# Patient Record
Sex: Male | Born: 1935 | Race: White | Hispanic: No
Health system: Northeastern US, Academic
[De-identification: ages and names within clinical notes are randomized; demographics above are authoritative.]

## PROBLEM LIST (undated history)

## (undated) ENCOUNTER — Encounter

## (undated) DIAGNOSIS — C61 Malignant neoplasm of prostate: Secondary | ICD-10-CM

## (undated) DIAGNOSIS — N189 Chronic kidney disease, unspecified: Secondary | ICD-10-CM

## (undated) DIAGNOSIS — M1990 Unspecified osteoarthritis, unspecified site: Secondary | ICD-10-CM

## (undated) DIAGNOSIS — I499 Cardiac arrhythmia, unspecified: Secondary | ICD-10-CM

## (undated) DIAGNOSIS — C419 Malignant neoplasm of bone and articular cartilage, unspecified: Secondary | ICD-10-CM

## (undated) DIAGNOSIS — E785 Hyperlipidemia, unspecified: Secondary | ICD-10-CM

## (undated) DIAGNOSIS — I10 Essential (primary) hypertension: Secondary | ICD-10-CM

## (undated) DIAGNOSIS — R2681 Unsteadiness on feet: Secondary | ICD-10-CM

## (undated) DIAGNOSIS — I4891 Unspecified atrial fibrillation: Secondary | ICD-10-CM

## (undated) DIAGNOSIS — I429 Cardiomyopathy, unspecified: Secondary | ICD-10-CM

## (undated) HISTORY — DX: Cardiac arrhythmia, unspecified: I49.9

## (undated) HISTORY — DX: Unspecified atrial fibrillation: I48.91

## (undated) HISTORY — DX: Malignant neoplasm of bone and articular cartilage, unspecified: C41.9

## (undated) HISTORY — DX: Malignant neoplasm of prostate: C61

## (undated) HISTORY — DX: Unsteadiness on feet: R26.81

## (undated) HISTORY — DX: Cardiomyopathy, unspecified: I42.9

## (undated) HISTORY — PX: EYE SURGERY: SHX253

## (undated) HISTORY — PX: PROSTATE SURGERY: SHX751

## (undated) HISTORY — PX: CATARACT EXTRACTION: SUR2

## (undated) HISTORY — PX: WRIST FOREIGN BODY REMOVAL: SUR1120

## (undated) MED FILL — ENZALUTAMIDE 40 MG CAPSULE: 40 40 mg | ORAL | 30 days supply | Qty: 60 | Fill #0 | Status: CN

---

## 1995-03-11 DIAGNOSIS — C61 Malignant neoplasm of prostate: Secondary | ICD-10-CM

## 1995-03-11 HISTORY — DX: Malignant neoplasm of prostate: C61

## 2009-04-02 ENCOUNTER — Emergency Department (HOSPITAL_COMMUNITY): Admission: EM | Admit: 2009-04-02 | Discharge: 2009-04-02 | Payer: Self-pay | Admitting: Emergency Medicine

## 2010-05-27 LAB — DIFFERENTIAL
Eosinophils Absolute: 0 10*3/uL (ref 0.0–0.7)
Eosinophils Relative: 0 % (ref 0–5)
Lymphs Abs: 0.6 10*3/uL — ABNORMAL LOW (ref 0.7–4.0)
Monocytes Absolute: 0.5 10*3/uL (ref 0.1–1.0)

## 2010-05-27 LAB — BASIC METABOLIC PANEL
BUN: 19 mg/dL (ref 6–23)
Chloride: 98 mEq/L (ref 96–112)
Glucose, Bld: 104 mg/dL — ABNORMAL HIGH (ref 70–99)
Potassium: 3.7 mEq/L (ref 3.5–5.1)
Sodium: 137 mEq/L (ref 135–145)

## 2010-05-27 LAB — CBC
HCT: 49.6 % (ref 39.0–52.0)
Hemoglobin: 17.4 g/dL — ABNORMAL HIGH (ref 13.0–17.0)
MCV: 102.1 fL — ABNORMAL HIGH (ref 78.0–100.0)
Platelets: 172 10*3/uL (ref 150–400)
WBC: 9.1 10*3/uL (ref 4.0–10.5)

## 2010-05-27 LAB — URINALYSIS, ROUTINE W REFLEX MICROSCOPIC
Glucose, UA: NEGATIVE mg/dL
Ketones, ur: 40 mg/dL — AB
pH: 6 (ref 5.0–8.0)

## 2012-05-07 ENCOUNTER — Encounter (HOSPITAL_COMMUNITY): Payer: Self-pay | Admitting: Emergency Medicine

## 2012-05-07 ENCOUNTER — Emergency Department (HOSPITAL_COMMUNITY)
Admission: EM | Admit: 2012-05-07 | Discharge: 2012-05-07 | Disposition: A | Discharge status: Home / Self Care | Payer: Medicare Other | Attending: Emergency Medicine | Admitting: Emergency Medicine

## 2012-05-07 DIAGNOSIS — M545 Low back pain, unspecified: Secondary | ICD-10-CM | POA: Insufficient documentation

## 2012-05-07 DIAGNOSIS — G8929 Other chronic pain: Secondary | ICD-10-CM | POA: Insufficient documentation

## 2012-05-07 MED ORDER — HYDROCODONE-ACETAMINOPHEN 5-325 MG PO TABS
1.0000 | ORAL_TABLET | Freq: Once | ORAL | Status: AC
  Administered 2012-05-07: 1 via ORAL
  Filled 2012-05-07: qty 1

## 2012-05-07 MED ORDER — HYDROCODONE-ACETAMINOPHEN 5-325 MG PO TABS: 1.0000 | ORAL_TABLET | Freq: Two times a day (BID) | ORAL | Status: DC | PRN

## 2012-05-07 NOTE — ED Notes (Signed)
Pt has PCP in [place]. Has HX of "arthritis" in lower back and right hip which has gotten worse over past 3 days. States his PCP usually gives him Hydrocodone.

## 2012-05-07 NOTE — ED Provider Notes (Signed)
History     CSN: [id number]  Arrival date & time 05/07/12  1154   First MD Initiated Contact with Patient 05/07/12 1206      Chief Complaint  Patient presents with  . Back Pain    (Consider location/radiation/quality/duration/timing/severity/associated sxs/prior treatment) HPI Comments: Patient presents for left side LBP that has been worsening x 3 days. Patient states pain is nonradiating, aching and stabbing in quality, and worse with forward flexion. Patient states he is followed by a doctor in [place] regarding his pain who prescribes him vicodin to take as needed; patient brought the pill bottle with him to confirm this. Patient denies weakness, numbness and tingling in his lower extremities, saddle anesthesia, and loss of bowel or bladder function. Further denies recent trauma to the back.  The history is provided by the patient. No language interpreter was used.    History reviewed. No pertinent past medical history.  History reviewed. No pertinent past surgical history.  History reviewed. No pertinent family history.  History  Substance Use Topics  . Smoking status: Never Smoker   . Smokeless tobacco: Not on file  . Alcohol Use: Yes     Comment: occ     Review of Systems  Constitutional: Negative for fever.  Respiratory: Negative for shortness of breath.   Cardiovascular: Negative for chest pain.  Musculoskeletal: Positive for back pain. Negative for gait problem.  Skin: Negative for wound.  Neurological: Negative for weakness and numbness.  All other systems reviewed and are negative.    Allergies  Review of patient's allergies indicates no known allergies.  Home Medications   Current Outpatient Rx  Name  Route  Sig  Dispense  Refill  . HYDROcodone-acetaminophen (NORCO/VICODIN) 5-325 MG per tablet   Oral   Take 1 tablet by mouth every 12 (twelve) hours as needed for pain.   6 tablet   0     BP 132/72  Pulse 73  Temp(Src) 97.5 F (36.4 C) (Oral)   Resp 16  SpO2 96%  Physical Exam  Nursing note and vitals reviewed. Constitutional: He is oriented to person, place, and time. He appears well-developed and well-nourished. No distress.  HENT:  Head: Normocephalic and atraumatic.  Eyes: Conjunctivae are normal. No scleral icterus.  Neck: Normal range of motion.  Cardiovascular: Intact distal pulses.   Pulmonary/Chest: Effort normal.  Musculoskeletal: He exhibits tenderness. He exhibits no edema.       Right hip: Normal.       Left hip: Normal.       Thoracic back: Normal.       Lumbar back: He exhibits tenderness. He exhibits normal range of motion, no bony tenderness, no swelling, no edema, no deformity and no laceration.       Back:  Tenderness on palpation appreciated at sight indicated. No midline tenderness, step offs, or evidence of trauma appreciated.   Neurological: He is alert and oriented to person, place, and time. He has normal strength. He displays no atrophy. No sensory deficit. He exhibits normal muscle tone.  Skin: Skin is warm and dry. No rash noted. He is not diaphoretic. No erythema. No pallor.  Psychiatric: He has a normal mood and affect. His behavior is normal.    ED Course  Procedures (including critical care time)  Labs Reviewed - No data to display No results found.   1. Chronic back pain      MDM  Patient presents to ED with chronic low back pain that has been worse  over the last 3 days. Patient followed by doctor in [place] who prescribed Vicodin 60 tabs to take as needed for pain ever 6 hours; has run out of Rx and will have new Rx called into pharmacy when office re-opens on Monday.   Patient exhibits tenderness in his left lower back. No midline tenderness or findings which would warrant further work up with imaging. Will d/c patient with 6 tabs vicodin for pain to take as needed. Have discussed the risk associated with taking narcotic pain medication for chronic back pain and have recommended  follow up with PCP doctor who manages his back pain as soon as possible. Have verbalized that pain medication is not treating the underlying source of his back pain and strongly suggested further evaluation with PT for back muscle strengthening as well as water aerobics for overall muscle strengthening of the back. Patient verbalizes understanding and comfort with this plan.  Filed Vitals:   05/07/12 1159  BP: 132/72  Pulse: 73  Temp: 97.5 F (36.4 C)  TempSrc: Oral  Resp: 16  SpO2: 96%         Antony Madura, PA-C 05/10/12 1613

## 2012-05-07 NOTE — ED Notes (Signed)
Pt c/o right lower back pain chronic in nature that is worse over last three days; pt denies new injury

## 2012-05-12 NOTE — ED Provider Notes (Signed)
Medical screening examination/treatment/procedure(s) were performed by non-physician practitioner and as supervising physician I was immediately available for consultation/collaboration.  Christopher J. Pollina, MD 05/12/12 0807 

## 2014-01-23 ENCOUNTER — Ambulatory Visit: Payer: Medicare Other | Admitting: Family Medicine

## 2015-02-13 ENCOUNTER — Emergency Department (HOSPITAL_COMMUNITY)
Admission: EM | Admit: 2015-02-13 | Discharge: 2015-02-13 | Disposition: A | Discharge status: Home / Self Care | Payer: Medicare Other | Attending: Emergency Medicine | Admitting: Emergency Medicine

## 2015-02-13 ENCOUNTER — Encounter (HOSPITAL_COMMUNITY): Payer: Self-pay

## 2015-02-13 DIAGNOSIS — B029 Zoster without complications: Secondary | ICD-10-CM | POA: Insufficient documentation

## 2015-02-13 DIAGNOSIS — G510 Bell's palsy: Secondary | ICD-10-CM | POA: Diagnosis not present

## 2015-02-13 DIAGNOSIS — H9201 Otalgia, right ear: Secondary | ICD-10-CM | POA: Diagnosis not present

## 2015-02-13 DIAGNOSIS — B0221 Postherpetic geniculate ganglionitis: Secondary | ICD-10-CM | POA: Insufficient documentation

## 2015-02-13 DIAGNOSIS — Z8546 Personal history of malignant neoplasm of prostate: Secondary | ICD-10-CM | POA: Insufficient documentation

## 2015-02-13 DIAGNOSIS — Z8639 Personal history of other endocrine, nutritional and metabolic disease: Secondary | ICD-10-CM | POA: Diagnosis not present

## 2015-02-13 DIAGNOSIS — R220 Localized swelling, mass and lump, head: Secondary | ICD-10-CM | POA: Diagnosis present

## 2015-02-13 DIAGNOSIS — M1990 Unspecified osteoarthritis, unspecified site: Secondary | ICD-10-CM | POA: Insufficient documentation

## 2015-02-13 HISTORY — DX: Hyperlipidemia, unspecified: E78.5

## 2015-02-13 HISTORY — DX: Malignant neoplasm of prostate: C61

## 2015-02-13 HISTORY — DX: Unspecified osteoarthritis, unspecified site: M19.90

## 2015-02-13 MED ORDER — PREDNISONE 10 MG (21) PO TBPK: 10.0000 mg | ORAL_TABLET | Freq: Every day | ORAL | Status: DC

## 2015-02-13 MED ORDER — PREDNISONE 20 MG PO TABS
60.0000 mg | ORAL_TABLET | Freq: Once | ORAL | Status: AC
Start: 2015-02-13 — End: 2015-02-13
  Administered 2015-02-13: 60 mg via ORAL
  Filled 2015-02-13: qty 3

## 2015-02-13 MED ORDER — SULFAMETHOXAZOLE-TRIMETHOPRIM 800-160 MG PO TABS: 1.0000 | ORAL_TABLET | Freq: Two times a day (BID) | ORAL | Status: DC

## 2015-02-13 MED ORDER — VALACYCLOVIR HCL 1 G PO TABS: 1000.0000 mg | ORAL_TABLET | Freq: Three times a day (TID) | ORAL | Status: DC

## 2015-02-13 MED ORDER — TRAMADOL HCL 50 MG PO TABS: 50.0000 mg | ORAL_TABLET | Freq: Four times a day (QID) | ORAL | Status: DC | PRN

## 2015-02-13 MED ORDER — SULFAMETHOXAZOLE-TRIMETHOPRIM 800-160 MG PO TABS: 1.0000 | ORAL_TABLET | Freq: Once | ORAL | Status: DC

## 2015-02-13 MED ORDER — VALACYCLOVIR HCL 500 MG PO TABS
1000.0000 mg | ORAL_TABLET | Freq: Once | ORAL | Status: AC
  Administered 2015-02-13: 1000 mg via ORAL
  Filled 2015-02-13: qty 2

## 2015-02-13 NOTE — ED Provider Notes (Signed)
Medical screening examination/treatment/procedure(s) were conducted as a shared visit with non-physician practitioner(s) and myself.  I personally evaluated the patient during the encounter.   EKG Interpretation None       See the written copy of this report in the patient's paper medical record.  These results did not interface directly into the electronic medical record and are summarized here.  81 yo M with a chief complaints of a painful rash to the right side of his face. This been going on for the past 48 hours. Patient denies any injury to the area. On exam patient has a vesicular rash in a dermatomal distribution. There is a rash extends into his ear. 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 Patient also has Bell's palsy on the right. Will start acyclovir steroids. D/c home.   Deno Etienne, DO 02/13/15 2335

## 2015-02-13 NOTE — Discharge Instructions (Signed)
Shingles Shingles, which is also known as herpes zoster, is an infection that causes a painful skin rash and fluid-filled blisters. Shingles is not related to genital herpes, which is a sexually transmitted infection.   Shingles only develops in people who:  Have had chickenpox.  Have received the chickenpox vaccine. (This is rare.) CAUSES Shingles is caused by varicella-zoster virus (VZV). This is the same virus that causes chickenpox. After exposure to VZV, the virus stays in the body in an inactive (dormant) state. Shingles develops if the virus reactivates. This can happen many years after the initial exposure to VZV. It is not known what causes this virus to reactivate. RISK FACTORS People who have had chickenpox or received the chickenpox vaccine are at risk for shingles. Infection is more common in people who:  Are older than age 44.  Have a weakened defense (immune) system, such as those with HIV, AIDS, or cancer.  Are taking medicines that weaken the immune system, such as transplant medicines.  Are under great stress. SYMPTOMS Early symptoms of this condition include itching, tingling, and pain in an area on your skin. Pain may be described as burning, stabbing, or throbbing. A few days or weeks after symptoms start, a painful red rash appears, usually on one side of the body in a bandlike or beltlike pattern. The rash eventually turns into fluid-filled blisters that break open, scab over, and dry up in about 2-3 weeks. At any time during the infection, you may also develop:  A fever.  Chills.  A headache.  An upset stomach. DIAGNOSIS This condition is diagnosed with a skin exam. Sometimes, skin or fluid samples are taken from the blisters before a diagnosis is made. These samples are examined under a microscope or sent to a lab for testing. TREATMENT There is no specific cure for this condition. Your health care provider will probably prescribe medicines to help you  manage pain, recover more quickly, and avoid long-term problems. Medicines may include:  Antiviral drugs.  Anti-inflammatory drugs.  Pain medicines. If the area involved is on your face, you may be referred to a specialist, such as an eye doctor (ophthalmologist) or an ear, nose, and throat (ENT) doctor to help you avoid eye problems, chronic pain, or disability. HOME CARE INSTRUCTIONS Medicines  Take medicines only as directed by your health care provider.  Apply an anti-itch or numbing cream to the affected area as directed by your health care provider. Blister and Rash Care  Take a cool bath or apply cool compresses to the area of the rash or blisters as directed by your health care provider. This may help with pain and itching.  Keep your rash covered with a loose bandage (dressing). Wear loose-fitting clothing to help ease the pain of material rubbing against the rash.  Keep your rash and blisters clean with mild soap and cool water or as directed by your health care provider.  Check your rash every day for signs of infection. These include redness, swelling, and pain that lasts or increases.  Do not pick your blisters.  Do not scratch your rash. General Instructions  Rest as directed by your health care provider.  Keep all follow-up visits as directed by your health care provider. This is important.  Until your blisters scab over, your infection can cause chickenpox in people who have never had it or been vaccinated against it. To prevent this from happening, avoid contact with other people, especially:  Babies.  Pregnant women.  Children who have eczema.  Elderly people who have transplants.  People who have chronic illnesses, such as leukemia or AIDS. SEEK MEDICAL CARE IF:  Your pain is not relieved with prescribed medicines.  Your pain does not get better after the rash heals.  Your rash looks infected. Signs of infection include redness, swelling, and pain  that lasts or increases. SEEK IMMEDIATE MEDICAL CARE IF:  The rash is on your face or nose.  You have facial pain, pain around your eye area, or loss of feeling on one side of your face.  You have ear pain or you have ringing in your ear.  You have loss of taste.  Your condition gets worse.   This information is not intended to replace advice given to you by your health care provider. Make sure you discuss any questions you have with your health care provider.   Document Released: 02/24/2005 Document Revised: 03/17/2014 Document Reviewed: 01/05/2014 Elsevier Interactive Patient Education 2016 [employer]. Emergency Department Resource Guide 1) Find a Doctor and Pay Out of Pocket Although you won't have to find out who is covered by your insurance plan, it is a good idea to ask around and get recommendations. You will then need to call the office and see if the doctor you have chosen will accept you as a new patient and what types of options they offer for patients who are self-pay. Some doctors offer discounts or will set up payment plans for their patients who do not have insurance, but you will need to ask so you aren't surprised when you get to your appointment.  2) Contact Your Local Health Department Not all health departments have doctors that can see patients for sick visits, but many do, so it is worth a call to see if yours does. If you don't know where your local health department is, you can check in your phone book. The CDC also has a tool to help you locate your state's health department, and many state websites also have listings of all of their local health departments.  3) Find a [facility] If your illness is not likely to be very severe or complicated, you may want to try a walk in clinic. These are popping up all over the country in pharmacies, drugstores, and shopping centers. They're usually staffed by nurse practitioners or physician assistants that have been  trained to treat common illnesses and complaints. They're usually fairly quick and inexpensive. However, if you have serious medical issues or chronic medical problems, these are probably not your best option.  No Primary Care Doctor: - Call Health Connect at  [phone] - they can help you locate a primary care doctor that  accepts your insurance, provides certain services, etc. - Physician Referral Service- [phone]  Chronic Pain Problems: Organization         Address  Phone   Notes  [facility]  [phone] Patients need to be referred by their primary care doctor.   Medication Assistance: Organization         Address  Phone   Notes  [facility] Medication [facility] [address] [place], [place] [address] [phone] --Must be a resident of [facility] -- Must have NO insurance coverage whatsoever (no Medicaid/ Medicare, etc.) -- The pt. MUST have a primary care doctor that directs their care regularly and follows them in the community   MedAssist  [phone]   [employer]  [phone])  [id number]    Agencies that provide inexpensive medical care: Organization         Address  Phone   Notes  [place]  [phone]   [facility]    [phone]   [facility] [place], [place] [address] [phone]   [place] [address] [place] [phone]   Planned Parenthood    [phone]   [facility]    [phone]   [place] and [place] [address], [place] Phone:  [phone], Fax:  [phone] Hours of Operation:  9 am - 6 pm, M-F.  Also accepts Medicaid/Medicare and self-pay.  [facility] for [place], [address], [place] Phone: [phone], Fax: [phone]. Hours of Operation:  8:30 am - 5:30 pm, M-F.  Also accepts Medicaid and self-pay.   [facility] [place] [address] [place] Phone: [phone]   [place], [place], [place] [phone], Ext. [phone] Mondays & Thursdays: 7-9 AM.  First 15 patients are seen on a first come, first serve basis.    [place] Providers:  Organization         Address  Phone   Notes  [facility] [address], [place] [phone] Also accepts self-pay patients.  [facility] [id number] [place], [place]  [phone]   [place], [address], [place] [phone]   [facility] [address], [place] [phone]   Lucianne Lei [address], [place]   [phone] Only accepts [place] Access [place] patients after they have their name applied to their card.   Self-Pay (no insurance) in [facility]:  Organization         Address  Phone   Notes  Sickle Cell Patients, [facility] [place] [phone]   [facility] [place] [phone]   [facility] [place]  [place], [place], [place] [phone]   [facility]/Dr. Osei-Bonsu  [address] [place] or [address], [place] [phone] Phone number for both [place] and [place] locations is the same.  Urgent Medical and [facility] [place] [address], [place] [phone]   [facility] [address], [place] or [address] [phone]   [facility] [address], [place] [phone], phone; [phone], fax Sees patients 1st and 3rd Saturday of every month.  Must not qualify for public or private insurance (i.e. Medicaid, Medicare, [place] Health Choice, Veterans' Benefits)  Household income should be no  more than 200% of the poverty level The clinic cannot treat you if you are pregnant or think you are pregnant  Sexually transmitted diseases are not treated at the clinic.    Dental Care: Organization         Address  Phone  Notes  [facility] Department of [facility] [name] [phone] Accepts children up to age 3 who are enrolled in [place] or [place]; pregnant women with a Medicaid card; and children who have  applied for Medicaid or [place] Health Choice, but were declined, whose parents can pay a reduced fee at time of service.  [facility] : [facility] Department of [facility]  [address], [place] [phone] Accepts children up to age 50 who are enrolled in [place] or [place]; pregnant women with a Medicaid card; and children who have applied for Medicaid or [place] Health Choice, but were declined, whose parents can pay a reduced fee at time of service.  [place] Adult Dental Access PROGRAM  [facility] [phone] Patients are seen by appointment only. Walk-ins are not accepted. [facility] will see patients 34 years of age and older. Monday - Tuesday (8am-5pm) Most Wednesdays (8:30-5pm) $30 per visit, cash only  [facility] Adult Dental Access PROGRAM  [address], [facility] [phone] Patients are seen by appointment only. Walk-ins are not accepted. [place] will see patients 28 years of age and older. One Wednesday Evening (Monthly: Volunteer Based).  $30 per visit, cash only  [place]  [phone] for adults; Children under age 7, call Graduate Pediatric Dentistry at [phone]. Children aged 42-14, please call [phone] to request a pediatric application.  Dental services are provided in all areas of dental care including fillings, crowns and bridges, complete and partial dentures, implants, gum treatment, root canals,  and extractions. Preventive care is also provided. Treatment is provided to both adults and children. Patients are selected via a lottery and there is often a waiting list.   [facility] [address], [place]  [phone] [email or web address]   [facility] [address] [place], [place] [phone], Ext. [phone] Second and Fourth Thursday of each month, opens at 6:30 AM; Clinic ends at 9 AM.  Patients are seen on a first-come first-served basis, and a limited number are seen during each clinic.   [facility]  [address] [place], [place] [phone]   Eligibility Requirements You must have lived in [place], [place], or [place] counties for at least the last three months.   You cannot be eligible for state or federal sponsored [employer], including [employer], [place], or [employer].   You generally cannot be eligible for healthcare insurance through your employer.    How to apply: Eligibility screenings are held every Tuesday and Wednesday afternoon from 1:00 pm until 4:00 pm. You do not need an appointment for the interview!  [facility] [address] [place], [place]   [place]  [place] Department  [place]  [phone]    Behavioral Health Resources in the Community: Intensive Outpatient Programs Organization         Address  Phone  Notes  [name]. [address], [place], [place] [phone]   [facility] Outpatient [address] [place], [place]   ADS: Alcohol & Drug Svcs [address] [place], [place]   [place] [address]  [place], [place] or [phone]   Substance Abuse Resources Organization         Address  Phone  Notes  Alcohol and Drug Services  [place]  [phone]   [facility] [place]   Chinita Pester  [phone]   Residential & Outpatient Substance  Abuse Program  [phone]   Psychological Services Organization         Address  Phone  Notes  [place]  [place]  [phone]   [place] [address], [place] or [phone]    Mobile Crisis Teams Organization         Address  Phone  Notes  [facility], Mobile Crisis Care Unit  [phone]   [facility] Psychotherapeutic Services  [address] [place], [place]   [place] Levels [address], [place] [phone]    Self-Help/Support Groups Organization         Address  Phone             Notes  [place]. of [place] - variety of support groups  [place] Call for more information  Narcotics Anonymous (NA), [facility] [address], [employer] [place]  2 meetings at this location   Special educational needs teacher         Address  Phone  Notes  ASAP Residential Treatment [place],    [place]  1-6200080474   [facility]  [address], [place] [id number], [place], [place]   [place], [place] [phone] Admissions: 8am-3pm M-F  Incentives Substance [place] [address],    [place], [place] [phone]   [facility] [address] [place], [place], [place]   [facility] [address]  [place], [place]   Insight Programs - Intensive Outpatient [address], [place], [place]   [facility] ([place].) [place].,  [place], [place] [phone] or [phone]   [facility]idential Treatment Services (RTS) [address], [place], [place] Accepts Medicaid  [facility] [place] [address]    [place] [phone] Substance Abuse/Addiction Treatment   [facility] Organization         Address  Phone  Notes  [facility]  [phone]   Domenic Schwab, PhD [address] [place], [place]   [phone] or [phone]   [place], [place] [phone]   [facility] [address], [place], [place] [phone] Insurance/Medicaid/sponsorship through [facility] and Families [address], [address]                                    [place], [place] [phone] [place] [address] [place], [place] [phone]    Dr. Adele Schilder  [phone]   [facility] Dept. 1) [address] [place] 2) [place] 3)  [place] [address], [place] [phone]  [phone]   [place] [phone] or [phone] (After Hours)

## 2015-02-13 NOTE — ED Notes (Signed)
Pt c/o pain and swelling to upper lip and right ear pain

## 2015-02-13 NOTE — ED Notes (Signed)
Pt c/o infection to right side of nose x 2 days. Swelling and redness noted above r side of upper lip

## 2015-02-13 NOTE — ED Provider Notes (Signed)
CSN: [id number]     Arrival date & time 02/13/15  1656 History  By signing my name below, I, Hansel Feinstein, attest that this documentation has been prepared under the direction and in the presence of  [employer], PA-C. Electronically Signed: Hansel Feinstein, ED Scribe. 02/13/2015. 6:41 PM.     Chief Complaint  Patient presents with  . Wound Infection    The history is provided by the patient. No language interpreter was used.   HPI Comments: Ved Mackellar is a 81 y.o. male with h/o HLD, prostate cancer who presents to the Emergency Department complaining of an area of redness, swelling and pain proximal to the right side of the nose and right upper lip onset 2 days ago. He states associated right-sided otalgia. Pt denies taking OTC medications at home to improve symptoms. No Hx of similar symptoms to the face. No h/o DM. NKDA. He denies fever, drainage from the affected area.  Denies any difference in taste.  Past Medical History  Diagnosis Date  . Cancer ([place])   . Prostate cancer ([place])   . Hyperlipidemia   . Arthritis   . Prostate cancer [facility])    Past Surgical History  Procedure Laterality Date  . Cataract extraction     History reviewed. No pertinent family history. Social History  Substance Use Topics  . Smoking status: Never Smoker   . Smokeless tobacco: None  . Alcohol Use: No     Comment: occ    Review of Systems  Constitutional: Negative for fever.  HENT: Positive for ear pain.   Skin: Positive for color change.       Area of pain, redness and swelling to the right upper lip without drainage   Allergies  Review of patient's allergies indicates no known allergies.  Home Medications   Prior to Admission medications   Medication Sig Start Date End Date Taking? Authorizing Provider  HYDROcodone-acetaminophen (NORCO/VICODIN) 5-325 MG per tablet Take 1 tablet by mouth every 12 (twelve) hours as needed for pain. 05/07/12   Antonietta Breach, PA-C  predniSONE (STERAPRED  UNI-PAK 21 TAB) 10 MG (21) TBPK tablet Take 1 tablet (10 mg total) by mouth daily. Take 6 tabs by mouth daily  for 2 days, then 5 tabs for 2 days, then 4 tabs for 2 days, then 3 tabs for 2 days, 2 tabs for 2 days, then 1 tab by mouth daily for 2 days 02/13/15   Ottie Glazier, PA-C  traMADol (ULTRAM) 50 MG tablet Take 1 tablet (50 mg total) by mouth every 6 (six) hours as needed. 02/13/15   Tashayla Therien Patel-Mills, PA-C  valACYclovir (VALTREX) 1000 MG tablet Take 1 tablet (1,000 mg total) by mouth 3 (three) times daily. 02/13/15   Kennie Snedden Patel-Mills, PA-C   BP 153/70 mmHg  Pulse 68  Temp(Src) 98.2 F (36.8 C) (Oral)  Resp 18  Ht 5\' 7"  (1.702 m)  Wt 79.606 kg  BMI 27.48 kg/m2  SpO2 99% Physical Exam  Constitutional: He is oriented to person, place, and time. He appears well-developed and well-nourished.  HENT:  Head: Normocephalic and atraumatic.  Right Ear: Hearing, tympanic membrane, external ear and ear canal normal. Tympanic membrane is not erythematous. No hemotympanum.  Left Ear: Hearing, tympanic membrane, external ear and ear canal normal. Tympanic membrane is not erythematous. No hemotympanum.  Mouth/Throat: Uvula is midline, oropharynx is clear and moist and mucous membranes are normal.  Wearing dentures. No gum swelling.  Eyes: Conjunctivae and EOM are normal. Pupils are equal,  round, and reactive to light.  Neck: Normal range of motion. Neck supple.  Cardiovascular: Normal rate.   Pulmonary/Chest: Effort normal. No respiratory distress.  Abdominal: He exhibits no distension.  Musculoskeletal: Normal range of motion.  Neurological: He is alert and oriented to person, place, and time. He has normal strength. No sensory deficit. GCS eye subscore is 4. GCS verbal subscore is 5. GCS motor subscore is 6.  Ambulatory with steady gait. No sensory deficit. No motor deficit. Normal coordination. GCS 15. Right-sided facial palsy. No slurred speech.  Skin: Skin is warm and dry.  Dermatomal  vesicular rash along the right upper lip toward the right ear. No lesions noted within the year or on the pinna. 1 crusted yellow vesicle above the right upper lip.  Psychiatric: He has a normal mood and affect. His behavior is normal.  Nursing note and vitals reviewed.  ED Course  Procedures (including critical care time) DIAGNOSTIC STUDIES: Oxygen Saturation is 96% on RA, adequate by my interpretation.    COORDINATION OF CARE: 6:37 PM Discussed treatment plan with pt at bedside and pt agreed to plan. Plan to treat with antibiotics and steroids. I spoke to Dr. Tyrone Nine who has seen and evaluated the patient. MDM   Final diagnoses:  Shingles rash  Bell's palsy   Patient with swelling and yellow crusting to his upper lip without surrounding erythema. This is most consistent with shingles. I do not believe this is a stroke. Supportive care and return precautions discussed.  Pt sent home with Valtrex, Tramadol and Prednisone. The patient appears reasonably screened and/or stabilized for discharge and I doubt any other emergent medical condition requiring further screening, evaluation, or treatment in the ED prior to discharge.    Filed Vitals:   02/13/15 1725 02/13/15 1910  BP: 151/96 153/70  Pulse: 69 68  Temp: 97.7 F (36.5 C) 98.2 F (36.8 C)  Resp: 16 18    Medications  valACYclovir (VALTREX) tablet 1,000 mg (1,000 mg Oral Given 02/13/15 1947)  predniSONE (DELTASONE) tablet 60 mg (60 mg Oral Given 02/13/15 1921)    Discharge Medication List as of 02/13/2015  7:12 PM    START taking these medications   Details  predniSONE (STERAPRED UNI-PAK 21 TAB) 10 MG (21) TBPK tablet Take 1 tablet (10 mg total) by mouth daily. Take 6 tabs by mouth daily  for 2 days, then 5 tabs for 2 days, then 4 tabs for 2 days, then 3 tabs for 2 days, 2 tabs for 2 days, then 1 tab by mouth daily for 2 days, Starting 02/13/2015, Until Discontinued, P rint    traMADol (ULTRAM) 50 MG tablet Take 1 tablet (50 mg  total) by mouth every 6 (six) hours as needed., Starting 02/13/2015, Until Discontinued, Print    valACYclovir (VALTREX) 1000 MG tablet Take 1 tablet (1,000 mg total) by mouth 3 (three) times daily., Starting 02/13/2015, Until Discontinued, Print       I personally performed the services described in this documentation, which was scribed in my presence. The recorded information has been reviewed and is accurate.   Ottie Glazier, PA-C 02/13/15 [place], DO 02/13/15 2335

## 2015-03-01 ENCOUNTER — Ambulatory Visit (INDEPENDENT_AMBULATORY_CARE_PROVIDER_SITE_OTHER): Payer: Medicare Other | Admitting: Internal Medicine

## 2015-03-01 ENCOUNTER — Encounter: Payer: Self-pay | Admitting: Internal Medicine

## 2015-03-01 VITALS — BP 132/78 | HR 76 | Temp 97.7°F | Resp 16 | Ht 67.0 in | Wt 177.8 lb

## 2015-03-01 DIAGNOSIS — K219 Gastro-esophageal reflux disease without esophagitis: Secondary | ICD-10-CM

## 2015-03-01 DIAGNOSIS — R7309 Other abnormal glucose: Secondary | ICD-10-CM

## 2015-03-01 DIAGNOSIS — E782 Mixed hyperlipidemia: Secondary | ICD-10-CM | POA: Insufficient documentation

## 2015-03-01 DIAGNOSIS — R739 Hyperglycemia, unspecified: Secondary | ICD-10-CM

## 2015-03-01 DIAGNOSIS — R7303 Prediabetes: Secondary | ICD-10-CM | POA: Insufficient documentation

## 2015-03-01 DIAGNOSIS — Z79899 Other long term (current) drug therapy: Secondary | ICD-10-CM | POA: Insufficient documentation

## 2015-03-01 DIAGNOSIS — E559 Vitamin D deficiency, unspecified: Secondary | ICD-10-CM | POA: Insufficient documentation

## 2015-03-01 DIAGNOSIS — I10 Essential (primary) hypertension: Secondary | ICD-10-CM

## 2015-03-01 LAB — BASIC METABOLIC PANEL WITH GFR
BUN: 27 mg/dL — AB (ref 7–25)
CHLORIDE: 97 mmol/L — AB (ref 98–110)
CO2: 28 mmol/L (ref 20–31)
Calcium: 9.3 mg/dL (ref 8.6–10.3)
Creat: 1.24 mg/dL — ABNORMAL HIGH (ref 0.70–1.11)
GFR, EST AFRICAN AMERICAN: 63 mL/min (ref >=60)
GFR, EST NON AFRICAN AMERICAN: 54 mL/min — AB (ref >=60)
GLUCOSE: 128 mg/dL — AB (ref 65–99)
POTASSIUM: 3.9 mmol/L (ref 3.5–5.3)
Sodium: 135 mmol/L (ref 135–146)

## 2015-03-01 LAB — HEPATIC FUNCTION PANEL
ALBUMIN: 3.6 g/dL (ref 3.6–5.1)
ALK PHOS: 66 U/L (ref 40–115)
ALT: 68 U/L — ABNORMAL HIGH (ref 9–46)
AST: 35 U/L (ref 10–35)
BILIRUBIN INDIRECT: 0.7 mg/dL (ref 0.2–1.2)
BILIRUBIN TOTAL: 0.9 mg/dL (ref 0.2–1.2)
Bilirubin, Direct: 0.2 mg/dL (ref <=0.2)
TOTAL PROTEIN: 6.2 g/dL (ref 6.1–8.1)

## 2015-03-01 LAB — LIPID PANEL
Cholesterol: 178 mg/dL (ref 125–200)
HDL: 47 mg/dL (ref >=40)
TRIGLYCERIDES: 584 mg/dL — AB (ref <150)
Total CHOL/HDL Ratio: 3.8 Ratio (ref <=5.0)

## 2015-03-01 LAB — CBC WITH DIFFERENTIAL/PLATELET
BASOS ABS: 0 10*3/uL (ref 0.0–0.1)
Basophils Relative: 0 % (ref 0–1)
Eosinophils Absolute: 0.1 10*3/uL (ref 0.0–0.7)
Eosinophils Relative: 1 % (ref 0–5)
HEMATOCRIT: 42.1 % (ref 39.0–52.0)
HEMOGLOBIN: 14.7 g/dL (ref 13.0–17.0)
LYMPHS ABS: 1.4 10*3/uL (ref 0.7–4.0)
LYMPHS PCT: 15 % (ref 12–46)
MCH: 34.3 pg — ABNORMAL HIGH (ref 26.0–34.0)
MCHC: 34.9 g/dL (ref 30.0–36.0)
MCV: 98.1 fL (ref 78.0–100.0)
MPV: 9.8 fL (ref 8.6–12.4)
Monocytes Absolute: 0.9 10*3/uL (ref 0.1–1.0)
Monocytes Relative: 10 % (ref 3–12)
NEUTROS ABS: 6.7 10*3/uL (ref 1.7–7.7)
NEUTROS PCT: 74 % (ref 43–77)
Platelets: 147 10*3/uL — ABNORMAL LOW (ref 150–400)
RBC: 4.29 MIL/uL (ref 4.22–5.81)
RDW: 15.2 % (ref 11.5–15.5)
WBC: 9.1 10*3/uL (ref 4.0–10.5)

## 2015-03-01 LAB — MAGNESIUM: Magnesium: 2.1 mg/dL (ref 1.5–2.5)

## 2015-03-01 NOTE — Patient Instructions (Signed)

## 2015-03-02 LAB — HEMOGLOBIN A1C
Hgb A1c MFr Bld: 6.3 % — ABNORMAL HIGH (ref <5.7)
Mean Plasma Glucose: 134 mg/dL — ABNORMAL HIGH (ref <117)

## 2015-03-02 LAB — VITAMIN D 25 HYDROXY (VIT D DEFICIENCY, FRACTURES): Vit D, 25-Hydroxy: 69 ng/mL (ref 30–100)

## 2015-03-02 LAB — INSULIN, RANDOM: Insulin: 32.7 u[IU]/mL — ABNORMAL HIGH (ref 2.0–19.6)

## 2015-03-02 LAB — TSH: TSH: 1.711 u[IU]/mL (ref 0.350–4.500)

## 2015-03-05 ENCOUNTER — Encounter: Payer: Self-pay | Admitting: Internal Medicine

## 2015-03-05 NOTE — Progress Notes (Signed)
Patient ID: Logan Brown, male   DOB: 23-Apr-1931, 81 y.o.   MRN: [id number]     This very nice 81 y.o. DWM from [place] who who for the last 9-10 years resides in [place] about 6 months during the winter, but has never established medical care here.  He presents with a hx/o Hypertension, Hyperlipidemia, Pre-Diabetes and Vitamin D Deficiency. He also reports hx consistent with GERD usually triggered by dietary indiscretions and controlled with OTC antacids. He reports his PCP in [place] is a Dr Loyce Dys. Patient relates hx/o B CE/IOL and has refused Colonoscopy in the past     Patient also was dx'd & tx'd on Dec 6 at [facility] ER with R face Shingles/Ramsey Hunt Syndrome and appears to already recovered his R Bell's Palsy.      Patient relates hx/o labile HTN monitored since 2005 & BP has been controlled and today's BP: 132/78 mmHg. Patient has had no complaints of any cardiac type chest pain, palpitations, dyspnea/orthopnea/PND, dizziness, claudication, or dependent edema.     He also admits hx/o Hyperlipidemia, but is uncertain if it's been controlled with diet.  Current Lipids were are at goal with Cholesterol 178; HDL 47; LDL NOT CALC; but very elevated Triglycerides 584.     Also, the patient is uncertain if he's had hx/o elevated glucoses, but denies symptoms of reactive hypoglycemia, diabetic polys, paresthesias or visual blurring. Current A1c is elevated at 6.3% consistent with PreDiabetes.     Further, the patient has current vitamin D at goal with a level of 69.     Medication Sig  . HYDROcodone-acetaminophen (NORCO/VICODIN) 5-325 MG per tablet Take 1 tablet by mouth every 12 (twelve) hours as needed for pain.  . predniSONE (STERAPRED UNI-PAK 21 TAB) 10 MG (21) TBPK tablet Take 1 tablet (10 mg total) by mouth daily. Take 6 tabs by mouth daily  for 2 days, then 5 tabs for 2 days, then 4 tabs for 2 days, then 3 tabs for 2 days, 2 tabs for 2 days, then 1 tab by mouth daily for 2  days  . traMADol (ULTRAM) 50 MG tablet Take 1 tablet (50 mg total) by mouth every 6 (six) hours as needed.  . valACYclovir (VALTREX) 1000 MG tablet Take 1 tablet (1,000 mg total) by mouth 3 (three) times daily.   No Known Allergies  PMHx:   Past Medical History  Diagnosis Date  . Cancer ([place])   . Prostate cancer ([place])   . Hyperlipidemia   . Arthritis   . Prostate cancer [facility])    Past Surgical History  Procedure Laterality Date  . Cataract extraction     FHx:    Reviewed / unchanged  SHx:    Reviewed / unchanged  Systems Review:  Constitutional: Denies fever, chills, wt changes, headaches, insomnia, fatigue, night sweats, change in appetite. Eyes: Denies redness, blurred vision, diplopia, discharge, itchy, watery eyes.  ENT: Denies discharge, congestion, post nasal drip, epistaxis, sore throat, earache, hearing loss, dental pain, tinnitus, vertigo, sinus pain, snoring.  CV: Denies chest pain, palpitations, irregular heartbeat, syncope, dyspnea, diaphoresis, orthopnea, PND, claudication or edema. Respiratory: denies cough, dyspnea, DOE, pleurisy, hoarseness, laryngitis, wheezing.  Gastrointestinal: Denies dysphagia, odynophagia, heartburn, reflux, water brash, abdominal pain or cramps, nausea, vomiting, bloating, diarrhea, constipation, hematemesis, melena, hematochezia  or hemorrhoids. Genitourinary: Denies dysuria, frequency, urgency, nocturia, hesitancy, discharge, hematuria or flank pain. Musculoskeletal: Denies arthralgias, myalgias, stiffness, jt. swelling, pain, limping or strain/sprain.  Skin: Denies pruritus, rash, hives, warts, acne, eczema  or change in skin lesion(s). Neuro: No weakness, tremor, incoordination, spasms, paresthesia or pain. Psychiatric: Denies confusion, memory loss or sensory loss. Endo: Denies change in weight, skin or hair change.  Heme/Lymph: No excessive bleeding, bruising or enlarged lymph nodes.  Physical Exam  BP 132/78 mmHg  Pulse 76   Temp(Src) 97.7 F (36.5 C)  Resp 16  Ht 5\' 7"  (1.702 m)  Wt 177 lb 12.8 oz (80.65 kg)  BMI 27.84 kg/m2  Appears well nourished and in no distress. Eyes: PERRLA, EOMs, conjunctiva no swelling or erythema. Sinuses: No frontal/maxillary tenderness ENT/Mouth: EAC's clear, TM's nl w/o erythema, bulging. Nares clear w/o erythema, swelling, exudates. Oropharynx clear without erythema or exudates. Oral hygiene is good. Tongue normal, non obstructing. Hearing intact.  Neck: Supple. Thyroid nl. Car 2+/2+ without bruits, nodes or JVD. Chest: Respirations nl with BS clear & equal w/o rales, rhonchi, wheezing or stridor.  Cor: Heart sounds normal w/ regular rate and rhythm without sig. murmurs, gallops, clicks, or rubs. Peripheral pulses normal and equal  without edema.  Abdomen: Soft & bowel sounds normal. Non-tender w/o guarding, rebound, hernias, masses, or organomegaly.  Lymphatics: Unremarkable.  Musculoskeletal: Full ROM all peripheral extremities, joint stability, 5/5 strength, and normal gait.  Skin: Warm, dry without exposed rashes, lesions or ecchymosis apparent.  Neuro: Cranial nerves intact, reflexes equal bilaterally. Sensory-motor testing grossly intact. Tendon reflexes grossly intact.  Pysch: Alert & oriented x 3.  Insight and judgement nl & appropriate. No ideations.  Assessment and Plan:  1. Essential hypertension  - TSH  2. Mixed hyperlipidemia  - Lipid panel - TSH  3. Elevated blood sugar  - Hemoglobin A1c - Insulin, random  4. Vitamin D deficiency  - VITAMIN D 25 Hydroxy   5. Gastroesophageal reflux disease   6. Medication management  - CBC with Differential/Platelet - BASIC METABOLIC PANEL WITH GFR - Hepatic function panel - Magnesium   Recommended regular exercise, BP monitoring, weight control, and discussed med and SE's. Recommended labs to assess and monitor clinical status. Further disposition pending results of labs. Over 30 minutes of exam,  counseling, chart review was performed

## 2015-03-14 ENCOUNTER — Encounter: Payer: Self-pay | Admitting: Internal Medicine

## 2015-03-14 ENCOUNTER — Ambulatory Visit (INDEPENDENT_AMBULATORY_CARE_PROVIDER_SITE_OTHER): Payer: Medicare Other | Admitting: Internal Medicine

## 2015-03-14 VITALS — BP 124/66 | HR 78 | Temp 98.2°F | Resp 16 | Ht 67.0 in | Wt 175.0 lb

## 2015-03-14 DIAGNOSIS — B0229 Other postherpetic nervous system involvement: Secondary | ICD-10-CM

## 2015-03-14 MED ORDER — PREDNISONE 20 MG PO TABS
ORAL_TABLET | ORAL | Status: DC
Start: 2015-03-14 — End: 2015-03-27

## 2015-03-14 MED ORDER — GABAPENTIN 100 MG PO CAPS: 100.0000 mg | ORAL_CAPSULE | Freq: Three times a day (TID) | ORAL | Status: DC

## 2015-03-14 NOTE — Progress Notes (Signed)
   Subjective:    Patient ID: Logan Brown, male    DOB: 1931/05/07, 82 y.o.   MRN: [id number]  Headache  Associated symptoms include ear pain and numbness. Pertinent negatives include no coughing, dizziness, fever, photophobia, rhinorrhea, sinus pressure, sore throat or weakness.   Patient presents to the office for evaluation of right sided face pain and headaches which has been going on for the past 3 weeks. He was diagnosed with shingles on 02/13/15 and had some bells palsy secondary to ramsey hunt syndrome. He was given valtrex, prednisone, and hydrocodone.  He reports that since the shingles he has been having some ear pain, right sided headaches, and facial pain.  He reports that he has been having some pain intermittently and sometimes it is very severe.  He reports that he has never had issues with headaches in the past.  He reports that the pain in his head is a constant ache.  Sometimes it is worse than others, but it can be continuous.  He reports that the prednisone had been helping it.  He reports that he is having some tingling and some numbness on the right side of his face.  He does feel mildly off balance.  His mouth is also sore and he could not wear his false teeth.     Review of Systems  Constitutional: Negative for fever, chills and fatigue.  HENT: Positive for ear pain. Negative for congestion, postnasal drip, rhinorrhea, sinus pressure, sore throat and voice change.   Eyes: Negative for photophobia, discharge and visual disturbance.  Respiratory: Negative for cough, shortness of breath and wheezing.   Skin: Negative for rash.  Neurological: Positive for numbness and headaches. Negative for dizziness, speech difficulty and weakness.  Psychiatric/Behavioral: Negative for suicidal ideas, hallucinations, confusion and agitation.       Objective:   Physical Exam  Constitutional: He is oriented to person, place, and time. He appears well-developed and well-nourished. No  distress.  HENT:  Head: Normocephalic.  Mouth/Throat: Oropharynx is clear and moist. No oropharyngeal exudate.  Facial tenderness to palpation.  Symmetric eyebrow raise.  Mild assymetry with smile  Eyes: Conjunctivae are normal. No scleral icterus.  Neck: Normal range of motion. Neck supple. No JVD present. No thyromegaly present.  Cardiovascular: Normal rate, regular rhythm, normal heart sounds and intact distal pulses.  Exam reveals no gallop and no friction rub.   No murmur heard. Pulmonary/Chest: Breath sounds normal. No respiratory distress. He has no wheezes. He has no rales. He exhibits no tenderness.  Abdominal: Soft. Bowel sounds are normal. He exhibits no distension and no mass. There is no tenderness. There is no rebound and no guarding.  Musculoskeletal: Normal range of motion.  Lymphadenopathy:    He has no cervical adenopathy.  Neurological: He is alert and oriented to person, place, and time. He has normal strength. No cranial nerve deficit or sensory deficit. Coordination normal.  Skin: Skin is warm and dry. He is not diaphoretic.  Psychiatric: He has a normal mood and affect. His behavior is normal. Judgment and thought content normal.  Nursing note and vitals reviewed.   Filed Vitals:   03/14/15 0911  BP: 124/66  Pulse: 78  Temp: 98.2 F (36.8 C)  Resp: 16          Assessment & Plan:    1. Post herpetic neuralgia -gabapentin -prednisone -recheck in 2 weeks -precautions to go to ER discussed.

## 2015-03-14 NOTE — Patient Instructions (Signed)
Please start taking gabapentin which is a pain medication to help with the nerve pain that is coming from the shingles infection.  Please start by taking 1 tablet at nighttime for 3 days.  If you are doing well with this and are not experiencing any side effects you may start taking 1 tablet at night time and 1 tablet with breakfast.  You can build up to taking 1 tablet 3 times per day.    Please take the prednisone as it is prescribed.   Go to the ER if you develop vision changes, weakness, slurred speech or any other concerning symptoms along with your headaches.    Gabapentin capsules or tablets What is this medicine? GABAPENTIN (GA ba pen tin) is used to control partial seizures in adults with epilepsy. It is also used to treat certain types of nerve pain. This medicine may be used for other purposes; ask your health care provider or pharmacist if you have questions. What should I tell my health care provider before I take this medicine? They need to know if you have any of these conditions: -kidney disease -suicidal thoughts, plans, or attempt; a previous suicide attempt by you or a family member -an unusual or allergic reaction to gabapentin, other medicines, foods, dyes, or preservatives -pregnant or trying to get pregnant -breast-feeding How should I use this medicine? Take this medicine by mouth with a glass of water. Follow the directions on the prescription label. You can take it with or without food. If it upsets your stomach, take it with food.Take your medicine at regular intervals. Do not take it more often than directed. Do not stop taking except on your doctor's advice. If you are directed to break the 600 or 800 mg tablets in half as part of your dose, the extra half tablet should be used for the next dose. If you have not used the extra half tablet within 28 days, it should be thrown away. A special MedGuide will be given to you by the pharmacist with each prescription and  refill. Be sure to read this information carefully each time. Talk to your pediatrician regarding the use of this medicine in children. Special care may be needed. Overdosage: If you think you have taken too much of this medicine contact a poison control center or emergency room at once. NOTE: This medicine is only for you. Do not share this medicine with others. What if I miss a dose? If you miss a dose, take it as soon as you can. If it is almost time for your next dose, take only that dose. Do not take double or extra doses. What may interact with this medicine? Do not take this medicine with any of the following medications: -other gabapentin products This medicine may also interact with the following medications: -alcohol -antacids -antihistamines for allergy, cough and cold -certain medicines for anxiety or sleep -certain medicines for depression or psychotic disturbances -homatropine; hydrocodone -naproxen -narcotic medicines (opiates) for pain -phenothiazines like chlorpromazine, mesoridazine, prochlorperazine, thioridazine This list may not describe all possible interactions. Give your health care provider a list of all the medicines, herbs, non-prescription drugs, or dietary supplements you use. Also tell them if you smoke, drink alcohol, or use illegal drugs. Some items may interact with your medicine. What should I watch for while using this medicine? Visit your doctor or health care professional for regular checks on your progress. You may want to keep a record at home of how you feel your condition  is responding to treatment. You may want to share this information with your doctor or health care professional at each visit. You should contact your doctor or health care professional if your seizures get worse or if you have any new types of seizures. Do not stop taking this medicine or any of your seizure medicines unless instructed by your doctor or health care professional.  Stopping your medicine suddenly can increase your seizures or their severity. Wear a medical identification bracelet or chain if you are taking this medicine for seizures, and carry a card that lists all your medications. You may get drowsy, dizzy, or have blurred vision. Do not drive, use machinery, or do anything that needs mental alertness until you know how this medicine affects you. To reduce dizzy or fainting spells, do not sit or stand up quickly, especially if you are an older patient. Alcohol can increase drowsiness and dizziness. Avoid alcoholic drinks. Your mouth may get dry. Chewing sugarless gum or sucking hard candy, and drinking plenty of water will help. The use of this medicine may increase the chance of suicidal thoughts or actions. Pay special attention to how you are responding while on this medicine. Any worsening of mood, or thoughts of suicide or dying should be reported to your health care professional right away. Women who become pregnant while using this medicine may enroll in the [facility] Pregnancy Registry by calling [phone]. This registry collects information about the safety of antiepileptic drug use during pregnancy. What side effects may I notice from receiving this medicine? Side effects that you should report to your doctor or health care professional as soon as possible: -allergic reactions like skin rash, itching or hives, swelling of the face, lips, or tongue -worsening of mood, thoughts or actions of suicide or dying Side effects that usually do not require medical attention (report to your doctor or health care professional if they continue or are bothersome): -constipation -difficulty walking or controlling muscle movements -dizziness -nausea -slurred speech -tiredness -tremors -weight gain This list may not describe all possible side effects. Call your doctor for medical advice about side effects. You may report side effects  to FDA at 1-800-FDA-1088. Where should I keep my medicine? Keep out of reach of children. This medicine may cause accidental overdose and death if it taken by other adults, children, or pets. Mix any unused medicine with a substance like cat litter or coffee grounds. Then throw the medicine away in a sealed container like a sealed bag or a coffee can with a lid. Do not use the medicine after the expiration date. Store at room temperature between 15 and 30 degrees C (59 and 86 degrees F). NOTE: This sheet is a summary. It may not cover all possible information. If you have questions about this medicine, talk to your doctor, pharmacist, or health care provider.    2016, Elsevier/Gold Standard. (2013-04-22 15:26:50)  Postherpetic Neuralgia Postherpetic neuralgia (PHN) is nerve pain that occurs after a shingles infection. Shingles is a painful rash that appears on one side of the body, usually on your trunk or face. Shingles is caused by the varicella-zoster virus. This is the same virus that causes chickenpox. In people who have had chickenpox, the virus can resurface years later and cause shingles. You may have PHN if you continue to have pain for 3 months after your shingles rash has gone away. PHN appears in the same area where you had the shingles rash. For most people, PHN goes  away within 1 year.  Getting a vaccination for shingles can prevent PHN. This vaccine is recommended for people older than 50. It may prevent shingles and may also lower your risk of PHN if you do get shingles. CAUSES PHN is caused by damage to your nerves from the varicella-zoster virus. This damage makes your nerves overly sensitive.  RISK FACTORS Aging is the biggest risk factor for developing PHN. Most people who get PHN are older than 69. Other risk factors include:  Having very bad pain before your shingles rash starts.  Having a very bad rash.  Having shingles in the nerve that supplies your face and eye  (trigeminal nerve). SIGNS AND SYMPTOMS Pain is the main symptom of PHN. The pain is often very bad and may be described as stabbing, burning, or feeling like an electric shock. The pain may come and go or may be there all the time. Pain may be triggered by light touches on the skin or changes in temperature. You may have itching along with the pain. DIAGNOSIS  Your health care provider may diagnose PHN based on your symptoms and your history of shingles. Lab studies and other diagnostic tests are usually not needed. TREATMENT  There is no cure for PHN. Treatment for PHN will focus on pain relief. Over-the-counter pain relievers do not usually relieve PHN pain. You may need to work with a pain specialist. Treatment may include:  Antidepressant medicines to help with pain and improve sleep.  Antiseizure medicines to relieve nerve pain.  Strong pain relievers (opioids).  A numbing patch worn on the skin (lidocaine patch). HOME CARE INSTRUCTIONS It may take a long time to recover from PHN. Work closely with your health care provider, and have a good support system at home.   Take all medicines as directed by your health care provider.  Wear loose, comfortable clothing.  Cover sensitive areas with a dressing to reduce friction from clothing rubbing on the area.  If cold does not make your pain worse, try applying a cool compress or cooling gel pack to the area.  Talk to your health care provider if you feel depressed or desperate. Living with long-term pain can be depressing. SEEK MEDICAL CARE IF:  Your medicine is not helping.  You are struggling to manage your pain at home.   This information is not intended to replace advice given to you by your health care provider. Make sure you discuss any questions you have with your health care provider.   Document Released: 05/17/2002 Document Revised: 03/17/2014 Document Reviewed: 02/15/2013 Elsevier Interactive Patient Education [employer]

## 2015-03-19 ENCOUNTER — Ambulatory Visit: Payer: Medicare Other | Admitting: Internal Medicine

## 2015-03-27 ENCOUNTER — Ambulatory Visit (INDEPENDENT_AMBULATORY_CARE_PROVIDER_SITE_OTHER): Payer: Medicare Other | Admitting: Physician Assistant

## 2015-03-27 ENCOUNTER — Encounter: Payer: Self-pay | Admitting: Physician Assistant

## 2015-03-27 VITALS — BP 130/80 | HR 84 | Temp 97.9°F | Resp 16 | Ht 67.0 in | Wt 175.4 lb

## 2015-03-27 DIAGNOSIS — E559 Vitamin D deficiency, unspecified: Secondary | ICD-10-CM

## 2015-03-27 DIAGNOSIS — R6889 Other general symptoms and signs: Secondary | ICD-10-CM | POA: Diagnosis not present

## 2015-03-27 DIAGNOSIS — Z0001 Encounter for general adult medical examination with abnormal findings: Secondary | ICD-10-CM

## 2015-03-27 DIAGNOSIS — R7309 Other abnormal glucose: Secondary | ICD-10-CM | POA: Diagnosis not present

## 2015-03-27 DIAGNOSIS — I10 Essential (primary) hypertension: Secondary | ICD-10-CM

## 2015-03-27 DIAGNOSIS — E782 Mixed hyperlipidemia: Secondary | ICD-10-CM | POA: Diagnosis not present

## 2015-03-27 DIAGNOSIS — Z79899 Other long term (current) drug therapy: Secondary | ICD-10-CM | POA: Diagnosis not present

## 2015-03-27 DIAGNOSIS — Z0000 Encounter for general adult medical examination without abnormal findings: Secondary | ICD-10-CM

## 2015-03-27 DIAGNOSIS — R739 Hyperglycemia, unspecified: Secondary | ICD-10-CM

## 2015-03-27 MED ORDER — CITALOPRAM HYDROBROMIDE 20 MG PO TABS: 20.0000 mg | ORAL_TABLET | Freq: Every day | ORAL | Status: DC

## 2015-03-27 NOTE — Patient Instructions (Addendum)
Increase gabapentin to 3 at night to see if this helps with sleep and pain Can do one or two of the gabapentin in the morning.   Continue xanax as needed, can add on celexa 20mg , if this does not help with depression will try wellbutrin.    Your A1C is a measure of your sugar over the past 3 months and is not affected by what you have eaten over the past few days. Diabetes increases your chances of stroke and heart attack over 300 % and is the leading cause of blindness and kidney failure in the [place]. Please make sure you decrease bad carbs like white bread, white rice, potatoes, corn, soft drinks, pasta, cereals, refined sugars, sweet tea, dried fruits, and fruit juice. Good carbs are okay to eat in moderation like sweet potatoes, brown rice, whole grain pasta/bread, most fruit (except dried fruit) and you can eat as many veggies as you want.   Greater than 6.5 is considered diabetic. Between 6.4 and 5.7 is prediabetic If your A1C is less than 5.7 you are NOT diabetic.  Targets for Glucose Readings: Time of Check Target for patients WITHOUT Diabetes Target for DIABETICS  Before Meals Less than 100  less than 150  Two hours after meals Less than 200  Less than 250      Bad carbs also include fruit juice, alcohol, and sweet tea. These are empty calories that do not signal to your brain that you are full.   Please remember the good carbs are still carbs which convert into sugar. So please measure them out no more than 1/2-1 cup of rice, oatmeal, pasta, and beans  Veggies are however free foods! Pile them on.   Not all fruit is created equal. Please see the list below, the fruit at the bottom is higher in sugars than the fruit at the top. Please avoid all dried fruits.     Diabetes is a very complicated disease...lets simplify it.  An easy way to look at it to understand the complications is if you think of the extra sugar floating in your blood stream as glass shards floating  through your blood stream.    Diabetes affects your small vessels first: 1) The glass shards (sugar) scraps down the tiny blood vessels in your eyes and lead to diabetic retinopathy, the leading cause of blindness in the [place]. Diabetes is the leading cause of newly diagnosed adult (58 to 80 years of age) blindness in the [place].  2) The glass shards scratches down the tiny vessels of your legs leading to nerve damage called neuropathy and can lead to amputations of your feet. More than 60% of all non-traumatic amputations of lower limbs occur in people with diabetes.  3) Over time the small vessels in your brain are shredded and closed off, individually this does not cause any problems but over a long period of time many of the small vessels being blocked can lead to Vascular Dementia.   4) Your kidney's are a filter system and have a "net" that keeps certain things in the body and lets bad things out. Sugar shreds this net and leads to kidney damage and eventually failure. Decreasing the sugar that is destroying the net and certain blood pressure medications can help stop or decrease progression of kidney disease. Diabetes was the primary cause of kidney failure in 44 percent of all new cases in 2011.  5) Diabetes also destroys the small vessels in your penis that lead  to erectile dysfunction. Eventually the vessels are so damaged that you may not be responsive to cialis or viagra.   Diabetes and your large vessels: Your larger vessels consist of your coronary arteries in your heart and the carotid vessels to your brain. Diabetes or even increased sugars put you at 300% increased risk of heart attack and stroke and this is why.. The sugar scrapes down your large blood vessels and your body sees this as an internal injury and tries to repair itself. Just like you get a scab on your skin, your platelets will stick to the blood vessel wall trying to heal it. This is why we have diabetics on low  dose aspirin daily, this prevents the platelets from sticking and can prevent plaque formation. In addition, your body takes cholesterol and tries to shove it into the open wound. This is why we want your LDL, or bad cholesterol, below 70.   The combination of platelets and cholesterol over 5-10 years forms plaque that can break off and cause a heart attack or stroke.   PLEASE REMEMBER:  Diabetes is preventable! Up to 15 percent of complications and morbidities among individuals with type 2 diabetes can be prevented, delayed, or effectively treated and minimized with regular visits to a health professional, appropriate monitoring and medication, and a healthy diet and lifestyle.   Counseling services   Dr. Arbutus Leas, Ph.D. [address], [place] [address] Phone: [phone], [place] [id number] [place] [address] [place] [address]   [facility] Hours: Monday-Thursday 830-8pm  Friday 830AM-7PM Address: [place] [phone] [phone].  Address: [place], [place] [address] [facility] [phone] office [email or web address] [address], [place], [place], [place] [address]  Rema Fendt, therapist  Toy Cookey, MA, clinical psychologist  Cognitive-Behavior Therapy; Mood Disorders; Anxiety Disorders; adult and child ADHD; Family Therapy; Stress Management; personal growth, and Marital Therapy.    Terrance Mass Ph.D., clinical psychologist Cognitive-Behavior Therapy; Mood Disorders; Anxiety Disorders; Stress     Management  [facility] [address], [place], [place] [address] [phone]   [facility] [place], psychotherapist [address] [place], [place] [address] [phone]  Karin Golden Ph.D., clinical psychologist [phone] office [place], [place] [id number] Cognitive Behavior Therapy, Depression, Bipolar, Anxiety, Grief and Loss

## 2015-03-27 NOTE — Progress Notes (Signed)
MEDICARE ANNUAL WELLNESS VISIT AND FOLLOW UP Assessment:    1. Essential hypertension - continue medications, DASH diet, exercise and monitor at home. Call if greater than 130/80.   2. Mixed hyperlipidemia -continue medications, check lipids, decrease fatty foods, increase activity.   3. Elevated blood sugar Discussed general issues about diabetes pathophysiology and management., Educational material distributed., Suggested low cholesterol diet., Encouraged aerobic exercise., Discussed foot care., Reminded to get yearly retinal exam.  4. Vitamin D deficiency Continue supplement  5. Medication management  6. Depression Celexa, suggest counseling, follow up 1-2 months   Over 30 minutes of exam, counseling, chart review, and critical decision making was performed    Plan:   During the course of the visit the patient was educated and counseled about appropriate screening and preventive services including:    Pneumococcal vaccine   Influenza vaccine  Prevnar 13  Td vaccine  Screening electrocardiogram  Colorectal cancer screening  Diabetes screening  Glaucoma screening  Nutrition counseling   Conditions/risks identified: BMI: Discussed weight loss, diet, and increase physical activity.  Increase physical activity: AHA recommends 150 minutes of physical activity a week.  Medications reviewed Diabetes is at goal, ACE/ARB therapy: No, Reason not on Ace Inhibitor/ARB therapy:  preDM Urinary Incontinence is not an issue: discussed non pharmacology and pharmacology options.  Fall risk: low- discussed PT, home fall assessment, medications.    Subjective:  Logan Brown is a 82 y.o. male who presents for Medicare Annual Wellness Visit and 3 month follow up for HTN, hyperlipidemia, prediabetes, and vitamin D Def.  Date of last medicare wellness visit was is unknown.  His blood pressure has been controlled at home, today their BP is BP: 130/80 mmHg  BP Readings from  Last 3 Encounters:  03/27/15 130/80  03/14/15 124/66  03/01/15 132/78   He is here 6 months out of the year and 6 month in [place], has a doctor there.  He does not workout. He denies chest pain, shortness of breath, dizziness.  He has been treated for shingles right face, off prednisone now and has been on gabapentin 100mg  BID, still having pain.  He is on cholesterol medication and denies myalgias. His cholesterol is not at goal. The cholesterol last visit was:   Lab Results  Component Value Date   CHOL 178 03/01/2015   HDL 47 03/01/2015   LDLCALC NOT CALC 03/01/2015   TRIG 584* 03/01/2015   CHOLHDL 3.8 03/01/2015   He has been working on diet and exercise for prediabetes, and denies paresthesia of the feet, polydipsia, polyuria and visual disturbances. Last A1C in the office was:  Lab Results  Component Value Date   HGBA1C 6.3* 03/01/2015   Last GFR   Lab Results  Component Value Date   GFRNONAA 54* 03/01/2015  Patient is on Vitamin D supplement.   Lab Results  Component Value Date   VD25OH 69 03/01/2015     BMI is Body mass index is 27.47 kg/(m^2)., he is working on diet and exercise. Wt Readings from Last 3 Encounters:  03/27/15 175 lb 6.4 oz (79.561 kg)  03/14/15 175 lb (79.379 kg)  03/01/15 177 lb 12.8 oz (80.65 kg)      Medication Review: Current Outpatient Prescriptions on File Prior to Visit  Medication Sig Dispense Refill  . ALPRAZolam (XANAX) 1 MG tablet Take 1 mg by mouth. Take 1/2 pill in the am and 1 pill qhs    . gabapentin (NEURONTIN) 100 MG capsule Take 1 capsule (100 mg  total) by mouth 3 (three) times daily. 90 capsule 0  . hydrochlorothiazide (HYDRODIURIL) 25 MG tablet Take 25 mg by mouth daily.    . Multiple Vitamins-Minerals (CENTRUM SILVER ADULT 50+ PO) Take by mouth.    [name] Kitchen omeprazole (PRILOSEC) 20 MG capsule Take 20 mg by mouth daily.    . simvastatin (ZOCOR) 20 MG tablet Take 20 mg by mouth daily. Patient takes 1 1/2 tablets daily=30 mg     No  current facility-administered medications on file prior to visit.    Current Problems (verified) Patient Active Problem List   Diagnosis Date Noted  . HTN (hypertension) 03/01/2015  . Mixed hyperlipidemia 03/01/2015  . Elevated blood sugar 03/01/2015  . Vitamin D deficiency 03/01/2015  . Medication management 03/01/2015    Screening Tests Immunization History  Administered Date(s) Administered  . Influenza, High Dose Seasonal PF 02/19/2015   Preventative care: Last colonoscopy: Never, declines  Prior vaccinations: TD or Tdap: declines Influenza: 2016 Pneumococcal: declines Prevnar13:  declines Shingles/Zostavax: declines  WILL GET FROM PCP IN [place], DECLINES SHOTS  Names of Other Physician/Practitioners you currently use: 1. [facility] here for primary care 2. unknown, eye doctor, last visit 2-3 years ago 3. None, dentist, has dentures Patient Care Team: [name], MD as PCP - General (Internal Medicine)  Past Surgical History  Procedure Laterality Date  . Eye surgery Bilateral     IOL/CE on Lt in 1998 and Rt in 2011.  . Cataract extraction     History reviewed. No pertinent family history. Social History  Substance Use Topics  . Smoking status: Former Smoker    Quit date: 03/14/1975  . Smokeless tobacco: Never Used  . Alcohol Use: No     Comment: occ    MEDICARE WELLNESS OBJECTIVES: Tobacco use: He does not smoke.  Patient is a former smoker. If yes, counseling given Alcohol Current alcohol use: social drinker Osteoporosis: dietary calcium and/or vitamin D deficiency, History of fracture in the past year: no Fall risk: Low Risk Diet: in general, a "healthy" diet   Physical activity: Current Exercise Habits:: The patient does not participate in regular exercise at present Cardiac risk factors: Cardiac Risk Factors include: advanced age (>53men, >69 women);dyslipidemia;hypertension;male gender;sedentary  lifestyle Depression/mood screen:   Depression screen [facility] 2/9 03/27/2015  Decreased Interest 1  Down, Depressed, Hopeless 1  PHQ - 2 Score 2  Altered sleeping 1  Tired, decreased energy 1  Change in appetite 0  Feeling bad or failure about yourself  0  Trouble concentrating 0  Moving slowly or fidgety/restless 0  Suicidal thoughts 0  PHQ-9 Score 4  Difficult doing work/chores Somewhat difficult    ADLs:  In your present state of health, do you have any difficulty performing the following activities: 03/27/2015  Hearing? Y  Vision? Y  Difficulty concentrating or making decisions? N  Walking or climbing stairs? N  Dressing or bathing? N  Doing errands, shopping? N  Preparing Food and eating ? N  Using the Toilet? N  In the past six months, have you accidently leaked urine? N  Do you have problems with loss of bowel control? N  Managing your Medications? N  Managing your Finances? N  Housekeeping or managing your Housekeeping? N     Cognitive Testing  Alert? Yes  Normal Appearance?Yes  Oriented to person? Yes  Place? Yes   Time? Yes  Recall of three objects?  Yes  Can perform simple calculations? Yes  Displays appropriate  judgment?Yes  Can read the correct time from a watch face?Yes  EOL planning: Does patient have an advance directive?: Yes Type of Advance Directive: Healthcare Power of Attorney, Living will Does patient want to make changes to advanced directive?: No - Patient declined Copy of advanced directive(s) in chart?: No - copy requested   Objective:   Today's Vitals   03/27/15 1536  BP: 130/80  Pulse: 84  Temp: 97.9 F (36.6 C)  TempSrc: Temporal  Resp: 16  Height: 5\' 7"  (1.702 m)  Weight: 175 lb 6.4 oz (79.561 kg)  SpO2: 98%  PainSc: 2   PainLoc: Head   Body mass index is 27.47 kg/(m^2).  General appearance: alert, no distress, WD/WN, male HEENT: normocephalic, sclerae anicteric, TMs pearly, nares patent, no discharge or erythema, pharynx  normal Oral cavity: MMM, no lesions Neck: supple, no lymphadenopathy, no thyromegaly, no masses Heart: RRR, normal S1, S2, no murmurs Lungs: CTA bilaterally, no wheezes, rhonchi, or rales Abdomen: +bs, soft, non tender, non distended, no masses, no hepatomegaly, no splenomegaly Musculoskeletal: nontender, no swelling, no obvious deformity Extremities: no edema, no cyanosis, no clubbing Pulses: 2+ symmetric, upper and lower extremities, normal cap refill Neurological: alert, oriented x 3, CN2-12 intact, strength normal upper extremities and lower extremities, sensation normal throughout, DTRs 2+ throughout, no cerebellar signs, gait normal Psychiatric: normal affect, behavior normal, pleasant   Medicare Attestation I have personally reviewed: The patient's medical and social history Their use of alcohol, tobacco or illicit drugs Their current medications and supplements The patient's functional ability including ADLs,fall risks, home safety risks, cognitive, and hearing and visual impairment Diet and physical activities Evidence for depression or mood disorders  The patient's weight, height, BMI, and visual acuity have been recorded in the chart.  I have made referrals, counseling, and provided education to the patient based on review of the above and I have provided the patient with a written personalized care plan for preventive services.     Vicie Mutters, PA-C   03/27/2015

## 2015-06-05 ENCOUNTER — Ambulatory Visit: Payer: Self-pay | Admitting: Internal Medicine

## 2015-07-09 ENCOUNTER — Ambulatory Visit

## 2015-07-13 LAB — HX OPIATES CONFIRMATION/QUANTITATION URINE
CASE NUMBER: 2017121002598
HX UR OPIATES 6 AM: <10
HX UR OPIATES CODEINE: <20
HX UR OPIATES MORPHINE: <20
HX UR OPIATES NORHYDROCODONE: <20
HX UR OPIATES NOROXYCODONE: <20
HX UR OPIATES NOROXYMORPHONE: <20
HX UR OPIATES OXYCODONE: <20
HX UR OPIATES, HYDROCODONE: <20
HX UR OPIATES, HYDROMORPHONE: <20
HX UR OPIATES, OXYMORPHONE: <20

## 2015-09-07 ENCOUNTER — Inpatient Hospital Stay
Admit: 2015-09-07 | Disposition: A | Discharge status: Home Health | Source: Home / Self Care | Attending: Cardiovascular Disease | Admitting: Cardiovascular Disease

## 2015-09-07 LAB — HX .AUTOMATED DIFF
CASE NUMBER: 2017181000519
HX ABSOLUTE NEUTRO COUNT: 4610 /mm3
HX BASOPHILS: 0 % — NL (ref 0.0–1.0)
HX EOSINOPHILS: 1 % — NL (ref 0.0–3.0)
HX IMMATURE GRANULOCYTES: 0 % — NL (ref 0.0–2.0)
HX LYMPHOCYTES: 22 % — NL (ref 22.0–40.0)
HX MONOCYTES: 13 % — ABNORMAL HIGH (ref 0.0–11.0)
HX NEU CT MEASURED: 4.61
HX NEUTROPHILS: 63 % — NL (ref 40.0–71.0)

## 2015-09-07 LAB — HX CBC W/ DIFF
CASE NUMBER: 2017181000519
HX HCT: 39.5 % — NL (ref 39.0–50.0)
HX HGB: 13 g/dL — NL (ref 13.0–17.0)
HX MCH: 32 pg — ABNORMAL HIGH (ref 27.0–31.0)
HX MCHC: 32.9 g/dL — NL (ref 32.0–36.0)
HX MCV: 97 fL — ABNORMAL HIGH (ref 80.0–94.0)
HX MPV: 11.1 fL — NL (ref 7.4–11.5)
HX NRBC PERCENT: 0 % — NL
HX PLATELET: 140 10*3/uL — ABNORMAL LOW (ref 150.0–400.0)
HX RBC: 4.06 10*6/uL — ABNORMAL LOW (ref 4.2–5.4)
HX RDW: 13.6 % — NL (ref 11.5–14.5)
HX WBC: 7.3 10*3/uL — NL (ref 3.6–10.5)

## 2015-09-07 LAB — HX PTT
CASE NUMBER: 2017181000519
CASE NUMBER: 2017181000673
CASE NUMBER: 2017181002194
HX PTT: 29.2 s — NL (ref 23.4–32.1)
HX PTT: 29.4 s — NL (ref 23.4–32.1)
HX PTT: 53.9 s — ABNORMAL HIGH (ref 23.4–32.1)

## 2015-09-07 LAB — HX COMPREHENSIVE METABOLIC PANEL
CASE NUMBER: 2017181000519
HX ALBUMIN LVL: 3.5 g/dL (ref 3.5–5.0)
HX ALKALINE PHOSPHATASE: 48 U/L (ref 45.0–117.0)
HX ALT: 22 U/L (ref 6.0–78.0)
HX ANION GAP: 8 (ref 3.0–11.0)
HX AST: 22 U/L (ref 6.0–40.0)
HX BILIRUBIN TOTAL: 2 mg/dL — ABNORMAL HIGH (ref 0.2–1.0)
HX BUN: 24 mg/dL — ABNORMAL HIGH (ref 7.0–18.0)
HX CALCIUM LVL: 8.9 mg/dL (ref 8.5–10.1)
HX CHLORIDE: 102 mmol/L (ref 98.0–107.0)
HX CO2: 27 mmol/L (ref 21.0–32.0)
HX CREATININE: 1.56 mg/dL — ABNORMAL HIGH (ref 0.55–1.3)
HX GLUCOSE LVL: 98 mg/dL (ref 65.0–110.0)
HX POTASSIUM LVL: 3.8 mmol/L (ref 3.6–5.2)
HX SODIUM LVL: 137 mmol/L (ref 136.0–145.0)
HX TOTAL PROTEIN: 6.5 g/dL (ref 6.0–8.0)

## 2015-09-07 LAB — HX URINE DIPSTICK W/REFLEX
CASE NUMBER: 2017181000520
HX UA BILIRUBIN: NEGATIVE — NL
HX UA BLOOD: NEGATIVE — NL
HX UA GLUCOSE: NEGATIVE — NL
HX UA KETONES: NEGATIVE — NL
HX UA LEUKOCYTE ESTERASE: NEGATIVE — NL
HX UA NITRITE: NEGATIVE — NL
HX UA PH: 6 — NL (ref 5.0–8.0)
HX UA PROTEIN: NEGATIVE — NL
HX UA SPECIFIC GRAVITY: 1.012 — NL (ref 1.005–1.03)
HX UA UROBILINOGEN: NEGATIVE — NL

## 2015-09-07 LAB — HX LIPID PANEL
CASE NUMBER: 2017181000519
HX CHOL: 111 mg/dL — ABNORMAL LOW (ref 140.0–200.0)
HX HDL: 57 mg/dL — NL (ref 41.0–60.0)
HX LDL: 36 mg/dL — NL (ref 0.0–129.0)
HX TRIG: 88 mg/dL — NL (ref 0.0–149.0)

## 2015-09-07 LAB — HX TSH
CASE NUMBER: 2017181000519
HX 3RD GEN TSH: 1.15 u[IU]/mL — NL (ref 0.358–3.74)

## 2015-09-07 LAB — HX TROPONIN I
CASE NUMBER: 2017181000519
HX TROPONIN I: 0.035 ng/mL — NL (ref 0.015–0.045)

## 2015-09-07 LAB — HX PT
CASE NUMBER: 2017181000519
HX INR: 1.1
HX PT: 12.4 s — ABNORMAL HIGH (ref 9.3–11.6)

## 2015-09-07 LAB — HX SST GOLD TUBE TO HOLD: CASE NUMBER: 2017181000519

## 2015-09-07 LAB — HX GLOMERULAR FILTRATION RATE (ESTIMATED)
CASE NUMBER: 2017181000519
HX AFN AMER GLOMERULAR FILTRATION RATE: 47 mL/min/{1.73_m2}
HX NON-AFN AMER GLOMERULAR FILTRATION RATE: 41 mL/min/{1.73_m2}

## 2015-09-07 LAB — HX MAGNESIUM LEVEL
CASE NUMBER: 2017181000519
HX MAGNESIUM LVL: 2.2 mg/dL — NL (ref 1.8–2.4)

## 2015-09-08 LAB — HX GLOMERULAR FILTRATION RATE (ESTIMATED)
CASE NUMBER: 2017182000011
HX AFN AMER GLOMERULAR FILTRATION RATE: 58 mL/min/{1.73_m2}
HX NON-AFN AMER GLOMERULAR FILTRATION RATE: 50 mL/min/{1.73_m2}

## 2015-09-08 LAB — HX PT
CASE NUMBER: 2017182000215
HX INR: 1.2
HX PT: 12.7 s — ABNORMAL HIGH (ref 9.3–11.6)

## 2015-09-08 LAB — HX CBC W/ INDICES
CASE NUMBER: 2017182000008
HX HCT: 40.6 % — NL (ref 39.0–50.0)
HX HGB: 13.3 g/dL — NL (ref 13.0–17.0)
HX MCH: 33 pg — ABNORMAL HIGH (ref 27.0–31.0)
HX MCHC: 32.8 g/dL — NL (ref 32.0–36.0)
HX MCV: 100 fL — ABNORMAL HIGH (ref 80.0–94.0)
HX MPV: 11.3 fL — NL (ref 7.4–11.5)
HX NRBC PERCENT: 0 % — NL
HX PLATELET: 136 10*3/uL — ABNORMAL LOW (ref 150.0–400.0)
HX RBC: 4.08 10*6/uL — ABNORMAL LOW (ref 4.2–5.4)
HX RDW: 13.9 % — NL (ref 11.5–14.5)
HX WBC: 11.3 10*3/uL — ABNORMAL HIGH (ref 3.6–10.5)

## 2015-09-08 LAB — HX PTT
CASE NUMBER: 2017181002765
CASE NUMBER: 2017182000215
CASE NUMBER: 2017182001010
HX PTT: 56.9 s — ABNORMAL HIGH (ref 23.4–32.1)
HX PTT: 58.6 s — ABNORMAL HIGH (ref 23.4–32.1)
HX PTT: 64.6 s — ABNORMAL HIGH (ref 23.4–32.1)

## 2015-09-08 LAB — HX BASIC METABOLIC PANEL
CASE NUMBER: 2017182000011
HX ANION GAP: 9 — NL (ref 3.0–11.0)
HX BUN: 19 mg/dL — ABNORMAL HIGH (ref 7.0–18.0)
HX CALCIUM LVL: 8.7 mg/dL — NL (ref 8.5–10.1)
HX CHLORIDE: 104 mmol/L — NL (ref 98.0–107.0)
HX CO2: 23 mmol/L — NL (ref 21.0–32.0)
HX CREATININE: 1.31 mg/dL — ABNORMAL HIGH (ref 0.55–1.3)
HX GLUCOSE LVL: 77 mg/dL — NL (ref 65.0–110.0)
HX POTASSIUM LVL: 4 mmol/L — NL (ref 3.6–5.2)
HX SODIUM LVL: 136 mmol/L — NL (ref 136.0–145.0)

## 2015-09-09 LAB — HX BASIC METABOLIC PANEL
CASE NUMBER: 2017183000043
HX ANION GAP: 9 — NL (ref 3.0–11.0)
HX BUN: 25 mg/dL — ABNORMAL HIGH (ref 7.0–18.0)
HX CALCIUM LVL: 8.6 mg/dL — NL (ref 8.5–10.1)
HX CHLORIDE: 102 mmol/L — NL (ref 98.0–107.0)
HX CO2: 24 mmol/L — NL (ref 21.0–32.0)
HX CREATININE: 1.55 mg/dL — ABNORMAL HIGH (ref 0.55–1.3)
HX GLUCOSE LVL: 114 mg/dL — ABNORMAL HIGH (ref 65.0–110.0)
HX POTASSIUM LVL: 3.8 mmol/L — NL (ref 3.6–5.2)
HX SODIUM LVL: 135 mmol/L — ABNORMAL LOW (ref 136.0–145.0)

## 2015-09-09 LAB — HX PT
CASE NUMBER: 2017183000043
HX INR: 1.1
HX PT: 12.3 s — ABNORMAL HIGH (ref 9.3–11.6)

## 2015-09-09 LAB — HX MAGNESIUM LEVEL
CASE NUMBER: 2017183001088
HX MAGNESIUM LVL: 2.2 mg/dL — NL (ref 1.8–2.4)

## 2015-09-09 LAB — HX CBC W/ INDICES
CASE NUMBER: 2017183000043
HX HCT: 35.9 % — ABNORMAL LOW (ref 39.0–50.0)
HX HGB: 12.4 g/dL — ABNORMAL LOW (ref 13.0–17.0)
HX MCH: 33 pg — ABNORMAL HIGH (ref 27.0–31.0)
HX MCHC: 34.5 g/dL — NL (ref 32.0–36.0)
HX MCV: 95 fL — ABNORMAL HIGH (ref 80.0–94.0)
HX MPV: 11.1 fL — NL (ref 7.4–11.5)
HX NRBC PERCENT: 0 % — NL
HX PLATELET: 132 10*3/uL — ABNORMAL LOW (ref 150.0–400.0)
HX RBC: 3.78 10*6/uL — ABNORMAL LOW (ref 4.2–5.4)
HX RDW: 14.3 % — NL (ref 11.5–14.5)
HX WBC: 7.1 10*3/uL — NL (ref 3.6–10.5)

## 2015-09-09 LAB — HX GLOMERULAR FILTRATION RATE (ESTIMATED)
CASE NUMBER: 2017183000043
HX AFN AMER GLOMERULAR FILTRATION RATE: 47 mL/min/{1.73_m2}
HX NON-AFN AMER GLOMERULAR FILTRATION RATE: 41 mL/min/{1.73_m2}

## 2015-09-09 LAB — HX PTT
CASE NUMBER: 2017183000043
HX PTT: 62 s — ABNORMAL HIGH (ref 23.4–32.1)

## 2015-09-10 LAB — HX CBC W/ INDICES
CASE NUMBER: 2017184000042
HX HCT: 34 % — ABNORMAL LOW (ref 39.0–50.0)
HX HGB: 11.4 g/dL — ABNORMAL LOW (ref 13.0–17.0)
HX MCH: 33 pg — ABNORMAL HIGH (ref 27.0–31.0)
HX MCHC: 33.5 g/dL — NL (ref 32.0–36.0)
HX MCV: 97 fL — ABNORMAL HIGH (ref 80.0–94.0)
HX MPV: 11.3 fL — NL (ref 7.4–11.5)
HX NRBC PERCENT: 0 % — NL
HX PLATELET: 132 10*3/uL — ABNORMAL LOW (ref 150.0–400.0)
HX RBC: 3.5 10*6/uL — ABNORMAL LOW (ref 4.2–5.4)
HX RDW: 14.1 % — NL (ref 11.5–14.5)
HX WBC: 6.1 10*3/uL — NL (ref 3.6–10.5)

## 2015-09-10 LAB — HX BASIC METABOLIC PANEL
CASE NUMBER: 2017184000042
HX ANION GAP: 8 — NL (ref 3.0–11.0)
HX BUN: 24 mg/dL — ABNORMAL HIGH (ref 7.0–18.0)
HX CALCIUM LVL: 8.9 mg/dL — NL (ref 8.5–10.1)
HX CHLORIDE: 100 mmol/L — NL (ref 98.0–107.0)
HX CO2: 25 mmol/L — NL (ref 21.0–32.0)
HX CREATININE: 1.47 mg/dL — ABNORMAL HIGH (ref 0.55–1.3)
HX GLUCOSE LVL: 124 mg/dL — ABNORMAL HIGH (ref 65.0–110.0)
HX POTASSIUM LVL: 3.9 mmol/L — NL (ref 3.6–5.2)
HX SODIUM LVL: 133 mmol/L — ABNORMAL LOW (ref 136.0–145.0)

## 2015-09-10 LAB — HX NT-PROBNP
CASE NUMBER: 15377862
HX NT-PROBNP: 3830 pg/mL — ABNORMAL HIGH (ref 0.0–1800.0)

## 2015-09-10 LAB — HX PTT
CASE NUMBER: 2017184000042
CASE NUMBER: 2017184001245
CASE NUMBER: 2017184002307
HX PTT: 78.6 s — ABNORMAL HIGH (ref 23.4–32.1)
HX PTT: 54.6 s — ABNORMAL HIGH (ref 23.4–32.1)
HX PTT: 55.6 s — ABNORMAL HIGH (ref 23.4–32.1)

## 2015-09-10 LAB — HX D-DIMER QUANTITATIVE
CASE NUMBER: 2017184001245
HX D-DIMER QUANT: 0.56 mg{FEU}/L — ABNORMAL HIGH

## 2015-09-10 LAB — HX GLOMERULAR FILTRATION RATE (ESTIMATED)
CASE NUMBER: 2017184000042
HX AFN AMER GLOMERULAR FILTRATION RATE: 51 mL/min/{1.73_m2}
HX NON-AFN AMER GLOMERULAR FILTRATION RATE: 44 mL/min/{1.73_m2}

## 2015-09-10 LAB — HX MAGNESIUM LEVEL
CASE NUMBER: 2017184000042
HX MAGNESIUM LVL: 2.3 mg/dL — NL (ref 1.8–2.4)

## 2015-09-10 LAB — HX PT
CASE NUMBER: 2017184000042
HX INR: 1.1
HX PT: 12 s — ABNORMAL HIGH (ref 9.3–11.6)

## 2015-09-11 LAB — HX HEPATIC FUNCTION PANEL
CASE NUMBER: 2017185001032
HX ALBUMIN LVL: 3.3 g/dL — ABNORMAL LOW (ref 3.5–5.0)
HX ALKALINE PHOSPHATASE: 134 U/L — ABNORMAL HIGH (ref 45.0–117.0)
HX ALT: 39 U/L — NL (ref 6.0–78.0)
HX AST: 42 U/L — ABNORMAL HIGH (ref 6.0–40.0)
HX BILIRUBIN DIRECT: 0.5 mg/dL — ABNORMAL HIGH (ref 0.0–0.3)
HX BILIRUBIN TOTAL: 0.9 mg/dL — NL (ref 0.2–1.0)
HX TOTAL PROTEIN: 6.9 g/dL — NL (ref 6.0–8.0)

## 2015-09-11 LAB — HX LIPASE LEVEL
CASE NUMBER: 2017185001032
HX LIPASE LVL: 167 U/L — NL (ref 73.0–393.0)

## 2015-09-11 LAB — HX PTT
CASE NUMBER: 2017185000052
HX PTT: 53.9 s — ABNORMAL HIGH (ref 23.4–32.1)

## 2015-09-11 LAB — HX BASIC METABOLIC PANEL
CASE NUMBER: 2017185000052
HX ANION GAP: 11 — NL (ref 3.0–11.0)
HX BUN: 25 mg/dL — ABNORMAL HIGH (ref 7.0–18.0)
HX CALCIUM LVL: 9.1 mg/dL — NL (ref 8.5–10.1)
HX CHLORIDE: 103 mmol/L — NL (ref 98.0–107.0)
HX CO2: 25 mmol/L — NL (ref 21.0–32.0)
HX CREATININE: 1.65 mg/dL — ABNORMAL HIGH (ref 0.55–1.3)
HX GLUCOSE LVL: 139 mg/dL — ABNORMAL HIGH (ref 65.0–110.0)
HX POTASSIUM LVL: 4.3 mmol/L — NL (ref 3.6–5.2)
HX SODIUM LVL: 139 mmol/L — NL (ref 136.0–145.0)

## 2015-09-11 LAB — HX CBC W/ INDICES
CASE NUMBER: 2017185000052
HX HCT: 37.3 % — ABNORMAL LOW (ref 39.0–50.0)
HX HGB: 12.3 g/dL — ABNORMAL LOW (ref 13.0–17.0)
HX MCH: 32 pg — ABNORMAL HIGH (ref 27.0–31.0)
HX MCHC: 33 g/dL — NL (ref 32.0–36.0)
HX MCV: 97 fL — ABNORMAL HIGH (ref 80.0–94.0)
HX MPV: 11.3 fL — NL (ref 7.4–11.5)
HX NRBC PERCENT: 0 % — NL
HX PLATELET: 169 10*3/uL — NL (ref 150.0–400.0)
HX RBC: 3.83 10*6/uL — ABNORMAL LOW (ref 4.2–5.4)
HX RDW: 14.2 % — NL (ref 11.5–14.5)
HX WBC: 6.9 10*3/uL — NL (ref 3.6–10.5)

## 2015-09-11 LAB — HX TROPONIN I
CASE NUMBER: 15383137
HX TROPONIN I: 0.051 ng/mL — ABNORMAL HIGH (ref 0.015–0.045)

## 2015-09-11 LAB — HX GLOMERULAR FILTRATION RATE (ESTIMATED)
CASE NUMBER: 2017185000052
HX AFN AMER GLOMERULAR FILTRATION RATE: 44 mL/min/{1.73_m2}
HX NON-AFN AMER GLOMERULAR FILTRATION RATE: 38 mL/min/{1.73_m2}

## 2015-09-11 LAB — HX PT
CASE NUMBER: 2017185000052
HX INR: 1.1
HX PT: 11.9 s — ABNORMAL HIGH (ref 9.3–11.6)

## 2015-09-11 LAB — HX NT-PROBNP
CASE NUMBER: 2017185001032
HX NT-PROBNP: 12306 pg/mL — ABNORMAL HIGH (ref 0.0–1800.0)

## 2015-09-12 LAB — HX BASIC METABOLIC PANEL
CASE NUMBER: 2017186000040
HX ANION GAP: 7 — NL (ref 3.0–11.0)
HX BUN: 23 mg/dL — ABNORMAL HIGH (ref 7.0–18.0)
HX CALCIUM LVL: 9.2 mg/dL — NL (ref 8.5–10.1)
HX CHLORIDE: 101 mmol/L — NL (ref 98.0–107.0)
HX CO2: 29 mmol/L — NL (ref 21.0–32.0)
HX CREATININE: 1.8 mg/dL — ABNORMAL HIGH (ref 0.55–1.3)
HX GLUCOSE LVL: 86 mg/dL — NL (ref 65.0–110.0)
HX POTASSIUM LVL: 3.8 mmol/L — NL (ref 3.6–5.2)
HX SODIUM LVL: 137 mmol/L — NL (ref 136.0–145.0)

## 2015-09-12 LAB — HX CBC W/ INDICES
CASE NUMBER: 2017186000040
HX HCT: 36.4 % — ABNORMAL LOW (ref 39.0–50.0)
HX HGB: 12.3 g/dL — ABNORMAL LOW (ref 13.0–17.0)
HX MCH: 32 pg — ABNORMAL HIGH (ref 27.0–31.0)
HX MCHC: 33.8 g/dL — NL (ref 32.0–36.0)
HX MCV: 96 fL — ABNORMAL HIGH (ref 80.0–94.0)
HX MPV: 10.7 fL — NL (ref 7.4–11.5)
HX NRBC PERCENT: 0 % — NL
HX PLATELET: 152 10*3/uL — NL (ref 150.0–400.0)
HX RBC: 3.8 10*6/uL — ABNORMAL LOW (ref 4.2–5.4)
HX RDW: 14.3 % — NL (ref 11.5–14.5)
HX WBC: 6.1 10*3/uL — NL (ref 3.6–10.5)

## 2015-09-12 LAB — HX GLOMERULAR FILTRATION RATE (ESTIMATED)
CASE NUMBER: 2017186000040
HX AFN AMER GLOMERULAR FILTRATION RATE: 40 mL/min/{1.73_m2}
HX NON-AFN AMER GLOMERULAR FILTRATION RATE: 34 mL/min/{1.73_m2}

## 2015-09-12 LAB — HX PT
CASE NUMBER: 2017186000040
HX INR: 1.1
HX PT: 12.2 s — ABNORMAL HIGH (ref 9.3–11.6)

## 2015-09-14 ENCOUNTER — Ambulatory Visit: Admitting: Family Medicine

## 2015-09-14 LAB — HX GLOMERULAR FILTRATION RATE (ESTIMATED)
CASE NUMBER: 2017188002013
HX AFN AMER GLOMERULAR FILTRATION RATE: 44 mL/min/{1.73_m2}
HX NON-AFN AMER GLOMERULAR FILTRATION RATE: 38 mL/min/{1.73_m2}

## 2015-09-14 LAB — HX LAVENDER TOP TO HOLD: CASE NUMBER: 2017188002013

## 2015-09-14 LAB — HX BASIC METABOLIC PANEL
CASE NUMBER: 2017188002013
HX ANION GAP: 6 — NL (ref 3.0–11.0)
HX BUN: 23 mg/dL — ABNORMAL HIGH (ref 7.0–18.0)
HX CALCIUM LVL: 9.2 mg/dL — NL (ref 8.5–10.1)
HX CHLORIDE: 104 mmol/L — NL (ref 98.0–107.0)
HX CO2: 30 mmol/L — NL (ref 21.0–32.0)
HX CREATININE: 1.64 mg/dL — ABNORMAL HIGH (ref 0.55–1.3)
HX GLUCOSE LVL: 71 mg/dL — NL (ref 65.0–110.0)
HX POTASSIUM LVL: 4.2 mmol/L — NL (ref 3.6–5.2)
HX SODIUM LVL: 140 mmol/L — NL (ref 136.0–145.0)

## 2015-09-14 LAB — HX NT-PROBNP
CASE NUMBER: 2017188002013
HX NT-PROBNP: 7041 pg/mL — ABNORMAL HIGH (ref 0.0–1800.0)

## 2015-10-16 ENCOUNTER — Ambulatory Visit: Admitting: Family

## 2015-10-16 LAB — HX BASIC METABOLIC PANEL
CASE NUMBER: 2017220001900
HX ANION GAP: 5 — NL (ref 3.0–11.0)
HX BUN: 27 mg/dL — ABNORMAL HIGH (ref 7.0–18.0)
HX CALCIUM LVL: 9.4 mg/dL — NL (ref 8.5–10.1)
HX CHLORIDE: 105 mmol/L — NL (ref 98.0–107.0)
HX CO2: 31 mmol/L — NL (ref 21.0–32.0)
HX CREATININE: 1.91 mg/dL — ABNORMAL HIGH (ref 0.55–1.3)
HX GLUCOSE LVL: 117 mg/dL — ABNORMAL HIGH (ref 65.0–110.0)
HX POTASSIUM LVL: 4.6 mmol/L — NL (ref 3.6–5.2)
HX SODIUM LVL: 141 mmol/L — NL (ref 136.0–145.0)

## 2015-10-16 LAB — HX NT-PROBNP
CASE NUMBER: 2017220001900
HX NT-PROBNP: 1469 pg/mL — NL (ref 0.0–1800.0)

## 2015-10-16 LAB — HX GLOMERULAR FILTRATION RATE (ESTIMATED)
CASE NUMBER: 2017220001900
HX AFN AMER GLOMERULAR FILTRATION RATE: 37 mL/min/{1.73_m2}
HX NON-AFN AMER GLOMERULAR FILTRATION RATE: 32 mL/min/{1.73_m2}

## 2015-10-19 ENCOUNTER — Ambulatory Visit: Admitting: Cardiovascular Disease

## 2015-10-19 LAB — HX PTT
CASE NUMBER: 2017223000512
HX PTT: 35.6 s — ABNORMAL HIGH (ref 23.4–32.1)

## 2015-10-19 LAB — HX PT
CASE NUMBER: 2017223000512
HX INR: 1.3
HX PT: 14.6 s — ABNORMAL HIGH (ref 9.3–11.6)

## 2015-10-19 LAB — HX ELECTROLYTE PANEL
CASE NUMBER: 2017223000512
HX ANION GAP: 5 — NL (ref 3.0–11.0)
HX CHLORIDE: 104 mmol/L — NL (ref 98.0–107.0)
HX CO2: 30 mmol/L — NL (ref 21.0–32.0)
HX POTASSIUM LVL: 5.2 mmol/L — NL (ref 3.6–5.2)
HX SODIUM LVL: 139 mmol/L — NL (ref 136.0–145.0)

## 2015-10-23 ENCOUNTER — Ambulatory Visit: Admitting: Cardiovascular Disease

## 2015-10-23 NOTE — H&P (Signed)
cc:  [name] D.O.    __________________________________________________________________________    PREOPERATIVE HISTORY AND PHYSICAL  ADMITTED:  10/23/2015    CHIEF COMPLAINT:    1.  Atrial flutter to have cardioversion.    2.  LV dysfunction.  3.  Congestive heart failure.    REASON FOR ADMISSION:  This patient is admitted for elective cardioversion.  The  patient was hospitalized in July with shortness of breath and was noted to have  atrial flutter.  He had a history of hypertension and hyperlipidemia followed by  Dr. Steffanie Dunn.  The patient improved in terms of breathlessness,  however still  has limitations and has continued to be in atrial flutter despite treating with  amiodarone orally.  In the past, he had a history of heavy cigarette smoking  use. While in the hospital he was treated with gentle IV hydration after some  hypotension and IV diltiazem for rate control.  This was switched to beta  blocker for rate control and was treated with NOAC anticoagulation.  The patient  has been on Xarelto religiously for over four weeks.  He does have a decrease in  renal function with a BUN of 27, creatinine 1.9 and thus has been on Xarelto 15  mg daily.    MEDICATIONS    1.  Amiodarone 200 mg once a day.  2.  Metoprolol succinate ER 100 mg per day.  3.  Hydrochlorothiazide 12.5 mg daily.  4.  Aspirin 81 mg daily.  5.  Lisinopril 5 mg p.o. daily.    PHYSICAL EXAMINATION:  VITAL SIGNS:  Blood pressure 120/76.  Weight 163.  Heart  rate 85 and irregular.    GENERAL:  Reveals a sallow man.    HEENT:  Conjunctivae pink.    NECK:  No JV distention.    CHEST:  Clear to P and A.      HEART:  Irregular heart rate, S3.  Grade 1/6 systolic murmur left sternal  border.      ABDOMEN:  No organomegaly.    EXTREMITIES:  No edema.  Pulses normal.    CARDIOLOGY DATA:  EKG reveals atrial flutter with controlled ventricular  response and left anterior hemiblock.    IMPRESSION:  1.  Atrial flutter.  2.  Left ventricular  dysfunction.  3.  Congestive heart failure.  4.  History of hypertension.    COMMENT:  The patient will have cardioversion.    Dictated by:  Thalia Party, [name]    D:  10/18/2015 17:51:11  T:  10/18/2015 18:18:39  E:  10/18/2015 18:18:39  JW/dmb  Job# [id number]   SIGNATURE LINE    Electronically signed by Wyline Mood MD, [facility] on 10/22/2015 at 17:02:13 EST

## 2016-01-14 ENCOUNTER — Ambulatory Visit: Payer: Self-pay | Admitting: Internal Medicine

## 2016-01-23 ENCOUNTER — Encounter: Payer: Self-pay | Admitting: Physician Assistant

## 2016-01-23 ENCOUNTER — Ambulatory Visit (INDEPENDENT_AMBULATORY_CARE_PROVIDER_SITE_OTHER): Payer: Medicare Other | Admitting: Physician Assistant

## 2016-01-23 VITALS — BP 130/80 | HR 72 | Temp 98.2°F | Resp 16 | Ht 67.0 in | Wt 173.0 lb

## 2016-01-23 DIAGNOSIS — R232 Flushing: Secondary | ICD-10-CM

## 2016-01-23 DIAGNOSIS — I10 Essential (primary) hypertension: Secondary | ICD-10-CM

## 2016-01-23 DIAGNOSIS — T783XXA Angioneurotic edema, initial encounter: Secondary | ICD-10-CM

## 2016-01-23 MED ORDER — GABAPENTIN 100 MG PO CAPS: 100.0000 mg | ORAL_CAPSULE | Freq: Three times a day (TID) | ORAL | 0 refills | Status: DC

## 2016-01-23 MED ORDER — METRONIDAZOLE 0.75 % EX CREA: TOPICAL_CREAM | Freq: Two times a day (BID) | CUTANEOUS | 1 refills | Status: DC

## 2016-01-23 MED ORDER — DEXAMETHASONE SODIUM PHOSPHATE 100 MG/10ML IJ SOLN: 10.0000 mg | Freq: Once | INTRAMUSCULAR | Status: DC

## 2016-01-23 NOTE — Patient Instructions (Addendum)
Stop the lisinopril Monitor your blood pressure Take benadryl and Tagement from over the counter Angioedema is a serious condition that can kill you If you have any trouble swallowing or lip swelling go to the ER  Can get back on gabapentin for the itching on your face Can use metronidazole for your face    Angioedema Angioedema is sudden swelling in the body. The swelling can happen in any part of the body. It often happens on the skin and causes itchy, bumpy patches (hives) to form. This condition may:  Happen only one time.  Happen more than one time. It may come back at random times.  Keep coming back for a number of years. Someday it may stop coming back. Follow these instructions at home:  Take over-the-counter and prescription medicines only as told by your doctor.  If you were given medicines for emergency allergy treatment, always carry them with you.  Wear a medical bracelet as told by your doctor.  Avoid the things that cause your attacks (triggers).  If this condition was passed to you from your parents and you want to have kids, talk to your doctor. Your kids may also have this condition. Contact a doctor if:  You have another attack.  Your attacks happen more often, even after you take steps to prevent them.  This condition was passed to you by your parents and you want to have kids. Get help right away if:  Your mouth, tongue, or lips get very swollen.  You have trouble breathing.  You have trouble swallowing.  You pass out (faint). This information is not intended to replace advice given to you by your health care provider. Make sure you discuss any questions you have with your health care provider. Document Released: 02/12/2009 Document Revised: 09/26/2015 Document Reviewed: 09/04/2015 Elsevier Interactive Patient Education  2017 [employer].

## 2016-01-23 NOTE — Progress Notes (Signed)
Subjective:    Patient ID: Logan Brown, male    DOB: 1931/10/23, 82 y.o.   MRN: [id number]  HPI 82 y.o. WM presents with right lip swelling started today, has had itchy, red rash on neck, right nasal fold since the shingles with redness around his face. Lip swelling has gotten better, and he has been off gabapentin x 1 month. He went to hospital in [place] for heart fluttering he was put on amiodarone, xarelto and lisinopril 5mg . Does not have records.   Blood pressure 130/80, pulse 72, resp. rate 16, height 5\' 7"  (1.702 m), weight 173 lb (78.5 kg).   Medications Current Outpatient Prescriptions on File Prior to Visit  Medication Sig  . ALPRAZolam (XANAX) 1 MG tablet Take 1 mg by mouth. Take 1/2 pill in the am and 1 pill qhs  . Multiple Vitamins-Minerals (CENTRUM SILVER ADULT 50+ PO) Take by mouth.  [name] Kitchen omeprazole (PRILOSEC) 20 MG capsule Take 20 mg by mouth daily.   No current facility-administered medications on file prior to visit.     Problem list He has HTN (hypertension); Mixed hyperlipidemia; Elevated blood sugar; Vitamin D deficiency; and Medication management on his problem list.  Review of Systems  Constitutional: Negative.   HENT: Negative for drooling, facial swelling, sore throat and trouble swallowing.   Respiratory: Negative.  Negative for choking and shortness of breath.   Cardiovascular: Negative.   Musculoskeletal: Negative.   Skin: Positive for color change and rash.  Neurological: Negative.        Objective:   Physical Exam  Constitutional: He is oriented to person, place, and time. He appears well-developed and well-nourished. No distress.  HENT:  Head: Normocephalic.  Mouth/Throat: Uvula is midline and oropharynx is clear and moist. No oral lesions. No uvula swelling. No oropharyngeal exudate, posterior oropharyngeal edema or tonsillar abscesses.  Facial tenderness to palpation with erythema right side of face and telangiectasias on bilateral cheeks.   Symmetric eyebrow raise.  Symmetric smile. Very mild bottom lip swelling, no tongue swelling, uvula midline, normal posterior pharynx.   Eyes: Conjunctivae are normal. No scleral icterus.  Neck: Normal range of motion. Neck supple. No JVD present. No thyromegaly present.  Cardiovascular: Normal rate, regular rhythm, normal heart sounds and intact distal pulses.  Exam reveals no gallop and no friction rub.   No murmur heard. Pulmonary/Chest: Breath sounds normal. No respiratory distress. He has no wheezes. He has no rales. He exhibits no tenderness.  Abdominal: Soft. Bowel sounds are normal. He exhibits no distension and no mass. There is no tenderness. There is no rebound and no guarding.  Musculoskeletal: Normal range of motion.  Lymphadenopathy:    He has no cervical adenopathy.  Neurological: He is alert and oriented to person, place, and time. He has normal strength. No cranial nerve deficit or sensory deficit. Coordination normal.  Skin: Skin is warm and dry. He is not diaphoretic.  Psychiatric: He has a normal mood and affect. His behavior is normal. Judgment and thought content normal.  Nursing note and vitals reviewed.      Assessment & Plan:  Angioedema? Versus allergic reaction- patient declines going to ER, understands risk of this progressing and death, will have neighbor stay with him, dexamethasone shot here, benadryl, tagamet, STOP ACE and will add to allergy, if worse go to ER, patient and neighbor understands plan Itching/redness right face- no diarrhea, no ETOH, will give metronadizole gel, get back on gabapentin 2 x a day.   ? New atrial  flutter- will get notes from [place] [facility]- follow up 1 month  Future Appointments Date Time Provider [place]  02/21/2016 2:30 PM Vicie Mutters, PA-C [facility] None

## 2016-01-30 ENCOUNTER — Other Ambulatory Visit: Payer: Self-pay | Admitting: Physician Assistant

## 2016-01-30 MED ORDER — TRIAMCINOLONE ACETONIDE 0.1 % EX CREA: 1.0000 "application " | TOPICAL_CREAM | Freq: Two times a day (BID) | CUTANEOUS | 1 refills | Status: DC

## 2016-01-30 NOTE — Progress Notes (Signed)
Will stop metrocream and start very low dose triamcinolone for face and will get back on gabapentin. Has follow up 11/22 Future Appointments Date Time Provider [place]  02/21/2016 2:30 PM Vicie Mutters, PA-C [facility] None

## 2016-02-12 ENCOUNTER — Telehealth: Payer: Self-pay

## 2016-02-12 NOTE — Telephone Encounter (Signed)
ISA from clinical pharmacy: wants to know if FREDRICK Bentz should stop his Lisinopril  Per provider YES due to possible angioedema.

## 2016-02-21 ENCOUNTER — Encounter: Payer: Self-pay | Admitting: Physician Assistant

## 2016-02-21 NOTE — Progress Notes (Signed)
Assessment and Plan:   Hypertension -Continue medication, monitor blood pressure at home. Continue DASH diet.  Reminder to go to the ER if any CP, SOB, nausea, dizziness, severe HA, changes vision/speech, left arm numbness and tingling and jaw pain.  Cholesterol -Continue diet and exercise. Check cholesterol.    Prediabetes  -Continue diet and exercise. Check A1C  Vitamin D Def - check level and continue medications.   Continue diet and meds as discussed. Further disposition pending results of labs. Over 30 minutes of exam, counseling, chart review, and critical decision making was performed  Future Appointments Date Time Provider [facility]  02/21/2016 2:30 PM Vicie Mutters, PA-C [facility] None     HPI 82 y.o. male  presents for 3 month follow up on hypertension, cholesterol, prediabetes, and vitamin D deficiency.   Patient had shingles on right side of face, has continued numbing/pain, on gabapebtin.   His blood pressure has been controlled at home, today their BP is    He does not workout. He denies chest pain, shortness of breath, dizziness. He went to hospital in [place] for heart fluttering he was put on amiodarone, xarelto and lisinopril 5mg , had possible angioedema while on ACE and it was removed. We have not received records yet from the .hospital.   He is not on cholesterol medication and denies myalgias. His cholesterol is not at goal. The cholesterol last visit was:   Lab Results  Component Value Date   CHOL 178 03/01/2015   HDL 47 03/01/2015   LDLCALC NOT CALC 03/01/2015   TRIG 584 (H) 03/01/2015   CHOLHDL 3.8 03/01/2015    He has been working on diet and exercise for prediabetes, and denies polydipsia, polyuria and visual disturbances. Last A1C in the office was:  Lab Results  Component Value Date   HGBA1C 6.3 (H) 03/01/2015   Patient is on Vitamin D supplement.   Lab Results  Component Value Date   VD25OH 69 03/01/2015          Current  Medications:  Current Outpatient Prescriptions on File Prior to Visit  Medication Sig  . ALPRAZolam (XANAX) 1 MG tablet Take 1 mg by mouth. Take 1/2 pill in the am and 1 pill qhs  . amiodarone (PACERONE) 200 MG tablet Take 200 mg by mouth daily.  [name] Kitchen aspirin EC 81 MG tablet Take 81 mg by mouth daily.  [name] Kitchen gabapentin (NEURONTIN) 100 MG capsule TAKE ONE CAPSULE BY MOUTH THREE TIMES DAILY  . hydrochlorothiazide (MICROZIDE) 12.5 MG capsule Take 12.5 mg by mouth daily.  . metroNIDAZOLE (METROCREAM) 0.75 % cream Apply topically 2 (two) times daily.  . Multiple Vitamins-Minerals (CENTRUM SILVER ADULT 50+ PO) Take by mouth.  [name] Kitchen omeprazole (PRILOSEC) 20 MG capsule Take 20 mg by mouth daily.  . Rivaroxaban (XARELTO) 15 MG TABS tablet Take 15 mg by mouth 2 (two) times daily with a meal.  . triamcinolone cream (KENALOG) 0.1 % Apply 1 application topically 2 (two) times daily.   Current Facility-Administered Medications on File Prior to Visit  Medication  . dexamethasone (DECADRON) injection 10 mg    Medical History:  Past Medical History:  Diagnosis Date  . Arthritis   . Cancer ([place])   . Hyperlipidemia   . Prostate cancer ([place])   . Prostate cancer ([place])    Allergies:  Allergies  Allergen Reactions  . Ace Inhibitors Swelling    angioedema     Review of Systems:  ROS  Family history- Review and unchanged Social history- Review  and unchanged Physical Exam: There were no vitals taken for this visit. Wt Readings from Last 3 Encounters:  01/23/16 173 lb (78.5 kg)  03/27/15 175 lb 6.4 oz (79.6 kg)  03/14/15 175 lb (79.4 kg)   General Appearance: Well nourished, in no apparent distress. Eyes: PERRLA, EOMs, conjunctiva no swelling or erythema Sinuses: No Frontal/maxillary tenderness ENT/Mouth: Ext aud canals clear, TMs without erythema, bulging. No erythema, swelling, or exudate on post pharynx.  Tonsils not swollen or erythematous. Hearing normal.  Neck: Supple, thyroid normal.   Respiratory: Respiratory effort normal, BS equal bilaterally without rales, rhonchi, wheezing or stridor.  Cardio: RRR with no MRGs. Brisk peripheral pulses without edema.  Abdomen: Soft, + BS,  Non tender, no guarding, rebound, hernias, masses. Lymphatics: Non tender without lymphadenopathy.  Musculoskeletal: Full ROM, 5/5 strength, Normal gait Skin: Warm, dry without rashes, lesions, ecchymosis.  Neuro: Cranial nerves intact. Normal muscle tone, no cerebellar symptoms. Psych: Awake and oriented X 3, normal affect, Insight and Judgment appropriate.    Vicie Mutters, PA-C 2:04 PM [facility]    This encounter was created in error - please disregard.

## 2016-03-24 ENCOUNTER — Ambulatory Visit: Payer: Self-pay | Admitting: Physician Assistant

## 2016-03-24 DIAGNOSIS — F325 Major depressive disorder, single episode, in full remission: Secondary | ICD-10-CM | POA: Insufficient documentation

## 2016-03-24 NOTE — Progress Notes (Deleted)
MEDICARE ANNUAL WELLNESS VISIT AND FOLLOW UP Assessment:      Over 30 minutes of exam, counseling, chart review, and critical decision making was performed  Future Appointments Date Time Provider [place]  03/24/2016 11:30 AM Vicie Mutters, PA-C [facility] None     Plan:   During the course of the visit the patient was educated and counseled about appropriate screening and preventive services including:    Pneumococcal vaccine   Influenza vaccine  Prevnar 13  Td vaccine  Screening electrocardiogram  Colorectal cancer screening  Diabetes screening  Glaucoma screening  Nutrition counseling    Subjective:  Logan Brown is a 83 y.o. male who presents for Medicare Annual Wellness Visit and 3 month follow up for HTN, hyperlipidemia, prediabetes, and vitamin D Def.  Date of last medicare wellness visit was is unknown.  His blood pressure has been controlled at home, today their BP is    BP Readings from Last 3 Encounters:  02/21/16 124/72  01/23/16 130/80  03/27/15 130/80   He is here 6 months out of the year and 6 month in [place], has a doctor there.  He does not workout. He denies chest pain, shortness of breath, dizziness.  He has been treated for shingles right face, off prednisone now and has been on gabapentin 100mg  BID, still having pain.  He is on cholesterol medication and denies myalgias. His cholesterol is not at goal. The cholesterol last visit was:   Lab Results  Component Value Date   CHOL 178 03/01/2015   HDL 47 03/01/2015   LDLCALC NOT CALC 03/01/2015   TRIG 584 (H) 03/01/2015   CHOLHDL 3.8 03/01/2015   He has been working on diet and exercise for prediabetes, and denies paresthesia of the feet, polydipsia, polyuria and visual disturbances. Last A1C in the office was:  Lab Results  Component Value Date   HGBA1C 6.3 (H) 03/01/2015   Last GFR   Lab Results  Component Value Date   GFRNONAA 54 (L) 03/01/2015  Patient is on Vitamin D  supplement.   Lab Results  Component Value Date   VD25OH 69 03/01/2015     BMI is There is no height or weight on file to calculate BMI., he is working on diet and exercise. Wt Readings from Last 3 Encounters:  02/21/16 216 lb (98 kg)  01/23/16 173 lb (78.5 kg)  03/27/15 175 lb 6.4 oz (79.6 kg)    Medication Review: Current Outpatient Prescriptions on File Prior to Visit  Medication Sig Dispense Refill  . ALPRAZolam (XANAX) 1 MG tablet Take 1 mg by mouth. Take 1/2 pill in the am and 1 pill qhs    . amiodarone (PACERONE) 200 MG tablet Take 200 mg by mouth daily.    [name] Kitchen aspirin EC 81 MG tablet Take 81 mg by mouth daily.    [name] Kitchen gabapentin (NEURONTIN) 100 MG capsule TAKE ONE CAPSULE BY MOUTH THREE TIMES DAILY 270 capsule 0  . hydrochlorothiazide (MICROZIDE) 12.5 MG capsule Take 12.5 mg by mouth daily.    . metroNIDAZOLE (METROCREAM) 0.75 % cream Apply topically 2 (two) times daily. 60 g 1  . Multiple Vitamins-Minerals (CENTRUM SILVER ADULT 50+ PO) Take by mouth.    [name] Kitchen omeprazole (PRILOSEC) 20 MG capsule Take 20 mg by mouth daily.    . Rivaroxaban (XARELTO) 15 MG TABS tablet Take 15 mg by mouth 2 (two) times daily with a meal.    . triamcinolone cream (KENALOG) 0.1 % Apply 1 application topically 2 (two)  times daily. 15 g 1   Current Facility-Administered Medications on File Prior to Visit  Medication Dose Route Frequency Provider Last Rate Last Dose  . dexamethasone (DECADRON) injection 10 mg  10 mg Intramuscular Once Vicie Mutters, PA-C        Current Problems (verified) Patient Active Problem List   Diagnosis Date Noted  . HTN (hypertension) 03/01/2015  . Mixed hyperlipidemia 03/01/2015  . Elevated blood sugar 03/01/2015  . Vitamin D deficiency 03/01/2015  . Medication management 03/01/2015    Screening Tests Immunization History  Administered Date(s) Administered  . Influenza, High Dose Seasonal PF 02/19/2015   Preventative care: Last colonoscopy: Never, declines  Prior  vaccinations: TD or Tdap: declines Influenza: 2016 Pneumococcal: declines Prevnar13:  declines Shingles/Zostavax: declines  WILL GET FROM PCP IN [place], DECLINES SHOTS  Names of Other Physician/Practitioners you currently use: 1. [facility] here for primary care 2. unknown, eye doctor, last visit 2-3 years ago 3. None, dentist, has dentures Patient Care Team: [name], MD as PCP - General (Internal Medicine)  Allergies Allergies  Allergen Reactions  . Ace Inhibitors Swelling    angioedema    SURGICAL HISTORY He  has a past surgical history that includes Eye surgery (Bilateral) and Cataract extraction. FAMILY HISTORY His family history is not on file. SOCIAL HISTORY He  reports that he quit smoking about 41 years ago. He has never used smokeless tobacco. He reports that he does not drink alcohol or use drugs.  MEDICARE WELLNESS OBJECTIVES: Physical activity:   Cardiac risk factors:   Depression/mood screen:   Depression screen [facility] 2/9 03/27/2015  Decreased Interest 1  Down, Depressed, Hopeless 1  PHQ - 2 Score 2  Altered sleeping 1  Tired, decreased energy 1  Change in appetite 0  Feeling bad or failure about yourself  0  Trouble concentrating 0  Moving slowly or fidgety/restless 0  Suicidal thoughts 0  PHQ-9 Score 4  Difficult doing work/chores Somewhat difficult    ADLs:  In your present state of health, do you have any difficulty performing the following activities: 03/27/2015  Hearing? Y  Vision? Y  Difficulty concentrating or making decisions? N  Walking or climbing stairs? N  Dressing or bathing? N  Doing errands, shopping? N  Preparing Food and eating ? N  Using the Toilet? N  In the past six months, have you accidently leaked urine? N  Do you have problems with loss of bowel control? N  Managing your Medications? N  Managing your Finances? N  Housekeeping or managing your Housekeeping? N  Some recent data  might be hidden     Cognitive Testing  Alert? Yes  Normal Appearance?Yes  Oriented to person? Yes  Place? Yes   Time? Yes  Recall of three objects?  Yes  Can perform simple calculations? Yes  Displays appropriate judgment?Yes  Can read the correct time from a watch face?Yes  EOL planning:     Objective:   There were no vitals filed for this visit. There is no height or weight on file to calculate BMI.  General appearance: alert, no distress, WD/WN, male HEENT: normocephalic, sclerae anicteric, TMs pearly, nares patent, no discharge or erythema, pharynx normal Oral cavity: MMM, no lesions Neck: supple, no lymphadenopathy, no thyromegaly, no masses Heart: RRR, normal S1, S2, no murmurs Lungs: CTA bilaterally, no wheezes, rhonchi, or rales Abdomen: +bs, soft, non tender, non distended, no masses, no hepatomegaly, no splenomegaly Musculoskeletal: nontender, no swelling, no  obvious deformity Extremities: no edema, no cyanosis, no clubbing Pulses: 2+ symmetric, upper and lower extremities, normal cap refill Neurological: alert, oriented x 3, CN2-12 intact, strength normal upper extremities and lower extremities, sensation normal throughout, DTRs 2+ throughout, no cerebellar signs, gait normal Psychiatric: normal affect, behavior normal, pleasant   Medicare Attestation I have personally reviewed: The patient's medical and social history Their use of alcohol, tobacco or illicit drugs Their current medications and supplements The patient's functional ability including ADLs,fall risks, home safety risks, cognitive, and hearing and visual impairment Diet and physical activities Evidence for depression or mood disorders  The patient's weight, height, BMI, and visual acuity have been recorded in the chart.  I have made referrals, counseling, and provided education to the patient based on review of the above and I have provided the patient with a written personalized care plan for  preventive services.     Vicie Mutters, PA-C   03/24/2016

## 2016-08-07 ENCOUNTER — Emergency Department
Admit: 2016-08-07 | Disposition: A | Discharge status: Home / Self Care | Source: Home / Self Care | Attending: Emergency Medicine | Admitting: Emergency Medicine

## 2016-08-07 ENCOUNTER — Ambulatory Visit: Admitting: Urology

## 2016-08-07 ENCOUNTER — Ambulatory Visit

## 2016-08-07 LAB — HX .AUTOMATED DIFF
CASE NUMBER: 2018151000185
HX ABSOLUTE NEUTRO COUNT: 2670 /mm3
HX BASOPHILS: 1 % — NL (ref 0.0–1.0)
HX EOSINOPHILS: 4 % — ABNORMAL HIGH (ref 0.0–3.0)
HX IMMATURE GRANULOCYTES: 0 % — NL (ref 0.0–2.0)
HX LYMPHOCYTES: 38 % — NL (ref 22.0–40.0)
HX MONOCYTES: 10 % — NL (ref 0.0–11.0)
HX NEU CT MEASURED: 2.67
HX NEUTROPHILS: 47 % — NL (ref 40.0–71.0)

## 2016-08-07 LAB — HX BASIC METABOLIC PANEL
CASE NUMBER: 2018151000185
HX ANION GAP: 6 — NL (ref 3.0–11.0)
HX BUN: 26 mg/dL — ABNORMAL HIGH (ref 7.0–18.0)
HX CALCIUM LVL: 9.5 mg/dL — NL (ref 8.5–10.1)
HX CHLORIDE: 104 mmol/L — NL (ref 98.0–110.0)
HX CO2: 28 mmol/L — NL (ref 21.0–32.0)
HX CREATININE: 1.92 mg/dL — ABNORMAL HIGH (ref 0.55–1.3)
HX GLUCOSE LVL: 87 mg/dL — NL (ref 65.0–110.0)
HX POTASSIUM LVL: 4.3 mmol/L — NL (ref 3.6–5.2)
HX SODIUM LVL: 138 mmol/L — NL (ref 136.0–145.0)

## 2016-08-07 LAB — HX CBC W/ DIFF
CASE NUMBER: 2018151000185
HX HCT: 41.3 % — NL (ref 39.0–50.0)
HX HGB: 13.6 g/dL — NL (ref 13.0–17.0)
HX MCH: 32 pg — ABNORMAL HIGH (ref 27.0–31.0)
HX MCHC: 32.9 g/dL — NL (ref 32.0–36.0)
HX MCV: 99 fL — ABNORMAL HIGH (ref 80.0–94.0)
HX MPV: 9.9 fL — NL (ref 7.4–11.5)
HX NRBC PERCENT: 0 % — NL
HX PLATELET: 181 10*3/uL — NL (ref 150.0–400.0)
HX RBC: 4.19 10*6/uL — ABNORMAL LOW (ref 4.2–5.4)
HX RDW: 13.2 % — NL (ref 11.5–14.5)
HX WBC: 5.7 10*3/uL — NL (ref 3.6–10.5)

## 2016-08-07 LAB — HX GLOMERULAR FILTRATION RATE (ESTIMATED)
CASE NUMBER: 2018151000185
HX AFN AMER GLOMERULAR FILTRATION RATE: 37 mL/min/{1.73_m2}
HX NON-AFN AMER GLOMERULAR FILTRATION RATE: 32 mL/min/{1.73_m2}

## 2016-08-07 LAB — HX URINE DIPSTICK W/REFLEX
CASE NUMBER: 2018151000180
HX UA BILIRUBIN: NEGATIVE — NL
HX UA GLUCOSE: NEGATIVE — NL
HX UA KETONES: NEGATIVE — NL
HX UA LEUKOCYTE ESTERASE: NEGATIVE — NL
HX UA NITRITE: NEGATIVE — NL
HX UA PH: 6 — NL (ref 5.0–8.0)
HX UA RBC: >182 — ABNORMAL HIGH (ref 0.0–3.0)
HX UA SPECIFIC GRAVITY: 1.019 — NL (ref 1.005–1.03)
HX UA SQUAMOUS EPITHELIAL: <1 — NL (ref 0.0–5.0)
HX UA UROBILINOGEN: 2 — AB
HX UA WBC: <1 — NL (ref 0.0–5.0)

## 2016-08-07 LAB — HX BLUE TOP TO HOLD: CASE NUMBER: 2018151000187

## 2016-08-07 LAB — HX SST GOLD TUBE TO HOLD: CASE NUMBER: 2018151000187

## 2016-08-07 NOTE — Progress Notes (Signed)
* * *        **  [name]**    --- ---    67 Y old Male, DOB: November 15, 1931    [address], [place], [place] [address]    Home: [phone]    Provider: [name]        * * *    Telephone Encounter    ---    Answered by   Dolores Frame  Date: 08/07/2016         Time: 11:02 AM    Reason   FYI only    --- ---            Message                      In ER at 3 AM today with  Gross Heme/clots                Action Taken   [facility] 08/07/2016 11:02:35 AM > lm for pt to call - he  should probably be seen if he wishes to come her - there is no insurance  listed in [facility] so might want to ask Leahy,Nancy 08/07/2016 3:05:02 PM > lm  Leahy,Nancy 08/07/2016 5:00:13 PM > lm Leahy,Nancy 08/07/2016 5:00:17 PM > can  you check on him tomorrow - old pt of RA and I think he lives alone  [facility] 08/08/2016 10:18:41 AM > pt declined appt- he was told  hematuria was secondary to infection- he will call in the future if needs to  schedule Leahy,Nancy 08/11/2016 8:34:12 AM > just an FYI on an old pt  Carolan Avedisian E 08/11/2016 2:53:20 PM > if he is back in town: i should see  him:; please call him he has prostate cancer and was radiated so hematuria may  o may not be due to a UTI.[name] Kitchen also try to see if he is living here now.. get an  address if possible Leahy,Nancy 08/11/2016 3:18:59 PM > called pt - when he  hears who I am he hangs up - address we have is same as [facility] recently -  Delmos Velaquez E 08/12/2016 8:00:22 AM > will send a letter                * * *                ---          * * *          Patient: [name], [name] DOB: 1932-01-12 Provider: [name]  08/07/2016    ---    Note generated by eClinicalWorks EMR/PM Software ([email or web address])

## 2016-08-17 ENCOUNTER — Emergency Department
Admit: 2016-08-17 | Disposition: A | Discharge status: Home / Self Care | Source: Home / Self Care | Attending: Pediatrics | Admitting: Pediatrics

## 2016-08-17 LAB — HX CBC W/ DIFF
CASE NUMBER: 2018161000494
HX HCT: 39.7 % — NL (ref 39.0–50.0)
HX HGB: 13 g/dL — NL (ref 13.0–17.0)
HX MCH: 32 pg — ABNORMAL HIGH (ref 27.0–31.0)
HX MCHC: 32.7 g/dL — NL (ref 32.0–36.0)
HX MCV: 98 fL — ABNORMAL HIGH (ref 80.0–94.0)
HX MPV: 9.8 fL — NL (ref 7.4–11.5)
HX NRBC PERCENT: 0 % — NL
HX PLATELET: 183 10*3/uL — NL (ref 150.0–400.0)
HX RBC: 4.07 10*6/uL — ABNORMAL LOW (ref 4.2–5.4)
HX RDW: 13.5 % — NL (ref 11.5–14.5)
HX WBC: 5.1 10*3/uL — NL (ref 3.6–10.5)

## 2016-08-17 LAB — HX .MICROSCOPIC URINE
CASE NUMBER: 2018161000495
HX UA RBC: >182 — ABNORMAL HIGH (ref 0.0–3.0)
HX UA SQUAMOUS EPITHELIAL: <1 — NL (ref 0.0–5.0)
HX UA WBC: <1 — NL (ref 0.0–5.0)

## 2016-08-17 LAB — HX BASIC METABOLIC PANEL
CASE NUMBER: 2018161000494
HX ANION GAP: 3 — NL (ref 3.0–11.0)
HX BUN: 29 mg/dL — ABNORMAL HIGH (ref 7.0–18.0)
HX CALCIUM LVL: 9 mg/dL — NL (ref 8.5–10.1)
HX CHLORIDE: 105 mmol/L — NL (ref 98.0–110.0)
HX CO2: 26 mmol/L — NL (ref 21.0–32.0)
HX CREATININE: 2.22 mg/dL — ABNORMAL HIGH (ref 0.55–1.3)
HX GLUCOSE LVL: 148 mg/dL — ABNORMAL HIGH (ref 65.0–110.0)
HX POTASSIUM LVL: 4.6 mmol/L — NL (ref 3.6–5.2)
HX SODIUM LVL: 134 mmol/L — ABNORMAL LOW (ref 136.0–145.0)

## 2016-08-17 LAB — HX PT
CASE NUMBER: 2018161000494
HX INR: 1.1
HX PT: 11.8 s — ABNORMAL HIGH (ref 9.3–11.6)

## 2016-08-17 LAB — HX GLOMERULAR FILTRATION RATE (ESTIMATED)
CASE NUMBER: 2018161000494
HX AFN AMER GLOMERULAR FILTRATION RATE: 31 mL/min/{1.73_m2}
HX NON-AFN AMER GLOMERULAR FILTRATION RATE: 26 mL/min/{1.73_m2}

## 2016-08-17 LAB — HX .AUTOMATED DIFF
CASE NUMBER: 2018161000494
HX ABSOLUTE NEUTRO COUNT: 3230 /mm3
HX BASOPHILS: 0 % — NL (ref 0.0–1.0)
HX EOSINOPHILS: 3 % — NL (ref 0.0–3.0)
HX IMMATURE GRANULOCYTES: 0 % — NL (ref 0.0–2.0)
HX LYMPHOCYTES: 22 % — NL (ref 22.0–40.0)
HX MONOCYTES: 11 % — NL (ref 0.0–11.0)
HX NEU CT MEASURED: 3.23
HX NEUTROPHILS: 63 % — NL (ref 40.0–71.0)

## 2016-08-17 LAB — HX SST GOLD TUBE TO HOLD: CASE NUMBER: 2018161000534

## 2016-08-17 LAB — HX PTT
CASE NUMBER: 2018161000494
HX PTT: 33.4 s — ABNORMAL HIGH (ref 23.4–32.1)

## 2016-08-18 ENCOUNTER — Ambulatory Visit

## 2016-08-18 LAB — HX URINE CULTURE
CASE NUMBER: 2018161000496
HX F: 10000

## 2016-08-20 ENCOUNTER — Ambulatory Visit: Admitting: Urology

## 2016-08-20 LAB — HX URINE FENTANYL
CASE NUMBER: 2018164002998
HX U FENTANYL SCRN: NEGATIVE
HX U PH FOR DAU: 5 — NL (ref 4.5–8.0)

## 2016-08-20 NOTE — Progress Notes (Signed)
* * *        **[name]    --- ---    14 Y old Male, DOB: 23-Feb-1932    Account Number: [id number]    [address], [place], [place]    Home: [phone]    Guarantor: [name] Insurance: [name] Medicare Complete Payer ID: [id number]    PCP: [name], MD Referring: [name], MD    Appointment Facility: [facility], [facility].        * * *    08/20/2016  Progress Notes: [name], [name]    --- ---    ---        Current Medications    ---    Taking     * Centrum Silver Tablet as directed Orally     ---    * Hydrochlorothiazide 25 MG Tablet 1 tablet Orally Once a day    ---    * Omeprazole 20 MG Capsule Delayed Release 1 capsule Orally Once a day    ---    * Xanax 0.25 MG Tablet 1 tablet Orally Twice a day    ---    * Hydrocodone-Acetaminophen 5-325 MG Tablet 1 tablet as needed Orally every 6 hrs    ---    * Amiodarone HCl 200 MG Tablet 1 tablet Orally Once a day    ---    * Zestril 5 MG Tablet 1 tablet Orally Once a day    ---    Not-Taking/PRN    * Zocor 10 MG Tablet 1 tablet every evening Orally Once a day    ---    * Aspirin 81 MG Tablet Delayed Release 1 tablet Orally Once a day    ---    * Xarelto 15 MG Tablet Orally     ---    * Medication List reviewed and reconciled with the patient    ---      Past Medical History    ---       prostate cancer: Dx'd 7/06; S/P XRT: 11/06-1/07: gleason's 8.        ---    GERD.        ---    Hypercholesterolemia.        ---    Hypertension.        ---    Anxiety.        ---    Atrial Fibrillation: @ 2017.        ---    ED - Impotence of organic origin.        ---    Renal insufficiency.        ---      Surgical History    ---      Trans Rectal [place] and Prostate BX 09/2004    ---    cholecystectomy 04/2004    ---    inguinal hernia repair    ---    cardioversion    ---      Family History    ---      No Family History documented.    ---      Social History    ---    Alcohol: yes, occasional.    no Smoking status: never smoker,  Patient counseled on the dangers of tobacco  use: 08/20/2016.    no Caffeine.      Allergies    ---      N.K.D.A.    ---  Review of Systems    ---     _General_ :    no Chills. no Weakness. no Abdominal pain.    _Gastroenterology_ :    no Constipation. no Diarrhea.    _Urology_ :    Blood in urine yes. no Frequent urination. no Urinary incontinence.            Reason for Appointment    ---      1\. Gross Hematuria/Clots ; prostate cancer    ---      History of Present Illness    ---     _Urological History_ :    pt here for the first time in 5 years: pt noticed some blood in his urine: did  clear up with antibiotics; then returned and no true infection: good flow, ?  empties, nocturia x 1; no recent urinary issues until now was on xarelto and  stopped 3 days ago and a baby asa.      Vital Signs    ---    Ht 5'7", Wt 168, BMI 26.31, BP 152/86, Temp 97.4.      Examination    ---     _General_ :    Lungs/Respirations: No labored breathing, good excursion. Musculoskeletal:  normal gait.    _Genitourinary - Male_ :    General appearance: alert, oriented, build: average, no apparent distress,  pleasant. Abdomen: soft, non-tender, no mass, no cvat, non-distended.  Anus/Perineum: anus was intact, no lesions, no rashes, no fissures/fistulas.  Digital Rectal Exam (DRE): normal, no masses. Epididymis: normal, non-tender,  bilateral. Penis normal, no rashes, no phimosis. Prostate flat, firm, non-  tender. Scrotum: normal, no rashes, no lesions. Sphincter tone: normal.  Testicles: not enlarged. non-tender, normal bilaterally. Urethral meatus:  normal, no discharge. HEENT: Head: normocephalic, Head: atraumatic, Sclera:  anicteric. Lymphatic: no inguinal adenopathy noted. Skin: good turgor.          Assessments    ---    1\. Prostate cancer - C61 (Primary)    ---    2\. Gross hematuria - R31.0    ---    3\. Atrial fibrillation, unspecified type - I48.91    ---    4\. Essential hypertension - I10    ---    5\.  Hypercholesteremia - E78.00    ---    6\. Gastroesophageal reflux disease, esophagitis presence not specified -  K21.9    ---    7\. Anxiety - F41.9    ---    8\. Renal insufficiency - N28.9    ---      Treatment    ---       **1\. Prostate cancer**    Notes: I discussed this issue with the pt and his wife at length. although his  PSA could be somewhat falsely elevated by an acute LUT issue now, the main  concern is that as I have told him in the past that he is not cured of hsi  prostate cancer and needs routine GU follow up and that he is 11 years after  radiation for a high grade tumor and that this is not unusual and that we may  need to start hormonal therapy on him once i have worked up his hematuria. He  may need a nuclear one scan at some point in time as well.    ---        **2\. Gross hematuria**    _LAB: Urine Cytology_     Marcaurelle,[name] 08/20/2016 9:11:58 AM >  [name] E 08/20/2016 9:19:50 AM >    --- ---        Notes: discussed with pt at length about a hematuria workup using pictures. Pt  needs an upper tract study followed by an office cystoscopy. he had a CT (  with no contrast due to his renal insuff) that showed no obvious GU pathology  of his upper tracts but that he needs a cystoscopy and I explained my concerns  abut bladder cancer and radiation cystitis etc.        **3\. Atrial fibrillation, unspecified type**    Notes: the ED told him to hold his xarelto and baby asa and i told then to  contact Dr. Wyline Mood.        **4\. Others**    Notes: Patient Educated with: Dentist.pdf Engineer, materials.pdf).    Clinical Notes: I spent extra time with them and emphasized the importance of  close medical follow up: for his prostate cancer with me and for his cardiac  issues with Dr. Gwinda Passe. He travels between here and [place] and I  strongly suggested to them that he get Docs down there as well for routine  follow ups. His wife became emotional because I do not think she realized  this  and I made sure that the pt understood what I had said about routine GU follow  up.        Labs    --- ---    [name]: Urine Culture, Routine_    ---    _Lab: ClinitekStatus SG_       Color  red       --- --- --- ---    GLU  negative       --- --- --- ---    BIL  negative       --- --- --- ---    KET  negative       --- --- --- ---    SG  1.015       --- --- --- ---    BLO  large       --- --- --- ---    pH  5.0       --- --- --- ---    PRO  trace       --- --- --- ---    URO  0.2       --- --- --- ---    NIT  negative       --- --- --- ---    LEU  large       --- --- --- ---         [facility] 08/20/2016 8:40:33 AM > Camaria Gerald E 08/20/2016  8:53:42 AM >    --- ---    [name]: Urine Microscopy_       WBC  5-10       --- --- --- ---    RBC  30-40       --- --- --- ---    Epithelial  0       --- --- --- ---    Bacteria  trace       --- --- --- ---    Amorphous  trace       --- --- --- ---    Mucous  trace       --- --- --- ---    Casts  0       --- --- --- ---         Sherlene Shams 08/20/2016  9:10:05 AM > Omair Dettmer E 17-Sep-2016  9:19:40 AM >    --- ---    [name]: PSA, total_ 23.8 ng/ml       level  23.8 ng/ml       --- --- --- ---         Sherlene Shams 17-Sep-2016 9:09:25 AM > Kody Vigil E Sep 17, 2016  9:13:05 AM > Bradden Tadros E 09/17/2016 9:19:45 AM >    --- ---      Preventive Medicine    ---      Counseling: Blood pressure management Patient encouraged to adhere to blood  pressure testing and management per Primary care physician 2016-09-17.    ---      Procedure Codes    ---      81003 UA Automated    ---    69629 UCCC    ---    81015 MICROSCOPIC EXAM OF URINE    ---    52841 PSA    ---      Follow Up    ---    cystoscopy    Electronically signed by Leola Brazil , MD on 09/17/16 at 12:11 PM EDT    Sign off status: Completed        * * [facility]    [address]    [place], [place] [id number]    Tel: [phone]    Fax: [phone]              * *  *          Patient: Draydon, Clairmont DOB: January 25, 1932 Progress Note: [name],  [name] 09-17-2016    ---    Note generated by eClinicalWorks EMR/PM Software ([email or web address])

## 2016-08-22 LAB — HX SURG PATH FINAL REPORT
CASE NUMBER: 0
HX NOTE: 88112

## 2016-08-23 LAB — HX OPIATES CONFIRMATION/QUANTITATION URINE
CASE NUMBER: 2018164002998
HX UR OPIATES 6 AM: <10
HX UR OPIATES CODEINE: <20
HX UR OPIATES MORPHINE: <20
HX UR OPIATES NORHYDROCODONE: <20
HX UR OPIATES NOROXYCODONE: <20
HX UR OPIATES NOROXYMORPHONE: <20
HX UR OPIATES OXYCODONE: <20
HX UR OPIATES, HYDROCODONE: <20
HX UR OPIATES, HYDROMORPHONE: <20
HX UR OPIATES, OXYMORPHONE: <20

## 2016-08-26 ENCOUNTER — Ambulatory Visit

## 2016-08-26 ENCOUNTER — Ambulatory Visit (HOSPITAL_BASED_OUTPATIENT_CLINIC_OR_DEPARTMENT_OTHER): Admitting: Acute Care

## 2016-08-27 ENCOUNTER — Ambulatory Visit: Admitting: Urology

## 2016-08-27 NOTE — Progress Notes (Signed)
* * *        **[name]    --- ---    23 Y old Male, DOB: [birth year]    Account Number: [id number]    [address] [place], [place]    Home: [phone]    Guarantor: [name], [name] Insurance: [name] Medicare Complete Payer ID:  [id number]    PCP: Tyler Musa, MD Referring: Tyler Musa, MD    Appointment Facility: [facility], [facility].        * * *    08/27/2016  Progress Notes: [name]. [name], [name]    --- ---    ---        Current Medications    ---    Taking     * Centrum Silver Tablet as directed Orally     ---    * Hydrochlorothiazide 25 MG Tablet 1 tablet Orally Once a day    ---    * Omeprazole 20 MG Capsule Delayed Release 1 capsule Orally Once a day    ---    * Xanax 0.25 MG Tablet 1 tablet Orally Twice a day    ---    * Hydrocodone-Acetaminophen 5-325 MG Tablet 1 tablet as needed Orally every 6 hrs    ---    * Amiodarone HCl 200 MG Tablet 1 tablet Orally Once a day    ---    * Zestril 5 MG Tablet 1 tablet Orally Once a day    ---    Not-Taking/PRN    * Zocor 10 MG Tablet 1 tablet every evening Orally Once a day    ---    * Aspirin 81 MG Tablet Delayed Release 1 tablet Orally Once a day    ---    * Xarelto 15 MG Tablet Orally     ---    * Medication List reviewed and reconciled with the patient    ---      Past Medical History    ---       prostate cancer: Dx'd 7/06; S/P XRT: 11/06-1/07: gleason's 8.        ---    GERD.        ---    Hypercholesterolemia.        ---    Hypertension.        ---    Anxiety.        ---    Atrial Fibrillation: @ 2017.        ---    ED - Impotence of organic origin.        ---    Renal insufficiency.        ---      Surgical History    ---      Trans Rectal [place] and Prostate BX 09/2004    ---    cholecystectomy 04/2004    ---    inguinal hernia repair    ---      Social History    ---    Alcohol: yes, occasional.    no Smoking status: never smoker, Patient counseled on the dangers of tobacco  use: 08/20/2016.    no Caffeine.       Allergies    ---      N.K.D.A.    ---        Reason for Appointment    ---      1\. Hematuria    ---      History of Present Illness    ---  _Urology Follow Up_ :    Hematuria here for cystoscopy.    he is voiding well with no more hematuria.      Vital Signs    ---    Ht 5'7", Wt 164, BMI 25.68, BP 140/78, Temp 97.3.      Assessments    ---    1\. Gross hematuria - R31.0 (Primary)    ---    2\. Prostate cancer - C61    ---    3\. 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 Gross hematuria**    _LAB: PSA Diagnostic_     [name] 08/27/2016 8:43:30 AM >    --- ---        Notes: His work up was c/w with radiation cystitis: see that discussion:.        **2\. Prostate cancer**    Notes: I discussed this with the pt and his daughter at length: his PSA was 38  but may have been acutely elevated a little since it was during this acute  radiation cystitis attack. I want to repeat it in 3 months but if still up he  will need a bone scan and I discussed with both of them about the next step in  treatment: hormonal therapy I explained that this is not unusual over 11 years  out form hsi radiation and thus his radiation may have controlled the cancer  but was not a cure.        **3\. Radiation cystitis**    Notes: We discussed this as well and will observe for now but they understand  that sometimes this can get worse. Also if he needs anticoagulants for his  atrial fibrillation he is at risk more bleeding due to this.        **4\. Others**    Notes: Patient Educated with: [email or web address] ([email or web address]).    Clinical Notes: Finally, he lives in [place] and here. He has a doc in  [place] but no urologist., I strongly encouraged him to get a GU doc down there in  case he has more episodes of radiation cystitis and also since his PSA is  rising and he may eventually need hormonal therapy. He and his daughter  understand my concerns about this.      Procedures    ---     _Cystourethroscopy_ :     Indication Hematuria. Procedure Cystourethroscopy done under sterile  precautions, Patient was prepped and draped, A 15 F flexible cystoscope was  used.. Bladder No PVR, No tumors noted, Trabeculation was grade 2, bladder c/w  with radiation changes and increased vascularity, no stones. Urethra no  inflammation, no stricture, normal. Trigone ureteral orifices seen with clear  efflux, Normal. prostate small non-obstructive prostate s/p radiation.            Labs    --- ---    [name]: ClinitekStatus SG_    ---       Color  yellow/clear     \-    --- --- --- ---    GLU  Negative     \-    --- --- --- ---    BIL  Negative     \-    --- --- --- ---    KET  Negative     \-    --- --- --- ---    SG  1.020     \-    --- --- --- ---  BLO  Negative     \-    --- --- --- ---    pH  5.5     \-    --- --- --- ---    PRO  Negative     \-    --- --- --- ---    URO  0.2 [name].U./dL     \-    --- --- --- ---    NIT  Negative     \-    --- --- --- ---    LEU  Negative     \-    --- --- --- ---         [name] 08/27/2016 7:47:06 AM > [name] 08/27/2016  8:13:23 AM >    --- ---      Procedure Codes    ---      91478 UA Automated    ---    52000 CYSTO    ---      Follow Up    ---    routine OV, 3 Monthsw/ PSA first    Electronically signed by [name] , MD on 08/27/2016 at 08:49 AM EDT    Sign off status: Completed        * * [facility]    [address]    [place], [place] [id number]    Tel: [phone]    Fax: [phone]              * * *          [name] DOB: Feb 06, 1932 Progress Note: [name].  [name], [name] 08/27/2016    ---    Note generated by eClinicalWorks EMR/PM Software ([email or web address])

## 2016-08-28 LAB — HX BENZODIAZEPINES CONFIRM URINE
CASE NUMBER: 2018164002998
HX U 7-AMINOCLONAZEPAM: <5
HX U ALPHA-HYDROXYALPRAZOLAM: 7 ng/mL
HX U ALPHA-HYDROXYMIDAZOLAM: <20
HX U ALPRAZOLAM: 8 ng/mL
HX U CHLORDIAZEPOXIDE: <20
HX U CLONAZEPAM: <5
HX U DIAZEPAM: <20
HX U LORAZEPAM: <20
HX U MIDAZOLAM: <20
HX U NORDIAZEPAM: <20
HX U OXAZEPAM: <20
HX U TEMAZEPAM: <20

## 2016-09-11 ENCOUNTER — Ambulatory Visit: Admitting: Cardiovascular Disease

## 2016-09-11 LAB — HX T3 TOTAL
CASE NUMBER: 2018186002081
HX T3 TOTAL: 65 ng/dL — NL (ref 60.0–181.0)

## 2016-09-11 LAB — HX CBC W/ DIFF
CASE NUMBER: 2018186002081
HX HCT: 39.2 % — NL (ref 39.0–50.0)
HX HGB: 13.1 g/dL — NL (ref 13.0–17.0)
HX MCH: 32 pg — ABNORMAL HIGH (ref 27.0–31.0)
HX MCHC: 33.4 g/dL — NL (ref 32.0–36.0)
HX MCV: 96 fL — ABNORMAL HIGH (ref 80.0–94.0)
HX MPV: 10.7 fL — NL (ref 7.4–11.5)
HX NRBC PERCENT: 0 % — NL
HX PLATELET: 168 10*3/uL — NL (ref 150.0–400.0)
HX RBC: 4.07 10*6/uL — ABNORMAL LOW (ref 4.2–5.4)
HX RDW: 13.8 % — NL (ref 11.5–14.5)
HX WBC: 5.2 10*3/uL — NL (ref 3.6–10.5)

## 2016-09-11 LAB — HX LIPID PANEL
CASE NUMBER: 2018186002081
HX CHOL: 216 mg/dL — ABNORMAL HIGH (ref 140.0–200.0)
HX HDL: 61 mg/dL — ABNORMAL HIGH (ref 41.0–60.0)
HX LDL: 108 mg/dL — NL (ref 0.0–129.0)
HX TRIG: 237 mg/dL — ABNORMAL HIGH (ref 0.0–149.0)

## 2016-09-11 LAB — HX .AUTOMATED DIFF
CASE NUMBER: 2018186002081
HX ABSOLUTE NEUTRO COUNT: 2990 /mm3
HX BASOPHILS: 1 % — NL (ref 0.0–1.0)
HX EOSINOPHILS: 2 % — NL (ref 0.0–3.0)
HX IMMATURE GRANULOCYTES: 0 % — NL (ref 0.0–2.0)
HX LYMPHOCYTES: 27 % — NL (ref 22.0–40.0)
HX MONOCYTES: 12 % — ABNORMAL HIGH (ref 0.0–11.0)
HX NEU CT MEASURED: 2.99
HX NEUTROPHILS: 58 % — NL (ref 40.0–71.0)

## 2016-09-11 LAB — HX GLUCOSE RANDOM
CASE NUMBER: 2018186002081
HX GLUCOSE RANDOM: 85 mg/dL — NL (ref 65.0–110.0)

## 2016-09-11 LAB — HX T4
CASE NUMBER: 2018186002081
HX T4: 11.6 ug/dL — NL (ref 4.7–13.3)

## 2016-09-11 LAB — HX TSH
CASE NUMBER: 2018186002081
HX 3RD GEN TSH: 1.34 u[IU]/mL — NL (ref 0.358–3.74)

## 2016-10-08 ENCOUNTER — Encounter: Payer: Self-pay | Admitting: Internal Medicine

## 2016-10-08 ENCOUNTER — Ambulatory Visit (INDEPENDENT_AMBULATORY_CARE_PROVIDER_SITE_OTHER): Payer: Medicare Other | Admitting: Internal Medicine

## 2016-10-08 VITALS — BP 132/84 | HR 60 | Temp 97.9°F | Resp 12 | Ht 67.0 in | Wt 163.0 lb

## 2016-10-08 DIAGNOSIS — Z79899 Other long term (current) drug therapy: Secondary | ICD-10-CM

## 2016-10-08 DIAGNOSIS — G500 Trigeminal neuralgia: Secondary | ICD-10-CM | POA: Diagnosis not present

## 2016-10-08 DIAGNOSIS — R7303 Prediabetes: Secondary | ICD-10-CM | POA: Diagnosis not present

## 2016-10-08 DIAGNOSIS — I10 Essential (primary) hypertension: Secondary | ICD-10-CM

## 2016-10-08 DIAGNOSIS — N3001 Acute cystitis with hematuria: Secondary | ICD-10-CM

## 2016-10-08 MED ORDER — CARBAMAZEPINE 200 MG PO TABS: ORAL_TABLET | ORAL | 3 refills | Status: DC

## 2016-10-08 NOTE — Patient Instructions (Signed)

## 2016-10-08 NOTE — Progress Notes (Signed)
This very nice 83 y.o. DWM "snowbird" from [place]  Just recently returned to [place] to cohabitate with his GF.  Patient was seen here initially in Dec 2016  after recent dx/o Rt Virl Axe Shingles/Bell's palsy with he still has c/o persistent discomfort /pain that he describes as intense itching. He gets partial relief on Gabapentin 100 mg 2 x/day.      Patient has hx/o labile HTN & BP has been controlled at home. Today's BP is at goal - 132/84.  Patient Has hx Prostate Cancer circa 2000 - operated and tx'd with radiation, but he seems unclear about any details and is asked to obtain records. He also has hx/ pAfib and is on Amidarone and had been on Xarelto til recently d/c'd for concerns of blood in his urine. By his description , it is suspected that he had a cystoscopy , but he's very unclear of the details. Patient has had no complaints of any cardiac type chest pain, palpitations, dyspnea/orthopnea/PND, dizziness, claudication, or dependent edema.     Hyperlipidemia is not controlled with diet as he has been reticent to taking medications in the past. . Last Lipids were nl Total Chol with very elevated Trig's: Lab Results  Component Value Date   CHOL 178 03/01/2015   HDL 47 03/01/2015   LDLCALC NOT CALC 03/01/2015   TRIG 584 (H) 03/01/2015   CHOLHDL 3.8 03/01/2015      Also, the patient has history of PreDiabetes at least since Dec 2016 with an A1c 6.3%  and has had no symptoms of reactive hypoglycemia, diabetic polys, paresthesias or visual blurring.      Further, the patient also has history of Vitamin D Deficiency and supplements vitamin D without any suspected side-effects. Last vitamin D was  At goal:  Current Outpatient Prescriptions on File Prior to Visit  Medication Sig  . ALPRAZolam (XANAX) 1 MG tablet Take 1 mg by mouth. Take 1/2 pill in the am and 1 pill qhs  . amiodarone (PACERONE) 200 MG tablet Take 200 mg by mouth daily.  [name] Kitchen aspirin EC 81 MG tablet Take 81 mg by  mouth daily.  [name] Kitchen gabapentin (NEURONTIN) 100 MG capsule TAKE ONE CAPSULE BY MOUTH THREE TIMES DAILY  . hydrochlorothiazide (MICROZIDE) 12.5 MG capsule Take 12.5 mg by mouth daily.  . Multiple Vitamins-Minerals (CENTRUM SILVER ADULT 50+ PO) Take by mouth.  [name] Kitchen omeprazole (PRILOSEC) 20 MG capsule Take 20 mg by mouth daily.  [name] Kitchen triamcinolone cream (KENALOG) 0.1 % Apply 1 application topically 2 (two) times daily.   No current facility-administered medications on file prior to visit.    Allergies  Allergen Reactions  . Ace Inhibitors Swelling    angioedema   PMHx:   Past Medical History:  Diagnosis Date  . Arthritis   . Cancer ([place])   . Hyperlipidemia   . Prostate cancer ([place])   . Prostate cancer ([place])    Immunization History  Administered Date(s) Administered  . Influenza, High Dose Seasonal PF 02/19/2015   Past Surgical History:  Procedure Laterality Date  . CATARACT EXTRACTION    . EYE SURGERY Bilateral    IOL/CE on Lt in 1998 and Rt in 2011.   FHx:    Reviewed / unchanged  SHx:    Reviewed / unchanged  Systems Review:  Constitutional: Denies fever, chills, wt changes, headaches, insomnia, fatigue, night sweats, change in appetite. Eyes: Denies redness, blurred vision, diplopia, discharge, itchy, watery eyes.  ENT: Denies discharge, congestion,  post nasal drip, epistaxis, sore throat, earache, hearing loss, dental pain, tinnitus, vertigo, sinus pain, snoring.  CV: Denies chest pain, palpitations, irregular heartbeat, syncope, dyspnea, diaphoresis, orthopnea, PND, claudication or edema. Respiratory: denies cough, dyspnea, DOE, pleurisy, hoarseness, laryngitis, wheezing.  Gastrointestinal: Denies dysphagia, odynophagia, heartburn, reflux, water brash, abdominal pain or cramps, nausea, vomiting, bloating, diarrhea, constipation, hematemesis, melena, hematochezia  or hemorrhoids. Genitourinary: Denies dysuria, frequency, urgency, nocturia, hesitancy, discharge, hematuria or flank  pain. Musculoskeletal: Denies arthralgias, myalgias, stiffness, jt. swelling, pain, limping or strain/sprain.  Skin: Denies pruritus, rash, hives, warts, acne, eczema or change in skin lesion(s). Neuro: No weakness, tremor, incoordination, spasms, paresthesia or pain. Psychiatric: Denies confusion, memory loss or sensory loss. Endo: Denies change in weight, skin or hair change.  Heme/Lymph: No excessive bleeding, bruising or enlarged lymph nodes.  Physical Exam  BP 132/84   Pulse 60   Temp 97.9 F (36.6 C)   Resp 12   Ht 5\' 7"  (1.702 m)   Wt 163 lb (73.9 kg)   BMI 25.53 kg/m   Appears well nourished, well groomed  and in no distress.  Eyes: PERRLA, EOMs, conjunctiva no swelling or erythema. Sinuses: No frontal/maxillary tenderness ENT/Mouth: EAC's clear, TM's nl w/o erythema, bulging. Nares clear w/o erythema, swelling, exudates. Oropharynx clear without erythema or exudates. Oral hygiene is good. Tongue normal, non obstructing. Hearing intact.  Neck: Supple. Thyroid nl. Car 2+/2+ without bruits, nodes or JVD. Chest: Respirations nl with BS clear & equal w/o rales, rhonchi, wheezing or stridor.  Cor: Heart sounds normal w/ regular rate and rhythm without sig. murmurs, gallops, clicks or rubs. Peripheral pulses normal and equal  without edema.  Abdomen: Soft & bowel sounds normal. Non-tender w/o guarding, rebound, hernias, masses or organomegaly.  Lymphatics: Unremarkable.  Musculoskeletal: Full ROM all peripheral extremities, joint stability, 5/5 strength and normal gait.  Skin: Warm, dry without exposed rashes, lesions or ecchymosis apparent.  Neuro: Cranial nerves intact, reflexes equal bilaterally. Sensory-motor testing grossly intact. Tendon reflexes grossly intact.  Pysch: Alert & oriented x 3.  Insight and judgement nl & appropriate. No ideations.  Assessment and Plan:  1. Tic [place]  - try Tegretol 100 mg bid   2. Essential hypertension  Continue medication,  monitor blood pressure at home.  - Continue DASH diet. Reminder to go to the ER if any CP,  SOB, nausea, dizziness, severe HA, changes vision/speech.  - CBC with Differential/Platelet - BASIC METABOLIC PANEL WITH GFR  3. Prediabetes  - Continue diet, exercise, lifestyle modifications.  - Monitor appropriate labs. - Continue supplementation.  - Hemoglobin A1c  4. Hematuria due to acute cystitis ? Radiation or infection - Obtain Urological records before refer to local Urology - Urinalysis, Routine w reflex microscopic - Urine Culture  5. Medication management  - CBC with Differential/Platelet - BASIC METABOLIC PANEL WITH GFR - Hemoglobin A1c - Urinalysis, Routine w reflex microscopic - Urine Culture      Discussed  regular exercise, BP monitoring, weight control to achieve/maintain BMI less than 25 and discussed med and SE's. Recommended labs to assess and monitor clinical status with further disposition pending results of labs. Over 30 minutes of exam, counseling, chart review was performed.

## 2016-10-09 LAB — CBC WITH DIFFERENTIAL/PLATELET
BASOS ABS: 0 {cells}/uL (ref 0–200)
BASOS PCT: 0 %
EOS PCT: 3 %
Eosinophils Absolute: 177 cells/uL (ref 15–500)
HCT: 36.6 % — ABNORMAL LOW (ref 38.5–50.0)
HEMOGLOBIN: 12.1 g/dL — AB (ref 13.2–17.1)
Lymphocytes Relative: 24 %
Lymphs Abs: 1416 cells/uL (ref 850–3900)
MCH: 32.7 pg (ref 27.0–33.0)
MCHC: 33.1 g/dL (ref 32.0–36.0)
MCV: 98.9 fL (ref 80.0–100.0)
MONOS PCT: 11 %
MPV: 9.9 fL (ref 7.5–12.5)
Monocytes Absolute: 649 cells/uL (ref 200–950)
NEUTROS ABS: 3658 {cells}/uL (ref 1500–7800)
Neutrophils Relative %: 62 %
Platelets: 197 10*3/uL (ref 140–400)
RBC: 3.7 MIL/uL — AB (ref 4.20–5.80)
RDW: 14.4 % (ref 11.0–15.0)
WBC: 5.9 10*3/uL (ref 3.8–10.8)

## 2016-10-10 ENCOUNTER — Other Ambulatory Visit: Payer: Self-pay | Admitting: Internal Medicine

## 2016-10-10 DIAGNOSIS — N289 Disorder of kidney and ureter, unspecified: Secondary | ICD-10-CM

## 2016-10-10 LAB — BASIC METABOLIC PANEL WITH GFR
BUN: 27 mg/dL — ABNORMAL HIGH (ref 7–25)
CO2: 25 mmol/L (ref 20–31)
CREATININE: 2.16 mg/dL — AB (ref 0.70–1.11)
Calcium: 9.1 mg/dL (ref 8.6–10.3)
Chloride: 105 mmol/L (ref 98–110)
GFR, EST NON AFRICAN AMERICAN: 27 mL/min — AB (ref >=60)
GFR, Est African American: 32 mL/min — ABNORMAL LOW (ref >=60)
Glucose, Bld: 90 mg/dL (ref 65–99)
POTASSIUM: 4.6 mmol/L (ref 3.5–5.3)
SODIUM: 140 mmol/L (ref 135–146)

## 2016-10-10 LAB — URINALYSIS, ROUTINE W REFLEX MICROSCOPIC
Bilirubin Urine: NEGATIVE
GLUCOSE, UA: NEGATIVE
Nitrite: POSITIVE — AB
Specific Gravity, Urine: 1.025 (ref 1.001–1.035)
pH: 5.5 (ref 5.0–8.0)

## 2016-10-10 LAB — URINALYSIS, MICROSCOPIC ONLY
CRYSTALS: NONE SEEN [HPF]
Casts: NONE SEEN [LPF]
Squamous Epithelial / LPF: NONE SEEN [HPF] (ref <=5)
Yeast: NONE SEEN [HPF]

## 2016-10-10 LAB — HEMOGLOBIN A1C
HEMOGLOBIN A1C: 5.6 % (ref <5.7)
MEAN PLASMA GLUCOSE: 114 mg/dL

## 2016-10-12 ENCOUNTER — Other Ambulatory Visit: Payer: Self-pay | Admitting: Internal Medicine

## 2016-10-12 LAB — URINE CULTURE

## 2016-10-12 MED ORDER — NITROFURANTOIN MONOHYD MACRO 100 MG PO CAPS: ORAL_CAPSULE | ORAL | 0 refills | Status: DC

## 2016-10-13 ENCOUNTER — Ambulatory Visit: Admitting: Urology

## 2016-10-13 NOTE — Progress Notes (Signed)
* * *        **  [name]**    --- ---    23 Y old Male, DOB: 03/20/1931    [address] [place], [place] [address]    Home: [phone]    Provider: [name]        * * *    Telephone Encounter    ---    Answered by   Jaynie Crumble  Date: 10/13/2016         Time: 11:48 AM    Reason   verify address    --- ---            Message                      and let him know that a records release has been sent to the office   and how far back are we going to send   asked for call back                 Action Taken   [name],[name] 10/13/2016 11:49:14 AM > [name],[name] 10/13/2016 4:51:55  PM > called pt again left another message [name],[name] 10/14/2016 11:11:45 AM >  spoke to pt he h as moved to [place] and is see another dr there                * * *                ---          * * *          Patient: [name] DOB: [birth year] Provider: [name]  10/13/2016    ---    Note generated by eClinicalWorks EMR/PM Software ([email or web address])

## 2016-10-14 ENCOUNTER — Other Ambulatory Visit: Payer: Self-pay | Admitting: *Deleted

## 2016-10-14 MED ORDER — AMOXICILLIN-POT CLAVULANATE 875-125 MG PO TABS: 1.0000 | ORAL_TABLET | Freq: Two times a day (BID) | ORAL | 0 refills | Status: DC

## 2016-10-16 ENCOUNTER — Other Ambulatory Visit: Payer: Self-pay | Admitting: Internal Medicine

## 2016-10-16 ENCOUNTER — Ambulatory Visit (INDEPENDENT_AMBULATORY_CARE_PROVIDER_SITE_OTHER): Payer: Medicare Other | Admitting: *Deleted

## 2016-10-16 DIAGNOSIS — N289 Disorder of kidney and ureter, unspecified: Secondary | ICD-10-CM

## 2016-10-16 DIAGNOSIS — N183 Chronic kidney disease, stage 3 unspecified: Secondary | ICD-10-CM | POA: Insufficient documentation

## 2016-10-16 LAB — BASIC METABOLIC PANEL WITH GFR
BUN: 27 mg/dL — AB (ref 7–25)
CALCIUM: 9 mg/dL (ref 8.6–10.3)
CO2: 26 mmol/L (ref 20–32)
CREATININE: 1.73 mg/dL — AB (ref 0.70–1.11)
Chloride: 104 mmol/L (ref 98–110)
GFR, EST AFRICAN AMERICAN: 41 mL/min — AB (ref >=60)
GFR, Est Non African American: 36 mL/min — ABNORMAL LOW (ref >=60)
GLUCOSE: 81 mg/dL (ref 65–99)
Potassium: 4.7 mmol/L (ref 3.5–5.3)
Sodium: 139 mmol/L (ref 135–146)

## 2016-10-16 NOTE — Progress Notes (Signed)
Patient here for a NV and STAT BMET due to abnormal kidney functions.  The patient states he has stopped his fluid medication and increased his fluid intake.  He has also started his antibiotics for his UTI.

## 2016-10-23 ENCOUNTER — Ambulatory Visit (INDEPENDENT_AMBULATORY_CARE_PROVIDER_SITE_OTHER): Payer: Medicare Other | Admitting: *Deleted

## 2016-10-23 ENCOUNTER — Other Ambulatory Visit: Payer: Self-pay | Admitting: Internal Medicine

## 2016-10-23 DIAGNOSIS — N289 Disorder of kidney and ureter, unspecified: Secondary | ICD-10-CM | POA: Diagnosis not present

## 2016-10-23 DIAGNOSIS — N189 Chronic kidney disease, unspecified: Secondary | ICD-10-CM

## 2016-10-23 DIAGNOSIS — R310 Gross hematuria: Secondary | ICD-10-CM

## 2016-10-23 LAB — BASIC METABOLIC PANEL WITH GFR
BUN: 22 mg/dL (ref 7–25)
CALCIUM: 9.1 mg/dL (ref 8.6–10.3)
CO2: 26 mmol/L (ref 20–32)
CREATININE: 1.77 mg/dL — AB (ref 0.70–1.11)
Chloride: 103 mmol/L (ref 98–110)
GFR, EST AFRICAN AMERICAN: 40 mL/min — AB (ref >=60)
GFR, Est Non African American: 35 mL/min — ABNORMAL LOW (ref >=60)
GLUCOSE: 132 mg/dL — AB (ref 65–99)
Potassium: 5.2 mmol/L (ref 3.5–5.3)
Sodium: 139 mmol/L (ref 135–146)

## 2016-10-23 NOTE — Progress Notes (Signed)
Patient here for a 1 week NV to draw a stat BMET, He states he continues to hold his HCTZ and is drinking an increased amount of water and juice. His weight today is 168 pounds, up 5 pounds since 10/08/2016 OV.

## 2016-10-28 ENCOUNTER — Other Ambulatory Visit: Payer: Self-pay | Admitting: Internal Medicine

## 2016-10-28 DIAGNOSIS — N289 Disorder of kidney and ureter, unspecified: Secondary | ICD-10-CM

## 2016-11-03 ENCOUNTER — Encounter (HOSPITAL_COMMUNITY): Payer: Self-pay | Admitting: Emergency Medicine

## 2016-11-03 ENCOUNTER — Ambulatory Visit
Admission: RE | Admit: 2016-11-03 | Discharge: 2016-11-03 | Disposition: A | Discharge status: Home / Self Care | Payer: Medicare Other | Source: Ambulatory Visit | Attending: Internal Medicine | Admitting: Internal Medicine

## 2016-11-03 ENCOUNTER — Emergency Department (HOSPITAL_COMMUNITY)
Admission: EM | Admit: 2016-11-03 | Discharge: 2016-11-03 | Disposition: A | Discharge status: Home / Self Care | Payer: Medicare Other | Attending: Emergency Medicine | Admitting: Emergency Medicine

## 2016-11-03 ENCOUNTER — Emergency Department (HOSPITAL_COMMUNITY): Payer: Medicare Other

## 2016-11-03 DIAGNOSIS — I10 Essential (primary) hypertension: Secondary | ICD-10-CM | POA: Diagnosis not present

## 2016-11-03 DIAGNOSIS — M25551 Pain in right hip: Secondary | ICD-10-CM | POA: Insufficient documentation

## 2016-11-03 DIAGNOSIS — M545 Low back pain, unspecified: Secondary | ICD-10-CM

## 2016-11-03 DIAGNOSIS — N289 Disorder of kidney and ureter, unspecified: Secondary | ICD-10-CM

## 2016-11-03 DIAGNOSIS — Z79899 Other long term (current) drug therapy: Secondary | ICD-10-CM | POA: Diagnosis not present

## 2016-11-03 DIAGNOSIS — Z87891 Personal history of nicotine dependence: Secondary | ICD-10-CM | POA: Insufficient documentation

## 2016-11-03 DIAGNOSIS — W19XXXA Unspecified fall, initial encounter: Secondary | ICD-10-CM | POA: Diagnosis not present

## 2016-11-03 MED ORDER — ACETAMINOPHEN 325 MG PO TABS
650.0000 mg | ORAL_TABLET | Freq: Once | ORAL | Status: AC
  Administered 2016-11-03: 650 mg via ORAL
  Filled 2016-11-03: qty 2

## 2016-11-03 NOTE — ED Notes (Signed)
See EDP secondary assessment.  

## 2016-11-03 NOTE — ED Provider Notes (Signed)
[facility] DEPT Provider Note   CSN: [id number] Arrival date & time: 11/03/16  1307     History   Chief Complaint Chief Complaint  Patient presents with  . Fall    HPI Logan Brown is a 83 y.o. male who presents with right lower back and right hip pain that began this morning after mechanical fall in a partially 7:30 AM. Patient states that he was getting out of the back that up when he tripped, falling on the floor. He states that he did not hit his head and denies any LOC. He is able to recall the entire event. Patient states that he is experiencing right hip pain since then. He has been able in delay without any difficulty. He has not taken any medications for the pain. Patient denies anynumbness/weakness of his arms or legs. Patient denies any vomiting, chest pain, abdominal pain, difficulty breathing, vision changes.  The history is provided by the patient.    Past Medical History:  Diagnosis Date  . Arthritis   . Cancer ([place])   . Hyperlipidemia   . Prostate cancer ([place])   . Prostate cancer [facility])     Patient Active Problem List   Diagnosis Date Noted  . Renal insufficiency 10/16/2016  . 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 HTN (hypertension) 03/01/2015  . Mixed hyperlipidemia 03/01/2015  . Elevated blood sugar 03/01/2015  . Vitamin D deficiency 03/01/2015  . Medication management 03/01/2015    Past Surgical History:  Procedure Laterality Date  . CATARACT EXTRACTION    . EYE SURGERY Bilateral    IOL/CE on Lt in 1998 and Rt in 2011.       Home Medications    Prior to Admission medications   Medication Sig Start Date End Date Taking? Authorizing Provider  ALPRAZolam Duanne Moron) 1 MG tablet Take 1 mg by mouth. Take 1/2 pill in the am and 1 pill qhs    [provider]  amiodarone (PACERONE) 200 MG tablet Take 200 mg by mouth daily.    [provider]  amoxicillin-clavulanate (AUGMENTIN) 875-125 MG tablet Take 1 tablet by  mouth 2 (two) times daily. 10/14/16   [name], MD  aspirin EC 81 MG tablet Take 81 mg by mouth daily.    [provider]  carbamazepine (TEGRETOL) 200 MG tablet Take 1 tablet 2 x/ day 10/08/16 01/08/17  [name], MD  gabapentin (NEURONTIN) 100 MG capsule TAKE ONE CAPSULE BY MOUTH THREE TIMES DAILY 01/30/16   Vicie Mutters, PA-C  hydrochlorothiazide (MICROZIDE) 12.5 MG capsule Take 12.5 mg by mouth daily.    [provider]  Multiple Vitamins-Minerals (CENTRUM SILVER ADULT 50+ PO) Take by mouth.    [provider]  omeprazole (PRILOSEC) 20 MG capsule Take 20 mg by mouth daily.    [provider]  triamcinolone cream (KENALOG) 0.1 % Apply 1 application topically 2 (two) times daily. 01/30/16   Vicie Mutters, PA-C    Family History History reviewed. No pertinent family history.  Social History Social History  Substance Use Topics  . Smoking status: Former Smoker    Quit date: 03/14/1975  . Smokeless tobacco: Never Used  . Alcohol use No     Comment: occ     Allergies   Ace inhibitors   Review of Systems Review of Systems  Eyes: Negative for visual disturbance.  Respiratory: Negative for shortness of breath.   Cardiovascular: Negative for chest pain.  Musculoskeletal: Positive for back pain.  Hip pain  Neurological: Negative for weakness and numbness.     Physical Exam Updated Vital Signs BP (!) 150/116 (BP Location: Left Arm)   Pulse 60   Temp 98.6 F (37 C) (Oral)   Resp 17   Ht 5\' 7"  (1.702 m)   Wt 74.4 kg (164 lb)   SpO2 98%   BMI 25.69 kg/m   Physical Exam  Constitutional: He is oriented to person, place, and time. He appears well-developed and well-nourished.  HENT:  Head: Normocephalic and atraumatic.  Mouth/Throat: Oropharynx is clear and moist and mucous membranes are normal.  Eyes: Pupils are equal, round, and reactive to light. Conjunctivae, EOM and lids are normal.  Neck: Full passive range of  motion without pain.  Full flexion/extension and lateral movement of neck fully intact. No bony midline tenderness. No deformities or crepitus.    Cardiovascular: Normal rate, regular rhythm, normal heart sounds and normal pulses.  Exam reveals no gallop and no friction rub.   No murmur heard. Pulmonary/Chest: Effort normal and breath sounds normal.  Abdominal: Soft. Normal appearance. There is no tenderness. There is no rigidity and no guarding.  Musculoskeletal: Normal range of motion.       Thoracic back: He exhibits no tenderness.       Lumbar back: He exhibits no tenderness.       Back:  Tenderness overlying the right paraspinal muscles of the lumbar region that extends over to the right side/hip. Tenderness to palpation overlying the right iliac crest. No deformity or crepitus. No overlying warmth, erythema ecchymosis. No shortening or rotation of RLE. Flexion/extension of RLE without difficulty. No tenderness overlying the right femur. No tenderness to palpation of the right knee, ankle. Flexion/extension of back intact.   Neurological: He is alert and oriented to person, place, and time. GCS eye subscore is 4. GCS verbal subscore is 5. GCS motor subscore is 6.  Cranial nerves III-XII intact Follows commands, Moves all extremities  5/5 strength to BUE and BLE  Sensation intact throughout all major nerve distributions Normal finger to nose. No dysdiadochokinesia. No pronator drift. No gait abnormalities  No slurred speech. No facial droop.   Skin: Skin is warm and dry. Capillary refill takes less than 2 seconds.  Psychiatric: He has a normal mood and affect. His speech is normal.  Nursing note and vitals reviewed.    ED Treatments / Results  Labs (all labs ordered are listed, but only abnormal results are displayed) Labs Reviewed - No data to display  EKG  EKG Interpretation None       Radiology Dg Lumbar Spine Complete  Result Date: 11/03/2016 CLINICAL DATA:  Low  back and right hip pain.  Fell. EXAM: LUMBAR SPINE - COMPLETE 4+ VIEW COMPARISON:  None. FINDINGS: Normal alignment of the lumbar vertebral bodies. Advanced degenerative lumbar spondylosis with multilevel disc disease and facet disease. No acute fractures identified. No definite pars defects. The visualized bony pelvis appears intact. IMPRESSION: Advanced degenerative changes but no acute bony findings. Electronically Signed   By: Marijo Sanes [name]   On: 11/03/2016 16:30   Ct Head Wo Contrast  Result Date: 11/03/2016 CLINICAL DATA:  [place] on wet floor. History of prostate cancer, hypertension hyperlipidemia. EXAM: CT HEAD WITHOUT CONTRAST TECHNIQUE: Contiguous axial images were obtained from the base of the skull through the vertex without intravenous contrast. COMPARISON:  None. FINDINGS: BRAIN: No intraparenchymal hemorrhage, mass effect nor midline shift. The ventricles and sulci are normal for age. Patchy supratentorial  white matter hypodensities less than expected for patient's age, though non-specific are most compatible with chronic small vessel ischemic disease. No acute large vascular territory infarcts. No abnormal extra-axial fluid collections. Basal cisterns are patent. VASCULAR: Mild calcific atherosclerosis of the carotid siphons. SKULL: No skull fracture. No significant scalp soft tissue swelling. SINUSES/ORBITS: Minimal secretions LEFT sphenoid sinus without air-fluid levels. The included ocular globes and orbital contents are non-suspicious. Status post bilateral ocular lens implants. OTHER: None. IMPRESSION: No acute intracranial process ; negative noncontrast CT HEAD for age. Electronically Signed   By: Elon Alas [name]   On: 11/03/2016 16:10   US Renal  Result Date: 11/03/2016 CLINICAL DATA:  Renal insufficiency. EXAM: RENAL / URINARY TRACT ULTRASOUND COMPLETE COMPARISON:  None in PACs FINDINGS: Right Kidney: Length: 10.1 cm. The renal cortical echotexture remains lower than that of  the liver. There is no discrete mass or hydronephrosis. Left Kidney: Length: 10.0 cm. The renal cortical echotexture is similar to that of the right kidney. There is no mass or hydronephrosis. Bladder: Appears normal for degree of bladder distention. Bilateral ureteral jets are observed. IMPRESSION: There is no acute abnormality of the kidneys nor urinary bladder. Electronically Signed   By: David  Martinique [name]   On: 11/03/2016 13:47   Dg Hip Unilat W Or Wo Pelvis 2-3 Views Right  Result Date: 11/03/2016 CLINICAL DATA:  Low back and right hip pain.  Fell today. EXAM: DG HIP (WITH OR WITHOUT PELVIS) 2-3V RIGHT COMPARISON:  None. FINDINGS: Both hips are normally located. Moderate degenerative changes bilaterally. No acute hip fractures identified. The pubic symphysis and SI joints are intact. No definite pelvic fractures or bone lesions. IMPRESSION: No acute hip or pelvic fractures. Electronically Signed   By: Marijo Sanes [name]   On: 11/03/2016 16:31    Procedures Procedures (including critical care time)  Medications Ordered in ED Medications  acetaminophen (TYLENOL) tablet 650 mg (650 mg Oral Given 11/03/16 1737)     Initial Impression / Assessment and Plan / ED Course  I have reviewed the triage vital signs and the nursing notes.  Pertinent labs & imaging results that were available during my care of the patient were reviewed by me and considered in my medical decision making (see chart for details).     83 year old male who presents with right hip and right lower back pain that began this morning after mechanical fall. History of LOC. Has been able to ambulate without difficulty. Patient is afebrile, non-toxic appearing, sitting comfortably on examination table. Vital signs reviewed and stable. Tenderness palpation over the right hip. No deformity or crepitus noted. He does also have some right paraspinal muscle tenderness to the lumbar region. Plan obtain x-ray imaging for further  evaluation. Patient states that he was previously on Xarelto but thinks he was removed from it approximately one month ago but is unsure. I see no mention of Xarelto in current meds but will obtain CT head for evaluation.  XRs reviewed. Negative for any acute hemorrhage. Lumbar spine XR negative for any fracture. Right hip XR negative for any fracture or dislocation. Discussed results with patient. Repeat exam shows no gait abnormalities. Patient is able to bear weight without any difficulties. Conservative therapies discussed. Patient instructed to follow-up with his PCP in 24-48 hours for further evaluation. Instructed patient to follow-up with his PCP or the ED sooner if he experiences worsening or persistent pain, difficulty ambulating. Strict return precautions discussed. Patient expresses understanding and agreement to plan.  Final Clinical Impressions(s) / ED Diagnoses   Final diagnoses:  Fall, initial encounter  Acute right-sided low back pain without sciatica  Pain of right hip joint    New Prescriptions Discharge Medication List as of 11/03/2016  5:29 PM       Volanda Napoleon, PA-C 11/03/16 2157    Pattricia Boss, MD 11/06/16 [phone]

## 2016-11-03 NOTE — ED Triage Notes (Signed)
Pt to ER for evaluation of right lateral lumbar back pain after mechanical fall when getting out of the shower this morning. Able to ambulate. Denies numbness/tingling in extremities. A/o x4. NAD.

## 2016-11-03 NOTE — ED Notes (Signed)
Patient transported to CT 

## 2016-11-03 NOTE — Discharge Instructions (Signed)
As we discussed, her x-rays do not show any fracture today. If he continued to have pain or the pain worsens or you have difficulty walking, he needs to either return to the emergency department or follow up with your primary care doctor. He may need another x-ray to make sure there wasn't a fracture.  You can take Tylenol  as directed for pain.  As we discussed, he can apply ice to the affected area for the next few days. After that he can apply heat to the affected area.  Return to the emergency department for any worsening pain, difficulty walking, numbness/weakness of the arms or legs, chest pain, difficulty breathing or any other worsening or concerning symptoms.

## 2016-11-04 NOTE — Progress Notes (Signed)
LVM for pt to return office call for LAB results.

## 2016-11-05 ENCOUNTER — Telehealth: Payer: Self-pay | Admitting: *Deleted

## 2016-11-05 MED ORDER — TRAMADOL HCL 50 MG PO TABS: ORAL_TABLET | ORAL | 0 refills | Status: DC

## 2016-11-05 NOTE — Telephone Encounter (Signed)
Patient called and states he fell and went the [facility] on 11/03/2016 for pain in his back. He was told his x-ray was normal, but he is still having pain. Per Dr Melford Aase, he can try Tylenol 325 mg 2 tablets 3 times a day and an RX for Tramadol 50 mg was sent in for break through  pain.  He was advised to try 12 tablet of Tramadol. only if Tylenol does not relieve the pain in about 2 hours after taking it.

## 2016-11-16 DIAGNOSIS — N390 Urinary tract infection, site not specified: Secondary | ICD-10-CM | POA: Insufficient documentation

## 2016-11-16 NOTE — Progress Notes (Signed)
  Subjective:    Patient ID: Logan Brown, male    DOB: Oct 25, 1931, 83 y.o.   MRN: [id number]  HPI  Patient is seen now in 5 week f/u Rt facial pain  which is better on Gabapentin and Tegretol. He was treated for a resistant Ecoli UTI w/Augmentin 5 weeks ago. Also, his kidney functions were worse with Creat rising from 1.24 to 2.16 with GFR dropping from 54 to 27 and recheck 1 week later after forcing fluids showed sl improved Creat  1.73 and GFR 36.  A1c was improved from 6.3% to 5.6%. Patient was evaluated on 11/03/2016 in the ER after a fall with neg X-rays and a Dx of LBP/Rt Hip pain.   Medication Sig  . ALPRAZolam (XANAX) 1 MG tablet Take 1/2 pill in the am and 1 pill qhs  . amiodarone (PACERONE) 200 MG tablet Take 200 mg by mouth daily.  . carbamazepine (TEGRETOL) 200 MG tablet Take 1 tablet 2 x/ day  . gabapentin (NEURONTIN) 100 MG capsule TAKE ONE CAP THREE TIMES DAILY  . Multiple Vitamins-Minerals (CENTRUM SILVER ADULT 50+ PO) Take by mouth.  [name] Kitchen omeprazole (PRILOSEC) 20 MG capsule Take 20 mg by mouth daily.  . traMADol (ULTRAM) 50 MG tablet Take 1/2 to 1 tab qid for pain prn  . triamcinolone cream (KENALOG) 0.1 % Apply 1 application topically 2 (two) times daily.   Allergies  Allergen Reactions  . Ace Inhibitors Swelling    angioedema   Review of Systems  10 point systems review negative except as above and also he's c/o LBP     Objective:   Physical Exam  BP 130/80   Pulse (!) 56   Temp 97.9 F (36.6 C)   Resp 14   Ht 5\' 7"  (1.702 m)   Wt 166 lb (75.3 kg)   SpO2 97%   BMI 26.00 kg/m   HEENT - WNL. Neck - supple.  Chest - Clear equal BS. Cor - Nl HS. RRR w/o sig MGR. PP 1(+). No edema. MS- FROM w/o deformities.  Gait Nl.Sl tender para lumbar spasm. Neuro -  Nl w/o focal abnormalities.    Assessment & Plan:   1. Essential hypertension  - BASIC METABOLIC PANEL WITH GFR  2. Renal insufficiency  - BASIC METABOLIC PANEL WITH GFR  3. Urinary tract infection  without hematuria, site unspecified  - Urinalysis, Routine w reflex microscopic - Urine Culture  4. Medication management  - BASIC METABOLIC PANEL WITH GFR  5. LBP  - Rx Prednisone 20 mg #20 taper ++++++++++++++++++++++++++++++++++++++  - ROV in Feb & Mar to monitor renal functions and U/C.

## 2016-11-17 ENCOUNTER — Encounter: Payer: Self-pay | Admitting: Internal Medicine

## 2016-11-17 ENCOUNTER — Ambulatory Visit (INDEPENDENT_AMBULATORY_CARE_PROVIDER_SITE_OTHER): Payer: Medicare Other | Admitting: Internal Medicine

## 2016-11-17 VITALS — BP 130/80 | HR 56 | Temp 97.9°F | Resp 14 | Ht 67.0 in | Wt 166.0 lb

## 2016-11-17 DIAGNOSIS — Z79899 Other long term (current) drug therapy: Secondary | ICD-10-CM | POA: Diagnosis not present

## 2016-11-17 DIAGNOSIS — N390 Urinary tract infection, site not specified: Secondary | ICD-10-CM | POA: Diagnosis not present

## 2016-11-17 DIAGNOSIS — M545 Low back pain, unspecified: Secondary | ICD-10-CM

## 2016-11-17 DIAGNOSIS — I10 Essential (primary) hypertension: Secondary | ICD-10-CM

## 2016-11-17 DIAGNOSIS — N289 Disorder of kidney and ureter, unspecified: Secondary | ICD-10-CM

## 2016-11-17 MED ORDER — PREDNISONE 20 MG PO TABS: ORAL_TABLET | ORAL | 0 refills | Status: DC

## 2016-11-18 LAB — URINALYSIS, ROUTINE W REFLEX MICROSCOPIC
BILIRUBIN URINE: NEGATIVE
Bacteria, UA: NONE SEEN /HPF
GLUCOSE, UA: NEGATIVE
Hgb urine dipstick: NEGATIVE
Hyaline Cast: NONE SEEN /LPF
KETONES UR: NEGATIVE
LEUKOCYTES UA: NEGATIVE
NITRITE: NEGATIVE
RBC / HPF: NONE SEEN /HPF (ref 0–2)
SPECIFIC GRAVITY, URINE: 1.026 (ref 1.001–1.03)
Squamous Epithelial / LPF: NONE SEEN /HPF (ref <=5)
WBC, UA: NONE SEEN /HPF (ref 0–5)
pH: 5.5 (ref 5.0–8.0)

## 2016-11-18 LAB — BASIC METABOLIC PANEL WITH GFR
BUN/Creatinine Ratio: 16 (calc) (ref 6–22)
BUN: 28 mg/dL — ABNORMAL HIGH (ref 7–25)
CO2: 27 mmol/L (ref 20–32)
CREATININE: 1.78 mg/dL — AB (ref 0.70–1.11)
Calcium: 9.4 mg/dL (ref 8.6–10.3)
Chloride: 103 mmol/L (ref 98–110)
GFR, EST NON AFRICAN AMERICAN: 35 mL/min/{1.73_m2} — AB (ref >=60)
GFR, Est African American: 40 mL/min/{1.73_m2} — ABNORMAL LOW (ref >=60)
GLUCOSE: 90 mg/dL (ref 65–99)
POTASSIUM: 4.5 mmol/L (ref 3.5–5.3)
SODIUM: 139 mmol/L (ref 135–146)

## 2016-11-18 LAB — URINE CULTURE
MICRO NUMBER:: 80993146
RESULT: NO GROWTH
SPECIMEN QUALITY:: ADEQUATE

## 2016-12-11 ENCOUNTER — Other Ambulatory Visit: Payer: Self-pay | Admitting: *Deleted

## 2016-12-11 DIAGNOSIS — M545 Low back pain, unspecified: Secondary | ICD-10-CM

## 2016-12-11 MED ORDER — PREDNISONE 20 MG PO TABS: ORAL_TABLET | ORAL | 0 refills | Status: DC

## 2016-12-16 ENCOUNTER — Ambulatory Visit: Admitting: Urology

## 2016-12-16 NOTE — Progress Notes (Signed)
* * *        **  [name]**    --- ---    89 Y old Male, DOB: 02-23-1932    [address] [place], [place] [address]    Home: [phone]    Provider: [name]        * * *    Telephone Encounter    ---    Answered by   [name]  Date: 12/16/2016         Time: 11:12 AM    Caller   pt    --- ---            Reason   appt tomorrow            Message                      PT called hes in [place] will not be coming to appt                 Action Taken   [name],[name] 12/16/2016 11:12:06 AM > [name],[name] 12/16/2016  11:46:55 AM > Dr Currie Paris Kalesha Irving E 12/16/2016 11:51:17 AM > for how  long and do we have an address: I may ahve to send a letter [name],[name]  12/16/2016 1:11:18 PM > records have already been sent this appt should have  been cancelled                * * *                ---          * * *          Patient: [name] DOB: June 18, 1931 Provider: [name]  12/16/2016    ---    Note generated by eClinicalWorks EMR/PM Software ([email or web address])

## 2017-02-10 ENCOUNTER — Telehealth: Payer: Self-pay | Admitting: Internal Medicine

## 2017-02-10 NOTE — Telephone Encounter (Signed)
Dr Hector Brunswick is Patient's cardiologist in [place]. Patient requested referral to establish w/ [facility] cardiologist also. Faxed Request for office note from Dr Para March w/ hx dx for local referral. fax [phone].

## 2017-02-11 ENCOUNTER — Encounter: Payer: Self-pay | Admitting: Internal Medicine

## 2017-02-16 ENCOUNTER — Ambulatory Visit: Payer: Self-pay | Admitting: Adult Health

## 2017-02-23 NOTE — Progress Notes (Signed)
FOLLOW UP  Assessment and Plan:   Hypertension Well controlled with current medications  Monitor blood pressure at home; patient to call if consistently greater than 130/80 Continue DASH diet.   Reminder to go to the ER if any CP, SOB, nausea, dizziness, severe HA, changes vision/speech, left arm numbness and tingling and jaw pain.  Cholesterol Currently not on treatment with last LDL result obliterated by severely elevated triglycerides Recommended low cholesterol diet and exercise.  Check lipid panel.   Prediabetes Discussed disease and risks Discussed diet/exercise, weight management  A1C  CKD stage 3 Increase fluids, avoid NSAIDS, monitor sugars, will monitor BMP with GFR  Osteoarthritis of lumbar spine, unspecified spinal osteoarthritis complication status Has been several years since visit with ortho; progressive lower back pain without sciatica; reports increasing pain and reduced ROM significantly influencing ADLs; seen by specialist in [place]; needs local provider for evaluation and pain management -     Ambulatory referral to Orthopedics  Vision changes Has ongoing floaters/blurriness of L eye; questionable macular degeneration; s/p bilateral cataracts; requests referral to become established with local provider -     Ambulatory referral to Ophthalmology  Urinary tract infection without hematuria, site unspecified -     Urinalysis w microscopic + reflex cultur  Vitamin D deficiency Continue supplementation -     VITAMIN D 25 Hydroxy (Vit-D Deficiency, Fractures)  Gastroesophageal reflux disease, esophagitis presence not specified Well managed on current medications Discussed diet, avoiding triggers and other lifestyle changes -     Magnesium   Continue diet and meds as discussed. Further disposition pending results of labs. Discussed med's effects and SE's.   Over 30 minutes of exam, counseling, chart review, and critical decision making was performed.    Future Appointments  Date Time Provider [place]  04/20/2017  3:30 PM [name], MD [facility] None    ----------------------------------------------------------------------------------------------------------------------  HPI 83 y.o. male  presents for 3 month follow up on hypertension, cholesterol, prediabetes, vitamin D deficiency and stage 3 CKD. He is trying to establish care with local providers as he splits his time due to work between this area and [place]; requesting cardiology, ophthalmology and ortho referrals today. He has a reported history of paroxysmal A. Fib for which he was treated with amiodarone and xarelto; he reports these were discontinued along with ASA after he began experiencing frank urinary bleeding- he was also treated for UTI with augmentin back in August. He has not resumed medications since. Treated for rt facial pain  which is better on Gabapentin and Tegretol at last visit - he has further increased gabapentin on Dr. Idell Pickles recommendation and is reportedly doing fairly.   His blood pressure has been controlled at home, today their BP is BP: 134/80  He does not workout. He denies chest pain, shortness of breath, dizziness.   He is not on cholesterol medication and denies myalgias. His cholesterol is not at goal with  The cholesterol last visit was:   Lab Results  Component Value Date   CHOL 178 03/01/2015   HDL 47 03/01/2015   LDLCALC NOT CALC 03/01/2015   TRIG 584 (H) 03/01/2015   CHOLHDL 3.8 03/01/2015    He has been working on diet and exercise for prediabetes, and denies increased appetite, nausea, paresthesia of the feet, polydipsia, polyuria, visual disturbances and vomiting. Last A1C in the office was:  Lab Results  Component Value Date   HGBA1C 5.6 10/08/2016   The patient has been followed for stable late stage 3  CKD:  Lab Results  Component Value Date   GFRNONAA 35 (L) 11/17/2016   GFRNONAA 35 (L) 10/23/2016    GFRNONAA 36 (L) 10/16/2016     Current Medications:  Current Outpatient Medications on File Prior to Visit  Medication Sig  . ALPRAZolam (XANAX) 1 MG tablet Take 1 mg by mouth. Take 1/2 pill in the am and 1 pill qhs  . amiodarone (PACERONE) 200 MG tablet Take 200 mg by mouth daily.  [name] Kitchen gabapentin (NEURONTIN) 100 MG capsule TAKE ONE CAPSULE BY MOUTH THREE TIMES DAILY  . hydrochlorothiazide (MICROZIDE) 12.5 MG capsule Take 12.5 mg by mouth daily.  . Multiple Vitamins-Minerals (CENTRUM SILVER ADULT 50+ PO) Take by mouth.  [name] Kitchen omeprazole (PRILOSEC) 20 MG capsule Take 20 mg by mouth daily.  . predniSONE (DELTASONE) 20 MG tablet 1 tab 3 x day for 3 days, then 1 tab 2 x day for 3 days, then 1 tab 1 x day for 5 days  . amoxicillin-clavulanate (AUGMENTIN) 875-125 MG tablet Take 1 tablet by mouth 2 (two) times daily.  [name] Kitchen aspirin EC 81 MG tablet Take 81 mg by mouth daily.  . carbamazepine (TEGRETOL) 200 MG tablet Take 1 tablet 2 x/ day  . traMADol (ULTRAM) 50 MG tablet Take 1/2 to 1 tablet qid for pain prn, if not relieved by Tylenol. (Patient not taking: Reported on 02/24/2017)  . triamcinolone cream (KENALOG) 0.1 % Apply 1 application topically 2 (two) times daily. (Patient not taking: Reported on 02/24/2017)   No current facility-administered medications on file prior to visit.      Allergies:  Allergies  Allergen Reactions  . Ace Inhibitors Swelling    angioedema     Medical History:  Past Medical History:  Diagnosis Date  . Arthritis   . Cancer ([place])   . Hyperlipidemia   . Prostate cancer ([place])   . Prostate cancer [facility])    Family history- Reviewed and unchanged Social history- Reviewed and unchanged   Review of Systems:  Review of Systems  Constitutional: Negative for malaise/fatigue and weight loss.  HENT: Negative for hearing loss and tinnitus.   Eyes: Positive for blurred vision (Left eye) and photophobia (Left eye). Negative for double vision.  Respiratory: Negative for  cough, shortness of breath and wheezing.   Cardiovascular: Negative for chest pain, palpitations, orthopnea, claudication and leg swelling.  Gastrointestinal: Negative for abdominal pain, blood in stool, constipation, diarrhea, heartburn, melena, nausea and vomiting.  Genitourinary: Negative.  Negative for dysuria, flank pain, frequency, hematuria and urgency.  Musculoskeletal: Positive for back pain. Negative for joint pain and myalgias.  Skin: Negative for rash.  Neurological: Negative for dizziness, tingling, sensory change, weakness and headaches.  Endo/Heme/Allergies: Negative for polydipsia.  Psychiatric/Behavioral: Negative.   All other systems reviewed and are negative.   Physical Exam: BP 134/80   Pulse (!) 56   Temp (!) 97.5 F (36.4 C)   Ht 5\' 7"  (1.702 m)   Wt 176 lb (79.8 kg)   SpO2 96%   BMI 27.57 kg/m  Wt Readings from Last 3 Encounters:  02/24/17 176 lb (79.8 kg)  11/17/16 166 lb (75.3 kg)  11/03/16 164 lb (74.4 kg)   General Appearance: Well nourished, in no apparent distress. Eyes: PERRLA, EOMs, conjunctiva no swelling or erythema Sinuses: No Frontal/maxillary tenderness ENT/Mouth: Ext aud canals bilaterally partially obstructed by dry wax, TMs without erythema, bulging. No erythema, swelling, or exudate on post pharynx.  Tonsils not swollen or erythematous. Hearing normal.  Neck: Supple, thyroid  normal.  Respiratory: Respiratory effort normal, BS equal bilaterally without rales, rhonchi, wheezing or stridor.  Cardio: RRR with no MRGs. Brisk peripheral pulses without edema.  Abdomen: Soft, + BS.  Non tender, no guarding, rebound, hernias, masses. Lymphatics: Non tender without lymphadenopathy.  Musculoskeletal: Full ROM, 5/5 strength, Normal gait Skin: Warm, dry without rashes, lesions, ecchymosis.  Neuro: Cranial nerves intact. No cerebellar symptoms.  Psych: Awake and oriented X 3, normal affect, Insight and Judgment appropriate.    Izora Ribas,  NP 4:59 PM [facility]

## 2017-02-24 ENCOUNTER — Ambulatory Visit: Payer: Medicare Other | Admitting: Adult Health

## 2017-02-24 ENCOUNTER — Encounter: Payer: Self-pay | Admitting: Adult Health

## 2017-02-24 VITALS — BP 134/80 | HR 56 | Temp 97.5°F | Ht 67.0 in | Wt 176.0 lb

## 2017-02-24 DIAGNOSIS — H539 Unspecified visual disturbance: Secondary | ICD-10-CM | POA: Diagnosis not present

## 2017-02-24 DIAGNOSIS — E782 Mixed hyperlipidemia: Secondary | ICD-10-CM | POA: Diagnosis not present

## 2017-02-24 DIAGNOSIS — E559 Vitamin D deficiency, unspecified: Secondary | ICD-10-CM

## 2017-02-24 DIAGNOSIS — Z79899 Other long term (current) drug therapy: Secondary | ICD-10-CM | POA: Diagnosis not present

## 2017-02-24 DIAGNOSIS — N183 Chronic kidney disease, stage 3 unspecified: Secondary | ICD-10-CM

## 2017-02-24 DIAGNOSIS — I10 Essential (primary) hypertension: Secondary | ICD-10-CM | POA: Diagnosis not present

## 2017-02-24 DIAGNOSIS — N390 Urinary tract infection, site not specified: Secondary | ICD-10-CM | POA: Diagnosis not present

## 2017-02-24 DIAGNOSIS — M47816 Spondylosis without myelopathy or radiculopathy, lumbar region: Secondary | ICD-10-CM | POA: Diagnosis not present

## 2017-02-24 DIAGNOSIS — K219 Gastro-esophageal reflux disease without esophagitis: Secondary | ICD-10-CM | POA: Diagnosis not present

## 2017-02-24 DIAGNOSIS — R7303 Prediabetes: Secondary | ICD-10-CM | POA: Diagnosis not present

## 2017-02-24 DIAGNOSIS — L568 Other specified acute skin changes due to ultraviolet radiation: Secondary | ICD-10-CM

## 2017-02-24 NOTE — Patient Instructions (Addendum)
Restart aspirin 81 mg tomorrow unless I tell you otherwise - ask cardiology if ok to continue when you see them     Here is some information to help you keep your heart healthy: Move it! - Aim for 30 mins of activity every day. Take it slowly at first. Talk to [place] before starting any new exercise program.   Lose it.  -Body Mass Index (BMI) can indicate if you need to lose weight. A healthy range is 18.5-24.9. For a BMI calculator, go to [employer]  Waist Management -Excess abdominal fat is a risk factor for heart disease, diabetes, asthma, stroke and more. Ideal waist circumference is less than 35" for women and less than 40" for men.   Eat Right -focus on fruits, vegetables, whole grains, and meals you make yourself. Avoid foods with trans fat and high sugar/sodium content.   Snooze or Snore? - Loud snoring can be a sign of sleep apnea, a significant risk factor for high blood pressure, heart attach, stroke, and heart arrhythmias.  Kick the habit -Quit Smoking! Avoid second hand smoke. A single cigarette raises your blood pressure for 20 mins and increases the risk of heart attack and stroke for the next 24 hours.   Are Aspirin and Supplements right for you? -Add ENTERIC COATED low dose 81 mg Aspirin daily OR can do every other day if you have easy bruising to protect your heart and head. As well as to reduce risk of Colon Cancer by 20 %, Skin Cancer by 26 % , Melanoma by 46% and Pancreatic cancer by 60%  Say "No to Stress -There may be little you can do about problems that cause stress. However, techniques such as long walks, meditation, and exercise can help you manage it.   Start Now! - Make changes one at a time and set reasonable goals to increase your likelihood of success.

## 2017-02-25 ENCOUNTER — Other Ambulatory Visit: Payer: Self-pay | Admitting: Adult Health

## 2017-02-25 ENCOUNTER — Encounter: Payer: Self-pay | Admitting: Internal Medicine

## 2017-02-25 DIAGNOSIS — R748 Abnormal levels of other serum enzymes: Secondary | ICD-10-CM

## 2017-02-25 LAB — URINALYSIS W MICROSCOPIC + REFLEX CULTURE
Bacteria, UA: NONE SEEN /HPF
Bilirubin Urine: NEGATIVE
GLUCOSE, UA: NEGATIVE
HYALINE CAST: NONE SEEN /LPF
Hgb urine dipstick: NEGATIVE
Ketones, ur: NEGATIVE
Leukocyte Esterase: NEGATIVE
Nitrites, Initial: NEGATIVE
PH: 5.5 (ref 5.0–8.0)
RBC / HPF: NONE SEEN /HPF (ref 0–2)
Specific Gravity, Urine: 1.022 (ref 1.001–1.03)
Squamous Epithelial / LPF: NONE SEEN /HPF (ref <=5)
WBC, UA: NONE SEEN /HPF (ref 0–5)

## 2017-02-25 LAB — HEPATIC FUNCTION PANEL
AG Ratio: 1.8 (calc) (ref 1.0–2.5)
ALKALINE PHOSPHATASE (APISO): 199 U/L — AB (ref 40–115)
ALT: 13 U/L (ref 9–46)
AST: 18 U/L (ref 10–35)
Albumin: 4.2 g/dL (ref 3.6–5.1)
BILIRUBIN INDIRECT: 0.4 mg/dL (ref 0.2–1.2)
Bilirubin, Direct: 0.1 mg/dL (ref 0.0–0.2)
Globulin: 2.4 g/dL (calc) (ref 1.9–3.7)
TOTAL PROTEIN: 6.6 g/dL (ref 6.1–8.1)
Total Bilirubin: 0.5 mg/dL (ref 0.2–1.2)

## 2017-02-25 LAB — CBC WITH DIFFERENTIAL/PLATELET
Basophils Absolute: 32 {cells}/uL (ref 0–200)
Basophils Relative: 0.6 %
Eosinophils Absolute: 170 {cells}/uL (ref 15–500)
Eosinophils Relative: 3.2 %
HCT: 41 % (ref 38.5–50.0)
Hemoglobin: 14.3 g/dL (ref 13.2–17.1)
Lymphs Abs: 1765 {cells}/uL (ref 850–3900)
MCH: 33.5 pg — ABNORMAL HIGH (ref 27.0–33.0)
MCHC: 34.9 g/dL (ref 32.0–36.0)
MCV: 96 fL (ref 80.0–100.0)
MPV: 10.6 fL (ref 7.5–12.5)
Monocytes Relative: 9.5 %
Neutro Abs: 2830 {cells}/uL (ref 1500–7800)
Neutrophils Relative %: 53.4 %
Platelets: 196 Thousand/uL (ref 140–400)
RBC: 4.27 Million/uL (ref 4.20–5.80)
RDW: 13 % (ref 11.0–15.0)
Total Lymphocyte: 33.3 %
WBC mixed population: 504 {cells}/uL (ref 200–950)
WBC: 5.3 Thousand/uL (ref 3.8–10.8)

## 2017-02-25 LAB — BASIC METABOLIC PANEL WITH GFR
BUN / CREAT RATIO: 12 (calc) (ref 6–22)
BUN: 20 mg/dL (ref 7–25)
CHLORIDE: 104 mmol/L (ref 98–110)
CO2: 29 mmol/L (ref 20–32)
Calcium: 9.2 mg/dL (ref 8.6–10.3)
Creat: 1.63 mg/dL — ABNORMAL HIGH (ref 0.70–1.11)
GFR, Est African American: 44 mL/min/{1.73_m2} — ABNORMAL LOW (ref >=60)
GFR, Est Non African American: 38 mL/min/{1.73_m2} — ABNORMAL LOW (ref >=60)
GLUCOSE: 99 mg/dL (ref 65–99)
Potassium: 4.3 mmol/L (ref 3.5–5.3)
SODIUM: 139 mmol/L (ref 135–146)

## 2017-02-25 LAB — LIPID PANEL
Cholesterol: 257 mg/dL — ABNORMAL HIGH
HDL: 63 mg/dL
LDL Cholesterol (Calc): 149 mg/dL — ABNORMAL HIGH
Non-HDL Cholesterol (Calc): 194 mg/dL — ABNORMAL HIGH
Total CHOL/HDL Ratio: 4.1 (calc)
Triglycerides: 295 mg/dL — ABNORMAL HIGH

## 2017-02-25 LAB — VITAMIN D 25 HYDROXY (VIT D DEFICIENCY, FRACTURES): Vit D, 25-Hydroxy: 37 ng/mL (ref 30–100)

## 2017-02-25 LAB — HEMOGLOBIN A1C
Hgb A1c MFr Bld: 5.9 %{Hb} — ABNORMAL HIGH
Mean Plasma Glucose: 123 (calc)
eAG (mmol/L): 6.8 (calc)

## 2017-02-25 LAB — TSH: TSH: 3.66 m[IU]/L (ref 0.40–4.50)

## 2017-02-25 LAB — MAGNESIUM: Magnesium: 2.1 mg/dL (ref 1.5–2.5)

## 2017-02-25 LAB — NO CULTURE INDICATED

## 2017-02-26 ENCOUNTER — Ambulatory Visit (HOSPITAL_COMMUNITY)
Admission: RE | Admit: 2017-02-26 | Discharge: 2017-02-26 | Disposition: A | Discharge status: Home / Self Care | Payer: Medicare Other | Source: Ambulatory Visit | Attending: Adult Health | Admitting: Adult Health

## 2017-02-26 ENCOUNTER — Other Ambulatory Visit: Payer: Self-pay | Admitting: Adult Health

## 2017-02-26 DIAGNOSIS — M545 Low back pain: Secondary | ICD-10-CM | POA: Insufficient documentation

## 2017-02-26 DIAGNOSIS — Z8546 Personal history of malignant neoplasm of prostate: Secondary | ICD-10-CM | POA: Insufficient documentation

## 2017-02-26 DIAGNOSIS — G8929 Other chronic pain: Secondary | ICD-10-CM

## 2017-03-26 NOTE — Progress Notes (Addendum)
Cardiology Office Note   Date:  03/27/2017   ID:  Logan Brown, DOB [birth year], MRN [id number]  PCP:  [name], MD  Cardiologist:   Minus Breeding, MD Referring:  [name], MD  Chief Complaint  Patient presents with  . Shortness of Breath      History of Present Illness: Logan Brown is a 84 y.o. male who is referred by [name], MD for follow up of cardiomyopathy.  He reports that he was in the hospital he thinks last summer with dizziness at work at [employer].  I do not have all the records. I was able to review out outside records and he had a previous EF of 25%.  However, last year when it was checked it was 55%.  I suspect he was in atrial fibrillation as he is on amiodarone now.  He did not have a catheterization.  He was on Xarelto until recently when he had hematuria and this was stopped.  He has not had any further tachypalpitations.  He will occasionally get short of breath but he does relatively well.  He denies any chest pressure, neck or arm discomfort.  He does not notice any presyncope or syncope.  We will get short of breath only when he is doing some activity.   Past Medical History:  Diagnosis Date  . Arthritis   . Atrial fibrillation ([place])   . Cardiomyopathy ([place])   . Hyperlipidemia   . Prostate cancer [facility])     Past Surgical History:  Procedure Laterality Date  . CATARACT EXTRACTION    . EYE SURGERY Bilateral    IOL/CE on Lt in 1998 and Rt in 2011.     Current Outpatient Medications  Medication Sig Dispense Refill  . ALPRAZolam (XANAX) 1 MG tablet Take 1 mg by mouth. Take 1/2 pill in the am and 1 pill qhs    . amiodarone (PACERONE) 200 MG tablet Take 200 mg by mouth daily.    [name] Kitchen aspirin EC 81 MG tablet Take 81 mg by mouth daily.    [name] Kitchen gabapentin (NEURONTIN) 100 MG capsule TAKE ONE CAPSULE BY MOUTH THREE TIMES DAILY 270 capsule 0  . Multiple Vitamins-Minerals (CENTRUM SILVER ADULT 50+ PO) Take by mouth.    [name] Kitchen omeprazole  (PRILOSEC) 20 MG capsule Take 20 mg by mouth daily.     No current facility-administered medications for this visit.     Allergies:   Ace inhibitors    Social History:  The patient  reports that he quit smoking about 42 years ago. he has never used smokeless tobacco. He reports that he does not drink alcohol or use drugs.   Family History:  The patient's family history is not on file.   He does not know about the family history very much.    ROS:  Please see the history of present illness.   Otherwise, review of systems are positive for insomnia.   All other systems are reviewed and negative.    PHYSICAL EXAM: VS:  BP 136/70 (BP Location: Right Arm, Patient Position: Sitting, Cuff Size: Normal)   Pulse 60   Ht 5\' 7"  (1.702 m)   Wt 175 lb 3.2 oz (79.5 kg)   BMI 27.44 kg/m  , BMI Body mass index is 27.44 kg/m. GENERAL:  Well appearing, looks younger than stated age 25:  Pupils equal round and reactive, fundi not visualized, oral mucosa unremarkable NECK:  No jugular venous distention, waveform within normal limits, carotid upstroke brisk and  symmetric, no bruits, no thyromegaly LYMPHATICS:  No cervical, inguinal adenopathy LUNGS:  Clear to auscultation bilaterally BACK:  No CVA tenderness CHEST:  Unremarkable HEART:  PMI not displaced or sustained,S1 and S2 within normal limits, no S3, no S4, no clicks, no rubs, no murmurs ABD:  Flat, positive bowel sounds normal in frequency in pitch, no bruits, no rebound, no guarding, no midline pulsatile mass, no hepatomegaly, no splenomegaly EXT:  2 plus pulses throughout, no edema, no cyanosis no clubbing SKIN:  No rashes no nodules NEURO:  Cranial nerves II through XII grossly intact, motor grossly intact throughout PSYCH:  Cognitively intact, oriented to person place and time    EKG:  EKG is ordered today. The ekg ordered today demonstrates sinus rhythm, rate 60, left axis deviation, sinus arrhythmia, no acute ST-T wave  changes.   Recent Labs: 02/24/2017: ALT 13; BUN 20; Creat 1.63; Hemoglobin 14.3; Magnesium 2.1; Platelets 196; Potassium 4.3; Sodium 139; TSH 3.66    Lipid Panel    Component Value Date/Time   CHOL 257 (H) 02/24/2017 1721   TRIG 295 (H) 02/24/2017 1721   HDL 63 02/24/2017 1721   CHOLHDL 4.1 02/24/2017 1721   VLDL NOT CALC 03/01/2015 1742   LDLCALC NOT CALC 03/01/2015 1742     Lab Results  Component Value Date   TSH 3.66 02/24/2017   ALT 13 02/24/2017   AST 18 02/24/2017   ALKPHOS 66 03/01/2015   BILITOT 0.5 02/24/2017   PROT 6.6 02/24/2017   ALBUMIN 3.6 03/01/2015     Wt Readings from Last 3 Encounters:  03/27/17 175 lb 3.2 oz (79.5 kg)  02/24/17 176 lb (79.8 kg)  11/17/16 166 lb (75.3 kg)      Other studies Reviewed: Additional studies/ records that were reviewed today include: Outside records from [place]. Review of the above records demonstrates:  Please see elsewhere in the note.     ASSESSMENT AND PLAN:  CARDIOMYOPATHY: I think this was probably a rate related cardiomyopathy and is now improved.  He is doing quite well and no change in therapy is planned.  HTN: Blood pressure is well controlled and he will continue the meds as listed.  HYPERLIPIDEMIA:   His total cholesterol was 257 with an HDL of 63.  We can repeat this in the future when he comes back.  CAROTID STENOSIS:  He had mild plaque in July 2018.  He does not need follow up at this time.   ATRIAL FIB: I suspect he had atrial fib.  He has not had any symptomatic dysrhythmias since then.  I think he should stay on the amiodarone.  I did see a chest x-ray fairly recently that demonstrated no significant abnormalities.  DYSPNEA: I am going to check a BNP level.   Current medicines are reviewed at length with the patient today.  The patient does not have concerns regarding medicines.  The following changes have been made:  no change  Labs/ tests ordered today include:  No orders of the defined types  were placed in this encounter.    Disposition:   FU with me after he gets back from [place] later this year.      Signed, Minus Breeding, MD  03/27/2017 3:20 PM    [facility]

## 2017-03-27 ENCOUNTER — Ambulatory Visit: Payer: Medicare Other | Admitting: Cardiology

## 2017-03-27 ENCOUNTER — Encounter: Payer: Self-pay | Admitting: Cardiology

## 2017-03-27 VITALS — BP 136/70 | HR 60 | Ht 67.0 in | Wt 175.2 lb

## 2017-03-27 DIAGNOSIS — I4891 Unspecified atrial fibrillation: Secondary | ICD-10-CM | POA: Diagnosis not present

## 2017-03-27 DIAGNOSIS — I428 Other cardiomyopathies: Secondary | ICD-10-CM | POA: Diagnosis not present

## 2017-03-27 DIAGNOSIS — R0602 Shortness of breath: Secondary | ICD-10-CM | POA: Diagnosis not present

## 2017-03-27 DIAGNOSIS — I10 Essential (primary) hypertension: Secondary | ICD-10-CM | POA: Diagnosis not present

## 2017-03-27 NOTE — Patient Instructions (Addendum)
Medication Instructions:  Continue current medications  If you need a refill on your cardiac medications before your next appointment, please call your pharmacy.  Labwork: BNP HERE IN OUR OFFICE AT [facility]  Take the provided lab slips for you to take with you to the lab for you blood draw.   You will NOT need to fast   You may go to any [facility] lab that is convenient for you however, we do have a lab in our office that is able to assist you. You do NOT need an appointment for our lab. Once in our office lobby there is a podium to the right of the check-in desk where you are to sign-in and ring a doorbell to alert [place] you are here. Lab is open Monday-Friday from 8:00am to 4:00pm; and is closed for lunch from 12:45p-1:45pm   Testing/Procedures: None Ordered   Follow-Up: Your physician wants you to follow-up in: As Needed.    Thank you for choosing [facility] at [facility]!!

## 2017-03-28 LAB — BRAIN NATRIURETIC PEPTIDE: BNP: 139.3 pg/mL — AB (ref 0.0–100.0)

## 2017-03-30 ENCOUNTER — Telehealth: Payer: Self-pay | Admitting: Cardiology

## 2017-03-30 ENCOUNTER — Ambulatory Visit: Payer: Self-pay

## 2017-03-30 NOTE — Telephone Encounter (Signed)
Please call Ginger,something concerning his lab work.

## 2017-03-30 NOTE — Telephone Encounter (Signed)
Returned the call to the friend of the patient, per the patient request. She has been advised of the results and verbalized her understanding. Education has been given on reducing salt intake and looking at the sodium level on food packaging.   Notes recorded by Minus Breeding, MD on 03/29/2017 at 8:56 PM EST BNP is mildly elevated. No change in therapy. Make sure he eats low salt foods and watches his fluid intake. Call Mr. Pickar with the results and send results to [name], MD

## 2017-03-30 NOTE — Addendum Note (Signed)
Addended by: Jacqulynn Cadet on: 03/30/2017 12:50 PM   Modules accepted: Orders

## 2017-03-31 LAB — BRAIN NATRIURETIC PEPTIDE

## 2017-04-15 ENCOUNTER — Telehealth: Payer: Self-pay | Admitting: Cardiology

## 2017-04-15 NOTE — Telephone Encounter (Signed)
RN called to conf or deny CHF dx for this pt .   Please give her a call back.

## 2017-04-15 NOTE — Telephone Encounter (Signed)
Called number provided. Left message to send request form to our Medical Records at [phone].

## 2017-04-20 ENCOUNTER — Ambulatory Visit: Payer: Self-pay | Admitting: Internal Medicine

## 2017-04-28 ENCOUNTER — Ambulatory Visit: Payer: Self-pay | Admitting: Internal Medicine

## 2017-04-28 NOTE — Progress Notes (Signed)
NO SHOW

## 2017-05-05 NOTE — Progress Notes (Deleted)
[facility] Note  05/06/2017     CHIEF COMPLAINT Patient presents for No chief complaint on file.   HISTORY OF PRESENT ILLNESS: Logan Brown is a 84 y.o. male who presents to the clinic today for:     Referring physician: Katy Fitch, Darlina Guys, MD [address] [place], [place] [address]  HISTORICAL INFORMATION:   Selected notes from the MEDICAL RECORD NUMBER Referred by Dr. 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 [facility]) [BCVA OD: 20/100 OS: 20/40] Ocular Hx- pseudophakia OU, S/p injections OD, ERM OS, ARMD OU  PMH- former smoker, hx prostate ca, arthritis    CURRENT MEDICATIONS: No current outpatient medications on file. (Ophthalmic Drugs)   No current facility-administered medications for this visit.  (Ophthalmic Drugs)   Current Outpatient Medications (Other)  Medication Sig   ALPRAZolam (XANAX) 1 MG tablet Take 1 mg by mouth. Take 1/2 pill in the am and 1 pill qhs   amiodarone (PACERONE) 200 MG tablet Take 200 mg by mouth daily.   aspirin EC 81 MG tablet Take 81 mg by mouth daily.   gabapentin (NEURONTIN) 100 MG capsule TAKE ONE CAPSULE BY MOUTH THREE TIMES DAILY   Multiple Vitamins-Minerals (CENTRUM SILVER ADULT 50+ PO) Take by mouth.   omeprazole (PRILOSEC) 20 MG capsule Take 20 mg by mouth daily.   No current facility-administered medications for this visit.  (Other)      REVIEW OF SYSTEMS:    ALLERGIES Allergies  Allergen Reactions   Ace Inhibitors Swelling    angioedema    PAST MEDICAL HISTORY Past Medical History:  Diagnosis Date   Arthritis    Atrial fibrillation ([place])    Cardiomyopathy ([place])    Hyperlipidemia    Prostate cancer [facility])    Past Surgical History:  Procedure Laterality Date   CATARACT EXTRACTION     EYE SURGERY Bilateral    IOL/CE on Lt in 1998 and Rt in 2011.   PROSTATE SURGERY      FAMILY HISTORY No family history on file.  SOCIAL HISTORY Social History    Tobacco Use   Smoking status: Former Smoker    Last attempt to quit: 03/14/1975    Years since quitting: 42.1   Smokeless tobacco: Never Used  Substance Use Topics   Alcohol use: No    Alcohol/week: 0.0 oz    Comment: occ   Drug use: No         OPHTHALMIC EXAM:   Not recorded      IMAGING AND PROCEDURES  Imaging and Procedures for 05/05/17           ASSESSMENT/PLAN:    ICD-10-CM   1. Retinal edema H35.81 OCT, Retina - OU - Both Eyes    1.  2.  3.  Ophthalmic Meds Ordered this visit:  No orders of the defined types were placed in this encounter.      No Follow-up on file.  There are no Patient Instructions on file for this visit.   Explained the diagnoses, plan, and follow up with the patient and they expressed understanding.  Patient expressed understanding of the importance of proper follow up care.   This document serves as a record of services personally performed by Gardiner Sleeper, MD, PhD. It was created on their behalf by Catha Brow, [place], a certified ophthalmic assistant. The creation of this record is the provider's dictation and/or activities during the visit.  Electronically signed by: Ailene Ravel  Erline Levine  05/05/17 8:44 AM    Gardiner Sleeper, [name], Ph.D. Diseases & Surgery of the Retina and Vitreous [facility] 05/05/17     Abbreviations: M myopia (nearsighted); A astigmatism; H hyperopia (farsighted); P presbyopia; Mrx spectacle prescription;  CTL contact lenses; OD right eye; OS left eye; OU both eyes  XT exotropia; ET esotropia; PEK punctate epithelial keratitis; PEE punctate epithelial erosions; DES dry eye syndrome; MGD meibomian gland dysfunction; ATs artificial tears; PFAT's preservative free artificial tears; [place] nuclear sclerotic cataract; PSC posterior subcapsular cataract; ERM epi-retinal membrane; PVD posterior vitreous detachment; RD retinal detachment; DM diabetes mellitus; DR diabetic  retinopathy; NPDR non-proliferative diabetic retinopathy; PDR proliferative diabetic retinopathy; CSME clinically significant macular edema; DME diabetic macular edema; dbh dot blot hemorrhages; CWS cotton wool spot; POAG primary open angle glaucoma; C/D cup-to-disc ratio; HVF humphrey visual field; GVF goldmann visual field; OCT optical coherence tomography; IOP intraocular pressure; BRVO Branch retinal vein occlusion; CRVO central retinal vein occlusion; CRAO central retinal artery occlusion; BRAO branch retinal artery occlusion; RT retinal tear; SB scleral buckle; PPV pars plana vitrectomy; VH Vitreous hemorrhage; PRP panretinal laser photocoagulation; IVK intravitreal kenalog; VMT vitreomacular traction; MH Macular hole;  NVD neovascularization of the disc; NVE neovascularization elsewhere; AREDS age related eye disease study; ARMD age related macular degeneration; POAG primary open angle glaucoma; EBMD epithelial/anterior basement membrane dystrophy; ACIOL anterior chamber intraocular lens; IOL intraocular lens; PCIOL posterior chamber intraocular lens; Phaco/IOL phacoemulsification with intraocular lens placement; [place] photorefractive keratectomy; LASIK laser assisted in situ keratomileusis; HTN hypertension; DM diabetes mellitus; COPD chronic obstructive pulmonary disease

## 2017-05-06 ENCOUNTER — Encounter (INDEPENDENT_AMBULATORY_CARE_PROVIDER_SITE_OTHER): Payer: Self-pay | Admitting: Ophthalmology

## 2017-05-08 NOTE — Telephone Encounter (Signed)
New Message    1) Are you calling to confirm a diagnosis or obtain personal health information (Y/N)? yes  2) If so, what information is requested? See below  Please route to Medical Records or your medical records site representative   Needs patients ejection fraction - does patient have CHF ?  They need to know diagnosis

## 2017-05-10 ENCOUNTER — Encounter (HOSPITAL_COMMUNITY): Payer: Self-pay | Admitting: *Deleted

## 2017-05-10 ENCOUNTER — Emergency Department (HOSPITAL_COMMUNITY)
Admission: EM | Admit: 2017-05-10 | Discharge: 2017-05-10 | Disposition: A | Discharge status: Home / Self Care | Payer: Medicare Other | Attending: Emergency Medicine | Admitting: Emergency Medicine

## 2017-05-10 ENCOUNTER — Other Ambulatory Visit: Payer: Self-pay

## 2017-05-10 ENCOUNTER — Emergency Department (HOSPITAL_COMMUNITY): Payer: Medicare Other

## 2017-05-10 DIAGNOSIS — J181 Lobar pneumonia, unspecified organism: Secondary | ICD-10-CM

## 2017-05-10 DIAGNOSIS — Z79899 Other long term (current) drug therapy: Secondary | ICD-10-CM | POA: Insufficient documentation

## 2017-05-10 DIAGNOSIS — Z8546 Personal history of malignant neoplasm of prostate: Secondary | ICD-10-CM | POA: Insufficient documentation

## 2017-05-10 DIAGNOSIS — E782 Mixed hyperlipidemia: Secondary | ICD-10-CM | POA: Insufficient documentation

## 2017-05-10 DIAGNOSIS — N183 Chronic kidney disease, stage 3 (moderate): Secondary | ICD-10-CM | POA: Insufficient documentation

## 2017-05-10 DIAGNOSIS — I4891 Unspecified atrial fibrillation: Secondary | ICD-10-CM | POA: Insufficient documentation

## 2017-05-10 DIAGNOSIS — J189 Pneumonia, unspecified organism: Secondary | ICD-10-CM | POA: Diagnosis not present

## 2017-05-10 DIAGNOSIS — M5431 Sciatica, right side: Secondary | ICD-10-CM | POA: Insufficient documentation

## 2017-05-10 DIAGNOSIS — Z87891 Personal history of nicotine dependence: Secondary | ICD-10-CM | POA: Insufficient documentation

## 2017-05-10 DIAGNOSIS — Z7982 Long term (current) use of aspirin: Secondary | ICD-10-CM | POA: Diagnosis not present

## 2017-05-10 DIAGNOSIS — I129 Hypertensive chronic kidney disease with stage 1 through stage 4 chronic kidney disease, or unspecified chronic kidney disease: Secondary | ICD-10-CM | POA: Diagnosis not present

## 2017-05-10 DIAGNOSIS — I428 Other cardiomyopathies: Secondary | ICD-10-CM | POA: Insufficient documentation

## 2017-05-10 DIAGNOSIS — M545 Low back pain: Secondary | ICD-10-CM | POA: Diagnosis present

## 2017-05-10 LAB — CBC
HCT: 39.6 % (ref 39.0–52.0)
Hemoglobin: 13.1 g/dL (ref 13.0–17.0)
MCH: 32.8 pg (ref 26.0–34.0)
MCHC: 33.1 g/dL (ref 30.0–36.0)
MCV: 99.2 fL (ref 78.0–100.0)
PLATELETS: 211 10*3/uL (ref 150–400)
RBC: 3.99 MIL/uL — ABNORMAL LOW (ref 4.22–5.81)
RDW: 12.9 % (ref 11.5–15.5)
WBC: 7 10*3/uL (ref 4.0–10.5)

## 2017-05-10 LAB — BASIC METABOLIC PANEL
Anion gap: 10 (ref 5–15)
BUN: 14 mg/dL (ref 6–20)
CALCIUM: 9.2 mg/dL (ref 8.9–10.3)
CHLORIDE: 103 mmol/L (ref 101–111)
CO2: 25 mmol/L (ref 22–32)
CREATININE: 1.6 mg/dL — AB (ref 0.61–1.24)
GFR, EST AFRICAN AMERICAN: 44 mL/min — AB (ref >60)
GFR, EST NON AFRICAN AMERICAN: 38 mL/min — AB (ref >60)
Glucose, Bld: 105 mg/dL — ABNORMAL HIGH (ref 65–99)
Potassium: 4 mmol/L (ref 3.5–5.1)
SODIUM: 138 mmol/L (ref 135–145)

## 2017-05-10 LAB — I-STAT TROPONIN, ED: TROPONIN I, POC: 0.01 ng/mL (ref 0.00–0.08)

## 2017-05-10 MED ORDER — TRAMADOL HCL 50 MG PO TABS: 50.0000 mg | ORAL_TABLET | Freq: Four times a day (QID) | ORAL | 0 refills | Status: DC | PRN

## 2017-05-10 MED ORDER — DOXYCYCLINE HYCLATE 100 MG PO TABS
100.0000 mg | ORAL_TABLET | Freq: Once | ORAL | Status: AC
  Administered 2017-05-10: 100 mg via ORAL
  Filled 2017-05-10: qty 1

## 2017-05-10 MED ORDER — TRAMADOL HCL 50 MG PO TABS
50.0000 mg | ORAL_TABLET | Freq: Once | ORAL | Status: AC
  Administered 2017-05-10: 50 mg via ORAL
  Filled 2017-05-10: qty 1

## 2017-05-10 MED ORDER — PREDNISONE 20 MG PO TABS: 20.0000 mg | ORAL_TABLET | Freq: Two times a day (BID) | ORAL | 0 refills | Status: DC

## 2017-05-10 MED ORDER — PREDNISONE 20 MG PO TABS
60.0000 mg | ORAL_TABLET | Freq: Once | ORAL | Status: AC
  Administered 2017-05-10: 60 mg via ORAL
  Filled 2017-05-10: qty 3

## 2017-05-10 MED ORDER — DOXYCYCLINE HYCLATE 100 MG PO CAPS: 100.0000 mg | ORAL_CAPSULE | Freq: Two times a day (BID) | ORAL | 0 refills | Status: DC

## 2017-05-10 NOTE — ED Triage Notes (Addendum)
Pt has multiple complaints. States he has lower back pain that radiates down his legs. Has fatigue, sob, cough and decreased appetite. Had n/v x 1. Denies diarrhea.has cardiac hx

## 2017-05-10 NOTE — Discharge Instructions (Signed)
Follow-up with your primary care physician in 2-4 weeks for repeat x-ray to ensure that the changes noted today have resolved. Ultram for pain. 5-day course of steroids/prednisone for sciatica. Doxycycline for your pneumonia.  Take doxycycline and prednisone with food to avoid gastric upset

## 2017-05-10 NOTE — ED Provider Notes (Signed)
[facility] EMERGENCY DEPARTMENT Provider Note   CSN: [id number] Arrival date & time: 05/10/17  1526     History   Chief Complaint Chief Complaint  Patient presents with  . Back Pain  . Shortness of Breath    HPI Logan Brown is a 84 y.o. male.  Plane his right back and leg pain, persistent cough.  HPI she reports low back pain for the last several weeks.  Seen by orthopedics and had x-rays.  He states that they offered him injections.  He declined initially.  Although his pain is been getting worse he is reconsidering.  For the last 4-5 days he has had a cough and felt more short of breath and a poor appetite.  Nausea and vomiting once yesterday.  Eating today.  States he just "does not have a lot of energy.  No bowel or bladder symptoms.  No weakness of the extremities.  Past Medical History:  Diagnosis Date  . Arthritis   . Atrial fibrillation ([place])   . Cardiomyopathy ([place])   . Hyperlipidemia   . Prostate cancer [facility])     Patient Active Problem List   Diagnosis Date Noted  . Nonischemic cardiomyopathy ([place]) 03/27/2017  . SOB (shortness of breath) 03/27/2017  . Atrial fibrillation ([place]) 03/27/2017  . Osteoarthritis 02/24/2017  . UTI (urinary tract infection) 11/16/2016  . CKD (chronic kidney disease) stage 3, GFR 30-59 ml/min (HCC) 10/16/2016  . 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 HTN (hypertension) 03/01/2015  . Mixed hyperlipidemia 03/01/2015  . Prediabetes 03/01/2015  . Vitamin D deficiency 03/01/2015  . Medication management 03/01/2015    Past Surgical History:  Procedure Laterality Date  . CATARACT EXTRACTION    . EYE SURGERY Bilateral    IOL/CE on Lt in 1998 and Rt in 2011.  [name] Kitchen PROSTATE SURGERY         Home Medications    Prior to Admission medications   Medication Sig Start Date End Date Taking? Authorizing Provider  ALPRAZolam Duanne Moron) 1 MG tablet Take 1 mg by mouth. Take 1/2 pill in the am and 1 pill qhs     [provider]  amiodarone (PACERONE) 200 MG tablet Take 200 mg by mouth daily.    [provider]  aspirin EC 81 MG tablet Take 81 mg by mouth daily.    [provider]  doxycycline (VIBRAMYCIN) 100 MG capsule Take 1 capsule (100 mg total) by mouth 2 (two) times daily. 05/10/17   Tanna Furry, MD  gabapentin (NEURONTIN) 100 MG capsule TAKE ONE CAPSULE BY MOUTH THREE TIMES DAILY 01/30/16   Vicie Mutters, PA-C  Multiple Vitamins-Minerals (CENTRUM SILVER ADULT 50+ PO) Take by mouth.    [provider]  omeprazole (PRILOSEC) 20 MG capsule Take 20 mg by mouth daily.    [provider]  predniSONE (DELTASONE) 20 MG tablet Take 1 tablet (20 mg total) by mouth 2 (two) times daily with a meal. 05/10/17   Tanna Furry, MD  traMADol (ULTRAM) 50 MG tablet Take 1 tablet (50 mg total) by mouth every 6 (six) hours as needed. 05/10/17   Tanna Furry, MD    Family History History reviewed. No pertinent family history.  Social History Social History   Tobacco Use  . Smoking status: Former Smoker    Last attempt to quit: 03/14/1975    Years since quitting: 42.1  . Smokeless tobacco: Never Used  Substance Use Topics  . Alcohol use: No    Alcohol/week:  0.0 oz    Comment: occ  . Drug use: No     Allergies   Ace inhibitors   Review of Systems Review of Systems  Constitutional: Negative for appetite change, chills, diaphoresis, fatigue and fever.  HENT: Negative for mouth sores, sore throat and trouble swallowing.   Eyes: Negative for visual disturbance.  Respiratory: Positive for cough and shortness of breath. Negative for chest tightness and wheezing.   Cardiovascular: Negative for chest pain.  Gastrointestinal: Negative for abdominal distention, abdominal pain, diarrhea, nausea and vomiting.  Endocrine: Negative for polydipsia, polyphagia and polyuria.  Genitourinary: Negative for dysuria, frequency and hematuria.  Musculoskeletal: Positive for back pain  and gait problem.  Skin: Negative for color change, pallor and rash.  Neurological: Negative for dizziness, syncope, light-headedness and headaches.  Hematological: Does not bruise/bleed easily.  Psychiatric/Behavioral: Negative for behavioral problems and confusion.     Physical Exam Updated Vital Signs BP (!) 159/70   Pulse (!) 55   Temp 99.5 F (37.5 C) (Oral)   Resp 16   Ht 5\' 7"  (1.702 m)   Wt 78.5 kg (173 lb)   SpO2 97%   BMI 27.10 kg/m   Physical Exam  Constitutional: He is oriented to person, place, and time. He appears well-developed and well-nourished. No distress.  HENT:  Head: Normocephalic.  Eyes: Conjunctivae are normal. Pupils are equal, round, and reactive to light. No scleral icterus.  Neck: Normal range of motion. Neck supple. No thyromegaly present.  Cardiovascular: Normal rate and regular rhythm. Exam reveals no gallop and no friction rub.  No murmur heard. Pulmonary/Chest: Effort normal and breath sounds normal. No respiratory distress. He has no wheezes. He has no rales.  No increased work of breathing.  No abnormal breath sounds.  He is afebrile.  He is well oxygenated.  Abdominal: Soft. Bowel sounds are normal. He exhibits no distension. There is no tenderness. There is no rebound.  Musculoskeletal: Normal range of motion.  Normal strength to the bilateral lower extremities.  He has tenderness and pain in the right sciatic notch.  Normal reflexes.  Normal sensation.  No radicular symptoms or findings.  Neurological: He is alert and oriented to person, place, and time.  Skin: Skin is warm and dry. No rash noted.  Psychiatric: He has a normal mood and affect. His behavior is normal.     ED Treatments / Results  Labs (all labs ordered are listed, but only abnormal results are displayed) Labs Reviewed  BASIC METABOLIC PANEL - Abnormal; Notable for the following components:      Result Value   Glucose, Bld 105 (*)    Creatinine, Ser 1.60 (*)    GFR  calc non Af Amer 38 (*)    GFR calc Af Amer 44 (*)    All other components within normal limits  CBC - Abnormal; Notable for the following components:   RBC 3.99 (*)    All other components within normal limits  I-STAT TROPONIN, ED    EKG  EKG Interpretation None       Radiology Dg Chest 2 View  Result Date: 05/10/2017 CLINICAL DATA:  Patient with low back pain. Fatigue. Shortness of breath. Cough EXAM: CHEST  2 VIEW COMPARISON:  None. FINDINGS: Enlarged cardiac and mediastinal contours. Tortuosity thoracic aorta. Heterogeneous opacities right lower lung. Small nodular opacity left upper hemithorax. No pleural effusion or pneumothorax. Cholecystectomy clips. Thoracic spine degenerative changes. IMPRESSION: Minimal heterogeneous opacities right lower lung may represent atelectasis or infection.  Followup PA and lateral chest X-ray is recommended in 3-4 weeks following trial of antibiotic therapy to ensure resolution and exclude underlying malignancy. Small nodular opacity left upper hemithorax. If this persists on follow-up, chest CT should be considered for further evaluation. Electronically Signed   By: Lovey Newcomer [name]   On: 05/10/2017 16:09    Procedures Procedures (including critical care time)  Medications Ordered in ED Medications  doxycycline (VIBRA-TABS) tablet 100 mg (100 mg Oral Given 05/10/17 1836)  predniSONE (DELTASONE) tablet 60 mg (60 mg Oral Given 05/10/17 1840)  traMADol (ULTRAM) tablet 50 mg (50 mg Oral Given 05/10/17 1836)     Initial Impression / Assessment and Plan / ED Course  I have reviewed the triage vital signs and the nursing notes.  Pertinent labs & imaging results that were available during my care of the patient were reviewed by me and considered in my medical decision making (see chart for details).     Abnormality of the right base.  However possibly consistent with acquired pneumonia.  Plan doxycycline.  Prednisone burst and taper for his back pain.   Orthopedic follow-up.  Over-the-counter cough medication.  Tramadol for pain.  Final Clinical Impressions(s) / ED Diagnoses   Final diagnoses:  Sciatica of right side  Community acquired pneumonia of right lower lobe of lung [facility])    ED Discharge Orders        Ordered    predniSONE (DELTASONE) 20 MG tablet  2 times daily with meals     05/10/17 1835    doxycycline (VIBRAMYCIN) 100 MG capsule  2 times daily     05/10/17 1835    traMADol (ULTRAM) 50 MG tablet  Every 6 hours PRN     05/10/17 1835       Tanna Furry, MD 05/10/17 2344

## 2017-05-11 NOTE — Telephone Encounter (Signed)
Left message, patient has NICM and echo from 2017 his EF%=55%.

## 2017-05-18 ENCOUNTER — Ambulatory Visit (INDEPENDENT_AMBULATORY_CARE_PROVIDER_SITE_OTHER): Payer: Medicare Other | Admitting: Adult Health

## 2017-05-18 ENCOUNTER — Encounter: Payer: Self-pay | Admitting: Adult Health

## 2017-05-18 VITALS — BP 140/70 | HR 53 | Temp 97.3°F | Ht 67.0 in | Wt 175.0 lb

## 2017-05-18 DIAGNOSIS — I951 Orthostatic hypotension: Secondary | ICD-10-CM

## 2017-05-18 DIAGNOSIS — R6889 Other general symptoms and signs: Secondary | ICD-10-CM

## 2017-05-18 DIAGNOSIS — R112 Nausea with vomiting, unspecified: Secondary | ICD-10-CM | POA: Diagnosis not present

## 2017-05-18 LAB — POC INFLUENZA A&B (BINAX/QUICKVUE)
INFLUENZA A, POC: NEGATIVE
Influenza B, POC: NEGATIVE

## 2017-05-18 MED ORDER — ONDANSETRON HCL 4 MG PO TABS: 4.0000 mg | ORAL_TABLET | Freq: Every day | ORAL | 1 refills | Status: DC | PRN

## 2017-05-18 MED ORDER — PREDNISONE 20 MG PO TABS: 20.0000 mg | ORAL_TABLET | Freq: Two times a day (BID) | ORAL | 0 refills | Status: DC

## 2017-05-18 MED ORDER — PREDNISONE 20 MG PO TABS: ORAL_TABLET | ORAL | 0 refills | Status: DC

## 2017-05-18 NOTE — Progress Notes (Signed)
Assessment and Plan:  Girard Cooter was seen today for dizziness and vomiting.  Diagnoses and all orders for this visit:  Flu-like symptoms Will check influenza to r/o due to ?recent exposure - vague symptoms will check: -     CBC with Differential/Platelet -     BASIC METABOLIC PANEL WITH GFR -     Hepatic function panel -     Urinalysis w microscopic + reflex cultur -     POC Influenza A&B(BINAX/QUICKVUE) - negative -     Orthostatic vital signs -     predniSONE (DELTASONE) 20 MG tablet; Take 1 tablet (20 mg total) by mouth 2 (two) times daily with a meal.  Non-intractable vomiting with nausea, unspecified vomiting type -     ondansetron (ZOFRAN) 4 MG tablet; Take 1 tablet (4 mg total) by mouth daily as needed for nausea or vomiting.  Orthostatic hypotension EKG unremarkable other than bradycardia; poss dehydration + new BP med (calls back to report labetalol 100 mg BID); discussed possible ED for fluids and eval - patient would prefer to avoid this - strong ER precautions given should he not improve - advised to START CHECKING BP - Advised to hydrate well at home, monitor closely, may need reduce dose of new BP med - follow up with provider who initiated - take 1/2 dose until improved - patient will call to update [place] on this new med Monitor blood pressure at home; call if consistently over 130/80 or below 100/60 with dizziness Continue DASH diet.   STRONG ER PRECAUTIONS GIVEN - reminder to go to the ER if any CP, SOB, nausea, dizziness, severe HA, changes vision/speech, left arm numbness and tingling and jaw pain.   Further disposition pending results of labs. Discussed med's effects and SE's.   Over 30 minutes of exam, counseling, chart review, and critical decision making was performed.   Future Appointments  Date Time Provider [place]  06/01/2017  3:30 PM [name], MD [facility] None     ------------------------------------------------------------------------------------------------------------------   HPI BP 140/70   Pulse (!) 53   Temp (!) 97.3 F (36.3 C)   Ht 5\' 7"  (1.702 m)   Wt 175 lb (79.4 kg)   SpO2 97%   BMI 27.41 kg/m   84 y.o.male presents for nausea/emesis that began this morning; he endorses some lightheadedness today in office, reports he woke up feeling "woozy" - reports he lay back down an hour after getting up this morning, and reports he became nauseous and had 2 episodes of emesis. He reports he had not eaten other than a few pieces of toast. Currently he reports "I just feel a little dizzy." He reports he has been a bit constipated recently, otherwise denies abdominal pain, diarrhea, cp, dyspnea, headache, myalgias. Reports his lady friend has had "the flu" and has not been feeling well last week.   Recently seen in ER on 3/3 - CRX showed Minimal heterogeneous opacities right lower lung- was discharged on prednisone taper and doxycycline - he reports he completed this and was feeling much better.   He also endorses new BP med in the past week, cannot recall med, does not check BPs at home -  He was driven by a friend today.   Orthostats - supine 120/70, P 45, sitting 110/62, P 49, standing 100/50, P 50    Past Medical History:  Diagnosis Date  . Arthritis   . Atrial fibrillation ([place])   . Cardiomyopathy ([place])   . Hyperlipidemia   .  Prostate cancer ([place])      Allergies  Allergen Reactions  . Ace Inhibitors Swelling    angioedema    Current Outpatient Medications on File Prior to Visit  Medication Sig  . ALPRAZolam (XANAX) 1 MG tablet Take 1 mg by mouth. Take 1/2 pill in the am and 1 pill qhs  . amiodarone (PACERONE) 200 MG tablet Take 200 mg by mouth daily.  [name] Kitchen aspirin EC 81 MG tablet Take 81 mg by mouth daily.  [name] Kitchen doxycycline (VIBRAMYCIN) 100 MG capsule Take 1 capsule (100 mg total) by mouth 2 (two) times daily.  [name] Kitchen gabapentin  (NEURONTIN) 100 MG capsule TAKE ONE CAPSULE BY MOUTH THREE TIMES DAILY (Patient taking differently: TAKE 200mg  CAPSULE BY MOUTH THREE TIMES DAILY)  . Multiple Vitamins-Minerals (CENTRUM SILVER ADULT 50+ PO) Take by mouth.  [name] Kitchen omeprazole (PRILOSEC) 20 MG capsule Take 20 mg by mouth daily.  . traMADol (ULTRAM) 50 MG tablet Take 1 tablet (50 mg total) by mouth every 6 (six) hours as needed.   No current facility-administered medications on file prior to visit.     ROS: Review of Systems  Constitutional: Negative for chills, diaphoresis, fever and malaise/fatigue.  HENT: Negative for congestion, ear discharge, ear pain, hearing loss, sinus pain, sore throat and tinnitus.   Eyes: Negative for blurred vision, pain, discharge and redness.  Respiratory: Negative for cough, hemoptysis, sputum production, shortness of breath, wheezing and stridor.   Cardiovascular: Negative for chest pain, palpitations and orthopnea.  Gastrointestinal: Positive for nausea and vomiting. Negative for abdominal pain and diarrhea.  Genitourinary: Negative.   Musculoskeletal: Negative for falls, joint pain and myalgias.  Skin: Negative for rash.  Neurological: Negative for dizziness, sensory change, weakness and headaches.       "woozy"   Endo/Heme/Allergies: Negative for environmental allergies.  Psychiatric/Behavioral: Negative.   All other systems reviewed and are negative.   Physical Exam:  BP 140/70   Pulse (!) 53   Temp (!) 97.3 F (36.3 C)   Ht 5\' 7"  (1.702 m)   Wt 175 lb (79.4 kg)   SpO2 97%   BMI 27.41 kg/m   General Appearance: Well nourished, appears to be feeling unwell though in no apparent acute distress. Eyes: PERRLA, EOMs, conjunctiva no swelling or erythema Sinuses: No Frontal/maxillary tenderness ENT/Mouth: Ext aud canals clear, TMs without erythema, bulging. No erythema, swelling, or exudate on post pharynx.  Tonsils not swollen or erythematous. Hearing normal.  Neck: Supple, thyroid  normal.  Respiratory: Respiratory effort normal, BS equal bilaterally without rales, rhonchi, wheezing or stridor.  Cardio: RRR with no audible MRGs. 1+ symmetrical peripheral pulses without edema.  Abdomen: Soft, + BS.  Non tender, no guarding, rebound, hernias, masses. Lymphatics: Non tender without lymphadenopathy.  Musculoskeletal: Symmetrical strength, normal gait.  Skin: Warm, dry without rashes, lesions, ecchymosis.  Neuro: Cranial nerves intact. Normal muscle tone, no cerebellar symptoms. Sensation intact.  Psych: Awake and oriented X 3, normal affect, Insight and Judgment appropriate.    Izora Ribas, NP 3:00 PM [facility]

## 2017-05-18 NOTE — Patient Instructions (Addendum)
You are quite dehydrated today; very important for you to hydrate well, please drink plenty of fluids at home, drink gatorade if not eating -    Go to the ER if any chest pain, shortness of breath, nausea, dizziness, severe HA, changes vision/speech   Viral Illness, Adult Viruses are tiny germs that can get into a person's body and cause illness. There are many different types of viruses, and they cause many types of illness. Viral illnesses can range from mild to severe. They can affect various parts of the body. Common illnesses that are caused by a virus include colds and the flu. Viral illnesses also include serious conditions such as HIV/AIDS (human immunodeficiency virus/acquired immunodeficiency syndrome). A few viruses have been linked to certain cancers. What are the causes? Many types of viruses can cause illness. Viruses invade cells in your body, multiply, and cause the infected cells to malfunction or die. When the cell dies, it releases more of the virus. When this happens, you develop symptoms of the illness, and the virus continues to spread to other cells. If the virus takes over the function of the cell, it can cause the cell to divide and grow out of control, as is the case when a virus causes cancer. Different viruses get into the body in different ways. You can get a virus by:  Swallowing food or water that is contaminated with the virus.  Breathing in droplets that have been coughed or sneezed into the air by an infected person.  Touching a surface that has been contaminated with the virus and then touching your eyes, nose, or mouth.  Being bitten by an insect or animal that carries the virus.  Having sexual contact with a person who is infected with the virus.  Being exposed to blood or fluids that contain the virus, either through an open cut or during a transfusion.  If a virus enters your body, your body's defense system (immune system) will try to fight the virus.  You may be at higher risk for a viral illness if your immune system is weak. What are the signs or symptoms? Symptoms vary depending on the type of virus and the location of the cells that it invades. Common symptoms of the main types of viral illnesses include: Cold and flu viruses  Fever.  Headache.  Sore throat.  Muscle aches.  Nasal congestion.  Cough. Digestive system (gastrointestinal) viruses  Fever.  Abdominal pain.  Nausea.  Diarrhea. Liver viruses (hepatitis)  Loss of appetite.  Tiredness.  Yellowing of the skin (jaundice). Brain and spinal cord viruses  Fever.  Headache.  Stiff neck.  Nausea and vomiting.  Confusion or sleepiness. Skin viruses  Warts.  Itching.  Rash. Sexually transmitted viruses  Discharge.  Swelling.  Redness.  Rash. How is this treated? Viruses can be difficult to treat because they live within cells. Antibiotic medicines do not treat viruses because these drugs do not get inside cells. Treatment for a viral illness may include:  Resting and drinking plenty of fluids.  Medicines to relieve symptoms. These can include over-the-counter medicine for pain and fever, medicines for cough or congestion, and medicines to relieve diarrhea.  Antiviral medicines. These drugs are available only for certain types of viruses. They may help reduce flu symptoms if taken early. There are also many antiviral medicines for hepatitis and HIV/AIDS.  Some viral illnesses can be prevented with vaccinations. A common example is the flu shot. Follow these instructions at home: Medicines  Take over-the-counter and prescription medicines only as told by your health care provider.  If you were prescribed an antiviral medicine, take it as told by your health care provider. Do not stop taking the medicine even if you start to feel better.  Be aware of when antibiotics are needed and when they are not needed. Antibiotics do not treat  viruses. If your health care provider thinks that you may have a bacterial infection as well as a viral infection, you may get an antibiotic. ? Do not ask for an antibiotic prescription if you have been diagnosed with a viral illness. That will not make your illness go away faster. ? Frequently taking antibiotics when they are not needed can lead to antibiotic resistance. When this develops, the medicine no longer works against the bacteria that it normally fights. General instructions  Drink enough fluids to keep your urine clear or pale yellow.  Rest as much as possible.  Return to your normal activities as told by your health care provider. Ask your health care provider what activities are safe for you.  Keep all follow-up visits as told by your health care provider. This is important. How is this prevented? Take these actions to reduce your risk of viral infection:  Eat a healthy diet and get enough rest.  Wash your hands often with soap and water. This is especially important when you are in public places. If soap and water are not available, use hand sanitizer.  Avoid close contact with friends and family who have a viral illness.  If you travel to areas where viral gastrointestinal infection is common, avoid drinking water or eating raw food.  Keep your immunizations up to date. Get a flu shot every year as told by your health care provider.  Do not share toothbrushes, nail clippers, razors, or needles with other people.  Always practice safe sex.  Contact a health care provider if:  You have symptoms of a viral illness that do not go away.  Your symptoms come back after going away.  Your symptoms get worse. Get help right away if:  You have trouble breathing.  You have a severe headache or a stiff neck.  You have severe vomiting or abdominal pain. This information is not intended to replace advice given to you by your health care provider. Make sure you discuss any  questions you have with your health care provider. Document Released: 07/06/2015 Document Revised: 08/08/2015 Document Reviewed: 07/06/2015 Elsevier Interactive Patient Education  [employer].

## 2017-05-19 LAB — URINALYSIS W MICROSCOPIC + REFLEX CULTURE
BACTERIA UA: NONE SEEN /HPF
Bilirubin Urine: NEGATIVE
GLUCOSE, UA: NEGATIVE
HGB URINE DIPSTICK: NEGATIVE
KETONES UR: NEGATIVE
LEUKOCYTE ESTERASE: NEGATIVE
Nitrites, Initial: NEGATIVE
PH: 5.5 (ref 5.0–8.0)
RBC / HPF: NONE SEEN /HPF (ref 0–2)
Specific Gravity, Urine: 1.033 (ref 1.001–1.03)
Squamous Epithelial / LPF: NONE SEEN /HPF (ref <=5)

## 2017-05-19 LAB — BASIC METABOLIC PANEL WITH GFR
BUN/Creatinine Ratio: 21 (calc) (ref 6–22)
BUN: 35 mg/dL — AB (ref 7–25)
CALCIUM: 9 mg/dL (ref 8.6–10.3)
CO2: 29 mmol/L (ref 20–32)
CREATININE: 1.69 mg/dL — AB (ref 0.70–1.11)
Chloride: 100 mmol/L (ref 98–110)
GFR, EST AFRICAN AMERICAN: 43 mL/min/{1.73_m2} — AB (ref >=60)
GFR, EST NON AFRICAN AMERICAN: 37 mL/min/{1.73_m2} — AB (ref >=60)
Glucose, Bld: 110 mg/dL — ABNORMAL HIGH (ref 65–99)
POTASSIUM: 4.4 mmol/L (ref 3.5–5.3)
Sodium: 136 mmol/L (ref 135–146)

## 2017-05-19 LAB — CBC WITH DIFFERENTIAL/PLATELET
BASOS ABS: 20 {cells}/uL (ref 0–200)
Basophils Relative: 0.2 %
EOS PCT: 0.5 %
Eosinophils Absolute: 51 cells/uL (ref 15–500)
HCT: 42 % (ref 38.5–50.0)
Hemoglobin: 14.3 g/dL (ref 13.2–17.1)
Lymphs Abs: 1364 cells/uL (ref 850–3900)
MCH: 32.4 pg (ref 27.0–33.0)
MCHC: 34 g/dL (ref 32.0–36.0)
MCV: 95 fL (ref 80.0–100.0)
MONOS PCT: 8.5 %
MPV: 9.8 fL (ref 7.5–12.5)
NEUTROS ABS: 7807 {cells}/uL — AB (ref 1500–7800)
Neutrophils Relative %: 77.3 %
PLATELETS: 277 10*3/uL (ref 140–400)
RBC: 4.42 10*6/uL (ref 4.20–5.80)
RDW: 12.4 % (ref 11.0–15.0)
TOTAL LYMPHOCYTE: 13.5 %
WBC mixed population: 859 cells/uL (ref 200–950)
WBC: 10.1 10*3/uL (ref 3.8–10.8)

## 2017-05-19 LAB — HEPATIC FUNCTION PANEL
AG Ratio: 1.6 (calc) (ref 1.0–2.5)
ALBUMIN MSPROF: 3.8 g/dL (ref 3.6–5.1)
ALT: 31 U/L (ref 9–46)
AST: 22 U/L (ref 10–35)
Alkaline phosphatase (APISO): 289 U/L — ABNORMAL HIGH (ref 40–115)
BILIRUBIN DIRECT: 0.2 mg/dL (ref 0.0–0.2)
BILIRUBIN TOTAL: 0.7 mg/dL (ref 0.2–1.2)
GLOBULIN: 2.4 g/dL (ref 1.9–3.7)
Indirect Bilirubin: 0.5 mg/dL (calc) (ref 0.2–1.2)
Total Protein: 6.2 g/dL (ref 6.1–8.1)

## 2017-05-19 LAB — NO CULTURE INDICATED

## 2017-05-31 NOTE — Progress Notes (Signed)
This very nice 84 y.o.  DWM presents for 3 month follow up with HTN, HLD, Pre-Diabetes and Vitamin D Deficiency. Patient has remote hx/o Prostate circa 2000 treated by Surgery & Radiation. Patient's GERD is controlled on his Prilosec. Patient resides permanently in [place] and "snowbirds" to [place] to visit his Girlfriend. Next week he returns to [place] to return in August.      Today he's also c/o chronic low midline back pain and Rt thigh pains. He's been seen in the ER a couple of times for this pain. Lumbar X-rays have shown DJD & DDD changes . CXR don 03.03.2019 in the ER was equivocal & recc f/u CXR in 3-4 weeks.      Patient is treated for HTN (2005) and hx/o pAfib (2016) . Patient had been on Xarelto til d/c'd for Hematuria (still on LD bASA 81 mg). & BP has been controlled at home. Patient had recent evaluation by Dr Percival Spanish in Feb and recommended no changes in his meds. Today's BP is elevate at 150/80. Patient has had no complaints of any cardiac type chest pain, palpitations, dyspnea / orthopnea / PND, dizziness, claudication, or dependent edema.     Hyperlipidemia is not controlled with diet & and he is reticent to taking meds for Chol. meds.  Last Lipids were worse.  Lab Results  Component Value Date   CHOL 257 (H) 02/24/2017   HDL 63 02/24/2017   LDLCALC 149 (H) 02/24/2017   TRIG 295 (H) 02/24/2017   CHOLHDL 4.1 02/24/2017      Also, the patient has history of  PreDiabetes and has had no symptoms of reactive hypoglycemia, diabetic polys, paresthesias or visual blurring.   A1c was 6.3% in Dec 2016. Last A1c was not at goal: Lab Results  Component Value Date   HGBA1C 5.9 (H) 02/24/2017      Further, the patient also has history of Vitamin D Deficiency and does not supplemment Vit D as recommended. Last vitamin D was not at goal: Lab Results  Component Value Date   VD25OH 37 02/24/2017   Current Outpatient Medications on File Prior to Visit  Medication  Sig  . ALPRAZolam (XANAX) 1 MG tablet Take 1 mg by mouth. Take 1/2 pill in the am and 1 pill qhs  . amiodarone (PACERONE) 200 MG tablet Take 200 mg by mouth daily.  [name] Kitchen aspirin EC 81 MG tablet Take 81 mg by mouth daily.  [name] Kitchen gabapentin (NEURONTIN) 100 MG capsule TAKE ONE CAPSULE BY MOUTH THREE TIMES DAILY (Patient taking differently: TAKE 200mg  CAPSULE BY MOUTH THREE TIMES DAILY)  . labetalol (NORMODYNE) 100 MG tablet Take 100 mg by mouth 2 (two) times daily. Take 50mg  in the morning and 50mg  in the evening  . Multiple Vitamins-Minerals (CENTRUM SILVER ADULT 50+ PO) Take by mouth.  [name] Kitchen omeprazole (PRILOSEC) 20 MG capsule Take 20 mg by mouth daily.  . ondansetron (ZOFRAN) 4 MG tablet Take 1 tablet (4 mg total) by mouth daily as needed for nausea or vomiting.  . traMADol (ULTRAM) 50 MG tablet Take 1 tablet (50 mg total) by mouth every 6 (six) hours as needed.   No current facility-administered medications on file prior to visit.    Allergies  Allergen Reactions  . Ace Inhibitors Swelling    angioedema   PMHx:   Past Medical History:  Diagnosis Date  . Arthritis   . Atrial fibrillation ([place])   . Cardiomyopathy ([place])   . Hyperlipidemia   .  Prostate cancer ([place])    Immunization History  Administered Date(s) Administered  . Influenza, High Dose Seasonal PF 02/19/2015, 01/08/2017   Past Surgical History:  Procedure Laterality Date  . CATARACT EXTRACTION    . EYE SURGERY Bilateral    IOL/CE on Lt in 1998 and Rt in 2011.  [name] Kitchen PROSTATE SURGERY     FHx:    Reviewed / unchanged  SHx:    Reviewed / unchanged  Systems Review:  Constitutional: Denies fever, chills, wt changes, headaches, insomnia, fatigue, night sweats, change in appetite. Eyes: Denies redness, blurred vision, diplopia, discharge, itchy, watery eyes.  ENT: Denies discharge, congestion, post nasal drip, epistaxis, sore throat, earache, hearing loss, dental pain, tinnitus, vertigo, sinus pain, snoring.  CV: Denies chest pain,  palpitations, irregular heartbeat, syncope, dyspnea, diaphoresis, orthopnea, PND, claudication or edema. Respiratory: denies cough, dyspnea, DOE, pleurisy, hoarseness, laryngitis, wheezing.  Gastrointestinal: Denies dysphagia, odynophagia, heartburn, reflux, water brash, abdominal pain or cramps, nausea, vomiting, bloating, diarrhea, constipation, hematemesis, melena, hematochezia  or hemorrhoids. Genitourinary: Denies dysuria, frequency, urgency, nocturia, hesitancy, discharge, hematuria or flank pain. Musculoskeletal: Denies arthralgias, myalgias, stiffness, jt. swelling, pain, limping or strain/sprain.  Skin: Denies pruritus, rash, hives, warts, acne, eczema or change in skin lesion(s). Neuro: No weakness, tremor, incoordination, spasms, paresthesia or pain. Psychiatric: Denies confusion, memory loss or sensory loss. Endo: Denies change in weight, skin or hair change.  Heme/Lymph: No excessive bleeding, bruising or enlarged lymph nodes.  Physical Exam  BP (!) 152/80   Pulse 60   Temp 97.7 F (36.5 C)   Resp 16   Ht 5\' 7"  (1.702 m)   Wt 173 lb 6.4 oz (78.7 kg)   BMI 27.16 kg/m   Appears  well nourished, well groomed  and in no distress.  Eyes: PERRLA, EOMs, conjunctiva no swelling or erythema. Sinuses: No frontal/maxillary tenderness ENT/Mouth: EAC's clear, TM's nl w/o erythema, bulging. Nares clear w/o erythema, swelling, exudates. Oropharynx clear without erythema or exudates. Oral hygiene is good. Tongue normal, non obstructing. Hearing intact.  Neck: Supple. Thyroid not palpable. Car 2+/2+ without bruits, nodes or JVD. Chest: Respirations nl with BS clear & equal w/o rales, rhonchi, wheezing or stridor.  Cor: Heart sounds normal w/ regular rate and rhythm without sig. murmurs, gallops, clicks or rubs. Peripheral pulses normal and equal  without edema.  Abdomen: Soft & bowel sounds normal. Non-tender w/o guarding, rebound, hernias, masses or organomegaly.  Lymphatics:  Unremarkable.  Musculoskeletal: Full ROM all peripheral extremities, joint stability, 5/5 strength and normal gait.  Skin: Warm, dry without exposed rashes, lesions or ecchymosis apparent.  Neuro: Cranial nerves intact, reflexes equal bilaterally. Sensory-motor testing grossly intact. Tendon reflexes grossly intact.  Pysch: Alert & oriented x 3.  Insight and judgement nl & appropriate. No ideations.  Assessment and Plan:  1. Essential hypertension  - Continue medication, monitor blood pressure at home.  - Continue DASH diet. Reminder to go to the ER if any CP,  SOB, nausea, dizziness, severe HA, changes vision/speech.  - CBC with Differential/Platelet - BASIC METABOLIC PANEL WITH GFR - Magnesium - TSH  2. Mixed hyperlipidemia  - Continue diet/meds, exercise,& lifestyle modifications.  - Continue monitor periodic cholesterol/liver & renal functions   - Hepatic function panel - Lipid panel - TSH  3. Abnormal glucose  - Continue diet, exercise, lifestyle modifications.  - Monitor appropriate labs.  - Hemoglobin A1c - Insulin, random  4. Vitamin D deficiency  - Continue supplementation.  - VITAMIN D 25 Hydroxyl  5.  Prediabetes  - Hemoglobin A1c - Insulin, random  6. CKD (chronic kidney disease) stage 3, GFR 30-59 ml/min (HCC)  - BASIC METABOLIC PANEL WITH GFR  7. Paroxysmal atrial fibrillation (HCC)  - TSH  8. Gastroesophageal reflux disease, esophagitis presence not specified  - CBC with Differential/Platelet  9. Medication management  - CBC with Differential/Platelet - BASIC METABOLIC PANEL WITH GFR - Hepatic function panel - Magnesium - Lipid panel - TSH - Hemoglobin A1c - Insulin, random - VITAMIN D 25 Hydroxyl  10. Chronic midline low back pain with right-sided sciatica  - gabapentin (NEURONTIN) 300 MG capsule; Take 1 capsule 3 x / day for back & leg pain  Dispense: 270 capsule; Refill: 1      Patient was advised to have a f/u CXR in 1 week.  Discussed  regular exercise, BP monitoring, weight control to achieve/maintain BMI less than 25 and discussed med and SE's. Recommended labs to assess and monitor clinical status with further disposition pending results of labs. Over 30 minutes of exam, counseling, chart review was performed.

## 2017-05-31 NOTE — Patient Instructions (Signed)

## 2017-06-01 ENCOUNTER — Encounter: Payer: Self-pay | Admitting: Internal Medicine

## 2017-06-01 ENCOUNTER — Ambulatory Visit: Payer: Medicare Other | Admitting: Internal Medicine

## 2017-06-01 VITALS — BP 152/80 | HR 60 | Temp 97.7°F | Resp 16 | Ht 67.0 in | Wt 173.4 lb

## 2017-06-01 DIAGNOSIS — M5441 Lumbago with sciatica, right side: Secondary | ICD-10-CM | POA: Diagnosis not present

## 2017-06-01 DIAGNOSIS — N183 Chronic kidney disease, stage 3 unspecified: Secondary | ICD-10-CM

## 2017-06-01 DIAGNOSIS — E782 Mixed hyperlipidemia: Secondary | ICD-10-CM

## 2017-06-01 DIAGNOSIS — E559 Vitamin D deficiency, unspecified: Secondary | ICD-10-CM | POA: Diagnosis not present

## 2017-06-01 DIAGNOSIS — R7309 Other abnormal glucose: Secondary | ICD-10-CM

## 2017-06-01 DIAGNOSIS — I10 Essential (primary) hypertension: Secondary | ICD-10-CM

## 2017-06-01 DIAGNOSIS — I480 Paroxysmal atrial fibrillation: Secondary | ICD-10-CM | POA: Diagnosis not present

## 2017-06-01 DIAGNOSIS — R7303 Prediabetes: Secondary | ICD-10-CM | POA: Diagnosis not present

## 2017-06-01 DIAGNOSIS — Z79899 Other long term (current) drug therapy: Secondary | ICD-10-CM | POA: Diagnosis not present

## 2017-06-01 DIAGNOSIS — G8929 Other chronic pain: Secondary | ICD-10-CM

## 2017-06-01 DIAGNOSIS — R9389 Abnormal findings on diagnostic imaging of other specified body structures: Secondary | ICD-10-CM

## 2017-06-01 DIAGNOSIS — K219 Gastro-esophageal reflux disease without esophagitis: Secondary | ICD-10-CM

## 2017-06-01 MED ORDER — GABAPENTIN 300 MG PO CAPS: ORAL_CAPSULE | ORAL | 1 refills | Status: DC

## 2017-06-02 LAB — HEPATIC FUNCTION PANEL
AG RATIO: 1.6 (calc) (ref 1.0–2.5)
ALT: 15 U/L (ref 9–46)
AST: 21 U/L (ref 10–35)
Albumin: 3.8 g/dL (ref 3.6–5.1)
Alkaline phosphatase (APISO): 365 U/L — ABNORMAL HIGH (ref 40–115)
BILIRUBIN DIRECT: 0.1 mg/dL (ref 0.0–0.2)
BILIRUBIN INDIRECT: 0.6 mg/dL (ref 0.2–1.2)
GLOBULIN: 2.4 g/dL (ref 1.9–3.7)
TOTAL PROTEIN: 6.2 g/dL (ref 6.1–8.1)
Total Bilirubin: 0.7 mg/dL (ref 0.2–1.2)

## 2017-06-02 LAB — CBC WITH DIFFERENTIAL/PLATELET
BASOS ABS: 19 {cells}/uL (ref 0–200)
BASOS PCT: 0.3 %
EOS PCT: 1.1 %
Eosinophils Absolute: 68 cells/uL (ref 15–500)
HCT: 37.9 % — ABNORMAL LOW (ref 38.5–50.0)
Hemoglobin: 13 g/dL — ABNORMAL LOW (ref 13.2–17.1)
Lymphs Abs: 1488 cells/uL (ref 850–3900)
MCH: 32.7 pg (ref 27.0–33.0)
MCHC: 34.3 g/dL (ref 32.0–36.0)
MCV: 95.2 fL (ref 80.0–100.0)
MONOS PCT: 9 %
MPV: 10.3 fL (ref 7.5–12.5)
NEUTROS ABS: 4067 {cells}/uL (ref 1500–7800)
Neutrophils Relative %: 65.6 %
Platelets: 146 10*3/uL (ref 140–400)
RBC: 3.98 10*6/uL — AB (ref 4.20–5.80)
RDW: 12.7 % (ref 11.0–15.0)
Total Lymphocyte: 24 %
WBC mixed population: 558 cells/uL (ref 200–950)
WBC: 6.2 10*3/uL (ref 3.8–10.8)

## 2017-06-02 LAB — BASIC METABOLIC PANEL WITH GFR
BUN / CREAT RATIO: 14 (calc) (ref 6–22)
BUN: 20 mg/dL (ref 7–25)
CO2: 26 mmol/L (ref 20–32)
CREATININE: 1.38 mg/dL — AB (ref 0.70–1.11)
Calcium: 8.9 mg/dL (ref 8.6–10.3)
Chloride: 102 mmol/L (ref 98–110)
GFR, Est African American: 54 mL/min/{1.73_m2} — ABNORMAL LOW (ref >=60)
GFR, Est Non African American: 47 mL/min/{1.73_m2} — ABNORMAL LOW (ref >=60)
Glucose, Bld: 139 mg/dL — ABNORMAL HIGH (ref 65–99)
Potassium: 4.5 mmol/L (ref 3.5–5.3)
Sodium: 137 mmol/L (ref 135–146)

## 2017-06-02 LAB — MAGNESIUM: Magnesium: 2.1 mg/dL (ref 1.5–2.5)

## 2017-06-02 LAB — HEMOGLOBIN A1C
HEMOGLOBIN A1C: 5.9 %{Hb} — AB (ref <5.7)
Mean Plasma Glucose: 123 (calc)
eAG (mmol/L): 6.8 (calc)

## 2017-06-02 LAB — VITAMIN D 25 HYDROXY (VIT D DEFICIENCY, FRACTURES): VIT D 25 HYDROXY: 39 ng/mL (ref 30–100)

## 2017-06-02 LAB — LIPID PANEL
CHOL/HDL RATIO: 3.3 (calc) (ref <5.0)
CHOLESTEROL: 203 mg/dL — AB (ref <200)
HDL: 62 mg/dL (ref >40)
LDL CHOLESTEROL (CALC): 104 mg/dL — AB
Non-HDL Cholesterol (Calc): 141 mg/dL (calc) — ABNORMAL HIGH (ref <130)
Triglycerides: 257 mg/dL — ABNORMAL HIGH (ref <150)

## 2017-06-02 LAB — INSULIN, RANDOM: Insulin: 26.8 u[IU]/mL — ABNORMAL HIGH (ref 2.0–19.6)

## 2017-06-02 LAB — TSH: TSH: 2.55 mIU/L (ref 0.40–4.50)

## 2017-07-07 ENCOUNTER — Ambulatory Visit

## 2017-07-09 ENCOUNTER — Ambulatory Visit: Admitting: Cardiovascular Disease

## 2017-07-10 ENCOUNTER — Ambulatory Visit: Admitting: Cardiovascular Disease

## 2017-07-10 LAB — HX CBC W/ DIFF
CASE NUMBER: 2019123000685
HX ABSOLUTE NRBC COUNT: 0 10*3/uL
HX HCT: 39.9 % — NL (ref 39.0–53.0)
HX HGB: 13 g/dL — NL (ref 13.0–17.5)
HX MCH: 33.3 pg — NL (ref 26.0–34.0)
HX MCHC: 32.6 g/dL — NL (ref 31.0–37.0)
HX MCV: 102.3 fL — ABNORMAL HIGH (ref 80.0–100.0)
HX MPV: 10.7 fL — NL (ref 9.4–12.4)
HX NRBC PERCENT: 0 % — NL
HX PLATELET: 191 10*3/uL — NL (ref 150.0–400.0)
HX RBC: 3.9 10*6/uL — ABNORMAL LOW (ref 4.2–5.9)
HX RDW-CV: 13.7 % — NL (ref 11.5–14.5)
HX RDW-SD: 51.2 fL — ABNORMAL HIGH (ref 35.0–51.0)
HX WBC: 6.2 10*3/uL — NL (ref 4.0–11.0)

## 2017-07-10 LAB — HX BASIC METABOLIC PANEL
CASE NUMBER: 2019123000686
HX ANION GAP: 9 — NL (ref 3.0–11.0)
HX BUN: 16 mg/dL — NL (ref 8.0–23.0)
HX CALCIUM LVL: 9.3 mg/dL — NL (ref 8.5–10.5)
HX CHLORIDE: 106 mmol/L — NL (ref 98.0–110.0)
HX CO2: 28 mmol/L — NL (ref 21.0–32.0)
HX CREATININE: 1.57 mg/dL — ABNORMAL HIGH (ref 0.55–1.3)
HX GLUCOSE LVL: 93 mg/dL — NL (ref 70.0–110.0)
HX POTASSIUM LVL: 4.4 mmol/L — NL (ref 3.6–5.2)
HX SODIUM LVL: 143 mmol/L — NL (ref 136.0–146.0)

## 2017-07-10 LAB — HX .AUTOMATED DIFF
CASE NUMBER: 2019123000685
HX ABSOLUTE BASO COUNT: 0.02 10*3/uL — NL (ref 0.0–0.22)
HX ABSOLUTE EOS COUNT: 0.17 10*3/uL — NL (ref 0.0–0.45)
HX ABSOLUTE LYMPHS COUNT: 1.3 10*3/uL — NL (ref 0.74–5.04)
HX ABSOLUTE MONO COUNT: 0.77 10*3/uL — NL (ref 0.0–1.34)
HX ABSOLUTE NEUTRO COUNT: 3.93 10*3/uL — NL (ref 1.48–7.95)
HX BASOPHILS: 0.3 %
HX EOSINOPHILS: 2.7 %
HX IMMATURE GRANULOCYTES: 0.3 % — NL (ref 0.0–2.0)
HX LYMPHOCYTES: 20.9 %
HX MONOCYTES: 12.4 %
HX NEUTROPHILS: 63.4 %

## 2017-07-10 LAB — HX TSH
CASE NUMBER: 2019123000685
HX 3RD GEN TSH: 2.24 u[IU]/mL — NL (ref 0.358–3.74)

## 2017-07-10 LAB — HX LIPID PANEL
CASE NUMBER: 2019123000686
HX CHOL: 199 mg/dL — NL
HX HDL: 67 mg/dL — NL
HX LDL: 106 mg/dL — NL
HX TRIG: 130 mg/dL — NL

## 2017-07-10 LAB — HX NT-PROBNP
CASE NUMBER: 2019123000691
HX NT-PROBNP: 1534 pg/mL — NL (ref 0.0–1800.0)

## 2017-07-10 LAB — HX GLOMERULAR FILTRATION RATE (ESTIMATED)
CASE NUMBER: 2019123000686
HX AFN AMER GLOMERULAR FILTRATION RATE: 46 mL/min/{1.73_m2}
HX NON-AFN AMER GLOMERULAR FILTRATION RATE: 40 mL/min/{1.73_m2}

## 2017-07-12 ENCOUNTER — Emergency Department
Admit: 2017-07-12 | Disposition: A | Discharge status: Home / Self Care | Source: Home / Self Care | Attending: Family | Admitting: Family

## 2017-07-12 LAB — HX .AUTOMATED DIFF
CASE NUMBER: 2019125001146
HX ABSOLUTE BASO COUNT: 0.02 10*3/uL — NL (ref 0.0–0.22)
HX ABSOLUTE EOS COUNT: 0.18 10*3/uL — NL (ref 0.0–0.45)
HX ABSOLUTE LYMPHS COUNT: 1.02 10*3/uL — NL (ref 0.74–5.04)
HX ABSOLUTE MONO COUNT: 0.58 10*3/uL — NL (ref 0.0–1.34)
HX ABSOLUTE NEUTRO COUNT: 4.49 10*3/uL — NL (ref 1.48–7.95)
HX BASOPHILS: 0.3 %
HX EOSINOPHILS: 2.8 %
HX IMMATURE GRANULOCYTES: 0.5 % — NL (ref 0.0–2.0)
HX LYMPHOCYTES: 16.1 %
HX MONOCYTES: 9.2 %
HX NEUTROPHILS: 71.1 %

## 2017-07-12 LAB — HX CBC W/ DIFF
CASE NUMBER: 17921002
HX ABSOLUTE NRBC COUNT: 0 10*3/uL
HX HCT: 39.7 % — NL (ref 39.0–53.0)
HX HGB: 12.9 g/dL — ABNORMAL LOW (ref 13.0–17.5)
HX MCH: 33.4 pg — NL (ref 26.0–34.0)
HX MCHC: 32.5 g/dL — NL (ref 31.0–37.0)
HX MCV: 102.8 fL — ABNORMAL HIGH (ref 80.0–100.0)
HX MPV: 10.3 fL — NL (ref 9.4–12.4)
HX NRBC PERCENT: 0 % — NL
HX PLATELET: 187 10*3/uL — NL (ref 150.0–400.0)
HX RBC: 3.86 10*6/uL — ABNORMAL LOW (ref 4.2–5.9)
HX RDW-CV: 13.8 % — NL (ref 11.5–14.5)
HX RDW-SD: 53.1 fL — ABNORMAL HIGH (ref 35.0–51.0)
HX WBC: 6.3 10*3/uL — NL (ref 4.0–11.0)

## 2017-07-12 LAB — HX LAVENDER TOP TO HOLD: CASE NUMBER: 2019125001146

## 2017-07-12 LAB — HX BASIC METABOLIC PANEL
CASE NUMBER: 2019125001125
HX ANION GAP: 5 — NL (ref 3.0–11.0)
HX BUN: 20 mg/dL — NL (ref 8.0–23.0)
HX CALCIUM LVL: 9 mg/dL — NL (ref 8.5–10.5)
HX CHLORIDE: 107 mmol/L — NL (ref 98.0–110.0)
HX CO2: 27 mmol/L — NL (ref 21.0–32.0)
HX CREATININE: 1.68 mg/dL — ABNORMAL HIGH (ref 0.55–1.3)
HX GLUCOSE LVL: 134 mg/dL — ABNORMAL HIGH (ref 70.0–110.0)
HX POTASSIUM LVL: 4.6 mmol/L — NL (ref 3.6–5.2)
HX SODIUM LVL: 139 mmol/L — NL (ref 136.0–146.0)

## 2017-07-12 LAB — HX SST GOLD TUBE TO HOLD: CASE NUMBER: 2019125001146

## 2017-07-12 LAB — HX GLOMERULAR FILTRATION RATE (ESTIMATED)
CASE NUMBER: 2019125001125
HX AFN AMER GLOMERULAR FILTRATION RATE: 43 mL/min/{1.73_m2}
HX NON-AFN AMER GLOMERULAR FILTRATION RATE: 37 mL/min/{1.73_m2}

## 2017-07-12 LAB — HX TROPONIN I
CASE NUMBER: 2019125001125
HX TROPONIN I: 0.02 ng/mL — NL (ref 0.015–0.045)

## 2017-07-12 LAB — HX BLUE TOP TO HOLD: CASE NUMBER: 2019125001146

## 2017-07-18 LAB — HX .AUTOMATED DIFF
CASE NUMBER: 2019131000224
HX ABSOLUTE BASO COUNT: 0.03 10*3/uL — NL (ref 0.0–0.22)
HX ABSOLUTE EOS COUNT: 0.16 10*3/uL — NL (ref 0.0–0.45)
HX ABSOLUTE LYMPHS COUNT: 1.38 10*3/uL — NL (ref 0.74–5.04)
HX ABSOLUTE MONO COUNT: 0.73 10*3/uL — NL (ref 0.0–1.34)
HX ABSOLUTE NEUTRO COUNT: 4.38 10*3/uL — NL (ref 1.48–7.95)
HX BASOPHILS: 0.4 %
HX EOSINOPHILS: 2.4 %
HX IMMATURE GRANULOCYTES: 0.3 % — NL (ref 0.0–2.0)
HX LYMPHOCYTES: 20.6 %
HX MONOCYTES: 10.9 %
HX NEUTROPHILS: 65.4 %

## 2017-07-18 LAB — HX COMPREHENSIVE METABOLIC PANEL
CASE NUMBER: 2019131000224
HX ALBUMIN LVL: 3.3 g/dL — NL (ref 3.2–5.0)
HX ALKALINE PHOSPHATASE: 615 U/L — ABNORMAL HIGH (ref 30.0–117.0)
HX ALT: 36 U/L — NL (ref 6.0–55.0)
HX ANION GAP: 6 — NL (ref 3.0–11.0)
HX AST: 30 U/L — NL (ref 6.0–40.0)
HX BILIRUBIN TOTAL: 0.8 mg/dL — NL (ref 0.2–1.2)
HX BUN: 22 mg/dL — NL (ref 8.0–23.0)
HX CALCIUM LVL: 9.4 mg/dL — NL (ref 8.5–10.5)
HX CHLORIDE: 101 mmol/L — NL (ref 98.0–110.0)
HX CO2: 26 mmol/L — NL (ref 21.0–32.0)
HX CREATININE: 1.64 mg/dL — ABNORMAL HIGH (ref 0.55–1.3)
HX GLUCOSE LVL: 111 mg/dL — ABNORMAL HIGH (ref 70.0–110.0)
HX POTASSIUM LVL: 4.2 mmol/L — NL (ref 3.6–5.2)
HX SODIUM LVL: 133 mmol/L — ABNORMAL LOW (ref 136.0–146.0)
HX TOTAL PROTEIN: 7.3 g/dL — NL (ref 6.0–8.4)

## 2017-07-18 LAB — HX URINALYSIS (COMPLETE)
CASE NUMBER: 2019131000251
HX UA BILIRUBIN: NEGATIVE — NL
HX UA BLOOD: NEGATIVE — NL
HX UA GLUCOSE: NEGATIVE — NL
HX UA KETONES: 5 mg/dL — AB
HX UA LEUKOCYTE ESTERASE: NEGATIVE — NL
HX UA NITRITE: NEGATIVE — NL
HX UA PH: 6 — NL (ref 5.0–8.0)
HX UA PROTEIN: NEGATIVE — NL
HX UA RBC: <1 — NL (ref 0.0–2.0)
HX UA SPECIFIC GRAVITY: 1.008 — NL (ref 1.003–1.03)
HX UA SQUAMOUS EPITHELIAL: <1 — NL (ref 0.0–5.0)
HX UA UROBILINOGEN: 2 — AB
HX UA WBC: <1 — NL (ref 0.0–5.0)

## 2017-07-18 LAB — HX BLUE TOP TO HOLD: CASE NUMBER: 2019131000224

## 2017-07-18 LAB — HX CBC W/ DIFF
CASE NUMBER: 2019131000224
HX ABSOLUTE NRBC COUNT: 0 10*3/uL
HX HCT: 41.4 % — NL (ref 39.0–53.0)
HX HGB: 13.4 g/dL — NL (ref 13.0–17.5)
HX MCH: 32.4 pg — NL (ref 26.0–34.0)
HX MCHC: 32.4 g/dL — NL (ref 31.0–37.0)
HX MCV: 100.2 fL — ABNORMAL HIGH (ref 80.0–100.0)
HX MPV: 9.9 fL — NL (ref 9.4–12.4)
HX NRBC PERCENT: 0 % — NL
HX PLATELET: 252 10*3/uL — NL (ref 150.0–400.0)
HX RBC: 4.13 10*6/uL — ABNORMAL LOW (ref 4.2–5.9)
HX RDW-CV: 13.3 % — NL (ref 11.5–14.5)
HX RDW-SD: 49.7 fL — NL (ref 35.0–51.0)
HX WBC: 6.7 10*3/uL — NL (ref 4.0–11.0)

## 2017-07-18 LAB — HX SST GOLD TUBE TO HOLD: CASE NUMBER: 2019131000224

## 2017-07-18 LAB — HX GLOMERULAR FILTRATION RATE (ESTIMATED)
CASE NUMBER: 2019131000224
HX AFN AMER GLOMERULAR FILTRATION RATE: 44 mL/min/{1.73_m2}
HX NON-AFN AMER GLOMERULAR FILTRATION RATE: 38 mL/min/{1.73_m2}

## 2017-07-19 LAB — HX PT
CASE NUMBER: 2019132000392
HX INR: 1
HX PT: 11 s — NL (ref 9.3–11.6)

## 2017-07-19 LAB — HX .AUTOMATED DIFF
CASE NUMBER: 2019132000392
HX ABSOLUTE BASO COUNT: 0.04 10*3/uL — NL (ref 0.0–0.22)
HX ABSOLUTE EOS COUNT: 0.21 10*3/uL — NL (ref 0.0–0.45)
HX ABSOLUTE LYMPHS COUNT: 0.99 10*3/uL — NL (ref 0.74–5.04)
HX ABSOLUTE MONO COUNT: 0.73 10*3/uL — NL (ref 0.0–1.34)
HX ABSOLUTE NEUTRO COUNT: 3.85 10*3/uL — NL (ref 1.48–7.95)
HX BASOPHILS: 0.7 %
HX EOSINOPHILS: 3.6 %
HX IMMATURE GRANULOCYTES: 0.3 % — NL (ref 0.0–2.0)
HX LYMPHOCYTES: 17 %
HX MONOCYTES: 12.5 %
HX NEUTROPHILS: 65.9 %

## 2017-07-19 LAB — HX BASIC METABOLIC PANEL
CASE NUMBER: 2019132000392
HX ANION GAP: 5 — NL (ref 3.0–11.0)
HX BUN: 19 mg/dL — NL (ref 8.0–23.0)
HX CALCIUM LVL: 9 mg/dL — NL (ref 8.5–10.5)
HX CHLORIDE: 103 mmol/L — NL (ref 98.0–110.0)
HX CO2: 29 mmol/L — NL (ref 21.0–32.0)
HX CREATININE: 1.54 mg/dL — ABNORMAL HIGH (ref 0.55–1.3)
HX GLUCOSE LVL: 116 mg/dL — ABNORMAL HIGH (ref 70.0–110.0)
HX POTASSIUM LVL: 4.8 mmol/L — NL (ref 3.6–5.2)
HX SODIUM LVL: 137 mmol/L — NL (ref 136.0–146.0)

## 2017-07-19 LAB — HX CBC W/ DIFF
CASE NUMBER: 2019132000392
HX ABSOLUTE NRBC COUNT: 0 10*3/uL
HX HCT: 41.3 % — NL (ref 39.0–53.0)
HX HGB: 13.3 g/dL — NL (ref 13.0–17.5)
HX MCH: 32.5 pg — NL (ref 26.0–34.0)
HX MCHC: 32.2 g/dL — NL (ref 31.0–37.0)
HX MCV: 101 fL — ABNORMAL HIGH (ref 80.0–100.0)
HX MPV: 9.6 fL — NL (ref 9.4–12.4)
HX NRBC PERCENT: 0 % — NL
HX PLATELET: 242 10*3/uL — NL (ref 150.0–400.0)
HX RBC: 4.09 10*6/uL — ABNORMAL LOW (ref 4.2–5.9)
HX RDW-CV: 13.5 % — NL (ref 11.5–14.5)
HX RDW-SD: 50.4 fL — NL (ref 35.0–51.0)
HX WBC: 5.8 10*3/uL — NL (ref 4.0–11.0)

## 2017-07-19 LAB — HX GLOMERULAR FILTRATION RATE (ESTIMATED)
CASE NUMBER: 2019132000392
HX AFN AMER GLOMERULAR FILTRATION RATE: 48 mL/min/{1.73_m2}
HX NON-AFN AMER GLOMERULAR FILTRATION RATE: 41 mL/min/{1.73_m2}

## 2017-07-19 LAB — HX PTT
CASE NUMBER: 2019132000392
HX APTT: 29 s — NL (ref 23.0–32.0)

## 2017-07-20 ENCOUNTER — Inpatient Hospital Stay
Admit: 2017-07-20 | Disposition: A | Discharge status: Skilled Nursing Facility | Source: Home / Self Care | Attending: Nephrology | Admitting: Nephrology

## 2017-07-20 NOTE — Discharge Summary (Signed)
__________________________________________________________________________    DISCHARGE SUMMARY    ADMITTED:  07/18/2017  DISCHARGED:  07/22/2017      DISCHARGE DIAGNOSES:  1.  Status post fall.  2.  Compression fracture in the back.  3.  Metastatic cancer to the back, possibly prostate.  4.  History of prostate cancer.  5.  History of hypertension.  6.  Hyperlipidemia.  7.  Chronic renal failure.  8.  Status post cardioversion for atrial fibrillation.  9.  Constipation.  10.  Anxiety.   11.  Depression.    DISCHARGE MEDICATIONS:  1.  Xanax p.r.n. for anxiety.  2.  Aspirin 81 mg daily.  3.  Omeprazole 20 mg daily.  4.  Percocet 325 one q.6h.  p.r.n.  5.  Fentanyl patch 12 mcg per hour every three days.   6.  Gabapentin 300 mg t.i.d.  7.  Lisinopril 5 mg daily.  8.  Metoprolol 100 mg.      HOSPITAL COURSE:  This patient came into the hospital after a fall down a flight  of stairs resulting in back injury.  He came in with severe pain to the back.   There was no evidence of underlying infection or stroke.    The patient underwent extensive evaluation including CT of the head, CT of the  spine and MRI of the spine.    There was evidence of metastatic disease to the back and mild compression  fracture of L2.  There was no evidence of spinal cord compression.    The patient's pain medications were adjusted.  Fentanyl patch was added with  good relief.  Oncology was consulted as well as Orthopedics.    At this time, we are evaluating the patient for discharge to rehab or home.  He  is to be seen as an outpatient for a brace -fitting and he will continue to  followup with Oncology.    DISCHARGE CONDITION:  Stable.    DISPOSITION:  Home versus rehabilitation based on PT evaluation.    Dictated by:  Dianah Field, [name]    D:  07/22/2017 08:51:17  T:  07/22/2017 15:01:32  E:  07/22/2017 15:01:32  EN/tam  Job# [id number]   SIGNATURE LINE    Electronically signed by Susette Racer MD, Collene Leyden on 07/23/2017 at 08:21:33 EST

## 2017-07-21 LAB — HX LAVENDER TOP TO HOLD: CASE NUMBER: 2019134000464

## 2017-07-22 ENCOUNTER — Ambulatory Visit: Admitting: Urology

## 2017-07-22 ENCOUNTER — Ambulatory Visit

## 2017-07-22 NOTE — Progress Notes (Signed)
* * *        **[name]    --- ---    51 Y old Male, DOB: [birth year]    [address] [place], [place] [address]    Home: [phone]    Provider: [name]        * * *    Telephone Encounter    ---    Answered by   [name]  Date: 07/22/2017         Time: 02:44 PM    Caller   [name]    --- ---            Reason   Please Advise            Message                      PT was seen at [facility] goign to [facility] for Rehab and needs sooner appt w Dr. [name] [phone] x [phone]                                  Action Taken                      [name],[name]  07/22/2017 2:45:00 PM >       [name],[name]  07/22/2017 3:42:46 PM > [name], please get any recent records.      [name],[name]  07/22/2017 3:50:11 PM > Recent [facility] info in doc      [name],[name]  07/22/2017 4:58:49 PM > please get [facility] notes       [name],[name]  07/23/2017 9:19:16 AM > info in docs       [name],[name]  07/23/2017 9:33:16 AM > pt was seen at [facility] back pain- possible mets to the spine. Pt cancelled 12/2016 appt and did not have bone scan that i can see as recommended at 08/2016 appt. i can try and get im in sooner do you want a bone scan? prior or just 15 minute ov?       [name] 07/23/2017 11:32:35 AM > he needs a visit and lets also get a PSA on him before he sees me: see virtual visit: maybe it can be drawn at [facility]?       [name],[name]  07/23/2017 11:46:33 AM > pt is at [facility]- spoke with roney order faxed order to [phone]- can you please call [facility] back with appt given, ty      [name],[name]  07/23/2017 3:10:29 PM > spoke to [name] and gave her appt info                    * * *              * * *        ---        Reason for Appointment    ---      1\. Please Advise    ---      Assessments    ---    1\. Prostate cancer - C61 (Primary)    ---      Treatment    ---       **1\. Prostate cancer**    _LAB: PSA Diagnostic_    ---          * *  *  Patient: [name], [name] DOB: 05/26/1931 Provider: [name]  07/22/2017    ---    Note generated by eClinicalWorks EMR/PM Software ([email or web address])

## 2017-07-23 ENCOUNTER — Ambulatory Visit: Admitting: Urology

## 2017-07-23 LAB — HX PROSTATE SPECIFIC ANTIGEN (PSA) FREE AND
CASE NUMBER: 2019134000419
HX PSA FREE: 11.66 ng/mL
HX PSA TOTAL: 245 ng/mL — ABNORMAL HIGH

## 2017-07-24 ENCOUNTER — Ambulatory Visit (HOSPITAL_BASED_OUTPATIENT_CLINIC_OR_DEPARTMENT_OTHER): Admitting: Urology

## 2017-08-10 ENCOUNTER — Ambulatory Visit

## 2017-08-12 ENCOUNTER — Ambulatory Visit: Admitting: Urology

## 2017-08-12 NOTE — Progress Notes (Signed)
* * *        **[name]    --- ---    47 Y old Male, DOB: 12-16-1931    Account Number: [id number]    [address] , [place], [place]    Home: [phone]    Guarantor: [name], Lawyer Insurance: [name] Medicare Complete Payer ID:  [id number]    PCP: [name], MD    Appointment Facility: [facility], [facility].        * * *    08/12/2017  Progress Notes: [name], [name]    --- ---    ---         **Current Medications**    ---    Taking     * Omeprazole 20 MG Capsule Delayed Release 1 capsule Orally Once a day    ---    * Acetaminophen 500 MG Capsule 1 capsule as needed Orally every 6 hrs    ---    * Fentanyl 50 MCG/HR Patch 72 Hour 1 patch to skin Transdermal     ---    * Gabapentin 300 MG Capsule 1 capsule Orally Once a day    ---    * Lisinopril 5 MG Tablet 1 tablet Orally Once a day    ---    * Metoprolol Succinate ER 25 MG Tablet Extended Release 24 Hour 1 tablet Orally Once a day    ---    * MiraLax - Powder as directed Orally     ---    * Oxycodone HCl 5 MG/5ML Solution 5 ml as needed Orally every 6 hrs    ---    * Milk of Magnesia 400 MG/5ML Suspension 5 ml as needed Orally Four times a day    ---    * Bisacodyl 10 MG Suppository 1 suppository as needed Rectal Once a day    ---    * Aspirin 81 MG Tablet Delayed Release 1 tablet Orally Once a day    ---    Not-Taking/PRN    * Centrum Silver Tablet as directed Orally     ---    * Hydrochlorothiazide 25 MG Tablet 1 tablet Orally Once a day    ---    * Xanax 0.25 MG Tablet 1 tablet Orally Twice a day    ---    * Hydrocodone-Acetaminophen 5-325 MG Tablet 1 tablet as needed Orally every 6 hrs    ---    * Amiodarone HCl 200 MG Tablet 1 tablet Orally Once a day    ---    * Zestril 5 MG Tablet 1 tablet Orally Once a day    ---    * Zocor 10 MG Tablet 1 tablet every evening Orally Once a day    ---    * Xarelto 15 MG Tablet Orally     ---    * Medication List reviewed and reconciled with the patient    ---      Past  Medical History    ---       prostate cancer: Dx'd 7/06; S/P XRT: 11/06-1/07: gleason's 8.        ---    GERD.        ---    Hypercholesterolemia.        ---    Hypertension.        ---    Anxiety.        ---    Atrial Fibrillation: @ 2017.        ---  ED - Impotence of organic origin.        ---    Renal insufficiency.        ---       **Surgical History**    ---       Trans Rectal [place] and Prostate BX 09/2004    ---    cholecystectomy 04/2004    ---    inguinal hernia repair    ---    cystoscopy: c/w rad changes 08/27/2016    ---    cardioversion    ---      **Family History**    ---       No Family History documented.    ---       **Social History**    ---    Alcohol: yes, occasional.    no Smoking status: never smoker, Patient counseled on the dangers of tobacco  use: 08/20/2016.    no Caffeine.      **Allergies**    ---       N.K.D.A.    ---       **Hospitalization/Major Diagnostic Procedure**    ---       fall/compressionfx c/we met disease 5/11-5/15/19    ---       **Review of Systems**    ---     _General_ :    Fatigue yes. no flank pain. no Fever. no Chills. Weakness yes.    _Cardiovascular_ :    no Chest pain.    _Gastroenterology_ :    no Constipation. no Diarrhea.    _Urology_ :    no Dysuria. no Blood in urine. no Frequent urination. no Urinary incontinence.            **Reason for Appointment**    ---       1\. Prostate cancer follow up    ---       **History of Present Illness**    ---     _Urological History_ :    pt not seen in close to year due to social issues ( pt in [place],  etc.) now returns with a very high PSA. he was recently hospitalized with a  fall and compression Fx c/w met disease: he is here with his 2 daughters. he  is voiding well.       **Vital Signs**    ---    Ht 5'7'', Wt 168, BMI 26.31, BP 130/74, Temp 97.9, RR 16.       **Examination**    ---     _General_ :    General appearance:  A&O x 3, no acute distress, pleasant. HEENT  Head:  normocephalic, head: atraumatic, no  icterus. Lungs/Respirations:  No labored  breathing, good excursion. Abdomen:  pt in a back brace; not examined.  Musculoskeletal:  as above: walks slowly with a walker. Skin:  normal turgor.    GU exam deferred.           **Assessments**    ---    1\. Prostate cancer - C61 (Primary)    ---       **Treatment**    ---       **1\. Prostate cancer**    Start Casodex Tablet, 50 MG, 1 tablet, Orally, Once a day, 30 day(s), 30,  Refills 5    _IMAGING: NM Bone Imaging Whole Body e-sch*_     PSA 185 with MRI proven  spinal mets: look at bones as a whole Scheduled at [facility] on    --- ---  Notes: I spoke with the pt and his 2 daughters and we spoke about the fact  that he has metastatic prostate cancer based on his PSA and his MRI of the  spine. We discussed about treatment with Hormonal therapy and the side  effects. We discussed about Lupron every 3 months and starting on Casodex to  prevent a flare. I also want to get a nuclear bone scan to evaluate the rest  of his bones besides his spine. We discussed about other newer antiandrogen's  such as Xtandi but may have to get PA for that so for now I want to start him  on Casodex. We also talked about an orchiectomy. all ?'s answered. he also  will be seeing Dr. Lynann Bologna ( med onc) later this week at [facility].        **2\. Others**    Notes: the pt and his daughters asked me about travel He wants to go back to  [place]. first of all, due ot his compression FX, I told him that the  orthopedist, not myself can give him permission to travel. Secondly, it was  his going to [place] last year that prevented his follow up with me ( he has no  doctor in [place]) and thus if he had followed up we might have been able to start  his hormonal therapy earlier.. I did suggest that if this si really what he  wants to do, we could wait and see if the initial Lupron seems ot help and if  it does, we can do an orchiectomy which would be permanent but that I would  still recommend  that he needs follow up. Unfortunately he is very stubborn and  was focused a lot on returning to [facility]. .        **Labs**    --- ---    _Lab: ClinitekStatus SG_    ---       GLU  Negative     \-    --- --- --- ---    BIL  Negative     \-    --- --- --- ---    KET  Negative     \-    --- --- --- ---    SG  1.015     \-    --- --- --- ---    BLO  Negative     \-    --- --- --- ---    pH  5.5     \-    --- --- --- ---    PRO  Negative     \-    --- --- --- ---    URO  2.0 E.U./dL     \-    --- --- --- ---    NIT  Negative     \-    --- --- --- ---    LEU  Negative     \-    --- --- --- ---         Sherlene Shams 08/12/2017 1:19:38 PM > Bryndle Corredor E 08/12/2017  1:39:18 PM >    --- ---       **Procedure Codes**    ---       81003 UA Automated    ---       **Follow Up**    ---    lupron injeciton wiht nurse or NP in about 2 weeks ( must ahve bone scan  before that and not earlier than 2 week)  Electronically signed by Leola Brazil , MD on 08/12/2017 at 01:49 PM EDT    Sign off status: Completed        * * [facility]    [address]    [place], [place] [id number]    Tel: [phone]    Fax: [phone]              * * *          Patient: Pape, Parson DOB: 1931/09/08 Progress Note: [name].  [name], [name] 08/12/2017    ---    Note generated by eClinicalWorks EMR/PM Software ([email or web address])

## 2017-08-14 ENCOUNTER — Ambulatory Visit: Admitting: Internal Medicine

## 2017-08-14 NOTE — Other (Addendum)
Patient:    [name], [name]            MRN: [id number]            FIN: [id number]               Age:   84 years     Sex:  Male     DOB:  09/21/1931   Associated Diagnoses:   None   Author:   Chryl Holten MD, Yumna Ebers      CC: Dr. Elias Nabbout    Chief Complaint: metastatic prostate cancer    History of Presenting Illness:  This is a 84-year-old male with history of a flutter, GERD, hyperlipidemia and hypertension now here for consultation regarding his new diagnosis of metastatic prostate cancer.  He is accompanied by his 2 daughters today     Briefly, he was recently admitted to the hospital s/p fall down the stairs, resulting in a back injury.  Extensive work-up including CT head, CT spine and MRI spine was done.  It revealed evidence of metastatic disease to the back and a mild compression fracture of L2.  There was no evidence of spinal cord compression.  PSA was checked and it returned high at 245.  He was treated conservatively for his fracture and was started on pain meds.  He was discharged to rehab where he currently resides.     Currently patient is feeling okay.  He states that his back pain is at level 8 out of 10 most of the time, in spite of being on a fentanyl patch.  He has oxycodone available for breakthrough pain but he is not in the habit of asking for it much.  Denies any neurological symptoms.      Of note, he was given a prescription for bicalutamide at discharge. He started taking it today. He has an appt with his urologist Dr. Altman next week, at which time he is scheduled to receive a Lupron shot.            ROS:  Constitutional: fatigue  Eye: Negative  ENMT: Negative  Respiratory: Negative  Cardiovascular: Negative  Gastrointestinal: Negative  Genitourinary: Negative  Heme/Lymph: Negative  Endocrine: Negative  Immunologic: Negative  Musculoskeletal: back pain  Integumentary: Negative  Neurologic: Negative  Psychiatric: Negative  All other ROS: Negative     Past Medical  History:  Anxiety  Arthritis  Atrial flutter  Back pain  GERD - Gastro-esophageal reflux disease  HTN  Hyperlipidemia  Prostate cancer  SOB - Shortness of breath      Social History:  Alcohol  Details: None  Employment/School  Details: Retired  Home/Environment  Details: Lives with Children.; Comment(s): lives with daughter and brother has 7 children  Nutrition/Health  Details: Regular  Substance Abuse  Details: Never  Tobacco  Details: Former smoker      Family History:  Daughter: High blood pressure; Kidney transplant  Daughter: High blood pressure        Allergies:  Allergies (1) Active Reaction  NKA None Documented  ALLERGIES         Home Meds:   Medication List     Active Medications         Documented             acetaminophen: 650 mg, 2 tab(s), PO, q4hr, PRN: Fever/Pain, Mild to               Moderate, 0 Refill(s).               Al hydroxide/Mg hydroxide/simethicone: 30 mL, PO, q4hr, PRN:               Dyspepsia, 0 Refill(s).             ALPRAZolam: PRN: as needed for anxiety, 0 Refill(s).             aspirin: 81 mg, Chewed, Daily, 0 Refill(s).             bicalutamide: 50 mg, 1 tab(s), PO, q24hr, 0 Refill(s).             codeine-guaifenesin: PO, q4hr, 10ml twice a day as needed, 0               Refill(s).             docusate: 100 mg, 1 cap(s), PO, BID, PRN: Constipation, 0 Refill(s).             fentaNYL: 25 mcg, q72hr, change q 3 days fentayl 25 mgPartial fill               upon request, 0 Refill(s).             gabapentin: 300 mg, 1 cap(s), PO, TID, 0 Refill(s).             lisinopril: 5 mg, 1 tab(s), PO, Daily, 0 Refill(s).             loperamide: 2 mg, 1 tab(s), PO, q6hr, as needed, 0 Refill(s).             metoprolol: 25 mg, 1 cap(s), PO, Daily, hold BP less than 100mg, 0               Refill(s).             omeprazole: 20 mg, PO, Daily, 0 Refill(s).             ondansetron: 4 mg, 1 tab(s), PO, q8hr, as needed, 0 Refill(s).             oxyCODONE: 5 mg, 1 tab(s), PO, q6hr, Partial fill upon request, 0                Refill(s).             polyethylene glycol 3350: 17 Gm, PO, Daily, PRN: Constipation, 0               Refill(s).     Medications Inactivated in the Last 72 Hours         fentaNYL: 12 mcg/hr, 1 patch(es), Transderm, q72hr, Partial fill upon           request, 3 patch(es), 0 Refill(s).         metoprolol: 0 Refill(s).         omeprazole: 20 mg, PO, Daily, 0 Refill(s).        Physical Exam:  Vital signs:  per RN assessment  ECOG: 1-2  General: elderly male appearing weak, not in acute distress  Head and neck: normocephalic, no masses  Eyes, Ear and Throat: moist mucous membranes, extraocular muscles intact, anicteric sclera  Cardiovascular: regular rate and rhythm, no murmurs, gallops or rubs  Respiratory: clear to auscultation bilaterally, no wheezes or crackles  Abdomen: soft, nontender, nondistended, no organomegaly  Extremities: warm, perfused, no edema  Back: with a brace      Labs:  07/21/17  PSA 245        Imaging:  07/20/17 MRI L spine  IMPRESSION:   Multilevel bony metastatic   disease which is most prominent in the sacrum and L1  and L5 vertebral bodies.  Acute mild compression fracture in the L2 superior endplate. A pathologic  fracture cannot be excluded.  Suspected small hematoma in the L1-2 and L1 right posterolateral epidural space  causing moderate central spinal stenosis.  Central spinal stenosis secondary to degenerative change which is moderate at  L2-3 and L4-5, mild to moderate at L5-S1, and mild at L3-4.        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     I reviewed his imaging thus far as well as the high PSA which indicates the diagnosis of stage IV prostate cancer.  I recommend staging scans, CT chest/abdomen/pelvis and bone scan.  I agree with starting bicalutamide now. After a few days, he will receive his first dose of Lupron.  I recommend also starting Xgeva for his bony mets. It is a monthly dose, and  I will order that to start next week. Eventually, bicalutamide can be discontinued and patient will be maintained on Lupron alone until progression. Will discuss more at the next visit.     For his pain, he will continue on fentanyl patch and oxycodone as needed.  I encouraged him to not hesitate to use the oxycodone as needed for his breakthrough pain.  I will also refer him to RadOn[facility] to see if there is a role for palliative XRT.     Patient is agreeable to plan.  He will return for follow-up in 1 month.  He knows to call with questions/concerns anytime.                    Reviewed and electronically verified by:  Pasqualina Colasurdo MD, Paulina Muchmore                                                                     on:  08/17/2017 17:43Reviewed and electronically authenticated by:  Harnoor Kohles                                                                       on:  08/17/17 17:43

## 2017-08-14 NOTE — Other (Deleted)
[email or web address] Intake Entered On:  08/14/2017 11:49     Performed On:  08/14/2017 11:35  by Margaretha Glassing RN, [name]               Vital Signs   Pain Scale Used :   0-10   Temperature Oral :   96.6 DegF   Peripheral Pulse Rate :   52 bpm   Respiratory Rate :   16 br/min   Systolic Blood Pressure :   164 mmHg (HI)    Diastolic Blood Pressure :   78 mmHg   Blood Pressure Extremity :   Right arm   Mean Arterial Pressure :   107 mmHg   SpO2 :   98 %   Oxygen Therapy 1 :   Room air   [facility], [name] - 08/14/2017 11:35    Height/Weight   Height :   170 cm(Converted to: 5 foot 7 in, 66.93 in)    Body Surface Area :   1.86 m2   Weight :   75.1 Kg(Converted to: 165 lb 9 oz)    Body Mass Index :   25.99 Kg/m2   Type of Weight :   Standing   Type of Height :   Stated height   Max Fickle, [name] - 08/14/2017 11:35    [facility] General Information/Subjective   Chief Complaint :   new consult prosatae cancer   High Risk for Falls :   Yes   Cancer Fatigue Scale :   9   Cancer Fatigue Score :   9    Preferred Language :   English   Preferred Communication Mode :   Written   Pain Symptoms :   Yes   Max Fickle, [name] - 08/14/2017 11:35    Primary Pain   Primary Pain Location :   Lower back   Pain Scale Used :   0-10   Pain Score :   9    Primary Pain Interventions :   Medications   Max Fickle, [name] - 08/14/2017 11:35    Image 1 -  Images currently included in the form version of this document have not been included in the text rendition version of the form.   [facility] Infectious Disease Risk Screening   Patient Travel History :   No recent travel   Recent Family Travel History :   No recent travel   Max Fickle, [name] - 08/14/2017 11:35    Allergy   (As Of: 08/14/2017 11:49:23 )   Allergies (Active)   NKA  Estimated Onset Date:   Unspecified ; Created By:   Ronnie Doss; Reaction Status:   Active ; Category:   Drug ; Substance:   NKA ; Type:   Allergy ; Updated By:   Ronnie Doss; Reviewed Date:   08/14/2017 11:37         Medication History   Medication  List   (As Of: 08/14/2017 11:49:23 )   Prescription/Discharge Order    fentaNYL  :   fentaNYL ; Status:   Completed ; Ordered As Mnemonic:   fentaNYL 12 mcg/hr transdermal film, extended release ; Simple Display Line:   12 mcg/hr, 1 patch(es), Transderm, q72hr, Partial fill upon request, 3 patch(es), 0 Refill(s) ; Ordering Provider:   Susette Racer MD, Collene Leyden; Catalog Code:   fentaNYL ; Order Dt/Tm:   07/22/2017 08:44:20            Home Meds    metoprolol  :  metoprolol ; Status:   Discontinued ; Ordered As Mnemonic:   metoprolol succinate 100 mg oral capsule, extended release ; Simple Display Line:   0 Refill(s) ; Ordering Provider:   Susette Racer MD, Collene Leyden; Catalog Code:   metoprolol ; Order Dt/Tm:   07/22/2017 08:43:57          docusate  :   docusate ; Status:   Documented ; Ordered As Mnemonic:   docusate sodium 100 mg oral capsule ; Simple Display Line:   100 mg, 1 cap(s), PO, BID, PRN: Constipation, 0 Refill(s) ; Ordering Provider:   Susette Racer MD, Collene Leyden; Catalog Code:   docusate ; Order Dt/Tm:   07/22/2017 08:43:25          polyethylene glycol 3350  :   polyethylene glycol 3350 ; Status:   Documented ; Ordered As Mnemonic:   MiraLax oral powder for reconstitution ; Simple Display Line:   17 Gm, PO, Daily, PRN: Constipation, 0 Refill(s) ; Catalog Code:   polyethylene glycol 3350 ; Order Dt/Tm:   08/14/2017 11:47:11          loperamide  :   loperamide ; Status:   Documented ; Ordered As Mnemonic:   loperamide 2 mg oral tablet ; Simple Display Line:   2 mg, 1 tab(s), PO, q6hr, as needed, 0 Refill(s) ; Catalog Code:   loperamide ; Order Dt/Tm:   08/14/2017 11:38:26          omeprazole  :   omeprazole ; Status:   Discontinued ; Ordered As Mnemonic:   omeprazole ; Simple Display Line:   20 mg, PO, Daily, 0 Refill(s) ; Catalog Code:   omeprazole ; Order Dt/Tm:   10/19/2015 07:42:45          omeprazole  :   omeprazole ; Status:   Documented ; Ordered As Mnemonic:   omeprazole ; Simple Display Line:   20 mg, PO, Daily, 0  Refill(s) ; Catalog Code:   omeprazole ; Order Dt/Tm:   08/14/2017 11:45:57          fentaNYL  :   fentaNYL ; Status:   Documented ; Ordered As Mnemonic:   fentaNYL ; Simple Display Line:   25 mcg, q72hr, change q 3 days fentayl 25 mgPartial fill upon request, 0 Refill(s) ; Catalog Code:   fentaNYL ; Order Dt/Tm:   08/14/2017 11:39:32          metoprolol  :   metoprolol ; Status:   Documented ; Ordered As Mnemonic:   metoprolol succinate 25 mg oral capsule, extended release ; Simple Display Line:   25 mg, 1 cap(s), PO, Daily, hold BP less than 100mg , 0 Refill(s) ; Catalog Code:   metoprolol ; Order Dt/Tm:   08/14/2017 11:45:01          Al hydroxide/Mg hydroxide/simethicone  :   Al hydroxide/Mg hydroxide/simethicone ; Status:   Documented ; Ordered As Mnemonic:   aluminum hydroxide/magnesium hydroxide/simethicone 200 mg-200 mg-20 mg/5 mL oral suspension ; Simple Display Line:   30 mL, PO, q4hr, PRN: Dyspepsia, 0 Refill(s) ; Ordering Provider:   Susette Racer MD, Collene Leyden; Catalog Code:   Al hydroxide/Mg hydroxide/simethicone ; Order Dt/Tm:   07/22/2017 08:43:18          gabapentin  :   gabapentin ; Status:   Documented ; Ordered As Mnemonic:   gabapentin 300 mg oral capsule ; Simple Display Line:   300 mg, 1 cap(s), PO, TID, 0 Refill(s) ; Catalog Code:  gabapentin ; Order Dt/Tm:   07/12/2017 18:01:00          ondansetron  :   ondansetron ; Status:   Documented ; Ordered As Mnemonic:   Zofran 4 mg oral tablet ; Simple Display Line:   4 mg, 1 tab(s), PO, q8hr, as needed, 0 Refill(s) ; Catalog Code:   ondansetron ; Order Dt/Tm:   08/14/2017 11:46:16          lisinopril  :   lisinopril ; Status:   Documented ; Ordered As Mnemonic:   lisinopril 5 mg oral tablet ; Simple Display Line:   5 mg, 1 tab(s), PO, Daily, 0 Refill(s) ; Ordering Provider:   Susette Racer MD, Collene Leyden; Catalog Code:   lisinopril ; Order Dt/Tm:   07/22/2017 08:43:40          oxyCODONE  :   oxyCODONE ; Status:   Documented ; Ordered As Mnemonic:   oxyCODONE 5 mg  oral tablet ; Simple Display Line:   5 mg, 1 tab(s), PO, q6hr, Partial fill upon request, 0 Refill(s) ; Catalog Code:   oxyCODONE ; Order Dt/Tm:   08/14/2017 11:46:48          bicalutamide  :   bicalutamide ; Status:   Documented ; Ordered As Mnemonic:   bicalutamide 50 mg oral tablet ; Simple Display Line:   50 mg, 1 tab(s), PO, q24hr, 0 Refill(s) ; Catalog Code:   bicalutamide ; Order Dt/Tm:   08/14/2017 11:37:59          acetaminophen  :   acetaminophen ; Status:   Documented ; Ordered As Mnemonic:   acetaminophen 325 mg oral tablet ; Simple Display Line:   650 mg, 2 tab(s), PO, q4hr, PRN: Fever/Pain, Mild to Moderate, 0 Refill(s) ; Ordering Provider:   Susette Racer MD, Collene Leyden; Catalog Code:   acetaminophen ; Order Dt/Tm:   07/22/2017 08:43:08          aspirin  :   aspirin ; Status:   Documented ; Ordered As Mnemonic:   aspirin ; Simple Display Line:   81 mg, Chewed, Daily, 0 Refill(s) ; Catalog Code:   aspirin ; Order Dt/Tm:   07/18/2017 08:40:53          codeine-guaifenesin  :   codeine-guaifenesin ; Status:   Documented ; Ordered As Mnemonic:   Robitussin AC ; Simple Display Line:   PO, q4hr, 10ml twice a day as needed, 0 Refill(s) ; Catalog Code:   codeine-guaifenesin ; Order Dt/Tm:   08/14/2017 11:47:36          ALPRAZolam  :   ALPRAZolam ; Status:   Documented ; Ordered As Mnemonic:   Xanax ; Simple Display Line:   PRN: as needed for anxiety, 0 Refill(s) ; Catalog Code:   ALPRAZolam ; Order Dt/Tm:   09/07/2015 07:07:23            [facility] Social History   Cigarette Smoking Last 365 Days :   No   Exposure to Tobacco Smoke :   Former smoker   Patient used other tobacco products in the last 30 days? :   No   Max Fickle, [name] - 08/14/2017 11:35    Social History   (As Of: 08/14/2017 11:49:23 )   Tobacco:        Former smoker   (Last Updated: 10/19/2015 07:51:16  by Konrad Penta RN, Johnny Bridge)          Alcohol:  None   (Last Updated: 10/19/2015 07:51:23  by Konrad Penta RN, Johnny Bridge)          Employment/School:        Retired   (Last  Updated: 08/14/2017 11:40:49  by Margaretha Glassing RN, [name])          Nutrition/Health:        Regular   (Last Updated: 08/14/2017 11:41:02  by Margaretha Glassing RN, [name])          Substance Abuse:        Never   (Last Updated: 10/19/2015 07:51:50  by Konrad Penta RN, Johnny Bridge)          Home/Environment:        Lives with Children.   Comments:  08/14/2017 11:49 - Margaretha Glassing RN, [name]: lives with daughter and brother has 7 children   (Last Updated: 08/14/2017 11:49:19  by Margaretha Glassing RN, [name])            Advance Directive   Advanced Directives :   Yes   (Comment: family to bring in [place] RN, [name] - 08/14/2017 11:50 ] )       Margaretha Glassing RN, [name] - 08/14/2017 11:50      [facility] ONC Nutrition   Weight Change > 10lbs in 6 Months :   No   Home Diet :   Regular   Appetite :   Marchia Meiers RN, [name] - 08/14/2017 11:35    Nutritional Risk Factors   Nausea/Vomiting/Diarrhea X 3 Days :   Yes   (Comment: diarrhea Margaretha Glassing RN, [name] - 08/14/2017 11:35 ] )   Margaretha Glassing RN, [name] - 08/14/2017 11:35    [facility] Encounter   Onc Type of Patient :   Outpatient Visit Existing   Onc Visit Level, Existing :   Level 2   Max Fickle, [name] - 08/14/2017 11:35

## 2017-08-14 NOTE — Other (Deleted)
Patient:    JR, Rakwon J            MRN: [id number]            FIN: [id number]               Age:   84 years     Sex:  Male     DOB:  09/21/1931   Associated Diagnoses:   None   Author:   Chryl Holten MD, Yumna Ebers      CC: Dr. Elias Nabbout    Chief Complaint: metastatic prostate cancer    History of Presenting Illness:  This is a 84-year-old male with history of a flutter, GERD, hyperlipidemia and hypertension now here for consultation regarding his new diagnosis of metastatic prostate cancer.  He is accompanied by his 2 daughters today     Briefly, he was recently admitted to the hospital s/p fall down the stairs, resulting in a back injury.  Extensive work-up including CT head, CT spine and MRI spine was done.  It revealed evidence of metastatic disease to the back and a mild compression fracture of L2.  There was no evidence of spinal cord compression.  PSA was checked and it returned high at 245.  He was treated conservatively for his fracture and was started on pain meds.  He was discharged to rehab where he currently resides.     Currently patient is feeling okay.  He states that his back pain is at level 8 out of 10 most of the time, in spite of being on a fentanyl patch.  He has oxycodone available for breakthrough pain but he is not in the habit of asking for it much.  Denies any neurological symptoms.      Of note, he was given a prescription for bicalutamide at discharge. He started taking it today. He has an appt with his urologist Dr. Altman next week, at which time he is scheduled to receive a Lupron shot.            ROS:  Constitutional: fatigue  Eye: Negative  ENMT: Negative  Respiratory: Negative  Cardiovascular: Negative  Gastrointestinal: Negative  Genitourinary: Negative  Heme/Lymph: Negative  Endocrine: Negative  Immunologic: Negative  Musculoskeletal: back pain  Integumentary: Negative  Neurologic: Negative  Psychiatric: Negative  All other ROS: Negative     Past Medical  History:  Anxiety  Arthritis  Atrial flutter  Back pain  GERD - Gastro-esophageal reflux disease  HTN  Hyperlipidemia  Prostate cancer  SOB - Shortness of breath      Social History:  Alcohol  Details: None  Employment/School  Details: Retired  Home/Environment  Details: Lives with Children.; Comment(s): lives with daughter and brother has 7 children  Nutrition/Health  Details: Regular  Substance Abuse  Details: Never  Tobacco  Details: Former smoker      Family History:  Daughter: High blood pressure; Kidney transplant  Daughter: High blood pressure        Allergies:  Allergies (1) Active Reaction  NKA None Documented  ALLERGIES         Home Meds:   Medication List     Active Medications         Documented             acetaminophen: 650 mg, 2 tab(s), PO, q4hr, PRN: Fever/Pain, Mild to               Moderate, 0 Refill(s).               Al hydroxide/Mg hydroxide/simethicone: 30 mL, PO, q4hr, PRN:               Dyspepsia, 0 Refill(s).             ALPRAZolam: PRN: as needed for anxiety, 0 Refill(s).             aspirin: 81 mg, Chewed, Daily, 0 Refill(s).             bicalutamide: 50 mg, 1 tab(s), PO, q24hr, 0 Refill(s).             codeine-guaifenesin: PO, q4hr, 10ml twice a day as needed, 0               Refill(s).             docusate: 100 mg, 1 cap(s), PO, BID, PRN: Constipation, 0 Refill(s).             fentaNYL: 25 mcg, q72hr, change q 3 days fentayl 25 mgPartial fill               upon request, 0 Refill(s).             gabapentin: 300 mg, 1 cap(s), PO, TID, 0 Refill(s).             lisinopril: 5 mg, 1 tab(s), PO, Daily, 0 Refill(s).             loperamide: 2 mg, 1 tab(s), PO, q6hr, as needed, 0 Refill(s).             metoprolol: 25 mg, 1 cap(s), PO, Daily, hold BP less than 100mg, 0               Refill(s).             omeprazole: 20 mg, PO, Daily, 0 Refill(s).             ondansetron: 4 mg, 1 tab(s), PO, q8hr, as needed, 0 Refill(s).             oxyCODONE: 5 mg, 1 tab(s), PO, q6hr, Partial fill upon request, 0                Refill(s).             polyethylene glycol 3350: 17 Gm, PO, Daily, PRN: Constipation, 0               Refill(s).     Medications Inactivated in the Last 72 Hours         fentaNYL: 12 mcg/hr, 1 patch(es), Transderm, q72hr, Partial fill upon           request, 3 patch(es), 0 Refill(s).         metoprolol: 0 Refill(s).         omeprazole: 20 mg, PO, Daily, 0 Refill(s).        Physical Exam:  Vital signs:  per RN assessment  ECOG: 1-2  General: elderly male appearing weak, not in acute distress  Head and neck: normocephalic, no masses  Eyes, Ear and Throat: moist mucous membranes, extraocular muscles intact, anicteric sclera  Cardiovascular: regular rate and rhythm, no murmurs, gallops or rubs  Respiratory: clear to auscultation bilaterally, no wheezes or crackles  Abdomen: soft, nontender, nondistended, no organomegaly  Extremities: warm, perfused, no edema  Back: with a brace      Labs:  07/21/17  PSA 245        Imaging:  07/20/17 MRI L spine  IMPRESSION:   Multilevel bony metastatic   disease which is most prominent in the sacrum and L1  and L5 vertebral bodies.  Acute mild compression fracture in the L2 superior endplate. A pathologic  fracture cannot be excluded.  Suspected small hematoma in the L1-2 and L1 right posterolateral epidural space  causing moderate central spinal stenosis.  Central spinal stenosis secondary to degenerative change which is moderate at  L2-3 and L4-5, mild to moderate at L5-S1, and mild at L3-4.        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     I reviewed his imaging thus far as well as the high PSA which indicates the diagnosis of stage IV prostate cancer.  I recommend staging scans, CT chest/abdomen/pelvis and bone scan.  I agree with starting bicalutamide now. After a few days, he will receive his first dose of Lupron.  I recommend also starting Xgeva for his bony mets. It is a monthly dose, and  I will order that to start next week. Eventually, bicalutamide can be discontinued and patient will be maintained on Lupron alone until progression. Will discuss more at the next visit.     For his pain, he will continue on fentanyl patch and oxycodone as needed.  I encouraged him to not hesitate to use the oxycodone as needed for his breakthrough pain.  I will also refer him to [facility] to see if there is a role for palliative XRT.     Patient is agreeable to plan.  He will return for follow-up in 1 month.  He knows to call with questions/concerns anytime.                    Reviewed and electronically verified by:  Pasqualina Colasurdo MD, Paulina Muchmore                                                                     on:  08/17/2017 17:43Reviewed and electronically authenticated by:  Harnoor Kohles                                                                       on:  08/17/17 17:43

## 2017-08-14 NOTE — Other (Addendum)
[facility] Intake Entered On:  08/14/2017 11:49     Performed On:  08/14/2017 11:35  by Margaretha Glassing RN, [name]               Vital Signs   Pain Scale Used :   0-10   Temperature Oral :   96.6 DegF   Peripheral Pulse Rate :   52 bpm   Respiratory Rate :   16 br/min   Systolic Blood Pressure :   164 mmHg (HI)    Diastolic Blood Pressure :   78 mmHg   Blood Pressure Extremity :   Right arm   Mean Arterial Pressure :   107 mmHg   SpO2 :   98 %   Oxygen Therapy 1 :   Room air   [facility], [name] - 08/14/2017 11:35    Height/Weight   Height :   170 cm(Converted to: 5 foot 7 in, 66.93 in)    Body Surface Area :   1.86 m2   Weight :   75.1 Kg(Converted to: 165 lb 9 oz)    Body Mass Index :   25.99 Kg/m2   Type of Weight :   Standing   Type of Height :   Stated height   Max Fickle, [name] - 08/14/2017 11:35    [facility] General Information/Subjective   Chief Complaint :   new consult prosatae cancer   High Risk for Falls :   Yes   Cancer Fatigue Scale :   9   Cancer Fatigue Score :   9    Preferred Language :   English   Preferred Communication Mode :   Written   Pain Symptoms :   Yes   Max Fickle, [name] - 08/14/2017 11:35    Primary Pain   Primary Pain Location :   Lower back   Pain Scale Used :   0-10   Pain Score :   9    Primary Pain Interventions :   Medications   Max Fickle, [name] - 08/14/2017 11:35    Image 1 -  Images currently included in the form version of this document have not been included in the text rendition version of the form.   [facility] Infectious Disease Risk Screening   Patient Travel History :   No recent travel   Recent Family Travel History :   No recent travel   Max Fickle, [name] - 08/14/2017 11:35    Allergy   (As Of: 08/14/2017 11:49:23 )   Allergies (Active)   NKA  Estimated Onset Date:   Unspecified ; Created By:   Ronnie Doss; Reaction Status:   Active ; Category:   Drug ; Substance:   NKA ; Type:   Allergy ; Updated By:   Ronnie Doss; Reviewed Date:   08/14/2017 11:37         Medication History   Medication List   (As  Of: 08/14/2017 11:49:23 )   Prescription/Discharge Order    fentaNYL  :   fentaNYL ; Status:   Completed ; Ordered As Mnemonic:   fentaNYL 12 mcg/hr transdermal film, extended release ; Simple Display Line:   12 mcg/hr, 1 patch(es), Transderm, q72hr, Partial fill upon request, 3 patch(es), 0 Refill(s) ; Ordering Provider:   Susette Racer MD, Collene Leyden; Catalog Code:   fentaNYL ; Order Dt/Tm:   07/22/2017 08:44:20            Home Meds    metoprolol  :  metoprolol ; Status:   Discontinued ; Ordered As Mnemonic:   metoprolol succinate 100 mg oral capsule, extended release ; Simple Display Line:   0 Refill(s) ; Ordering Provider:   Susette Racer MD, Collene Leyden; Catalog Code:   metoprolol ; Order Dt/Tm:   07/22/2017 08:43:57          docusate  :   docusate ; Status:   Documented ; Ordered As Mnemonic:   docusate sodium 100 mg oral capsule ; Simple Display Line:   100 mg, 1 cap(s), PO, BID, PRN: Constipation, 0 Refill(s) ; Ordering Provider:   Susette Racer MD, Collene Leyden; Catalog Code:   docusate ; Order Dt/Tm:   07/22/2017 08:43:25          polyethylene glycol 3350  :   polyethylene glycol 3350 ; Status:   Documented ; Ordered As Mnemonic:   MiraLax oral powder for reconstitution ; Simple Display Line:   17 Gm, PO, Daily, PRN: Constipation, 0 Refill(s) ; Catalog Code:   polyethylene glycol 3350 ; Order Dt/Tm:   08/14/2017 11:47:11          loperamide  :   loperamide ; Status:   Documented ; Ordered As Mnemonic:   loperamide 2 mg oral tablet ; Simple Display Line:   2 mg, 1 tab(s), PO, q6hr, as needed, 0 Refill(s) ; Catalog Code:   loperamide ; Order Dt/Tm:   08/14/2017 11:38:26          omeprazole  :   omeprazole ; Status:   Discontinued ; Ordered As Mnemonic:   omeprazole ; Simple Display Line:   20 mg, PO, Daily, 0 Refill(s) ; Catalog Code:   omeprazole ; Order Dt/Tm:   10/19/2015 07:42:45          omeprazole  :   omeprazole ; Status:   Documented ; Ordered As Mnemonic:   omeprazole ; Simple Display Line:   20 mg, PO, Daily, 0 Refill(s) ;  Catalog Code:   omeprazole ; Order Dt/Tm:   08/14/2017 11:45:57          fentaNYL  :   fentaNYL ; Status:   Documented ; Ordered As Mnemonic:   fentaNYL ; Simple Display Line:   25 mcg, q72hr, change q 3 days fentayl 25 mgPartial fill upon request, 0 Refill(s) ; Catalog Code:   fentaNYL ; Order Dt/Tm:   08/14/2017 11:39:32          metoprolol  :   metoprolol ; Status:   Documented ; Ordered As Mnemonic:   metoprolol succinate 25 mg oral capsule, extended release ; Simple Display Line:   25 mg, 1 cap(s), PO, Daily, hold BP less than 100mg , 0 Refill(s) ; Catalog Code:   metoprolol ; Order Dt/Tm:   08/14/2017 11:45:01          Al hydroxide/Mg hydroxide/simethicone  :   Al hydroxide/Mg hydroxide/simethicone ; Status:   Documented ; Ordered As Mnemonic:   aluminum hydroxide/magnesium hydroxide/simethicone 200 mg-200 mg-20 mg/5 mL oral suspension ; Simple Display Line:   30 mL, PO, q4hr, PRN: Dyspepsia, 0 Refill(s) ; Ordering Provider:   Susette Racer MD, Collene Leyden; Catalog Code:   Al hydroxide/Mg hydroxide/simethicone ; Order Dt/Tm:   07/22/2017 08:43:18          gabapentin  :   gabapentin ; Status:   Documented ; Ordered As Mnemonic:   gabapentin 300 mg oral capsule ; Simple Display Line:   300 mg, 1 cap(s), PO, TID, 0 Refill(s) ; Catalog Code:  gabapentin ; Order Dt/Tm:   07/12/2017 18:01:00          ondansetron  :   ondansetron ; Status:   Documented ; Ordered As Mnemonic:   Zofran 4 mg oral tablet ; Simple Display Line:   4 mg, 1 tab(s), PO, q8hr, as needed, 0 Refill(s) ; Catalog Code:   ondansetron ; Order Dt/Tm:   08/14/2017 11:46:16          lisinopril  :   lisinopril ; Status:   Documented ; Ordered As Mnemonic:   lisinopril 5 mg oral tablet ; Simple Display Line:   5 mg, 1 tab(s), PO, Daily, 0 Refill(s) ; Ordering Provider:   Susette Racer MD, Collene Leyden; Catalog Code:   lisinopril ; Order Dt/Tm:   07/22/2017 08:43:40          oxyCODONE  :   oxyCODONE ; Status:   Documented ; Ordered As Mnemonic:   oxyCODONE 5 mg oral tablet ;  Simple Display Line:   5 mg, 1 tab(s), PO, q6hr, Partial fill upon request, 0 Refill(s) ; Catalog Code:   oxyCODONE ; Order Dt/Tm:   08/14/2017 11:46:48          bicalutamide  :   bicalutamide ; Status:   Documented ; Ordered As Mnemonic:   bicalutamide 50 mg oral tablet ; Simple Display Line:   50 mg, 1 tab(s), PO, q24hr, 0 Refill(s) ; Catalog Code:   bicalutamide ; Order Dt/Tm:   08/14/2017 11:37:59          acetaminophen  :   acetaminophen ; Status:   Documented ; Ordered As Mnemonic:   acetaminophen 325 mg oral tablet ; Simple Display Line:   650 mg, 2 tab(s), PO, q4hr, PRN: Fever/Pain, Mild to Moderate, 0 Refill(s) ; Ordering Provider:   Susette Racer MD, Collene Leyden; Catalog Code:   acetaminophen ; Order Dt/Tm:   07/22/2017 08:43:08          aspirin  :   aspirin ; Status:   Documented ; Ordered As Mnemonic:   aspirin ; Simple Display Line:   81 mg, Chewed, Daily, 0 Refill(s) ; Catalog Code:   aspirin ; Order Dt/Tm:   07/18/2017 08:40:53          codeine-guaifenesin  :   codeine-guaifenesin ; Status:   Documented ; Ordered As Mnemonic:   Robitussin AC ; Simple Display Line:   PO, q4hr, 10ml twice a day as needed, 0 Refill(s) ; Catalog Code:   codeine-guaifenesin ; Order Dt/Tm:   08/14/2017 11:47:36          ALPRAZolam  :   ALPRAZolam ; Status:   Documented ; Ordered As Mnemonic:   Xanax ; Simple Display Line:   PRN: as needed for anxiety, 0 Refill(s) ; Catalog Code:   ALPRAZolam ; Order Dt/Tm:   09/07/2015 07:07:23            [facility] Social History   Cigarette Smoking Last 365 Days :   No   Exposure to Tobacco Smoke :   Former smoker   Patient used other tobacco products in the last 30 days? :   No   Max Fickle, [name] - 08/14/2017 11:35    Social History   (As Of: 08/14/2017 11:49:23 )   Tobacco:        Former smoker   (Last Updated: 10/19/2015 07:51:16  by Konrad Penta RN, Johnny Bridge)          Alcohol:  None   (Last Updated: 10/19/2015 07:51:23  by Konrad Penta RN, Johnny Bridge)          Employment/School:        Retired   (Last Updated:  08/14/2017 11:40:49  by Margaretha Glassing RN, [name])          Nutrition/Health:        Regular   (Last Updated: 08/14/2017 11:41:02  by Margaretha Glassing RN, [name])          Substance Abuse:        Never   (Last Updated: 10/19/2015 07:51:50  by Konrad Penta RN, Johnny Bridge)          Home/Environment:        Lives with Children.   Comments:  08/14/2017 11:49 - Margaretha Glassing RN, [name]: lives with daughter and brother has 7 children   (Last Updated: 08/14/2017 11:49:19  by Margaretha Glassing RN, [name])            Advance Directive   Advanced Directives :   Yes   (Comment: family to bring in [place] RN, [name] - 08/14/2017 11:50 ] )       Margaretha Glassing RN, [name] - 08/14/2017 11:50      [facility] ON[facility] Nutrition   Weight Change > 10lbs in 6 Months :   No   Home Diet :   Regular   Appetite :   Marchia Meiers RN, [name] - 08/14/2017 11:35    Nutritional Risk Factors   Nausea/Vomiting/Diarrhea X 3 Days :   Yes   (Comment: diarrhea Margaretha Glassing RN, [name] - 08/14/2017 11:35 ] )   Margaretha Glassing RN, [name] - 08/14/2017 11:35    [facility] Encounter   Onc Type of Patient :   Outpatient Visit Existing   Onc Visit Level, Existing :   Level 2   Max Fickle, [name] - 08/14/2017 11:35

## 2017-08-17 ENCOUNTER — Ambulatory Visit: Admitting: Family

## 2017-08-17 LAB — HX CBC W/ DIFF
CASE NUMBER: 2019161001065
HX ABSOLUTE NRBC COUNT: 0 10*3/uL
HX HCT: 37.7 % — ABNORMAL LOW (ref 39.0–53.0)
HX HGB: 12.4 g/dL — ABNORMAL LOW (ref 13.0–17.5)
HX MCH: 33.4 pg — NL (ref 26.0–34.0)
HX MCHC: 32.9 g/dL — NL (ref 31.0–37.0)
HX MCV: 101.6 fL — ABNORMAL HIGH (ref 80.0–100.0)
HX MPV: 10.3 fL — NL (ref 9.4–12.4)
HX NRBC PERCENT: 0 % — NL
HX PLATELET: 176 10*3/uL — NL (ref 150.0–400.0)
HX RBC: 3.71 10*6/uL — ABNORMAL LOW (ref 4.2–5.9)
HX RDW-CV: 13.6 % — NL (ref 11.5–14.5)
HX RDW-SD: 50.9 fL — NL (ref 35.0–51.0)
HX WBC: 6.1 10*3/uL — NL (ref 4.0–11.0)

## 2017-08-17 LAB — HX PSC EKG LAB: CASE NUMBER: 2019161001065

## 2017-08-17 LAB — HX COMPREHENSIVE METABOLIC PANEL
CASE NUMBER: 2019161001065
HX ALBUMIN LVL: 3.4 g/dL — NL (ref 3.2–5.0)
HX ALKALINE PHOSPHATASE: 637 U/L — ABNORMAL HIGH (ref 30.0–117.0)
HX ALT: 18 U/L — NL (ref 6.0–55.0)
HX ANION GAP: 7 — NL (ref 3.0–11.0)
HX AST: 16 U/L — NL (ref 6.0–40.0)
HX BILIRUBIN TOTAL: 0.5 mg/dL — NL (ref 0.2–1.2)
HX BUN: 24 mg/dL — ABNORMAL HIGH (ref 8.0–23.0)
HX CALCIUM LVL: 9.5 mg/dL — NL (ref 8.5–10.5)
HX CHLORIDE: 105 mmol/L — NL (ref 98.0–110.0)
HX CO2: 27 mmol/L — NL (ref 21.0–32.0)
HX CREATININE: 1.82 mg/dL — ABNORMAL HIGH (ref 0.55–1.3)
HX GLUCOSE LVL: 120 mg/dL — ABNORMAL HIGH (ref 70.0–110.0)
HX POTASSIUM LVL: 4.3 mmol/L — NL (ref 3.6–5.2)
HX SODIUM LVL: 139 mmol/L — NL (ref 136.0–146.0)
HX TOTAL PROTEIN: 6.5 g/dL — NL (ref 6.0–8.4)

## 2017-08-17 LAB — HX .AUTOMATED DIFF
CASE NUMBER: 2019161001065
HX ABSOLUTE BASO COUNT: 0.04 10*3/uL — NL (ref 0.0–0.22)
HX ABSOLUTE EOS COUNT: 0.31 10*3/uL — NL (ref 0.0–0.45)
HX ABSOLUTE LYMPHS COUNT: 1.4 10*3/uL — NL (ref 0.74–5.04)
HX ABSOLUTE MONO COUNT: 0.61 10*3/uL — NL (ref 0.0–1.34)
HX ABSOLUTE NEUTRO COUNT: 3.71 10*3/uL — NL (ref 1.48–7.95)
HX BASOPHILS: 0.7 %
HX EOSINOPHILS: 5.1 %
HX IMMATURE GRANULOCYTES: 0.2 % — NL (ref 0.0–2.0)
HX LYMPHOCYTES: 23 %
HX MONOCYTES: 10 %
HX NEUTROPHILS: 61 %

## 2017-08-17 LAB — HX IRON PROFILE
CASE NUMBER: 2019161001065
HX % IRON BOUND: 29 % — NL (ref 10.0–50.0)
HX FERRITIN LVL: 310 ng/mL — NL (ref 24.0–336.0)
HX IRON: 71 ug/dL — NL (ref 40.0–175.0)
HX TIBC: 242 ug/dL — ABNORMAL LOW (ref 250.0–450.0)

## 2017-08-17 LAB — HX GLOMERULAR FILTRATION RATE (ESTIMATED)
CASE NUMBER: 2019161001065
HX AFN AMER GLOMERULAR FILTRATION RATE: 39 mL/min/{1.73_m2}
HX NON-AFN AMER GLOMERULAR FILTRATION RATE: 33 mL/min/{1.73_m2}

## 2017-08-17 LAB — HX TSH
CASE NUMBER: 2019161001065
HX 3RD GEN TSH: 2.18 u[IU]/mL — NL (ref 0.358–3.74)

## 2017-08-17 LAB — HX VITAMIN B12 LEVEL
CASE NUMBER: 2019161001065
HX VITAMIN B12 LVL: 223 pg/mL — NL (ref 193.0–986.0)

## 2017-08-17 LAB — HX MAGNESIUM LEVEL
CASE NUMBER: 2019161001065
HX MAGNESIUM LVL: 2.2 mg/dL — NL (ref 1.7–2.5)

## 2017-08-26 ENCOUNTER — Ambulatory Visit: Admitting: Radiation Oncology

## 2017-08-26 ENCOUNTER — Ambulatory Visit

## 2017-08-26 NOTE — Consults (Signed)
cc:  Micheline Maze [name]  Dianah Field [name]  Leola Brazil [name]    __________________________________________________________________________    RADIATION ONCOLOGY CONSULTATION  DATE:  08/26/2017    DIAGNOSIS:  Prostate cancer with bone metastases.    REQUESTING PHYSICIAN:  Micheline Maze, [name]    CHIEF COMPLAINT:  Back pain and evidence of multiple bony metastases.    HISTORY OF PRESENT ILLNESS:  The patient is an 84 year old [name] with history of  known past prostate cancer treated with radiation therapy in 2006 approximately  who recently had a fall and increased back pain leading to findings suggesting  metastatic prostate cancer.  I am seeing the patient in consultation at the  request of Dr. Kem Kays to discuss treatment options and the potential role for  palliative radiation therapy.    The patient was seen today with his two daughters.  He is originally from the  [place] area but now spends much of his time in [place].  He does  come up here for the summertime however, and although he has had some  longstanding low back pain, when moving back up here recently he had a fall down  13 to 15 stairs resulting in an acute increase in his pain.  He came to the  Emergency Department 07/18/2017.  He was then evaluated and treated  symptomatically for severe pain with MRI of the spine performed 07/20/2017  showing L1 inferior endplate hyperintensity suggesting a small hematoma,  adjacent to multilevel metastases within the lumbosacral spine.  There was no  definite evidence of any epidural extension, but it could not be ruled out as he  had a noncontrast MRI.  L2 had an acute mild compression fracture at the  superior endplate, but no major lytic lesions, or other findings were  identified.    He met with Dr. Lynann Bologna and at the time of his hospitalization PSA was quite  elevated at 245 ng/ml.  Dr. Kem Kays recommended considering systemic therapy and  Xgeva with possible radiation therapy.  The patient  has started bicalutamide and  is scheduled to receive leuprolide this Friday with Dr. [name], but he declined  to undergo CT scans and bone scan scheduled earlier today by Dr. Kem Kays.  He is  iffy about his decision to stay in this area and may want to move back to [place].    Currently, he has some urinary obstructive symptoms that are mild but no recent  new change and denies dysuria, hematuria, dyschezia, hematochezia.  He has had  constipation on narcotic analgesics recently but did recently have a bowel  movement.  He denies any numbness, paresthesias and no radiating pain from the  back, which currently is 6/10 severity with no exacerbating or alleviating  symptoms.  He did not really discuss much any of his appetite or other symptoms  and did not wish to review things in much detail otherwise.  His focus was  partly on his ability to return to [place] which he considers home.  One  of his daughters, however, wants to be involved directly in his care and is  concerned about his returning to [place].    PAST MEDICAL AND SURGICAL HISTORY:  Prostate cancer as described above, with no  radiation since his prostate radiotherapy in 2006.  Records have been requested.    CURRENT MEDICATIONS:  1. Fentanyl 25 mcg every 72 hours.  2. Aspirin 81 mg daily.  3. Acetaminophen 325 two tablets every four hours  as needed.  4. Oxycodone 5 mg every 6 hours as needed for breakthrough pain.  5. Polyethylene glycol.  6. Omeprazole.  7. Ondansetron as needed.  8. Loperamide.  9. Lisinopril.  10. Gabapentin.  11. Docusate.  12. Robitussin-AC.  13. Bicalutamide Tyler mg daily.  14. Not yet starting leuprolide.    ALLERGIES:  No known drug allergies.    FAMILY HISTORY:  Noncontributory.    SOCIAL HISTORY:  The patient is retired, currently living in [place].  He  is widowed but does have a significant other living down there.  He has two  daughters who are here to support him but it does seem like there is  some  disagreement over where he should remain and be treated.  He is an ex-smoker, no  current alcohol or tobacco use.    REVIEW OF SYSTEMS:  All other systems were reviewed and otherwise negative.    PHYSICAL EXAMINATION:  GENERAL:  Pleasant Caucasian [name], sitting in a  wheelchair in no acute distress.  Flat affect, appropriate behavior in  discussion today.  VITAL SIGNS:  KPS 60%, ECOG 2. Temperature 96.4 F.  Heart  rate 51.  Respiratory rate 16.  Blood pressure 193/76.  SaO2 96% on room air.   Weight 76 kilograms.  SKIN:  No cyanosis, jaundice, rash or pallor.  HEENT:   Normocephalic, atraumatic.  Sclerae anicteric.  NECK:  Supple.  ABDOMEN:   Benign, no hepatosplenomegaly.  RECTAL:  Deferred by examiner.  MUSCULOSKELETAL:   He does have pain level of L2, but no tenderness on examination with palpation  or reproducible symptoms with mild fist percussion.  No palpable paraspinal  masses.  EXTREMITIES:  No clubbing, cyanosis or edema.  NEUROLOGIC:  Alert and  oriented x3.  Speech is fluent.  Motor strength 5/5 both lower extremities.   Sensation to light touch is symmetric.  Ambulation not tested.    DATA REVIEW:  I reviewed the patient's available imaging from recently.  Also in  reviewing some of his old records he did have at least one episode of hematuria  approximately a year ago.  PSA on 07/21/2017 was 245.0 ng/ml.  No recent biopsy  performed.  Pathology from [facility] are not available currently nor  are his radiation records.    IMPRESSION:  An 84 year old [name] with prostate cancer treated over a decade ago,  now with evidence of bony metastases and an elevated PSA corroborating  metastatic disease from prostate cancer.  He has started hormone therapy with  bicalutamide and will receive his leuprolide injection this Friday.  Overall,  that is the prime treatment he would benefit from in terms of any individual  treatment.  He seems to have a strong desire to avoid unnecessary costs and  has  canceled his imaging earlier.  Of all of his planned treatments or evaluations,  the leuprolide is probably the most important, along with addressing his pain  for pain relief.  I will notify Dr. Kem Kays that he has canceled his imaging  studies but will defer the decision to reschedule them to her.    Regarding pain relief, radiation can often improve it, but with the focal change  at L2 he may also benefit from kyphoplasty.  In this setting, if the L2  compression fracture contributes to his acute pain then it may be relieved  quicker as a mechanical issue with kyphoplasty and cement than with external  beam radiotherapy which will have no effect  on the mechanics or alignment of the  spine, only on disease within the vertebral bodies.    I explained to him the rationale for external beam radiation therapy to spine.   It may still be an option to consider, even after kyphoplasty to prevent further  progression or to help with incomplete pain relief.  However, I cannot start any  radiation therapy until we have his prior treatment records from [place].  They  should be archived and we will see if we can retrieve them in the next week or  so.    The patient still indicated a desire to get back to [place].  I reassured  him that radiation, if necessary, could be a short course of treatment this time  unlike his initial prostate radiotherapy.  He then seemed willing to consider  returning to consider his palliative radiotherapy here.  He does clearly  understand that all treatment options we are offering are palliative.  His  definition of "getting back to my life" does not mean cure as much as  improvement in functional status to be active the way he used to be.  That is a  reasonable goal, and we will start with a referral to Interventional Radiology  for possible kyphoplasty and I can see him back to see if radiation would be of  any further help in achieving that goal.    I spent more than Tyler% of the  45-minute consultation in counseling and  coordinating care with his other physicians.  All of his questions were  answered.  His daughter seemed less satisfied in terms of the discussion with  her concern being he will return to [place].  However, she also agreed  with the plan outlined as a way to try to help him with his current pain.    RECOMMENDATIONS:  1. I recommend considering kyphoplasty for acute pain relief L2 vertebral body  reviewed already with Interventional Radiology and screened so hopefully he  should be able to be seen soon.    2. Consider potential palliative radiation therapy to the spine, but first we  will need his records from [place].  Those have now been requested for review.  I  can see him for reevaluation after his kyphoplasty.    3. PAIN:  Defer to other management in Medical Oncology for the time being.      4. BONE HEALTH:  Currently he has been advised to take denosumab.  It is unclear  if he will do so but I will defer to Dr. Kem Kays for discussing the  risks/benefits.    5. No active hematuria or dyschezia as he had one year ago, can monitor  conservatively.    Thank you for allowing me to participate in the care of this very pleasant  gentleman.  Don't hesitate to call if I can help in any other way.    Dictated by:  Kaylyn Layer, [name]    D:  08/26/2017 15:00:30  T:  08/27/2017 10:58:11  E:  08/27/2017 10:58:11  MK/mes  Job# [id number]    Reviewed and electronically verified by:  Myrtis Ser MD, Molli Hazard  on:  09/08/2017 07:52    Reviewed and electronically authenticated by:  Kaylyn Layer                                                                         on:  09/08/17 07:52

## 2017-08-26 NOTE — Initial Assessments (Addendum)
Radiation Oncology Initial Assessment Entered On:  08/26/2017 14:02     Performed On:  08/26/2017 13:51  by Rockne Coons               Vitals/Height/Weight   Temperature Temporal :   96.4 DegF   Apical Heart Rate :   51 bpm   Respiratory Rate :   16 br/min   Systolic Blood Pressure :   193 mmHg (>HHI)    Diastolic Blood Pressure :   76 mmHg   Mean Arterial Pressure :   115 mmHg   Oxygen Saturation :   96 %   Oxygen Therapy 1 :   Room air   Height :   170 cm(Converted to: 5 foot 7 in, 66.93 in)    Body Surface Area :   1.87 m2   Weight :   76 Kg(Converted to: 167 lb 9 oz)    Body Mass Index :   26.3 Kg/m2   Type of Weight :   Standing   Type of Height :   Stated height   Rockne Coons - 08/26/2017 13:51    General Information   Chief Complaint :   consult   Preferred Language :   English   Preferred Communication Mode :   Verbal   High Risk for Falls :   Yes   Cancer Fatigue Scale :   5   Cancer Fatigue Score :   5    Performance Status :   Bedridden, <50% of day   Karnofsky Performance Scale :   50% - Requires considerable assistance   Karnofsky Score :   50 %   Rockne Coons - 08/26/2017 13:51    Infectious Disease Risk Screening   Patient Travel History :   No recent travel   Recent Family Travel History :   No recent travel   Rockne Coons - 08/26/2017 13:51    Allergy   (As Of: 08/26/2017 14:02:47 )   Allergies (Active)   NKA  Estimated Onset Date:   Unspecified ; Created By:   Ronnie Doss; Reaction Status:   Active ; Category:   Drug ; Substance:   NKA ; Type:   Allergy ; Updated By:   Ronnie Doss; Reviewed Date:   08/26/2017 13:59         Medication History   Medication List   (As Of: 08/26/2017 14:02:47 )   Home Meds    codeine-guaifenesin  :   codeine-guaifenesin ; Status:   Documented ; Ordered As Mnemonic:   Robitussin AC ; Simple Display Line:   PO, q4hr, 10ml twice a day as needed, 0 Refill(s) ; Catalog Code:   codeine-guaifenesin ; Order Dt/Tm:   08/14/2017 11:47:36          polyethylene glycol  3350  :   polyethylene glycol 3350 ; Status:   Documented ; Ordered As Mnemonic:   MiraLax oral powder for reconstitution ; Simple Display Line:   17 Gm, PO, Daily, PRN: Constipation, 0 Refill(s) ; Catalog Code:   polyethylene glycol 3350 ; Order Dt/Tm:   08/14/2017 11:47:11          ondansetron  :   ondansetron ; Status:   Documented ; Ordered As Mnemonic:   Zofran 4 mg oral tablet ; Simple Display Line:   4 mg, 1 tab(s), PO, q8hr, as needed, 0 Refill(s) ; Catalog Code:   ondansetron ; Order Dt/Tm:   08/14/2017 11:46:16  oxyCODONE  :   oxyCODONE ; Status:   Documented ; Ordered As Mnemonic:   oxyCODONE 5 mg oral tablet ; Simple Display Line:   5 mg, 1 tab(s), PO, q6hr, Partial fill upon request, 0 Refill(s) ; Catalog Code:   oxyCODONE ; Order Dt/Tm:   08/14/2017 11:46:48          metoprolol  :   metoprolol ; Status:   Documented ; Ordered As Mnemonic:   metoprolol succinate 25 mg oral capsule, extended release ; Simple Display Line:   25 mg, 1 cap(s), PO, Daily, hold BP less than 100mg , 0 Refill(s) ; Catalog Code:   metoprolol ; Order Dt/Tm:   08/14/2017 11:45:01          omeprazole  :   omeprazole ; Status:   Documented ; Ordered As Mnemonic:   omeprazole ; Simple Display Line:   20 mg, PO, Daily, 0 Refill(s) ; Catalog Code:   omeprazole ; Order Dt/Tm:   08/14/2017 11:45:57          fentaNYL  :   fentaNYL ; Status:   Documented ; Ordered As Mnemonic:   fentaNYL ; Simple Display Line:   25 mcg, q72hr, change q 3 days fentayl 25 mgPartial fill upon request, 0 Refill(s) ; Catalog Code:   fentaNYL ; Order Dt/Tm:   08/14/2017 11:39:32          loperamide  :   loperamide ; Status:   Documented ; Ordered As Mnemonic:   loperamide 2 mg oral tablet ; Simple Display Line:   2 mg, 1 tab(s), PO, q6hr, as needed, 0 Refill(s) ; Catalog Code:   loperamide ; Order Dt/Tm:   08/14/2017 11:38:26          bicalutamide  :   bicalutamide ; Status:   Documented ; Ordered As Mnemonic:   bicalutamide 50 mg oral tablet ; Simple  Display Line:   50 mg, 1 tab(s), PO, q24hr, 0 Refill(s) ; Catalog Code:   bicalutamide ; Order Dt/Tm:   08/14/2017 11:37:59          acetaminophen  :   acetaminophen ; Status:   Documented ; Ordered As Mnemonic:   acetaminophen 325 mg oral tablet ; Simple Display Line:   650 mg, 2 tab(s), PO, q4hr, PRN: Fever/Pain, Mild to Moderate, 0 Refill(s) ; Ordering Provider:   Susette Racer MD, Collene Leyden; Catalog Code:   acetaminophen ; Order Dt/Tm:   07/22/2017 08:43:08          Al hydroxide/Mg hydroxide/simethicone  :   Al hydroxide/Mg hydroxide/simethicone ; Status:   Documented ; Ordered As Mnemonic:   aluminum hydroxide/magnesium hydroxide/simethicone 200 mg-200 mg-20 mg/5 mL oral suspension ; Simple Display Line:   30 mL, PO, q4hr, PRN: Dyspepsia, 0 Refill(s) ; Ordering Provider:   Susette Racer MD, Collene Leyden; Catalog Code:   Al hydroxide/Mg hydroxide/simethicone ; Order Dt/Tm:   07/22/2017 08:43:18          docusate  :   docusate ; Status:   Documented ; Ordered As Mnemonic:   docusate sodium 100 mg oral capsule ; Simple Display Line:   100 mg, 1 cap(s), PO, BID, PRN: Constipation, 0 Refill(s) ; Ordering Provider:   Susette Racer MD, Collene Leyden; Catalog Code:   docusate ; Order Dt/Tm:   07/22/2017 08:43:25          lisinopril  :   lisinopril ; Status:   Documented ; Ordered As Mnemonic:   lisinopril 5 mg oral tablet ;  Simple Display Line:   5 mg, 1 tab(s), PO, Daily, 0 Refill(s) ; Ordering Provider:   Susette Racer MD, Collene Leyden; Catalog Code:   lisinopril ; Order Dt/Tm:   07/22/2017 08:43:40          aspirin  :   aspirin ; Status:   Documented ; Ordered As Mnemonic:   aspirin ; Simple Display Line:   81 mg, Chewed, Daily, 0 Refill(s) ; Catalog Code:   aspirin ; Order Dt/Tm:   07/18/2017 08:40:53          gabapentin  :   gabapentin ; Status:   Documented ; Ordered As Mnemonic:   gabapentin 300 mg oral capsule ; Simple Display Line:   300 mg, 1 cap(s), PO, TID, 0 Refill(s) ; Catalog Code:   gabapentin ; Order Dt/Tm:   07/12/2017 18:01:00           ALPRAZolam  :   ALPRAZolam ; Status:   Documented ; Ordered As Mnemonic:   Xanax ; Simple Display Line:   PRN: as needed for anxiety, 0 Refill(s) ; Catalog Code:   ALPRAZolam ; Order Dt/Tm:   09/07/2015 07:07:23            Subjective   Pain Symptoms :   Yes   Rockne Coons - 08/26/2017 13:51    Primary Pain   Primary Pain Location :   Lower back   Pain Scale Used :   0-10   Pain Score :   6    Primary Pain Interventions :   Medications   Rockne Coons - 08/26/2017 13:51    Image 1 -  Images currently included in the form version of this document have not been included in the text rendition version of the form.   Advance Directive   Advanced Directives :   Yes   Advance Directive Type :   Health Care Proxy   Rockne Coons - 08/26/2017 13:51    Problem List / Family History   (As Of: 08/26/2017 14:02:47 )   Problems(Active)    Anxiety (SNOMED CT  :0865784696 )  Name of Problem:   Anxiety ; Recorder:   Plumb RN, Johnny Bridge; Confirmation:   Confirmed ; Classification:   Patient Stated ; Code:   [id number] ; Contributor System:   Dietitian ; Last Updated:   10/19/2015 07:49  ; Life Cycle Date:   10/19/2015 ; Life Cycle Status:   Active ; Vocabulary:   SNOMED CT        Arthritis (SNOMED CT  :4010272536 )  Name of Problem:   Arthritis ; Recorder:   Plumb RN, Johnny Bridge; Confirmation:   Confirmed ; Classification:   Patient Stated ; Code:   [id number] ; Contributor System:   Dietitian ; Last Updated:   10/19/2015 07:49  ; Life Cycle Date:   10/19/2015 ; Life Cycle Status:   Active ; Vocabulary:   SNOMED CT        Atrial flutter (SNOMED CT  :9563875 )  Name of Problem:   Atrial flutter ; Recorder:   Plumb RN, Johnny Bridge; Confirmation:   Confirmed ; Classification:   Patient Stated ; Code:   [id number] ; Contributor System:   Dietitian ; Last Updated:   10/19/2015 07:47  ; Life Cycle Date:   10/19/2015 ; Life Cycle Status:   Active ; Vocabulary:   SNOMED CT        Back pain (SNOMED CT  :188416606 )  Name of Problem:   Back pain ;  Recorder:   Plumb RN, Johnny Bridge; Confirmation:   Confirmed ; Classification:   Patient Stated ; Code:   [id number] ; Contributor System:   Dietitian ; Last Updated:   10/19/2015 07:49  ; Life Cycle Date:   10/19/2015 ; Life Cycle Status:   Active ; Vocabulary:   SNOMED CT        GERD - Gastro-esophageal reflux disease (SNOMED CT  :4098119147 )  Name of Problem:   GERD - Gastro-esophageal reflux disease ; Recorder:   Plumb RN, Johnny Bridge; Confirmation:   Confirmed ; Classification:   Patient Stated ; Code:   [id number] ; Contributor System:   PowerChart ; Last Updated:   10/19/2015 07:48  ; Life Cycle Date:   10/19/2015 ; Life Cycle Status:   Active ; Vocabulary:   SNOMED CT        HTN (SNOMED CT  :6578469629 )  Name of Problem:   HTN ; Recorder:   Ronnie Doss; Confirmation:   Confirmed ; Classification:   Patient Stated ; Code:   [id number] ; Contributor System:   Dietitian ; Last Updated:   09/07/2015 05:54  ; Life Cycle Date:   09/07/2015 ; Life Cycle Status:   Active ; Vocabulary:   SNOMED CT        Hyperlipidemia (SNOMED CT  :10272536 )  Name of Problem:   Hyperlipidemia ; Recorder:   Plumb RN, Johnny Bridge; Confirmation:   Confirmed ; Classification:   Patient Stated ; Code:   [id number] ; Contributor System:   PowerChart ; Last Updated:   10/19/2015 07:48  ; Life Cycle Date:   10/19/2015 ; Life Cycle Status:   Active ; Vocabulary:   SNOMED CT        Prostate cancer (SNOMED CT  :2595638756 )  Name of Problem:   Prostate cancer ; Recorder:   Plumb RN, Johnny Bridge; Confirmation:   Confirmed ; Classification:   Patient Stated ; Code:   [id number] ; Contributor System:   PowerChart ; Last Updated:   10/19/2015 07:50  ; Life Cycle Date:   10/19/2015 ; Life Cycle Status:   Active ; Vocabulary:   SNOMED CT        SOB - Shortness of breath (SNOMED CT  :166063016 )  Name of Problem:   SOB - Shortness of breath ; Recorder:   Plumb RN, Johnny Bridge; Confirmation:   Confirmed ; Classification:   Patient Stated ; Code:   [id number] ; Contributor  System:   Dietitian ; Last Updated:   10/19/2015 08:02  ; Life Cycle Date:   10/19/2015 ; Life Cycle Status:   Active ; Vocabulary:   SNOMED CT          Family History   (As Of: 08/26/2017 14:02:47 )   Daughter:   Relation:   Daughter ; Gender:   Male ;           Nomenclature:   Kidney transplant ; Value:   Positive            Nomenclature:   High blood pressure ; Value:   Positive          Daughter:   Relation:   Daughter ; Gender:   Male ;           Nomenclature:   High blood pressure ; Value:   Positive            Procedure History        -    Procedure History   (As  Of: 08/26/2017 14:02:47 )     Anesthesia Minutes:   0 ; Procedure Name:   repair wrist laceration ; Procedure Minutes:   0 ; Last Reviewed Dt/Tm:   08/26/2017 14:02:01             Procedure Dt/Tm:   08/14/2004 ; Anesthesia Minutes:   0 ; Procedure Name:   eadiation therapy ; Procedure Minutes:   0 ; Last Reviewed Dt/Tm:   08/26/2017 14:02:01             Procedure Dt/Tm:   08/14/2004 ; Anesthesia Minutes:   0 ; Procedure Name:   prosstate cancer ; Procedure Minutes:   0 ; Last Reviewed Dt/Tm:   08/26/2017 14:02:01             Social History   Current Smoking Status :   Former smoker   Did the patient smoke (past 12 months) :   No tobacco smoking   Patient used other tobacco products in the last 30 days? :   No   Rockne Coons - 08/26/2017 13:51    Social History   (As Of: 08/26/2017 14:02:47 )   Tobacco:        Former smoker   (Last Updated: 10/19/2015 07:51:16  by Konrad Penta RN, Johnny Bridge)          Alcohol:        None   (Last Updated: 10/19/2015 07:51:23  by Konrad Penta RN, Johnny Bridge)          Employment/School:        Retired   (Last Updated: 08/14/2017 11:40:49  by Margaretha Glassing RN, [name])          Nutrition/Health:        Regular   (Last Updated: 08/14/2017 11:41:02  by Margaretha Glassing RN, [name])          Substance Abuse:        Never   (Last Updated: 10/19/2015 07:51:50  by Konrad Penta RN, Johnny Bridge)          Home/Environment:        Lives with Children.   Comments:  08/14/2017 11:49 -  Margaretha Glassing RN, [name]: lives with daughter and brother has 7 children   (Last Updated: 08/14/2017 11:49:19  by Margaretha Glassing RN, [name])            ONC Nutrition   MST Patient Able to Complete Assessment :   Yes   MST How Much Weight Lost :   2-13 lb   MST Eating Poorly Due to Decreased Appetite :   Yes   MST Score :   2    MST Lose Weight Without Trying :   Yes   Perception of Body Size :   Too fat   Appetite :   Deborha Payment - 08/26/2017 13:51

## 2017-08-28 ENCOUNTER — Ambulatory Visit: Admitting: Urology

## 2017-08-28 NOTE — Progress Notes (Signed)
* * *        **[name]    --- ---    76 Y old Male, DOB: 02-Oct-1931    Account Number: [id number]    [address] , [place], [place]    Home: [phone]    Guarantor: [name], Jos Insurance: [name] Medicare Complete Payer ID:  [id number]    PCP: [name], MD    Appointment Facility: [facility], [facility].        * * *    08/28/2017  Progress Notes: [name], [name]    --- ---    ---         **Current Medications**    ---    Taking     * Omeprazole 20 MG Capsule Delayed Release 1 capsule Orally Once a day    ---    * Acetaminophen 500 MG Capsule 1 capsule as needed Orally every 6 hrs    ---    * Fentanyl 50 MCG/HR Patch 72 Hour 1 patch to skin Transdermal     ---    * Gabapentin 300 MG Capsule 1 capsule Orally Once a day    ---    * Lisinopril 5 MG Tablet 1 tablet Orally Once a day    ---    * Metoprolol Succinate ER 25 MG Tablet Extended Release 24 Hour 1 tablet Orally Once a day    ---    * MiraLax - Powder as directed Orally     ---    * Oxycodone HCl 5 MG/5ML Solution 5 ml as needed Orally every 6 hrs    ---    * Milk of Magnesia 400 MG/5ML Suspension 5 ml as needed Orally Four times a day    ---    * Bisacodyl 10 MG Suppository 1 suppository as needed Rectal Once a day    ---    * Aspirin 81 MG Tablet Delayed Release 1 tablet Orally Once a day    ---    * Casodex 50 MG Tablet 1 tablet Orally Once a day    ---    Not-Taking/PRN    * Centrum Silver Tablet as directed Orally     ---    * Hydrochlorothiazide 25 MG Tablet 1 tablet Orally Once a day    ---    * Xanax 0.25 MG Tablet 1 tablet Orally Twice a day    ---    * Hydrocodone-Acetaminophen 5-325 MG Tablet 1 tablet as needed Orally every 6 hrs    ---    * Amiodarone HCl 200 MG Tablet 1 tablet Orally Once a day    ---    * Zestril 5 MG Tablet 1 tablet Orally Once a day    ---    * Zocor 10 MG Tablet 1 tablet every evening Orally Once a day    ---    * Xarelto 15 MG Tablet Orally     ---    * Medication List  reviewed and reconciled with the patient    ---      Past Medical History    ---       prostate cancer: Dx'd 7/06; S/P XRT: 11/06-1/07: gleason's 8.        ---    GERD.        ---    Hypercholesterolemia.        ---    Hypertension.        ---  Anxiety.        ---    Atrial Fibrillation: @ 2017.        ---    ED - Impotence of organic origin.        ---    Renal insufficiency.        ---       **Surgical History**    ---       Trans Rectal [place] and Prostate BX 09/2004    ---    cholecystectomy 04/2004    ---    inguinal hernia repair    ---    cystoscopy: c/w rad changes 08/27/2016    ---    cardioversion    ---      **Family History**    ---       No Family History documented.    ---       **Social History**    ---    Alcohol: yes, occasional.    no Smoking status: never smoker, Patient counseled on the dangers of tobacco  use: 08/20/2016.    no Caffeine.      **Allergies**    ---       N.K.D.A.    ---       **Hospitalization/Major Diagnostic Procedure**    ---       fall/compressionfx c/we met disease 5/11-5/15/19    ---         **Reason for Appointment**    ---       1\. First lupron shot    ---    2\. Prostate cancer    ---       **History of Present Illness**    ---     _Urological History_ :    patient is here for 1st Lupron injection.       **Vital Signs**    ---    Ht 5'7", Wt 160, BMI 25.06, BP 130/62, Temp 97.8, RR 16.       **Examination**    ---     _General_ :    General appearance:  A&O x 3, no acute distress, pleasant. Lungs/Respirations:  No labored breathing. general  **alert, oriented**.          **Assessments**    ---    1\. Prostate cancer - C61 (Primary)    ---       **Treatment**    ---       **1\. Prostate cancer**    Notes: Patient came in today for 1st Lupron injection. Went over what to  expect at injection site and if area is tender, sore, apply warm compress to  help relieve discomfort. Most common side effects are hot flashes/night  sweats. Gave pt Lupron insert to look over other s/s and  report anything  unusal or concerning. Services provided by Karoline Caldwell RN Patient  Educated with: www.uptodate.com ([email or web address].com).    ---      **Therapeutic Injections**    ---       Lupron : 22.5 mg (Dose No:1) (Route: Intramuscular) given by Karoline Caldwell on right gluteus (Prostate cancer)    ---       **Procedure Codes**    ---       Z6109 LUPRON    ---       **Follow Up**    ---    next appt 12/04/17 with MD    Electronically signed by Leola Brazil , MD on 08/30/2017 at 09:15 PM EDT    Sign  off status: Completed        * * [facility] [address] [facility]    [address]    [place], [place] [id number]    Tel: [phone]    Fax: [phone]              * * *          Patient: [name], [place] DOB: 07/12/1931 Progress Note: [name].  [name], [name] 08/28/2017    ---    Note generated by eClinicalWorks EMR/PM Software ([email or web address])

## 2017-08-28 NOTE — Progress Notes (Signed)
* * *        **  [name]    --- ---    68 Y old Male, DOB: [birth year]    [address] , [place], [place] [address]    Home: [phone]    Provider: [name]        * * *    Telephone Encounter    ---    Answered by   Real Cons  Date: 08/28/2017         Time: 09:17 AM    Reason   okay for Lupron    --- ---            Action Taken                      [name],[name]  08/28/2017 9:17:31 AM > spoke with Elita Quick at [facility] okay for Lupron- no referral needed      [name],[name]  08/28/2017 11:44:14 AM > noted                    * * *                ---          * * *          Patient: [name] DOB: 10-06-1931 Provider: [name]  08/28/2017    ---    Note generated by eClinicalWorks EMR/PM Software ([email or web address])

## 2017-09-01 ENCOUNTER — Ambulatory Visit: Admitting: Vascular & Interventional Radiology

## 2017-09-02 ENCOUNTER — Ambulatory Visit: Admitting: Internal Medicine

## 2017-09-02 LAB — HX PHOSPHORUS LEVEL
CASE NUMBER: 2019177001263
HX PHOSPHORUS: 3.2 mg/dL — NL (ref 2.4–4.9)

## 2017-09-02 LAB — HX GLOMERULAR FILTRATION RATE (ESTIMATED)
CASE NUMBER: 2019177001263
HX AFN AMER GLOMERULAR FILTRATION RATE: 46 mL/min/{1.73_m2}
HX NON-AFN AMER GLOMERULAR FILTRATION RATE: 40 mL/min/{1.73_m2}

## 2017-09-02 LAB — HX CREATININE LEVEL
CASE NUMBER: 2019177001263
HX CREATININE: 1.56 mg/dL — ABNORMAL HIGH (ref 0.55–1.3)

## 2017-09-02 LAB — HX MAGNESIUM LEVEL
CASE NUMBER: 2019177001263
HX MAGNESIUM LVL: 2.3 mg/dL — NL (ref 1.7–2.5)

## 2017-09-02 LAB — HX CALCIUM LEVEL TOTAL
CASE NUMBER: 2019177001263
HX CALCIUM LVL: 8.9 mg/dL — NL (ref 8.5–10.5)

## 2017-09-02 LAB — HX ALBUMIN LEVEL
CASE NUMBER: 2019177001263
HX ALBUMIN LVL: 3.5 g/dL — NL (ref 3.2–5.0)

## 2017-09-03 ENCOUNTER — Ambulatory Visit: Admitting: Family

## 2017-09-03 NOTE — Other (Deleted)
[email or web address]-Onc Intake Entered On:  12/03/2017 16:41     Performed On:  12/03/2017 16:41  by Vladimir Crofts RN, Raynelle Fanning               General Information - Med-Onc   Fall Risk Assessment :   No   Have you been hospitalized since your last visit? :   No   Pain Symptoms :   No   Have you had any tests SLV :   No   VAD Monthly Flush :   No   Morissette RN, Raynelle Fanning - 12/03/2017 16:41    Social History   Current Smoking Status :   Former smoker   Did the patient smoke (past 12 months) :   No tobacco smoking   Patient used other tobacco products in the last 30 days? 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    Social History   (As Of: 12/03/2017 16:41:27 )   Tobacco:        Former smoker   (Last Updated: 10/19/2015 07:51:16  by Konrad Penta RN, Johnny Bridge)          Alcohol:        None   (Last Updated: 10/19/2015 07:51:23  by Konrad Penta RN, Johnny Bridge)          Employment/School:        Retired   (Last Updated: 08/14/2017 11:40:49  by Margaretha Glassing RN, [name])          Nutrition/Health:        Regular   (Last Updated: 08/14/2017 11:41:02  by Margaretha Glassing RN, [name])          Substance Abuse:        Never   (Last Updated: 10/19/2015 07:51:50  by Konrad Penta RN, Johnny Bridge)          Home/Environment:        Lives with Children.   Comments:  08/14/2017 11:49 - Margaretha Glassing RN, [name]: lives with daughter and brother has 7 children   (Last Updated: 08/14/2017 11:49:19  by Margaretha Glassing RN, [name])            ONC Nutrition   MST Patient Able to Complete Assessment :   Yes   MST Score :   0    MST Lose Weight Without Trying :   No   Appetite :   Good   Morissette RN, Raynelle Fanning - 12/03/2017 16:41

## 2017-09-03 NOTE — Other (Deleted)
[email or web address]-Onc Intake Entered On:  09/03/2017 16:36     Performed On:  09/03/2017 16:36  by Ladell Pier RN, Jasmine December Information - Med-Onc   Preferred Language :   [place]   Fall Risk Assessment :   Yes   Have you been hospitalized since your last visit? :   No   Pain Symptoms :   Yes   Have you had any tests SLV :   No   VAD Monthly Flush :   No   Cancer Fatigue Scale :   4-Moderate fatigue, need to take breaks on occasion   Cancer Fatigue Score :   4    Drinkwater RNRaynelle Fanning - 09/03/2017 16:36    Allergies   (As Of: 09/03/2017 16:36:55 )   Allergies (Active)   NKA  Estimated Onset Date:   Unspecified ; Created By:   Ronnie Doss; Reaction Status:   Active ; Category:   Drug ; Substance:   NKA ; Type:   Allergy ; Updated By:   Ronnie Doss; Reviewed Date:   09/03/2017 16:36         Social History   Current Smoking Status :   Former smoker   Did the patient smoke (past 12 months) :   No tobacco smoking   Patient used other tobacco products in the last 30 days? :   No   Environmental education officer - 09/03/2017 16:36    Social History   (As Of: 09/03/2017 16:36:55 )   Tobacco:        Former smoker   (Last Updated: 10/19/2015 07:51:16  by Konrad Penta RN, Johnny Bridge)          Alcohol:        None   (Last Updated: 10/19/2015 07:51:23  by Konrad Penta RN, Johnny Bridge)          Employment/School:        Retired   (Last Updated: 08/14/2017 11:40:49  by Margaretha Glassing RN, [name])          Nutrition/Health:        Regular   (Last Updated: 08/14/2017 11:41:02  by Margaretha Glassing RN, [name])          Substance Abuse:        Never   (Last Updated: 10/19/2015 07:51:50  by Konrad Penta RN, Johnny Bridge)          Home/Environment:        Lives with Children.   Comments:  08/14/2017 11:49 - Margaretha Glassing RN, [name]: lives with daughter and brother has 7 children   (Last Updated: 08/14/2017 11:49:19  by Margaretha Glassing RN, [name])            ONC Nutrition   MST Patient Able to Complete Assessment :   Yes   MST Score :   0    MST Lose Weight Without Trying :   No   Home Diet :   Regular    Appetite :   Fair   Feeding Ability :   Complete independence   Environmental education officer - 09/03/2017 16:36    Primary Pain   Primary Pain Location :   Lower back   Pain Scale Used :   0-10   Pain Score :   4    Primary Pain Interventions :   Pain medication previously taken, Repositioning, Rest   Drinkwater RNRaynelle Fanning - 09/03/2017 16:36    Image 1 -  Images currently  included in the form version of this document have not been included in the text rendition version of the form.

## 2017-09-03 NOTE — Other (Addendum)
Med-Onc Intake Entered On:  10/29/2017 16:05     Performed On:  10/29/2017 14:30  by Ladell Pier RN, Jasmine December Information - Med-Onc   Preferred Language :   [place]   Fall Risk Assessment :   Yes   Have you been hospitalized since your last visit? :   No   Pain Symptoms :   Yes   Have you had any tests SLV :   No   VAD Monthly Flush :   No   Cancer Fatigue Scale :   3   Cancer Fatigue Score :   3    Drinkwater RN, Raynelle Fanning - 10/29/2017 16:02    Allergies   (As Of: 10/29/2017 16:05:23 )   Allergies (Active)   NKA  Estimated Onset Date:   Unspecified ; Created By:   Ronnie Doss; Reaction Status:   Active ; Category:   Drug ; Substance:   NKA ; Type:   Allergy ; Updated By:   Ronnie Doss; Reviewed Date:   10/29/2017 16:03         Medication History   Medication List   (As Of: 10/29/2017 16:05:23 )   Normal Order    calcium gluconate concentrate  :   calcium gluconate concentrate ; Status:   Completed ; Ordered As Mnemonic:   calcium gluconate ; Simple Display Line:   1,000 mg, 10 mL, 285 ml/hr, IV Piggyback, Once ; Ordering Provider:   Kem Kays MD, Mendel Corning; Catalog Code:   calcium gluconate ; Order Dt/Tm:   10/29/2017 14:11:14            Home Meds    calcium carbonate  :   calcium carbonate ; Status:   Documented ; Ordered As Mnemonic:   calcium (as carbonate) 600 mg oral tablet ; Simple Display Line:   600 mg, 1 tab(s), PO, Daily, 0 Refill(s) ; Catalog Code:   calcium carbonate ; Order Dt/Tm:   10/02/2017 17:18:41          oxyCODONE  :   oxyCODONE ; Status:   Documented ; Ordered As Mnemonic:   oxyCODONE ; Simple Display Line:   5 mg, PO, as needed, 0 Refill(s) ; Catalog Code:   oxyCODONE ; Order Dt/Tm:   09/29/2017 15:24:05          cholecalciferol  :   cholecalciferol ; Status:   Documented ; Ordered As Mnemonic:   Vitamin D3 ; Simple Display Line:   1,000 Int_Unit, PO, Daily, 0 Refill(s) ; Catalog Code:   cholecalciferol ; Order Dt/Tm:   09/04/2017 14:19:46          codeine-guaifenesin  :    codeine-guaifenesin ; Status:   Documented ; Ordered As Mnemonic:   Robitussin AC ; Simple Display Line:   10 mL, PO, q4hr, 10ml twice a day as needed, 0 Refill(s) ; Catalog Code:   codeine-guaifenesin ; Order Dt/Tm:   08/14/2017 11:47:36          polyethylene glycol 3350  :   polyethylene glycol 3350 ; Status:   Documented ; Ordered As Mnemonic:   MiraLax oral powder for reconstitution ; Simple Display Line:   17 Gm, PO, Daily, PRN: Constipation, 0 Refill(s) ; Catalog Code:   polyethylene glycol 3350 ; Order Dt/Tm:   08/14/2017 11:47:11          ondansetron  :   ondansetron ; Status:   Documented ; Ordered  As Mnemonic:   Zofran 4 mg oral tablet ; Simple Display Line:   See Instructions, 1 tab(s) PO q8hr PRN, 0 Refill(s) ; Catalog Code:   ondansetron ; Order Dt/Tm:   08/14/2017 11:46:16          metoprolol  :   metoprolol ; Status:   Documented ; Ordered As Mnemonic:   metoprolol succinate 25 mg oral capsule, extended release ; Simple Display Line:   25 mg, 1 cap(s), PO, Daily, 0 Refill(s) ; Catalog Code:   metoprolol ; Order Dt/Tm:   08/14/2017 11:45:01          omeprazole  :   omeprazole ; Status:   Documented ; Ordered As Mnemonic:   omeprazole ; Simple Display Line:   20 mg, PO, Daily, 0 Refill(s) ; Catalog Code:   omeprazole ; Order Dt/Tm:   08/14/2017 11:45:57          fentaNYL  :   fentaNYL ; Status:   Documented ; Ordered As Mnemonic:   fentaNYL ; Simple Display Line:   25 mcg, q72hr, change q 3 days fentayl 25 mgPartial fill upon request, 0 Refill(s) ; Catalog Code:   fentaNYL ; Order Dt/Tm:   08/14/2017 11:39:32          loperamide  :   loperamide ; Status:   Documented ; Ordered As Mnemonic:   loperamide 2 mg oral tablet ; Simple Display Line:   2 mg, 1 tab(s), PO, q6hr, as needed, 0 Refill(s) ; Catalog Code:   loperamide ; Order Dt/Tm:   08/14/2017 11:38:26          bicalutamide  :   bicalutamide ; Status:   Documented ; Ordered As Mnemonic:   bicalutamide 50 mg oral tablet ; Simple Display Line:   50  mg, 1 tab(s), PO, q24hr, 0 Refill(s) ; Catalog Code:   bicalutamide ; Order Dt/Tm:   08/14/2017 11:37:59          acetaminophen  :   acetaminophen ; Status:   Documented ; Ordered As Mnemonic:   acetaminophen 325 mg oral tablet ; Simple Display Line:   650 mg, 2 tab(s), PO, q4hr, PRN: Fever/Pain, Mild to Moderate, 0 Refill(s) ; Ordering Provider:   Susette Racer MD, Collene Leyden; Catalog Code:   acetaminophen ; Order Dt/Tm:   07/22/2017 08:43:08          Al hydroxide/Mg hydroxide/simethicone  :   Al hydroxide/Mg hydroxide/simethicone ; Status:   Documented ; Ordered As Mnemonic:   aluminum hydroxide/magnesium hydroxide/simethicone 200 mg-200 mg-20 mg/5 mL oral suspension ; Simple Display Line:   30 mL, PO, q4hr, PRN: Dyspepsia, 0 Refill(s) ; Ordering Provider:   Susette Racer MD, Collene Leyden; Catalog Code:   Al hydroxide/Mg hydroxide/simethicone ; Order Dt/Tm:   07/22/2017 08:43:18          docusate  :   docusate ; Status:   Documented ; Ordered As Mnemonic:   docusate sodium 100 mg oral capsule ; Simple Display Line:   100 mg, 1 cap(s), PO, BID, PRN: Constipation, 0 Refill(s) ; Ordering Provider:   Susette Racer MD, Collene Leyden; Catalog Code:   docusate ; Order Dt/Tm:   07/22/2017 08:43:25          lisinopril  :   lisinopril ; Status:   Documented ; Ordered As Mnemonic:   lisinopril 5 mg oral tablet ; Simple Display Line:   5 mg, 1 tab(s), PO, Daily, 0 Refill(s) ; Ordering Provider:   Susette Racer MD, Gladstone Pih  Z; Catalog Code:   lisinopril ; Order Dt/Tm:   07/22/2017 08:43:40          aspirin  :   aspirin ; Status:   Documented ; Ordered As Mnemonic:   aspirin ; Simple Display Line:   81 mg, Chewed, Daily, 0 Refill(s) ; Catalog Code:   aspirin ; Order Dt/Tm:   07/18/2017 08:40:53          gabapentin  :   gabapentin ; Status:   Documented ; Ordered As Mnemonic:   gabapentin 300 mg oral capsule ; Simple Display Line:   300 mg, 1 cap(s), PO, BID, 0 Refill(s) ; Catalog Code:   gabapentin ; Order Dt/Tm:   07/12/2017 18:01:00          ALPRAZolam  :    ALPRAZolam ; Status:   Documented ; Ordered As Mnemonic:   Xanax ; Simple Display Line:   See Instructions, 0.25 mg PO TID PRN, PRN: as needed for anxiety, 0 Refill(s) ; Catalog Code:   ALPRAZolam ; Order Dt/Tm:   09/07/2015 07:07:23          cholecalciferol  :   cholecalciferol ; Status:   Discontinued ; Ordered As Mnemonic:   Vitamin D3 ; Simple Display Line:   0 Refill(s) ; Catalog Code:   cholecalciferol ; Order Dt/Tm:   10/02/2017 17:19:08            Social History   Current Smoking Status :   Former smoker   Did the patient smoke (past 12 months) :   No tobacco smoking   Patient used other tobacco products in the last 30 days? 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    Social History   (As Of: 10/29/2017 16:05:23 )   Tobacco:        Former smoker   (Last Updated: 10/19/2015 07:51:16  by Konrad Penta RN, Johnny Bridge)          Alcohol:        None   (Last Updated: 10/19/2015 07:51:23  by Konrad Penta RN, Johnny Bridge)          Employment/School:        Retired   (Last Updated: 08/14/2017 11:40:49  by Margaretha Glassing RN, [name])          Nutrition/Health:        Regular   (Last Updated: 08/14/2017 11:41:02  by Margaretha Glassing RN, [name])          Substance Abuse:        Never   (Last Updated: 10/19/2015 07:51:50  by Konrad Penta RN, Johnny Bridge)          Home/Environment:        Lives with Children.   Comments:  08/14/2017 11:49 - Margaretha Glassing RN, [name]: lives with daughter and brother has 7 children   (Last Updated: 08/14/2017 11:49:19  by Margaretha Glassing RN, [name])            ONC Nutrition   MST Patient Able to Complete Assessment :   Yes   MST Score :   0    MST Lose Weight Without Trying :   No   Home Diet :   Regular   Appetite :   Fair   Feeding Ability :   Complete independence   Environmental education officer - 10/29/2017 16:02    Primary Pain   Primary Pain Location :   Lower back   Pain Scale Used :   0-10   Pain  Score :   3    Primary Pain Interventions :   Repositioning, Rest   Primary Pain Time Pattern :   Chronic, Constant   Primary Pain Quality :   Aching   Primary Pain  Aggravating Factors :   Movement   Primary Pain Alleviating Factors :   Assistive devices   Associated Symptoms :   None   Pain Negatively Impacts :   Daily life   Drinkwater RN, Raynelle Fanning - 10/29/2017 16:02    Image 1 -  Images currently included in the form version of this document have not been included in the text rendition version of the form.

## 2017-09-03 NOTE — Other (Addendum)
Med-Onc Intake Entered On:  12/03/2017 16:41     Performed On:  12/03/2017 16:41  by Vladimir Crofts RN, Raynelle Fanning               General Information - Med-Onc   Fall Risk Assessment :   No   Have you been hospitalized since your last visit? :   No   Pain Symptoms :   No   Have you had any tests SLV :   No   VAD Monthly Flush :   No   Morissette RN, Raynelle Fanning - 12/03/2017 16:41    Social History   Current Smoking Status :   Former smoker   Did the patient smoke (past 12 months) :   No tobacco smoking   Patient used other tobacco products in the last 30 days? 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    Social History   (As Of: 12/03/2017 16:41:27 )   Tobacco:        Former smoker   (Last Updated: 10/19/2015 07:51:16  by Konrad Penta RN, Johnny Bridge)          Alcohol:        None   (Last Updated: 10/19/2015 07:51:23  by Konrad Penta RN, Johnny Bridge)          Employment/School:        Retired   (Last Updated: 08/14/2017 11:40:49  by Margaretha Glassing RN, [name])          Nutrition/Health:        Regular   (Last Updated: 08/14/2017 11:41:02  by Margaretha Glassing RN, [name])          Substance Abuse:        Never   (Last Updated: 10/19/2015 07:51:50  by Konrad Penta RN, Johnny Bridge)          Home/Environment:        Lives with Children.   Comments:  08/14/2017 11:49 - Margaretha Glassing RN, [name]: lives with daughter and brother has 7 children   (Last Updated: 08/14/2017 11:49:19  by Margaretha Glassing RN, [name])            ONC Nutrition   MST Patient Able to Complete Assessment :   Yes   MST Score :   0    MST Lose Weight Without Trying :   No   Appetite :   Good   Morissette RN, Raynelle Fanning - 12/03/2017 16:41

## 2017-09-03 NOTE — Other (Addendum)
Med-Onc Intake Entered On:  11/05/2017 15:58     Performed On:  11/05/2017 15:57  by Kendal Hymen               General Information - Med-Onc   Fall Risk Assessment :   No   Have you been hospitalized since your last visit? :   No   Pain Symptoms :   No   Have you had any tests SLV :   No   VAD Monthly Flush :   No   Kendal Hymen - 11/05/2017 15:57    Social History   Current Smoking Status :   Former smoker   Did the patient smoke (past 12 months) :   No tobacco smoking   Patient used other tobacco products in the last 30 days? :   No   Kendal Hymen - 11/05/2017 15:57    Social History   (As Of: 11/05/2017 15:58:20 )   Tobacco:        Former smoker   (Last Updated: 10/19/2015 07:51:16  by Konrad Penta RN, Johnny Bridge)          Alcohol:        None   (Last Updated: 10/19/2015 07:51:23  by Konrad Penta RN, Johnny Bridge)          Employment/School:        Retired   (Last Updated: 08/14/2017 11:40:49  by Margaretha Glassing RN, [name])          Nutrition/Health:        Regular   (Last Updated: 08/14/2017 11:41:02  by Margaretha Glassing RN, [name])          Substance Abuse:        Never   (Last Updated: 10/19/2015 07:51:50  by Konrad Penta RN, Johnny Bridge)          Home/Environment:        Lives with Children.   Comments:  08/14/2017 11:49 - Margaretha Glassing RN, [name]: lives with daughter and brother has 7 children   (Last Updated: 08/14/2017 11:49:19  by Margaretha Glassing RN, [name])            ONC Nutrition   MST Patient Able to Complete Assessment :   Yes   MST Score :   0    MST Lose Weight Without Trying :   No   Kendal Hymen - 11/05/2017 15:57

## 2017-09-03 NOTE — Other (Deleted)
[email or web address] Intake Entered On:  10/29/2017 16:05     Performed On:  10/29/2017 14:30  by Ladell Pier RN, Jasmine December Information - Med-Onc   Preferred Language :   [place]   Fall Risk Assessment :   Yes   Have you been hospitalized since your last visit? :   No   Pain Symptoms :   Yes   Have you had any tests SLV :   No   VAD Monthly Flush :   No   Cancer Fatigue Scale :   3   Cancer Fatigue Score :   3    Drinkwater RN, Raynelle Fanning - 10/29/2017 16:02    Allergies   (As Of: 10/29/2017 16:05:23 )   Allergies (Active)   NKA  Estimated Onset Date:   Unspecified ; Created By:   Ronnie Doss; Reaction Status:   Active ; Category:   Drug ; Substance:   NKA ; Type:   Allergy ; Updated By:   Ronnie Doss; Reviewed Date:   10/29/2017 16:03         Medication History   Medication List   (As Of: 10/29/2017 16:05:23 )   Normal Order    calcium gluconate concentrate  :   calcium gluconate concentrate ; Status:   Completed ; Ordered As Mnemonic:   calcium gluconate ; Simple Display Line:   1,000 mg, 10 mL, 285 ml/hr, IV Piggyback, Once ; Ordering Provider:   Kem Kays MD, Mendel Corning; Catalog Code:   calcium gluconate ; Order Dt/Tm:   10/29/2017 14:11:14            Home Meds    calcium carbonate  :   calcium carbonate ; Status:   Documented ; Ordered As Mnemonic:   calcium (as carbonate) 600 mg oral tablet ; Simple Display Line:   600 mg, 1 tab(s), PO, Daily, 0 Refill(s) ; Catalog Code:   calcium carbonate ; Order Dt/Tm:   10/02/2017 17:18:41          oxyCODONE  :   oxyCODONE ; Status:   Documented ; Ordered As Mnemonic:   oxyCODONE ; Simple Display Line:   5 mg, PO, as needed, 0 Refill(s) ; Catalog Code:   oxyCODONE ; Order Dt/Tm:   09/29/2017 15:24:05          cholecalciferol  :   cholecalciferol ; Status:   Documented ; Ordered As Mnemonic:   Vitamin D3 ; Simple Display Line:   1,000 Int_Unit, PO, Daily, 0 Refill(s) ; Catalog Code:   cholecalciferol ; Order Dt/Tm:   09/04/2017 14:19:46           codeine-guaifenesin  :   codeine-guaifenesin ; Status:   Documented ; Ordered As Mnemonic:   Robitussin AC ; Simple Display Line:   10 mL, PO, q4hr, 10ml twice a day as needed, 0 Refill(s) ; Catalog Code:   codeine-guaifenesin ; Order Dt/Tm:   08/14/2017 11:47:36          polyethylene glycol 3350  :   polyethylene glycol 3350 ; Status:   Documented ; Ordered As Mnemonic:   MiraLax oral powder for reconstitution ; Simple Display Line:   17 Gm, PO, Daily, PRN: Constipation, 0 Refill(s) ; Catalog Code:   polyethylene glycol 3350 ; Order Dt/Tm:   08/14/2017 11:47:11          ondansetron  :   ondansetron ; Status:   Documented ; Ordered  As Mnemonic:   Zofran 4 mg oral tablet ; Simple Display Line:   See Instructions, 1 tab(s) PO q8hr PRN, 0 Refill(s) ; Catalog Code:   ondansetron ; Order Dt/Tm:   08/14/2017 11:46:16          metoprolol  :   metoprolol ; Status:   Documented ; Ordered As Mnemonic:   metoprolol succinate 25 mg oral capsule, extended release ; Simple Display Line:   25 mg, 1 cap(s), PO, Daily, 0 Refill(s) ; Catalog Code:   metoprolol ; Order Dt/Tm:   08/14/2017 11:45:01          omeprazole  :   omeprazole ; Status:   Documented ; Ordered As Mnemonic:   omeprazole ; Simple Display Line:   20 mg, PO, Daily, 0 Refill(s) ; Catalog Code:   omeprazole ; Order Dt/Tm:   08/14/2017 11:45:57          fentaNYL  :   fentaNYL ; Status:   Documented ; Ordered As Mnemonic:   fentaNYL ; Simple Display Line:   25 mcg, q72hr, change q 3 days fentayl 25 mgPartial fill upon request, 0 Refill(s) ; Catalog Code:   fentaNYL ; Order Dt/Tm:   08/14/2017 11:39:32          loperamide  :   loperamide ; Status:   Documented ; Ordered As Mnemonic:   loperamide 2 mg oral tablet ; Simple Display Line:   2 mg, 1 tab(s), PO, q6hr, as needed, 0 Refill(s) ; Catalog Code:   loperamide ; Order Dt/Tm:   08/14/2017 11:38:26          bicalutamide  :   bicalutamide ; Status:   Documented ; Ordered As Mnemonic:   bicalutamide 50 mg oral tablet ;  Simple Display Line:   50 mg, 1 tab(s), PO, q24hr, 0 Refill(s) ; Catalog Code:   bicalutamide ; Order Dt/Tm:   08/14/2017 11:37:59          acetaminophen  :   acetaminophen ; Status:   Documented ; Ordered As Mnemonic:   acetaminophen 325 mg oral tablet ; Simple Display Line:   650 mg, 2 tab(s), PO, q4hr, PRN: Fever/Pain, Mild to Moderate, 0 Refill(s) ; Ordering Provider:   Susette Racer MD, Collene Leyden; Catalog Code:   acetaminophen ; Order Dt/Tm:   07/22/2017 08:43:08          Al hydroxide/Mg hydroxide/simethicone  :   Al hydroxide/Mg hydroxide/simethicone ; Status:   Documented ; Ordered As Mnemonic:   aluminum hydroxide/magnesium hydroxide/simethicone 200 mg-200 mg-20 mg/5 mL oral suspension ; Simple Display Line:   30 mL, PO, q4hr, PRN: Dyspepsia, 0 Refill(s) ; Ordering Provider:   Susette Racer MD, Collene Leyden; Catalog Code:   Al hydroxide/Mg hydroxide/simethicone ; Order Dt/Tm:   07/22/2017 08:43:18          docusate  :   docusate ; Status:   Documented ; Ordered As Mnemonic:   docusate sodium 100 mg oral capsule ; Simple Display Line:   100 mg, 1 cap(s), PO, BID, PRN: Constipation, 0 Refill(s) ; Ordering Provider:   Susette Racer MD, Collene Leyden; Catalog Code:   docusate ; Order Dt/Tm:   07/22/2017 08:43:25          lisinopril  :   lisinopril ; Status:   Documented ; Ordered As Mnemonic:   lisinopril 5 mg oral tablet ; Simple Display Line:   5 mg, 1 tab(s), PO, Daily, 0 Refill(s) ; Ordering Provider:   Susette Racer MD, Gladstone Pih  Z; Catalog Code:   lisinopril ; Order Dt/Tm:   07/22/2017 08:43:40          aspirin  :   aspirin ; Status:   Documented ; Ordered As Mnemonic:   aspirin ; Simple Display Line:   81 mg, Chewed, Daily, 0 Refill(s) ; Catalog Code:   aspirin ; Order Dt/Tm:   07/18/2017 08:40:53          gabapentin  :   gabapentin ; Status:   Documented ; Ordered As Mnemonic:   gabapentin 300 mg oral capsule ; Simple Display Line:   300 mg, 1 cap(s), PO, BID, 0 Refill(s) ; Catalog Code:   gabapentin ; Order Dt/Tm:   07/12/2017 18:01:00           ALPRAZolam  :   ALPRAZolam ; Status:   Documented ; Ordered As Mnemonic:   Xanax ; Simple Display Line:   See Instructions, 0.25 mg PO TID PRN, PRN: as needed for anxiety, 0 Refill(s) ; Catalog Code:   ALPRAZolam ; Order Dt/Tm:   09/07/2015 07:07:23          cholecalciferol  :   cholecalciferol ; Status:   Discontinued ; Ordered As Mnemonic:   Vitamin D3 ; Simple Display Line:   0 Refill(s) ; Catalog Code:   cholecalciferol ; Order Dt/Tm:   10/02/2017 17:19:08            Social History   Current Smoking Status :   Former smoker   Did the patient smoke (past 12 months) :   No tobacco smoking   Patient used other tobacco products in the last 30 days? 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    Social History   (As Of: 10/29/2017 16:05:23 )   Tobacco:        Former smoker   (Last Updated: 10/19/2015 07:51:16  by Konrad Penta RN, Johnny Bridge)          Alcohol:        None   (Last Updated: 10/19/2015 07:51:23  by Konrad Penta RN, Johnny Bridge)          Employment/School:        Retired   (Last Updated: 08/14/2017 11:40:49  by Margaretha Glassing RN, [name])          Nutrition/Health:        Regular   (Last Updated: 08/14/2017 11:41:02  by Margaretha Glassing RN, [name])          Substance Abuse:        Never   (Last Updated: 10/19/2015 07:51:50  by Konrad Penta RN, Johnny Bridge)          Home/Environment:        Lives with Children.   Comments:  08/14/2017 11:49 - Margaretha Glassing RN, [name]: lives with daughter and brother has 7 children   (Last Updated: 08/14/2017 11:49:19  by Margaretha Glassing RN, [name])            ONC Nutrition   MST Patient Able to Complete Assessment :   Yes   MST Score :   0    MST Lose Weight Without Trying :   No   Home Diet :   Regular   Appetite :   Fair   Feeding Ability :   Complete independence   Environmental education officer - 10/29/2017 16:02    Primary Pain   Primary Pain Location :   Lower back   Pain Scale Used :   0-10   Pain  Score :   3    Primary Pain Interventions :   Repositioning, Rest   Primary Pain Time Pattern :   Chronic, Constant   Primary Pain Quality :   Aching    Primary Pain Aggravating Factors :   Movement   Primary Pain Alleviating Factors :   Assistive devices   Associated Symptoms :   None   Pain Negatively Impacts :   Daily life   Drinkwater RN, Raynelle Fanning - 10/29/2017 16:02    Image 1 -  Images currently included in the form version of this document have not been included in the text rendition version of the form.

## 2017-09-03 NOTE — Other (Addendum)
Med-Onc Intake Entered On:  10/02/2017 17:19     Performed On:  10/02/2017 15:10  by Ladell Pier RN, Jasmine December Information - Med-Onc   Preferred Language :   [place]   Fall Risk Assessment :   Yes   Have you been hospitalized since your last visit? :   Yes   Pain Symptoms :   Yes   Have you had any tests SLV :   Yes   VAD Monthly Flush :   No   Cancer Fatigue Scale :   4-Moderate fatigue, need to take breaks on occasion   Cancer Fatigue Score :   4    Drinkwater RNRaynelle Fanning - 10/02/2017 17:17    Allergies   (As Of: 10/02/2017 17:19:41 )   Allergies (Active)   NKA  Estimated Onset Date:   Unspecified ; Created By:   Ronnie Doss; Reaction Status:   Active ; Category:   Drug ; Substance:   NKA ; Type:   Allergy ; Updated By:   Ronnie Doss; Reviewed Date:   10/02/2017 17:17         Medication History   Medication List   (As Of: 10/02/2017 17:19:41 )   Normal Order    calcium gluconate concentrate  :   calcium gluconate concentrate ; Status:   Completed ; Ordered As Mnemonic:   calcium gluconate ; Simple Display Line:   1 Gm, 285 ml/hr, IV Piggyback, Once ; Ordering Provider:   Kem Kays MD, Mendel Corning; Catalog Code:   calcium gluconate ; Order Dt/Tm:   10/02/2017 15:19:08          denosumab 120 mg/1.7 mL  :   denosumab 120 mg/1.7 mL ; Status:   Completed ; Ordered As Mnemonic:   Xgeva ; Simple Display Line:   120 mg, 1.7 mL, sc, Once ; Ordering Provider:   Kem Kays MD, Mendel Corning; Catalog Code:   denosumab ; Order Dt/Tm:   10/02/2017 15:26:21 ; Comment:   q4week treatment            Home Meds    cholecalciferol  :   cholecalciferol ; Status:   Documented ; Ordered As Mnemonic:   Vitamin D3 ; Simple Display Line:   0 Refill(s) ; Catalog Code:   cholecalciferol ; Order Dt/Tm:   10/02/2017 17:19:08          calcium carbonate  :   calcium carbonate ; Status:   Documented ; Ordered As Mnemonic:   calcium (as carbonate) 600 mg oral tablet ; Simple Display Line:   600 mg, 1 tab(s), PO, Daily, 0 Refill(s) ;  Catalog Code:   calcium carbonate ; Order Dt/Tm:   10/02/2017 17:18:41          oxyCODONE  :   oxyCODONE ; Status:   Documented ; Ordered As Mnemonic:   oxyCODONE ; Simple Display Line:   5 mg, PO, as needed, 0 Refill(s) ; Catalog Code:   oxyCODONE ; Order Dt/Tm:   09/29/2017 15:24:05          cholecalciferol  :   cholecalciferol ; Status:   Documented ; Ordered As Mnemonic:   Vitamin D3 ; Simple Display Line:   1,000 Int_Unit, PO, Daily, 0 Refill(s) ; Catalog Code:   cholecalciferol ; Order Dt/Tm:   09/04/2017 14:19:46          codeine-guaifenesin  :   codeine-guaifenesin ; Status:   Documented ;  Ordered As Mnemonic:   Robitussin AC ; Simple Display Line:   10 mL, PO, q4hr, 10ml twice a day as needed, 0 Refill(s) ; Catalog Code:   codeine-guaifenesin ; Order Dt/Tm:   08/14/2017 11:47:36          polyethylene glycol 3350  :   polyethylene glycol 3350 ; Status:   Documented ; Ordered As Mnemonic:   MiraLax oral powder for reconstitution ; Simple Display Line:   17 Gm, PO, Daily, PRN: Constipation, 0 Refill(s) ; Catalog Code:   polyethylene glycol 3350 ; Order Dt/Tm:   08/14/2017 11:47:11          ondansetron  :   ondansetron ; Status:   Documented ; Ordered As Mnemonic:   Zofran 4 mg oral tablet ; Simple Display Line:   See Instructions, 1 tab(s) PO q8hr PRN, 0 Refill(s) ; Catalog Code:   ondansetron ; Order Dt/Tm:   08/14/2017 11:46:16          metoprolol  :   metoprolol ; Status:   Documented ; Ordered As Mnemonic:   metoprolol succinate 25 mg oral capsule, extended release ; Simple Display Line:   25 mg, 1 cap(s), PO, Daily, 0 Refill(s) ; Catalog Code:   metoprolol ; Order Dt/Tm:   08/14/2017 11:45:01          omeprazole  :   omeprazole ; Status:   Documented ; Ordered As Mnemonic:   omeprazole ; Simple Display Line:   20 mg, PO, Daily, 0 Refill(s) ; Catalog Code:   omeprazole ; Order Dt/Tm:   08/14/2017 11:45:57          fentaNYL  :   fentaNYL ; Status:   Documented ; Ordered As Mnemonic:   fentaNYL ; Simple  Display Line:   25 mcg, q72hr, change q 3 days fentayl 25 mgPartial fill upon request, 0 Refill(s) ; Catalog Code:   fentaNYL ; Order Dt/Tm:   08/14/2017 11:39:32          loperamide  :   loperamide ; Status:   Documented ; Ordered As Mnemonic:   loperamide 2 mg oral tablet ; Simple Display Line:   2 mg, 1 tab(s), PO, q6hr, as needed, 0 Refill(s) ; Catalog Code:   loperamide ; Order Dt/Tm:   08/14/2017 11:38:26          bicalutamide  :   bicalutamide ; Status:   Documented ; Ordered As Mnemonic:   bicalutamide 50 mg oral tablet ; Simple Display Line:   50 mg, 1 tab(s), PO, q24hr, 0 Refill(s) ; Catalog Code:   bicalutamide ; Order Dt/Tm:   08/14/2017 11:37:59          acetaminophen  :   acetaminophen ; Status:   Documented ; Ordered As Mnemonic:   acetaminophen 325 mg oral tablet ; Simple Display Line:   650 mg, 2 tab(s), PO, q4hr, PRN: Fever/Pain, Mild to Moderate, 0 Refill(s) ; Ordering Provider:   Susette Racer MD, Collene Leyden; Catalog Code:   acetaminophen ; Order Dt/Tm:   07/22/2017 08:43:08          Al hydroxide/Mg hydroxide/simethicone  :   Al hydroxide/Mg hydroxide/simethicone ; Status:   Documented ; Ordered As Mnemonic:   aluminum hydroxide/magnesium hydroxide/simethicone 200 mg-200 mg-20 mg/5 mL oral suspension ; Simple Display Line:   30 mL, PO, q4hr, PRN: Dyspepsia, 0 Refill(s) ; Ordering Provider:   Susette Racer MD, Collene Leyden; Catalog Code:   Al hydroxide/Mg hydroxide/simethicone ; Order Dt/Tm:  07/22/2017 08:43:18          docusate  :   docusate ; Status:   Documented ; Ordered As Mnemonic:   docusate sodium 100 mg oral capsule ; Simple Display Line:   100 mg, 1 cap(s), PO, BID, PRN: Constipation, 0 Refill(s) ; Ordering Provider:   Susette Racer MD, Collene Leyden; Catalog Code:   docusate ; Order Dt/Tm:   07/22/2017 08:43:25          lisinopril  :   lisinopril ; Status:   Documented ; Ordered As Mnemonic:   lisinopril 5 mg oral tablet ; Simple Display Line:   5 mg, 1 tab(s), PO, Daily, 0 Refill(s) ; Ordering Provider:   Susette Racer  MD, Collene Leyden; Catalog Code:   lisinopril ; Order Dt/Tm:   07/22/2017 08:43:40          aspirin  :   aspirin ; Status:   Documented ; Ordered As Mnemonic:   aspirin ; Simple Display Line:   81 mg, Chewed, Daily, 0 Refill(s) ; Catalog Code:   aspirin ; Order Dt/Tm:   07/18/2017 08:40:53          gabapentin  :   gabapentin ; Status:   Documented ; Ordered As Mnemonic:   gabapentin 300 mg oral capsule ; Simple Display Line:   300 mg, 1 cap(s), PO, BID, 0 Refill(s) ; Catalog Code:   gabapentin ; Order Dt/Tm:   07/12/2017 18:01:00          ALPRAZolam  :   ALPRAZolam ; Status:   Documented ; Ordered As Mnemonic:   Xanax ; Simple Display Line:   See Instructions, 0.25 mg PO TID PRN, PRN: as needed for anxiety, 0 Refill(s) ; Catalog Code:   ALPRAZolam ; Order Dt/Tm:   09/07/2015 07:07:23            Social History   Current Smoking Status :   Former smoker   Did the patient smoke (past 12 months) :   No tobacco smoking   Patient used other tobacco products in the last 30 days? :   No   Environmental education officer - 10/02/2017 17:17    Social History   (As Of: 10/02/2017 17:19:41 )   Tobacco:        Former smoker   (Last Updated: 10/19/2015 07:51:16  by Konrad Penta RN, Johnny Bridge)          Alcohol:        None   (Last Updated: 10/19/2015 07:51:23  by Konrad Penta RN, Johnny Bridge)          Employment/School:        Retired   (Last Updated: 08/14/2017 11:40:49  by Margaretha Glassing RN, [name])          Nutrition/Health:        Regular   (Last Updated: 08/14/2017 11:41:02  by Margaretha Glassing RN, [name])          Substance Abuse:        Never   (Last Updated: 10/19/2015 07:51:50  by Konrad Penta RN, Johnny Bridge)          Home/Environment:        Lives with Children.   Comments:  08/14/2017 11:49 - Margaretha Glassing RN, [name]: lives with daughter and brother has 7 children   (Last Updated: 08/14/2017 11:49:19  by Margaretha Glassing RN, [name])            ONC Nutrition   MST Patient Able to Complete Assessment :   Yes   MST Score :  0    MST Lose Weight Without Trying :   No   Home Diet :   Regular   Appetite :   Good    Feeding Ability :   Complete independence   Environmental education officer - 10/02/2017 17:17    Primary Pain   Primary Pain Location :   Lower back   Pain Scale Used :   0-10   Pain Score :   3    Primary Pain Interventions :   Pain medication previously taken   Drinkwater RN, Raynelle Fanning - 10/02/2017 17:17    Image 1 -  Images currently included in the form version of this document have not been included in the text rendition version of the form.

## 2017-09-03 NOTE — Other (Deleted)
[email or web address] Intake Entered On:  11/05/2017 15:58     Performed On:  11/05/2017 15:57  by Kendal Hymen               General Information - Med-Onc   Fall Risk Assessment :   No   Have you been hospitalized since your last visit? :   No   Pain Symptoms :   No   Have you had any tests SLV :   No   VAD Monthly Flush :   No   Kendal Hymen - 11/05/2017 15:57    Social History   Current Smoking Status :   Former smoker   Did the patient smoke (past 12 months) :   No tobacco smoking   Patient used other tobacco products in the last 30 days? :   No   Kendal Hymen - 11/05/2017 15:57    Social History   (As Of: 11/05/2017 15:58:20 )   Tobacco:        Former smoker   (Last Updated: 10/19/2015 07:51:16  by Konrad Penta RN, Johnny Bridge)          Alcohol:        None   (Last Updated: 10/19/2015 07:51:23  by Konrad Penta RN, Johnny Bridge)          Employment/School:        Retired   (Last Updated: 08/14/2017 11:40:49  by Margaretha Glassing RN, [name])          Nutrition/Health:        Regular   (Last Updated: 08/14/2017 11:41:02  by Margaretha Glassing RN, [name])          Substance Abuse:        Never   (Last Updated: 10/19/2015 07:51:50  by Konrad Penta RN, Johnny Bridge)          Home/Environment:        Lives with Children.   Comments:  08/14/2017 11:49 - Margaretha Glassing RN, [name]: lives with daughter and brother has 7 children   (Last Updated: 08/14/2017 11:49:19  by Margaretha Glassing RN, [name])            ONC Nutrition   MST Patient Able to Complete Assessment :   Yes   MST Score :   0    MST Lose Weight Without Trying :   No   Kendal Hymen - 11/05/2017 15:57

## 2017-09-03 NOTE — Other (Addendum)
Med-Onc Intake Entered On:  09/03/2017 16:36     Performed On:  09/03/2017 16:36  by Ladell Pier RN, Jasmine December Information - Med-Onc   Preferred Language :   [place]   Fall Risk Assessment :   Yes   Have you been hospitalized since your last visit? :   No   Pain Symptoms :   Yes   Have you had any tests SLV :   No   VAD Monthly Flush :   No   Cancer Fatigue Scale :   4-Moderate fatigue, need to take breaks on occasion   Cancer Fatigue Score :   4    Drinkwater RNRaynelle Fanning - 09/03/2017 16:36    Allergies   (As Of: 09/03/2017 16:36:55 )   Allergies (Active)   NKA  Estimated Onset Date:   Unspecified ; Created By:   Ronnie Doss; Reaction Status:   Active ; Category:   Drug ; Substance:   NKA ; Type:   Allergy ; Updated By:   Ronnie Doss; Reviewed Date:   09/03/2017 16:36         Social History   Current Smoking Status :   Former smoker   Did the patient smoke (past 12 months) :   No tobacco smoking   Patient used other tobacco products in the last 30 days? :   No   Environmental education officer - 09/03/2017 16:36    Social History   (As Of: 09/03/2017 16:36:55 )   Tobacco:        Former smoker   (Last Updated: 10/19/2015 07:51:16  by Konrad Penta RN, Johnny Bridge)          Alcohol:        None   (Last Updated: 10/19/2015 07:51:23  by Konrad Penta RN, Johnny Bridge)          Employment/School:        Retired   (Last Updated: 08/14/2017 11:40:49  by Margaretha Glassing RN, [name])          Nutrition/Health:        Regular   (Last Updated: 08/14/2017 11:41:02  by Margaretha Glassing RN, [name])          Substance Abuse:        Never   (Last Updated: 10/19/2015 07:51:50  by Konrad Penta RN, Johnny Bridge)          Home/Environment:        Lives with Children.   Comments:  08/14/2017 11:49 - Margaretha Glassing RN, [name]: lives with daughter and brother has 7 children   (Last Updated: 08/14/2017 11:49:19  by Margaretha Glassing RN, [name])            ONC Nutrition   MST Patient Able to Complete Assessment :   Yes   MST Score :   0    MST Lose Weight Without Trying :   No   Home Diet :   Regular   Appetite :    Fair   Feeding Ability :   Complete independence   Environmental education officer - 09/03/2017 16:36    Primary Pain   Primary Pain Location :   Lower back   Pain Scale Used :   0-10   Pain Score :   4    Primary Pain Interventions :   Pain medication previously taken, Repositioning, Rest   Drinkwater RNRaynelle Fanning - 09/03/2017 16:36    Image 1 -  Images currently  included in the form version of this document have not been included in the text rendition version of the form.

## 2017-09-03 NOTE — Other (Deleted)
[email or web address] Intake Entered On:  10/02/2017 17:19     Performed On:  10/02/2017 15:10  by Ladell Pier RN, Jasmine December Information - Med-Onc   Preferred Language :   [place]   Fall Risk Assessment :   Yes   Have you been hospitalized since your last visit? :   Yes   Pain Symptoms :   Yes   Have you had any tests SLV :   Yes   VAD Monthly Flush :   No   Cancer Fatigue Scale :   4-Moderate fatigue, need to take breaks on occasion   Cancer Fatigue Score :   4    [place] RNRaynelle Fanning - 10/02/2017 17:17    Allergies   (As Of: 10/02/2017 17:19:41 )   Allergies (Active)   NKA  Estimated Onset Date:   Unspecified ; Created By:   Ronnie Doss; Reaction Status:   Active ; Category:   Drug ; Substance:   NKA ; Type:   Allergy ; Updated By:   Ronnie Doss; Reviewed Date:   10/02/2017 17:17         Medication History   Medication List   (As Of: 10/02/2017 17:19:41 )   Normal Order    calcium gluconate concentrate  :   calcium gluconate concentrate ; Status:   Completed ; Ordered As Mnemonic:   calcium gluconate ; Simple Display Line:   1 Gm, 285 ml/hr, IV Piggyback, Once ; Ordering Provider:   Kem Kays MD, Mendel Corning; Catalog Code:   calcium gluconate ; Order Dt/Tm:   10/02/2017 15:19:08          denosumab 120 mg/1.7 mL  :   denosumab 120 mg/1.7 mL ; Status:   Completed ; Ordered As Mnemonic:   Xgeva ; Simple Display Line:   120 mg, 1.7 mL, sc, Once ; Ordering Provider:   Kem Kays MD, Mendel Corning; Catalog Code:   denosumab ; Order Dt/Tm:   10/02/2017 15:26:21 ; Comment:   q4week treatment            Home Meds    cholecalciferol  :   cholecalciferol ; Status:   Documented ; Ordered As Mnemonic:   Vitamin D3 ; Simple Display Line:   0 Refill(s) ; Catalog Code:   cholecalciferol ; Order Dt/Tm:   10/02/2017 17:19:08          calcium carbonate  :   calcium carbonate ; Status:   Documented ; Ordered As Mnemonic:   calcium (as carbonate) 600 mg oral tablet ; Simple Display Line:   600 mg, 1 tab(s), PO, Daily, 0 Refill(s)  ; Catalog Code:   calcium carbonate ; Order Dt/Tm:   10/02/2017 17:18:41          oxyCODONE  :   oxyCODONE ; Status:   Documented ; Ordered As Mnemonic:   oxyCODONE ; Simple Display Line:   5 mg, PO, as needed, 0 Refill(s) ; Catalog Code:   oxyCODONE ; Order Dt/Tm:   09/29/2017 15:24:05          cholecalciferol  :   cholecalciferol ; Status:   Documented ; Ordered As Mnemonic:   Vitamin D3 ; Simple Display Line:   1,000 Int_Unit, PO, Daily, 0 Refill(s) ; Catalog Code:   cholecalciferol ; Order Dt/Tm:   09/04/2017 14:19:46          codeine-guaifenesin  :   codeine-guaifenesin ; Status:   Documented ;  Ordered As Mnemonic:   Robitussin AC ; Simple Display Line:   10 mL, PO, q4hr, 10ml twice a day as needed, 0 Refill(s) ; Catalog Code:   codeine-guaifenesin ; Order Dt/Tm:   08/14/2017 11:47:36          polyethylene glycol 3350  :   polyethylene glycol 3350 ; Status:   Documented ; Ordered As Mnemonic:   MiraLax oral powder for reconstitution ; Simple Display Line:   17 Gm, PO, Daily, PRN: Constipation, 0 Refill(s) ; Catalog Code:   polyethylene glycol 3350 ; Order Dt/Tm:   08/14/2017 11:47:11          ondansetron  :   ondansetron ; Status:   Documented ; Ordered As Mnemonic:   Zofran 4 mg oral tablet ; Simple Display Line:   See Instructions, 1 tab(s) PO q8hr PRN, 0 Refill(s) ; Catalog Code:   ondansetron ; Order Dt/Tm:   08/14/2017 11:46:16          metoprolol  :   metoprolol ; Status:   Documented ; Ordered As Mnemonic:   metoprolol succinate 25 mg oral capsule, extended release ; Simple Display Line:   25 mg, 1 cap(s), PO, Daily, 0 Refill(s) ; Catalog Code:   metoprolol ; Order Dt/Tm:   08/14/2017 11:45:01          omeprazole  :   omeprazole ; Status:   Documented ; Ordered As Mnemonic:   omeprazole ; Simple Display Line:   20 mg, PO, Daily, 0 Refill(s) ; Catalog Code:   omeprazole ; Order Dt/Tm:   08/14/2017 11:45:57          fentaNYL  :   fentaNYL ; Status:   Documented ; Ordered As Mnemonic:   fentaNYL ; Simple  Display Line:   25 mcg, q72hr, change q 3 days fentayl 25 mgPartial fill upon request, 0 Refill(s) ; Catalog Code:   fentaNYL ; Order Dt/Tm:   08/14/2017 11:39:32          loperamide  :   loperamide ; Status:   Documented ; Ordered As Mnemonic:   loperamide 2 mg oral tablet ; Simple Display Line:   2 mg, 1 tab(s), PO, q6hr, as needed, 0 Refill(s) ; Catalog Code:   loperamide ; Order Dt/Tm:   08/14/2017 11:38:26          bicalutamide  :   bicalutamide ; Status:   Documented ; Ordered As Mnemonic:   bicalutamide 50 mg oral tablet ; Simple Display Line:   50 mg, 1 tab(s), PO, q24hr, 0 Refill(s) ; Catalog Code:   bicalutamide ; Order Dt/Tm:   08/14/2017 11:37:59          acetaminophen  :   acetaminophen ; Status:   Documented ; Ordered As Mnemonic:   acetaminophen 325 mg oral tablet ; Simple Display Line:   650 mg, 2 tab(s), PO, q4hr, PRN: Fever/Pain, Mild to Moderate, 0 Refill(s) ; Ordering Provider:   Susette Racer MD, Collene Leyden; Catalog Code:   acetaminophen ; Order Dt/Tm:   07/22/2017 08:43:08          Al hydroxide/Mg hydroxide/simethicone  :   Al hydroxide/Mg hydroxide/simethicone ; Status:   Documented ; Ordered As Mnemonic:   aluminum hydroxide/magnesium hydroxide/simethicone 200 mg-200 mg-20 mg/5 mL oral suspension ; Simple Display Line:   30 mL, PO, q4hr, PRN: Dyspepsia, 0 Refill(s) ; Ordering Provider:   Susette Racer MD, Collene Leyden; Catalog Code:   Al hydroxide/Mg hydroxide/simethicone ; Order Dt/Tm:  07/22/2017 08:43:18          docusate  :   docusate ; Status:   Documented ; Ordered As Mnemonic:   docusate sodium 100 mg oral capsule ; Simple Display Line:   100 mg, 1 cap(s), PO, BID, PRN: Constipation, 0 Refill(s) ; Ordering Provider:   Susette Racer MD, Collene Leyden; Catalog Code:   docusate ; Order Dt/Tm:   07/22/2017 08:43:25          lisinopril  :   lisinopril ; Status:   Documented ; Ordered As Mnemonic:   lisinopril 5 mg oral tablet ; Simple Display Line:   5 mg, 1 tab(s), PO, Daily, 0 Refill(s) ; Ordering Provider:   Susette Racer  MD, Collene Leyden; Catalog Code:   lisinopril ; Order Dt/Tm:   07/22/2017 08:43:40          aspirin  :   aspirin ; Status:   Documented ; Ordered As Mnemonic:   aspirin ; Simple Display Line:   81 mg, Chewed, Daily, 0 Refill(s) ; Catalog Code:   aspirin ; Order Dt/Tm:   07/18/2017 08:40:53          gabapentin  :   gabapentin ; Status:   Documented ; Ordered As Mnemonic:   gabapentin 300 mg oral capsule ; Simple Display Line:   300 mg, 1 cap(s), PO, BID, 0 Refill(s) ; Catalog Code:   gabapentin ; Order Dt/Tm:   07/12/2017 18:01:00          ALPRAZolam  :   ALPRAZolam ; Status:   Documented ; Ordered As Mnemonic:   Xanax ; Simple Display Line:   See Instructions, 0.25 mg PO TID PRN, PRN: as needed for anxiety, 0 Refill(s) ; Catalog Code:   ALPRAZolam ; Order Dt/Tm:   09/07/2015 07:07:23            Social History   Current Smoking Status :   Former smoker   Did the patient smoke (past 12 months) :   No tobacco smoking   Patient used other tobacco products in the last 30 days? :   No   Environmental education officer - 10/02/2017 17:17    Social History   (As Of: 10/02/2017 17:19:41 )   Tobacco:        Former smoker   (Last Updated: 10/19/2015 07:51:16  by Konrad Penta RN, Johnny Bridge)          Alcohol:        None   (Last Updated: 10/19/2015 07:51:23  by Konrad Penta RN, Johnny Bridge)          Employment/School:        Retired   (Last Updated: 08/14/2017 11:40:49  by Margaretha Glassing RN, [name])          Nutrition/Health:        Regular   (Last Updated: 08/14/2017 11:41:02  by Margaretha Glassing RN, [name])          Substance Abuse:        Never   (Last Updated: 10/19/2015 07:51:50  by Konrad Penta RN, Johnny Bridge)          Home/Environment:        Lives with Children.   Comments:  08/14/2017 11:49 - Margaretha Glassing RN, [name]: lives with daughter and brother has 7 children   (Last Updated: 08/14/2017 11:49:19  by Margaretha Glassing RN, [name])            ONC Nutrition   MST Patient Able to Complete Assessment :   Yes   MST Score :  0    MST Lose Weight Without Trying :   No   Home Diet :   Regular   Appetite :   Good    Feeding Ability :   Complete independence   Environmental education officer - 10/02/2017 17:17    Primary Pain   Primary Pain Location :   Lower back   Pain Scale Used :   0-10   Pain Score :   3    Primary Pain Interventions :   Pain medication previously taken   [place] RN, Raynelle Fanning - 10/02/2017 17:17    Image 1 -  Images currently included in the form version of this document have not been included in the text rendition version of the form.

## 2017-09-04 ENCOUNTER — Ambulatory Visit: Admitting: Diagnostic Radiology

## 2017-09-04 LAB — HX PT
CASE NUMBER: 2019179001953
HX INR: 1.1
HX PT: 11.4 s — NL (ref 9.3–11.6)

## 2017-09-08 ENCOUNTER — Ambulatory Visit: Admitting: Diagnostic Radiology

## 2017-09-08 LAB — HX GLOMERULAR FILTRATION RATE (ESTIMATED)
CASE NUMBER: 2019183003293
HX AFN AMER GLOMERULAR FILTRATION RATE: 52 mL/min/{1.73_m2}
HX NON-AFN AMER GLOMERULAR FILTRATION RATE: 45 mL/min/{1.73_m2}

## 2017-09-08 LAB — HX BASIC METABOLIC PANEL
CASE NUMBER: 2019183003293
HX ANION GAP: 6 — NL (ref 3.0–11.0)
HX BUN: 15 mg/dL — NL (ref 8.0–23.0)
HX CALCIUM LVL: 7.2 mg/dL — ABNORMAL LOW (ref 8.5–10.5)
HX CHLORIDE: 112 mmol/L — ABNORMAL HIGH (ref 98.0–110.0)
HX CO2: 25 mmol/L — NL (ref 21.0–32.0)
HX CREATININE: 1.42 mg/dL — ABNORMAL HIGH (ref 0.55–1.3)
HX GLUCOSE LVL: 122 mg/dL — ABNORMAL HIGH (ref 70.0–110.0)
HX POTASSIUM LVL: 4.2 mmol/L — NL (ref 3.6–5.2)
HX SODIUM LVL: 143 mmol/L — NL (ref 136.0–146.0)

## 2017-09-08 LAB — HX CBC W/ INDICES
CASE NUMBER: 2019183001725
HX ABSOLUTE NRBC COUNT: 0 10*3/uL
HX HCT: 38.4 % — ABNORMAL LOW (ref 39.0–53.0)
HX HGB: 12.5 g/dL — ABNORMAL LOW (ref 13.0–17.5)
HX MCH: 32.2 pg — NL (ref 26.0–34.0)
HX MCHC: 32.6 g/dL — NL (ref 31.0–37.0)
HX MCV: 99 fL — NL (ref 80.0–100.0)
HX MPV: 9.9 fL — NL (ref 9.4–12.4)
HX NRBC PERCENT: 0 % — NL
HX PLATELET: 155 10*3/uL — NL (ref 150.0–400.0)
HX RBC: 3.88 10*6/uL — ABNORMAL LOW (ref 4.2–5.9)
HX RDW-CV: 13.9 % — NL (ref 11.5–14.5)
HX RDW-SD: 50.4 fL — NL (ref 35.0–51.0)
HX WBC: 6.1 10*3/uL — NL (ref 4.0–11.0)

## 2017-09-08 LAB — HX PT
CASE NUMBER: 2019183001725
HX INR: 1.1
HX PT: 11.1 s — NL (ref 9.3–11.6)

## 2017-09-08 NOTE — H&P (Signed)
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 MRI showed multiple boney mets in the visualized spine, most  prominently in the sacrum, L1 and L5, as well as acute fractures along the  superior endplate without significant compression deformity in the L2 and T11  vertebral bodies, corresponding to levels of tenderness on exam. Planned  percutaneous biopsy, ablation and kyphoplasty unfortunately had to be  scheduled for the morning. No overt discomfort at the time of my evaluation.  Non focal neuro exam, no numbness, no tingling sensation, no weakness.        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    SKIN:  Warm, dry, pink.    HEAD:  Normocephalic, atraumatic.    EYE:  Extraocular movements intact, conjunctiva clear, sclera anicteric.    ENT:  Mucus membranes moist, oral airway patent.    NECK:  Supple, no LAD.  No bruit.    CARDIOVASCULAR:  Regular rate and rhythm, S1/S2.    RESPIRATORY:  Lungs clear bilaterally, respirations non-labored.    CHEST WALL:  No tenderness, no deformity.    BACK:  Wearing back brace, tenderness on palpation from lower thoracic to  upper lumbar levels.    GASTROINTESTINAL:  Soft, non-tender.  Normoactive bowel sounds, no bruit.    EXTREMITIES:  Mild edema in  bilateral ankles.  Motor and sensation grossly  intact in lower extremities.    NEUROLOGICAL:  Alert and oriented to person, place, time and situation.    PSYCHIATRIC:  Cooperative, rather depressed mood and affect.    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        #Compression fractures    -Kyphoplasty to be performed in am by IR, pain contol        #Stage IV metastatic prostate Ca    -c/w bicalutamide        DVT ppx: SCD, no chemo ppx in anticipation of procedure in am        Face-to-face patient counseling/coordinating care more than 50% of encounter  time: Yes    Total encounter time: 45 minutes        Expect less than 2 midnights in the hospital        Problem List/Past Medical History    Ongoing    Anxiety    Arthritis    Atrial flutter    Back pain    Fracture of lumbar vertebrae    GERD - Gastro-esophageal reflux disease  Hematoma    HTN    Hyperlipidemia    Metastasis of malignant neoplasm to bone    Prostate cancer    SOB - Shortness of breath    Historical    No qualifying data    Procedure/Surgical History    repair wrist laceration.    Social History    _Alcohol_    None    _Employment/School_    Retired    [employer]    Lives with Children.    _Nutrition/Health_    Regular    _Substance Abuse_    Never    _Tobacco_    Former smoker    Family History    High blood pressure: Daughter and Daughter.    Kidney transplant: Daughter.    Allergies    NKA    Medications    _Inpatient_    acetaminophen 325 mg oral tablet, 650 mg= 2 tab(s), PO, q4hr, PRN    Al hydroxide/Mg hydroxide/simethicone, 30 mL, PO, q4hr, PRN    aspirin, 81 mg= 1 tab(s), Chewed, Daily    bicalutamide, 50 mg= 1 tab(s), PO, q24hr    docusate, 100 mg= 1 cap(s), PO, BID, PRN    fentaNYL, 25 mcg/hr= 1 patch(es), Transderm, q72hr    gabapentin, 300 mg= 1 cap(s), PO, BID    lisinopril, 5 mg= 1 tab(s), PO, Daily    MiraLax oral powder for reconstitution, 17  Gm= 1 EA, PO, Daily, PRN    oxyCODONE, 5 mg= 1 tab(s), PO, q6hr, PRN    pantoprazole, 40 mg= 1 tab(s), PO, Daily    Robitussin AC, 10 mL, PO, q4hr, PRN    Sodium Chloride 0.9% 1,000 mL, 1000 mL, IV    Toprol-XL, 25 mg= 1 tab(s), PO, Daily    Vitamin D3, 1000 Int_Unit, PO, Daily    Xanax, 0.25 mg= 1 tab(s), PO, TID, PRN    Zofran, 4 mg= 2 mL, IV Push, q8hr, PRN    _Home_    acetaminophen 325 mg oral tablet, 650 mg= 2 tab(s), PO, q4hr, PRN    aluminum hydroxide/magnesium hydroxide/simethicone 200 mg-200 mg-20 mg/5 mL  oral suspension, 30 mL, PO, q4hr, PRN    aspirin, 81 mg, Chewed, Daily    bicalutamide 50 mg oral tablet, 50 mg= 1 tab(s), PO, q24hr    docusate sodium 100 mg oral capsule, 100 mg= 1 cap(s), PO, BID, PRN    fentaNYL, 25 mcg, q72hr, change q 3 days fentayl 25 mgPartial fill upon  request    gabapentin 300 mg oral capsule, 300 mg= 1 cap(s), PO, BID    lisinopril 5 mg oral tablet, 5 mg= 1 tab(s), PO, Daily    loperamide 2 mg oral tablet, 2 mg= 1 tab(s), PO, q6hr, as needed    metoprolol succinate 25 mg oral capsule, extended release, 25 mg= 1 cap(s),  PO, Daily    MiraLax oral powder for reconstitution, 17 Gm, PO, Daily, PRN    omeprazole, 20 mg, PO, Daily    oxyCODONE 5 mg oral tablet, See Instructions, 1 tab(s) PO q6hr PRN    Robitussin AC, 10 mL, PO, q4hr, 10ml twice a day as needed    Vitamin D3, 1000 Int_Unit, PO, Daily    Xanax, See Instructions, PRN, 0.25 mg PO TID PRN    Zofran 4 mg oral tablet, See Instructions, 1 tab(s) PO q8hr PRN    Current medications were reviewed in detail with the patient    Diet    Regular Diet - Ordered    --  09/08/17 17:31:00 EDT, Room Service, Scheduled / PRN    Lab Results    Glucose Lvl: 122 mg/dL High (81/19/14 78:29:56)    BUN: 15 mg/dL (21/30/86 57:84:69)    Creatinine: 1.42 mg/dL High (62/95/28 41:32:44)    Afn Amer Glomerular Filtration Rate: 52 ml/min/1.6m2 (09/08/17 18:37:00)    Non-Afn Amer Glomerular Filtration Rate: 45 ml/min/1.36m2 (09/08/17 18:37:00)     Sodium Lvl: 143 mmol/L (09/08/17 18:37:00)    Potassium Lvl: 4.2 mmol/L (09/08/17 18:37:00)    Chloride: 112 mmol/L High (09/08/17 18:37:00)    CO2: 25 mmol/L (09/08/17 18:37:00)    Anion Gap: 6 (09/08/17 18:37:00)    Calcium Lvl: 7.2 mg/dL Low (03/11/70 53:66:44)    WBC: 6.1 thous/mm3 (09/08/17 12:11:00)    RBC: 3.88 Mil/mm3 Low (09/08/17 12:11:00)    Hgb: 12.5 Gm/dL Low (03/47/42 59:56:38)    Hct: 38.4 % Low (09/08/17 12:11:00)    Platelet: 155 thous/mm3 (09/08/17 12:11:00)    MCV: 99 fL (09/08/17 12:11:00)    MCH: 32.2 pGm (09/08/17 12:11:00)    MCHC: 32.6 Gm/dL (75/64/33 29:51:88)    RDW-SD: 50.4 fL (09/08/17 12:11:00)    MPV: 9.9 fL (09/08/17 12:11:00)    NRBC Percent: 0 % (09/08/17 12:11:00)    Absolute NRBC Count: 0 thous/mm3 (09/08/17 12:11:00)    PT: 11.1 sec (09/08/17 12:11:00)    INR: 1.1 (09/08/17 12:11:00)        ------        SIGNATURE LINE Electronically signed by Orson Ape MD, Joseandres Mazer on 09/09/2017  at 03:17:53 EST

## 2017-09-08 NOTE — Discharge Summary (Signed)
Date of Admission    09/08/17    Date of Discharge    09/09/17    Admission History    Pleases ee H and P at time of admission    Code Status    Code Status - Ordered    -- 09/08/17 17:06:00 EDT, Full Resuscitation, Constant Order    Allergies    NKA    Social History    _Alcohol_    None    _Employment/School_    Retired    [employer]    Lives with Children.    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 MRI showed multiple boney mets in the visualized spine, most  prominently in the sacrum, L1 and L5, as well as acute fractures along the  superior endplate without significant compression deformity in the L2 and T11  vertebral bodies, corresponding to levels of tenderness on exam. Planned  percutaneous biopsy, ablation and kyphoplasty unfortunately had to be  scheduled for the next day for which he was admitted overnight for  observation. He can be discharged if ok by IR after proceedure      Procedures and Treatment Provided    Physical Exam    Vitals & Measurements    **T: **97.7 F  (Oral) **TMIN: **97.7 F  (Oral) **TMAX: **98.8 F  (Temporal  Artery) **HR: **51 (Peripheral Pulse) **RR: **16 **BP: **158/74 **SpO2: **96%  **O2 Rate: **3 **O2 Method (L/min): **Nasal cannula **WT: **74.84 Kg    GENERAL: not in acute distress.    SKIN:  Warm, dry, pink.    HEAD:  Normocephalic, atraumatic.    EYE:  Extraocular movements intact, conjunctiva clear, sclera anicteric.    ENT:  Mucus membranes moist, oral airway patent.    NECK:  Supple, no LAD.  No bruit.    CARDIOVASCULAR:  Regular rate and rhythm, S1/S2.    RESPIRATORY:  Lungs clear bilaterally, respirations non-labored.    CHEST WALL:  No tenderness, no deformity.    BACK:  Wearing back brace, tenderness on palpation from lower thoracic to  upper lumbar levels.    GASTROINTESTINAL:  Soft,  non-tender.  Normoactive bowel sounds, no bruit.    EXTREMITIES:  Mild edema in bilateral ankles.  Motor and sensation grossly  intact in lower extremities.    NEUROLOGICAL:  Alert and oriented to person, place, time and situation.    PSYCHIATRIC:  Cooperative, rather depressed mood and affect.    Lab Results    Glucose Lvl: 78 mg/dL (16/10/96 04:54:09)    BUN: 17 mg/dL (81/19/14 78:29:56)    Creatinine: 1.42 mg/dL High (21/30/86 57:84:69)    Afn Amer Glomerular Filtration Rate: 52 ml/min/1.74m2 (09/09/17 07:02:00)    Non-Afn Amer Glomerular Filtration Rate: 45 ml/min/1.1m2 (09/09/17 07:02:00)    Sodium Lvl: 143 mmol/L (09/09/17 07:02:00)    Potassium Lvl: 4.9 mmol/L (09/09/17 07:02:00)    Chloride: 113 mmol/L High (09/09/17 07:02:00)    CO2: 24 mmol/L (09/09/17 07:02:00)    Anion Gap: 6 (09/09/17 07:02:00)    Calcium Lvl: 7.3 mg/dL Low (62/95/28 41:32:44)    WBC: 5.8 thous/mm3 (09/09/17 07:02:00)    RBC: 3.86 Mil/mm3 Low (09/09/17 07:02:00)    Hgb: 12.4 Gm/dL Low (03/11/70 53:66:44)    Hct: 38.2 % Low (09/09/17 07:02:00)  Platelet: 155 thous/mm3 (09/09/17 07:02:00)    MCV: 99 fL (09/09/17 07:02:00)    MCH: 32.1 pGm (09/09/17 07:02:00)    MCHC: 32.5 Gm/dL (16/10/96 04:54:09)    RDW-SD: 50.4 fL (09/09/17 07:02:00)    MPV: 10.5 fL (09/09/17 07:02:00)    Absolute Neutro Count: 2.76 thous/mm3 (09/09/17 07:02:00)    Absolute Lymphs Count: 1.74 thous/mm3 (09/09/17 07:02:00)    Absolute Mono Count: 0.63 thous/mm3 (09/09/17 07:02:00)    Absolute Eos Count: 0.59 thous/mm3 High (09/09/17 07:02:00)    Absolute Baso Count: 0.04 thous/mm3 (09/09/17 07:02:00)    Neutrophils: 47.8 % (09/09/17 07:02:00)    Lymphocytes: 30.2 % (09/09/17 07:02:00)    Monocytes: 10.9 % (09/09/17 07:02:00)    Eosinophils: 10.2 % (09/09/17 07:02:00)    Basophils: 0.7 % (09/09/17 07:02:00)    Immature Granulocytes: 0.2 % (09/09/17 07:02:00)    NRBC Percent: 0 % (09/09/17 07:02:00)    Absolute NRBC Count: 0 thous/mm3 (09/09/17 07:02:00)    PT: 10.9 sec  (09/09/17 08:18:00)    INR: 1.1 (09/09/17 08:18:00)    aPTT: 26 sec (09/09/17 08:18:00)    Discharge Diagnoses    84 year old male with new diagnosis of stage IV prostate cancer, 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        #Compression fractures- Kyphoplasty today        #Stage IV metastatic prostate Ca -c/w bicalutamide    Discharge Medications    _Discharge_    acetaminophen 325 mg oral tablet, 650 mg= 2 tab(s), PO, q4hr, PRN    aluminum hydroxide/magnesium hydroxide/simethicone 200 mg-200 mg-20 mg/5 mL  oral suspension, 30 mL, PO, q4hr, PRN    aspirin, 81 mg, Chewed, Daily    bicalutamide 50 mg oral tablet, 50 mg= 1 tab(s), PO, q24hr    docusate sodium 100 mg oral capsule, 100 mg= 1 cap(s), PO, BID, PRN    fentaNYL, 25 mcg, q72hr, change q 3 days fentayl 25 mgPartial fill upon  request    gabapentin 300 mg oral capsule, 300 mg= 1 cap(s), PO, BID    lisinopril 5 mg oral tablet, 5 mg= 1 tab(s), PO, Daily    loperamide 2 mg oral tablet, 2 mg= 1 tab(s), PO, q6hr, as needed    metoprolol succinate 25 mg oral capsule, extended release, 25 mg= 1 cap(s),  PO, Daily    MiraLax oral powder for reconstitution, 17 Gm, PO, Daily, PRN    omeprazole, 20 mg, PO, Daily    oxyCODONE 5 mg oral tablet, See Instructions, 1 tab(s) PO q6hr PRN    Robitussin AC, 10 mL, PO, q4hr, 10ml twice a day as needed    Vitamin D3, 1000 Int_Unit, PO, Daily    Xanax, See Instructions, PRN, 0.25 mg PO TID PRN    Zofran 4 mg oral tablet, See Instructions, 1 tab(s) PO q8hr PRN    Discharge Instructions    Please follwo up with PCP    Counseling    Face to Face    Face to face patient counselling/coordinating care more than 50% of encounter  time: Yes    Total encounter time: 20 min    SIGNATURE LINE Electronically signed by  MD, Harless Litten on 09/09/2017 at  16:10:57 EST

## 2017-09-09 LAB — HX .AUTOMATED DIFF
CASE NUMBER: 2019184000402
HX ABSOLUTE BASO COUNT: 0.04 10*3/uL — NL (ref 0.0–0.22)
HX ABSOLUTE EOS COUNT: 0.59 10*3/uL — ABNORMAL HIGH (ref 0.0–0.45)
HX ABSOLUTE LYMPHS COUNT: 1.74 10*3/uL — NL (ref 0.74–5.04)
HX ABSOLUTE MONO COUNT: 0.63 10*3/uL — NL (ref 0.0–1.34)
HX ABSOLUTE NEUTRO COUNT: 2.76 10*3/uL — NL (ref 1.48–7.95)
HX BASOPHILS: 0.7 %
HX EOSINOPHILS: 10.2 %
HX IMMATURE GRANULOCYTES: 0.2 % — NL (ref 0.0–2.0)
HX LYMPHOCYTES: 30.2 %
HX MONOCYTES: 10.9 %
HX NEUTROPHILS: 47.8 %

## 2017-09-09 LAB — HX CBC W/ DIFF
CASE NUMBER: 2019184000402
HX ABSOLUTE NRBC COUNT: 0 10*3/uL
HX HCT: 38.2 % — ABNORMAL LOW (ref 39.0–53.0)
HX HGB: 12.4 g/dL — ABNORMAL LOW (ref 13.0–17.5)
HX MCH: 32.1 pg — NL (ref 26.0–34.0)
HX MCHC: 32.5 g/dL — NL (ref 31.0–37.0)
HX MCV: 99 fL — NL (ref 80.0–100.0)
HX MPV: 10.5 fL — NL (ref 9.4–12.4)
HX NRBC PERCENT: 0 % — NL
HX PLATELET: 155 10*3/uL — NL (ref 150.0–400.0)
HX RBC: 3.86 10*6/uL — ABNORMAL LOW (ref 4.2–5.9)
HX RDW-CV: 14.1 % — NL (ref 11.5–14.5)
HX RDW-SD: 50.4 fL — NL (ref 35.0–51.0)
HX WBC: 5.8 10*3/uL — NL (ref 4.0–11.0)

## 2017-09-09 LAB — HX PT
CASE NUMBER: 2019184000402
HX INR: 1.1
HX PT: 10.9 s — NL (ref 9.3–11.6)

## 2017-09-09 LAB — HX BASIC METABOLIC PANEL
CASE NUMBER: 2019184000402
HX ANION GAP: 6 — NL (ref 3.0–11.0)
HX BUN: 17 mg/dL — NL (ref 8.0–23.0)
HX CALCIUM LVL: 7.3 mg/dL — ABNORMAL LOW (ref 8.5–10.5)
HX CHLORIDE: 113 mmol/L — ABNORMAL HIGH (ref 98.0–110.0)
HX CO2: 24 mmol/L — NL (ref 21.0–32.0)
HX CREATININE: 1.42 mg/dL — ABNORMAL HIGH (ref 0.55–1.3)
HX GLUCOSE LVL: 78 mg/dL — NL (ref 70.0–110.0)
HX POTASSIUM LVL: 4.9 mmol/L — NL (ref 3.6–5.2)
HX SODIUM LVL: 143 mmol/L — NL (ref 136.0–146.0)

## 2017-09-09 LAB — HX GLOMERULAR FILTRATION RATE (ESTIMATED)
CASE NUMBER: 2019184000402
HX AFN AMER GLOMERULAR FILTRATION RATE: 52 mL/min/{1.73_m2}
HX NON-AFN AMER GLOMERULAR FILTRATION RATE: 45 mL/min/{1.73_m2}

## 2017-09-09 LAB — HX PTT
CASE NUMBER: 2019184000402
HX APTT: 26 s — NL (ref 23.0–32.0)

## 2017-09-15 ENCOUNTER — Ambulatory Visit: Admitting: Family

## 2017-09-15 LAB — HX SURG PATH FINAL REPORT
CASE NUMBER: 0
HX NOTE: 88307

## 2017-09-15 NOTE — Other (Deleted)
Patient:   [name], [name]            MRN: [id number]            FIN: [id number]               Age:   84 years     Sex:  Male     DOB:  1931-11-21   Associated Diagnoses:   None   Author:   Lorraine Lax NP, [place] Citizen      Visit Information   Patient is seen under the supervision of Dr. Kem Kays.      Chief Complaint   metastatic prostate cancer    History of Presenting Illness:  This is a 84 year old male with history of a flutter, GERD, hyperlipidemia and hypertension now here for consultation regarding his new diagnosis of metastatic prostate cancer.  He is accompanied by his 2 daughters today     Briefly, he was recently admitted to the hospital s/p fall down the stairs, resulting in a back injury.  Extensive work-up including CT head, CT spine and MRI spine was done.  It revealed evidence of metastatic disease to the back and a mild compression fracture of L2.  There was no evidence of spinal cord compression.  PSA was checked and it returned high at 245.  He was treated conservatively for his fracture and was started on pain meds.  He was discharged to rehab where he currently resides.    Of note, he was given a prescription for bicalutamide at discharge. He started taking it today. He has an appt with his urologist Dr. [name] next week, at which time he is scheduled to receive a Lupron shot.         Interval History   Since his last visit here the patient has had his first injection of Lupron. He states he has had some night sweats but is feeling overall better. He did have the kyphoplasty to his back and is using oxycodone with good relief. He denies any new pains. He also states he started on the Xgeva injections as well. Denies fevers, chills, headache, shortness of breath.     Of note: he wishes to return home to [facility].      Review of Systems   Constitutional:  Fatigue, Decreased activity.    Eye:  Negative.    Ear/Nose/Mouth/Throat:  Negative.    Respiratory:  Negative.    Cardiovascular:  Negative.     Gastrointestinal:  Negative.    Genitourinary:  Negative.    Hematology/Lymphatics:  Negative.    Endocrine:  Negative.    Immunologic:  Negative.    Musculoskeletal:  Negative.    Integumentary:  Negative.    Neurologic:  Alert and oriented X4.    Psychiatric:  Negative.    All other systems are negative      Health Status   Allergies:    Allergic Reactions (Selected)  NKA,    Allergies (1) Active Reaction  NKA None Documented     Problem list:    All Problems  Anxiety / 6213086578 / Confirmed  Atrial flutter / 4696295 / Confirmed  Back pain / 284132440 / Confirmed  SOB - Shortness of breath / 102725366 / Confirmed  Fracture of lumbar vertebrae / 440347425 / Confirmed  GERD - Gastro-esophageal reflux disease / 9563875643 / Confirmed  Hematoma / 3295188416 / Confirmed  Hyperlipidemia / 60630160 / Confirmed  HTN / 1093235573 / Confirmed  Arthritis / 2202542706 / Confirmed  Prostate  cancer / 8119147829 / Confirmed  Metastasis of malignant neoplasm to bone / 562130865 / Confirmed,    Active Problems (12)  Anxiety   Arthritis   Atrial flutter   Back pain   Fracture of lumbar vertebrae   GERD - Gastro-esophageal reflux disease   Hematoma   HTN   Hyperlipidemia   Metastasis of malignant neoplasm to bone   Prostate cancer   SOB - Shortness of breath         Histories   Past Medical History:    No active or resolved past medical history items have been selected or recorded.   Family History:    High blood pressure  Daughter  Daughter  Kidney transplant  Daughter     Procedure history:    repair wrist laceration.   Social History        Social & Psychosocial Habits    Home/Environment  08/14/2017  Lives with: Children    Comment: lives with daughter and brother has 7 children - 08/14/2017 11:49 - Margaretha Glassing RN, [name]    Nutrition/Health  08/14/2017  Type of diet: Regular    Substance Abuse  10/19/2015  Use: Never    Tobacco  10/19/2015  Use: Former smoker    Alcohol  10/19/2015  Use: None    Employment/School  08/14/2017   Status: Retired  .        Physical Examination   Vital Signs   09/15/2017 15:19  Temperature Oral 97.4 DegF    Peripheral Pulse Rate 52 bpm    Respiratory Rate 20 br/min    Blood Pressure Extremity Right arm    Systolic Blood Pressure 161 mmHg  HI    Diastolic Blood Pressure 78 mmHg    Mean Arterial Pressure 106 mmHg    SpO2 97 %    Oxygen Therapy Room air      Measurements from flowsheet : Measurements   09/15/2017 15:19  Height 170 cm    Type of Height Stated height    Weight 75 Kg    Type of Weight Stated weight    BSA 1.86 m2    Body Mass Index 25.95 Kg/m2      General:  Alert and oriented, No acute distress.    Eye:  Normal conjunctiva.    HENT:  Normocephalic, Oral mucosa is moist, No pharyngeal erythema.    Neck:  Supple, Non-tender, No lymphadenopathy.    Respiratory:  Lungs are clear to auscultation, Respirations are non-labored, Breath sounds are equal, Symmetrical chest wall expansion, No chest wall tenderness.    Cardiovascular:  Normal rate, Regular rhythm, No murmur, No gallop, Good pulses equal in all extremities, Normal peripheral perfusion, No edema.    Integumentary:  Warm, Dry, Intact.    Neurologic:  Alert, Oriented.    Cognition and Speech:  Oriented, Speech clear and coherent.    Psychiatric:  Cooperative, Appropriate mood & affect.       Review / Management   Results review:  Lab results   09/09/2017 08:18  PT 10.9 sec    INR 1.1  NA    aPTT 26 sec   09/09/2017 07:02  Glucose Lvl 78 mg/dL    BUN 17 mg/dL    Creatinine 7.846 mg/dL  HI    Afn Amer Glomerular Filtration Rate 52 ml/min/1.42m2  NA    Non-Afn Amer Glomerular Filtration Rate 45 ml/min/1.54m2  NA    Sodium Lvl 143 mmol/L    Potassium Lvl 4.9 mmol/L  Chloride 113 mmol/L  HI    CO2 24 mmol/L    Anion Gap 6    Calcium Lvl 7.3 mg/dL  LOW    WBC 5.8 thous/mm3    RBC 3.86 Mil/mm3  LOW    Hgb 12.4 Gm/dL  LOW    Hct 16.1 %  LOW    Platelet 155 thous/mm3    MCV 99.0 fL    MCH 32.1 pGm    MCHC 32.5 Gm/dL    RDW-SD 09.6 fL    MPV 10.5 fL     Absolute Neutro Count 2.76 thous/mm3    Absolute Lymphs Count 1.74 thous/mm3    Absolute Mono Count 0.63 thous/mm3    Absolute Eos Count 0.59 thous/mm3  HI    Absolute Baso Count 0.04 thous/mm3    Neutrophils 47.8 %  NA    Lymphocytes 30.2 %  NA    Monocytes 10.9 %  NA    Eosinophils 10.2 %  NA    Basophils 0.7 %  NA    Immature Granulocytes 0.2 %    NRBC Percent 0.0 %    Absolute NRBC Count 0.00 thous/mm3  NA       Impression and Plan   This is an 84 year old male with a new diagnosis of stage IV prostate cancer, 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     Imaging thus far as well as the high PSA which indicates the diagnosis of stage IV prostate cancer.  He was recommended to have staging scans, CT chest/abdomen/pelvis and bone scan but he canceled these secondary to the cost.  He was started on bicalutamide and then received his first dose of Lupron as few days later.  He  also started [facility] for his bony mets. It is a monthly dose. He tolerated both these injections well. He does have hot flashes from the Lupron. He will continue.      The bicalutamide can be discontinued and patient will be maintained on Lupron alone until progression.      For his pain, he will continue on fentanyl patch and oxycodone as needed.  He was referred to Rad[facility] to see if there was a role for palliative XRT. He ultimately went for  kyphoplasty to his spine.     I will trend his PSA monthly.      Patient is agreeable to plan.  He will return for follow-up in 8 weeks.   He knows to call with questions/concerns anytime.                  Reviewed and electronically verified by:  Lorraine Lax NP, Carolinepar                                                                     on:  09/16/2017 14:38parpar Reviewed and electronically authenticated by:  Rosita Fire  par                                                                     on:  09/16/17 14:38parpar

## 2017-09-15 NOTE — Other (Deleted)
[email or web address] Nurse- Intake Entered On:  09/15/2017 15:23     Performed On:  09/15/2017 15:19  by Margaretha Glassing RN, [name]               Vital Signs   Pain Scale Used :   0-10   Temperature Oral :   97.4 DegF   Peripheral Pulse Rate :   52 bpm   Respiratory Rate :   20 br/min   Systolic Blood Pressure :   161 mmHg (HI)    Diastolic Blood Pressure :   78 mmHg   Blood Pressure Extremity :   Right arm   Mean Arterial Pressure :   106 mmHg   SpO2 :   97 %   Oxygen Therapy 1 :   Room air   [name], [name] - 09/15/2017 15:19    Height/Weight   Height :   170 cm(Converted to: 5 foot 7 in, 66.93 in)    Body Surface Area :   1.86 m2   Weight :   75 Kg(Converted to: 165 lb 6 oz)    Body Mass Index :   25.95 Kg/m2   Type of Weight :   Stated weight   Type of Height :   Stated height   [name], [name] - 09/15/2017 15:19    [facility] General Information/Subjective   Chief Complaint :   follow up prostate cancer  had recent kyphoplasty   High Risk for Falls :   Yes   Cancer Fatigue Scale :   6-Severe fatigue, interferes with ADLs, need breaks often   Cancer Fatigue Score :   6    Preferred Language :   English   Preferred Communication Mode :   Verbal   Pain Symptoms :   Yes   [name], [name] - 09/15/2017 15:19    Patient Preferred Method of Communication   Phone Call   Primary Pain   Primary Pain Location :   Lower back   Pain Scale Used :   0-10   Pain Score :   5    Primary Pain Interventions :   Medications   [name], [name] - 09/15/2017 15:19    Image 1 -  Images currently included in the form version of this document have not been included in the text rendition version of the form.   [facility] Infectious Disease Risk Screening   Patient Travel History :   No recent travel   Recent Family Travel History :   No recent travel   [name], [name] - 09/15/2017 15:19    Allergy   (As Of: 09/15/2017 15:23:44 )   Allergies (Active)   NKA  Estimated Onset Date:   Unspecified ; Created By:   Ronnie Doss; Reaction Status:   Active ; Category:   Drug ;  Substance:   NKA ; Type:   Allergy ; Updated By:   Ronnie Doss; Reviewed Date:   09/15/2017 15:21         Medication History   Medication List   (As Of: 09/15/2017 15:23:44 )   Home Meds    cholecalciferol  :   cholecalciferol ; Status:   Documented ; Ordered As Mnemonic:   Vitamin D3 ; Simple Display Line:   1,000 Int_Unit, PO, Daily, 0 Refill(s) ; Catalog Code:   cholecalciferol ; Order Dt/Tm:   09/04/2017 14:19:46          codeine-guaifenesin  :   codeine-guaifenesin ; Status:  Documented ; Ordered As Mnemonic:   Robitussin AC ; Simple Display Line:   10 mL, PO, q4hr, 10ml twice a day as needed, 0 Refill(s) ; Catalog Code:   codeine-guaifenesin ; Order Dt/Tm:   08/14/2017 11:47:36          docusate  :   docusate ; Status:   Documented ; Ordered As Mnemonic:   docusate sodium 100 mg oral capsule ; Simple Display Line:   100 mg, 1 cap(s), PO, BID, PRN: Constipation, 0 Refill(s) ; Ordering Provider:   Susette Racer MD, Collene Leyden; Catalog Code:   docusate ; Order Dt/Tm:   07/22/2017 08:43:25          polyethylene glycol 3350  :   polyethylene glycol 3350 ; Status:   Documented ; Ordered As Mnemonic:   MiraLax oral powder for reconstitution ; Simple Display Line:   17 Gm, PO, Daily, PRN: Constipation, 0 Refill(s) ; Catalog Code:   polyethylene glycol 3350 ; Order Dt/Tm:   08/14/2017 11:47:11          loperamide  :   loperamide ; Status:   Documented ; Ordered As Mnemonic:   loperamide 2 mg oral tablet ; Simple Display Line:   2 mg, 1 tab(s), PO, q6hr, as needed, 0 Refill(s) ; Catalog Code:   loperamide ; Order Dt/Tm:   08/14/2017 11:38:26          omeprazole  :   omeprazole ; Status:   Documented ; Ordered As Mnemonic:   omeprazole ; Simple Display Line:   20 mg, PO, Daily, 0 Refill(s) ; Catalog Code:   omeprazole ; Order Dt/Tm:   08/14/2017 11:45:57          fentaNYL  :   fentaNYL ; Status:   Documented ; Ordered As Mnemonic:   fentaNYL ; Simple Display Line:   25 mcg, q72hr, change q 3 days fentayl 25 mgPartial fill  upon request, 0 Refill(s) ; Catalog Code:   fentaNYL ; Order Dt/Tm:   08/14/2017 11:39:32          metoprolol  :   metoprolol ; Status:   Documented ; Ordered As Mnemonic:   metoprolol succinate 25 mg oral capsule, extended release ; Simple Display Line:   25 mg, 1 cap(s), PO, Daily, 0 Refill(s) ; Catalog Code:   metoprolol ; Order Dt/Tm:   08/14/2017 11:45:01          Al hydroxide/Mg hydroxide/simethicone  :   Al hydroxide/Mg hydroxide/simethicone ; Status:   Documented ; Ordered As Mnemonic:   aluminum hydroxide/magnesium hydroxide/simethicone 200 mg-200 mg-20 mg/5 mL oral suspension ; Simple Display Line:   30 mL, PO, q4hr, PRN: Dyspepsia, 0 Refill(s) ; Ordering Provider:   Susette Racer MD, Collene Leyden; Catalog Code:   Al hydroxide/Mg hydroxide/simethicone ; Order Dt/Tm:   07/22/2017 08:43:18          gabapentin  :   gabapentin ; Status:   Documented ; Ordered As Mnemonic:   gabapentin 300 mg oral capsule ; Simple Display Line:   300 mg, 1 cap(s), PO, BID, 0 Refill(s) ; Catalog Code:   gabapentin ; Order Dt/Tm:   07/12/2017 18:01:00          lisinopril  :   lisinopril ; Status:   Documented ; Ordered As Mnemonic:   lisinopril 5 mg oral tablet ; Simple Display Line:   5 mg, 1 tab(s), PO, Daily, 0 Refill(s) ; Ordering Provider:   Susette Racer MD, Collene Leyden; Catalog Code:  lisinopril ; Order Dt/Tm:   07/22/2017 08:43:40          bicalutamide  :   bicalutamide ; Status:   Documented ; Ordered As Mnemonic:   bicalutamide 50 mg oral tablet ; Simple Display Line:   50 mg, 1 tab(s), PO, q24hr, 0 Refill(s) ; Catalog Code:   bicalutamide ; Order Dt/Tm:   08/14/2017 11:37:59          acetaminophen  :   acetaminophen ; Status:   Documented ; Ordered As Mnemonic:   acetaminophen 325 mg oral tablet ; Simple Display Line:   650 mg, 2 tab(s), PO, q4hr, PRN: Fever/Pain, Mild to Moderate, 0 Refill(s) ; Ordering Provider:   Susette Racer MD, Collene Leyden; Catalog Code:   acetaminophen ; Order Dt/Tm:   07/22/2017 08:43:08          aspirin  :   aspirin ;  Status:   Documented ; Ordered As Mnemonic:   aspirin ; Simple Display Line:   81 mg, Chewed, Daily, 0 Refill(s) ; Catalog Code:   aspirin ; Order Dt/Tm:   07/18/2017 08:40:53          ondansetron  :   ondansetron ; Status:   Documented ; Ordered As Mnemonic:   Zofran 4 mg oral tablet ; Simple Display Line:   See Instructions, 1 tab(s) PO q8hr PRN, 0 Refill(s) ; Catalog Code:   ondansetron ; Order Dt/Tm:   08/14/2017 11:46:16          ALPRAZolam  :   ALPRAZolam ; Status:   Documented ; Ordered As Mnemonic:   Xanax ; Simple Display Line:   See Instructions, 0.25 mg PO TID PRN, PRN: as needed for anxiety, 0 Refill(s) ; Catalog Code:   ALPRAZolam ; Order Dt/Tm:   09/07/2015 07:07:23            [facility] Social History   Cigarette Smoking Last 365 Days :   No   Exposure to Tobacco Smoke :   Former smoker   Patient used other tobacco products in the last 30 days? :   No   [name], [name] - 09/15/2017 15:19    Social History   (As Of: 09/15/2017 15:23:44 )   Tobacco:        Former smoker   (Last Updated: 10/19/2015 07:51:16  by Konrad Penta RN, [place] Bridge)          Alcohol:        None   (Last Updated: 10/19/2015 07:51:23  by Konrad Penta RN, Johnny Bridge)          Employment/School:        Retired   (Last Updated: 08/14/2017 11:40:49  by Margaretha Glassing RN, [name])          Nutrition/Health:        Regular   (Last Updated: 08/14/2017 11:41:02  by Margaretha Glassing RN, [name])          Substance Abuse:        Never   (Last Updated: 10/19/2015 07:51:50  by Konrad Penta RN, Johnny Bridge)          Home/Environment:        Lives with Children.   Comments:  08/14/2017 11:49 - Margaretha Glassing RN, [name]: lives with daughter and brother has 7 children   (Last Updated: 08/14/2017 11:49:19  by Margaretha Glassing RN, [name])            Advance Directive   Advanced Directives :   No       [name], [name] -  09/15/2017 15:24      [facility] ONC Nutrition   Home Diet :   Regular   Appetite :   Annamaria Boots RN, [name] - 09/15/2017 15:19    [facility] Encounter   Onc Visit Level, Existing :   Level 1   [name], [name] - 09/17/2017 13:22    Onc Type  of Patient :   Outpatient Visit Existing   [name], [name] - 09/15/2017 15:19

## 2017-09-15 NOTE — Other (Addendum)
ONC Nurse-[facility]man Intake Entered On:  09/15/2017 15:23     Performed On:  09/15/2017 15:19  by Margaretha Glassing RN, [name]               Vital Signs   Pain Scale Used :   0-10   Temperature Oral :   97.4 DegF   Peripheral Pulse Rate :   52 bpm   Respiratory Rate :   20 br/min   Systolic Blood Pressure :   161 mmHg (HI)    Diastolic Blood Pressure :   78 mmHg   Blood Pressure Extremity :   Right arm   Mean Arterial Pressure :   106 mmHg   SpO2 :   97 %   Oxygen Therapy 1 :   Room air   [facility], [name] - 09/15/2017 15:19    Height/Weight   Height :   170 cm(Converted to: 5 foot 7 in, 66.93 in)    Body Surface Area :   1.86 m2   Weight :   75 Kg(Converted to: 165 lb 6 oz)    Body Mass Index :   25.95 Kg/m2   Type of Weight :   Stated weight   Type of Height :   Stated height   [name], [name] - 09/15/2017 15:19    [facility] General Information/Subjective   Chief Complaint :   follow up prostate cancer  had recent kyphoplasty   High Risk for Falls :   Yes   Cancer Fatigue Scale :   6-Severe fatigue, interferes with ADLs, need breaks often   Cancer Fatigue Score :   6    Preferred Language :   English   Preferred Communication Mode :   Verbal   Pain Symptoms :   Yes   [name], [name] - 09/15/2017 15:19    Patient Preferred Method of Communication   Phone Call   Primary Pain   Primary Pain Location :   Lower back   Pain Scale Used :   0-10   Pain Score :   5    Primary Pain Interventions :   Medications   [name], [name] - 09/15/2017 15:19    Image 1 -  Images currently included in the form version of this document have not been included in the text rendition version of the form.   CCA Infectious Disease Risk Screening   Patient Travel History :   No recent travel   Recent Family Travel History :   No recent travel   [name], [name] - 09/15/2017 15:19    Allergy   (As Of: 09/15/2017 15:23:44 )   Allergies (Active)   NKA  Estimated Onset Date:   Unspecified ; Created By:   Ronnie Doss; Reaction Status:   Active ; Category:   Drug ; Substance:    NKA ; Type:   Allergy ; Updated By:   Ronnie Doss; Reviewed Date:   09/15/2017 15:21         Medication History   Medication List   (As Of: 09/15/2017 15:23:44 )   Home Meds    cholecalciferol  :   cholecalciferol ; Status:   Documented ; Ordered As Mnemonic:   Vitamin D3 ; Simple Display Line:   1,000 Int_Unit, PO, Daily, 0 Refill(s) ; Catalog Code:   cholecalciferol ; Order Dt/Tm:   09/04/2017 14:19:46          codeine-guaifenesin  :   codeine-guaifenesin ; Status:  Documented ; Ordered As Mnemonic:   Robitussin AC ; Simple Display Line:   10 mL, PO, q4hr, 10ml twice a day as needed, 0 Refill(s) ; Catalog Code:   codeine-guaifenesin ; Order Dt/Tm:   08/14/2017 11:47:36          docusate  :   docusate ; Status:   Documented ; Ordered As Mnemonic:   docusate sodium 100 mg oral capsule ; Simple Display Line:   100 mg, 1 cap(s), PO, BID, PRN: Constipation, 0 Refill(s) ; Ordering Provider:   Susette Racer MD, Collene Leyden; Catalog Code:   docusate ; Order Dt/Tm:   07/22/2017 08:43:25          polyethylene glycol 3350  :   polyethylene glycol 3350 ; Status:   Documented ; Ordered As Mnemonic:   MiraLax oral powder for reconstitution ; Simple Display Line:   17 Gm, PO, Daily, PRN: Constipation, 0 Refill(s) ; Catalog Code:   polyethylene glycol 3350 ; Order Dt/Tm:   08/14/2017 11:47:11          loperamide  :   loperamide ; Status:   Documented ; Ordered As Mnemonic:   loperamide 2 mg oral tablet ; Simple Display Line:   2 mg, 1 tab(s), PO, q6hr, as needed, 0 Refill(s) ; Catalog Code:   loperamide ; Order Dt/Tm:   08/14/2017 11:38:26          omeprazole  :   omeprazole ; Status:   Documented ; Ordered As Mnemonic:   omeprazole ; Simple Display Line:   20 mg, PO, Daily, 0 Refill(s) ; Catalog Code:   omeprazole ; Order Dt/Tm:   08/14/2017 11:45:57          fentaNYL  :   fentaNYL ; Status:   Documented ; Ordered As Mnemonic:   fentaNYL ; Simple Display Line:   25 mcg, q72hr, change q 3 days fentayl 25 mgPartial fill upon request, 0  Refill(s) ; Catalog Code:   fentaNYL ; Order Dt/Tm:   08/14/2017 11:39:32          metoprolol  :   metoprolol ; Status:   Documented ; Ordered As Mnemonic:   metoprolol succinate 25 mg oral capsule, extended release ; Simple Display Line:   25 mg, 1 cap(s), PO, Daily, 0 Refill(s) ; Catalog Code:   metoprolol ; Order Dt/Tm:   08/14/2017 11:45:01          Al hydroxide/Mg hydroxide/simethicone  :   Al hydroxide/Mg hydroxide/simethicone ; Status:   Documented ; Ordered As Mnemonic:   aluminum hydroxide/magnesium hydroxide/simethicone 200 mg-200 mg-20 mg/5 mL oral suspension ; Simple Display Line:   30 mL, PO, q4hr, PRN: Dyspepsia, 0 Refill(s) ; Ordering Provider:   Susette Racer MD, Collene Leyden; Catalog Code:   Al hydroxide/Mg hydroxide/simethicone ; Order Dt/Tm:   07/22/2017 08:43:18          gabapentin  :   gabapentin ; Status:   Documented ; Ordered As Mnemonic:   gabapentin 300 mg oral capsule ; Simple Display Line:   300 mg, 1 cap(s), PO, BID, 0 Refill(s) ; Catalog Code:   gabapentin ; Order Dt/Tm:   07/12/2017 18:01:00          lisinopril  :   lisinopril ; Status:   Documented ; Ordered As Mnemonic:   lisinopril 5 mg oral tablet ; Simple Display Line:   5 mg, 1 tab(s), PO, Daily, 0 Refill(s) ; Ordering Provider:   Susette Racer MD, Collene Leyden; Catalog Code:  lisinopril ; Order Dt/Tm:   07/22/2017 08:43:40          bicalutamide  :   bicalutamide ; Status:   Documented ; Ordered As Mnemonic:   bicalutamide 50 mg oral tablet ; Simple Display Line:   50 mg, 1 tab(s), PO, q24hr, 0 Refill(s) ; Catalog Code:   bicalutamide ; Order Dt/Tm:   08/14/2017 11:37:59          acetaminophen  :   acetaminophen ; Status:   Documented ; Ordered As Mnemonic:   acetaminophen 325 mg oral tablet ; Simple Display Line:   650 mg, 2 tab(s), PO, q4hr, PRN: Fever/Pain, Mild to Moderate, 0 Refill(s) ; Ordering Provider:   Susette Racer MD, Collene Leyden; Catalog Code:   acetaminophen ; Order Dt/Tm:   07/22/2017 08:43:08          aspirin  :   aspirin ; Status:   Documented  ; Ordered As Mnemonic:   aspirin ; Simple Display Line:   81 mg, Chewed, Daily, 0 Refill(s) ; Catalog Code:   aspirin ; Order Dt/Tm:   07/18/2017 08:40:53          ondansetron  :   ondansetron ; Status:   Documented ; Ordered As Mnemonic:   Zofran 4 mg oral tablet ; Simple Display Line:   See Instructions, 1 tab(s) PO q8hr PRN, 0 Refill(s) ; Catalog Code:   ondansetron ; Order Dt/Tm:   08/14/2017 11:46:16          ALPRAZolam  :   ALPRAZolam ; Status:   Documented ; Ordered As Mnemonic:   Xanax ; Simple Display Line:   See Instructions, 0.25 mg PO TID PRN, PRN: as needed for anxiety, 0 Refill(s) ; Catalog Code:   ALPRAZolam ; Order Dt/Tm:   09/07/2015 07:07:23            CCA Social History   Cigarette Smoking Last 365 Days :   No   Exposure to Tobacco Smoke :   Former smoker   Patient used other tobacco products in the last 30 days? :   No   [name], [name] - 09/15/2017 15:19    Social History   (As Of: 09/15/2017 15:23:44 )   Tobacco:        Former smoker   (Last Updated: 10/19/2015 07:51:16  by Konrad Penta RN, Johnny Bridge)          Alcohol:        None   (Last Updated: 10/19/2015 07:51:23  by Konrad Penta RN, Johnny Bridge)          Employment/School:        Retired   (Last Updated: 08/14/2017 11:40:49  by Margaretha Glassing RN, [name])          Nutrition/Health:        Regular   (Last Updated: 08/14/2017 11:41:02  by Margaretha Glassing RN, [name])          Substance Abuse:        Never   (Last Updated: 10/19/2015 07:51:50  by Konrad Penta RN, Johnny Bridge)          Home/Environment:        Lives with Children.   Comments:  08/14/2017 11:49 - Margaretha Glassing RN, [name]: lives with daughter and brother has 7 children   (Last Updated: 08/14/2017 11:49:19  by Margaretha Glassing RN, [name])            Advance Directive   Advanced Directives :   No       [name], [name] -  09/15/2017 15:24      [facility] ONC Nutrition   Home Diet :   Regular   Appetite :   Annamaria Boots RN, [name] - 09/15/2017 15:19    CCA Encounter   Onc Visit Level, Existing :   Level 1   [name], [name] - 09/17/2017 13:22    Onc Type of Patient :    Outpatient Visit Existing   [name], [name] - 09/15/2017 15:19

## 2017-09-15 NOTE — Other (Addendum)
Patient:   [name], [name]            MRN: [id number]            FIN: [id number]               Age:   84 years     Sex:  Male     DOB:  [birth year]   Associated Diagnoses:   None   Author:   Lorraine Lax NP, [place] Citizen      Visit Information   Patient is seen under the supervision of Dr. Kem Kays.      Chief Complaint   metastatic prostate cancer    History of Presenting Illness:  This is a 84 year old male with history of a flutter, GERD, hyperlipidemia and hypertension now here for consultation regarding his new diagnosis of metastatic prostate cancer.  He is accompanied by his 2 daughters today     Briefly, he was recently admitted to the hospital s/p fall down the stairs, resulting in a back injury.  Extensive work-up including CT head, CT spine and MRI spine was done.  It revealed evidence of metastatic disease to the back and a mild compression fracture of L2.  There was no evidence of spinal cord compression.  PSA was checked and it returned high at 245.  He was treated conservatively for his fracture and was started on pain meds.  He was discharged to rehab where he currently resides.    Of note, he was given a prescription for bicalutamide at discharge. He started taking it today. He has an appt with his urologist Dr. [name] next week, at which time he is scheduled to receive a Lupron shot.         Interval History   Since his last visit here the patient has had his first injection of Lupron. He states he has had some night sweats but is feeling overall better. He did have the kyphoplasty to his back and is using oxycodone with good relief. He denies any new pains. He also states he started on the Xgeva injections as well. Denies fevers, chills, headache, shortness of breath.     Of note: he wishes to return home to [facility] ([place]      Review of Systems   Constitutional:  Fatigue, Decreased activity.    Eye:  Negative.    Ear/Nose/Mouth/Throat:  Negative.    Respiratory:  Negative.    Cardiovascular:  Negative.     Gastrointestinal:  Negative.    Genitourinary:  Negative.    Hematology/Lymphatics:  Negative.    Endocrine:  Negative.    Immunologic:  Negative.    Musculoskeletal:  Negative.    Integumentary:  Negative.    Neurologic:  Alert and oriented X4.    Psychiatric:  Negative.    All other systems are negative      Health Status   Allergies:    Allergic Reactions (Selected)  NKA,    Allergies (1) Active Reaction  NKA None Documented     Problem list:    All Problems  Anxiety / 7253664403 / Confirmed  Atrial flutter / 4742595 / Confirmed  Back pain / 638756433 / Confirmed  SOB - Shortness of breath / 295188416 / Confirmed  Fracture of lumbar vertebrae / 606301601 / Confirmed  GERD - Gastro-esophageal reflux disease / 0932355732 / Confirmed  Hematoma / 2025427062 / Confirmed  Hyperlipidemia / 37628315 / Confirmed  HTN / 1761607371 / Confirmed  Arthritis / 0626948546 / Confirmed  Prostate  cancer / 1610960454 / Confirmed  Metastasis of malignant neoplasm to bone / 098119147 / Confirmed,    Active Problems (12)  Anxiety   Arthritis   Atrial flutter   Back pain   Fracture of lumbar vertebrae   GERD - Gastro-esophageal reflux disease   Hematoma   HTN   Hyperlipidemia   Metastasis of malignant neoplasm to bone   Prostate cancer   SOB - Shortness of breath         Histories   Past Medical History:    No active or resolved past medical history items have been selected or recorded.   Family History:    High blood pressure  Daughter  Daughter  Kidney transplant  Daughter     Procedure history:    repair wrist laceration.   Social History        Social & Psychosocial Habits    Home/Environment  08/14/2017  Lives with: Children    Comment: lives with daughter and brother has 7 children - 08/14/2017 11:49 - Margaretha Glassing RN, [name]    Nutrition/Health  08/14/2017  Type of diet: Regular    Substance Abuse  10/19/2015  Use: Never    Tobacco  10/19/2015  Use: Former smoker    Alcohol  10/19/2015  Use: None    Employment/School  08/14/2017   Status: Retired  .        Physical Examination   Vital Signs   09/15/2017 15:19  Temperature Oral 97.4 DegF    Peripheral Pulse Rate 52 bpm    Respiratory Rate 20 br/min    Blood Pressure Extremity Right arm    Systolic Blood Pressure 161 mmHg  HI    Diastolic Blood Pressure 78 mmHg    Mean Arterial Pressure 106 mmHg    SpO2 97 %    Oxygen Therapy Room air      Measurements from flowsheet : Measurements   09/15/2017 15:19  Height 170 cm    Type of Height Stated height    Weight 75 Kg    Type of Weight Stated weight    BSA 1.86 m2    Body Mass Index 25.95 Kg/m2      General:  Alert and oriented, No acute distress.    Eye:  Normal conjunctiva.    HENT:  Normocephalic, Oral mucosa is moist, No pharyngeal erythema.    Neck:  Supple, Non-tender, No lymphadenopathy.    Respiratory:  Lungs are clear to auscultation, Respirations are non-labored, Breath sounds are equal, Symmetrical chest wall expansion, No chest wall tenderness.    Cardiovascular:  Normal rate, Regular rhythm, No murmur, No gallop, Good pulses equal in all extremities, Normal peripheral perfusion, No edema.    Integumentary:  Warm, Dry, Intact.    Neurologic:  Alert, Oriented.    Cognition and Speech:  Oriented, Speech clear and coherent.    Psychiatric:  Cooperative, Appropriate mood & affect.       Review / Management   Results review:  Lab results   09/09/2017 08:18  PT 10.9 sec    INR 1.1  NA    aPTT 26 sec   09/09/2017 07:02  Glucose Lvl 78 mg/dL    BUN 17 mg/dL    Creatinine 8.295 mg/dL  HI    Afn Amer Glomerular Filtration Rate 52 ml/min/1.30m2  NA    Non-Afn Amer Glomerular Filtration Rate 45 ml/min/1.21m2  NA    Sodium Lvl 143 mmol/L    Potassium Lvl 4.9 mmol/L  Chloride 113 mmol/L  HI    CO2 24 mmol/L    Anion Gap 6    Calcium Lvl 7.3 mg/dL  LOW    WBC 5.8 thous/mm3    RBC 3.86 Mil/mm3  LOW    Hgb 12.4 Gm/dL  LOW    Hct 16.1 %  LOW    Platelet 155 thous/mm3    MCV 99.0 fL    MCH 32.1 pGm    MCHC 32.5 Gm/dL    RDW-SD 09.6 fL    MPV 10.5 fL     Absolute Neutro Count 2.76 thous/mm3    Absolute Lymphs Count 1.74 thous/mm3    Absolute Mono Count 0.63 thous/mm3    Absolute Eos Count 0.59 thous/mm3  HI    Absolute Baso Count 0.04 thous/mm3    Neutrophils 47.8 %  NA    Lymphocytes 30.2 %  NA    Monocytes 10.9 %  NA    Eosinophils 10.2 %  NA    Basophils 0.7 %  NA    Immature Granulocytes 0.2 %    NRBC Percent 0.0 %    Absolute NRBC Count 0.00 thous/mm3  NA       Impression and Plan   This is an 84 year old male with a new diagnosis of stage IV prostate cancer, 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     Imaging thus far as well as the high PSA which indicates the diagnosis of stage IV prostate cancer.  He was recommended to have staging scans, CT chest/abdomen/pelvis and bone scan but he canceled these secondary to the cost.  He was started on bicalutamide and then received his first dose of Lupron as few days later.  He  also started [facility] for his bony mets. It is a monthly dose. He tolerated both these injections well. He does have hot flashes from the Lupron. He will continue.      The bicalutamide can be discontinued and patient will be maintained on Lupron alone until progression.      For his pain, he will continue on fentanyl patch and oxycodone as needed.  He was referred to RadOnc to see if there was a role for palliative XRT. He ultimately went for  kyphoplasty to his spine.     I will trend his PSA monthly.      Patient is agreeable to plan.  He will return for follow-up in 8 weeks.   He knows to call with questions/concerns anytime.                  Reviewed and electronically verified by:  Lorraine Lax [place] Citizen                                                                       on:  09/16/2017 14:38    Reviewed and electronically authenticated by:  Rosita Fire  on:  09/16/17 14:38

## 2017-09-24 ENCOUNTER — Ambulatory Visit: Admitting: Family

## 2017-09-24 LAB — HX CALCIUM LEVEL TOTAL
CASE NUMBER: 2019199001091
HX CALCIUM LVL: 7.7 mg/dL — ABNORMAL LOW (ref 8.5–10.5)

## 2017-09-24 LAB — HX CALCIUM LVL IONIZED
CASE NUMBER: 2019199001090
HX CALCIUM LVL IONIZED PH 7.4: 1.01 mmol/L — ABNORMAL LOW (ref 1.1–1.3)
HX CALCIUM LVL IONIZED: 1.12 mmol/L — NL (ref 1.1–1.3)
HX PH FOR IONIZED CA: 7.206 — ABNORMAL LOW (ref 7.35–7.45)

## 2017-09-24 LAB — HX PTH
CASE NUMBER: 2019199001091
HX PTH: 581.5 pg/mL — ABNORMAL HIGH (ref 14.0–72.0)

## 2017-09-24 LAB — HX MAGNESIUM LEVEL
CASE NUMBER: 2019199001091
HX MAGNESIUM LVL: 2.6 mg/dL — ABNORMAL HIGH (ref 1.7–2.5)

## 2017-09-29 ENCOUNTER — Ambulatory Visit: Admitting: Radiation Oncology

## 2017-09-29 NOTE — Other (Deleted)
[email or web address] Re-Assessment Entered On:  09/29/2017 15:23     Performed On:  09/29/2017 15:19  by Corrow RN, Beth               Vitals/Height/Weight   Temperature Temporal :   96.0 DegF (LOW)    Peripheral Pulse Rate :   53 bpm   Respiratory Rate :   20 br/min   Systolic Blood Pressure :   189 mmHg (>HHI)    Diastolic Blood Pressure :   88 mmHg   Mean Arterial Pressure :   122 mmHg   Weight :   75.6 Kg(Converted to: 166 lb 11 oz)    Type of Weight :   Standing   Type of Height :   Estimated height   Corrow RN, Beth - 09/29/2017 15:19    General Information   Chief Complaint :   f/u   Preferred Language :   English   Preferred Communication Mode :   Verbal   High Risk for Falls :   No   Cancer Fatigue Scale :   0-No fatigue   Cancer Fatigue Score :   0    Corrow RN, Beth - 09/29/2017 15:19    Patient Preferred Method of Communication   Phone Call   Infectious Disease Risk Screening   Patient Travel History :   No recent travel   Recent Family Travel History :   No recent travel   Public house manager, Beth - 09/29/2017 15:19    Subjective   Pain Symptoms :   Yes   Corrow RN, Beth - 09/29/2017 15:19    Primary Pain   Primary Pain Location :   Other: lower back    Pain Scale Used :   0-10   Pain Score :   3    Primary Pain Interventions :   No interventions at this time   Corrow RN, Beth - 09/29/2017 15:19    Image 1 -  Images currently included in the form version of this document have not been included in the text rendition version of the form.   Allergy   (As Of: 09/29/2017 15:23:42 )   Allergies (Active)   NKA  Estimated Onset Date:   Unspecified ; Created By:   Ronnie Doss; Reaction Status:   Active ; Category:   Drug ; Substance:   NKA ; Type:   Allergy ; Updated By:   Ronnie Doss; Reviewed Date:   09/29/2017 15:21         Medication History   Medication List   (As Of: 09/29/2017 15:23:42 )   Home Meds    cholecalciferol  :   cholecalciferol ; Status:   Documented ; Ordered As Mnemonic:   Vitamin D3 ;  Simple Display Line:   1,000 Int_Unit, PO, Daily, 0 Refill(s) ; Catalog Code:   cholecalciferol ; Order Dt/Tm:   09/04/2017 14:19:46          codeine-guaifenesin  :   codeine-guaifenesin ; Status:   Documented ; Ordered As Mnemonic:   Robitussin AC ; Simple Display Line:   10 mL, PO, q4hr, 10ml twice a day as needed, 0 Refill(s) ; Catalog Code:   codeine-guaifenesin ; Order Dt/Tm:   08/14/2017 11:47:36          docusate  :   docusate ; Status:   Documented ; Ordered As Mnemonic:   docusate sodium 100 mg oral capsule ; Simple Display Line:   100 mg, 1 cap(s),  PO, BID, PRN: Constipation, 0 Refill(s) ; Ordering Provider:   Susette Racer MD, Collene Leyden; Catalog Code:   docusate ; Order Dt/Tm:   07/22/2017 08:43:25          polyethylene glycol 3350  :   polyethylene glycol 3350 ; Status:   Documented ; Ordered As Mnemonic:   MiraLax oral powder for reconstitution ; Simple Display Line:   17 Gm, PO, Daily, PRN: Constipation, 0 Refill(s) ; Catalog Code:   polyethylene glycol 3350 ; Order Dt/Tm:   08/14/2017 11:47:11          loperamide  :   loperamide ; Status:   Documented ; Ordered As Mnemonic:   loperamide 2 mg oral tablet ; Simple Display Line:   2 mg, 1 tab(s), PO, q6hr, as needed, 0 Refill(s) ; Catalog Code:   loperamide ; Order Dt/Tm:   08/14/2017 11:38:26          omeprazole  :   omeprazole ; Status:   Documented ; Ordered As Mnemonic:   omeprazole ; Simple Display Line:   20 mg, PO, Daily, 0 Refill(s) ; Catalog Code:   omeprazole ; Order Dt/Tm:   08/14/2017 11:45:57          fentaNYL  :   fentaNYL ; Status:   Documented ; Ordered As Mnemonic:   fentaNYL ; Simple Display Line:   25 mcg, q72hr, change q 3 days fentayl 25 mgPartial fill upon request, 0 Refill(s) ; Catalog Code:   fentaNYL ; Order Dt/Tm:   08/14/2017 11:39:32          metoprolol  :   metoprolol ; Status:   Documented ; Ordered As Mnemonic:   metoprolol succinate 25 mg oral capsule, extended release ; Simple Display Line:   25 mg, 1 cap(s), PO, Daily, 0  Refill(s) ; Catalog Code:   metoprolol ; Order Dt/Tm:   08/14/2017 11:45:01          Al hydroxide/Mg hydroxide/simethicone  :   Al hydroxide/Mg hydroxide/simethicone ; Status:   Documented ; Ordered As Mnemonic:   aluminum hydroxide/magnesium hydroxide/simethicone 200 mg-200 mg-20 mg/5 mL oral suspension ; Simple Display Line:   30 mL, PO, q4hr, PRN: Dyspepsia, 0 Refill(s) ; Ordering Provider:   Susette Racer MD, Collene Leyden; Catalog Code:   Al hydroxide/Mg hydroxide/simethicone ; Order Dt/Tm:   07/22/2017 08:43:18          gabapentin  :   gabapentin ; Status:   Documented ; Ordered As Mnemonic:   gabapentin 300 mg oral capsule ; Simple Display Line:   300 mg, 1 cap(s), PO, BID, 0 Refill(s) ; Catalog Code:   gabapentin ; Order Dt/Tm:   07/12/2017 18:01:00          lisinopril  :   lisinopril ; Status:   Documented ; Ordered As Mnemonic:   lisinopril 5 mg oral tablet ; Simple Display Line:   5 mg, 1 tab(s), PO, Daily, 0 Refill(s) ; Ordering Provider:   Susette Racer MD, Collene Leyden; Catalog Code:   lisinopril ; Order Dt/Tm:   07/22/2017 08:43:40          bicalutamide  :   bicalutamide ; Status:   Documented ; Ordered As Mnemonic:   bicalutamide 50 mg oral tablet ; Simple Display Line:   50 mg, 1 tab(s), PO, q24hr, 0 Refill(s) ; Catalog Code:   bicalutamide ; Order Dt/Tm:   08/14/2017 11:37:59          acetaminophen  :   acetaminophen ;  Status:   Documented ; Ordered As Mnemonic:   acetaminophen 325 mg oral tablet ; Simple Display Line:   650 mg, 2 tab(s), PO, q4hr, PRN: Fever/Pain, Mild to Moderate, 0 Refill(s) ; Ordering Provider:   Susette Racer MD, Collene Leyden; Catalog Code:   acetaminophen ; Order Dt/Tm:   07/22/2017 08:43:08          aspirin  :   aspirin ; Status:   Documented ; Ordered As Mnemonic:   aspirin ; Simple Display Line:   81 mg, Chewed, Daily, 0 Refill(s) ; Catalog Code:   aspirin ; Order Dt/Tm:   07/18/2017 08:40:53          ondansetron  :   ondansetron ; Status:   Documented ; Ordered As Mnemonic:   Zofran 4 mg oral tablet ;  Simple Display Line:   See Instructions, 1 tab(s) PO q8hr PRN, 0 Refill(s) ; Catalog Code:   ondansetron ; Order Dt/Tm:   08/14/2017 11:46:16          ALPRAZolam  :   ALPRAZolam ; Status:   Documented ; Ordered As Mnemonic:   Xanax ; Simple Display Line:   See Instructions, 0.25 mg PO TID PRN, PRN: as needed for anxiety, 0 Refill(s) ; Catalog Code:   ALPRAZolam ; Order Dt/Tm:   09/07/2015 07:07:23            Social History   Current Smoking Status :   Former smoker   Did the patient smoke (past 12 months) :   No tobacco smoking   Patient used other tobacco products in the last 30 days? :   No   Corrow RN, Beth - 09/29/2017 15:19    Social History   (As Of: 09/29/2017 15:23:42 )   Tobacco:        Former smoker   (Last Updated: 10/19/2015 07:51:16  by Konrad Penta RN, Johnny Bridge)          Alcohol:        None   (Last Updated: 10/19/2015 07:51:23  by Konrad Penta RN, Johnny Bridge)          Employment/School:        Retired   (Last Updated: 08/14/2017 11:40:49  by Margaretha Glassing RN, [name])          Nutrition/Health:        Regular   (Last Updated: 08/14/2017 11:41:02  by Margaretha Glassing RN, [name])          Substance Abuse:        Never   (Last Updated: 10/19/2015 07:51:50  by Konrad Penta RN, Johnny Bridge)          Home/Environment:        Lives with Children.   Comments:  08/14/2017 11:49 - Margaretha Glassing RN, [name]: lives with daughter and brother has 7 children   (Last Updated: 08/14/2017 11:49:19  by Margaretha Glassing RN, [name])            ONC Nutrition   MST Patient Able to Complete Assessment :   Yes   MST Score :   0    MST Lose Weight Without Trying :   No   Appetite :   Fair   Public house manager, Beth - 09/29/2017 15:19    Advance Directive   Advanced Directives :   Yes   Advance Directive Type :   Health Care Proxy   Corrow RN, Beth - 09/29/2017 15:19    CCA Encounter   Onc Type of Patient :   Outpatient Visit Existing  Onc Visit Level, Existing :   No charge   Public house manager, Beth - 09/29/2017 15:19

## 2017-09-29 NOTE — Other (Addendum)
Radiation Oncology Re-Assessment Entered On:  09/29/2017 15:23     Performed On:  09/29/2017 15:19  by Corrow RN, Beth               Vitals/Height/Weight   Temperature Temporal :   96.0 DegF (LOW)    Peripheral Pulse Rate :   53 bpm   Respiratory Rate :   20 br/min   Systolic Blood Pressure :   189 mmHg (>HHI)    Diastolic Blood Pressure :   88 mmHg   Mean Arterial Pressure :   122 mmHg   Weight :   75.6 Kg(Converted to: 166 lb 11 oz)    Type of Weight :   Standing   Type of Height :   Estimated height   Corrow RN, Beth - 09/29/2017 15:19    General Information   Chief Complaint :   f/u   Preferred Language :   English   Preferred Communication Mode :   Verbal   High Risk for Falls :   No   Cancer Fatigue Scale :   0-No fatigue   Cancer Fatigue Score :   0    Corrow RN, Beth - 09/29/2017 15:19    Patient Preferred Method of Communication   Phone Call   Infectious Disease Risk Screening   Patient Travel History :   No recent travel   Recent Family Travel History :   No recent travel   Public house manager, Beth - 09/29/2017 15:19    Subjective   Pain Symptoms :   Yes   Corrow RN, Beth - 09/29/2017 15:19    Primary Pain   Primary Pain Location :   Other: lower back    Pain Scale Used :   0-10   Pain Score :   3    Primary Pain Interventions :   No interventions at this time   Corrow RN, Beth - 09/29/2017 15:19    Image 1 -  Images currently included in the form version of this document have not been included in the text rendition version of the form.   Allergy   (As Of: 09/29/2017 15:23:42 )   Allergies (Active)   NKA  Estimated Onset Date:   Unspecified ; Created By:   Ronnie Doss; Reaction Status:   Active ; Category:   Drug ; Substance:   NKA ; Type:   Allergy ; Updated By:   Ronnie Doss; Reviewed Date:   09/29/2017 15:21         Medication History   Medication List   (As Of: 09/29/2017 15:23:42 )   Home Meds    cholecalciferol  :   cholecalciferol ; Status:   Documented ; Ordered As Mnemonic:   Vitamin D3 ; Simple  Display Line:   1,000 Int_Unit, PO, Daily, 0 Refill(s) ; Catalog Code:   cholecalciferol ; Order Dt/Tm:   09/04/2017 14:19:46          codeine-guaifenesin  :   codeine-guaifenesin ; Status:   Documented ; Ordered As Mnemonic:   Robitussin AC ; Simple Display Line:   10 mL, PO, q4hr, 10ml twice a day as needed, 0 Refill(s) ; Catalog Code:   codeine-guaifenesin ; Order Dt/Tm:   08/14/2017 11:47:36          docusate  :   docusate ; Status:   Documented ; Ordered As Mnemonic:   docusate sodium 100 mg oral capsule ; Simple Display Line:   100 mg, 1 cap(s),  PO, BID, PRN: Constipation, 0 Refill(s) ; Ordering Provider:   Susette Racer MD, Collene Leyden; Catalog Code:   docusate ; Order Dt/Tm:   07/22/2017 08:43:25          polyethylene glycol 3350  :   polyethylene glycol 3350 ; Status:   Documented ; Ordered As Mnemonic:   MiraLax oral powder for reconstitution ; Simple Display Line:   17 Gm, PO, Daily, PRN: Constipation, 0 Refill(s) ; Catalog Code:   polyethylene glycol 3350 ; Order Dt/Tm:   08/14/2017 11:47:11          loperamide  :   loperamide ; Status:   Documented ; Ordered As Mnemonic:   loperamide 2 mg oral tablet ; Simple Display Line:   2 mg, 1 tab(s), PO, q6hr, as needed, 0 Refill(s) ; Catalog Code:   loperamide ; Order Dt/Tm:   08/14/2017 11:38:26          omeprazole  :   omeprazole ; Status:   Documented ; Ordered As Mnemonic:   omeprazole ; Simple Display Line:   20 mg, PO, Daily, 0 Refill(s) ; Catalog Code:   omeprazole ; Order Dt/Tm:   08/14/2017 11:45:57          fentaNYL  :   fentaNYL ; Status:   Documented ; Ordered As Mnemonic:   fentaNYL ; Simple Display Line:   25 mcg, q72hr, change q 3 days fentayl 25 mgPartial fill upon request, 0 Refill(s) ; Catalog Code:   fentaNYL ; Order Dt/Tm:   08/14/2017 11:39:32          metoprolol  :   metoprolol ; Status:   Documented ; Ordered As Mnemonic:   metoprolol succinate 25 mg oral capsule, extended release ; Simple Display Line:   25 mg, 1 cap(s), PO, Daily, 0 Refill(s) ;  Catalog Code:   metoprolol ; Order Dt/Tm:   08/14/2017 11:45:01          Al hydroxide/Mg hydroxide/simethicone  :   Al hydroxide/Mg hydroxide/simethicone ; Status:   Documented ; Ordered As Mnemonic:   aluminum hydroxide/magnesium hydroxide/simethicone 200 mg-200 mg-20 mg/5 mL oral suspension ; Simple Display Line:   30 mL, PO, q4hr, PRN: Dyspepsia, 0 Refill(s) ; Ordering Provider:   Susette Racer MD, Collene Leyden; Catalog Code:   Al hydroxide/Mg hydroxide/simethicone ; Order Dt/Tm:   07/22/2017 08:43:18          gabapentin  :   gabapentin ; Status:   Documented ; Ordered As Mnemonic:   gabapentin 300 mg oral capsule ; Simple Display Line:   300 mg, 1 cap(s), PO, BID, 0 Refill(s) ; Catalog Code:   gabapentin ; Order Dt/Tm:   07/12/2017 18:01:00          lisinopril  :   lisinopril ; Status:   Documented ; Ordered As Mnemonic:   lisinopril 5 mg oral tablet ; Simple Display Line:   5 mg, 1 tab(s), PO, Daily, 0 Refill(s) ; Ordering Provider:   Susette Racer MD, Collene Leyden; Catalog Code:   lisinopril ; Order Dt/Tm:   07/22/2017 08:43:40          bicalutamide  :   bicalutamide ; Status:   Documented ; Ordered As Mnemonic:   bicalutamide 50 mg oral tablet ; Simple Display Line:   50 mg, 1 tab(s), PO, q24hr, 0 Refill(s) ; Catalog Code:   bicalutamide ; Order Dt/Tm:   08/14/2017 11:37:59          acetaminophen  :   acetaminophen ;  Status:   Documented ; Ordered As Mnemonic:   acetaminophen 325 mg oral tablet ; Simple Display Line:   650 mg, 2 tab(s), PO, q4hr, PRN: Fever/Pain, Mild to Moderate, 0 Refill(s) ; Ordering Provider:   Susette Racer MD, Collene Leyden; Catalog Code:   acetaminophen ; Order Dt/Tm:   07/22/2017 08:43:08          aspirin  :   aspirin ; Status:   Documented ; Ordered As Mnemonic:   aspirin ; Simple Display Line:   81 mg, Chewed, Daily, 0 Refill(s) ; Catalog Code:   aspirin ; Order Dt/Tm:   07/18/2017 08:40:53          ondansetron  :   ondansetron ; Status:   Documented ; Ordered As Mnemonic:   Zofran 4 mg oral tablet ; Simple Display  Line:   See Instructions, 1 tab(s) PO q8hr PRN, 0 Refill(s) ; Catalog Code:   ondansetron ; Order Dt/Tm:   08/14/2017 11:46:16          ALPRAZolam  :   ALPRAZolam ; Status:   Documented ; Ordered As Mnemonic:   Xanax ; Simple Display Line:   See Instructions, 0.25 mg PO TID PRN, PRN: as needed for anxiety, 0 Refill(s) ; Catalog Code:   ALPRAZolam ; Order Dt/Tm:   09/07/2015 07:07:23            Social History   Current Smoking Status :   Former smoker   Did the patient smoke (past 12 months) :   No tobacco smoking   Patient used other tobacco products in the last 30 days? :   No   Corrow RN, Beth - 09/29/2017 15:19    Social History   (As Of: 09/29/2017 15:23:42 )   Tobacco:        Former smoker   (Last Updated: 10/19/2015 07:51:16  by Konrad Penta RN, Johnny Bridge)          Alcohol:        None   (Last Updated: 10/19/2015 07:51:23  by Konrad Penta RN, Johnny Bridge)          Employment/School:        Retired   (Last Updated: 08/14/2017 11:40:49  by Margaretha Glassing RN, [name])          Nutrition/Health:        Regular   (Last Updated: 08/14/2017 11:41:02  by Margaretha Glassing RN, [name])          Substance Abuse:        Never   (Last Updated: 10/19/2015 07:51:50  by Konrad Penta RN, Johnny Bridge)          Home/Environment:        Lives with Children.   Comments:  08/14/2017 11:49 - Margaretha Glassing RN, [name]: lives with daughter and brother has 7 children   (Last Updated: 08/14/2017 11:49:19  by Margaretha Glassing RN, [name])            ONC Nutrition   MST Patient Able to Complete Assessment :   Yes   MST Score :   0    MST Lose Weight Without Trying :   No   Appetite :   Fair   Public house manager, Beth - 09/29/2017 15:19    Advance Directive   Advanced Directives :   Yes   Advance Directive Type :   Health Care Proxy   Corrow RN, Beth - 09/29/2017 15:19    CCA Encounter   Onc Type of Patient :   Outpatient Visit Existing  Onc Visit Level, Existing :   No charge   Public house manager, Beth - 09/29/2017 15:19

## 2017-10-01 LAB — HX CALCIUM LVL IONIZED
CASE NUMBER: 2019206001896
HX CALCIUM LVL IONIZED PH 7.4: 1.04 mmol/L — ABNORMAL LOW (ref 1.1–1.3)
HX CALCIUM LVL IONIZED: 1.14 mmol/L — NL (ref 1.1–1.3)
HX PH FOR IONIZED CA: 7.239 — ABNORMAL LOW (ref 7.35–7.45)

## 2017-10-01 LAB — HX CALCIUM LEVEL TOTAL
CASE NUMBER: 2019206001897
HX CALCIUM LVL: 8.2 mg/dL — ABNORMAL LOW (ref 8.5–10.5)

## 2017-10-01 LAB — HX PTH
CASE NUMBER: 2019206001897
HX PTH: 390.1 pg/mL — ABNORMAL HIGH (ref 14.0–72.0)

## 2017-10-01 LAB — HX CREATININE LEVEL
CASE NUMBER: 18277366
HX CREATININE: 1.45 mg/dL — ABNORMAL HIGH (ref 0.55–1.3)

## 2017-10-01 LAB — HX GLOMERULAR FILTRATION RATE (ESTIMATED)
CASE NUMBER: 2019206001897
HX AFN AMER GLOMERULAR FILTRATION RATE: 51 mL/min/{1.73_m2}
HX NON-AFN AMER GLOMERULAR FILTRATION RATE: 44 mL/min/{1.73_m2}

## 2017-10-01 LAB — HX PHOSPHORUS LEVEL
CASE NUMBER: 18277366
HX PHOSPHORUS: 2.1 mg/dL — ABNORMAL LOW (ref 2.4–4.9)

## 2017-10-01 LAB — HX MAGNESIUM LEVEL
CASE NUMBER: 2019206001897
HX MAGNESIUM LVL: 2.2 mg/dL — NL (ref 1.7–2.5)

## 2017-10-01 LAB — HX ALBUMIN LEVEL
CASE NUMBER: 18277366
HX ALBUMIN LVL: 3.7 g/dL — NL (ref 3.2–5.0)

## 2017-10-14 ENCOUNTER — Ambulatory Visit: Admitting: Diagnostic Radiology

## 2017-10-16 DIAGNOSIS — E663 Overweight: Secondary | ICD-10-CM | POA: Insufficient documentation

## 2017-10-16 NOTE — Progress Notes (Deleted)
MEDICARE ANNUAL WELLNESS VISIT AND FOLLOW UP Assessment:    Diagnoses and all orders for this visit:  Encounter for Medicare annual wellness exam  Nonischemic cardiomyopathy ([place]) Control blood pressure, cholesterol, glucose, increase exercise.  Followed by Dr. Percival Spanish  Essential hypertension Continue medication Monitor blood pressure at home; call if consistently over 130/80 Continue DASH diet.   Reminder to go to the ER if any CP, SOB, nausea, dizziness, severe HA, changes vision/speech, left arm numbness and tingling and jaw pain.  Paroxysmal atrial fibrillation (HCC) Controlled on pacerone; continue ASA  Osteoarthritis of lumbar spine, unspecified spinal osteoarthritis complication status ***  CKD (chronic kidney disease) stage 3, GFR 30-59 ml/min (HCC) Increase fluids, avoid NSAIDS, monitor sugars, will monitor  Vitamin D deficiency Continue supplementation Check vitamin D level  Prediabetes Discussed disease and risks Discussed diet/exercise, weight management  A1C  Mixed hyperlipidemia Not on meds due to mild elevation and age Continue low cholesterol diet and exercise.  Defer lipid panel   Medication management CBC, CMP/GFR  Depression, major, in remission ([place]) Continue medications  Lifestyle discussed: diet/exerise, sleep hygiene, stress management, hydration  Overweight (BMI 25.0-29.9) Long discussion about weight loss, diet, and exercise Recommended diet heavy in fruits and veggies and low in animal meats, cheeses, and dairy products, appropriate calorie intake Discussed appropriate weight for height  Follow up at next visit    Over 30 minutes of exam, counseling, chart review, and critical decision making was performed  Future Appointments  Date Time Provider [place]  10/19/2017 11:15 AM Liane Comber, NP [facility] None     Plan:   During the course of the visit the patient was educated and counseled about appropriate  screening and preventive services including:    Pneumococcal vaccine   Influenza vaccine  Prevnar 13  Td vaccine  Screening electrocardiogram  Colorectal cancer screening  Diabetes screening  Glaucoma screening  Nutrition counseling   Conditions/risks identified: BMI: Discussed weight loss, diet, and increase physical activity.  Increase physical activity: AHA recommends 150 minutes of physical activity a week.  Medications reviewed Diabetes is at goal, ACE/ARB therapy: No, Reason not on Ace Inhibitor/ARB therapy:  preDM Urinary Incontinence is not an issue: discussed non pharmacology and pharmacology options.  Fall risk: low- discussed PT, home fall assessment, medications.    Subjective:  Logan Brown is a 84 y.o. male who presents for Medicare Annual Wellness Visit and 3 month follow up for HTN, hyperlipidemia, prediabetes, and vitamin D Def.   BMI is There is no height or weight on file to calculate BMI., he {HAS HAS [id number]} been working on diet and exercise. Wt Readings from Last 3 Encounters:  06/01/17 173 lb 6.4 oz (78.7 kg)  05/18/17 175 lb (79.4 kg)  05/10/17 173 lb (78.5 kg)   His blood pressure has been controlled at home, today their BP is    BP Readings from Last 3 Encounters:  06/01/17 (!) 152/80  05/18/17 140/70  05/10/17 (!) 159/70   He is here 6 months out of the year and 6 month in [place], has a doctor there.  He does not workout. He denies chest pain, shortness of breath, dizziness.   He is not on cholesterol medication secondary to age and . His cholesterol is not at goal. The cholesterol last visit was:   Lab Results  Component Value Date   CHOL 203 (H) 06/01/2017   HDL 62 06/01/2017   LDLCALC 104 (H) 06/01/2017   TRIG 257 (H) 06/01/2017  CHOLHDL 3.3 06/01/2017   He has been working on diet and exercise for prediabetes, and denies paresthesia of the feet, polydipsia, polyuria and visual disturbances. Last A1C in the office was:  Lab  Results  Component Value Date   HGBA1C 5.9 (H) 06/01/2017   Last GFR   Lab Results  Component Value Date   GFRNONAA 47 (L) 06/01/2017   Patient is on Vitamin D supplement.   Lab Results  Component Value Date   VD25OH 39 06/01/2017        Medication Review: Current Outpatient Medications on File Prior to Visit  Medication Sig Dispense Refill  . ALPRAZolam (XANAX) 1 MG tablet Take 1 mg by mouth. Take 1/2 pill in the am and 1 pill qhs    . amiodarone (PACERONE) 200 MG tablet Take 200 mg by mouth daily.    [name] Kitchen aspirin EC 81 MG tablet Take 81 mg by mouth daily.    [name] Kitchen gabapentin (NEURONTIN) 300 MG capsule Take 1 capsule 3 x / day for back & leg pain 270 capsule 1  . labetalol (NORMODYNE) 100 MG tablet Take 100 mg by mouth 2 (two) times daily. Take 50mg  in the morning and 50mg  in the evening    . Multiple Vitamins-Minerals (CENTRUM SILVER ADULT 50+ PO) Take by mouth.    [name] Kitchen omeprazole (PRILOSEC) 20 MG capsule Take 20 mg by mouth daily.    . ondansetron (ZOFRAN) 4 MG tablet Take 1 tablet (4 mg total) by mouth daily as needed for nausea or vomiting. 30 tablet 1  . traMADol (ULTRAM) 50 MG tablet Take 1 tablet (50 mg total) by mouth every 6 (six) hours as needed. 15 tablet 0   No current facility-administered medications on file prior to visit.     Current Problems (verified) Patient Active Problem List   Diagnosis Date Noted  . Overweight (BMI 25.0-29.9) 10/16/2017  . Nonischemic cardiomyopathy ([place]) 03/27/2017  . Atrial fibrillation ([place]) 03/27/2017  . Osteoarthritis 02/24/2017  . CKD (chronic kidney disease) stage 3, GFR 30-59 ml/min (HCC) 10/16/2016  . Depression, major, in remission ([place]) 03/24/2016  . HTN (hypertension) 03/01/2015  . Mixed hyperlipidemia 03/01/2015  . Prediabetes 03/01/2015  . Vitamin D deficiency 03/01/2015  . Medication management 03/01/2015    Screening Tests Immunization History  Administered Date(s) Administered  . Influenza, High Dose Seasonal PF  02/19/2015, 01/08/2017   Preventative care: Last colonoscopy: Never, declines  Prior vaccinations: TD or Tdap: declines Influenza: 2018 Pneumococcal: declines Prevnar13:  declines Shingles/Zostavax: declines  WILL GET FROM PCP IN [place], DECLINES SHOTS  Names of Other Physician/Practitioners you currently use: 1. [facility] here for primary care 2. unknown, eye doctor, last visit 2-3 years ago 3. None, dentist, has dentures  Patient Care Team: [name], MD as PCP - General (Internal Medicine) Minus Breeding, MD as PCP - Cardiology (Cardiology)  Past Surgical History:  Procedure Laterality Date  . CATARACT EXTRACTION    . EYE SURGERY Bilateral    IOL/CE on Lt in 1998 and Rt in 2011.  [name] Kitchen PROSTATE SURGERY     No family history on file. Social History   Tobacco Use  . Smoking status: Former Smoker    Last attempt to quit: 03/14/1975    Years since quitting: 42.6  . Smokeless tobacco: Never Used  Substance Use Topics  . Alcohol use: No    Alcohol/week: 0.0 standard drinks    Comment: occ  . Drug use: No    MEDICARE WELLNESS  OBJECTIVES: Tobacco use: He does not smoke.  Patient is a former smoker. If yes, counseling given Alcohol Current alcohol use: social drinker Osteoporosis: dietary calcium and/or vitamin D deficiency, History of fracture in the past year: no Fall risk: Low Risk Diet: in general, a "healthy" diet   Physical activity:   Cardiac risk factors:   Depression/mood screen:   Depression screen [facility] 2/9 10/08/2016  Decreased Interest 0  Down, Depressed, Hopeless 0  PHQ - 2 Score 0  Altered sleeping -  Tired, decreased energy -  Change in appetite -  Feeling bad or failure about yourself  -  Trouble concentrating -  Moving slowly or fidgety/restless -  Suicidal thoughts -  PHQ-9 Score -  Difficult doing work/chores -    ADLs:  No flowsheet data found.   Cognitive Testing  Alert? Yes  Normal  Appearance?Yes  Oriented to person? Yes  Place? Yes   Time? Yes  Recall of three objects?  Yes  Can perform simple calculations? Yes  Displays appropriate judgment?Yes  Can read the correct time from a watch face?Yes  EOL planning:     Objective:   There were no vitals filed for this visit. There is no height or weight on file to calculate BMI.  General appearance: alert, no distress, WD/WN, male HEENT: normocephalic, sclerae anicteric, TMs pearly, nares patent, no discharge or erythema, pharynx normal Oral cavity: MMM, no lesions Neck: supple, no lymphadenopathy, no thyromegaly, no masses Heart: RRR, normal S1, S2, no murmurs Lungs: CTA bilaterally, no wheezes, rhonchi, or rales Abdomen: +bs, soft, non tender, non distended, no masses, no hepatomegaly, no splenomegaly Musculoskeletal: nontender, no swelling, no obvious deformity Extremities: no edema, no cyanosis, no clubbing Pulses: 2+ symmetric, upper and lower extremities, normal cap refill Neurological: alert, oriented x 3, CN2-12 intact, strength normal upper extremities and lower extremities, sensation normal throughout, DTRs 2+ throughout, no cerebellar signs, gait normal Psychiatric: normal affect, behavior normal, pleasant   Medicare Attestation I have personally reviewed: The patient's medical and social history Their use of alcohol, tobacco or illicit drugs Their current medications and supplements The patient's functional ability including ADLs,fall risks, home safety risks, cognitive, and hearing and visual impairment Diet and physical activities Evidence for depression or mood disorders  The patient's weight, height, BMI, and visual acuity have been recorded in the chart.  I have made referrals, counseling, and provided education to the patient based on review of the above and I have provided the patient with a written personalized care plan for preventive services.     Izora Ribas, NP   10/16/2017

## 2017-10-19 ENCOUNTER — Ambulatory Visit: Payer: Self-pay | Admitting: Adult Health

## 2017-10-29 ENCOUNTER — Ambulatory Visit: Admitting: Urology

## 2017-10-29 LAB — HX CREATININE LEVEL
CASE NUMBER: 2019234001849
HX CREATININE: 1.54 mg/dL — ABNORMAL HIGH (ref 0.55–1.3)

## 2017-10-29 LAB — HX PHOSPHORUS LEVEL
CASE NUMBER: 2019234001849
HX PHOSPHORUS: 2 mg/dL — ABNORMAL LOW (ref 2.4–4.9)

## 2017-10-29 LAB — HX CALCIUM LEVEL TOTAL
CASE NUMBER: 2019234001849
HX CALCIUM LVL: 8.1 mg/dL — ABNORMAL LOW (ref 8.5–10.5)

## 2017-10-29 LAB — HX MAGNESIUM LEVEL
CASE NUMBER: 2019234001849
HX MAGNESIUM LVL: 2.2 mg/dL — NL (ref 1.7–2.5)

## 2017-10-29 LAB — HX ALBUMIN LEVEL
CASE NUMBER: 2019234001849
HX ALBUMIN LVL: 3.8 g/dL — NL (ref 3.2–5.0)

## 2017-10-29 LAB — HX GLOMERULAR FILTRATION RATE (ESTIMATED)
CASE NUMBER: 2019234001849
HX AFN AMER GLOMERULAR FILTRATION RATE: 47 mL/min/{1.73_m2}
HX NON-AFN AMER GLOMERULAR FILTRATION RATE: 41 mL/min/{1.73_m2}

## 2017-10-31 LAB — HX PROSTATE SPECIFIC ANTIGEN (PSA) FREE AND
CASE NUMBER: 2019234001849
HX PSA FREE: 0.04 ng/mL
HX PSA TOTAL: 0.1 ng/mL

## 2017-11-05 LAB — HX PHOSPHORUS LEVEL
CASE NUMBER: 2019241002203
HX PHOSPHORUS: 2.1 mg/dL — ABNORMAL LOW (ref 2.4–4.9)

## 2017-11-05 LAB — HX MAGNESIUM LEVEL
CASE NUMBER: 2019241002203
HX MAGNESIUM LVL: 2.2 mg/dL — NL (ref 1.7–2.5)

## 2017-11-05 LAB — HX CALCIUM LEVEL TOTAL
CASE NUMBER: 2019241002203
HX CALCIUM LVL: 8.4 mg/dL — ABNORMAL LOW (ref 8.5–10.5)

## 2017-11-05 LAB — HX ALBUMIN LEVEL
CASE NUMBER: 2019241002203
HX ALBUMIN LVL: 3.6 g/dL — NL (ref 3.2–5.0)

## 2017-11-10 ENCOUNTER — Other Ambulatory Visit: Payer: Self-pay | Admitting: Internal Medicine

## 2017-11-10 ENCOUNTER — Ambulatory Visit: Admitting: Internal Medicine

## 2017-11-10 NOTE — Other (Addendum)
Patient:    [name], [name]            MRN: [id number]            FIN: [id number]               Age:   84 years     Sex:  Male     DOB:  01/04/1932   Associated Diagnoses:   None   Author:   Taleen Prosser MD, Shiloh Swopes      Chief Complaint: prostate cancer    Oncologic/hematologic History:  He was recently admitted to the hospital s/p fall down the stairs, resulting in a back injury.  Extensive work-up including CT head, CT spine and MRI spine was done.  It revealed evidence of metastatic disease to the back and a mild compression fracture of L2.  There was no evidence of spinal cord compression.  PSA was checked and it returned high at 245.  He was treated conservatively for his fracture and was started on pain meds.  He was then started on Lupron and Casodex, as well as Xgeva. Casodex was then discontinued and he remained on the other two. For his bony met in the back, he underwent kyphoplasty. Repeat PSA on 10/29/17 was 0.1.      Interval History:   Since his last visit here, he has been feeling well. He is tolerating treatment pretty well. He does endorse hot flashes but states they are manageable. Back pain is improved after kyphoplasty.    Of note, he will be going to [place] for the winter. His PCP is Dr. McKeown there.      ROS:  Constitutional: Negative  HEENT: Negative  Respiratory: Negative  Cardiovascular: Negative  Gastrointestinal: Negative  Genitourinary: Negative  Heme/Lymph: Negative  Endocrine: hot flashes  Immunologic: Negative  Musculoskeletal: back pain  Integumentary: Negative  Neurologic: Negative  Psychiatric: Negative  All other ROS: Negative       Allergies:  Allergies (1) Active Reaction  NKA None Documented  ALLERGIES         Home Meds:   Medication List     Active Medications         Documented             acetaminophen: 650 mg, 2 tab(s), PO, q4hr, PRN: Fever/Pain, Mild to               Moderate, 0 Refill(s).             Al hydroxide/Mg hydroxide/simethicone: 30 mL, PO, q4hr, PRN:                Dyspepsia, 0 Refill(s).             ALPRAZolam: See Instructions, 0.25 mg PO TID PRN, PRN: as needed for               anxiety, 0 Refill(s).             aspirin: 81 mg, Chewed, Daily, 0 Refill(s).             calcium carbonate: 600 mg, 1 tab(s), PO, Daily, 0 Refill(s).             cholecalciferol: 1,000 Int_Unit, PO, Daily, 0 Refill(s).             codeine-guaifenesin: 10 mL, PO, q4hr, 10ml twice a day as needed, 0               Refill(s).               docusate: 100 mg, 1 cap(s), PO, BID, PRN: Constipation, 0 Refill(s).             fentaNYL: 25 mcg, q72hr, change q 3 days fentayl 25 mgPartial fill               upon request, 0 Refill(s).             gabapentin: 300 mg, 1 cap(s), PO, BID, 0 Refill(s).             lisinopril: 5 mg, 1 tab(s), PO, Daily, 0 Refill(s).             loperamide: 2 mg, 1 tab(s), PO, q6hr, as needed, 0 Refill(s).             omeprazole: 20 mg, PO, Daily, 0 Refill(s).             ondansetron: See Instructions, 1 tab(s) PO q8hr PRN, 0 Refill(s).             oxyCODONE: 5 mg, PO, as needed, 0 Refill(s).             polyethylene glycol 3350: 17 Gm, PO, Daily, PRN: Constipation, 0               Refill(s).     Medications Inactivated in the Last 72 Hours         bicalutamide: 50 mg, 1 tab(s), PO, q24hr, 0 Refill(s).         metoprolol: 25 mg, 1 cap(s), PO, Daily, 0 Refill(s).        Physical Exam:  Vital signs:  Temperature 96.9  (13:13)  Systolic Blood Pressure 153  (13:13)  Diastolic Blood Pressure 85  (13:13)  Pulse 80  (13:13)  SpO2 97  (13:13)  Respiratory Rate 18  (13:13)    ECOG: 1-2  General: well-appearing elderly male, not in acute distress, in a wheelchair  Head and neck: normocephalic, no masses  Eyes, Ear and Throat: moist mucous membranes, extraocular muscles intact, anicteric sclera  Cardiovascular: regular rate and rhythm, no murmurs, gallops or rubs  Respiratory: clear to auscultation bilaterally, no wheezes or crackles  Abdomen: soft, nontender, nondistended, no  organomegaly  Extremities: warm, perfused, no edema        Labs:  10/29/17  PSA 0.1          Impression and Plan: This is an 84-year-old male with a new diagnosis of stage IV prostate cancer, 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     Imaging thus far as well as the high PSA which indicates the diagnosis of stage IV prostate cancer.  He was recommended to have staging scans, CT chest/abdomen/pelvis and bone scan but he canceled these secondary to the cost.  He was started on bicalutamide and then received his first dose of Lupron as few days later.  He  also started Xgeva for his bony mets. It is a monthly dose. He tolerated both these injections well. He does have hot flashes from the Lupron. He will continue. Encouraged him to take Ca/Vit D suppl daily. Will check bone density next year.     The bicalutamide was discontinued and patient will be maintained on Lupron alone until progression.      For his pain, he will continue on fentanyl patch and oxycodone as needed.  He was referred to RadOnc to see if there was a role for palliative XRT. He ultimately went for kyphoplasty to his spine.     Recent   PSA showed an excellent response to treatment, currently 0.1. Will continue to trend every 3 months.     I will send records to Dr. McKeown in [place] so patient can be set up to continue treatment down there during the winter months.      Patient is agreeable to plan.  He will return for follow-up in April when he is back in town.   He knows to call with questions/concerns anytime.                      Reviewed and electronically verified by:  Jaysa Kise MD, Iann Rodier                                                                     on:  11/10/2017 15:59Reviewed and electronically authenticated by:  Clarence Dunsmore                                                                       on:  11/10/17 15:59

## 2017-11-10 NOTE — Other (Deleted)
Patient:    JR, Davarious J            MRN: [id number]            FIN: [id number]               Age:   84 years     Sex:  Male     DOB:  01/04/1932   Associated Diagnoses:   None   Author:   Taleen Prosser MD, Shiloh Swopes      Chief Complaint: prostate cancer    Oncologic/hematologic History:  He was recently admitted to the hospital s/p fall down the stairs, resulting in a back injury.  Extensive work-up including CT head, CT spine and MRI spine was done.  It revealed evidence of metastatic disease to the back and a mild compression fracture of L2.  There was no evidence of spinal cord compression.  PSA was checked and it returned high at 245.  He was treated conservatively for his fracture and was started on pain meds.  He was then started on Lupron and Casodex, as well as Xgeva. Casodex was then discontinued and he remained on the other two. For his bony met in the back, he underwent kyphoplasty. Repeat PSA on 10/29/17 was 0.1.      Interval History:   Since his last visit here, he has been feeling well. He is tolerating treatment pretty well. He does endorse hot flashes but states they are manageable. Back pain is improved after kyphoplasty.    Of note, he will be going to [place] for the winter. His PCP is Dr. McKeown there.      ROS:  Constitutional: Negative  HEENT: Negative  Respiratory: Negative  Cardiovascular: Negative  Gastrointestinal: Negative  Genitourinary: Negative  Heme/Lymph: Negative  Endocrine: hot flashes  Immunologic: Negative  Musculoskeletal: back pain  Integumentary: Negative  Neurologic: Negative  Psychiatric: Negative  All other ROS: Negative       Allergies:  Allergies (1) Active Reaction  NKA None Documented  ALLERGIES         Home Meds:   Medication List     Active Medications         Documented             acetaminophen: 650 mg, 2 tab(s), PO, q4hr, PRN: Fever/Pain, Mild to               Moderate, 0 Refill(s).             Al hydroxide/Mg hydroxide/simethicone: 30 mL, PO, q4hr, PRN:                Dyspepsia, 0 Refill(s).             ALPRAZolam: See Instructions, 0.25 mg PO TID PRN, PRN: as needed for               anxiety, 0 Refill(s).             aspirin: 81 mg, Chewed, Daily, 0 Refill(s).             calcium carbonate: 600 mg, 1 tab(s), PO, Daily, 0 Refill(s).             cholecalciferol: 1,000 Int_Unit, PO, Daily, 0 Refill(s).             codeine-guaifenesin: 10 mL, PO, q4hr, 10ml twice a day as needed, 0               Refill(s).               docusate: 100 mg, 1 cap(s), PO, BID, PRN: Constipation, 0 Refill(s).             fentaNYL: 25 mcg, q72hr, change q 3 days fentayl 25 mgPartial fill               upon request, 0 Refill(s).             gabapentin: 300 mg, 1 cap(s), PO, BID, 0 Refill(s).             lisinopril: 5 mg, 1 tab(s), PO, Daily, 0 Refill(s).             loperamide: 2 mg, 1 tab(s), PO, q6hr, as needed, 0 Refill(s).             omeprazole: 20 mg, PO, Daily, 0 Refill(s).             ondansetron: See Instructions, 1 tab(s) PO q8hr PRN, 0 Refill(s).             oxyCODONE: 5 mg, PO, as needed, 0 Refill(s).             polyethylene glycol 3350: 17 Gm, PO, Daily, PRN: Constipation, 0               Refill(s).     Medications Inactivated in the Last 72 Hours         bicalutamide: 50 mg, 1 tab(s), PO, q24hr, 0 Refill(s).         metoprolol: 25 mg, 1 cap(s), PO, Daily, 0 Refill(s).        Physical Exam:  Vital signs:  Temperature 96.9  (13:13)  Systolic Blood Pressure 153  (13:13)  Diastolic Blood Pressure 85  (13:13)  Pulse 80  (13:13)  SpO2 97  (13:13)  Respiratory Rate 18  (13:13)    ECOG: 1-2  General: well-appearing elderly male, not in acute distress, in a wheelchair  Head and neck: normocephalic, no masses  Eyes, Ear and Throat: moist mucous membranes, extraocular muscles intact, anicteric sclera  Cardiovascular: regular rate and rhythm, no murmurs, gallops or rubs  Respiratory: clear to auscultation bilaterally, no wheezes or crackles  Abdomen: soft, nontender, nondistended, no  organomegaly  Extremities: warm, perfused, no edema        Labs:  10/29/17  PSA 0.1          Impression and Plan: This is an 84-year-old male with a new diagnosis of stage IV prostate cancer, 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     Imaging thus far as well as the high PSA which indicates the diagnosis of stage IV prostate cancer.  He was recommended to have staging scans, CT chest/abdomen/pelvis and bone scan but he canceled these secondary to the cost.  He was started on bicalutamide and then received his first dose of Lupron as few days later.  He  also started Xgeva for his bony mets. It is a monthly dose. He tolerated both these injections well. He does have hot flashes from the Lupron. He will continue. Encouraged him to take Ca/Vit D suppl daily. Will check bone density next year.     The bicalutamide was discontinued and patient will be maintained on Lupron alone until progression.      For his pain, he will continue on fentanyl patch and oxycodone as needed.  He was referred to [facility] to see if there was a role for palliative XRT. He ultimately went for kyphoplasty to his spine.     Recent   PSA showed an excellent response to treatment, currently 0.1. Will continue to trend every 3 months.     I will send records to Dr. McKeown in [place] so patient can be set up to continue treatment down there during the winter months.      Patient is agreeable to plan.  He will return for follow-up in April when he is back in town.   He knows to call with questions/concerns anytime.                      Reviewed and electronically verified by:  Jaysa Kise MD, Iann Rodier                                                                     on:  11/10/2017 15:59Reviewed and electronically authenticated by:  Clarence Dunsmore                                                                       on:  11/10/17 15:59

## 2017-11-10 NOTE — Other (Deleted)
[email or web address] Intake Entered On:  11/10/2017 13:13     Performed On:  11/10/2017 13:11  by [place]onia [place], Amanda               Vital Signs   Temperature Oral :   96.9 DegF   Peripheral Pulse Rate :   80 bpm   Respiratory Rate :   18 br/min   Systolic Blood Pressure :   153 mmHg (HI)    Diastolic Blood Pressure :   85 mmHg   Blood Pressure Extremity :   Right arm   Mean Arterial Pressure :   108 mmHg   SpO2 :   97 %   Sok1 [place], Amanda - 11/10/2017 13:11    Height/Weight   Height :   170 cm(Converted to: 5 foot 7 in, 66.93 in)    Body Surface Area :   1.87 m2   Weight :   75.5 Kg(Converted to: 166 lb 7 oz)    Body Mass Index :   26.12 Kg/m2   Type of Weight :   Standing   Type of Height :   Stated height   Sok1 [place], Amanda - 11/10/2017 13:11    CCA General Information/Subjective   Chief Complaint :   revisit    High Risk for Falls :   No   Preferred Language :   English   Preferred Communication Mode :   Verbal   Pain Symptoms :   No   Sok1 , [name] - 11/10/2017 13:11    Patient Preferred Method of Communication   Phone Call   [facility] Infectious Disease Risk Screening   Patient Travel History :   No recent travel   Recent Family Travel History :   No recent travel   [place] - 11/10/2017 13:11    Allergy   (As Of: 11/10/2017 13:13:00 )   Allergies (Active)   NKA  Estimated Onset Date:   Unspecified ; Created By:   Ronnie Doss; Reaction Status:   Active ; Category:   Drug ; Substance:   NKA ; Type:   Allergy ; Updated By:   Ronnie Doss; Reviewed Date:   11/10/2017 13:11         Medication History   Medication List   (As Of: 11/10/2017 13:13:00 )   Home Meds    calcium carbonate  :   calcium carbonate ; Status:   Documented ; Ordered As Mnemonic:   calcium (as carbonate) 600 mg oral tablet ; Simple Display Line:   600 mg, 1 tab(s), PO, Daily, 0 Refill(s) ; Catalog Code:   calcium carbonate ; Order Dt/Tm:   10/02/2017 17:18:41          oxyCODONE  :   oxyCODONE ; Status:   Documented ; Ordered As Mnemonic:    oxyCODONE ; Simple Display Line:   5 mg, PO, as needed, 0 Refill(s) ; Catalog Code:   oxyCODONE ; Order Dt/Tm:   09/29/2017 15:24:05          cholecalciferol  :   cholecalciferol ; Status:   Documented ; Ordered As Mnemonic:   Vitamin D3 ; Simple Display Line:   1,000 Int_Unit, PO, Daily, 0 Refill(s) ; Catalog Code:   cholecalciferol ; Order Dt/Tm:   09/04/2017 14:19:46          codeine-guaifenesin  :   codeine-guaifenesin ; Status:   Documented ; Ordered As Mnemonic:   Robitussin AC ; Simple Display Line:  10 mL, PO, q4hr, 10ml twice a day as needed, 0 Refill(s) ; Catalog Code:   codeine-guaifenesin ; Order Dt/Tm:   08/14/2017 11:47:36          polyethylene glycol 3350  :   polyethylene glycol 3350 ; Status:   Documented ; Ordered As Mnemonic:   MiraLax oral powder for reconstitution ; Simple Display Line:   17 Gm, PO, Daily, PRN: Constipation, 0 Refill(s) ; Catalog Code:   polyethylene glycol 3350 ; Order Dt/Tm:   08/14/2017 11:47:11          ondansetron  :   ondansetron ; Status:   Documented ; Ordered As Mnemonic:   Zofran 4 mg oral tablet ; Simple Display Line:   See Instructions, 1 tab(s) PO q8hr PRN, 0 Refill(s) ; Catalog Code:   ondansetron ; Order Dt/Tm:   08/14/2017 11:46:16          metoprolol  :   metoprolol ; Status:   Completed ; Ordered As Mnemonic:   metoprolol succinate 25 mg oral capsule, extended release ; Simple Display Line:   25 mg, 1 cap(s), PO, Daily, 0 Refill(s) ; Catalog Code:   metoprolol ; Order Dt/Tm:   08/14/2017 11:45:01          omeprazole  :   omeprazole ; Status:   Documented ; Ordered As Mnemonic:   omeprazole ; Simple Display Line:   20 mg, PO, Daily, 0 Refill(s) ; Catalog Code:   omeprazole ; Order Dt/Tm:   08/14/2017 11:45:57          fentaNYL  :   fentaNYL ; Status:   Documented ; Ordered As Mnemonic:   fentaNYL ; Simple Display Line:   25 mcg, q72hr, change q 3 days fentayl 25 mgPartial fill upon request, 0 Refill(s) ; Catalog Code:   fentaNYL ; Order Dt/Tm:   08/14/2017  11:39:32          loperamide  :   loperamide ; Status:   Documented ; Ordered As Mnemonic:   loperamide 2 mg oral tablet ; Simple Display Line:   2 mg, 1 tab(s), PO, q6hr, as needed, 0 Refill(s) ; Catalog Code:   loperamide ; Order Dt/Tm:   08/14/2017 11:38:26          bicalutamide  :   bicalutamide ; Status:   Completed ; Ordered As Mnemonic:   bicalutamide 50 mg oral tablet ; Simple Display Line:   50 mg, 1 tab(s), PO, q24hr, 0 Refill(s) ; Catalog Code:   bicalutamide ; Order Dt/Tm:   08/14/2017 11:37:59          acetaminophen  :   acetaminophen ; Status:   Documented ; Ordered As Mnemonic:   acetaminophen 325 mg oral tablet ; Simple Display Line:   650 mg, 2 tab(s), PO, q4hr, PRN: Fever/Pain, Mild to Moderate, 0 Refill(s) ; Ordering Provider:   Susette Racer MD, Collene Leyden; Catalog Code:   acetaminophen ; Order Dt/Tm:   07/22/2017 08:43:08          Al hydroxide/Mg hydroxide/simethicone  :   Al hydroxide/Mg hydroxide/simethicone ; Status:   Documented ; Ordered As Mnemonic:   aluminum hydroxide/magnesium hydroxide/simethicone 200 mg-200 mg-20 mg/5 mL oral suspension ; Simple Display Line:   30 mL, PO, q4hr, PRN: Dyspepsia, 0 Refill(s) ; Ordering Provider:   Susette Racer MD, Collene Leyden; Catalog Code:   Al hydroxide/Mg hydroxide/simethicone ; Order Dt/Tm:   07/22/2017 08:43:18          docusate  :  docusate ; Status:   Documented ; Ordered As Mnemonic:   docusate sodium 100 mg oral capsule ; Simple Display Line:   100 mg, 1 cap(s), PO, BID, PRN: Constipation, 0 Refill(s) ; Ordering Provider:   Susette Racer MD, Collene Leyden; Catalog Code:   docusate ; Order Dt/Tm:   07/22/2017 08:43:25          lisinopril  :   lisinopril ; Status:   Documented ; Ordered As Mnemonic:   lisinopril 5 mg oral tablet ; Simple Display Line:   5 mg, 1 tab(s), PO, Daily, 0 Refill(s) ; Ordering Provider:   Susette Racer MD, Collene Leyden; Catalog Code:   lisinopril ; Order Dt/Tm:   07/22/2017 08:43:40          aspirin  :   aspirin ; Status:   Documented ; Ordered As Mnemonic:    aspirin ; Simple Display Line:   81 mg, Chewed, Daily, 0 Refill(s) ; Catalog Code:   aspirin ; Order Dt/Tm:   07/18/2017 08:40:53          gabapentin  :   gabapentin ; Status:   Documented ; Ordered As Mnemonic:   gabapentin 300 mg oral capsule ; Simple Display Line:   300 mg, 1 cap(s), PO, BID, 0 Refill(s) ; Catalog Code:   gabapentin ; Order Dt/Tm:   07/12/2017 18:01:00          ALPRAZolam  :   ALPRAZolam ; Status:   Documented ; Ordered As Mnemonic:   Xanax ; Simple Display Line:   See Instructions, 0.25 mg PO TID PRN, PRN: as needed for anxiety, 0 Refill(s) ; Catalog Code:   ALPRAZolam ; Order Dt/Tm:   09/07/2015 07:07:23            CCA Social History   Cigarette Smoking Last 365 Days :   No   Exposure to Tobacco Smoke :   Former smoker   Patient used other tobacco products in the last 30 days? :   No   Sok1 [place], [name] - 11/10/2017 13:11    Social History   (As Of: 11/10/2017 13:13:00 )   Tobacco:        Former smoker   (Last Updated: 10/19/2015 07:51:16  by Konrad Penta RN, [place])          Alcohol:        None   (Last Updated: 10/19/2015 07:51:23  by Konrad Penta RN, Johnny Bridge)          Employment/School:        Retired   (Last Updated: 08/14/2017 11:40:49  by Margaretha Glassing RN, [name])          Nutrition/Health:        Regular   (Last Updated: 08/14/2017 11:41:02  by Margaretha Glassing RN, [name])          Substance Abuse:        Never   (Last Updated: 10/19/2015 07:51:50  by Konrad Penta RN, Johnny Bridge)          Home/Environment:        Lives with Children.   Comments:  08/14/2017 11:49 - Margaretha Glassing RN, [name]: lives with daughter and brother has 7 children   (Last Updated: 08/14/2017 11:49:19  by Margaretha Glassing RN, [name])            Advance Directive   Advanced Directives :   Yes   August Luz - 11/10/2017 13:11    CCA Encounter   Onc Type of Patient :   Outpatient Visit Existing  Onc Visit Level, Existing :   Level 1   Sok1 [place], Amanda - 11/10/2017 13:11

## 2017-11-10 NOTE — Other (Addendum)
[facility] Nurse[facility] Intake Entered On:  11/10/2017 13:13     Performed On:  11/10/2017 13:11  by [name], [name]               Vital Signs   Temperature Oral :   96.9 DegF   Peripheral Pulse Rate :   80 bpm   Respiratory Rate :   18 br/min   Systolic Blood Pressure :   153 mmHg (HI)    Diastolic Blood Pressure :   85 mmHg   Blood Pressure Extremity :   Right arm   Mean Arterial Pressure :   108 mmHg   SpO2 :   97 %   Sok1 [place], [name] - 11/10/2017 13:11    Height/Weight   Height :   170 cm(Converted to: 5 foot 7 in, 66.93 in)    Body Surface Area :   1.87 m2   Weight :   75.5 Kg(Converted to: 166 lb 7 oz)    Body Mass Index :   26.12 Kg/m2   Type of Weight :   Standing   Type of Height :   Stated height   Sok1 [place], [name] - 11/10/2017 13:11    CCA General Information/Subjective   Chief Complaint :   revisit    High Risk for Falls :   No   Preferred Language :   English   Preferred Communication Mode :   Verbal   Pain Symptoms :   No   Sok1 [place], [name] - 11/10/2017 13:11    Patient Preferred Method of Communication   Phone Call   [facility] Infectious Disease Risk Screening   Patient Travel History :   No recent travel   Recent Family Travel History :   No recent travel   [name] - 11/10/2017 13:11    Allergy   (As Of: 11/10/2017 13:13:00 )   Allergies (Active)   NKA  Estimated Onset Date:   Unspecified ; Created By:   Ronnie Doss; Reaction Status:   Active ; Category:   Drug ; Substance:   NKA ; Type:   Allergy ; Updated By:   Ronnie Doss; Reviewed Date:   11/10/2017 13:11         Medication History   Medication List   (As Of: 11/10/2017 13:13:00 )   Home Meds    calcium carbonate  :   calcium carbonate ; Status:   Documented ; Ordered As Mnemonic:   calcium (as carbonate) 600 mg oral tablet ; Simple Display Line:   600 mg, 1 tab(s), PO, Daily, 0 Refill(s) ; Catalog Code:   calcium carbonate ; Order Dt/Tm:   10/02/2017 17:18:41          oxyCODONE  :   oxyCODONE ; Status:   Documented ; Ordered As Mnemonic:   oxyCODONE  ; Simple Display Line:   5 mg, PO, as needed, 0 Refill(s) ; Catalog Code:   oxyCODONE ; Order Dt/Tm:   09/29/2017 15:24:05          cholecalciferol  :   cholecalciferol ; Status:   Documented ; Ordered As Mnemonic:   Vitamin D3 ; Simple Display Line:   1,000 Int_Unit, PO, Daily, 0 Refill(s) ; Catalog Code:   cholecalciferol ; Order Dt/Tm:   09/04/2017 14:19:46          codeine-guaifenesin  :   codeine-guaifenesin ; Status:   Documented ; Ordered As Mnemonic:   Robitussin AC ; Simple Display Line:  10 mL, PO, q4hr, 10ml twice a day as needed, 0 Refill(s) ; Catalog Code:   codeine-guaifenesin ; Order Dt/Tm:   08/14/2017 11:47:36          polyethylene glycol 3350  :   polyethylene glycol 3350 ; Status:   Documented ; Ordered As Mnemonic:   MiraLax oral powder for reconstitution ; Simple Display Line:   17 Gm, PO, Daily, PRN: Constipation, 0 Refill(s) ; Catalog Code:   polyethylene glycol 3350 ; Order Dt/Tm:   08/14/2017 11:47:11          ondansetron  :   ondansetron ; Status:   Documented ; Ordered As Mnemonic:   Zofran 4 mg oral tablet ; Simple Display Line:   See Instructions, 1 tab(s) PO q8hr PRN, 0 Refill(s) ; Catalog Code:   ondansetron ; Order Dt/Tm:   08/14/2017 11:46:16          metoprolol  :   metoprolol ; Status:   Completed ; Ordered As Mnemonic:   metoprolol succinate 25 mg oral capsule, extended release ; Simple Display Line:   25 mg, 1 cap(s), PO, Daily, 0 Refill(s) ; Catalog Code:   metoprolol ; Order Dt/Tm:   08/14/2017 11:45:01          omeprazole  :   omeprazole ; Status:   Documented ; Ordered As Mnemonic:   omeprazole ; Simple Display Line:   20 mg, PO, Daily, 0 Refill(s) ; Catalog Code:   omeprazole ; Order Dt/Tm:   08/14/2017 11:45:57          fentaNYL  :   fentaNYL ; Status:   Documented ; Ordered As Mnemonic:   fentaNYL ; Simple Display Line:   25 mcg, q72hr, change q 3 days fentayl 25 mgPartial fill upon request, 0 Refill(s) ; Catalog Code:   fentaNYL ; Order Dt/Tm:   08/14/2017 11:39:32           loperamide  :   loperamide ; Status:   Documented ; Ordered As Mnemonic:   loperamide 2 mg oral tablet ; Simple Display Line:   2 mg, 1 tab(s), PO, q6hr, as needed, 0 Refill(s) ; Catalog Code:   loperamide ; Order Dt/Tm:   08/14/2017 11:38:26          bicalutamide  :   bicalutamide ; Status:   Completed ; Ordered As Mnemonic:   bicalutamide 50 mg oral tablet ; Simple Display Line:   50 mg, 1 tab(s), PO, q24hr, 0 Refill(s) ; Catalog Code:   bicalutamide ; Order Dt/Tm:   08/14/2017 11:37:59          acetaminophen  :   acetaminophen ; Status:   Documented ; Ordered As Mnemonic:   acetaminophen 325 mg oral tablet ; Simple Display Line:   650 mg, 2 tab(s), PO, q4hr, PRN: Fever/Pain, Mild to Moderate, 0 Refill(s) ; Ordering Provider:   Susette Racer MD, Collene Leyden; Catalog Code:   acetaminophen ; Order Dt/Tm:   07/22/2017 08:43:08          Al hydroxide/Mg hydroxide/simethicone  :   Al hydroxide/Mg hydroxide/simethicone ; Status:   Documented ; Ordered As Mnemonic:   aluminum hydroxide/magnesium hydroxide/simethicone 200 mg-200 mg-20 mg/5 mL oral suspension ; Simple Display Line:   30 mL, PO, q4hr, PRN: Dyspepsia, 0 Refill(s) ; Ordering Provider:   Susette Racer MD, Collene Leyden; Catalog Code:   Al hydroxide/Mg hydroxide/simethicone ; Order Dt/Tm:   07/22/2017 08:43:18          docusate  :  docusate ; Status:   Documented ; Ordered As Mnemonic:   docusate sodium 100 mg oral capsule ; Simple Display Line:   100 mg, 1 cap(s), PO, BID, PRN: Constipation, 0 Refill(s) ; Ordering Provider:   Susette Racer MD, Collene Leyden; Catalog Code:   docusate ; Order Dt/Tm:   07/22/2017 08:43:25          lisinopril  :   lisinopril ; Status:   Documented ; Ordered As Mnemonic:   lisinopril 5 mg oral tablet ; Simple Display Line:   5 mg, 1 tab(s), PO, Daily, 0 Refill(s) ; Ordering Provider:   Susette Racer MD, Collene Leyden; Catalog Code:   lisinopril ; Order Dt/Tm:   07/22/2017 08:43:40          aspirin  :   aspirin ; Status:   Documented ; Ordered As Mnemonic:   aspirin ; Simple  Display Line:   81 mg, Chewed, Daily, 0 Refill(s) ; Catalog Code:   aspirin ; Order Dt/Tm:   07/18/2017 08:40:53          gabapentin  :   gabapentin ; Status:   Documented ; Ordered As Mnemonic:   gabapentin 300 mg oral capsule ; Simple Display Line:   300 mg, 1 cap(s), PO, BID, 0 Refill(s) ; Catalog Code:   gabapentin ; Order Dt/Tm:   07/12/2017 18:01:00          ALPRAZolam  :   ALPRAZolam ; Status:   Documented ; Ordered As Mnemonic:   Xanax ; Simple Display Line:   See Instructions, 0.25 mg PO TID PRN, PRN: as needed for anxiety, 0 Refill(s) ; Catalog Code:   ALPRAZolam ; Order Dt/Tm:   09/07/2015 07:07:23            CCA Social History   Cigarette Smoking Last 365 Days :   No   Exposure to Tobacco Smoke :   Former smoker   Patient used other tobacco products in the last 30 days? :   No   Sok1 [place], [name] - 11/10/2017 13:11    Social History   (As Of: 11/10/2017 13:13:00 )   Tobacco:        Former smoker   (Last Updated: 10/19/2015 07:51:16  by Konrad Penta RN, Johnny Bridge)          Alcohol:        None   (Last Updated: 10/19/2015 07:51:23  by Konrad Penta RN, Johnny Bridge)          Employment/School:        Retired   (Last Updated: 08/14/2017 11:40:49  by Margaretha Glassing RN, [name])          Nutrition/Health:        Regular   (Last Updated: 08/14/2017 11:41:02  by Margaretha Glassing RN, [name])          Substance Abuse:        Never   (Last Updated: 10/19/2015 07:51:50  by Konrad Penta RN, Johnny Bridge)          Home/Environment:        Lives with Children.   Comments:  08/14/2017 11:49 - Margaretha Glassing RN, [name]: lives with daughter and brother has 7 children   (Last Updated: 08/14/2017 11:49:19  by Margaretha Glassing RN, [name])            Advance Directive   Advanced Directives :   Yes   August Luz - 11/10/2017 13:11    CCA Encounter   Onc Type of Patient :   Outpatient Visit Existing  Onc Visit Level, Existing :   Level 1   Sok1 [place], [name] - 11/10/2017 13:11

## 2017-11-25 ENCOUNTER — Ambulatory Visit: Payer: Self-pay | Admitting: Adult Health

## 2017-11-30 ENCOUNTER — Ambulatory Visit

## 2017-12-03 LAB — HX GLOMERULAR FILTRATION RATE (ESTIMATED)
CASE NUMBER: 2019269002250
HX AFN AMER GLOMERULAR FILTRATION RATE: 40 mL/min/{1.73_m2}
HX NON-AFN AMER GLOMERULAR FILTRATION RATE: 35 mL/min/{1.73_m2}

## 2017-12-03 LAB — HX ALBUMIN LEVEL
CASE NUMBER: 2019269002250
HX ALBUMIN LVL: 3.8 g/dL — NL (ref 3.2–5.0)

## 2017-12-03 LAB — HX MAGNESIUM LEVEL
CASE NUMBER: 2019269002250
HX MAGNESIUM LVL: 2.3 mg/dL — NL (ref 1.7–2.5)

## 2017-12-03 LAB — HX CREATININE LEVEL
CASE NUMBER: 2019269002250
HX CREATININE: 1.76 mg/dL — ABNORMAL HIGH (ref 0.55–1.3)

## 2017-12-03 LAB — HX CALCIUM LEVEL TOTAL
CASE NUMBER: 2019269002250
HX CALCIUM LVL: 8 mg/dL — ABNORMAL LOW (ref 8.5–10.5)

## 2017-12-03 LAB — HX PHOSPHORUS LEVEL
CASE NUMBER: 2019269002250
HX PHOSPHORUS: 2 mg/dL — ABNORMAL LOW (ref 2.4–4.9)

## 2017-12-04 ENCOUNTER — Ambulatory Visit: Admitting: Urology

## 2017-12-04 NOTE — Progress Notes (Signed)
* * *        **  [name]    --- ---    8 Y old Male, DOB: 08/29/1931    [address] , [place], [place] [address]    Home: [phone]    Provider: [name]        * * *    Telephone Encounter    ---    Answered by   Karoline Caldwell  Date: 12/04/2017         Time: 02:32 PM    Reason   Urologist in [facility]    --- ---            Action Taken                      [facility]  12/04/2017 2:32:53 PM > Patient will be going down to [place] in a few weeks and staying there until April sometime. He will be due for a Lupron in January, then he can have the April Lupron if needed when he returns home. Dr. [name] wanted to make sure we got all the information on urologist he will be seeing in [place] so there's no gaps in his care or missed Lupron injection.  Here is the information:      Dr. Marinus Maw,       [address]      [place], [place] [address]   phone [phone]       Thank you [name]!      [name],[name]  12/07/2017 8:28:54 AM > notes sent                    * * *                ---          * * *          Patient: [name] DOB: 30-Aug-1931 Provider: [name]  12/04/2017    ---    Note generated by e[employer] EMR/PM Software ([email or web address])

## 2017-12-04 NOTE — Progress Notes (Signed)
* * *        **[name]    --- ---    57 Y old Male, DOB: 12-10-1931    Account Number: [id number]    [address] , [place], [place]    Home: [phone]    Guarantor: [name], [name]: [name] Medicare Complete Payer ID:  [id number]    PCP: Tyler Muskrat, MD    Appointment Facility: [facility]        * * *    12/04/2017  Progress Notes: [name]. [name], [name]    --- ---    ---         **Current Medications**    ---    Taking     * Omeprazole 20 MG Capsule Delayed Release 1 capsule Orally Once a day    ---    * Acetaminophen 500 MG Capsule 1 capsule as needed Orally every 6 hrs    ---    * Fentanyl 50 MCG/HR Patch 72 Hour 1 patch to skin Transdermal     ---    * Gabapentin 300 MG Capsule 1 capsule Orally Once a day    ---    * Lisinopril 5 MG Tablet 1 tablet Orally Once a day    ---    * Metoprolol Succinate ER 25 MG Tablet Extended Release 24 Hour 1 tablet Orally Once a day    ---    * MiraLax - Powder as directed Orally     ---    * Oxycodone HCl 5 MG/5ML Solution 5 ml as needed Orally every 6 hrs    ---    * Milk of Magnesia 400 MG/5ML Suspension 5 ml as needed Orally Four times a day    ---    * Bisacodyl 10 MG Suppository 1 suppository as needed Rectal Once a day    ---    * Aspirin 81 MG Tablet Delayed Release 1 tablet Orally Once a day    ---    * Calcium     ---    Not-Taking/PRN    * Casodex 50 MG Tablet 1 tablet Orally Once a day    ---    * Centrum Silver Tablet as directed Orally     ---    * Hydrochlorothiazide 25 MG Tablet 1 tablet Orally Once a day    ---    * Xanax 0.25 MG Tablet 1 tablet Orally Twice a day    ---    * Hydrocodone-Acetaminophen 5-325 MG Tablet 1 tablet as needed Orally every 6 hrs    ---    * Amiodarone HCl 200 MG Tablet 1 tablet Orally Once a day    ---    * Zestril 5 MG Tablet 1 tablet Orally Once a day    ---    * Zocor 10 MG Tablet 1 tablet every evening Orally Once a day    ---    * Xarelto 15 MG Tablet Orally     ---    *  Medication List reviewed and reconciled with the patient    ---      Past Medical History    ---       prostate cancer: Dx'd 7/06; S/P XRT: 11/06-1/07: gleason's 8 and then Dx'd  with met prostate cancer in 07/2017.        ---    GERD.        ---    Hypercholesterolemia.        ---  Hypertension.        ---    Anxiety.        ---    Atrial Fibrillation: @ 2017.        ---    ED - Impotence of organic origin.        ---    Renal insufficiency.        ---       **Surgical History**    ---       Trans Rectal [place] and Prostate BX 09/2004    ---    cholecystectomy 04/2004    ---    inguinal hernia repair    ---    cystoscopy: c/w rad changes 08/27/2016    ---    cardioversion    ---      **Family History**    ---       No Family History documented.    ---       **Social History**    ---    Alcohol: yes, occasional.    no Caffeine.    no Smoking status: never smoker.      **Allergies**    ---       N.K.D.A.    ---       **Hospitalization/Major Diagnostic Procedure**    ---       fall/compressionfx c/we met disease 5/11-5/15/19    ---       **Review of Systems**    ---     _General_ :    no Fever. no Chills. no Weakness. no Abdominal pain.    _Endocrine_ :    Patient complains of: not flashes.    _Gastroenterology_ :    no Constipation. no Diarrhea.    _Urology_ :    no Blood in urine. no Frequent urination. no Urinary incontinence.            **Reason for Appointment**    ---       1\. 3 mo follow up    ---    2\. Prostate cancer follow up    ---       **History of Present Illness**    ---     _Urological History_ :    pt is voiding OK, mod stream, empties OK, no dysuria, double voids, no  hematuria: getting some hot flashes: can tolerate and manage it: on the Xgeva  injeciton via medical onc.       **Vital Signs**    ---    Ht 5'7", Wt 168, BMI 26.31, BP 126/74, Temp 98.1.       **Examination**    ---     _General_ :    General appearance:  A&O x 3, no acute distress, pleasant. HEENT  Head:  normocephalic, head: atraumatic,  no icterus. Lungs/Respirations:  No labored  breathing, good excursion. Abdomen:  normal. Musculoskeletal:  has a brace and  walks slowly. Skin:  normal turgor.    GU exam deferred.           **Assessments**    ---    1\. Prostate cancer - C61 (Primary)    ---       **Treatment**    ---       **1\. Prostate cancer**    Notes: I discussed this with the pt and his daughter. he is seeing med onc as  well and his recent PSA was negligible. He is "tolerating" the hot flashes and  I told him that if it gets worse  we can consider treating that but he is OK  with it. For now the biggest issue remains social: he will be going back down  to [place] soon and will need a doc down there ( they have one and he  is due for this next Lupron in early January and then if he comes back up here  he would need another one here in the spring. ) I told the daughter to stay in  touch with [place] so we know when he will be coming up and we can check the exact  date of his next Lupron. As far as the Xgeva:and calcium issues that will be  set up by the medical oncology dept. .    ---      **Therapeutic Injections**    ---       Lupron : 22.5 mg (Dose No:1) (Route: Intramuscular) given by [name] on left gluteus (Prostate cancer)    ---         **Labs**    --- ---    _Lab: ClinitekStatus SG_    ---       GLU  Negative     \-    --- --- --- ---    BIL  Negative     \-    --- --- --- ---    KET  Negative     \-    --- --- --- ---    SG  1.025     \-    --- --- --- ---    BLO  Trace-intact     \-    --- --- --- ---    pH  5.5     \-    --- --- --- ---    PRO  1+     \-    --- --- --- ---    URO  1.0 [name].U./dL     \-    --- --- --- ---    NIT  Negative     \-    --- --- --- ---    LEU  Negative     \-    --- --- --- ---         [name],[name] 12/04/2017 10:01:43 AM > [name] 12/04/2017  10:12:49 AM >    --- ---    [name]: Urine Microscopy_       WBC  0       --- --- --- ---    RBC  0-1       --- --- --- ---    Epithelial  0        --- --- --- ---    Bacteria  0       --- --- --- ---    Amorphous  trace       --- --- --- ---    Mucous  0       --- --- --- ---    Casts  0       --- --- --- ---    Other  0       --- --- --- ---         [name],[name] 12/04/2017 10:19:28 AM > Ralf Konopka [name] 12/04/2017  10:50:45 AM >    --- ---       **Procedure Codes**    ---       81003 UA Automated    ---    13086 MICROSCOPIC EXAM OF URINE    ---  V9563 LUPRON    ---       **Follow Up**    ---    depends on when he comes back from [facility]    Electronically signed by Leola Brazil , MD on 12/04/2017 at 12:45 PM EDT    Sign off status: Completed        * * [facility]    [address]    [place], [place] [id number]    Tel: [phone]    Fax: [phone]              * * *          Patient: [name], [name] DOB: Nov 26, 1931 Progress Note: [name].  [name], [name] 12/04/2017    ---    Note generated by eClinicalWorks EMR/PM Software ([email or web address])

## 2017-12-06 LAB — HX PROSTATE SPECIFIC ANTIGEN (PSA) FREE AND
CASE NUMBER: 2019269002250
HX PSA FREE: 0.02 ng/mL
HX PSA TOTAL: 0.1 ng/mL

## 2017-12-09 LAB — HX PHOSPHORUS LEVEL
CASE NUMBER: 2019275002230
HX PHOSPHORUS: 2.1 mg/dL — ABNORMAL LOW (ref 2.4–4.9)

## 2017-12-09 LAB — HX MAGNESIUM LEVEL
CASE NUMBER: 2019275002230
HX MAGNESIUM LVL: 2.2 mg/dL — NL (ref 1.7–2.5)

## 2017-12-09 LAB — HX GLOMERULAR FILTRATION RATE (ESTIMATED)
CASE NUMBER: 2019275002230
HX AFN AMER GLOMERULAR FILTRATION RATE: 48 mL/min/{1.73_m2}
HX NON-AFN AMER GLOMERULAR FILTRATION RATE: 41 mL/min/{1.73_m2}

## 2017-12-09 LAB — HX CALCIUM LEVEL TOTAL
CASE NUMBER: 2019275002230
HX CALCIUM LVL: 8.5 mg/dL — NL (ref 8.5–10.5)

## 2017-12-09 LAB — HX CREATININE LEVEL
CASE NUMBER: 2019275002230
HX CREATININE: 1.53 mg/dL — ABNORMAL HIGH (ref 0.55–1.3)

## 2017-12-09 LAB — HX ALBUMIN LEVEL
CASE NUMBER: 2019275002230
HX ALBUMIN LVL: 3.9 g/dL — NL (ref 3.2–5.0)

## 2017-12-11 LAB — HX PROSTATE SPECIFIC ANTIGEN (PSA) FREE AND
CASE NUMBER: 2019275002230
HX PSA FREE: 0.03 ng/mL
HX PSA TOTAL: <0.1

## 2018-01-12 ENCOUNTER — Encounter: Payer: Self-pay | Admitting: Adult Health Nurse Practitioner

## 2018-01-12 ENCOUNTER — Ambulatory Visit: Payer: Medicare Other | Admitting: Adult Health Nurse Practitioner

## 2018-01-12 VITALS — BP 148/94 | HR 67 | Temp 98.2°F | Resp 15 | Ht 67.0 in | Wt 169.5 lb

## 2018-01-12 DIAGNOSIS — N183 Chronic kidney disease, stage 3 unspecified: Secondary | ICD-10-CM

## 2018-01-12 DIAGNOSIS — R7309 Other abnormal glucose: Secondary | ICD-10-CM

## 2018-01-12 DIAGNOSIS — R6889 Other general symptoms and signs: Secondary | ICD-10-CM

## 2018-01-12 DIAGNOSIS — E782 Mixed hyperlipidemia: Secondary | ICD-10-CM | POA: Diagnosis not present

## 2018-01-12 DIAGNOSIS — L291 Pruritus scroti: Secondary | ICD-10-CM

## 2018-01-12 DIAGNOSIS — C61 Malignant neoplasm of prostate: Secondary | ICD-10-CM | POA: Insufficient documentation

## 2018-01-12 DIAGNOSIS — Z0001 Encounter for general adult medical examination with abnormal findings: Secondary | ICD-10-CM | POA: Diagnosis not present

## 2018-01-12 DIAGNOSIS — F419 Anxiety disorder, unspecified: Secondary | ICD-10-CM

## 2018-01-12 DIAGNOSIS — S32020D Wedge compression fracture of second lumbar vertebra, subsequent encounter for fracture with routine healing: Secondary | ICD-10-CM

## 2018-01-12 DIAGNOSIS — I10 Essential (primary) hypertension: Secondary | ICD-10-CM

## 2018-01-12 DIAGNOSIS — Z79899 Other long term (current) drug therapy: Secondary | ICD-10-CM

## 2018-01-12 DIAGNOSIS — I480 Paroxysmal atrial fibrillation: Secondary | ICD-10-CM

## 2018-01-12 DIAGNOSIS — E559 Vitamin D deficiency, unspecified: Secondary | ICD-10-CM

## 2018-01-12 DIAGNOSIS — K219 Gastro-esophageal reflux disease without esophagitis: Secondary | ICD-10-CM

## 2018-01-12 LAB — COMPLETE METABOLIC PANEL WITHOUT GFR
AG Ratio: 1.9 (calc) (ref 1.0–2.5)
Albumin: 4.3 g/dL (ref 3.6–5.1)
Alkaline phosphatase (APISO): 72 U/L (ref 40–115)
BUN/Creatinine Ratio: 15 (calc) (ref 6–22)
CO2: 25 mmol/L (ref 20–32)
Creat: 1.39 mg/dL — ABNORMAL HIGH (ref 0.70–1.11)
GFR, Est Non African American: 46 mL/min/1.73m2 — ABNORMAL LOW (ref >=60)
Globulin: 2.3 g/dL (ref 1.9–3.7)
Potassium: 4.9 mmol/L (ref 3.5–5.3)
Sodium: 142 mmol/L (ref 135–146)

## 2018-01-12 LAB — COMPLETE METABOLIC PANEL WITH GFR
ALT: 13 U/L (ref 9–46)
AST: 21 U/L (ref 10–35)
BUN: 21 mg/dL (ref 7–25)
Calcium: 8.8 mg/dL (ref 8.6–10.3)
Chloride: 110 mmol/L (ref 98–110)
GFR, Est African American: 54 mL/min/{1.73_m2} — ABNORMAL LOW (ref >=60)
Glucose, Bld: 122 mg/dL — ABNORMAL HIGH (ref 65–99)
Total Bilirubin: 0.6 mg/dL (ref 0.2–1.2)
Total Protein: 6.6 g/dL (ref 6.1–8.1)

## 2018-01-12 NOTE — Progress Notes (Addendum)
[name] Kitchen MEDICARE ANNUAL WELLNESS VISIT AND FOLLOW UP 50months Assessment:   Logan Brown was seen today for follow-up and medicare wellness.  Diagnoses and all orders for this visit:  Essential hypertension -     COMPLETE METABOLIC PANEL WITH GFR Continue labetalol 50mg  BID.  Mixed hyperlipidemia Reports labs just drawn in [place], will request records for status of this  Abnormal glucose -     COMPLETE METABOLIC PANEL WITH GFR  Vitamin D deficiency Continue Vit D supplementation  Paroxysmal atrial fibrillation (HCC) Taking amiodarone 200mg  daily Follows with cardiology in [place] and [place] (Dr Percival Spanish)  Hypocalcemia -     COMPLETE METABOLIC PANEL WITH GFR  CKD (chronic kidney disease) stage 3, GFR 30-59 ml/min (HCC) -     COMPLETE METABOLIC PANEL WITH GFR Increase fluids, avoid NSAIDS, monitor sugars, will monitor  Gastroesophageal reflux disease, esophagitis presence not specified Continue prilosec, diet discussed  Prostate cancer ([place]) -     COMPLETE METABOLIC PANEL WITH GFR -     Ambulatory referral to Urology for Lupron injections, next due in [name]. Also receiving Delton See, due now Nov.  May need oncology referral if Urology unable to manage. Also receiving calcium infusion with this related to hypokalemia from [place].  Medication management -     COMPLETE METABOLIC PANEL WITH GFR -     Ambulatory referral to Urology  Compression fracture of L2 lumbar vertebra, with routine healing, subsequent encounter Prescribed fentanyl patches 49mcg Q72 hours with Hydrocodone 5/325 PRN for pain management.  Reports this is helping with the pain. Continue use of lumbar support and utilizing cane as well for stabilization.  Scrotal itching Unremarkable assessment today Nystatin and hydroxazine PRN Discussed hydroxazine with patient related to effects in combination with other medications. Requested records from Primary in [place].  Over 30 minutes of exam, counseling, chart review, and critical decision  making was performed  Future Appointments  Date Time Provider [place]  04/20/2018  3:30 PM [name], MD [facility] None  01/18/2019  2:00 PM Liane Comber, NP [facility] None     Plan:   During the course of the visit the patient was educated and counseled about appropriate screening and preventive services including:    Pneumococcal vaccine   Influenza vaccine  Prevnar 13  Td vaccine  Screening electrocardiogram  Colorectal cancer screening  Diabetes screening  Glaucoma screening  Nutrition counseling    Subjective:  Logan Brown is a 84 y.o. male who presents for Medicare Annual Wellness Visit and 3 month follow up for HTN, hyperlipidemia, prediabetes, Atrial fib, hypocalcemia, CKD, GERD, vitamin D Def, prostate cancer with spinal mets, and L2 compression fracture.  He lives here 6 months out of the year and 6 months in [place].  There is has Primary Care provider, Cardiologist, Urologist and Oncologist.  He is receiving Lupron injections every 3 months and the next is Due in Janurary 2020 which he will be in [place], [place] during this time.  He is also receiving Xgeva infusions along with calcium replacememt monthly, he is due now, Novenmber for this. He had a fall while walking up the stairs from a lower level.  He had clothes in his hands and slipped and fell.  He had an elevauation with imaging which resulted in L2 compression fx along with Mets to spine from Prostate cx.  His Oncologist assisted with pain controll and most recently was switched to Fentanyl patches 64mcg and Hydrocodone for pain mangement.  He reports the patches have been working  to decrease his pain although it is still there.  He utilizes the Hydrocone PRN and takes at least one a day. He is looking to establish care here in [place] for these health conditions and management.  His blood pressure has been controlled at home, today their BP is BP: (!) 148/94 He does  not workout. He denies chest pain, shortness of breath, dizziness.  He is not on cholesterol medication and denies . His cholesterol is not at goal. The cholesterol last visit was:   Lab Results  Component Value Date   CHOL 203 (H) 06/01/2017   HDL 62 06/01/2017   LDLCALC 104 (H) 06/01/2017   TRIG 257 (H) 06/01/2017   CHOLHDL 3.3 06/01/2017   He has not been working on diet and exercise for prediabetes, and denies hyperglycemia, hypoglycemia , polydipsia and polyuria. Last A1C in the office was:  Lab Results  Component Value Date   HGBA1C 5.9 (H) 06/01/2017   Last GFR Lab Results  Component Value Date   GFRNONAA 46 (L) 01/12/2018     Lab Results  Component Value Date   GFRAA 54 (L) 01/12/2018   Patient is on Vitamin D supplement.   Lab Results  Component Value Date   VD25OH 39 06/01/2017      Medication Review:   Current Outpatient Medications (Cardiovascular):  .  amiodarone (PACERONE) 200 MG tablet, Take 200 mg by mouth daily. [name] Kitchen  labetalol (NORMODYNE) 100 MG tablet, Take 100 mg by mouth 2 (two) times daily. Take 50mg  in the morning and 50mg  in the evening .  simvastatin (ZOCOR) 20 MG tablet, simvastatin 20 mg tablet   Current Outpatient Medications (Analgesics):  .  aspirin EC 81 MG tablet, Take 81 mg by mouth daily. .  fentaNYL (DURAGESIC - DOSED MCG/HR) 25 MCG/HR patch,    Current Outpatient Medications (Other):  [name] Kitchen  ALPRAZolam (XANAX) 1 MG tablet, Take 1 mg by mouth. Take 1/2 pill in the am and 1 pill qhs .  Cholecalciferol (VITAMIN D3) 25 MCG (1000 UT) CAPS, Vitamin D3 1,000 unit capsule  Take by oral route. .  gabapentin (NEURONTIN) 300 MG capsule, Take 1 capsule 3 x / day for back & leg pain .  hydrOXYzine (ATARAX/VISTARIL) 25 MG tablet,  .  Influenza vac split quadrivalent PF (FLUZONE HIGH-DOSE) 0.5 ML injection, Fluzone High-Dose 2015-16 (PF) 180 mcg/0.5 mL intramuscular syringe .  Multiple Vitamins-Minerals (CENTRUM SILVER ADULT 50+ PO), Take by mouth. .   nystatin ointment (MYCOSTATIN),  .  omeprazole (PRILOSEC) 20 MG capsule, Take 20 mg by mouth daily. .  ondansetron (ZOFRAN) 4 MG tablet, Take 1 tablet (4 mg total) by mouth daily as needed for nausea or vomiting.  Allergies: Allergies  Allergen Reactions  . Ace Inhibitors Swelling    angioedema    Current Problems (verified) has HTN (hypertension); Mixed hyperlipidemia; Prediabetes; Vitamin D deficiency; Medication management; Depression, major, in remission ([place]); CKD (chronic kidney disease) stage 3, GFR 30-59 ml/min ([place]); Osteoarthritis; Nonischemic cardiomyopathy ([place]); Atrial fibrillation ([place]); Overweight (BMI 25.0-29.9); Prostate cancer ([place]); Hypocalcemia; Compression fracture of L2 lumbar vertebra, with routine healing, subsequent encounter; and Scrotal itching on their problem list.  Screening Tests Immunization History  Administered Date(s) Administered  . Influenza, High Dose Seasonal PF 02/19/2015, 01/08/2017    Preventative care: Last colonoscopy:Never, Declines  Prior vaccinations: TD or Tdap:11/2017  Influenza: 11/2017 Pneumococcal: unsure no records Prevnar13: 11/2017  Shingles/Zostavax: Due  Names of Other Physician/Practitioners you currently use: 1. [facility]  here for primary care 2. Dr Katy Fitch, eye doctor, last visit 2019 3. Dentures, does do see dentist. Dentures fit well.  Patient Care Team: [name], MD as PCP - General (Internal Medicine) Minus Breeding, MD as PCP - Cardiology (Cardiology)    Surgical: He  has a past surgical history that includes Eye surgery (Bilateral); Cataract extraction; and Prostate surgery. Family His family history is not on file. Social history  He reports that he quit smoking about 42 years ago. He has never used smokeless tobacco. He reports that he does not drink alcohol or use drugs.  MEDICARE WELLNESS OBJECTIVES: Physical activity: Current Exercise Habits: The patient  does not participate in regular exercise at present, Exercise limited by: orthopedic condition(s) Cardiac risk factors: Cardiac Risk Factors include: advanced age (>51men, >4 women);dyslipidemia;male gender;hypertension Depression/mood screen:   Depression screen [facility] 2/9 01/13/2018  Decreased Interest 0  Down, Depressed, Hopeless 0  PHQ - 2 Score 0  Altered sleeping -  Tired, decreased energy -  Change in appetite -  Feeling bad or failure about yourself  -  Trouble concentrating -  Moving slowly or fidgety/restless -  Suicidal thoughts -  PHQ-9 Score -  Difficult doing work/chores -    ADLs:  In your present state of health, do you have any difficulty performing the following activities: 01/13/2018  Hearing? N  Vision? N  Difficulty concentrating or making decisions? N  Walking or climbing stairs? Y  Dressing or bathing? N  Doing errands, shopping? Y  Preparing Food and eating ? N  Using the Toilet? N  In the past six months, have you accidently leaked urine? N  Do you have problems with loss of bowel control? N  Managing your Medications? N  Managing your Finances? N  Housekeeping or managing your Housekeeping? N  Some recent data might be hidden     Cognitive Testing  Alert? Yes  Normal Appearance?Yes  Oriented to person? Yes  Place? Yes   Time? Yes  Recall of three objects?  Yes  Can perform simple calculations? Yes  Displays appropriate judgment?Yes  Can read the correct time from a watch face?Yes  EOL planning: Does Patient Have a Medical Advance Directive?: Yes Type of Advance Directive: Living will Does patient want to make changes to medical advance directive?: No - Patient declined Would patient like information on creating a medical advance directive?: Yes (MAU/Ambulatory/Procedural Areas - Information given)   Objective:   Today's Vitals   01/12/18 1400  BP: (!) 148/94  Pulse: 67  Resp: 15  Temp: 98.2 F (36.8 C)  SpO2: 97%  Weight: 169 lb 8 oz  (76.9 kg)  Height: 5\' 7"  (1.702 m)   Body mass index is 26.55 kg/m.  Review of Systems  Constitutional: Negative for diaphoresis, malaise/fatigue and weight loss.  HENT: Negative for congestion, ear discharge, ear pain, hearing loss, nosebleeds, sinus pain, sore throat and tinnitus.   Eyes: Negative for blurred vision, double vision, photophobia, pain, discharge and redness.  Respiratory: Negative for cough, hemoptysis, sputum production, shortness of breath and wheezing.   Cardiovascular: Negative for chest pain, palpitations, orthopnea, claudication, leg swelling and PND.  Gastrointestinal: Negative for abdominal pain, blood in stool, constipation, diarrhea, heartburn, melena, nausea and vomiting.  Genitourinary: Negative for dysuria, flank pain, frequency, hematuria and urgency.  Musculoskeletal: Positive for back pain and falls. Negative for joint pain, myalgias and neck pain.  Skin: Positive for itching. Negative for rash.       Endorese itching  and burning to scrotal area.  Mostly after showering.  Reports he is using cream and pill, unsure of name.  Neurological: Negative for dizziness, tingling, tremors, sensory change, speech change, loss of consciousness, weakness and headaches.  Endo/Heme/Allergies: Negative for environmental allergies and polydipsia. Does not bruise/bleed easily.  Psychiatric/Behavioral: Negative for depression, hallucinations, memory loss, substance abuse and suicidal ideas. The patient is nervous/anxious. The patient does not have insomnia.     General appearance: alert, no distress, WD/WN, male HEENT: normocephalic, sclerae anicteric, TMs pearly, nares patent, no discharge or erythema, pharynx normal Oral cavity: MMM, no lesions Neck: supple, no lymphadenopathy, no thyromegaly, no masses Heart: RRR, normal S1, S2, no murmurs Lungs: CTA bilaterally, no wheezes, rhonchi, or rales Abdomen: +bs, soft, non tender, non distended, no masses, no hepatomegaly, no  splenomegaly Skin: no erythema rash or exudate noted to scrotal area. Musculoskeletal: nontender, no swelling, no obvious deformity Extremities: no edema, no cyanosis, no clubbing Pulses: 2+ symmetric, upper and lower extremities, normal cap refill Neurological: alert, oriented x 3, CN2-12 intact, strength normal upper extremities and lower extremities, sensation normal throughout, DTRs 2+ throughout, no cerebellar signs, gait normal Psychiatric: normal affect, behavior normal, pleasant   Medicare Attestation I have personally reviewed: The patient's medical and social history Their use of alcohol, tobacco or illicit drugs Their current medications and supplements The patient's functional ability including ADLs,fall risks, home safety risks, cognitive, and hearing and visual impairment Diet and physical activities Evidence for depression or mood disorders  The patient's weight, height, BMI, and visual acuity have been recorded in the chart.  I have made referrals, counseling, and provided education to the patient based on review of the above and I have provided the patient with a written personalized care plan for preventive services.     [name], NP   01/13/2018

## 2018-01-12 NOTE — Patient Instructions (Addendum)
We will do a referral for Urology for the Lupon injections (next due in [name]) and Xgeva infusion (Due Nov)  If urology does not handle toe Delton See we will do a referral for oncology for you.  Pain Management.  We will do a referral next door for this since you are on Fentanyl patches.  Today we will check a a CMP and call you with results  Follow up with [place] in three months.  If we need to see you before that time we will contact you.  We are requesting records from your Primary Care Physician, Dr Corwin Levins.

## 2018-01-13 ENCOUNTER — Other Ambulatory Visit: Payer: Self-pay | Admitting: Adult Health Nurse Practitioner

## 2018-01-13 ENCOUNTER — Encounter: Payer: Self-pay | Admitting: Adult Health Nurse Practitioner

## 2018-01-13 DIAGNOSIS — L291 Pruritus scroti: Secondary | ICD-10-CM | POA: Insufficient documentation

## 2018-01-13 DIAGNOSIS — S32020S Wedge compression fracture of second lumbar vertebra, sequela: Secondary | ICD-10-CM | POA: Insufficient documentation

## 2018-01-13 DIAGNOSIS — F419 Anxiety disorder, unspecified: Secondary | ICD-10-CM | POA: Insufficient documentation

## 2018-01-13 DIAGNOSIS — C61 Malignant neoplasm of prostate: Secondary | ICD-10-CM

## 2018-01-13 DIAGNOSIS — S32020G Wedge compression fracture of second lumbar vertebra, subsequent encounter for fracture with delayed healing: Secondary | ICD-10-CM

## 2018-01-14 ENCOUNTER — Telehealth: Payer: Self-pay | Admitting: Internal Medicine

## 2018-01-14 NOTE — Telephone Encounter (Signed)
Ginger and Josph Macho came into the office left with out being seen, then called to request pain patches for patient and status of referral to pain management. Advised patient that referral to [facility] has been completed, specialist will review the chart and advise. Renewals for pain patch should be requested from the prescribing provider.

## 2018-01-15 ENCOUNTER — Telehealth: Payer: Self-pay | Admitting: Internal Medicine

## 2018-01-15 ENCOUNTER — Other Ambulatory Visit: Payer: Self-pay | Admitting: Adult Health Nurse Practitioner

## 2018-01-15 DIAGNOSIS — L291 Pruritus scroti: Secondary | ICD-10-CM

## 2018-01-15 MED ORDER — HYDROXYZINE HCL 25 MG PO TABS: 25.0000 mg | ORAL_TABLET | Freq: Four times a day (QID) | ORAL | 1 refills | Status: DC | PRN

## 2018-01-15 MED ORDER — NYSTATIN 100000 UNIT/GM EX OINT: TOPICAL_OINTMENT | Freq: Two times a day (BID) | CUTANEOUS | 2 refills | Status: DC | PRN

## 2018-01-15 NOTE — Telephone Encounter (Signed)
[facility] called to advise patient scheduled, and notified of appointment. Patient states he will try to make it. AU advised that patient has 2 NO Shows previously. Must keep this appointment or reschedule, other wise chart will be closed. We will encourage patient prompt arrival for this urgent appointment.  Scheduled 01-21-18 @ 2pm Dr Rushie [place], 2nd Floor

## 2018-01-19 ENCOUNTER — Telehealth: Payer: Self-pay | Admitting: Internal Medicine

## 2018-01-19 ENCOUNTER — Other Ambulatory Visit: Payer: Self-pay | Admitting: Internal Medicine

## 2018-01-19 ENCOUNTER — Encounter: Payer: Self-pay | Admitting: Internal Medicine

## 2018-01-19 DIAGNOSIS — F419 Anxiety disorder, unspecified: Secondary | ICD-10-CM

## 2018-01-19 MED ORDER — BUSPIRONE HCL 10 MG PO TABS: ORAL_TABLET | ORAL | 5 refills | Status: DC

## 2018-01-19 NOTE — Telephone Encounter (Signed)
Patient requested refill of Alprazolam, per Dr [name], Alprazolam contraindicated while on Fentalyn patch. Will call a new medication for depression to [facility]. Patient aware of medication replacement and understands.

## 2018-01-19 NOTE — Telephone Encounter (Signed)
Yes, that sounds like a great idea.  Pallative care could potentially manage pain and coordination of care.  He may also be eligable for these services in [place].  They can help him to come up with goals related to the nature of his conditions.

## 2018-01-19 NOTE — Telephone Encounter (Signed)
Called to confirm [facility] appt 01-20-18, patient verified time and address.   Patient asked me to let Dr [name] and [name] know he recieved Fentanyl patch rx  from Onocologist in [place]., received by mail.

## 2018-01-20 ENCOUNTER — Other Ambulatory Visit: Payer: Self-pay | Admitting: Internal Medicine

## 2018-01-20 ENCOUNTER — Encounter: Payer: Self-pay | Admitting: Internal Medicine

## 2018-01-20 DIAGNOSIS — G8929 Other chronic pain: Secondary | ICD-10-CM

## 2018-01-20 DIAGNOSIS — M5441 Lumbago with sciatica, right side: Principal | ICD-10-CM

## 2018-01-20 MED ORDER — GABAPENTIN 600 MG PO TABS: ORAL_TABLET | ORAL | 2 refills | Status: DC

## 2018-02-02 ENCOUNTER — Other Ambulatory Visit: Payer: Self-pay | Admitting: Physician Assistant

## 2018-02-02 ENCOUNTER — Telehealth: Payer: Self-pay | Admitting: Oncology

## 2018-02-02 MED ORDER — ALPRAZOLAM 1 MG PO TABS: ORAL_TABLET | ORAL | 2 refills | Status: DC

## 2018-02-02 NOTE — Telephone Encounter (Signed)
New referral received from Dr. Lovena Neighbours at [facility] urology for metastatic prostate cancer. Pt has been cld and scheduled to see Dr. Alen Blew on 11/29 at 2pm. Pt aware to arrive 30 minutes early.

## 2018-02-05 ENCOUNTER — Telehealth: Payer: Self-pay

## 2018-02-05 ENCOUNTER — Ambulatory Visit: Payer: Medicare Other | Admitting: Oncology

## 2018-02-05 ENCOUNTER — Encounter: Payer: Self-pay | Admitting: Oncology

## 2018-02-05 ENCOUNTER — Inpatient Hospital Stay: Payer: Medicare Other | Attending: Oncology | Admitting: Oncology

## 2018-02-05 VITALS — BP 164/91 | HR 66 | Temp 98.1°F | Resp 18 | Wt 174.1 lb

## 2018-02-05 DIAGNOSIS — C61 Malignant neoplasm of prostate: Secondary | ICD-10-CM

## 2018-02-05 NOTE — Progress Notes (Signed)
Reason for the request:   Prostate cancer  HPI: I was asked by Dr. Lovena Neighbours to evaluate Logan Brown for prostate cancer.  He is an 84 year old man native of [place] and have relocated to [place].  He has history of arthritis, atrial fibrillation, renal insufficiency as well as cardiomyopathy and diagnosed with prostate cancer in the early 2000's.  At that time he had a radical prostatectomy followed by external beam radiation and remains disease free.  He presented with a fall and found to have PSA of 245 and multiple osseous lesions indicating metastatic disease to the bone in May 2019. At that time he was started with Casodex and Lupron and subsequently have been on Lupron alone.  He was also receiving Xgeva before relocating from [place] to this area.  He was evaluated by Dr. Lovena Neighbours and his PSA in August 2019 was 0.1.  He received a Lupron in November 2019 and due for a repeat in February 2020.  Bone scan obtained on Jul 20, 2017 showed multilevel bony metastatic disease most prominent in the sacrum, L1, L5 vertebral body.  Compression fracture was noted in L2.  He also had a kyphoplasty procedure at the time of diagnosis.  He has been splitting time between [place] and has permanently moved to this area and establish care with Dr. Lovena Neighbours from [facility]rology as well as [facility].  Clinically, he continues to have chronic pain related to his disease to the bone as well as degenerative arthritis.  He is currently on fentanyl patch which is adequately controlling his pain.  He also takes Neurontin as well as breakthrough Tylenol as needed.  He ambulates without any help of a walker or cane and denies any falls recently.  His appetite and performance status remains reasonable.  He does report hot flashes associated with his Lupron.  He does not report any headaches, blurry vision, syncope or seizures. Does not report any fevers, chills or sweats.  Does not  report any cough, wheezing or hemoptysis.  Does not report any chest pain, palpitation, orthopnea or leg edema.  Does not report any nausea, vomiting or abdominal pain.  Does not report any constipation or diarrhea.  Does not report any skeletal complaints.    Does not report frequency, urgency or hematuria.  Does not report any skin rashes or lesions. Does not report any heat or cold intolerance.  Does not report any lymphadenopathy or petechiae.  Does not report any anxiety or depression.  Remaining review of systems is negative.    Past Medical History:  Diagnosis Date  . Arthritis   . Atrial fibrillation ([place])   . Cardiomyopathy ([place])   . Hyperlipidemia   . Prostate cancer [facility])   :  Past Surgical History:  Procedure Laterality Date  . CATARACT EXTRACTION    . EYE SURGERY Bilateral    IOL/CE on Lt in 1998 and Rt in 2011.  [name] PROSTATE SURGERY    :   Current Outpatient Medications:  .  ALPRAZolam (XANAX) 1 MG tablet, 1/2-1 tablet BID PRN for anxiety, can interact with pain patch, will mail information., Disp: 60 tablet, Rfl: 2 .  amiodarone (PACERONE) 200 MG tablet, Take 200 mg by mouth daily., Disp: , Rfl:  .  aspirin EC 81 MG tablet, Take 81 mg by mouth daily., Disp: , Rfl:  .  Cholecalciferol (VITAMIN D3) 25 MCG (1000 UT) CAPS, Vitamin D3 1,000 unit capsule  Take by oral route., Disp: , Rfl:  .  fentaNYL (DURAGESIC - DOSED MCG/HR) 25 MCG/HR patch, , Disp: , Rfl:  .  gabapentin (NEURONTIN) 600 MG tablet, Take 1/2 to 1 tablet 2 to 3 x /day for chronic pain, Disp: 90 tablet, Rfl: 2 .  hydrOXYzine (ATARAX/VISTARIL) 25 MG tablet, Take 1 tablet (25 mg total) by mouth every 6 (six) hours as needed for itching., Disp: 30 tablet, Rfl: 1 .  Influenza vac split quadrivalent PF (FLUZONE HIGH-DOSE) 0.5 ML injection, Fluzone High-Dose 2015-16 (PF) 180 mcg/0.5 mL intramuscular syringe, Disp: , Rfl:  .  labetalol (NORMODYNE) 100 MG tablet, Take 100 mg by mouth 2 (two) times daily. Take 50mg  in the  morning and 50mg  in the evening, Disp: , Rfl:  .  Multiple Vitamins-Minerals (CENTRUM SILVER ADULT 50+ PO), Take by mouth., Disp: , Rfl:  .  nystatin ointment (MYCOSTATIN), Apply topically 2 (two) times daily as needed., Disp: 30 g, Rfl: 2 .  omeprazole (PRILOSEC) 20 MG capsule, Take 20 mg by mouth daily., Disp: , Rfl:  .  ondansetron (ZOFRAN) 4 MG tablet, Take 1 tablet (4 mg total) by mouth daily as needed for nausea or vomiting., Disp: 30 tablet, Rfl: 1 .  simvastatin (ZOCOR) 20 MG tablet, simvastatin 20 mg tablet, Disp: , Rfl: :  Allergies  Allergen Reactions  . Ace Inhibitors Swelling    angioedema  :  No family history on file.:  Social History   Socioeconomic History  . Marital status: Single    Spouse name: Not on file  . Number of children: Not on file  . Years of education: Not on file  . Highest education level: Not on file  Occupational History  . Not on file  Social Needs  . Financial resource strain: Not on file  . Food insecurity:    Worry: Not on file    Inability: Not on file  . Transportation needs:    Medical: Not on file    Non-medical: Not on file  Tobacco Use  . Smoking status: Former Smoker    Last attempt to quit: 03/14/1975    Years since quitting: 42.9  . Smokeless tobacco: Never Used  Substance and Sexual Activity  . Alcohol use: No    Alcohol/week: 0.0 standard drinks    Comment: occ  . Drug use: No  . Sexual activity: Not on file  Lifestyle  . Physical activity:    Days per week: Not on file    Minutes per session: Not on file  . Stress: Not on file  Relationships  . Social connections:    Talks on phone: Not on file    Gets together: Not on file    Attends religious service: Not on file    Active member of club or organization: Not on file    Attends meetings of clubs or organizations: Not on file    Relationship status: Not on file  . Intimate partner violence:    Fear of current or ex partner: Not on file    Emotionally abused:  Not on file    Physically abused: Not on file    Forced sexual activity: Not on file  Other Topics Concern  . Not on file  Social History Narrative  . Not on file  :   Physical exam: Blood pressure (!) 164/91, pulse 66, temperature 98.1 F (36.7 C), temperature source Oral, resp. rate 18, weight 174 lb 2 oz (79 kg), SpO2 99 %.   ECOG: 1 General appearance: alert and cooperative appeared without distress.  Head: atraumatic without any abnormalities. Eyes: conjunctivae/corneas clear. PERRL.  Sclera anicteric. Throat: lips, mucosa, and tongue normal; without oral thrush or ulcers. Resp: clear to auscultation bilaterally without rhonchi, wheezes or dullness to percussion. Cardio: regular rate and rhythm, S1, S2 normal, no murmur, click, rub or gallop GI: soft, non-tender; bowel sounds normal; no masses,  no organomegaly Skin: Skin color, texture, turgor normal. No rashes or lesions Lymph nodes: Cervical, supraclavicular, and axillary nodes normal. Neurologic: Grossly normal without any motor, sensory or deep tendon reflexes. Musculoskeletal: No joint deformity or effusion.  CBC    Component Value Date/Time   WBC 6.2 06/01/2017 1544   RBC 3.98 (L) 06/01/2017 1544   HGB 13.0 (L) 06/01/2017 1544   HCT 37.9 (L) 06/01/2017 1544   PLT 146 06/01/2017 1544   MCV 95.2 06/01/2017 1544   MCH 32.7 06/01/2017 1544   MCHC 34.3 06/01/2017 1544   RDW 12.7 06/01/2017 1544   LYMPHSABS 1,488 06/01/2017 1544   MONOABS 649 10/08/2016 1247   EOSABS 68 06/01/2017 1544   BASOSABS 19 06/01/2017 1544     Chemistry      Component Value Date/Time   NA 142 01/12/2018 1525   K 4.9 01/12/2018 1525   CL 110 01/12/2018 1525   CO2 25 01/12/2018 1525   BUN 21 01/12/2018 1525   CREATININE 1.39 (H) 01/12/2018 1525      Component Value Date/Time   CALCIUM 8.8 01/12/2018 1525   ALKPHOS 66 03/01/2015 1742   AST 21 01/12/2018 1525   ALT 13 01/12/2018 1525   BILITOT 0.6 01/12/2018 1525        Assessment and Plan:   84 year old man with the following:  1.  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  He was initially diagnosed in the year 2000 and subsequently developed recurrent disease in 2019.  He is currently on androgen deprivation therapy alone with excellent PSA response of 0.1.  The natural course of this disease was discussed today and treatment options were reviewed.  These options would include androgen deprivation therapy alone, adding Zytiga or enzalutamide, or adding systemic chemotherapy.  Risks and benefits of all these approaches were reviewed today.  Complication associated with this therapy was also discussed.  After discussion today, we have decided to continue with androgen deprivation therapy alone and consider additional therapy he develops castration-resistant disease.  2.  Androgen deprivation: He is currently receiving that under the care of Dr. Lovena Neighbours.  This will continue indefinitely.  Long-term complication associated with this therapy include hot flashes, osteoporosis among others.  He is agreeable to continue at this time.  3.  Bone directed therapy: I recommended continuing Xgeva indefinitely on a monthly basis.  Complications associated with this therapy including hypocalcemia and osteonecrosis of the jaw were reviewed.  He is agreeable to proceed at this time.  He has no dental issues to report and has received Xgeva prior to relocation to this area.  4.  Prognosis and goals of care: I think therapy is palliative at this time and his disease is incurable.  His performance status is reasonable and aggressive therapy remains warranted.  5.  Back pain: Controlled with fentanyl and Neurontin at this time.  6.  Follow-up: On a monthly basis for Xgeva with MD follow-up in 2 months.  60  minutes was spent with the patient face-to-face today.  More than 50% of time was dedicated to reviewing the natural course of his disease,  treatment options and addressing complications related  to therapy.     Thank you for the referral.  A copy of this consult has been forwarded to the requesting physician.

## 2018-02-05 NOTE — Telephone Encounter (Signed)
Printed avs and calender of upcoming appointment. Per 11/29 los 

## 2018-02-08 ENCOUNTER — Other Ambulatory Visit: Payer: Self-pay | Admitting: *Deleted

## 2018-02-08 MED ORDER — OMEPRAZOLE 20 MG PO CPDR: 20.0000 mg | DELAYED_RELEASE_CAPSULE | Freq: Every day | ORAL | 1 refills | Status: DC

## 2018-02-08 MED ORDER — LISINOPRIL 10 MG PO TABS: 10.0000 mg | ORAL_TABLET | Freq: Every day | ORAL | 1 refills | Status: DC

## 2018-02-12 ENCOUNTER — Inpatient Hospital Stay: Payer: Medicare Other

## 2018-02-12 ENCOUNTER — Telehealth: Payer: Self-pay | Admitting: *Deleted

## 2018-02-12 NOTE — Telephone Encounter (Signed)
-----   Message from Wyatt Portela, MD sent at 02/11/2018  1:23 PM EST ----- Regarding: RE: Tx pending Will cancel for 12/6.   Logan Brown:   Please let the patient know that we will resume Xgeva on 1/2.   Thanks.  ----- Message ----- From: Altamese Dilling D Sent: 02/11/2018   1:09 PM EST To: Randolm Idol, RN, Neysa Hotter, [facility], # Subject: Tx pending                                     Good afternoon doctor Alen Blew,  For this pt, our request for Delton See is pending authorization.  I see that he is coming in tomorrow. I do not believe that we will have authorization back in time. Would you like to reschedule this pt appointment? I just do not want to take a chance in this pt getting denied and then receiving a bill.  Thank you [facility]

## 2018-02-12 NOTE — Telephone Encounter (Signed)
Spoke with patient. Gave new appt time for lab and xgeva injection 01/10/18 @ 12:15. Along with address, for his transportation needs

## 2018-02-17 ENCOUNTER — Telehealth: Payer: Self-pay

## 2018-02-17 NOTE — Telephone Encounter (Signed)
Patient requesting a prescription for Tylenol with Codeine. Per provider, patient will not be prescribed that through our office and needs to contact original prescribing doctor, oncologist or pain management.  Patient informed.

## 2018-02-23 ENCOUNTER — Telehealth: Payer: Self-pay | Admitting: *Deleted

## 2018-02-23 ENCOUNTER — Ambulatory Visit: Admitting: Urology

## 2018-02-23 NOTE — Progress Notes (Signed)
* * *        **  [name]    --- ---    41 Y old Male, DOB: 1931-06-05    [address] , [place], [place] [address]    Home: [phone]    Provider: [name]        * * *    Telephone Encounter    ---    Answered by   Kathreen Devoid  Date: 02/23/2018         Time: 12:38 PM    Caller   Self    --- ---            Reason   Refill            Message                      Pt requesting refill of Fentanyl 25mg  patch. Pharmacy is [facility] [address] [place], [place]. Also wondering if we can prescribe anything else for the pain. CB# [phone]                Action Taken                      Ivos,Maria  02/23/2018 12:38:48 PM >      Puzio, Corinne L 02/23/2018 04:13:49 PM > Pt called about this issue at 4:00.  Requesting pain meds for chronic back pain.  I told him that I don't think that the fentanyl patches were prescribed through you.  Patient's spouse was shouting very loudly in the background at him.  He then hung up.                          * * *                ---          * * *          Patient: Daray, Polgar DOB: 02-Aug-1931 Provider: [name]  02/23/2018    ---    Note generated by eClinicalWorks EMR/PM Software ([email or web address])

## 2018-02-23 NOTE — Telephone Encounter (Signed)
I did not prescribe these medications.

## 2018-02-23 NOTE — Telephone Encounter (Signed)
Logan Brown, patient's S/O calling to request a refill on 25 mcg fentanyl patches. He only has 2 left.  States they help some, but he needs something stronger. States they missed the 02/12/18 appt with dr Alen Blew, d/t vehicle was in the "shop." States dr Melford Aase referred him to his oncologist for pain medication.

## 2018-02-25 ENCOUNTER — Telehealth: Payer: Self-pay | Admitting: Internal Medicine

## 2018-02-25 NOTE — Telephone Encounter (Signed)
Betty @ [facility] called to advise, patient has been called several times to schedule, no returned calls by patient. Please be advised Chart closed by Dr Vira Blanco.

## 2018-03-11 ENCOUNTER — Encounter: Payer: Self-pay | Admitting: Oncology

## 2018-03-11 NOTE — Progress Notes (Signed)
Called pt to introduce myself as his Arboriculturist and to discuss copay assistance.  Pt gave me consent to apply in his behalf and he was approved w/ the [employer] Access Network for $7,300 from10/4/19 to 1/1/21for Xgeva.  Ptis overqualifiedfor the [employer].I will give him mycardon 03/12/18 for any questions or concerns he may have in the future

## 2018-03-12 ENCOUNTER — Inpatient Hospital Stay: Payer: Medicare Other

## 2018-03-12 ENCOUNTER — Inpatient Hospital Stay: Payer: Medicare Other | Attending: Oncology

## 2018-03-12 DIAGNOSIS — C61 Malignant neoplasm of prostate: Secondary | ICD-10-CM

## 2018-03-12 DIAGNOSIS — Z79899 Other long term (current) drug therapy: Secondary | ICD-10-CM | POA: Diagnosis not present

## 2018-03-12 DIAGNOSIS — C7951 Secondary malignant neoplasm of bone: Secondary | ICD-10-CM | POA: Insufficient documentation

## 2018-03-12 DIAGNOSIS — S32020D Wedge compression fracture of second lumbar vertebra, subsequent encounter for fracture with routine healing: Secondary | ICD-10-CM

## 2018-03-12 LAB — CBC WITH DIFFERENTIAL (CANCER CENTER ONLY)
ABS IMMATURE GRANULOCYTES: 0.01 10*3/uL (ref 0.00–0.07)
Basophils Absolute: 0 10*3/uL (ref 0.0–0.1)
Basophils Relative: 1 %
Eosinophils Absolute: 0.2 10*3/uL (ref 0.0–0.5)
Eosinophils Relative: 3 %
HCT: 36.6 % — ABNORMAL LOW (ref 39.0–52.0)
Hemoglobin: 12.1 g/dL — ABNORMAL LOW (ref 13.0–17.0)
Immature Granulocytes: 0 %
Lymphocytes Relative: 27 %
Lymphs Abs: 1.2 10*3/uL (ref 0.7–4.0)
MCH: 32.5 pg (ref 26.0–34.0)
MCHC: 33.1 g/dL (ref 30.0–36.0)
MCV: 98.4 fL (ref 80.0–100.0)
MONO ABS: 0.4 10*3/uL (ref 0.1–1.0)
MONOS PCT: 9 %
NEUTROS ABS: 2.8 10*3/uL (ref 1.7–7.7)
Neutrophils Relative %: 60 %
PLATELETS: 168 10*3/uL (ref 150–400)
RBC: 3.72 MIL/uL — ABNORMAL LOW (ref 4.22–5.81)
RDW: 13.2 % (ref 11.5–15.5)
WBC Count: 4.6 10*3/uL (ref 4.0–10.5)
nRBC: 0 % (ref 0.0–0.2)

## 2018-03-12 LAB — CMP (CANCER CENTER ONLY)
ALT: 13 U/L (ref 0–44)
AST: 18 U/L (ref 15–41)
Albumin: 3.6 g/dL (ref 3.5–5.0)
Alkaline Phosphatase: 65 U/L (ref 38–126)
Anion gap: 9 (ref 5–15)
BUN: 19 mg/dL (ref 8–23)
CO2: 22 mmol/L (ref 22–32)
Calcium: 9 mg/dL (ref 8.9–10.3)
Chloride: 110 mmol/L (ref 98–111)
Creatinine: 1.45 mg/dL — ABNORMAL HIGH (ref 0.61–1.24)
GFR, EST AFRICAN AMERICAN: 51 mL/min — AB (ref >60)
GFR, EST NON AFRICAN AMERICAN: 44 mL/min — AB (ref >60)
Glucose, Bld: 131 mg/dL — ABNORMAL HIGH (ref 70–99)
Potassium: 4.2 mmol/L (ref 3.5–5.1)
SODIUM: 141 mmol/L (ref 135–145)
Total Bilirubin: 0.5 mg/dL (ref 0.3–1.2)
Total Protein: 6.5 g/dL (ref 6.5–8.1)

## 2018-03-12 MED ORDER — DENOSUMAB 120 MG/1.7ML ~~LOC~~ SOLN
120.0000 mg | Freq: Once | SUBCUTANEOUS | Status: AC
  Administered 2018-03-12: 120 mg via SUBCUTANEOUS

## 2018-03-12 NOTE — Patient Instructions (Signed)

## 2018-03-13 LAB — PROSTATE-SPECIFIC AG, SERUM (LABCORP): Prostate Specific Ag, Serum: <0.1 ng/mL (ref 0.0–4.0)

## 2018-03-15 ENCOUNTER — Telehealth: Payer: Self-pay

## 2018-03-15 ENCOUNTER — Telehealth: Payer: Self-pay | Admitting: *Deleted

## 2018-03-15 NOTE — Telephone Encounter (Signed)
Patient requesting pain medication for his back. Advised patient that our office cannot prescribe any type of narcotic for his pain, he would need to call his oncologist since Pain Management won't see him.

## 2018-03-15 NOTE — Telephone Encounter (Signed)
"  Logan Brown [phone]).  I need something for pain.  Received shot (Denosumab) for the pain in my back Friday.  Tylenol,  Ibuprofen are not working."

## 2018-03-16 ENCOUNTER — Telehealth: Payer: Self-pay | Admitting: Internal Medicine

## 2018-03-16 NOTE — Telephone Encounter (Signed)
Patient called to request renewal on alprazolam to [facility] [address].

## 2018-03-17 ENCOUNTER — Telehealth: Payer: Self-pay | Admitting: Internal Medicine

## 2018-03-17 ENCOUNTER — Telehealth: Payer: Self-pay

## 2018-03-17 ENCOUNTER — Other Ambulatory Visit: Payer: Self-pay | Admitting: Oncology

## 2018-03-17 MED ORDER — FENTANYL 25 MCG/HR TD PT72: 25.0000 ug | MEDICATED_PATCH | TRANSDERMAL | 0 refills | Status: DC

## 2018-03-17 NOTE — Telephone Encounter (Signed)
Patient called stating that he is having increased pain in his back and is requesting an increase in pain management. Patient stated that he is using the fentanyl patch, 1 patch Q 3 days, and is still having increased pain. The fentanyl patches were prescribed by MD in [place], he stated he has 5 patches left and will run out prior to next appointment with Dr. Alen Blew. A refill of 10 patches was sent to patient pharmacy by Dr. Alen Blew, patient made aware and will discuss pain management with patient at next appointment. Patient verbalized understanding.

## 2018-03-17 NOTE — Telephone Encounter (Signed)
Called [facility] to request the status of Alprazolam rx sent to [facility] 02-02-18. Pharmacist states it is on hold, did not get filled, it was too soon. An rx for Alprazolam was presented to pharmacy 02-01-2018 from out of state provider. Per  Dr [name], deactivate Alprazolam rx we sent to pharmacy 02-02-2018. Patient received rx for Alprazolam 02-01-18 from  provider in [place] and submitted to [facility].  Pharmacist  deacitvated  our  Alprazolam rx. Notified patient.    Dr Melford Aase, and [name], NP, recommended Palliative Care Referral for patient. Patient is agreeable to the palliative care referral. Advised they will call to schedule initial evaluation appointment in home.

## 2018-03-18 ENCOUNTER — Telehealth: Payer: Self-pay

## 2018-03-18 NOTE — Telephone Encounter (Signed)
Received a call from the patient stating that the pharmacy has not received the prescription. Contacted the [facility] and they stated that they did receive the prescription but is too early to fill, earliest can fill is 1/15, but pharmacist stated that they will order the medication to have on hand for refill. Contacted the patient and made aware that the pharmacy did receive the refill but too early to fill. Instructed him to call the pharmacy on 1/15 and they can process the order at that time. Patient verbalized understanding and had no other questions or concerns.

## 2018-03-22 ENCOUNTER — Telehealth: Payer: Self-pay | Admitting: Internal Medicine

## 2018-03-22 ENCOUNTER — Telehealth: Payer: Self-pay | Admitting: Primary Care

## 2018-03-22 ENCOUNTER — Ambulatory Visit: Admitting: Urology

## 2018-03-22 NOTE — Telephone Encounter (Signed)
T/c to set up appointment. Patient states great pain and he is expecting a call from PCP and MD in [place] for  Medications.  Call to Dr. Idell Pickles office and [facility] in [place], [place]. Neither are prescribing controlled meds. Pt has pain management appt in AM. Encouraged pt to go to this appt to have all pain needs addressed in one place. Encouraged to use OTC until then for pain management. Appt made for 1/15 to see pt at home for community palliative care.

## 2018-03-22 NOTE — Telephone Encounter (Signed)
Logan Brown w/ Palliative Care called patient to schedule.  states patient requested break through pain medicine.  Advised that oncology and pain management handle pain medicine. She will follow up with specialists.

## 2018-03-22 NOTE — Progress Notes (Signed)
 * * *      **[name]**    ------    58 Y old Male, DOB: [birth year]    [address] , [place], [place] [address]    Home: [phone]    Provider: [name]        * * *    Telephone Encounter    ---    Answered by  Margarita Grizzle Date: 03/22/2018       Time: 04:12 PM    Caller  Clearnce Sorrel NP with pallative care    ------            Reason  FYI only            Message                     pt is in [place]  living down there    stating pt needs meds    please call [phone]      pharmacy number  [phone] [facility]                Action Taken                     [facility]  03/22/2018 4:14:25 PM >      [name],[name]  03/22/2018 4:50:06 PM > spoke to [place]- advised we did not rx pain meds to pt- she may need to call MD who did pyloplatsy causing the back pain- The MD [place] is not a urologist she says but a PCP who does not rx controlled meds                    * * *              * * *        ---      Reason for Appointment    ---     1\. Needs rx    ---     Current Medications    ---    Unknown    * Omeprazole 20 MG Capsule Delayed Release 1 capsule Orally Once a day    ---    * Acetaminophen 500 MG Capsule 1 capsule as needed Orally every 6 hrs    ---    * Fentanyl 50 MCG/HR Patch 72 Hour 1 patch to skin Transdermal     ---    * Gabapentin 300 MG Capsule 1 capsule Orally Once a day    ---    * Lisinopril 5 MG Tablet 1 tablet Orally Once a day    ---    * Metoprolol Succinate ER 25 MG Tablet Extended Release 24 Hour 1 tablet Orally Once a day    ---    * MiraLax - Powder as directed Orally     ---    * Oxycodone HCl 5 MG/5ML Solution 5 ml as needed Orally every 6 hrs    ---    * Milk of Magnesia 400 MG/5ML Suspension 5 ml as needed Orally Four times a day    ---    * Bisacodyl 10 MG Suppository 1 suppository as needed Rectal Once a day    ---    * Aspirin 81 MG Tablet Delayed Release 1 tablet Orally Once a day    ---    * Calcium     ---    *  Casodex 50  MG Tablet 1 tablet Orally Once a day    ---    * Centrum Silver Tablet as directed Orally     ---    * Hydrochlorothiazide 25 MG Tablet 1 tablet Orally Once a day    ---    * Xanax 0.25 MG Tablet 1 tablet Orally Twice a day    ---    * Hydrocodone-Acetaminophen 5-325 MG Tablet 1 tablet as needed Orally every 6 hrs    ---    * Amiodarone HCl 200 MG Tablet 1 tablet Orally Once a day    ---    * Zestril 5 MG Tablet 1 tablet Orally Once a day    ---    * Zocor 10 MG Tablet 1 tablet every evening Orally Once a day    ---    * Xarelto 15 MG Tablet Orally     ---          * * *         Patient: [name], [name] DOB: March 02, 1932 Provider: [name]  03/22/2018    ---    Note generated by eClinicalWorks EMR/PM Software ([email or web address])

## 2018-03-23 ENCOUNTER — Encounter: Payer: Self-pay | Admitting: Nurse Practitioner

## 2018-03-23 ENCOUNTER — Other Ambulatory Visit: Payer: Self-pay

## 2018-03-23 ENCOUNTER — Ambulatory Visit: Payer: Medicare Other | Attending: Nurse Practitioner | Admitting: Nurse Practitioner

## 2018-03-23 VITALS — BP 170/99 | HR 71 | Temp 98.1°F | Resp 16 | Ht 67.0 in | Wt 177.0 lb

## 2018-03-23 DIAGNOSIS — Z79891 Long term (current) use of opiate analgesic: Secondary | ICD-10-CM | POA: Insufficient documentation

## 2018-03-23 DIAGNOSIS — M899 Disorder of bone, unspecified: Secondary | ICD-10-CM | POA: Diagnosis not present

## 2018-03-23 DIAGNOSIS — G8929 Other chronic pain: Secondary | ICD-10-CM | POA: Insufficient documentation

## 2018-03-23 DIAGNOSIS — Z79899 Other long term (current) drug therapy: Secondary | ICD-10-CM | POA: Diagnosis not present

## 2018-03-23 DIAGNOSIS — M545 Low back pain: Secondary | ICD-10-CM | POA: Diagnosis not present

## 2018-03-23 DIAGNOSIS — Z789 Other specified health status: Secondary | ICD-10-CM | POA: Diagnosis not present

## 2018-03-23 DIAGNOSIS — G894 Chronic pain syndrome: Secondary | ICD-10-CM | POA: Diagnosis not present

## 2018-03-23 NOTE — Progress Notes (Signed)
Patient's Name: Trell Secrist  MRN: [id number]  Referring Provider: [name], MD  DOB: 1931-07-22  PCP: [name], MD  DOS: 03/23/2018  Note by: Dionisio David NP  Service setting: Ambulatory outpatient  Specialty: Interventional Pain Management  Location: [facility] (AMB) Pain Management Facility    Patient type: New Patient    Primary Reason(s) for Visit: Initial Patient Evaluation CC: Back Pain (lower) and Neck Pain  HPI  Mr. Ta is a 85 y.o. year old, male patient, who comes today for an initial evaluation. He has HTN (hypertension); Mixed hyperlipidemia; Prediabetes; Vitamin D deficiency; Medication management; Depression, major, in remission ([place]); CKD (chronic kidney disease) stage 3, GFR 30-59 ml/min ([place]); Osteoarthritis; Nonischemic cardiomyopathy ([place]); Atrial fibrillation ([place]); Overweight (BMI 25.0-29.9); Prostate cancer ([place]); Hypocalcemia; Compression fracture of L2 lumbar vertebra, with routine healing, subsequent encounter; Scrotal itching; Anxiety; Chronic pain syndrome; Long term current use of opiate analgesic; Long term prescription benzodiazepine use; Pharmacologic therapy; Disorder of skeletal system; Problems influencing health status; and Chronic bilateral low back pain without sciatica (Primary Area of Pain) (R>L) on their problem list.. His primarily concern today is the Back Pain (lower) and Neck Pain  Pain Assessment: Location:   Back Radiating: denies Onset: More than a month ago Duration: Chronic pain Quality: Aching, Constant Severity: 4 /10 (subjective, self-reported pain score)  Note: Reported level is compatible with observation.                          Effect on ADL: putting clothes on, bending, walking, standing, "I have cancer in my back", wiping myself from the bathroom Timing: Constant Modifying factors: Prescrptions when I can get pain prescrition BP: (!) 170/99  HR: 71  Onset and Duration: Present longer than 3 months Cause of pain:  cancer, arthritis Severity: Getting worse, NAS-11 at its worse: 5/10, NAS-11 at its best: 7/10, NAS-11 now: 5/10 and NAS-11 on the average: 6/10 Timing: Morning Aggravating Factors: Bending, Kneeling, Lifiting, Prolonged sitting, Squatting and Stooping  Alleviating Factors: Medications, Resting, TENS and Using a brace Associated Problems: Constipation, Depression, Dizziness, Erectile dysfunction, Fatigue, Temperature changes and Pain that does not allow patient to sleep Quality of Pain: Aching, Agonizing, Annoying, Intermittent, Deep, Exhausting, Sickening and Uncomfortable Previous Examinations or Tests: MRI scan Previous Treatments: The patient denies treatment  The patient comes into the clinics today for the first time for a chronic pain management evaluation.  According to the patient his primary area of pain is in his lower back.  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  He did suffer a compression fracture and is status post kyphoplasty.  He has not done any recent physical therapy.  His last MRI was May 2019.  Today I took the time to provide the patient with information regarding this pain practice. The patient was informed that the practice is divided into two sections: an interventional pain management section, as well as a completely separate and distinct medication management section. I explained that there are procedure days for interventional therapies, and evaluation days for follow-ups and medication management. Because of the amount of documentation required during both, they are kept separated. This means that there is the possibility that he may be scheduled for a procedure on one day, and medication management the next. I have also informed him that because of staffing and facility limitations, this practice will no longer take patients for medication management only. To illustrate the  reasons for this, I gave the patient the example of  surgeons, and how inappropriate it would be to refer a patient to his/her care, just to write for the post-surgical antibiotics on a surgery done by a different surgeon.   Because interventional pain management is part of the board-certified specialty for the doctors, the patient was informed that joining this practice means that they are open to any and all interventional therapies. I made it clear that this does not mean that they will be forced to have any procedures done. What this means is that I believe interventional therapies to be essential part of the diagnosis and proper management of chronic pain conditions. Therefore, patients not interested in these interventional alternatives will be better served under the care of a different practitioner.  The patient was also made aware of my Comprehensive Pain Management Safety Guidelines where by joining this practice, they limit all of their nerve blocks and joint injections to those done by our practice, for as long as we are retained to manage their care. Historic Controlled Substance Pharmacotherapy Review  PMP and historical list of controlled substances: Oxycodone 5 mg fentanyl 25 mcg patch, alprazolam 1 mg, tramadol 50 mg, hydrocodone/acetaminophen 5/325 mg, Belsomra 20 mg, acetaminophen codeine No. 3,  highest opioid analgesic regimen found: Oxycodone 5 mg 1 tablet twice daily plus fentanyl 25 mcg patch (fill date 02/24/2018) oxycodone 20 mg/day plus fentanyl 5 mcg patch Most recent opioid analgesic:  Oxycodone 5 mg 1 tablet twice daily plus fentanyl 25 mcg patch (fill date 02/24/2018) oxycodone 20 mg/day plus fentanyl 5 mcg patch Current opioid analgesics: Oxycodone 5 mg 1 tablet twice daily plus fentanyl 25 mcg patch (fill date 02/24/2018) oxycodone 20 mg/day plus fentanyl 5 mcg patch  Highest recorded MME/day: 90 mg/day MME/day: 90 mg/day Medications: The patient did not bring the medication(s) to the appointment, as requested in our "New  Patient Package" Pharmacodynamics: Desired effects: Analgesia: The patient reports >50% benefit. Reported improvement in function: The patient reports medication allows him to accomplish basic ADLs. Clinically meaningful improvement in function (CMIF): Sustained CMIF goals met Perceived effectiveness: Described as relatively effective, allowing for increase in activities of daily living (ADL) Undesirable effects: Side-effects or Adverse reactions: None reported Historical Monitoring: The patient  reports no history of drug use. List of all UDS Test(s): No results found for: MDMA, COCAINSCRNUR, PCPSCRNUR, PCPQUANT, CANNABQUANT, THCU, [place] List of all Serum Drug Screening Test(s):  No results found for: AMPHSCRSER, BARBSCRSER, BENZOSCRSER, COCAINSCRSER, PCPSCRSER, PCPQUANT, THCSCRSER, CANNABQUANT, OPIATESCRSER, OXYSCRSER, PROPOXSCRSER Historical Background Evaluation: [place] PDMP: Six (6) year initial data search conducted.             [place] Department of public safety, offender search: Editor, commissioning Information) Non-contributory Risk Assessment Profile: Aberrant behavior: None observed or detected today Risk factors for fatal opioid overdose: None identified today Fatal overdose hazard ratio (HR): Calculation deferred Non-fatal overdose hazard ratio (HR): Calculation deferred Risk of opioid abuse or dependence: 0.7-3.0% with doses ? 36 MME/day and 6.1-26% with doses ? 120 MME/day. Substance use disorder (SUD) risk level: Pending results of Medical Psychology Evaluation for SUD Opioid risk tool (ORT) (Total Score): 0  ORT Scoring interpretation table:  Score <3 = Low Risk for SUD  Score between 4-7 = Moderate Risk for SUD  Score >8 = High Risk for Opioid Abuse   PHQ-2 Depression Scale:  Total score: 3  PHQ-2 Scoring interpretation table: (Score and probability of major depressive disorder)  Score 0 = No depression  Score 1 = 15.4% Probability  Score 2 = 21.1% Probability  Score 3 = 38.4%  Probability  Score 4 = 45.5% Probability  Score 5 = 56.4% Probability  Score 6 = 78.6% Probability   PHQ-9 Depression Scale:  Total score: 11  PHQ-9 Scoring interpretation table:  Score 0-4 = No depression  Score 5-9 = Mild depression  Score 10-14 = Moderate depression  Score 15-19 = Moderately severe depression  Score 20-27 = Severe depression (2.4 times higher risk of SUD and 2.89 times higher risk of overuse)   Pharmacologic Plan: Pending ordered tests and/or consults  Meds  The patient has a current medication list which includes the following prescription(s): aspirin ec, vitamin d3, fentanyl, gabapentin, hydroxyzine, labetalol, lisinopril, multiple vitamins-minerals, omeprazole, and influenza vac split quadrivalent pf.  Current Outpatient Medications on File Prior to Visit  Medication Sig  . aspirin EC 81 MG tablet Take 81 mg by mouth daily.  . Cholecalciferol (VITAMIN D3) 25 MCG (1000 UT) CAPS Vitamin D3 1,000 unit capsule  Take by oral route.  . fentaNYL (DURAGESIC - DOSED MCG/HR) 25 MCG/HR patch Place 1 patch (25 mcg total) onto the skin every 3 (three) days.  [name] Kitchen gabapentin (NEURONTIN) 600 MG tablet Take 1/2 to 1 tablet 2 to 3 x /day for chronic pain  . hydrOXYzine (ATARAX/VISTARIL) 25 MG tablet Take 1 tablet (25 mg total) by mouth every 6 (six) hours as needed for itching.  . labetalol (NORMODYNE) 100 MG tablet Take 100 mg by mouth 2 (two) times daily. Take 55m in the morning and 576min the evening  . lisinopril (PRINIVIL,ZESTRIL) 10 MG tablet Take 1 tablet (10 mg total) by mouth daily.  . Multiple Vitamins-Minerals (CENTRUM SILVER ADULT 50+ PO) Take by mouth.  . [name] Kitchenmeprazole (PRILOSEC) 20 MG capsule Take 1 capsule (20 mg total) by mouth daily.  . Influenza vac split quadrivalent PF (FLUZONE HIGH-DOSE) 0.5 ML injection Fluzone High-Dose 2015-16 (PF) 180 mcg/0.5 mL intramuscular syringe   No current facility-administered medications on file prior to visit.    Imaging Review   Lumbosacral Imaging:  Lumbar DG (Complete) 4+V:  Results for orders placed during the hospital encounter of 02/26/17  DG Lumbar Spine Complete   Narrative CLINICAL DATA:  Low back pain injury for several months, history prostate cancer  EXAM: LUMBAR SPINE - COMPLETE 4+ VIEW  COMPARISON:  11/03/2016  FINDINGS: Diffuse osseous demineralization.  Five non-rib-bearing lumbar vertebra.  Multilevel disc space narrowing and endplate spur formation.  Vertebral body heights maintained.  No definite fracture, subluxation or bone destruction.  Minimal facet degenerative changes lower lumbar spine.  No definite spondylolysis  SI joints symmetric.  IMPRESSION: Osseous demineralization with degenerative disc disease changes and minimal facet degenerative changes of the lumbar spine.  No acute abnormalities.   Electronically Signed   By: MaLavonia Dana.D.   On: 02/26/2017 15:23   Hip Imaging:  Results for orders placed during the hospital encounter of 11/03/16  DG Hip Unilat W or Wo Pelvis 2-3 Views Right   Narrative CLINICAL DATA:  Low back and right hip pain.  Fell today.  EXAM: DG HIP (WITH OR WITHOUT PELVIS) 2-3V RIGHT  COMPARISON:  None.  FINDINGS: Both hips are normally located. Moderate degenerative changes bilaterally. No acute hip fractures identified. The pubic symphysis and SI joints are intact. No definite pelvic fractures or bone lesions.  IMPRESSION: No acute hip or pelvic fractures.   Electronically Signed   By: P.Ricky Stabs.  On: 11/03/2016 16:31    Note: Available results from prior imaging studies were reviewed.        ROS  Cardiovascular History: Heart murmur Pulmonary or Respiratory History: Shortness of breath Neurological History: No reported neurological signs or symptoms such as seizures, abnormal skin sensations, urinary and/or fecal incontinence, being born with an abnormal open spine and/or a tethered spinal cord Review of  Past Neurological Studies:  Results for orders placed or performed during the hospital encounter of 11/03/16  CT Head Wo Contrast   Narrative   CLINICAL DATA:  [place] on wet floor. History of prostate cancer, hypertension hyperlipidemia.  EXAM: CT HEAD WITHOUT CONTRAST  TECHNIQUE: Contiguous axial images were obtained from the base of the skull through the vertex without intravenous contrast.  COMPARISON:  None.  FINDINGS: BRAIN: No intraparenchymal hemorrhage, mass effect nor midline shift. The ventricles and sulci are normal for age. Patchy supratentorial white matter hypodensities less than expected for patient's age, though non-specific are most compatible with chronic small vessel ischemic disease. No acute large vascular territory infarcts. No abnormal extra-axial fluid collections. Basal cisterns are patent.  VASCULAR: Mild calcific atherosclerosis of the carotid siphons.  SKULL: No skull fracture. No significant scalp soft tissue swelling.  SINUSES/ORBITS: Minimal secretions LEFT sphenoid sinus without air-fluid levels. The included ocular globes and orbital contents are non-suspicious. Status post bilateral ocular lens implants.  OTHER: None.  IMPRESSION: No acute intracranial process ; negative noncontrast CT HEAD for age.   Electronically Signed   By: Elon Alas [name]   On: 11/03/2016 16:10    Psychological-Psychiatric History: Depressed and Difficulty sleeping and or falling asleep Gastrointestinal History: Irregular, infrequent bowel movements (Constipation) Genitourinary History: No reported renal or genitourinary signs or symptoms such as difficulty voiding or producing urine, peeing blood, non-functioning kidney, kidney stones, difficulty emptying the bladder, difficulty controlling the flow of urine, or chronic kidney disease Hematological History: No reported hematological signs or symptoms such as prolonged bleeding, low or poor functioning  platelets, bruising or bleeding easily, hereditary bleeding problems, low energy levels due to low hemoglobin or being anemic Endocrine History: No reported endocrine signs or symptoms such as high or low blood sugar, rapid heart rate due to high thyroid levels, obesity or weight gain due to slow thyroid or thyroid disease Rheumatologic History: Rheumatoid arthritis Musculoskeletal History: Negative for myasthenia gravis, muscular dystrophy, multiple sclerosis or malignant hyperthermia Work History: Working full time  Allergies  Mr. Bolotin is allergic to ace inhibitors.  Laboratory Chemistry  Inflammation Markers No results found for: CRP, ESRSEDRATE (CRP: Acute Phase) (ESR: Chronic Phase) Renal Function Markers Lab Results  Component Value Date   BUN 19 03/12/2018   CREATININE 1.45 (H) 03/12/2018   GFRAA 51 (L) 03/12/2018   GFRNONAA 44 (L) 03/12/2018   Hepatic Function Markers Lab Results  Component Value Date   AST 18 03/12/2018   ALT 13 03/12/2018   ALBUMIN 3.6 03/12/2018   ALKPHOS 65 03/12/2018   Electrolytes Lab Results  Component Value Date   NA 141 03/12/2018   K 4.2 03/12/2018   CL 110 03/12/2018   CALCIUM 9.0 03/12/2018   MG 2.1 06/01/2017   Neuropathy Markers No results found for: [id number] Bone Pathology Markers Lab Results  Component Value Date   ALKPHOS 65 03/12/2018   VD25OH 39 06/01/2017   CALCIUM 9.0 03/12/2018   Coagulation Parameters Lab Results  Component Value Date   PLT 168 03/12/2018   Cardiovascular Markers Lab Results  Component Value Date  BNP 139.3 (H) 03/27/2017   HGB 12.1 (L) 03/12/2018   HCT 36.6 (L) 03/12/2018   Note: Lab results reviewed.  PFSH  Drug: Mr. Celani  reports no history of drug use. Alcohol:  reports no history of alcohol use. Tobacco:  reports that he quit smoking about 43 years ago. He has never used smokeless tobacco. Medical:  has a past medical history of Arthritis, Atrial fibrillation ([place]), Bone  cancer ([place]), Cardiomyopathy ([place]), Hyperlipidemia, Irregular heartbeat, Prostate CA ([place]), and Prostate cancer ([place]). Family: family history is not on file.  Past Surgical History:  Procedure Laterality Date  . CATARACT EXTRACTION    . EYE SURGERY Bilateral    IOL/CE on Lt in 1998 and Rt in 2011.  [name] Kitchen PROSTATE SURGERY    . WRIST FOREIGN BODY REMOVAL     1957 glass   Active Ambulatory Problems    Diagnosis Date Noted  . HTN (hypertension) 03/01/2015  . Mixed hyperlipidemia 03/01/2015  . Prediabetes 03/01/2015  . Vitamin D deficiency 03/01/2015  . Medication management 03/01/2015  . Depression, major, in remission ([place]) 03/24/2016  . CKD (chronic kidney disease) stage 3, GFR 30-59 ml/min ([facility]) 10/16/2016  . Osteoarthritis 02/24/2017  . Nonischemic cardiomyopathy ([place]) 03/27/2017  . Atrial fibrillation ([place]) 03/27/2017  . Overweight (BMI 25.0-29.9) 10/16/2017  . Prostate cancer ([place]) 01/12/2018  . Hypocalcemia 01/12/2018  . Compression fracture of L2 lumbar vertebra, with routine healing, subsequent encounter 01/13/2018  . Scrotal itching 01/13/2018  . Anxiety 01/13/2018  . Chronic pain syndrome 03/23/2018  . Long term current use of opiate analgesic 03/23/2018  . Long term prescription benzodiazepine use 03/23/2018  . Pharmacologic therapy 03/23/2018  . Disorder of skeletal system 03/23/2018  . Problems influencing health status 03/23/2018  . Chronic bilateral low back pain without sciatica (Primary Area of Pain) (R>L) 03/23/2018   Resolved Ambulatory Problems    Diagnosis Date Noted  . UTI (urinary tract infection) 11/16/2016   Past Medical History:  Diagnosis Date  . Arthritis   . Bone cancer ([place])   . Cardiomyopathy ([place])   . Hyperlipidemia   . Irregular heartbeat   . Prostate CA [facility])    Constitutional Exam  General appearance: Well nourished, well developed, and well hydrated. In no apparent acute distress Vitals:   03/23/18 1015  BP: (!) 170/99  Pulse: 71   Resp: 16  Temp: 98.1 F (36.7 C)  SpO2: 98%  Weight: 177 lb (80.3 kg)  Height: _0  (1.702 m)   BMI Assessment: Estimated body mass index is 27.72 kg/m as calculated from the following:   Height as of this encounter: _1  (1.702 m).   Weight as of this encounter: 177 lb (80.3 kg).  BMI interpretation table: BMI level Category Range association with higher incidence of chronic pain  <18 kg/m2 Underweight   18.5-24.9 kg/m2 Ideal body weight   25-29.9 kg/m2 Overweight Increased incidence by 20%  30-34.9 kg/m2 Obese (Class I) Increased incidence by 68%  35-39.9 kg/m2 Severe obesity (Class II) Increased incidence by 136%  >40 kg/m2 Extreme obesity (Class III) Increased incidence by 254%   BMI Readings from Last 4 Encounters:  03/23/18 27.72 kg/m  02/05/18 27.27 kg/m  01/12/18 26.55 kg/m  06/01/17 27.16 kg/m   Wt Readings from Last 4 Encounters:  03/23/18 177 lb (80.3 kg)  02/05/18 174 lb 2 oz (79 kg)  01/12/18 169 lb 8 oz (76.9 kg)  06/01/17 173 lb 6.4 oz (78.7 kg)  Psych/Mental status: Alert, oriented x 3 (  person, place, & time)      Patient having increased dizziness since starting fentanyl patches Eyes: PERLA Respiratory: No evidence of acute respiratory distress  Cervical Spine Exam  Inspection: No masses, redness, or swelling Alignment: Symmetrical Functional ROM: Unrestricted ROM      Stability: No instability detected Muscle strength & Tone: Functionally intact Sensory: Unimpaired Palpation: No palpable anomalies              Upper Extremity (UE) Exam    Side: Right upper extremity  Side: Left upper extremity  Inspection: No masses, redness, swelling, or asymmetry. No contractures  Inspection: No masses, redness, swelling, or asymmetry. No contractures  Functional ROM: Unrestricted ROM          Functional ROM: Unrestricted ROM          Muscle strength & Tone: Functionally intact  Muscle strength & Tone: Functionally intact  Sensory: Unimpaired  Sensory:  Unimpaired  Palpation: No palpable anomalies              Palpation: No palpable anomalies              Specialized Test(s): Deferred         Specialized Test(s): Deferred          Thoracic Spine Exam  Inspection: No masses, redness, or swelling Alignment: Symmetrical Functional ROM: Unrestricted ROM Stability: No instability detected Sensory: Unimpaired Muscle strength & Tone: No palpable anomalies  Lumbar Spine Exam  Inspection: No masses, redness, or swelling Alignment: Symmetrical Functional ROM: Unrestricted ROM      Stability: No instability detected Muscle strength & Tone: Functionally intact Sensory: Unimpaired Palpation: Complains of area being tender to palpation       Provocative Tests: Lumbar Hyperextension and rotation test: Positive bilaterally for facet joint pain.  Leg raises +45 degrees Patrick's Maneuver: Unchanged                    Gait & Posture Assessment  Ambulation: Unassisted Gait: Age-related, senile gait pattern Posture: Antalgic   Lower Extremity Exam    Side: Right lower extremity  Side: Left lower extremity  Inspection: No masses, redness, swelling, or asymmetry. No contractures  Inspection: No masses, redness, swelling, or asymmetry. No contractures  Functional ROM: Unrestricted ROM          Functional ROM: Unrestricted ROM          Muscle strength & Tone: Functionally intact  Muscle strength & Tone: Functionally intact  Sensory: Unimpaired  Sensory: Unimpaired  Palpation: No palpable anomalies  Palpation: No palpable anomalies   Assessment  Primary Diagnosis & Pertinent Problem List: The primary encounter diagnosis was Chronic bilateral low back pain without sciatica (Primary Area of Pain) (R>L). Diagnoses of Chronic pain syndrome, Long term current use of opiate analgesic, Long term prescription benzodiazepine use, Pharmacologic therapy, Disorder of skeletal system, and Problems influencing health status were also pertinent to this  visit.  Visit Diagnosis: 1. Chronic bilateral low back pain without sciatica (Primary Area of Pain) (R>L)   2. Chronic pain syndrome   3. Long term current use of opiate analgesic   4. Long term prescription benzodiazepine use   5. Pharmacologic therapy   6. Disorder of skeletal system   7. Problems influencing health status    Plan of Care  Initial treatment plan:  Please be advised that as per protocol, today's visit has been an evaluation only. We have not taken over the patient's controlled substance management.  Problem-specific  plan: No problem-specific Assessment & Plan notes found for this encounter.  Ordered Lab-work, Procedure(s), Referral(s), & Consult(s): Orders Placed This Encounter  Procedures  . Compliance Drug Analysis, Ur  . Comp. Metabolic Panel (12)  . Magnesium  . Vitamin B12  . Sedimentation rate  . 25-Hydroxyvitamin D Lcms D2+D3  . C-reactive protein  . Ambulatory referral to Physical Therapy   Pharmacotherapy: Medications ordered:  No orders of the defined types were placed in this encounter.  Medications administered during this visit: Dawid Dupriest had no medications administered during this visit.   Pharmacotherapy under consideration:  Opioid Analgesics: The patient was informed that there is no guarantee that he would be a candidate for opioid analgesics. The decision will be made following CDC guidelines. This decision will be based on the results of diagnostic studies, as well as Mr. Rueth risk profile.  Membrane stabilizer: To be determined at a later time Muscle relaxant: To be determined at a later time NSAID: To be determined at a later time Other analgesic(s): To be determined at a later time   Interventional therapies under consideration: Mr. Staples was informed that there is no guarantee that he would be a candidate for interventional therapies. The decision will be based on the results of diagnostic studies, as well as Mr.  Cromie risk profile.  Possible procedure(s): Diagnostic bilateral lumbar facet nerve block Possible bilateral lumbar facet radiofrequency ablation   Provider-requested follow-up: Return for 2nd Visit, w/ Dr. Dossie Arbour.  Future Appointments  Date Time Provider [place]  04/05/2018  1:15 PM [name], MD [facility] None  04/12/2018  8:00 AM [facility] 6 [facility] None  04/13/2018  3:00 PM Wyatt Portela, MD [facility] None  04/13/2018  3:30 PM [facility] [place] None  04/20/2018  3:30 PM [name], MD [facility] None  01/18/2019  2:00 PM Liane Comber, NP [facility] None    Primary Care Physician: [name], MD Location: [facility] Management [facility] Note by:  Date: 03/23/2018; Time: 1:17 PM  Pain Score Disclaimer: We use the NRS-11 scale. This is a self-reported, subjective measurement of pain severity with only modest accuracy. It is used primarily to identify changes within a particular patient. It must be understood that outpatient pain scales are significantly less accurate that those used for research, where they can be applied under ideal controlled circumstances with minimal exposure to variables. In reality, the score is likely to be a combination of pain intensity and pain affect, where pain affect describes the degree of emotional arousal or changes in action readiness caused by the sensory experience of pain. Factors such as social and work situation, setting, emotional state, anxiety levels, expectation, and prior pain experience may influence pain perception and show large inter-individual differences that may also be affected by time variables.  Patient instructions provided during this appointment: Patient Instructions   ____________________________________________________________________________________________  Appointment Policy Summary  It is our goal and responsibility to provide the medical community with  assistance in the evaluation and management of patients with chronic pain. Unfortunately our resources are limited. Because we do not have an unlimited amount of time, or available appointments, we are required to closely monitor and manage their use. The following rules exist to maximize their use:  Patient's responsibilities: 1. Punctuality:  At what time should I arrive? You should be physically present in our office 30 minutes before your scheduled appointment. Your scheduled appointment is with your assigned healthcare provider. However, it takes 5-10 minutes to be "  checked-in", and another 15 minutes for the nurses to do the admission. If you arrive to our office at the time you were given for your appointment, you will end up being at least 20-25 minutes late to your appointment with the provider. 2. Tardiness:  What happens if I arrive only a few minutes after my scheduled appointment time? You will need to reschedule your appointment. The cutoff is your appointment time. This is why it is so important that you arrive at least 30 minutes before that appointment. If you have an appointment scheduled for 10:00 AM and you arrive at 10:01, you will be required to reschedule your appointment.  3. Plan ahead:  Always assume that you will encounter traffic on your way in. Plan for it. If you are dependent on a driver, make sure they understand these rules and the need to arrive early. 4. Other appointments and responsibilities:  Avoid scheduling any other appointments before or after your pain clinic appointments.  5. Be prepared:  Write down everything that you need to discuss with your healthcare provider and give this information to the admitting nurse. Write down the medications that you will need refilled. Bring your pills and bottles (even the empty ones), to all of your appointments, except for those where a procedure is scheduled. 6. No children or pets:  Find someone to take care of them. It  is not appropriate to bring them in. 7. Scheduling changes:  We request "advanced notification" of any changes or cancellations. 8. Advanced notification:  Defined as a time period of more than 24 hours prior to the originally scheduled appointment. This allows for the appointment to be offered to other patients. 9. Rescheduling:  When a visit is rescheduled, it will require the cancellation of the original appointment. For this reason they both fall within the category of "Cancellations".  10. Cancellations:  They require advanced notification. Any cancellation less than 24 hours before the  appointment will be recorded as a "No Show". 11. No Show:  Defined as an unkept appointment where the patient failed to notify or declare to the practice their intention or inability to keep the appointment.  Corrective process for repeat offenders:  1. Tardiness: Three (3) episodes of rescheduling due to late arrivals will be recorded as one (1) "No Show". 2. Cancellation or reschedule: Three (3) cancellations or rescheduling will be recorded as one (1) "No Show". 3. "No Shows": Three (3) "No Shows" within a 12 month period will result in discharge from the practice. ____________________________________________________________________________________________   ______________________________________________________________________________________________  Specialty Pain Scale  Introduction:  There are significant differences in how pain is reported. The word pain usually refers to physical pain, but it is also a common synonym of suffering. The medical community uses a scale from 0 (zero) to 10 (ten) to report pain level. Zero (0) is described as "no pain", while ten (10) is described as "the worse pain you can imagine". The problem with this scale is that physical pain is reported along with suffering. Suffering refers to mental pain, or more often yet it refers to any unpleasant feeling, emotion or  aversion associated with the perception of harm or threat of harm. It is the psychological component of pain.  Pain Specialists prefer to separate the two components. The pain scale used by this practice is the Verbal Numerical Rating Scale (VNRS-11). This scale is for the physical pain only. DO NOT INCLUDE how your pain psychologically affects you. This scale is for adults  41 years of age and older. It has 11 (eleven) levels. The 1st level is 0/10. This means: "right now, I have no pain". In the context of pain management, it also means: "right now, my physical pain is under control with the current therapy".  General Information:  The scale should reflect your current level of pain. Unless you are specifically asked for the level of your worst pain, or your average pain. If you are asked for one of these two, then it should be understood that it is over the past 24 hours.  Levels 1 (one) through 5 (five) are described below, and can be treated as an outpatient. Ambulatory pain management facilities such as ours are more than adequate to treat these levels. Levels 6 (six) through 10 (ten) are also described below, however, these must be treated as a hospitalized patient. While levels 6 (six) and 7 (seven) may be evaluated at an urgent care facility, levels 8 (eight) through 10 (ten) constitute medical emergencies and as such, they belong in a hospital's emergency department. When having these levels (as described below), do not come to our office. Our facility is not equipped to manage these levels. Go directly to an urgent care facility or an emergency department to be evaluated.  Definitions:  Activities of Daily Living (ADL): Activities of daily living (ADL or ADLs) is a term used in healthcare to refer to people's daily self-care activities. Health professionals often use a person's ability or inability to perform ADLs as a measurement of their functional status, particularly in regard to people post  injury, with disabilities and the elderly. There are two ADL levels: Basic and Instrumental. Basic Activities of Daily Living (BADL  or BADLs) consist of self-care tasks that include: Bathing and showering; personal hygiene and grooming (including brushing/combing/styling hair); dressing; Toilet hygiene (getting to the toilet, cleaning oneself, and getting back up); eating and self-feeding (not including cooking or chewing and swallowing); functional mobility, often referred to as "transferring", as measured by the ability to walk, get in and out of bed, and get into and out of a chair; the broader definition (moving from one place to another while performing activities) is useful for people with different physical abilities who are still able to get around independently. Basic ADLs include the things many people do when they get up in the morning and get ready to go out of the house: get out of bed, go to the toilet, bathe, dress, groom, and eat. On the average, loss of function typically follows a particular order. Hygiene is the first to go, followed by loss of toilet use and locomotion. The last to go is the ability to eat. When there is only one remaining area in which the person is independent, there is a 62.9% chance that it is eating and only a 3.5% chance that it is hygiene. Instrumental Activities of Daily Living (IADL or IADLs) are not necessary for fundamental functioning, but they let an individual live independently in a community. IADL consist of tasks that include: cleaning and maintaining the house; home establishment and maintenance; care of others (including selecting and supervising caregivers); care of pets; child rearing; managing money; managing financials (investments, etc.); meal preparation and cleanup; shopping for groceries and necessities; moving within the community; safety procedures and emergency responses; health management and maintenance (taking prescribed medications); and  using the telephone or other form of communication.  Instructions:  Most patients tend to report their pain as a combination of two factors,  their physical pain and their psychosocial pain. This last one is also known as "suffering" and it is reflection of how physical pain affects you socially and psychologically. From now on, report them separately.  From this point on, when asked to report your pain level, report only your physical pain. Use the following table for reference.  Pain Clinic Pain Levels (0-5/10)  Pain Level Score  Description  No Pain 0   Mild pain 1 Nagging, annoying, but does not interfere with basic activities of daily living (ADL). Patients are able to eat, bathe, get dressed, toileting (being able to get on and off the toilet and perform personal hygiene functions), transfer (move in and out of bed or a chair without assistance), and maintain continence (able to control bladder and bowel functions). Blood pressure and heart rate are unaffected. A normal heart rate for a healthy adult ranges from 60 to 100 bpm (beats per minute).   Mild to moderate pain 2 Noticeable and distracting. Impossible to hide from other people. More frequent flare-ups. Still possible to adapt and function close to normal. It can be very annoying and may have occasional stronger flare-ups. With discipline, patients may get used to it and adapt.   Moderate pain 3 Interferes significantly with activities of daily living (ADL). It becomes difficult to feed, bathe, get dressed, get on and off the toilet or to perform personal hygiene functions. Difficult to get in and out of bed or a chair without assistance. Very distracting. With effort, it can be ignored when deeply involved in activities.   Moderately severe pain 4 Impossible to ignore for more than a few minutes. With effort, patients may still be able to manage work or participate in some social activities. Very difficult to concentrate. Signs of  autonomic nervous system discharge are evident: dilated pupils (mydriasis); mild sweating (diaphoresis); sleep interference. Heart rate becomes elevated (>115 bpm). Diastolic blood pressure (lower number) rises above 100 mmHg. Patients find relief in laying down and not moving.   Severe pain 5 Intense and extremely unpleasant. Associated with frowning face and frequent crying. Pain overwhelms the senses.  Ability to do any activity or maintain social relationships becomes significantly limited. Conversation becomes difficult. Pacing back and forth is common, as getting into a comfortable position is nearly impossible. Pain wakes you up from deep sleep. Physical signs will be obvious: pupillary dilation; increased sweating; goosebumps; brisk reflexes; cold, clammy hands and feet; nausea, vomiting or dry heaves; loss of appetite; significant sleep disturbance with inability to fall asleep or to remain asleep. When persistent, significant weight loss is observed due to the complete loss of appetite and sleep deprivation.  Blood pressure and heart rate becomes significantly elevated. Caution: If elevated blood pressure triggers a pounding headache associated with blurred vision, then the patient should immediately seek attention at an urgent or emergency care unit, as these may be signs of an impending stroke.    Emergency Department Pain Levels (6-10/10)  Emergency Room Pain 6 Severely limiting. Requires emergency care and should not be seen or managed at an outpatient pain management facility. Communication becomes difficult and requires great effort. Assistance to reach the emergency department may be required. Facial flushing and profuse sweating along with potentially dangerous increases in heart rate and blood pressure will be evident.   Distressing pain 7 Self-care is very difficult. Assistance is required to transport, or use restroom. Assistance to reach the emergency department will be required. Tasks  requiring coordination, such as bathing  and getting dressed become very difficult.   Disabling pain 8 Self-care is no longer possible. At this level, pain is disabling. The individual is unable to do even the most "basic" activities such as walking, eating, bathing, dressing, transferring to a bed, or toileting. Fine motor skills are lost. It is difficult to think clearly.   Incapacitating pain 9 Pain becomes incapacitating. Thought processing is no longer possible. Difficult to remember your own name. Control of movement and coordination are lost.   The worst pain imaginable 10 At this level, most patients pass out from pain. When this level is reached, collapse of the autonomic nervous system occurs, leading to a sudden drop in blood pressure and heart rate. This in turn results in a temporary and dramatic drop in blood flow to the brain, leading to a loss of consciousness. Fainting is one of the body's self defense mechanisms. Passing out puts the brain in a calmed state and causes it to shut down for a while, in order to begin the healing process.    Summary: 1. Refer to this scale when providing [place] with your pain level. 2. Be accurate and careful when reporting your pain level. This will help with your care. 3. Over-reporting your pain level will lead to loss of credibility. 4. Even a level of 1/10 means that there is pain and will be treated at our facility. 5. High, inaccurate reporting will be documented as "Symptom Exaggeration", leading to loss of credibility and suspicions of possible secondary gains such as obtaining more narcotics, or wanting to appear disabled, for fraudulent reasons. 6. Only pain levels of 5 or below will be seen at our facility. 7. Pain levels of 6 and above will be sent to the Emergency Department and the appointment cancelled. ______________________________________________________________________________________________

## 2018-03-23 NOTE — Patient Instructions (Signed)

## 2018-03-23 NOTE — Progress Notes (Signed)
Safety precautions to be maintained throughout the outpatient stay will include: orient to surroundings, keep bed in low position, maintain call bell within reach at all times, provide assistance with transfer out of bed and ambulation.  

## 2018-03-24 ENCOUNTER — Other Ambulatory Visit: Payer: Medicare Other | Admitting: Primary Care

## 2018-03-24 DIAGNOSIS — Z79891 Long term (current) use of opiate analgesic: Secondary | ICD-10-CM | POA: Diagnosis not present

## 2018-03-24 DIAGNOSIS — Z515 Encounter for palliative care: Secondary | ICD-10-CM

## 2018-03-24 DIAGNOSIS — G894 Chronic pain syndrome: Secondary | ICD-10-CM

## 2018-03-24 DIAGNOSIS — F325 Major depressive disorder, single episode, in full remission: Secondary | ICD-10-CM

## 2018-03-24 DIAGNOSIS — C61 Malignant neoplasm of prostate: Secondary | ICD-10-CM

## 2018-03-24 NOTE — Progress Notes (Signed)
[facility] Telephone: [phone] Fax: [phone]  PATIENT NAME: Logan Brown DOB: 29-Jul-1931 MRN: [id number]  PRIMARY CARE PROVIDER:   [name], MD  REFERRING PROVIDER:  [name], [address], [place] [address]  RESPONSIBLE PARTY:   Extended Emergency Contact Information Primary Emergency Contact: [name],Ginger  [place] Mobile Phone: [phone] Relation: Friend   Community palliative care was asked by referral from Dr. Melford Aase to see patient to address symptom management and goals of care. Patient was interviewed in his home, no other friends/family present.   ASSESSMENT and RECOMMENDATIONS:   Pain control. Recommend adding 12 g fentanyl patch every 72 hours to current 25 g. Patient experiencing 6/10 pain most of the time. Has recently been seen by pain management clinic for their intake appointment but will not see a physician to get any further pain management for two weeks. Patient has also a long history of benzodiazepine use and apparently that was not stopped with a wean. Ibuprofen prn  in renal dosing.   Depression/anxiety: Recommend beginning Cymbalta 30 mg daily. He is not on an anti-depressant. This will have a multi faceted benefit, treating pain, depression, and neuropathic pain associated with his compression fractures /back pain. Has historically also taken Xanax which has been recently stopped. Also has hydroxyzine and BuSpar. Would recommend streamlining/reducing  some of these medications and concentrating on reducing pain and depression.  Nutrition: Reports poor appetite. Again likely due to in some part to pain management and subsequent poor quality of life. Education provided to manage constipation if he begins receiving more narcotic. Nutritional supplements would be helpful.  Mobility: Patient is somewhat slow moving but has a cane. No longer drives. Fall risk due to pain, dog in  home and deconditioning/strength loss. States he has a lot of trouble with sleep due to back pain. Recommended a memory foam bed topper to improve support @HS . He also does not currently take a sleeping pill but may want to consider melatonin. Also checked orthostasis since he said he did get dizzy. No orthostasis was found. Instructed to hydrate and make slow changes in position  Goals of care: Patient lives with a friend and has some children who live in other states. He states his friend Ginger will be his acting POA but does not have any paperwork to that effect. Explained the reason that naming a power of attorney was important at this time while he has capacity and can make the decision. He voiced understanding. Left information  hand out that explains the duties of a power of attorney, allows him to select his choices, and then he can  take the form to a notary. Instructed him to give this document to his healthcare providers.  Palliative will continue to see for goals of care and symptom management. Return in 2-4 weeks. Patient will see pain management clinic on 04/05/2018.  I spent 60 minutes providing this consultation,  from 1000 to 1100. More than 50% of the time in this consultation was spent coordinating communication.   HISTORY OF PRESENT ILLNESS:  Logan Brown is a 85 y.o. year old male with multiple medical problems including arthritis, prostate cancer, bone pain, a fib. Palliative Care was asked to help address goals of care.   CODE STATUS: Full  PPS: 50% HOSPICE ELIGIBILITY/DIAGNOSIS: TBD  PAST MEDICAL HISTORY:  Past Medical History:  Diagnosis Date  . Arthritis   . Atrial fibrillation ([place])   . Bone cancer ([place])   .  Cardiomyopathy ([place])   . Hyperlipidemia   . Irregular heartbeat   . Prostate CA ([place])    1997  . Prostate cancer [facility])     SOCIAL HX:  Social History   Tobacco Use  . Smoking status: Former Smoker    Last attempt to quit: 03/14/1975    Years since  quitting: 43.0  . Smokeless tobacco: Never Used  Substance Use Topics  . Alcohol use: No    Alcohol/week: 0.0 standard drinks    Comment: occ    ALLERGIES:  Allergies  Allergen Reactions  . Ace Inhibitors Swelling    angioedema     PERTINENT MEDICATIONS:  Outpatient Encounter Medications as of 03/24/2018  Medication Sig  . aspirin EC 81 MG tablet Take 81 mg by mouth daily.  . Cholecalciferol (VITAMIN D3) 25 MCG (1000 UT) CAPS Vitamin D3 1,000 unit capsule  Take by oral route.  . fentaNYL (DURAGESIC - DOSED MCG/HR) 25 MCG/HR patch Place 1 patch (25 mcg total) onto the skin every 3 (three) days.  [name] Kitchen gabapentin (NEURONTIN) 600 MG tablet Take 1/2 to 1 tablet 2 to 3 x /day for chronic pain  . hydrOXYzine (ATARAX/VISTARIL) 25 MG tablet Take 1 tablet (25 mg total) by mouth every 6 (six) hours as needed for itching.  . Influenza vac split quadrivalent PF (FLUZONE HIGH-DOSE) 0.5 ML injection Fluzone High-Dose 2015-16 (PF) 180 mcg/0.5 mL intramuscular syringe  . labetalol (NORMODYNE) 100 MG tablet Take 100 mg by mouth 2 (two) times daily. Take 50mg  in the morning and 50mg  in the evening  . lisinopril (PRINIVIL,ZESTRIL) 10 MG tablet Take 1 tablet (10 mg total) by mouth daily.  . Multiple Vitamins-Minerals (CENTRUM SILVER ADULT 50+ PO) Take by mouth.  [name] Kitchen omeprazole (PRILOSEC) 20 MG capsule Take 1 capsule (20 mg total) by mouth daily.   No facility-administered encounter medications on file as of 03/24/2018.     PHYSICAL EXAM:  VS 98.2-70-18 142/85 sitting, 120/80 standing General: NAD, frail appearing, thin, good historian Cardiovascular: regular rate and rhythm, S1S2 Pulmonary: clear all fields, no cough Abdomen: soft, nontender, + bowel sounds, no constipation GU: no suprapubic tenderness, denies incontinence Extremities: no edema, no joint deformities, gait disturbance d/t pain  Skin: no rashes or wounds Neurological: Weakness but otherwise nonfocal, alert, oriented, x 3.   Cyndia Skeeters DNP, AGPCNP-BC

## 2018-03-25 ENCOUNTER — Other Ambulatory Visit: Payer: Self-pay | Admitting: Oncology

## 2018-03-25 MED ORDER — FENTANYL 12 MCG/HR TD PT72: MEDICATED_PATCH | TRANSDERMAL | 0 refills | Status: DC

## 2018-03-28 LAB — MAGNESIUM: Magnesium: 2.3 mg/dL (ref 1.6–2.3)

## 2018-03-28 LAB — COMP. METABOLIC PANEL (12)
AST: 27 IU/L (ref 0–40)
Albumin/Globulin Ratio: 1.9 (ref 1.2–2.2)
Albumin: 4.2 g/dL (ref 3.5–4.7)
Alkaline Phosphatase: 68 IU/L (ref 39–117)
BUN/Creatinine Ratio: 17 (ref 10–24)
BUN: 24 mg/dL (ref 8–27)
Bilirubin Total: 0.4 mg/dL (ref 0.0–1.2)
CALCIUM: 9.2 mg/dL (ref 8.6–10.2)
Chloride: 107 mmol/L — ABNORMAL HIGH (ref 96–106)
Creatinine, Ser: 1.45 mg/dL — ABNORMAL HIGH (ref 0.76–1.27)
GFR calc Af Amer: 51 mL/min/{1.73_m2} — ABNORMAL LOW (ref >59)
GFR calc non Af Amer: 44 mL/min/{1.73_m2} — ABNORMAL LOW (ref >59)
Globulin, Total: 2.2 g/dL (ref 1.5–4.5)
Glucose: 85 mg/dL (ref 65–99)
Potassium: 4.8 mmol/L (ref 3.5–5.2)
Sodium: 143 mmol/L (ref 134–144)
Total Protein: 6.4 g/dL (ref 6.0–8.5)

## 2018-03-28 LAB — 25-HYDROXY VITAMIN D LCMS D2+D3
25-Hydroxy, Vitamin D-2: <1 ng/mL
25-Hydroxy, Vitamin D-3: 41 ng/mL
25-Hydroxy, Vitamin D: 41 ng/mL

## 2018-03-28 LAB — VITAMIN B12: Vitamin B-12: 205 pg/mL — ABNORMAL LOW (ref 232–1245)

## 2018-03-28 LAB — SEDIMENTATION RATE: Sed Rate: 30 mm/hr (ref 0–30)

## 2018-03-28 LAB — C-REACTIVE PROTEIN: CRP: 6 mg/L (ref 0–10)

## 2018-03-29 LAB — COMPLIANCE DRUG ANALYSIS, UR

## 2018-03-30 ENCOUNTER — Encounter: Payer: Self-pay | Admitting: Nurse Practitioner

## 2018-03-30 DIAGNOSIS — E538 Deficiency of other specified B group vitamins: Secondary | ICD-10-CM | POA: Insufficient documentation

## 2018-04-01 NOTE — Progress Notes (Signed)
Patient's Name: Marte Celani  MRN: [id number]  Referring Provider: [name], MD  DOB: Oct 01, 1931  PCP: [name], MD  DOS: 04/05/2018  Note by: Gaspar Cola, MD  Service setting: Ambulatory outpatient  Specialty: Interventional Pain Management  Location: [facility] (AMB) Pain Management Facility    Patient type: Established   Primary Reason(s) for Visit: Encounter for evaluation before starting new chronic pain management plan of care (Level of risk: moderate) CC: Shoulder Pain (bilateral); Neck Pain; and Back Pain (lower)  HPI  Mr. Nuttall is a 85 y.o. year old, male patient, who comes today for a follow-up evaluation to review the test results and decide on a treatment plan. He has HTN (hypertension); Mixed hyperlipidemia; Prediabetes; Vitamin D deficiency; Medication management; Depression, major, in remission ([place]); CKD (chronic kidney disease) stage 3, GFR 30-59 ml/min ([place]); Nonischemic cardiomyopathy ([place]); Atrial fibrillation ([place]); Overweight (BMI 25.0-29.9); Prostate cancer ([place]); Hypocalcemia; Compression fracture of L2 lumbar vertebra, sequela; Scrotal itching; Anxiety; Chronic pain syndrome; Long term current use of opiate analgesic; Long term prescription benzodiazepine use; Pharmacologic therapy; Disorder of skeletal system; Problems influencing health status; Chronic low back pain (Primary Area of Pain) (Bilateral) (R>L) w/o sciatica; Low vitamin B12 level; Renal insufficiency; Cancer-related pain; GERD (gastroesophageal reflux disease); Neurogenic pain; 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 ([place]); History of prostate cancer; Osteopenia of spine; Osteopenia determined by x-ray; Osteoarthritis of facet joint of lumbar spine; Osteoarthritis involving multiple joints; DDD (degenerative disc disease), lumbar; Osteoarthritis of hip (Bilateral); Abnormal MRI, lumbar spine; Lumbar central spinal stenosis, w/o neurogenic claudication; 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 His primarily concern today is the Shoulder Pain (bilateral); Neck Pain; and Back Pain (lower)  Pain Assessment: Location: Right, Left Shoulder Radiating: c/o pain in neck and lower back; pt not certain if pain radiates or are independent of each other Onset: More than a month ago Duration: Chronic pain Quality: Aching, Constant Severity: 4 /10 (subjective, self-reported pain score)  Note: Reported level is compatible with observation.                         When using our objective Pain Scale, levels between 6 and 10/10 are said to belong in an emergency room, as it progressively worsens from a 6/10, described as severely limiting, requiring emergency care not usually available at an outpatient pain management facility. At a 6/10 level, communication becomes difficult and requires great effort. Assistance to reach the emergency department may be required. Facial flushing and profuse sweating along with potentially dangerous increases in heart rate and blood pressure will be evident. Effect on ADL: difficult to dress, walk, stand for long periods, clean self when using bathroom Timing: Constant Modifying factors: tylenol, changing positions BP: 132/87  HR: 75  Mr. Stankowski comes in today for a follow-up visit after his initial evaluation on 03/23/2018. Today we went over the results of his tests. These were explained in "Layman's terms". During today's appointment we went over my diagnostic impression, as well as the proposed treatment plan.  According to the patient his primary area of pain is in his lower back.  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  He did suffer a compression fracture and is status post kyphoplasty.  He has not done any recent  physical therapy.   His last MRI was May 2019.  In considering the treatment plan options, Mr. Speagle was reminded that I no longer take patients for medication management only. I asked him to let me know if he had no intention of taking advantage of the interventional therapies, so that we could make arrangements to provide this space to someone interested. I also made it clear that undergoing interventional therapies for the purpose of getting pain medications is very inappropriate on the part of a patient, and it will not be tolerated in this practice. This type of behavior would suggest true addiction and therefore it requires referral to an addiction specialist.   Further details on both, my assessment(s), as well as the proposed treatment plan, please see below.  Controlled Substance Pharmacotherapy Assessment REMS (Risk Evaluation and Mitigation Strategy)  Analgesic: Fentanyl patch 12.5 mcg/hr q72hrs. Today we will start him on Oxycodone/APAP 2.5/325 1 tab PO q 6hrs (10 mg/day of oxycodone)(15 MME) Highest recorded MME/day: 90 mg/day MME/day: 30 mg/day  Pill Count: None expected due to no prior prescriptions written by our practice. Rise Patience, RN  04/05/2018  1:34 PM  Signed Safety precautions to be maintained throughout the outpatient stay will include: orient to surroundings, keep bed in low position, maintain call bell within reach at all times, provide assistance with transfer out of bed and ambulation.    Pharmacokinetics: Liberation and absorption (onset of action): WNL Distribution (time to peak effect): WNL Metabolism and excretion (duration of action): WNL         Pharmacodynamics: Desired effects: Analgesia: Mr. Hulse reports >50% benefit. Functional ability: Patient reports that medication allows him to accomplish basic ADLs Clinically meaningful improvement in function (CMIF): Sustained CMIF goals met Perceived effectiveness: Described as relatively effective, allowing for increase in  activities of daily living (ADL) Undesirable effects: Side-effects or Adverse reactions: Nausea & sleepiness with the fentanyl patches. Monitoring: [place] PMP: Online review of the past 12-monthperiod previously conducted. Not applicable at this point since we have not taken over the patient's medication management yet. List of other Serum/Urine Drug Screening Test(s):  No results found. List of all UDS test(s) done:  Lab Results  Component Value Date   SUMMARY FINAL 03/23/2018   Last UDS on record: Summary  Date Value Ref Range Status  03/23/2018 FINAL  Final    Comment:    ==================================================================== TOXASSURE COMP DRUG ANALYSIS,UR ==================================================================== Test                             Result       Flag       Units Drug Present and Declared for Prescription Verification   Fentanyl                       9            EXPECTED   ng/mg creat   Norfentanyl                    40           EXPECTED   ng/mg creat    Source of fentanyl is a scheduled prescription medication,    including IV, patch, and transmucosal formulations. Norfentanyl    is an expected metabolite of fentanyl.   Gabapentin  PRESENT      EXPECTED Drug Present not Declared for Prescription Verification   Acetaminophen                  PRESENT      UNEXPECTED Drug Absent but Declared for Prescription Verification   Salicylate                     Not Detected UNEXPECTED    Aspirin, as indicated in the declared medication list, is not    always detected even when used as directed.   Hydroxyzine                    Not Detected UNEXPECTED ==================================================================== Test                      Result    Flag   Units      Ref Range   Creatinine              235              mg/dL      >=20 ==================================================================== Declared Medications:   The flagging and interpretation on this report are based on the  following declared medications.  Unexpected results may arise from  inaccuracies in the declared medications.  **Note: The testing scope of this panel includes these medications:  Fentanyl (Fentanyl Patch)  Gabapentin  Hydroxyzine  **Note: The testing scope of this panel does not include small to  moderate amounts of these reported medications:  Aspirin (Aspirin 81)  **Note: The testing scope of this panel does not include following  reported medications:  Labetalol  Lisinopril  Multivitamin (MVI)  Omeprazole  Vitamin D3 ==================================================================== For clinical consultation, please call [phone]. ====================================================================    UDS interpretation: No unexpected findings. Patient informed of the CDC guidelines and recommendations to stay away from the concomitant use of benzodiazepines and opioids due to the increased risk of respiratory depression and death. Medication Assessment Form: Patient introduced to form today Treatment compliance: Treatment may start today if patient agrees with proposed plan. Evaluation of compliance is not applicable at this point Risk Assessment Profile: Aberrant behavior: See initial evaluations. None observed or detected today Comorbid factors increasing risk of overdose: See initial evaluation. No additional risks detected today Opioid risk tool (ORT) (Total Score): 0 Personal History of Substance Abuse (SUD-Substance use disorder):  Alcohol: Negative  Illegal Drugs: Negative  Rx Drugs: Negative  ORT Risk Level calculation: Low Risk Risk of substance use disorder (SUD): Low Opioid Risk Tool - 04/05/18 1330      Family History of Substance Abuse   Alcohol  Negative    Illegal Drugs  Negative    Rx Drugs  Negative      Personal History of Substance Abuse   Alcohol  Negative    Illegal Drugs   Negative    Rx Drugs  Negative      Age   Age between 3-45 years   No      History of Preadolescent Sexual Abuse   History of Preadolescent Sexual Abuse  Negative or Male      Psychological Disease   Psychological Disease  Negative    Depression  Negative      Total Score   Opioid Risk Tool Scoring  0    Opioid Risk Interpretation  Low Risk      ORT Scoring interpretation table:  Score <3 = Low Risk  for SUD  Score between 4-7 = Moderate Risk for SUD  Score >8 = High Risk for Opioid Abuse   Risk Mitigation Strategies:  Patient opioid safety counseling: Completed today. Counseling provided to patient as per "Patient Counseling Document". Document signed by patient, attesting to counseling and understanding Patient-Prescriber Agreement (PPA): Obtained today.  Controlled substance notification to other providers: Written and sent today.  Pharmacologic Plan: Today we may be taking over the patient's pharmacological regimen. See below.             Laboratory Chemistry  Inflammation Markers (CRP: Acute Phase) (ESR: Chronic Phase) Lab Results  Component Value Date   CRP 6 03/23/2018   ESRSEDRATE 30 03/23/2018                         Rheumatology Markers No results found.  Renal Function Markers Lab Results  Component Value Date   BUN 24 03/23/2018   CREATININE 1.45 (H) 03/23/2018   BCR 17 03/23/2018   GFRAA 51 (L) 03/23/2018   GFRNONAA 44 (L) 03/23/2018                             Hepatic Function Markers Lab Results  Component Value Date   AST 27 03/23/2018   ALT 13 03/12/2018   ALBUMIN 4.2 03/23/2018   ALKPHOS 68 03/23/2018                        Electrolytes Lab Results  Component Value Date   NA 143 03/23/2018   K 4.8 03/23/2018   CL 107 (H) 03/23/2018   CALCIUM 9.2 03/23/2018   MG 2.3 03/23/2018                        Neuropathy Markers Lab Results  Component Value Date   VITAMINB12 205 (L) 03/23/2018   HGBA1C 5.9 (H) 06/01/2017                         CNS Tests No results found.  Bone Pathology Markers Lab Results  Component Value Date   VD25OH 39 06/01/2017   25OHVITD1 41 03/23/2018   25OHVITD2 <1.0 03/23/2018   25OHVITD3 41 03/23/2018                         Coagulation Parameters Lab Results  Component Value Date   PLT 168 03/12/2018                        Cardiovascular Markers Lab Results  Component Value Date   BNP 139.3 (H) 03/27/2017   HGB 12.1 (L) 03/12/2018   HCT 36.6 (L) 03/12/2018                         CA Markers No results found.  Note: Lab results reviewed.  Recent Diagnostic Imaging Review  Lumbosacral Imaging: Lumbar DG (Complete) 4+V:  Results for orders placed during the hospital encounter of 02/26/17  DG Lumbar Spine Complete   Narrative CLINICAL DATA:  Low back pain injury for several months, history prostate cancer  EXAM: LUMBAR SPINE - COMPLETE 4+ VIEW  COMPARISON:  11/03/2016  FINDINGS: Diffuse osseous demineralization.  Five non-rib-bearing lumbar vertebra.  Multilevel disc space narrowing and endplate spur formation.  Vertebral body  heights maintained.  No definite fracture, subluxation or bone destruction.  Minimal facet degenerative changes lower lumbar spine.  No definite spondylolysis  SI joints symmetric.  IMPRESSION: Osseous demineralization with degenerative disc disease changes and minimal facet degenerative changes of the lumbar spine.  No acute abnormalities.   Electronically Signed   By: Lavonia Dana [name]   On: 02/26/2017 15:23    Spine Imaging: Spine Outside MR Films:  Results for orders placed in visit on 07/20/17  MR OUTSIDE FILMS SPINE   Hip Imaging: Hip-R DG 2-3 views:  Results for orders placed during the hospital encounter of 11/03/16  DG Hip Unilat W or Wo Pelvis 2-3 Views Right   Narrative CLINICAL DATA:  Low back and right hip pain.  Fell today.  EXAM: DG HIP (WITH OR WITHOUT PELVIS) 2-3V RIGHT  COMPARISON:   None.  FINDINGS: Both hips are normally located. Moderate degenerative changes bilaterally. No acute hip fractures identified. The pubic symphysis and SI joints are intact. No definite pelvic fractures or bone lesions.  IMPRESSION: No acute hip or pelvic fractures.   Electronically Signed   By: Marijo Sanes [name]   On: 11/03/2016 16:31    Complexity Note: Imaging results reviewed. Results shared with Mr. Kelter, using Layman's terms.                         Meds   Current Outpatient Medications:  .  ALPRAZolam (XANAX) 0.5 MG tablet, Take 0.25 mg by mouth 2 (two) times daily as needed for anxiety., Disp: , Rfl:  .  aspirin EC 81 MG tablet, Take 81 mg by mouth daily., Disp: , Rfl:  .  Cholecalciferol (VITAMIN D3) 25 MCG (1000 UT) CAPS, Vitamin D3 1,000 unit capsule  Take by oral route., Disp: , Rfl:  .  gabapentin (NEURONTIN) 600 MG tablet, Take 1/2 to 1 tablet 2 to 3 x /day for chronic pain, Disp: 90 tablet, Rfl: 2 .  hydrOXYzine (ATARAX/VISTARIL) 25 MG tablet, Take 1 tablet (25 mg total) by mouth every 6 (six) hours as needed for itching., Disp: 30 tablet, Rfl: 1 .  labetalol (NORMODYNE) 100 MG tablet, Take 100 mg by mouth 2 (two) times daily. Take 26m in the morning and 541min the evening, Disp: , Rfl:  .  lisinopril (PRINIVIL,ZESTRIL) 10 MG tablet, Take 1 tablet (10 mg total) by mouth daily., Disp: 90 tablet, Rfl: 1 .  Multiple Vitamins-Minerals (CENTRUM SILVER ADULT 50+ PO), Take by mouth., Disp: , Rfl:  .  omeprazole (PRILOSEC) 20 MG capsule, Take 1 capsule (20 mg total) by mouth daily., Disp: 90 capsule, Rfl: 1 .  Cyanocobalamin (VITAMIN B-12) 5000 MCG SUBL, Place 1 tablet (5,000 mcg total) under the tongue daily for 30 days., Disp: 30 each, Rfl: 0 .  oxycodone-acetaminophen (PERCOCET) 2.5-325 MG tablet, Take 1 tablet by mouth every 6 (six) hours as needed for up to 30 days for pain. Must last 30 days, Disp: 120 tablet, Rfl: 0  ROS  Constitutional: Denies any fever or  chills Gastrointestinal: No reported hemesis, hematochezia, vomiting, or acute GI distress Musculoskeletal: Denies any acute onset joint swelling, redness, loss of ROM, or weakness Neurological: No reported episodes of acute onset apraxia, aphasia, dysarthria, agnosia, amnesia, paralysis, loss of coordination, or loss of consciousness  Allergies  Mr. PiLennartzs allergic to ace inhibitors and hydrocodone.  PFSH  Drug: Mr. PiBeylreports no history of drug use. Alcohol:  reports no history of alcohol  use. Tobacco:  reports that he quit smoking about 43 years ago. He has never used smokeless tobacco. Medical:  has a past medical history of Arthritis, Atrial fibrillation ([place]), Bone cancer ([place]), Cardiomyopathy ([place]), Hyperlipidemia, Irregular heartbeat, Prostate CA ([place]), and Prostate cancer ([place]). Surgical: Mr. Erbes  has a past surgical history that includes Eye surgery (Bilateral); Cataract extraction; Prostate surgery; and Wrist foreign body removal. Family: family history is not on file.  Constitutional Exam  General appearance: Well nourished, well developed, and well hydrated. In no apparent acute distress Vitals:   04/05/18 1327  BP: 132/87  Pulse: 75  Resp: 18  Temp: 97.6 F (36.4 C)  TempSrc: Oral  SpO2: 97%  Weight: 177 lb (80.3 kg)  Height: _0  (1.702 m)   BMI Assessment: Estimated body mass index is 27.72 kg/m as calculated from the following:   Height as of this encounter: _1  (1.702 m).   Weight as of this encounter: 177 lb (80.3 kg).  BMI interpretation table: BMI level Category Range association with higher incidence of chronic pain  <18 kg/m2 Underweight   18.5-24.9 kg/m2 Ideal body weight   25-29.9 kg/m2 Overweight Increased incidence by 20%  30-34.9 kg/m2 Obese (Class I) Increased incidence by 68%  35-39.9 kg/m2 Severe obesity (Class II) Increased incidence by 136%  >40 kg/m2 Extreme obesity (Class III) Increased incidence by 254%   Patient's current  BMI Ideal Body weight  Body mass index is 27.72 kg/m. Ideal body weight: 66.1 kg (145 lb 11.6 oz) Adjusted ideal body weight: 71.8 kg (158 lb 3.8 oz)   BMI Readings from Last 4 Encounters:  04/05/18 27.72 kg/m  03/23/18 27.72 kg/m  02/05/18 27.27 kg/m  01/12/18 26.55 kg/m   Wt Readings from Last 4 Encounters:  04/05/18 177 lb (80.3 kg)  03/23/18 177 lb (80.3 kg)  02/05/18 174 lb 2 oz (79 kg)  01/12/18 169 lb 8 oz (76.9 kg)  Psych/Mental status: Alert, oriented x 3 (person, place, & time)       Eyes: PERLA Respiratory: No evidence of acute respiratory distress  Cervical Spine Area Exam  Skin & Axial Inspection: No masses, redness, edema, swelling, or associated skin lesions Alignment: Symmetrical Functional ROM: Unrestricted ROM      Stability: No instability detected Muscle Tone/Strength: Functionally intact. No obvious neuro-muscular anomalies detected. Sensory (Neurological): Unimpaired Palpation: No palpable anomalies              Upper Extremity (UE) Exam    Side: Right upper extremity  Side: Left upper extremity  Skin & Extremity Inspection: Skin color, temperature, and hair growth are WNL. No peripheral edema or cyanosis. No masses, redness, swelling, asymmetry, or associated skin lesions. No contractures.  Skin & Extremity Inspection: Skin color, temperature, and hair growth are WNL. No peripheral edema or cyanosis. No masses, redness, swelling, asymmetry, or associated skin lesions. No contractures.  Functional ROM: Unrestricted ROM          Functional ROM: Unrestricted ROM          Muscle Tone/Strength: Functionally intact. No obvious neuro-muscular anomalies detected.  Muscle Tone/Strength: Functionally intact. No obvious neuro-muscular anomalies detected.  Sensory (Neurological): Unimpaired          Sensory (Neurological): Unimpaired          Palpation: No palpable anomalies              Palpation: No palpable anomalies              Provocative  Test(s):  Phalen's  test: deferred Tinel's test: deferred Apley's scratch test (touch opposite shoulder):  Action 1 (Across chest): deferred Action 2 (Overhead): deferred Action 3 (LB reach): deferred   Provocative Test(s):  Phalen's test: deferred Tinel's test: deferred Apley's scratch test (touch opposite shoulder):  Action 1 (Across chest): deferred Action 2 (Overhead): deferred Action 3 (LB reach): deferred    Thoracic Spine Area Exam  Skin & Axial Inspection: No masses, redness, or swelling Alignment: Symmetrical Functional ROM: Unrestricted ROM Stability: No instability detected Muscle Tone/Strength: Functionally intact. No obvious neuro-muscular anomalies detected. Sensory (Neurological): Unimpaired Muscle strength & Tone: No palpable anomalies  Lumbar Spine Area Exam  Skin & Axial Inspection: He is currently using a back brace. Alignment: Asymmetric Functional ROM: Minimal ROM affecting both sides Stability: No instability detected Muscle Tone/Strength: Increased muscle tone over affected area Sensory (Neurological): Movement-associated pain Palpation: Complains of area being tender to palpation       Provocative Tests: Hyperextension/rotation test: (+) bilaterally for facet joint pain. Lumbar quadrant test (Kemp's test): (+) bilaterally for facet joint pain. Lateral bending test: deferred today       Patrick's Maneuver: deferred today                   FABER* test: deferred today                   S-I anterior distraction/compression test: deferred today         S-I lateral compression test: deferred today         S-I Thigh-thrust test: deferred today         S-I Gaenslen's test: deferred today         *(Flexion, ABduction and External Rotation)  Gait & Posture Assessment  Ambulation: Limited Gait: Antalgic gait (limping) Posture: Difficulty standing up straight, due to pain   Lower Extremity Exam    Side: Right lower extremity  Side: Left lower extremity  Stability: No  instability observed          Stability: No instability observed          Skin & Extremity Inspection: Skin color, temperature, and hair growth are WNL. No peripheral edema or cyanosis. No masses, redness, swelling, asymmetry, or associated skin lesions. No contractures.  Skin & Extremity Inspection: Skin color, temperature, and hair growth are WNL. No peripheral edema or cyanosis. No masses, redness, swelling, asymmetry, or associated skin lesions. No contractures.  Functional ROM: Decreased ROM for all joints of the lower extremity          Functional ROM: Decreased ROM for all joints of the lower extremity          Muscle Tone/Strength: Functionally intact. No obvious neuro-muscular anomalies detected.  Muscle Tone/Strength: Functionally intact. No obvious neuro-muscular anomalies detected.  Sensory (Neurological): Unimpaired        Sensory (Neurological): Unimpaired        DTR: Patellar: deferred today Achilles: deferred today Plantar: deferred today  DTR: Patellar: deferred today Achilles: deferred today Plantar: deferred today  Palpation: No palpable anomalies  Palpation: No palpable anomalies   Assessment & Plan  Primary Diagnosis & Pertinent Problem List: The primary encounter diagnosis was Chronic pain syndrome. 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  Visit Diagnosis: 1. Chronic pain syndrome   2. Cancer-related pain   3. 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 Chronic low back pain (Primary Area of Pain) (Bilateral) (R>L) w/o sciatica   5. Metastatic cancer to spine ([place])   6. Abnormal MRI, lumbar spine   7. Compression fracture of L2 lumbar vertebra, sequela   8. History of kyphoplasty (L2)   9. DDD (degenerative disc disease), lumbar   10. Osteoarthritis of facet joint of lumbar spine   11. Lumbar facet hypertrophy   12. Lumbar facet joint syndrome (Bilateral)   13. Lumbar central spinal stenosis, w/o neurogenic claudication   14. Lumbar foraminal stenosis (Bilateral: L4-5) (Right: L1-2, L5-S1) (Left: L2-3, L3-4)   15. Neurogenic pain   16. History of prostate cancer   17. Prostate cancer ([place])   18. Disorder of skeletal system   19. Pharmacologic therapy   20. Long term current use of opiate analgesic   21. Long term prescription benzodiazepine use   22. Problems influencing health status   23. Renal insufficiency   24. Low vitamin B12 level    Problems updated and reviewed during this visit: No problems updated.  Plan of Care  Pharmacotherapy (Medications Ordered): Meds ordered this encounter  Medications  . Cyanocobalamin (VITAMIN B-12) 5000 MCG SUBL    Sig: Place 1 tablet (5,000 mcg total) under the tongue daily for 30 days.    Dispense:  30 each    Refill:  0    Do not place medication on "Automatic Refill". Fill one day early if pharmacy is closed on scheduled refill date.  [name] Kitchen oxycodone-acetaminophen (PERCOCET) 2.5-325 MG tablet    Sig: Take 1 tablet by mouth every 6 (six) hours as needed for up to 30 days for pain. Must last 30 days    Dispense:  120 tablet     Refill:  0    [place] STOP ACT - Not applicable. Fill one day early if pharmacy is closed on scheduled refill date. Do not fill until: 04/06/18. Must last 30 days. To last until: 05/06/18.    Procedure Orders     Lumbar Transforaminal Epidural Lab Orders  No laboratory test(s) ordered today   Imaging Orders  No imaging studies ordered today   Referral Orders  No referral(s) requested today    Pharmacological management options:  Opioid Analgesics: We'll take over management today. See above orders Membrane stabilizer: We have discussed the possibility of optimizing this mode of therapy, if tolerated Muscle relaxant: We have discussed the possibility of a trial NSAID: We have discussed the possibility of a trial Other analgesic(s): To be determined at a later time   Interventional management options: Planned, scheduled, and/or pending:    Diagnostic bilateral transforaminal L2 LESI    Considering:   Diagnostic IV lidocaine infusion  Diagnostic bilateral transforaminal L2 LESI  Diagnostic bilateral lumbar facet nerve block  Possible bilateral lumbar facet RFA    PRN Procedures:   None at this time   Provider-requested  follow-up: Return for Procedure (w/ sedation): (B) L2 TFESI #1.  Future Appointments  Date Time Provider [place]  04/12/2018  8:00 AM [facility] None  04/13/2018  3:00 PM Wyatt Portela, MD [facility] None  04/13/2018  3:30 PM [facility] [place] None  04/20/2018  8:15 AM [name], MD [facility] None  04/20/2018  3:30 PM [name], MD [facility] None  04/26/2018  8:30 AM Shelton Silvas, PT [facility] None  04/28/2018  1:45 PM Shelton Silvas, PT [facility] None  05/04/2018  1:00 PM Shelton Silvas, PT [facility] None  05/06/2018  3:15 PM Shelton Silvas, PT [facility] None  05/11/2018  1:00 PM Shelton Silvas, PT [facility] None  05/13/2018  1:45 PM Shelton Silvas, PT [facility] None  05/18/2018  1:00 PM Shelton Silvas, PT  [facility] None  05/20/2018 10:30 AM Shelton Silvas, PT [facility] None  05/25/2018  1:00 PM Shelton Silvas, PT [facility] None  05/27/2018  1:00 PM Shelton Silvas, PT [facility] None  06/01/2018  1:00 PM Shelton Silvas, PT [facility] None  06/03/2018  1:00 PM Shelton Silvas, PT [facility] None  06/08/2018  1:00 PM Shelton Silvas, PT [facility] None  01/18/2019  2:00 PM Liane Comber, NP [facility] None    Primary Care Physician: [name], MD Location: [facility] Note by: Gaspar Cola, MD Date: 04/05/2018; Time: 3:19 PM

## 2018-04-02 ENCOUNTER — Telehealth: Payer: Self-pay | Admitting: Primary Care

## 2018-04-02 NOTE — Telephone Encounter (Signed)
T/c from patient requesting shower bench. Also states his 25 mcg fentanyl patches had a $100 co pay and he could not afford this. The [place] told him he was being weaned, assuming the 12 mcg patch was a change not an addition. This was incorrect as the patient needed increased pain control. The patient did not notify this writer he was not able to afford  the 25 mcg patches a week ago. Instructed on  Good RX program, a discount coupon. He could change pharmacies and get the drug for $27. He will call [place] and have prescription transferred if possible.  He was appreciative, will f/u with home visit next week, and seek source for shower chair.

## 2018-04-05 ENCOUNTER — Ambulatory Visit: Payer: Medicare Other | Attending: Pain Medicine | Admitting: Pain Medicine

## 2018-04-05 ENCOUNTER — Other Ambulatory Visit: Payer: Self-pay

## 2018-04-05 ENCOUNTER — Encounter: Payer: Self-pay | Admitting: Pain Medicine

## 2018-04-05 VITALS — BP 132/87 | HR 75 | Temp 97.6°F | Resp 18 | Ht 67.0 in | Wt 177.0 lb

## 2018-04-05 DIAGNOSIS — M899 Disorder of bone, unspecified: Secondary | ICD-10-CM | POA: Diagnosis not present

## 2018-04-05 DIAGNOSIS — N289 Disorder of kidney and ureter, unspecified: Secondary | ICD-10-CM | POA: Diagnosis not present

## 2018-04-05 DIAGNOSIS — M792 Neuralgia and neuritis, unspecified: Secondary | ICD-10-CM | POA: Diagnosis not present

## 2018-04-05 DIAGNOSIS — K219 Gastro-esophageal reflux disease without esophagitis: Secondary | ICD-10-CM | POA: Insufficient documentation

## 2018-04-05 DIAGNOSIS — R937 Abnormal findings on diagnostic imaging of other parts of musculoskeletal system: Secondary | ICD-10-CM | POA: Diagnosis not present

## 2018-04-05 DIAGNOSIS — G894 Chronic pain syndrome: Secondary | ICD-10-CM

## 2018-04-05 DIAGNOSIS — Z79891 Long term (current) use of opiate analgesic: Secondary | ICD-10-CM | POA: Diagnosis not present

## 2018-04-05 DIAGNOSIS — M5136 Other intervertebral disc degeneration, lumbar region: Secondary | ICD-10-CM | POA: Insufficient documentation

## 2018-04-05 DIAGNOSIS — Z8546 Personal history of malignant neoplasm of prostate: Secondary | ICD-10-CM | POA: Diagnosis present

## 2018-04-05 DIAGNOSIS — E538 Deficiency of other specified B group vitamins: Secondary | ICD-10-CM | POA: Diagnosis not present

## 2018-04-05 DIAGNOSIS — C61 Malignant neoplasm of prostate: Secondary | ICD-10-CM

## 2018-04-05 DIAGNOSIS — Z79899 Other long term (current) drug therapy: Secondary | ICD-10-CM

## 2018-04-05 DIAGNOSIS — C7951 Secondary malignant neoplasm of bone: Secondary | ICD-10-CM

## 2018-04-05 DIAGNOSIS — M150 Primary generalized (osteo)arthritis: Secondary | ICD-10-CM

## 2018-04-05 DIAGNOSIS — S32020S Wedge compression fracture of second lumbar vertebra, sequela: Secondary | ICD-10-CM | POA: Diagnosis not present

## 2018-04-05 DIAGNOSIS — M47816 Spondylosis without myelopathy or radiculopathy, lumbar region: Secondary | ICD-10-CM

## 2018-04-05 DIAGNOSIS — M159 Polyosteoarthritis, unspecified: Secondary | ICD-10-CM | POA: Insufficient documentation

## 2018-04-05 DIAGNOSIS — R7989 Other specified abnormal findings of blood chemistry: Secondary | ICD-10-CM

## 2018-04-05 DIAGNOSIS — Z789 Other specified health status: Secondary | ICD-10-CM

## 2018-04-05 DIAGNOSIS — M48061 Spinal stenosis, lumbar region without neurogenic claudication: Secondary | ICD-10-CM | POA: Diagnosis not present

## 2018-04-05 DIAGNOSIS — G8929 Other chronic pain: Secondary | ICD-10-CM

## 2018-04-05 DIAGNOSIS — G893 Neoplasm related pain (acute) (chronic): Secondary | ICD-10-CM | POA: Diagnosis not present

## 2018-04-05 DIAGNOSIS — M8588 Other specified disorders of bone density and structure, other site: Secondary | ICD-10-CM | POA: Insufficient documentation

## 2018-04-05 DIAGNOSIS — M51369 Other intervertebral disc degeneration, lumbar region without mention of lumbar back pain or lower extremity pain: Secondary | ICD-10-CM | POA: Insufficient documentation

## 2018-04-05 DIAGNOSIS — Z9889 Other specified postprocedural states: Secondary | ICD-10-CM | POA: Diagnosis not present

## 2018-04-05 DIAGNOSIS — M160 Bilateral primary osteoarthritis of hip: Secondary | ICD-10-CM | POA: Insufficient documentation

## 2018-04-05 DIAGNOSIS — M545 Low back pain, unspecified: Secondary | ICD-10-CM

## 2018-04-05 DIAGNOSIS — M8580 Other specified disorders of bone density and structure, unspecified site: Secondary | ICD-10-CM | POA: Insufficient documentation

## 2018-04-05 MED ORDER — OXYCODONE-ACETAMINOPHEN 2.5-325 MG PO TABS: 1.0000 | ORAL_TABLET | Freq: Four times a day (QID) | ORAL | 0 refills | Status: DC | PRN

## 2018-04-05 MED ORDER — VITAMIN B-12 5000 MCG SL SUBL: 5000.0000 ug | SUBLINGUAL_TABLET | Freq: Every day | SUBLINGUAL | 0 refills | Status: AC

## 2018-04-05 NOTE — Patient Instructions (Addendum)
Discontinue the use of Alprazolam Duanne Moron). Consult your prescribing physician for guidelines on how to safely stop the medication. ____________________________________________________________________________________________  Preparing for Procedure with Sedation  Instructions: . Oral Intake: Do not eat or drink anything for at least 8 hours prior to your procedure. . Transportation: Public transportation is not allowed. Bring an adult driver. The driver must be physically present in our waiting room before any procedure can be started. [name] Kitchen Physical Assistance: Bring an adult physically capable of assisting you, in the event you need help. This adult should keep you company at home for at least 6 hours after the procedure. . Blood Pressure Medicine: Take your blood pressure medicine with a sip of water the morning of the procedure. . Blood thinners: Notify our staff if you are taking any blood thinners. Depending on which one you take, there will be specific instructions on how and when to stop it. . Diabetics on insulin: Notify the staff so that you can be scheduled 1st case in the morning. If your diabetes requires high dose insulin, take only  of your normal insulin dose the morning of the procedure and notify the staff that you have done so. . Preventing infections: Shower with an antibacterial soap the morning of your procedure. . Build-up your immune system: Take 1000 mg of Vitamin C with every meal (3 times a day) the day prior to your procedure. [name] Kitchen Antibiotics: Inform the staff if you have a condition or reason that requires you to take antibiotics before dental procedures. . Pregnancy: If you are pregnant, call and cancel the procedure. . Sickness: If you have a cold, fever, or any active infections, call and cancel the procedure. . Arrival: You must be in the facility at least 30 minutes prior to your scheduled procedure. . Children: Do not bring children with you. . Dress appropriately:  Bring dark clothing that you would not mind if they get stained. . Valuables: Do not bring any jewelry or valuables.  Procedure appointments are reserved for interventional treatments only. [name] Kitchen No Prescription Refills. . No medication changes will be discussed during procedure appointments. . No disability issues will be discussed.  Reasons to call and reschedule or cancel your procedure: (Following these recommendations will minimize the risk of a serious complication.) . Surgeries: Avoid having procedures within 2 weeks of any surgery. (Avoid for 2 weeks before or after any surgery). . Flu Shots: Avoid having procedures within 2 weeks of a flu shots or . (Avoid for 2 weeks before or after immunizations). . Barium: Avoid having a procedure within 7-10 days after having had a radiological study involving the use of radiological contrast. (Myelograms, Barium swallow or enema study). . Heart attacks: Avoid any elective procedures or surgeries for the initial 6 months after a "Myocardial Infarction" (Heart Attack). . Blood thinners: It is imperative that you stop these medications before procedures. Let us know if you if you take any blood thinner.  . Infection: Avoid procedures during or within two weeks of an infection (including chest colds or gastrointestinal problems). Symptoms associated with infections include: Localized redness, fever, chills, night sweats or profuse sweating, burning sensation when voiding, cough, congestion, stuffiness, runny nose, sore throat, diarrhea, nausea, vomiting, cold or Flu symptoms, recent or current infections. It is specially important if the infection is over the area that we intend to treat. [name] Kitchen Heart and lung problems: Symptoms that may suggest an active cardiopulmonary problem include: cough, chest pain, breathing difficulties or shortness of breath, dizziness, ankle  swelling, uncontrolled high or unusually low blood pressure, and/or palpitations. If you are  experiencing any of these symptoms, cancel your procedure and contact your primary care physician for an evaluation.  Remember:  Regular Business hours are:  Monday to Thursday 8:00 AM to 4:00 PM  Provider's Schedule: [name], MD:  Procedure days: Tuesday and Thursday 7:30 AM to 4:00 PM  Gillis Santa, MD:  Procedure days: Monday and Wednesday 7:30 AM to 4:00 PM ____________________________________________________________________________________________   ____________________________________________________________________________________________  General Risks and Possible Complications  Patient Responsibilities: It is important that you read this as it is part of your informed consent. It is our duty to inform you of the risks and possible complications associated with treatments offered to you. It is your responsibility as a patient to read this and to ask questions about anything that is not clear or that you believe was not covered in this document.  Patient's Rights: You have the right to refuse treatment. You also have the right to change your mind, even after initially having agreed to have the treatment done. However, under this last option, if you wait until the last second to change your mind, you may be charged for the materials used up to that point.  Introduction: Medicine is not an Chief Strategy Officer. Everything in Medicine, including the lack of treatment(s), carries the potential for danger, harm, or loss (which is by definition: Risk). In Medicine, a complication is a secondary problem, condition, or disease that can aggravate an already existing one. All treatments carry the risk of possible complications. The fact that a side effects or complications occurs, does not imply that the treatment was conducted incorrectly. It must be clearly understood that these can happen even when everything is done following the highest safety standards.  No treatment: You can choose  not to proceed with the proposed treatment alternative. The "PRO(s)" would include: avoiding the risk of complications associated with the therapy. The "CON(s)" would include: not getting any of the treatment benefits. These benefits fall under one of three categories: diagnostic; therapeutic; and/or palliative. Diagnostic benefits include: getting information which can ultimately lead to improvement of the disease or symptom(s). Therapeutic benefits are those associated with the successful treatment of the disease. Finally, palliative benefits are those related to the decrease of the primary symptoms, without necessarily curing the condition (example: decreasing the pain from a flare-up of a chronic condition, such as incurable terminal cancer).  General Risks and Complications: These are associated to most interventional treatments. They can occur alone, or in combination. They fall under one of the following six (6) categories: no benefit or worsening of symptoms; bleeding; infection; nerve damage; allergic reactions; and/or death. 1. No benefits or worsening of symptoms: In Medicine there are no guarantees, only probabilities. No healthcare provider can ever guarantee that a medical treatment will work, they can only state the probability that it may. Furthermore, there is always the possibility that the condition may worsen, either directly, or indirectly, as a consequence of the treatment. 2. Bleeding: This is more common if the patient is taking a blood thinner, either prescription or over the counter (example: Goody Powders, Fish oil, Aspirin, Garlic, etc.), or if suffering a condition associated with impaired coagulation (example: Hemophilia, cirrhosis of the liver, low platelet counts, etc.). However, even if you do not have one on these, it can still happen. If you have any of these conditions, or take one of these drugs, make sure to notify your treating physician. 3.  Infection: This is more common  in patients with a compromised immune system, either due to disease (example: diabetes, cancer, human immunodeficiency virus [HIV], etc.), or due to medications or treatments (example: therapies used to treat cancer and rheumatological diseases). However, even if you do not have one on these, it can still happen. If you have any of these conditions, or take one of these drugs, make sure to notify your treating physician. 4. Nerve Damage: This is more common when the treatment is an invasive one, but it can also happen with the use of medications, such as those used in the treatment of cancer. The damage can occur to small secondary nerves, or to large primary ones, such as those in the spinal cord and brain. This damage may be temporary or permanent and it may lead to impairments that can range from temporary numbness to permanent paralysis and/or brain death. 5. Allergic Reactions: Any time a substance or material comes in contact with our body, there is the possibility of an allergic reaction. These can range from a mild skin rash (contact dermatitis) to a severe systemic reaction (anaphylactic reaction), which can result in death. 6. Death: In general, any medical intervention can result in death, most of the time due to an unforeseen complication. ____________________________________________________________________________________________  ____________________________________________________________________________________________  Medication Rules  Purpose: To inform patients, and their family members, of our rules and regulations.  Applies to: All patients receiving prescriptions (written or electronic).  Pharmacy of record: Pharmacy where electronic prescriptions will be sent. If written prescriptions are taken to a different pharmacy, please inform the nursing staff. The pharmacy listed in the electronic medical record should be the one where you would like electronic prescriptions to be  sent.  Electronic prescriptions: In compliance with the [place] (STOP) Act of 2017 (Session Lanny Cramp [id number]), effective March 10, 2018, all controlled substances must be electronically prescribed. Calling prescriptions to the pharmacy will cease to exist.  Prescription refills: Only during scheduled appointments. Applies to all prescriptions.  NOTE: The following applies primarily to controlled substances (Opioid* Pain Medications).   Patient's responsibilities: 1. Pain Pills: Bring all pain pills to every appointment (except for procedure appointments). 2. Pill Bottles: Bring pills in original pharmacy bottle. Always bring the newest bottle. Bring bottle, even if empty. 3. Medication refills: You are responsible for knowing and keeping track of what medications you take and those you need refilled. The day before your appointment: write a list of all prescriptions that need to be refilled. The day of the appointment: give the list to the admitting nurse. Prescriptions will be written only during appointments. If you forget a medication: it will not be "Called in", "Faxed", or "electronically sent". You will need to get another appointment to get these prescribed. No early refills. Do not call asking to have your prescription filled early. 4. Prescription Accuracy: You are responsible for carefully inspecting your prescriptions before leaving our office. Have the discharge nurse carefully go over each prescription with you, before taking them home. Make sure that your name is accurately spelled, that your address is correct. Check the name and dose of your medication to make sure it is accurate. Check the number of pills, and the written instructions to make sure they are clear and accurate. Make sure that you are given enough medication to last until your next medication refill appointment. 5. Taking Medication: Take medication as prescribed. When it  comes to controlled substances, taking less pills or less  frequently than prescribed is permitted and encouraged. Never take more pills than instructed. Never take medication more frequently than prescribed.  6. Inform other Doctors: Always inform, all of your healthcare providers, of all the medications you take. 7. Pain Medication from other Providers: You are not allowed to accept any additional pain medication from any other Doctor or Healthcare provider. There are two exceptions to this rule. (see below) In the event that you require additional pain medication, you are responsible for notifying us, as stated below. 8. Medication Agreement: You are responsible for carefully reading and following our Medication Agreement. This must be signed before receiving any prescriptions from our practice. Safely store a copy of your signed Agreement. Violations to the Agreement will result in no further prescriptions. (Additional copies of our Medication Agreement are available upon request.) 9. Laws, Rules, & Regulations: All patients are expected to follow all Federal and [employer], [employer], Rules, [employer] [facility] [employer]. Ignorance of the Laws does not constitute a valid excuse. The use of any illegal substances is prohibited. 10. Adopted CDC guidelines & recommendations: Target dosing levels will be at or below 60 MME/day. Use of benzodiazepines** is not recommended.  Exceptions: There are only two exceptions to the rule of not receiving pain medications from other Healthcare Providers. 1. Exception #1 (Emergencies): In the event of an emergency (i.e.: accident requiring emergency care), you are allowed to receive additional pain medication. However, you are responsible for: As soon as you are able, call our office [phone], at any time of the day or night, and leave a message stating your name, the date and nature of the emergency, and the name and dose of the medication prescribed. In the event that your call  is answered by a member of our staff, make sure to document and save the date, time, and the name of the person that took your information.  2. Exception #2 (Planned Surgery): In the event that you are scheduled by another doctor or dentist to have any type of surgery or procedure, you are allowed (for a period no longer than 30 days), to receive additional pain medication, for the acute post-op pain. However, in this case, you are responsible for picking up a copy of our "Post-op Pain Management for Surgeons" handout, and giving it to your surgeon or dentist. This document is available at our office, and does not require an appointment to obtain it. Simply go to our office during business hours (Monday-Thursday from 8:00 AM to 4:00 PM) (Friday 8:00 AM to 12:00 Noon) or if you have a scheduled appointment with [place], prior to your surgery, and ask for it by name. In addition, you will need to provide [place] with your name, name of your surgeon, type of surgery, and date of procedure or surgery.  *Opioid medications include: morphine, codeine, oxycodone, oxymorphone, hydrocodone, hydromorphone, meperidine, tramadol, tapentadol, buprenorphine, fentanyl, methadone. **Benzodiazepine medications include: diazepam (Valium), alprazolam (Xanax), clonazepam (Klonopine), lorazepam (Ativan), clorazepate (Tranxene), chlordiazepoxide (Librium), estazolam (Prosom), oxazepam (Serax), temazepam (Restoril), triazolam (Halcion) (Last updated: 05/07/2017) ____________________________________________________________________________________________   ____________________________________________________________________________________________  Medication Recommendations and Reminders  Applies to: All patients receiving prescriptions (written and/or electronic).  Medication Rules & Regulations: These rules and regulations exist for your safety and that of others. They are not flexible and neither are we. Dismissing or ignoring  them will be considered "non-compliance" with medication therapy, resulting in complete and irreversible termination of such therapy. (See document titled "Medication Rules" for more details.) In all conscience, because of safety  reasons, we cannot continue providing a therapy where the patient does not follow instructions.  Pharmacy of record:   Definition: This is the pharmacy where your electronic prescriptions will be sent.   We do not endorse any particular pharmacy.  You are not restricted in your choice of pharmacy.  The pharmacy listed in the electronic medical record should be the one where you want electronic prescriptions to be sent.  If you choose to change pharmacy, simply notify our nursing staff of your choice of new pharmacy.  Recommendations:  Keep all of your pain medications in a safe place, under lock and key, even if you live alone.   After you fill your prescription, take 1 week's worth of pills and put them away in a safe place. You should keep a separate, properly labeled bottle for this purpose. The remainder should be kept in the original bottle. Use this as your primary supply, until it runs out. Once it's gone, then you know that you have 1 week's worth of medicine, and it is time to come in for a prescription refill. If you do this correctly, it is unlikely that you will ever run out of medicine.  To make sure that the above recommendation works, it is very important that you make sure your medication refill appointments are scheduled at least 1 week before you run out of medicine. To do this in an effective manner, make sure that you do not leave the office without scheduling your next medication management appointment. Always ask the nursing staff to show you in your prescription , when your medication will be running out. Then arrange for the receptionist to get you a return appointment, at least 7 days before you run out of medicine. Do not wait until you have 1  or 2 pills left, to come in. This is very poor planning and does not take into consideration that we may need to cancel appointments due to bad weather, sickness, or emergencies affecting our staff.  "Partial Fill": If for any reason your pharmacy does not have enough pills/tablets to completely fill or refill your prescription, do not allow for a "partial fill". You will need a separate prescription to fill the remaining amount, which we will not provide. If the reason for the partial fill is your insurance, you will need to talk to the pharmacist about payment alternatives for the remaining tablets, but again, do not accept a partial fill.  Prescription refills and/or changes in medication(s):   Prescription refills, and/or changes in dose or medication, will be conducted only during scheduled medication management appointments. (Applies to both, written and electronic prescriptions.)  No refills on procedure days. No medication will be changed or started on procedure days. No changes, adjustments, and/or refills will be conducted on a procedure day. Doing so will interfere with the diagnostic portion of the procedure.  No phone refills. No medications will be "called into the pharmacy".  No Fax refills.  No weekend refills.  No Holliday refills.  No after hours refills.  Remember:  Business hours are:  Monday to Thursday 8:00 AM to 4:00 PM Provider's Schedule: Dionisio David, NP - Appointments are:  Medication management: Monday to Thursday 8:00 AM to 4:00 PM [name], MD - Appointments are:  Medication management: Monday and Wednesday 8:00 AM to 4:00 PM Procedure day: Tuesday and Thursday 7:30 AM to 4:00 PM Gillis Santa, MD - Appointments are:  Medication management: Tuesday and Thursday 8:00 AM to 4:00 PM Procedure  day: Monday and Wednesday 7:30 AM to 4:00 PM (Last update:  05/07/2017) ____________________________________________________________________________________________   ____________________________________________________________________________________________  CANNABIDIOL (AKA: CBD Oil or Pills)  Applies to: All patients receiving prescriptions of controlled substances (written and/or electronic).  General Information: Cannabidiol (CBD) was discovered in 51. It is one of some 113 identified cannabinoids in cannabis (Marijuana) plants, accounting for up to 40% of the plant's extract. As of 2018, preliminary clinical research on cannabidiol included studies of anxiety, cognition, movement disorders, and pain.  Cannabidiol is consummed in multiple ways, including inhalation of cannabis smoke or vapor, as an aerosol spray into the cheek, and by mouth. It may be supplied as CBD oil containing CBD as the active ingredient (no added tetrahydrocannabinol (THC) or terpenes), a full-plant CBD-dominant hemp extract oil, capsules, dried cannabis, or as a liquid solution. CBD is thought not have the same psychoactivity as THC, and may affect the actions of THC. Studies suggest that CBD may interact with different biological targets, including cannabinoid receptors and other neurotransmitter receptors. As of 2018 the mechanism of action for its biological effects has not been determined.  In the [place], cannabidiol has a limited approval by the Food and Drug Administration (FDA) for treatment of only two types of epilepsy disorders. The side effects of long-term use of the drug include somnolence, decreased appetite, diarrhea, fatigue, malaise, weakness, sleeping problems, and others.  CBD remains a Schedule I drug prohibited for any use.  Legality: Some manufacturers ship CBD products nationally, an illegal action which the FDA has not enforced in 2018, with CBD remaining the subject of an FDA investigational new drug evaluation, and is not considered legal as  a dietary supplement or food ingredient as of December 2018. Federal illegality has made it difficult historically to conduct research on CBD. CBD is openly sold in head shops and health food stores in some states where such sales have not been explicitly legalized.  Warning: Because it is not FDA approved for general use or treatment of pain, it is not required to undergo the same manufacturing controls as prescription drugs.  This means that the available cannabidiol (CBD) may be contaminated with THC.  If this is the case, it will trigger a positive urine drug screen (UDS) test for cannabinoids (Marijuana).  Because a positive UDS for illicit substances is a violation of our medication agreement, your opioid analgesics (pain medicine) may be permanently discontinued. (Last update: 05/28/2017) ____________________________________________________________________________________________   Pain Management Discharge Instructions  General Discharge Instructions :  If you need to reach your doctor call: Monday-Friday 8:00 am - 4:00 pm at [phone] or toll free [phone].  After clinic hours [phone] to have operator reach doctor.  Bring all of your medication bottles to all your appointments in the pain clinic.  To cancel or reschedule your appointment with Pain Management please remember to call 24 hours in advance to avoid a fee.  Refer to the educational materials which you have been given on: General Risks, I had my Procedure. Discharge Instructions, Post Sedation.  Post Procedure Instructions:  The drugs you were given will stay in your system until tomorrow, so for the next 24 hours you should not drive, make any legal decisions or drink any alcoholic beverages.  You may eat anything you prefer, but it is better to start with liquids then soups and crackers, and gradually work up to solid foods.  Please notify your doctor immediately if you have any unusual bleeding, trouble  breathing or pain that is  not related to your normal pain.  Depending on the type of procedure that was done, some parts of your body may feel week and/or numb.  This usually clears up by tonight or the next day.  Walk with the use of an assistive device or accompanied by an adult for the 24 hours.  You may use ice on the affected area for the first 24 hours.  Put ice in a Ziploc bag and cover with a towel and place against area 15 minutes on 15 minutes off.  You may switch to heat after 24 hours.GENERAL RISKS AND COMPLICATIONS  What are the risk, side effects and possible complications? Generally speaking, most procedures are safe.  However, with any procedure there are risks, side effects, and the possibility of complications.  The risks and complications are dependent upon the sites that are lesioned, or the type of nerve block to be performed.  The closer the procedure is to the spine, the more serious the risks are.  Great care is taken when placing the radio frequency needles, block needles or lesioning probes, but sometimes complications can occur. 1. Infection: Any time there is an injection through the skin, there is a risk of infection.  This is why sterile conditions are used for these blocks.  There are four possible types of infection. 1. Localized skin infection. 2. Central Nervous System Infection-This can be in the form of Meningitis, which can be deadly. 3. Epidural Infections-This can be in the form of an epidural abscess, which can cause pressure inside of the spine, causing compression of the spinal cord with subsequent paralysis. This would require an emergency surgery to decompress, and there are no guarantees that the patient would recover from the paralysis. 4. Discitis-This is an infection of the intervertebral discs.  It occurs in about 1% of discography procedures.  It is difficult to treat and it may lead to surgery.        2. Pain: the needles have to go through skin and  soft tissues, will cause soreness.       3. Damage to internal structures:  The nerves to be lesioned may be near blood vessels or    other nerves which can be potentially damaged.       4. Bleeding: Bleeding is more common if the patient is taking blood thinners such as  aspirin, Coumadin, Ticiid, Plavix, etc., or if he/she have some genetic predisposition  such as hemophilia. Bleeding into the spinal canal can cause compression of the spinal  cord with subsequent paralysis.  This would require an emergency surgery to  decompress and there are no guarantees that the patient would recover from the  paralysis.       5. Pneumothorax:  Puncturing of a lung is a possibility, every time a needle is introduced in  the area of the chest or upper back.  Pneumothorax refers to free air around the  collapsed lung(s), inside of the thoracic cavity (chest cavity).  Another two possible  complications related to a similar event would include: Hemothorax and Chylothorax.   These are variations of the Pneumothorax, where instead of air around the collapsed  lung(s), you may have blood or chyle, respectively.       6. Spinal headaches: They may occur with any procedures in the area of the spine.       7. Persistent CSF (Cerebro-Spinal Fluid) leakage: This is a rare problem, but may occur  with prolonged intrathecal or epidural catheters  either due to the formation of a fistulous  track or a dural tear.       8. Nerve damage: By working so close to the spinal cord, there is always a possibility of  nerve damage, which could be as serious as a permanent spinal cord injury with  paralysis.       9. Death:  Although rare, severe deadly allergic reactions known as "Anaphylactic  reaction" can occur to any of the medications used.      10. Worsening of the symptoms:  We can always make thing worse.  What are the chances of something like this happening? Chances of any of this occuring are extremely low.  By statistics, you  have more of a chance of getting killed in a motor vehicle accident: while driving to the hospital than any of the above occurring .  Nevertheless, you should be aware that they are possibilities.  In general, it is similar to taking a shower.  Everybody knows that you can slip, hit your head and get killed.  Does that mean that you should not shower again?  Nevertheless always keep in mind that statistics do not mean anything if you happen to be on the wrong side of them.  Even if a procedure has a 1 (one) in a 1,000,000 (million) chance of going wrong, it you happen to be that one..Also, keep in mind that by statistics, you have more of a chance of having something go wrong when taking medications.  Who should not have this procedure? If you are on a blood thinning medication (e.g. Coumadin, Plavix, see list of "Blood Thinners"), or if you have an active infection going on, you should not have the procedure.  If you are taking any blood thinners, please inform your physician.  How should I prepare for this procedure?  Do not eat or drink anything at least six hours prior to the procedure.  Bring a driver with you .  It cannot be a taxi.  Come accompanied by an adult that can drive you back, and that is strong enough to help you if your legs get weak or numb from the local anesthetic.  Take all of your medicines the morning of the procedure with just enough water to swallow them.  If you have diabetes, make sure that you are scheduled to have your procedure done first thing in the morning, whenever possible.  If you have diabetes, take only half of your insulin dose and notify our nurse that you have done so as soon as you arrive at the clinic.  If you are diabetic, but only take blood sugar pills (oral hypoglycemic), then do not take them on the morning of your procedure.  You may take them after you have had the procedure.  Do not take aspirin or any aspirin-containing medications, at least  eleven (11) days prior to the procedure.  They may prolong bleeding.  Wear loose fitting clothing that may be easy to take off and that you would not mind if it got stained with Betadine or blood.  Do not wear any jewelry or perfume  Remove any nail coloring.  It will interfere with some of our monitoring equipment.  NOTE: Remember that this is not meant to be interpreted as a complete list of all possible complications.  Unforeseen problems may occur.  BLOOD THINNERS The following drugs contain aspirin or other products, which can cause increased bleeding during surgery and should not be taken for  2 weeks prior to and 1 week after surgery.  If you should need take something for relief of minor pain, you may take acetaminophen which is found in Tylenol,m Datril, Anacin-3 and Panadol. It is not blood thinner. The products listed below are.  Do not take any of the products listed below in addition to any listed on your instruction sheet.  A.P.C or A.P.C with Codeine Codeine Phosphate Capsules #3 Ibuprofen Ridaura  ABC compound Congesprin Imuran rimadil  Advil Cope Indocin Robaxisal  Alka-Seltzer Effervescent Pain Reliever and Antacid Coricidin or Coricidin-D  Indomethacin Rufen  Alka-Seltzer plus Cold Medicine Cosprin Ketoprofen S-A-C Tablets  Anacin Analgesic Tablets or Capsules Coumadin Korlgesic Salflex  Anacin Extra Strength Analgesic tablets or capsules CP-2 Tablets Lanoril Salicylate  Anaprox Cuprimine Capsules Levenox Salocol  Anexsia-D Dalteparin Magan Salsalate  Anodynos Darvon compound Magnesium Salicylate Sine-off  Ansaid Dasin Capsules Magsal Sodium Salicylate  Anturane Depen Capsules Marnal Soma  APF Arthritis pain formula Dewitt's Pills Measurin Stanback  Argesic Dia-Gesic Meclofenamic Sulfinpyrazone  Arthritis Bayer Timed Release Aspirin Diclofenac Meclomen Sulindac  Arthritis pain formula Anacin Dicumarol Medipren Supac  Analgesic (Safety coated) Arthralgen Diffunasal  Mefanamic Suprofen  Arthritis Strength Bufferin Dihydrocodeine Mepro Compound Suprol  Arthropan liquid Dopirydamole Methcarbomol with Aspirin Synalgos  ASA tablets/Enseals Disalcid Micrainin Tagament  Ascriptin Doan's Midol Talwin  Ascriptin A/D Dolene Mobidin Tanderil  Ascriptin Extra Strength Dolobid Moblgesic Ticlid  Ascriptin with Codeine Doloprin or Doloprin with Codeine Momentum Tolectin  Asperbuf Duoprin Mono-gesic Trendar  Aspergum Duradyne Motrin or Motrin IB Triminicin  Aspirin plain, buffered or enteric coated Durasal Myochrisine Trigesic  Aspirin Suppositories Easprin Nalfon Trillsate  Aspirin with Codeine Ecotrin Regular or Extra Strength Naprosyn Uracel  Atromid-S Efficin Naproxen Ursinus  Auranofin Capsules Elmiron Neocylate Vanquish  Axotal Emagrin Norgesic Verin  Azathioprine Empirin or Empirin with Codeine Normiflo Vitamin E  Azolid Emprazil Nuprin Voltaren  Bayer Aspirin plain, buffered or children's or timed BC Tablets or powders Encaprin Orgaran Warfarin Sodium  Buff-a-Comp Enoxaparin Orudis Zorpin  Buff-a-Comp with Codeine Equegesic Os-Cal-Gesic   Buffaprin Excedrin plain, buffered or Extra Strength Oxalid   Bufferin Arthritis Strength Feldene Oxphenbutazone   Bufferin plain or Extra Strength Feldene Capsules Oxycodone with Aspirin   Bufferin with Codeine Fenoprofen Fenoprofen Pabalate or Pabalate-SF   Buffets II Flogesic Panagesic   Buffinol plain or Extra Strength Florinal or Florinal with Codeine Panwarfarin   Buf-Tabs Flurbiprofen Penicillamine   Butalbital Compound Four-way cold tablets Penicillin   Butazolidin Fragmin Pepto-Bismol   Carbenicillin Geminisyn Percodan   Carna Arthritis Reliever Geopen Persantine   Carprofen Gold's salt Persistin   Chloramphenicol Goody's Phenylbutazone   Chloromycetin Haltrain Piroxlcam   Clmetidine heparin Plaquenil   Cllnoril Hyco-pap Ponstel   Clofibrate Hydroxy chloroquine Propoxyphen         Before stopping any of  these medications, be sure to consult the physician who ordered them.  Some, such as Coumadin (Warfarin) are ordered to prevent or treat serious conditions such as "deep thrombosis", "pumonary embolisms", and other heart problems.  The amount of time that you may need off of the medication may also vary with the medication and the reason for which you were taking it.  If you are taking any of these medications, please make sure you notify your pain physician before you undergo any procedures.         Selective Nerve Root Block Patient Information  Description: Specific nerve roots exit the spinal canal and these nerves can be compressed and inflamed by  a bulging disc and bone spurs.  By injecting steroids on the nerve root, we can potentially decrease the inflammation surrounding these nerves, which often leads to decreased pain.  Also, by injecting local anesthesia on the nerve root, this can provide [place] helpful information to give to your referring doctor if it decreases your pain.  Selective nerve root blocks can be done along the spine from the neck to the low back depending on the location of your pain.   After numbing the skin with local anesthesia, a small needle is passed to the nerve root and the position of the needle is verified using x-ray pictures.  After the needle is in correct position, we then deposit the medication.  You may experience a pressure sensation while this is being done.  The entire block usually lasts less than 15 minutes.  Conditions that may be treated with selective nerve root blocks:  Low back and leg pain  Spinal stenosis  Diagnostic block prior to potential surgery  Neck and arm pain  Post laminectomy syndrome  Preparation for the injection:  1. Do not eat any solid food or dairy products within 8 hours of your appointment. 2. You may drink clear liquids up to 3 hours before an appointment.  Clear liquids include water, black coffee, juice or soda.  No  milk or cream please. 3. You may take your regular medications, including pain medications, with a sip of water before your appointment.  Diabetics should hold regular insulin (if taken separately) and take 1/2 normal NPH dose the morning of the procedure.  Carry some sugar containing items with you to your appointment. 4. A driver must accompany you and be prepared to drive you home after your procedure. 5. Bring all your current medications with you. 6. An IV may be inserted and sedation may be given at the discretion of the physician. 7. A blood pressure cuff, EKG, and other monitors will often be applied during the procedure.  Some patients may need to have extra oxygen administered for a short period. 8. You will be asked to provide medical information, including allergies, prior to the procedure.  We must know immediately if you are taking blood  Thinners (like Coumadin) or if you are allergic to IV iodine contrast (dye).  Possible side-effects: All are usually temporary  Bleeding from needle site  Light headedness  Numbness and tingling  Decreased blood pressure  Weakness in arms/legs  Pressure sensation in back/neck  Pain at injection site (several days)  Possible complications: All are extremely rare  Infection  Nerve injury  Spinal headache (a headache wore with upright position)  Call if you experience:  Fever/chills associated with headache or increased back/neck pain  Headache worsened by an upright position  New onset weakness or numbness of an extremity below the injection site  Hives or difficulty breathing (go to the emergency room)  Inflammation or drainage at the injection site(s)  Severe back/neck pain greater than usual  New symptoms which are concerning to you  Please note:  Although the local anesthetic injected can often make your back or neck feel good for several hours after the injection the pain will likely return.  It takes 3-5 days  for steroids to work on the nerve root. You may not notice any pain relief for at least one week.  If effective, we will often do a series of 3 injections spaced 3-6 weeks apart to maximally decrease your pain.    If you  have any questions, please call [phone] [facility] ALL OF YOUR MEDICATIONS PRESCRIBED TODAY WERE E-SCRIBED TO YOUR PHARMACY OF CHOICE.

## 2018-04-05 NOTE — Progress Notes (Signed)
Safety precautions to be maintained throughout the outpatient stay will include: orient to surroundings, keep bed in low position, maintain call bell within reach at all times, provide assistance with transfer out of bed and ambulation.  

## 2018-04-06 ENCOUNTER — Telehealth: Payer: Self-pay | Admitting: Pain Medicine

## 2018-04-06 MED ORDER — OXYCODONE-ACETAMINOPHEN 2.5-325 MG PO TABS: 1.0000 | ORAL_TABLET | Freq: Four times a day (QID) | ORAL | 0 refills | Status: DC | PRN

## 2018-04-06 NOTE — Telephone Encounter (Signed)
I called [facility] and verified that they are out of stock of Oxy/Acet. I cancelled the prescription. I changed patient's preferred pharmacy to the [facility] requested.  Please re send the script to the new pharmacy.

## 2018-04-06 NOTE — Telephone Encounter (Signed)
Pt called and left a voicemail stating he wants his medication sent to [facility] in [place] on [address]. Pt states that [facility] is out of stock.

## 2018-04-06 NOTE — Telephone Encounter (Signed)
Can someone ask Dr Lowella Dandy this. Thanks

## 2018-04-07 ENCOUNTER — Telehealth: Payer: Self-pay | Admitting: Pain Medicine

## 2018-04-07 NOTE — Telephone Encounter (Signed)
Pt called and stated that he can't afford his prescription. He says it is $258. He says he will need something else

## 2018-04-07 NOTE — Telephone Encounter (Signed)
Spoke with pharmacist, the script costs [phone] because today they only have 10 pills in stock. They will have enough to fill the complete prescription tomorrow, and it should not cost that much. I attempted to call patient, unable to leave a message.

## 2018-04-07 NOTE — Telephone Encounter (Signed)
Attempted to call again, unable to leave a message.

## 2018-04-08 NOTE — Telephone Encounter (Signed)
Patient notified of conversation with pharmacy.

## 2018-04-12 ENCOUNTER — Inpatient Hospital Stay: Payer: Medicare Other | Attending: Oncology

## 2018-04-12 DIAGNOSIS — Z7982 Long term (current) use of aspirin: Secondary | ICD-10-CM | POA: Diagnosis not present

## 2018-04-12 DIAGNOSIS — Z191 Hormone sensitive malignancy status: Secondary | ICD-10-CM | POA: Diagnosis not present

## 2018-04-12 DIAGNOSIS — Z923 Personal history of irradiation: Secondary | ICD-10-CM | POA: Insufficient documentation

## 2018-04-12 DIAGNOSIS — Z79899 Other long term (current) drug therapy: Secondary | ICD-10-CM | POA: Insufficient documentation

## 2018-04-12 DIAGNOSIS — C61 Malignant neoplasm of prostate: Secondary | ICD-10-CM | POA: Diagnosis not present

## 2018-04-12 DIAGNOSIS — C7951 Secondary malignant neoplasm of bone: Secondary | ICD-10-CM | POA: Diagnosis not present

## 2018-04-12 DIAGNOSIS — M549 Dorsalgia, unspecified: Secondary | ICD-10-CM | POA: Diagnosis not present

## 2018-04-12 LAB — CBC WITH DIFFERENTIAL (CANCER CENTER ONLY)
Abs Immature Granulocytes: 0 10*3/uL (ref 0.00–0.07)
Basophils Absolute: 0 10*3/uL (ref 0.0–0.1)
Basophils Relative: 1 %
Eosinophils Absolute: 0.3 10*3/uL (ref 0.0–0.5)
Eosinophils Relative: 6 %
HCT: 36.9 % — ABNORMAL LOW (ref 39.0–52.0)
Hemoglobin: 12.3 g/dL — ABNORMAL LOW (ref 13.0–17.0)
Immature Granulocytes: 0 %
Lymphocytes Relative: 41 %
Lymphs Abs: 1.8 10*3/uL (ref 0.7–4.0)
MCH: 32.7 pg (ref 26.0–34.0)
MCHC: 33.3 g/dL (ref 30.0–36.0)
MCV: 98.1 fL (ref 80.0–100.0)
Monocytes Absolute: 0.5 10*3/uL (ref 0.1–1.0)
Monocytes Relative: 13 %
Neutro Abs: 1.7 10*3/uL (ref 1.7–7.7)
Neutrophils Relative %: 39 %
Platelet Count: 148 10*3/uL — ABNORMAL LOW (ref 150–400)
RBC: 3.76 MIL/uL — ABNORMAL LOW (ref 4.22–5.81)
RDW: 13.2 % (ref 11.5–15.5)
WBC Count: 4.2 10*3/uL (ref 4.0–10.5)
nRBC: 0 % (ref 0.0–0.2)

## 2018-04-12 LAB — CMP (CANCER CENTER ONLY)
ALK PHOS: 48 U/L (ref 38–126)
ALT: 15 U/L (ref 0–44)
AST: 19 U/L (ref 15–41)
Albumin: 3.7 g/dL (ref 3.5–5.0)
Anion gap: 6 (ref 5–15)
BUN: 21 mg/dL (ref 8–23)
CO2: 27 mmol/L (ref 22–32)
CREATININE: 1.55 mg/dL — AB (ref 0.61–1.24)
Calcium: 8.9 mg/dL (ref 8.9–10.3)
Chloride: 109 mmol/L (ref 98–111)
GFR, Est AFR Am: 47 mL/min — ABNORMAL LOW (ref >60)
GFR, Estimated: 41 mL/min — ABNORMAL LOW (ref >60)
Glucose, Bld: 134 mg/dL — ABNORMAL HIGH (ref 70–99)
Potassium: 4.6 mmol/L (ref 3.5–5.1)
Sodium: 142 mmol/L (ref 135–145)
Total Bilirubin: 0.6 mg/dL (ref 0.3–1.2)
Total Protein: 6.5 g/dL (ref 6.5–8.1)

## 2018-04-13 ENCOUNTER — Telehealth: Payer: Self-pay | Admitting: Oncology

## 2018-04-13 ENCOUNTER — Other Ambulatory Visit: Payer: Medicare Other

## 2018-04-13 ENCOUNTER — Inpatient Hospital Stay (HOSPITAL_BASED_OUTPATIENT_CLINIC_OR_DEPARTMENT_OTHER): Payer: Medicare Other | Admitting: Oncology

## 2018-04-13 ENCOUNTER — Inpatient Hospital Stay: Payer: Medicare Other

## 2018-04-13 VITALS — BP 153/85 | HR 66 | Temp 97.9°F | Resp 17 | Ht 67.0 in | Wt 181.7 lb

## 2018-04-13 DIAGNOSIS — C7951 Secondary malignant neoplasm of bone: Secondary | ICD-10-CM | POA: Diagnosis not present

## 2018-04-13 DIAGNOSIS — Z79899 Other long term (current) drug therapy: Secondary | ICD-10-CM | POA: Diagnosis not present

## 2018-04-13 DIAGNOSIS — Z191 Hormone sensitive malignancy status: Secondary | ICD-10-CM

## 2018-04-13 DIAGNOSIS — S32020S Wedge compression fracture of second lumbar vertebra, sequela: Secondary | ICD-10-CM

## 2018-04-13 DIAGNOSIS — C61 Malignant neoplasm of prostate: Secondary | ICD-10-CM

## 2018-04-13 DIAGNOSIS — Z923 Personal history of irradiation: Secondary | ICD-10-CM

## 2018-04-13 DIAGNOSIS — S32020D Wedge compression fracture of second lumbar vertebra, subsequent encounter for fracture with routine healing: Secondary | ICD-10-CM

## 2018-04-13 DIAGNOSIS — Z7982 Long term (current) use of aspirin: Secondary | ICD-10-CM | POA: Diagnosis not present

## 2018-04-13 DIAGNOSIS — M549 Dorsalgia, unspecified: Secondary | ICD-10-CM | POA: Diagnosis not present

## 2018-04-13 LAB — PROSTATE-SPECIFIC AG, SERUM (LABCORP): Prostate Specific Ag, Serum: <0.1 ng/mL (ref 0.0–4.0)

## 2018-04-13 MED ORDER — DENOSUMAB 120 MG/1.7ML ~~LOC~~ SOLN
120.0000 mg | Freq: Once | SUBCUTANEOUS | Status: AC
  Administered 2018-04-13: 120 mg via SUBCUTANEOUS

## 2018-04-13 MED ORDER — DENOSUMAB 120 MG/1.7ML ~~LOC~~ SOLN
SUBCUTANEOUS | Status: AC
  Filled 2018-04-13: qty 1.7

## 2018-04-13 NOTE — Progress Notes (Signed)
Hematology and Oncology Follow Up Visit  Logan Brown [id number] 1931/06/17 85 y.o. 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  He was diagnosed in the year 2000 and developed a recurrent disease in 2019.   Prior Therapy: He is status post  radical prostatectomy followed by external beam radiation and remains disease free after diagnosis in 2000.  He presented with a fall and found to have PSA of 245 and multiple osseous lesions indicating metastatic disease to the bone in May 2019.  He was started on Lupron and Xgeva at that time.    Current therapy:  Androgen deprivation therapy under the care of Dr. Lovena Neighbours at [facility].  Xgeva 120 mg every month started in January of 2020.  Interim History: [name] presents today for a follow-up visit.  Since the last visit, he reports no major changes in his health.  He continues to tolerate androgen deprivation therapy under the care of Dr. Lovena Neighbours without any complaints.  He received Xgeva in January 2020 without any issues.  His performance status and quality of life remains unchanged.  He has established care with pain management specialist.  He is currently on Percocet and fentanyl.  Patient denied any alteration mental status, neuropathy, confusion or dizziness.  Denies any headaches or lethargy.  Denies any night sweats, weight loss or changes in appetite.  Denied orthopnea, dyspnea on exertion or chest discomfort.  Denies shortness of breath, difficulty breathing hemoptysis or cough.  Denies any abdominal distention, nausea, early satiety or dyspepsia.  Denies any hematuria, frequency, dysuria or nocturia.  Denies any skin irritation, dryness or rash.  Denies any ecchymosis or petechiae.  Denies any lymphadenopathy or clotting.  Denies any heat or cold intolerance.  Denies any anxiety or depression.   Remaining review of system is negative.        Medications: I have reviewed the patient's current medications.  Current Outpatient Medications  Medication Sig Dispense Refill  . ALPRAZolam (XANAX) 0.5 MG tablet Take 0.25 mg by mouth 2 (two) times daily as needed for anxiety.    [name] Kitchen aspirin EC 81 MG tablet Take 81 mg by mouth daily.    . Cholecalciferol (VITAMIN D3) 25 MCG (1000 UT) CAPS Vitamin D3 1,000 unit capsule  Take by oral route.    . Cyanocobalamin (VITAMIN B-12) 5000 MCG SUBL Place 1 tablet (5,000 mcg total) under the tongue daily for 30 days. 30 each 0  . gabapentin (NEURONTIN) 600 MG tablet Take 1/2 to 1 tablet 2 to 3 x /day for chronic pain 90 tablet 2  . hydrOXYzine (ATARAX/VISTARIL) 25 MG tablet Take 1 tablet (25 mg total) by mouth every 6 (six) hours as needed for itching. 30 tablet 1  . labetalol (NORMODYNE) 100 MG tablet Take 100 mg by mouth 2 (two) times daily. Take 50mg  in the morning and 50mg  in the evening    . lisinopril (PRINIVIL,ZESTRIL) 10 MG tablet Take 1 tablet (10 mg total) by mouth daily. 90 tablet 1  . Multiple Vitamins-Minerals (CENTRUM SILVER ADULT 50+ PO) Take by mouth.    [name] Kitchen omeprazole (PRILOSEC) 20 MG capsule Take 1 capsule (20 mg total) by mouth daily. 90 capsule 1  . oxycodone-acetaminophen (PERCOCET) 2.5-325 MG tablet Take 1 tablet by mouth every 6 (six) hours as needed for up to 30 days for pain. Must last 30 days 120 tablet 0   No current facility-administered medications  for this visit.      Allergies:  Allergies  Allergen Reactions  . Ace Inhibitors Swelling    angioedema  . Hydrocodone Nausea And Vomiting    Past Medical History, Surgical history, Social history, and Family History were reviewed and updated.    Physical Exam: Blood pressure (!) 153/85, pulse 66, temperature 97.9 F (36.6 C), temperature source Oral, resp. rate 17, height 5\' 7"  (1.702 m), weight 181 lb 11.2 oz (82.4 kg), SpO2 99 %.   ECOG: 2    General  appearance: Comfortable appearing without any discomfort Head: Normocephalic without any trauma Oropharynx: Mucous membranes are moist and pink without any thrush or ulcers. Eyes: Pupils are equal and round reactive to light. Lymph nodes: No cervical, supraclavicular, inguinal or axillary lymphadenopathy.   Heart:regular rate and rhythm.  S1 and S2 without leg edema. Lung: Clear without any rhonchi or wheezes.  No dullness to percussion. Abdomin: Soft, nontender, nondistended with good bowel sounds.  No hepatosplenomegaly. Musculoskeletal: No joint deformity or effusion.  Full range of motion noted. Neurological: No deficits noted on motor, sensory and deep tendon reflex exam. Skin: No petechial rash or dryness.  Appeared moist.  Psychiatric: Mood and affect appeared appropriate.    Lab Results: Lab Results  Component Value Date   WBC 4.2 04/12/2018   HGB 12.3 (L) 04/12/2018   HCT 36.9 (L) 04/12/2018   MCV 98.1 04/12/2018   PLT 148 (L) 04/12/2018     Chemistry      Component Value Date/Time   NA 142 04/12/2018 0826   NA 143 03/23/2018 1109   K 4.6 04/12/2018 0826   CL 109 04/12/2018 0826   CO2 27 04/12/2018 0826   BUN 21 04/12/2018 0826   BUN 24 03/23/2018 1109   CREATININE 1.55 (H) 04/12/2018 0826   CREATININE 1.39 (H) 01/12/2018 1525      Component Value Date/Time   CALCIUM 8.9 04/12/2018 0826   ALKPHOS 48 04/12/2018 0826   AST 19 04/12/2018 0826   ALT 15 04/12/2018 0826   BILITOT 0.6 04/12/2018 0826      Results for KASHUS, KARLEN (MRN [id number]) as of 04/13/2018 14:09  Ref. Range 03/12/2018 12:35 04/12/2018 08:26  Prostate Specific Ag, Serum Latest Ref Range: 0.0 - 4.0 ng/mL <0.1 <0.1      Impression and Plan:   85 year old with the following:   1.    Advanced prostate cancer with disease to the bone that is currently castration-sensitive documented in 2019.    The natural course of this disease and treatment options were reviewed again with the patient  and his family.  He understands he has an incurable malignancy and additional treatment may be needed in the future.  These would be in the form of Zytiga, Xtandi or systemic chemotherapy.  I have opted to defer these options to a later date.  His PSA remains undetectable which indicates rather reasonable disease control.   2.  Androgen deprivation: I recommended continuing this under the care of Dr. Lovena Neighbours indefinitely.  3.  Bone directed therapy: He has started Xgeva without any complications.  Long-term issues including hypocalcemia and osteonecrosis of the jaw is reiterated.  He remains on calcium and vitamin D supplements.  4.  Prognosis and goals of care: Therapy remains palliative and his disease is incurable.  His performance status is reasonable however and aggressive therapy is warranted.  5.  Back pain: Managed pain clinic at this time.  He is using Neurontin, Percocet and fentanyl.  6.  Follow-up: On a monthly basis for Xgeva with repeat MD evaluation in 4 months.  25  minutes was spent with the patient face-to-face today.  More than 50% of time was spent on reviewing his disease status, treatment options, complications related therapy and answering questions regarding future plan of care.     Zola Button, MD 2/4/20203:11 PM

## 2018-04-13 NOTE — Patient Instructions (Signed)

## 2018-04-13 NOTE — Telephone Encounter (Signed)
Gave patient avs report and appointments for March thru June

## 2018-04-15 ENCOUNTER — Other Ambulatory Visit: Payer: Medicare Other | Admitting: Primary Care

## 2018-04-15 DIAGNOSIS — C7951 Secondary malignant neoplasm of bone: Secondary | ICD-10-CM

## 2018-04-15 DIAGNOSIS — G893 Neoplasm related pain (acute) (chronic): Secondary | ICD-10-CM

## 2018-04-15 DIAGNOSIS — Z515 Encounter for palliative care: Secondary | ICD-10-CM | POA: Diagnosis not present

## 2018-04-15 DIAGNOSIS — G894 Chronic pain syndrome: Secondary | ICD-10-CM | POA: Diagnosis not present

## 2018-04-15 DIAGNOSIS — F325 Major depressive disorder, single episode, in full remission: Secondary | ICD-10-CM

## 2018-04-15 NOTE — Progress Notes (Signed)
Community Palliative Care Telephone: [phone] Fax: [phone]  PATIENT NAME: Logan Brown DOB: February 24, 1932 MRN: [id number]  PRIMARY CARE PROVIDER:   [name], MD  REFERRING PROVIDER:  [name], [address] [place], [place] [address]  RESPONSIBLE PARTY:   Extended Emergency Contact Information Primary Emergency Contact: [name] [place] Mobile Phone: [phone] Relation: Friend  Community palliative care follows patient by consultation request from Dr. Melford Aase.  ASSESSMENT and RECOMMENDATIONS:   1. Pain management: Has seen pain management at [facility] and has oxycodone / acetaminophen 2.5 mg/ 325. Still also taking 12 mcg fentanyl, a 20 mg/ 24 hour oxycodone equivalent. Note sent to pain management to ascertain what patient should do when fentanyl runs out.   2. Sleep: Still not sleeping well, had recommended melatonin. This should go through pain management now.  3. Depression: Had recommended cymbalta 30 mg but anything to address depression and pain is reasonable. Pt states he has not been back to PCP, encouraged to make f/u appt.  4. Edema: Recommend f/u with cardiology, has not seen in 7 mos. Increased wts, edema and BP. Recommend Lasix 20 mg daily or when increased wt of 2 lb/day or 5 /week prn.   5. Safety:  Has cane but not using, encouraged to use cane for stability. Has ataxia due to pain and debility. Also needs tub bench to minimize risk of bathroom falls.   Palliative to continue to follow for symptom management, goals of care clarification. Return 4 weeks.   I spent 60 minutes providing this consultation,  from 1015  to 1115. More than 50% of the time in this consultation was spent coordinating communication.   HISTORY OF PRESENT ILLNESS:  Logan Brown is a 85 y.o. year old male with multiple medical problems including OA, prostate cancer, bone pain, afib. Palliative Care was asked to help address  goals of care.   CODE STATUS: FULL  PPS: 50% HOSPICE ELIGIBILITY/DIAGNOSIS: TBD  PAST MEDICAL HISTORY:  Past Medical History:  Diagnosis Date  . Arthritis   . Atrial fibrillation ([place])   . Bone cancer ([place])   . Cardiomyopathy ([place])   . Hyperlipidemia   . Irregular heartbeat   . Prostate CA ([place])    1997  . Prostate cancer [facility])     SOCIAL HX:  Social History   Tobacco Use  . Smoking status: Former Smoker    Last attempt to quit: 03/14/1975    Years since quitting: 43.1  . Smokeless tobacco: Never Used  Substance Use Topics  . Alcohol use: No    Alcohol/week: 0.0 standard drinks    Comment: occ    ALLERGIES:  Allergies  Allergen Reactions  . Ace Inhibitors Swelling    angioedema  . Hydrocodone Nausea And Vomiting     PERTINENT MEDICATIONS:  Outpatient Encounter Medications as of 04/15/2018  Medication Sig  . ALPRAZolam (XANAX) 0.5 MG tablet Take 0.25 mg by mouth 2 (two) times daily as needed for anxiety.  [name] Kitchen aspirin EC 81 MG tablet Take 81 mg by mouth daily.  . Cholecalciferol (VITAMIN D3) 25 MCG (1000 UT) CAPS Vitamin D3 1,000 unit capsule  Take by oral route.  . Cyanocobalamin (VITAMIN B-12) 5000 MCG SUBL Place 1 tablet (5,000 mcg total) under the tongue daily for 30 days.  [name] Kitchen gabapentin (NEURONTIN) 600 MG tablet Take 1/2 to 1 tablet 2 to 3 x /day for chronic pain  . hydrOXYzine (ATARAX/VISTARIL) 25 MG tablet Take 1 tablet (25 mg  total) by mouth every 6 (six) hours as needed for itching.  . labetalol (NORMODYNE) 100 MG tablet Take 100 mg by mouth 2 (two) times daily. Take 50mg  in the morning and 50mg  in the evening  . lisinopril (PRINIVIL,ZESTRIL) 10 MG tablet Take 1 tablet (10 mg total) by mouth daily.  . Multiple Vitamins-Minerals (CENTRUM SILVER ADULT 50+ PO) Take by mouth.  [name] Kitchen omeprazole (PRILOSEC) 20 MG capsule Take 1 capsule (20 mg total) by mouth daily.  [name] Kitchen oxycodone-acetaminophen (PERCOCET) 2.5-325 MG tablet Take 1 tablet by mouth every 6 (six) hours as needed  for up to 30 days for pain. Must last 30 days   No facility-administered encounter medications on file as of 04/15/2018.     PHYSICAL EXAM:    VS Wt. 188 lb. 31 arm circ. 97.8- 61-18 161/72 99%  General: NAD, frail appearing, thin Cardiovascular: regular rate and rhythm, S1S2 Pulmonary: clear all fields, no cough Abdomen: soft, nontender, + bowel sounds, recommended miralax. Extremities: 2+ bil LE edema, no joint deformities, does not take a diuretic.  Skin: no rashes, no wounds Neurological: Weakness but otherwise nonfocal, no falls, sleep poor but improved with pain management.   Cyndia Skeeters DNP, AGPCNP-BC

## 2018-04-16 ENCOUNTER — Encounter: Payer: Self-pay | Admitting: Internal Medicine

## 2018-04-16 ENCOUNTER — Telehealth: Payer: Self-pay | Admitting: Internal Medicine

## 2018-04-16 DIAGNOSIS — R2681 Unsteadiness on feet: Secondary | ICD-10-CM

## 2018-04-16 HISTORY — DX: Unsteadiness on feet: R26.81

## 2018-04-16 NOTE — Telephone Encounter (Signed)
Ralene Bathe, RN w/ Palliative Care called to recommend Tub Transfer Bed padded for patient. Patient is at risk for falls in bathroom. Patient does not have a sturdy hand rail in bathroom to access tub,  can not access tub safely without assistance of transfer bench.  Faxed order to [facility] for delivery of DME Tub transfer bench padded (if insurance will pay otherwise Deliver not padded per Dr [name]

## 2018-04-20 ENCOUNTER — Ambulatory Visit (HOSPITAL_BASED_OUTPATIENT_CLINIC_OR_DEPARTMENT_OTHER): Payer: Medicare Other | Admitting: Pain Medicine

## 2018-04-20 ENCOUNTER — Other Ambulatory Visit: Payer: Self-pay

## 2018-04-20 ENCOUNTER — Ambulatory Visit: Payer: Self-pay | Admitting: Adult Health Nurse Practitioner

## 2018-04-20 ENCOUNTER — Ambulatory Visit
Admission: RE | Admit: 2018-04-20 | Discharge: 2018-04-20 | Disposition: A | Discharge status: Home / Self Care | Payer: Medicare Other | Source: Ambulatory Visit | Attending: Pain Medicine | Admitting: Pain Medicine

## 2018-04-20 ENCOUNTER — Encounter: Payer: Self-pay | Admitting: Pain Medicine

## 2018-04-20 ENCOUNTER — Ambulatory Visit: Payer: Self-pay | Admitting: Internal Medicine

## 2018-04-20 VITALS — BP 162/81 | HR 63 | Temp 97.7°F | Resp 15 | Ht 67.0 in | Wt 181.0 lb

## 2018-04-20 DIAGNOSIS — M545 Low back pain, unspecified: Secondary | ICD-10-CM

## 2018-04-20 DIAGNOSIS — S32020S Wedge compression fracture of second lumbar vertebra, sequela: Secondary | ICD-10-CM

## 2018-04-20 DIAGNOSIS — G8929 Other chronic pain: Secondary | ICD-10-CM | POA: Diagnosis not present

## 2018-04-20 DIAGNOSIS — C7951 Secondary malignant neoplasm of bone: Secondary | ICD-10-CM

## 2018-04-20 DIAGNOSIS — M48061 Spinal stenosis, lumbar region without neurogenic claudication: Secondary | ICD-10-CM

## 2018-04-20 DIAGNOSIS — M5136 Other intervertebral disc degeneration, lumbar region: Secondary | ICD-10-CM

## 2018-04-20 DIAGNOSIS — G893 Neoplasm related pain (acute) (chronic): Secondary | ICD-10-CM

## 2018-04-20 MED ORDER — IOPAMIDOL (ISOVUE-M 200) INJECTION 41%
10.0000 mL | Freq: Once | INTRAMUSCULAR | Status: AC
  Administered 2018-04-20: 10 mL via EPIDURAL
  Filled 2018-04-20: qty 10

## 2018-04-20 MED ORDER — DEXAMETHASONE SODIUM PHOSPHATE 10 MG/ML IJ SOLN
INTRAMUSCULAR | Status: AC
  Filled 2018-04-20: qty 2

## 2018-04-20 MED ORDER — LIDOCAINE HCL 2 % IJ SOLN
20.0000 mL | Freq: Once | INTRAMUSCULAR | Status: AC
  Administered 2018-04-20: 400 mg

## 2018-04-20 MED ORDER — SODIUM CHLORIDE (PF) 0.9 % IJ SOLN
INTRAMUSCULAR | Status: AC
  Filled 2018-04-20: qty 10

## 2018-04-20 MED ORDER — ROPIVACAINE HCL 2 MG/ML IJ SOLN
INTRAMUSCULAR | Status: AC
  Filled 2018-04-20: qty 10

## 2018-04-20 MED ORDER — FENTANYL CITRATE (PF) 100 MCG/2ML IJ SOLN
INTRAMUSCULAR | Status: AC
  Filled 2018-04-20: qty 2

## 2018-04-20 MED ORDER — FENTANYL CITRATE (PF) 100 MCG/2ML IJ SOLN
25.0000 ug | INTRAMUSCULAR | Status: DC | PRN
  Administered 2018-04-20: 50 ug via INTRAVENOUS

## 2018-04-20 MED ORDER — SODIUM CHLORIDE 0.9% FLUSH: 1.0000 mL | Freq: Once | INTRAVENOUS | Status: DC

## 2018-04-20 MED ORDER — SODIUM CHLORIDE 0.9% FLUSH
1.0000 mL | Freq: Once | INTRAVENOUS | Status: AC
  Administered 2018-04-20: 2 mL

## 2018-04-20 MED ORDER — ROPIVACAINE HCL 2 MG/ML IJ SOLN
1.0000 mL | Freq: Once | INTRAMUSCULAR | Status: AC
  Administered 2018-04-20: 1 mL via EPIDURAL

## 2018-04-20 MED ORDER — MIDAZOLAM HCL 5 MG/5ML IJ SOLN
INTRAMUSCULAR | Status: AC
  Filled 2018-04-20: qty 5

## 2018-04-20 MED ORDER — LACTATED RINGERS IV SOLN
1000.0000 mL | Freq: Once | INTRAVENOUS | Status: AC
  Administered 2018-04-20: 1000 mL via INTRAVENOUS

## 2018-04-20 MED ORDER — DEXAMETHASONE SODIUM PHOSPHATE 10 MG/ML IJ SOLN
10.0000 mg | Freq: Once | INTRAMUSCULAR | Status: AC
  Administered 2018-04-20: 10 mg

## 2018-04-20 MED ORDER — LIDOCAINE HCL 2 % IJ SOLN
INTRAMUSCULAR | Status: AC
  Filled 2018-04-20: qty 20

## 2018-04-20 MED ORDER — MIDAZOLAM HCL 5 MG/5ML IJ SOLN
1.0000 mg | INTRAMUSCULAR | Status: DC | PRN
  Administered 2018-04-20: 1 mg via INTRAVENOUS

## 2018-04-20 NOTE — Patient Instructions (Signed)

## 2018-04-20 NOTE — Progress Notes (Signed)
Patient's Name: Logan Brown  MRN: [id number]  Referring Provider: [name], MD  DOB: Jul 19, 1931  PCP: [name], MD  DOS: 04/20/2018  Note by: Gaspar Cola, MD  Service setting: Ambulatory outpatient  Specialty: Interventional Pain Management  Patient type: Established  Location: [facility] (AMB) Pain Management Facility  Visit type: Interventional Procedure   Primary Reason for Visit: Interventional Pain Management Treatment. CC: Back Pain (low)  Procedure:          Anesthesia, Analgesia, Anxiolysis:  Type: Trans-Foraminal Epidural Steroid Injection #1  Purpose: Diagnostic/Therapeutic Region: Posterolateral Lumbosacral Target Area: The 6 o'clock position under the pedicle, on the affected side. Approach: Posterior Percutaneous Paravertebral approach. Level: L2 Level Laterality: Bilateral Paravertebral  Type: Moderate (Conscious) Sedation combined with Local Anesthesia Indication(s): Analgesia and Anxiety Route: Intravenous (IV) IV Access: Secured Sedation: Meaningful verbal contact was maintained at all times during the procedure  Local Anesthetic: Lidocaine 1-2%  Position: Prone   Indications: 1. DDD (degenerative disc disease), lumbar   2. 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 Lumbar central spinal stenosis, w/o neurogenic claudication   4. Compression fracture of L2 lumbar vertebra, sequela   5. Chronic low back pain (Primary Area of Pain) (Bilateral) (R>L) w/o sciatica   6. Metastatic cancer to spine (HCC)   7. Cancer-related pain    Pain Score: Pre-procedure: 4 /10 Post-procedure: 0-No pain/10  Pre-op Assessment:  Logan Brown is a 85 y.o. (year old), male patient, seen today for interventional treatment. He  has a past surgical history that includes Eye surgery (Bilateral); Cataract extraction; Prostate surgery; and Wrist foreign body removal. Logan Brown has a current medication list which includes the  following prescription(s): alprazolam, aspirin ec, vitamin d3, vitamin b-12, gabapentin, hydroxyzine, labetalol, lisinopril, multiple vitamins-minerals, omeprazole, and oxycodone-acetaminophen, and the following Facility-Administered Medications: fentanyl, midazolam, and sodium chloride flush. His primarily concern today is the Back Pain (low)  Initial Vital Signs:  Pulse/HCG Rate: 63ECG Heart Rate: (!) 58 Temp: (!) 97.4 F (36.3 C) Resp: 18 BP: (!) 172/96 SpO2: 99 %  BMI: Estimated body mass index is 28.35 kg/m as calculated from the following:   Height as of this encounter: 5\' 7"  (1.702 m).   Weight as of this encounter: 181 lb (82.1 kg).  Risk Assessment: Allergies: Reviewed. He is allergic to ace inhibitors and hydrocodone.  Allergy Precautions: None required Coagulopathies: Reviewed. None identified.  Blood-thinner therapy: None at this time Active Infection(s): Reviewed. None identified. Logan Brown is afebrile  Site Confirmation: Logan Brown was asked to confirm the procedure and laterality before marking the site Procedure checklist: Completed Consent: Before the procedure and under the influence of no sedative(s), amnesic(s), or anxiolytics, the patient was informed of the treatment options, risks and possible complications. To fulfill our ethical and legal obligations, as recommended by the American Medical Association's Code of Ethics, I have informed the patient of my clinical impression; the nature and purpose of the treatment or procedure; the risks, benefits, and possible complications of the intervention; the alternatives, including doing nothing; the risk(s) and benefit(s) of the alternative treatment(s) or procedure(s); and the risk(s) and benefit(s) of doing nothing. The patient was provided information about the general risks and possible complications associated with the procedure. These may include, but are not limited to: failure to achieve desired goals, infection,  bleeding, organ or nerve damage, allergic reactions, paralysis, and death. In addition, the patient was informed of those risks and complications associated to [facility]  procedures, such as failure to decrease pain; infection (i.e.: Meningitis, epidural or intraspinal abscess); bleeding (i.e.: epidural hematoma, subarachnoid hemorrhage, or any other type of intraspinal or peri-dural bleeding); organ or nerve damage (i.e.: Any type of peripheral nerve, nerve root, or spinal cord injury) with subsequent damage to sensory, motor, and/or autonomic systems, resulting in permanent pain, numbness, and/or weakness of one or several areas of the body; allergic reactions; (i.e.: anaphylactic reaction); and/or death. Furthermore, the patient was informed of those risks and complications associated with the medications. These include, but are not limited to: allergic reactions (i.e.: anaphylactic or anaphylactoid reaction(s)); adrenal axis suppression; blood sugar elevation that in diabetics may result in ketoacidosis or comma; water retention that in patients with history of congestive heart failure may result in shortness of breath, pulmonary edema, and decompensation with resultant heart failure; weight gain; swelling or edema; medication-induced neural toxicity; particulate matter embolism and blood vessel occlusion with resultant organ, and/or nervous system infarction; and/or aseptic necrosis of one or more joints. Finally, the patient was informed that Medicine is not an exact science; therefore, there is also the possibility of unforeseen or unpredictable risks and/or possible complications that may result in a catastrophic outcome. The patient indicated having understood very clearly. We have given the patient no guarantees and we have made no promises. Enough time was given to the patient to ask questions, all of which were answered to the patient's satisfaction. Logan Brown has indicated that he wanted to  continue with the procedure. Attestation: I, the ordering provider, attest that I have discussed with the patient the benefits, risks, side-effects, alternatives, likelihood of achieving goals, and potential problems during recovery for the procedure that I have provided informed consent. Date  Time: 04/20/2018  8:08 AM  Pre-Procedure Preparation:  Monitoring: As per clinic protocol. Respiration, ETCO2, SpO2, BP, heart rate and rhythm monitor placed and checked for adequate function Safety Precautions: Patient was assessed for positional comfort and pressure points before starting the procedure. Time-out: I initiated and conducted the "Time-out" before starting the procedure, as per protocol. The patient was asked to participate by confirming the accuracy of the "Time Out" information. Verification of the correct person, site, and procedure were performed and confirmed by me, the nursing staff, and the patient. "Time-out" conducted as per Joint Commission's Universal Protocol (UP.01.01.01). Time: 0855  Description of Procedure:          Area Prepped: Entire Posterior Lumbosacral Area Prepping solution: ChloraPrep (2% chlorhexidine gluconate and 70% isopropyl alcohol) Safety Precautions: Aspiration looking for blood return was conducted prior to all injections. At no point did we inject any substances, as a needle was being advanced. No attempts were made at seeking any paresthesias. Safe injection practices and needle disposal techniques used. Medications properly checked for expiration dates. SDV (single dose vial) medications used. Description of the Procedure: Protocol guidelines were followed. The patient was placed in position over the procedure table. The target area was identified and the area prepped in the usual manner. Skin & deeper tissues infiltrated with local anesthetic. Appropriate amount of time allowed to pass for local anesthetics to take effect. The procedure needles were then  advanced to the target area. Proper needle placement secured. Negative aspiration confirmed. Solution injected in intermittent fashion, asking for systemic symptoms every 0.5cc of injectate. The needles were then removed and the area cleansed, making sure to leave some of the prepping solution back to take advantage of its long term bactericidal properties.  Vitals:  04/20/18 0907 04/20/18 0917 04/20/18 0927 04/20/18 0937  BP: (!) 164/86 (!) 164/76 (!) 174/77 (!) 162/81  Pulse:      Resp: (!) 9 12 14 15   Temp:  (!) 97.5 F (36.4 C)  97.7 F (36.5 C)  SpO2: 96% 98% 98% 98%  Weight:      Height:        Start Time: 0855 hrs. End Time: 0907 hrs.  Materials:  Needle(s) Type: Spinal Needle Gauge: 22G Length: 3.5-in Medication(s): Please see orders for medications and dosing details.  Imaging Guidance (Spinal):          Type of Imaging Technique: Fluoroscopy Guidance (Spinal) Indication(s): Assistance in needle guidance and placement for procedures requiring needle placement in or near specific anatomical locations not easily accessible without such assistance. Exposure Time: Please see nurses notes. Contrast: Before injecting any contrast, we confirmed that the patient did not have an allergy to iodine, shellfish, or radiological contrast. Once satisfactory needle placement was completed at the desired level, radiological contrast was injected. Contrast injected under live fluoroscopy. No contrast complications. See chart for type and volume of contrast used. Fluoroscopic Guidance: I was personally present during the use of fluoroscopy. "Tunnel Vision Technique" used to obtain the best possible view of the target area. Parallax error corrected before commencing the procedure. "Direction-depth-direction" technique used to introduce the needle under continuous pulsed fluoroscopy. Once target was reached, antero-posterior, oblique, and lateral fluoroscopic projection used confirm needle  placement in all planes. Images permanently stored in EMR. Interpretation: I personally interpreted the imaging intraoperatively. Adequate needle placement confirmed in multiple planes. Appropriate spread of contrast into desired area was observed. No evidence of afferent or efferent intravascular uptake. No intrathecal or subarachnoid spread observed. Permanent images saved into the patient's record.  Antibiotic Prophylaxis:   Anti-infectives (From admission, onward)   None     Indication(s): None identified  Post-operative Assessment:  Post-procedure Vital Signs:  Pulse/HCG Rate: 63(!) 54 Temp: 97.7 F (36.5 C) Resp: 15 BP: (!) 162/81 SpO2: 98 %  EBL: None  Complications: No immediate post-treatment complications observed by team, or reported by patient.  Note: The patient tolerated the entire procedure well. A repeat set of vitals were taken after the procedure and the patient was kept under observation following institutional policy, for this type of procedure. Post-procedural neurological assessment was performed, showing return to baseline, prior to discharge. The patient was provided with post-procedure discharge instructions, including a section on how to identify potential problems. Should any problems arise concerning this procedure, the patient was given instructions to immediately contact us, at any time, without hesitation. In any case, we plan to contact the patient by telephone for a follow-up status report regarding this interventional procedure.  Comments:  No additional relevant information.  Plan of Care    Imaging Orders     DG C-Arm 1-60 Min-No Report  Procedure Orders     Lumbar Transforaminal Epidural  Medications ordered for procedure: Meds ordered this encounter  Medications  . iopamidol (ISOVUE-M) 41 % intrathecal injection 10 mL    Must be Myelogram-compatible. If not available, you may substitute with a water-soluble, non-ionic, hypoallergenic,  myelogram-compatible radiological contrast medium.  [name] Kitchen lidocaine (XYLOCAINE) 2 % (with pres) injection 400 mg  . midazolam (VERSED) 5 MG/5ML injection 1-2 mg    Make sure Flumazenil is available in the pyxis when using this medication. If oversedation occurs, administer 0.2 mg IV over 15 sec. If after 45 sec no response, administer 0.2  mg again over 1 min; may repeat at 1 min intervals; not to exceed 4 doses (1 mg)  . fentaNYL (SUBLIMAZE) injection 25-50 mcg    Make sure Narcan is available in the pyxis when using this medication. In the event of respiratory depression (RR< 8/min): Titrate NARCAN (naloxone) in increments of 0.1 to 0.2 mg IV at 2-3 minute intervals, until desired degree of reversal.  . lactated ringers infusion 1,000 mL  . sodium chloride flush (NS) 0.9 % injection 1 mL  . ropivacaine (PF) 2 mg/mL (0.2%) (NAROPIN) injection 1 mL  . dexamethasone (DECADRON) injection 10 mg  . sodium chloride flush (NS) 0.9 % injection 1 mL  . ropivacaine (PF) 2 mg/mL (0.2%) (NAROPIN) injection 1 mL  . dexamethasone (DECADRON) injection 10 mg   Medications administered: We administered iopamidol, lidocaine, midazolam, fentaNYL, lactated ringers, ropivacaine (PF) 2 mg/mL (0.2%), dexamethasone, sodium chloride flush, ropivacaine (PF) 2 mg/mL (0.2%), and dexamethasone.  See the medical record for exact dosing, route, and time of administration.  Disposition: Discharge home  Discharge Date & Time: 04/20/2018; 0941 hrs.   Physician-requested Follow-up: Return for post-procedure eval (2 wks), w/ Dr. Dossie Arbour.  Future Appointments  Date Time Provider [place]  04/26/2018  8:30 AM Shelton Silvas, PT [facility] None  04/28/2018  1:45 PM Shelton Silvas, PT [facility] None  04/29/2018  2:30 PM [name], MD [facility] None  05/04/2018  1:00 PM Shelton Silvas, PT [facility] None  05/05/2018  1:45 PM [name], MD [facility] None  05/06/2018  3:15 PM Shelton Silvas, PT [facility] None   05/11/2018  1:00 PM Shelton Silvas, PT [facility] None  05/12/2018  2:30 PM [facility] 1 [facility] None  05/12/2018  3:00 PM [facility] [place] [facility] None  05/13/2018  1:45 PM Shelton Silvas, PT [facility] None  05/18/2018  1:00 PM Shelton Silvas, PT [facility] None  05/20/2018 10:30 AM Shelton Silvas, PT [facility] None  05/25/2018  1:00 PM Shelton Silvas, PT [facility] None  05/27/2018  1:00 PM Shelton Silvas, PT [facility] None  06/01/2018  1:00 PM Shelton Silvas, PT [facility] None  06/03/2018  1:00 PM Shelton Silvas, PT [facility] None  06/08/2018  1:00 PM Shelton Silvas, PT [facility] None  06/09/2018  2:30 PM [facility] 1 [facility] None  06/09/2018  3:00 PM [facility] [place] [facility] None  07/07/2018  2:30 PM [facility] 1 [facility] None  07/07/2018  3:00 PM [facility] [place] [facility] None  08/04/2018  2:30 PM [facility] LAB 4 [facility] None  08/04/2018  3:00 PM Wyatt Portela, MD [facility] None  08/04/2018  3:30 PM [facility] [place] [facility] None  09/01/2018  2:30 PM [facility] LAB 4 [facility] None  09/01/2018  3:30 PM [facility] [place] None  01/18/2019  2:00 PM Liane Comber, NP [facility] None   Primary Care Physician: [name], MD Location: [facility] Outpatient Pain Management Facility Note by: Gaspar Cola, MD Date: 04/20/2018; Time: 10:28 AM  Disclaimer:  Medicine is not an exact science. The only guarantee in medicine is that nothing is guaranteed. It is important to note that the decision to proceed with this intervention was based on the information collected from the patient. The Data and conclusions were drawn from the patient's questionnaire, the interview, and the physical examination. Because the information was provided in large part by the patient, it cannot be guaranteed that it has not been purposely or unconsciously manipulated. Every effort has been made to obtain as much relevant data as possible for this  evaluation. It is  important to note that the conclusions that lead to this procedure are derived in large part from the available data. Always take into account that the treatment will also be dependent on availability of resources and existing treatment guidelines, considered by other Pain Management Practitioners as being common knowledge and practice, at the time of the intervention. For Medico-Legal purposes, it is also important to point out that variation in procedural techniques and pharmacological choices are the acceptable norm. The indications, contraindications, technique, and results of the above procedure should only be interpreted and judged by a Board-Certified Interventional Pain Specialist with extensive familiarity and expertise in the same exact procedure and technique.

## 2018-04-21 ENCOUNTER — Telehealth: Payer: Self-pay | Admitting: *Deleted

## 2018-04-21 NOTE — Telephone Encounter (Signed)
No problems post procedure. 

## 2018-04-26 ENCOUNTER — Ambulatory Visit: Payer: Medicare Other | Admitting: Physical Therapy

## 2018-04-28 ENCOUNTER — Ambulatory Visit: Payer: Medicare Other | Admitting: Physical Therapy

## 2018-04-29 ENCOUNTER — Ambulatory Visit (INDEPENDENT_AMBULATORY_CARE_PROVIDER_SITE_OTHER): Payer: Medicare Other | Admitting: Internal Medicine

## 2018-04-29 ENCOUNTER — Encounter: Payer: Self-pay | Admitting: Internal Medicine

## 2018-04-29 VITALS — BP 132/72 | HR 76 | Temp 97.5°F | Resp 18 | Ht 67.0 in | Wt 184.0 lb

## 2018-04-29 DIAGNOSIS — E782 Mixed hyperlipidemia: Secondary | ICD-10-CM

## 2018-04-29 DIAGNOSIS — Z79899 Other long term (current) drug therapy: Secondary | ICD-10-CM

## 2018-04-29 DIAGNOSIS — C7951 Secondary malignant neoplasm of bone: Secondary | ICD-10-CM

## 2018-04-29 DIAGNOSIS — R609 Edema, unspecified: Secondary | ICD-10-CM

## 2018-04-29 DIAGNOSIS — R7309 Other abnormal glucose: Secondary | ICD-10-CM

## 2018-04-29 DIAGNOSIS — C61 Malignant neoplasm of prostate: Secondary | ICD-10-CM

## 2018-04-29 DIAGNOSIS — R7303 Prediabetes: Secondary | ICD-10-CM | POA: Diagnosis not present

## 2018-04-29 DIAGNOSIS — E559 Vitamin D deficiency, unspecified: Secondary | ICD-10-CM | POA: Diagnosis not present

## 2018-04-29 DIAGNOSIS — I10 Essential (primary) hypertension: Secondary | ICD-10-CM

## 2018-04-29 MED ORDER — LOSARTAN POTASSIUM 100 MG PO TABS: ORAL_TABLET | ORAL | 1 refills | Status: DC

## 2018-04-29 MED ORDER — LABETALOL HCL 100 MG PO TABS: ORAL_TABLET | ORAL | 1 refills | Status: DC

## 2018-04-29 MED ORDER — FUROSEMIDE 40 MG PO TABS: ORAL_TABLET | ORAL | 1 refills | Status: DC

## 2018-04-29 NOTE — Patient Instructions (Signed)

## 2018-04-29 NOTE — Progress Notes (Signed)
This very nice 85 y.o. DWM presents for 3 month follow up with HTN, HLD, Pre-Diabetes and Vitamin D Deficiency.  Patient has remote hx/o Prostate Cancer (2000) treated with Surgery & Radiation. In the last year , he has has a recurrence of an aggressive metastatic Prostate Cancer & is followed locally  by [facility] (Dr Lovena Neighbours)  and Oncology (Dr Alen Blew). Patient receives monthly Xgeva and Lupron. Patient is also followed by Dr Dossie Arbour  For EDSI .      Currently patient has dual residences between [place] in [place] and maintains a residence in [place] where he is also established with [facility]. Patient indicates his intention today to return to [place] and is uncertain when he will be returning to [place].      Patient is treated for HTN (2005) & BP has been controlled at home. Patient has hx/o pAfib (2016) and had been treated with Xarelto til he developed hematuria & was switched to LD bASA.  He has local Cardiology f/u with Dr Percival Spanish. Today's BP was initially sl elevated & rechecked at goal - 132/72. Patient has had no complaints of any cardiac type chest pain, palpitations, dyspnea / orthopnea / PND, dizziness, claudication, or dependent edema.     Hyperlipidemia is controlled with diet & meds. Patient denies myalgias or other med SE's. Last Lipids were  Lab Results  Component Value Date   CHOL 203 (H) 06/01/2017   HDL 62 06/01/2017   LDLCALC 104 (H) 06/01/2017   TRIG 257 (H) 06/01/2017   CHOLHDL 3.3 06/01/2017      Also, the patient has history of PreDiabetes (A1c 6.3% / 2016)  and has had no symptoms of reactive hypoglycemia, diabetic polys, paresthesias or visual blurring.  Last A1c was improved, but not back to goal: Lab Results  Component Value Date   HGBA1C 5.9 (H) 06/01/2017      Further, the patient also has history of Vitamin D Deficiency and supplements vitamin D without any suspected side-effects. Last vitamin D was low: Lab  Results  Component Value Date   VD25OH 39 06/01/2017   Current Outpatient Medications on File Prior to Visit  Medication Sig  . ALPRAZolam (XANAX) 0.5 MG tablet Take 0.25 mg by mouth 2 (two) times daily as needed for anxiety.  [name] Kitchen aspirin EC 81 MG tablet Take 81 mg by mouth daily.  . busPIRone (BUSPAR) 10 MG tablet Take 10 mg by mouth. Takes 1/2 to 1 tablet 3 times daily with meals for nerves and anxiety.  . Cholecalciferol (VITAMIN D3) 25 MCG (1000 UT) CAPS Vitamin D3 1,000 unit capsule  Take by oral route.  . Cyanocobalamin (VITAMIN B-12) 5000 MCG SUBL Place 1 tablet (5,000 mcg total) under the tongue daily for 30 days.  [name] Kitchen gabapentin (NEURONTIN) 600 MG tablet Take 1/2 to 1 tablet 2 to 3 x /day for chronic pain  . hydrOXYzine (ATARAX/VISTARIL) 25 MG tablet Take 1 tablet (25 mg total) by mouth every 6 (six) hours as needed for itching.  . Multiple Vitamins-Minerals (CENTRUM SILVER ADULT 50+ PO) Take by mouth.  [name] Kitchen omeprazole (PRILOSEC) 20 MG capsule Take 1 capsule (20 mg total) by mouth daily.  [name] Kitchen oxycodone-acetaminophen (PERCOCET) 2.5-325 MG tablet Take 1 tablet by mouth every 6 (six) hours as needed for up to 30 days for pain. Must last 30 days  . [DISCONTINUED] lisinopril (PRINIVIL,ZESTRIL) 10 MG tablet Take 1 tablet (10 mg total) by mouth daily.   No current facility-administered  medications on file prior to visit.    Allergies  Allergen Reactions  . Ace Inhibitors Swelling    angioedema  . Hydrocodone Nausea And Vomiting   PMHx:   Past Medical History:  Diagnosis Date  . Arthritis   . Atrial fibrillation ([place])   . Bone cancer ([place])   . Cardiomyopathy ([place])   . Hyperlipidemia   . Irregular heartbeat   . Prostate CA ([place])    1997  . Prostate cancer ([place])   . Unstable gait 04/16/2018   unstable gait in home   Immunization History  Administered Date(s) Administered  . Influenza, High Dose Seasonal PF 02/19/2015, 01/08/2017   Past Surgical History:  Procedure Laterality Date   . CATARACT EXTRACTION    . EYE SURGERY Bilateral    IOL/CE on Lt in 1998 and Rt in 2011.  [name] Kitchen PROSTATE SURGERY    . WRIST FOREIGN BODY REMOVAL     1957 glass   FHx:    Reviewed / unchanged  SHx:    Reviewed / unchanged   Systems Review:  Constitutional: Denies fever, chills, wt changes, headaches, insomnia, fatigue, night sweats, change in appetite. Eyes: Denies redness, blurred vision, diplopia, discharge, itchy, watery eyes.  ENT: Denies discharge, congestion, post nasal drip, epistaxis, sore throat, earache, hearing loss, dental pain, tinnitus, vertigo, sinus pain, snoring.  CV: Denies chest pain, palpitations, irregular heartbeat, syncope, dyspnea, diaphoresis, orthopnea, PND, claudication or edema. Respiratory: denies cough, dyspnea, DOE, pleurisy, hoarseness, laryngitis, wheezing.  Gastrointestinal: Denies dysphagia, odynophagia, heartburn, reflux, water brash, abdominal pain or cramps, nausea, vomiting, bloating, diarrhea, constipation, hematemesis, melena, hematochezia  or hemorrhoids. Genitourinary: Denies dysuria, frequency, urgency, nocturia, hesitancy, discharge, hematuria or flank pain. Musculoskeletal: Denies arthralgias, myalgias, stiffness, jt. swelling, pain, limping or strain/sprain.  Skin: Denies pruritus, rash, hives, warts, acne, eczema or change in skin lesion(s). Neuro: No weakness, tremor, incoordination, spasms, paresthesia or pain. Psychiatric: Denies confusion, memory loss or sensory loss. Endo: Denies change in weight, skin or hair change.  Heme/Lymph: No excessive bleeding, bruising or enlarged lymph nodes.  Physical Exam  BP 132/72   Pulse 76   Temp (!) 97.5 F (36.4 C)   Resp 18   Ht 5\' 7"  (1.702 m)   Wt 184 lb (83.5 kg)   BMI 28.82 kg/m   Appears  well nourished, well groomed  and in no distress.  Eyes: PERRLA, EOMs, conjunctiva no swelling or erythema. Sinuses: No frontal/maxillary tenderness ENT/Mouth: EAC's clear, TM's nl w/o erythema,  bulging. Nares clear w/o erythema, swelling, exudates. Oropharynx clear without erythema or exudates. Oral hygiene is good. Tongue normal, non obstructing. Hearing intact.  Neck: Supple. Thyroid not palpable. Car 2+/2+ without bruits, nodes or JVD. Chest: Respirations nl with BS clear & equal w/o rales, rhonchi, wheezing or stridor.  Cor: Heart sounds normal w/ regular rate and rhythm without sig. murmurs, gallops, clicks or rubs. Peripheral pulses normal and equal  without edema.  Abdomen: Soft & bowel sounds normal. Non-tender w/o guarding, rebound, hernias, masses or organomegaly.  Lymphatics: Unremarkable.  Musculoskeletal: Full ROM all peripheral extremities, joint stability, 5/5 strength and normal gait.  Skin: Warm, dry without exposed rashes, lesions or ecchymosis apparent.  Neuro: Cranial nerves intact, reflexes equal bilaterally. Sensory-motor testing grossly intact. Tendon reflexes grossly intact.  Pysch: Alert & oriented x 3.  Insight and judgement nl & appropriate. No ideations.  Assessment and Plan:  1. Essential hypertension  - Continue medication, monitor blood pressure at home.  - Continue DASH diet.  Reminder to go to the ER if any CP,  SOB, nausea, dizziness, severe HA, changes vision/speech.  - labetalol (NORMODYNE) 100 MG tablet; Take 1 whole tablet 2 x /day - morning & night  Dispense: 180 tablet; Refill: 1 - losartan (COZAAR) 100 MG tablet; Take 1 tablet daily for BP ((Replaces Lisinopril))  Dispense: 90 tablet; Refill: 1 - furosemide (LASIX) 40 MG tablet; Take 1 tablet daily as needed for fluid retention & swelling  Dispense: 90 tablet; Refill: 1  2. Hyperlipidemia, mixed  - Continue diet/meds, exercise,& lifestyle modifications.  - Continue monitor periodic cholesterol/liver & renal functions    3. Abnormal glucose  - Continue diet, exercise  - Lifestyle modifications.  - Monitor appropriate labs.   4. Vitamin D deficiency  - Continue  supplementation.    5. Prediabetes  - Continue diet, exercise  - Lifestyle modifications.  - Monitor appropriate labs.  6. Prostate cancer metastatic to bone ([place])   7. Medication management   8. Edema  - furosemide (LASIX) 40 MG tablet; Take 1 tablet daily as needed for fluid retention & swelling  Dispense: 90 tablet; Refill: 1      Discussed  regular exercise, BP monitoring, weight control to achieve/maintain BMI less than 25 and discussed med and SE's.  Patient  just had recent labs done at the [facility]. Over 30 minutes of exam, counseling, chart review was performed.

## 2018-05-02 ENCOUNTER — Encounter: Payer: Self-pay | Admitting: Internal Medicine

## 2018-05-03 ENCOUNTER — Telehealth: Payer: Self-pay | Admitting: Pain Medicine

## 2018-05-03 ENCOUNTER — Encounter: Payer: Medicare Other | Admitting: Physical Therapy

## 2018-05-03 NOTE — Telephone Encounter (Signed)
[facility] called and stated that they would like for the referral to physical therapy to go to [address] in [place] because they live in [place] instead of [place].

## 2018-05-03 NOTE — Telephone Encounter (Signed)
Crystal, since we don't have any prescription pads with your name on them, Dr. Dossie Arbour advised that I print the order and have you sign it. I printed it and put it under your door. You can given it to [place] to send it to PT of patient's office.

## 2018-05-04 ENCOUNTER — Ambulatory Visit: Payer: Medicare Other | Admitting: Physical Therapy

## 2018-05-04 NOTE — Progress Notes (Signed)
Patient's Name: Logan Brown  MRN: [id number]  Referring Provider: [name], MD  DOB: [name] 15, 1932  PCP: [name], MD  DOS: 05/05/2018  Note by: Gaspar Cola, MD  Service setting: Ambulatory outpatient  Specialty: Interventional Pain Management  Location: [facility] (AMB) Pain Management Facility    Patient type: Established   Primary Reason(s) for Visit: Encounter for post-procedure evaluation of chronic illness with mild to moderate exacerbation CC: No chief complaint on file.  HPI  Mr. Prater is a 85 y.o. year old, male patient, who comes today for a post-procedure evaluation. He has HTN (hypertension); Mixed hyperlipidemia; Prediabetes; Vitamin D deficiency; Medication management; Depression, major, in remission ([place]); CKD (chronic kidney disease) stage 3, GFR 30-59 ml/min ([place]); Nonischemic cardiomyopathy (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 ([place]); History of prostate cancer; Osteopenia of spine; Osteopenia determined by x-ray; Osteoarthritis of facet joint of lumbar spine; Osteoarthritis involving multiple joints; DDD (degenerative disc disease), lumbar; Osteoarthritis of hip (Bilateral); Abnormal MRI, lumbar spine; Lumbar central spinal stenosis, w/o neurogenic claudication; 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 His primarily concern today is the No chief complaint on file.  Pain Assessment: Location: Lower Back Radiating: Denies Onset: More than a month ago Duration: Chronic pain Quality: Constant, Aching Severity: 2 /10 (subjective, self-reported pain score)  Note: Reported level is compatible with observation.                         When using our objective Pain Scale, levels between 6 and 10/10 are said to belong in an emergency room, as it progressively worsens from a 6/10, described as severely limiting, requiring emergency care not usually available at an outpatient pain management facility. At a 6/10 level, communication becomes difficult and requires great effort. Assistance to reach the emergency department may be required. Facial flushing and profuse sweating along with potentially dangerous increases in heart rate and blood pressure will be evident. Effect on ADL: limits my daily activities Timing: Constant Modifying factors: Medication BP: 128/85  HR: 65  Mr. Palma comes in today for post-procedure evaluation.  The patient indicates feeling significantly better after the transforaminal epidural steroid injections.  It turns out that he still on the fentanyl patch and even though he had been given the 12.5 mcg/h patch to start weaning it down, apparently he got confused and he used dose and what he is currently using and has 1 more left of his the 25 mcg/h patch.  I have instructed his wife to simply keep the patch in place for 1 week and once he gets to the end of that 1 week, he can then take the patch off and this would help bring down the dose that he gets from the medicine without necessarily going into any significant withdrawals.  Once we have him completely off of the fentanyl patch, we will continue to manage his pain with the  oxycodone along.  To further improve his pain, I will be  bringing him back for a second set of the transforaminal epidural steroid injection since the first one did help.  Further details on both, my assessment(s), as well as the proposed treatment plan, please see below.  Post-Procedure Assessment  05/03/2018 Procedure: Diagnostic bilateral L2 transforaminal LESI #1 under fluoroscopic guidance and IV sedation Pre-procedure pain score:  4/10 Post-procedure pain score: 0/10 (100% relief) Influential Factors: BMI: 28.66 kg/m Intra-procedural challenges: None observed.         Assessment challenges: None detected.              Reported side-effects: None.        Post-procedural adverse reactions or complications: None reported         Sedation: Sedation provided. When no sedatives are used, the analgesic levels obtained are directly associated to the effectiveness of the local anesthetics. However, when sedation is provided, the level of analgesia obtained during the initial 1 hour following the intervention, is believed to be the result of a combination of factors. These factors may include, but are not limited to: 1. The effectiveness of the local anesthetics used. 2. The effects of the analgesic(s) and/or anxiolytic(s) used. 3. The degree of discomfort experienced by the patient at the time of the procedure. 4. The patients ability and reliability in recalling and recording the events. 5. The presence and influence of possible secondary gains and/or psychosocial factors. Reported result: Relief experienced during the 1st hour after the procedure: 100 % (Ultra-Short Term Relief)            Interpretative annotation: Clinically appropriate result. Analgesia during this period is likely to be Local Anesthetic and/or IV Sedative (Analgesic/Anxiolytic) related.          Effects of local anesthetic: The analgesic effects attained during this period are directly associated to the localized infiltration of local anesthetics and therefore cary significant  diagnostic value as to the etiological location, or anatomical origin, of the pain. Expected duration of relief is directly dependent on the pharmacodynamics of the local anesthetic used. Long-acting (4-6 hours) anesthetics used.  Reported result: Relief during the next 4 to 6 hour after the procedure: 100 % (Short-Term Relief)            Interpretative annotation: Clinically appropriate result. Analgesia during this period is likely to be Local Anesthetic-related.          Long-term benefit: Defined as the period of time past the expected duration of local anesthetics (1 hour for short-acting and 4-6 hours for long-acting). With the possible exception of prolonged sympathetic blockade from the local anesthetics, benefits during this period are typically attributed to, or associated with, other factors such as analgesic sensory neuropraxia, antiinflammatory effects, or beneficial biochemical changes provided by agents other than the local anesthetics.  Reported result: Extended relief following procedure: 100 %(1 week) (Long-Term Relief)            Interpretative annotation: Clinically possible results. Good relief. No permanent benefit expected. Inflammation plays a part in the etiology to the pain.          Current benefits: Defined as reported results that persistent at this point in time.   Analgesia: >50 % Mr. Rainford reports improvement of extremity symptoms. Function: Mr. Dewolfe reports improvement in function ROM: Mr. Sequeira reports improvement in ROM Interpretative annotation: Ongoing benefit. Therapeutic benefit observed. Effective therapeutic approach.          Interpretation:  Results would suggest a successful diagnostic intervention.                  Plan:  Proceed with treatment No.: 2          Controlled Substance Pharmacotherapy Assessment REMS (Risk Evaluation and Mitigation Strategy)  Analgesic: Oxycodone/APAP 2.5/325 1 tablet p.o. every 6 hours (15 MME/day) + Duragesic 104mg/h  every 72 hours.  (75 MME/day) MME/day: 90 mg/day.  BChauncey Fischer RN  05/05/2018  1:50 PM  Sign when Signing Visit Nursing Pain Medication Assessment:  Safety precautions to be maintained throughout the outpatient stay will include: orient to surroundings, keep bed in low position, maintain call bell within reach at all times, provide assistance with transfer out of bed and ambulation.  Medication Inspection Compliance: Pill count conducted under aseptic conditions, in front of the patient. Neither the pills nor the bottle was removed from the patient's sight at any time. Once count was completed pills were immediately returned to the patient in their original bottle.  Medication: Hydrocodone/APAP Pill/Patch Count: 16 of 120 pills remain Pill/Patch Appearance: Markings consistent with prescribed medication Bottle Appearance: Standard pharmacy container. Clearly labeled. Filled Date: 1 / 368/ 2020 Last Medication intake:  Today   Pharmacokinetics: Liberation and absorption (onset of action): WNL Distribution (time to peak effect): WNL Metabolism and excretion (duration of action): WNL         Pharmacodynamics: Desired effects: Analgesia: Mr. PHighfillreports >50% benefit. Functional ability: Patient reports that medication allows him to accomplish basic ADLs Clinically meaningful improvement in function (CMIF): Sustained CMIF goals met Perceived effectiveness: Described as relatively effective, allowing for increase in activities of daily living (ADL) Undesirable effects: Side-effects or Adverse reactions: None reported Monitoring: [place] PMP: Online review of the past 165-montheriod conducted. Compliant with practice rules and regulations Last UDS on record: Summary  Date Value Ref Range Status  03/23/2018 FINAL  Final    Comment:    ==================================================================== TOXASSURE COMP DRUG  ANALYSIS,UR ==================================================================== Test                             Result       Flag       Units Drug Present and Declared for Prescription Verification   Fentanyl                       9            EXPECTED   ng/mg creat   Norfentanyl                    40           EXPECTED   ng/mg creat    Source of fentanyl is a scheduled prescription medication,    including IV, patch, and transmucosal formulations. Norfentanyl    is an expected metabolite of fentanyl.   Gabapentin                     PRESENT      EXPECTED Drug Present not Declared for Prescription Verification   Acetaminophen                  PRESENT      UNEXPECTED Drug Absent but Declared for Prescription Verification   Salicylate                     Not  Detected UNEXPECTED    Aspirin, as indicated in the declared medication list, is not    always detected even when used as directed.   Hydroxyzine                    Not Detected UNEXPECTED ==================================================================== Test                      Result    Flag   Units      Ref Range   Creatinine              235              mg/dL      >=20 ==================================================================== Declared Medications:  The flagging and interpretation on this report are based on the  following declared medications.  Unexpected results may arise from  inaccuracies in the declared medications.  **Note: The testing scope of this panel includes these medications:  Fentanyl (Fentanyl Patch)  Gabapentin  Hydroxyzine  **Note: The testing scope of this panel does not include small to  moderate amounts of these reported medications:  Aspirin (Aspirin 81)  **Note: The testing scope of this panel does not include following  reported medications:  Labetalol  Lisinopril  Multivitamin (MVI)  Omeprazole  Vitamin D3 ==================================================================== For  clinical consultation, please call [phone]. ====================================================================    UDS interpretation: Compliant          Medication Assessment Form: Reviewed. Patient indicates being compliant with therapy Treatment compliance: Compliant Risk Assessment Profile: Aberrant behavior: See initial evaluations. None observed or detected today Comorbid factors increasing risk of overdose: See initial evaluation. No additional risks detected today Opioid risk tool (ORT):  Opioid Risk  05/05/2018  Alcohol 0  Illegal Drugs 0  Rx Drugs 0  Alcohol 0  Illegal Drugs 0  Rx Drugs 0  Age between 16-45 years  0  History of Preadolescent Sexual Abuse 0  Psychological Disease 0  Depression 0  Opioid Risk Tool Scoring 0  Opioid Risk Interpretation Low Risk    ORT Scoring interpretation table:  Score <3 = Low Risk for SUD  Score between 4-7 = Moderate Risk for SUD  Score >8 = High Risk for Opioid Abuse   Risk of substance use disorder (SUD): Low  Risk Mitigation Strategies:  Patient Counseling: Covered Patient-Prescriber Agreement (PPA): Present and active  Notification to other healthcare providers: Done  Pharmacologic Plan: No change in therapy, at this time.              Laboratory Chemistry  Inflammation Markers (CRP: Acute Phase) (ESR: Chronic Phase) Lab Results  Component Value Date   CRP 6 03/23/2018   ESRSEDRATE 30 03/23/2018                         Renal Markers Lab Results  Component Value Date   BUN 21 04/12/2018   CREATININE 1.55 (H) 04/12/2018   BCR 17 03/23/2018   GFRAA 47 (L) 04/12/2018   GFRNONAA 41 (L) 04/12/2018                             Hepatic Markers Lab Results  Component Value Date   AST 19 04/12/2018   ALT 15 04/12/2018   ALBUMIN 3.7 04/12/2018  Note: Lab results reviewed.  Recent Imaging Results   Results for orders placed in visit on 04/20/18  DG C-Arm 1-60 Min-No Report    Narrative Fluoroscopy was utilized by the requesting physician.  No radiographic  interpretation.    Interpretation Report: Fluoroscopy was used during the procedure to assist with needle guidance. The images were interpreted intraoperatively by the requesting physician.  Meds   Current Outpatient Medications:  .  aspirin EC 81 MG tablet, Take 81 mg by mouth daily., Disp: , Rfl:  .  busPIRone (BUSPAR) 10 MG tablet, Take 10 mg by mouth. Takes 1/2 to 1 tablet 3 times daily with meals for nerves and anxiety., Disp: , Rfl:  .  Cholecalciferol (VITAMIN D3) 25 MCG (1000 UT) CAPS, Vitamin D3 1,000 unit capsule  Take by oral route., Disp: , Rfl:  .  Cyanocobalamin (VITAMIN B-12) 5000 MCG SUBL, Place 1 tablet (5,000 mcg total) under the tongue daily for 30 days., Disp: 30 each, Rfl: 0 .  fentaNYL (DURAGESIC) 25 MCG/HR, Place onto the skin every 3 (three) days., Disp: , Rfl:  .  furosemide (LASIX) 40 MG tablet, Take 1 tablet daily as needed for fluid retention & swelling, Disp: 90 tablet, Rfl: 1 .  gabapentin (NEURONTIN) 600 MG tablet, Take 1/2 to 1 tablet 2 to 3 x /day for chronic pain, Disp: 90 tablet, Rfl: 2 .  hydrOXYzine (ATARAX/VISTARIL) 25 MG tablet, Take 1 tablet (25 mg total) by mouth every 6 (six) hours as needed for itching., Disp: 30 tablet, Rfl: 1 .  labetalol (NORMODYNE) 100 MG tablet, Take 1 whole tablet 2 x /day - morning & night, Disp: 180 tablet, Rfl: 1 .  losartan (COZAAR) 100 MG tablet, Take 1 tablet daily for BP ((Replaces Lisinopril)), Disp: 90 tablet, Rfl: 1 .  Multiple Vitamins-Minerals (CENTRUM SILVER ADULT 50+ PO), Take by mouth., Disp: , Rfl:  .  omeprazole (PRILOSEC) 20 MG capsule, Take 1 capsule (20 mg total) by mouth daily., Disp: 90 capsule, Rfl: 1 .  [START ON 05/06/2018] oxycodone-acetaminophen (PERCOCET) 2.5-325 MG tablet, Take 1 tablet by mouth every 6 (six) hours as needed for up to 30 days for pain. Must last 30 days, Disp: 120 tablet, Rfl: 0  ROS  Constitutional:  Denies any fever or chills Gastrointestinal: No reported hemesis, hematochezia, vomiting, or acute GI distress Musculoskeletal: Denies any acute onset joint swelling, redness, loss of ROM, or weakness Neurological: No reported episodes of acute onset apraxia, aphasia, dysarthria, agnosia, amnesia, paralysis, loss of coordination, or loss of consciousness  Allergies  Mr. Closser is allergic to ace inhibitors and hydrocodone.  PFSH  Drug: Mr. Tally  reports no history of drug use. Alcohol:  reports no history of alcohol use. Tobacco:  reports that he quit smoking about 43 years ago. He has never used smokeless tobacco. Medical:  has a past medical history of Arthritis, Atrial fibrillation ([place]), Bone cancer ([place]), Cardiomyopathy ([place]), Hyperlipidemia, Irregular heartbeat, Prostate CA ([place]), Prostate cancer ([place]), and Unstable gait (04/16/2018). Surgical: Mr. Dawood  has a past surgical history that includes Eye surgery (Bilateral); Cataract extraction; Prostate surgery; and Wrist foreign body removal. Family: family history is not on file.  Constitutional Exam  General appearance: Well nourished, well developed, and well hydrated. In no apparent acute distress Vitals:   05/05/18 1336  BP: 128/85  Pulse: 65  Temp: (!) 97.5 F (36.4 C)  SpO2: 99%  Weight: 183 lb (83 kg)  Height: '5\' 7"'$  (1.702 m)   BMI Assessment: Estimated  body mass index is 28.66 kg/m as calculated from the following:   Height as of this encounter: '5\' 7"'$  (1.702 m).   Weight as of this encounter: 183 lb (83 kg).  BMI interpretation table: BMI level Category Range association with higher incidence of chronic pain  <18 kg/m2 Underweight   18.5-24.9 kg/m2 Ideal body weight   25-29.9 kg/m2 Overweight Increased incidence by 20%  30-34.9 kg/m2 Obese (Class I) Increased incidence by 68%  35-39.9 kg/m2 Severe obesity (Class II) Increased incidence by 136%  >40 kg/m2 Extreme obesity (Class III) Increased incidence by 254%    Patient's current BMI Ideal Body weight  Body mass index is 28.66 kg/m. Ideal body weight: 66.1 kg (145 lb 11.6 oz) Adjusted ideal body weight: 72.9 kg (160 lb 10.1 oz)   BMI Readings from Last 4 Encounters:  05/05/18 28.66 kg/m  04/29/18 28.82 kg/m  04/20/18 28.35 kg/m  04/13/18 28.46 kg/m   Wt Readings from Last 4 Encounters:  05/05/18 183 lb (83 kg)  04/29/18 184 lb (83.5 kg)  04/20/18 181 lb (82.1 kg)  04/13/18 181 lb 11.2 oz (82.4 kg)  Psych/Mental status: Alert, oriented x 3 (person, place, & time)       Eyes: PERLA Respiratory: No evidence of acute respiratory distress  Cervical Spine Area Exam  Skin & Axial Inspection: No masses, redness, edema, swelling, or associated skin lesions Alignment: Symmetrical Functional ROM: Unrestricted ROM      Stability: No instability detected Muscle Tone/Strength: Functionally intact. No obvious neuro-muscular anomalies detected. Sensory (Neurological): Unimpaired Palpation: No palpable anomalies              Upper Extremity (UE) Exam    Side: Right upper extremity  Side: Left upper extremity  Skin & Extremity Inspection: Skin color, temperature, and hair growth are WNL. No peripheral edema or cyanosis. No masses, redness, swelling, asymmetry, or associated skin lesions. No contractures.  Skin & Extremity Inspection: Skin color, temperature, and hair growth are WNL. No peripheral edema or cyanosis. No masses, redness, swelling, asymmetry, or associated skin lesions. No contractures.  Functional ROM: Unrestricted ROM          Functional ROM: Unrestricted ROM          Muscle Tone/Strength: Functionally intact. No obvious neuro-muscular anomalies detected.  Muscle Tone/Strength: Functionally intact. No obvious neuro-muscular anomalies detected.  Sensory (Neurological): Unimpaired          Sensory (Neurological): Unimpaired          Palpation: No palpable anomalies              Palpation: No palpable anomalies              Provocative  Test(s):  Phalen's test: deferred Tinel's test: deferred Apley's scratch test (touch opposite shoulder):  Action 1 (Across chest): deferred Action 2 (Overhead): deferred Action 3 (LB reach): deferred   Provocative Test(s):  Phalen's test: deferred Tinel's test: deferred Apley's scratch test (touch opposite shoulder):  Action 1 (Across chest): deferred Action 2 (Overhead): deferred Action 3 (LB reach): deferred    Thoracic Spine Area Exam  Skin & Axial Inspection: No masses, redness, or swelling Alignment: Symmetrical Functional ROM: Unrestricted ROM Stability: No instability detected Muscle Tone/Strength: Functionally intact. No obvious neuro-muscular anomalies detected. Sensory (Neurological): Unimpaired Muscle strength & Tone: No palpable anomalies  Lumbar Spine Area Exam  Skin & Axial Inspection: No masses, redness, or swelling Alignment: Symmetrical Functional ROM: Unrestricted ROM       Stability: No instability  detected Muscle Tone/Strength: Functionally intact. No obvious neuro-muscular anomalies detected. Sensory (Neurological): Unimpaired Palpation: No palpable anomalies       Provocative Tests: Hyperextension/rotation test: deferred today       Lumbar quadrant test (Kemp's test): deferred today       Lateral bending test: deferred today       Patrick's Maneuver: deferred today                   FABER* test: deferred today                   S-I anterior distraction/compression test: deferred today         S-I lateral compression test: deferred today         S-I Thigh-thrust test: deferred today         S-I Gaenslen's test: deferred today         *(Flexion, ABduction and External Rotation)  Gait & Posture Assessment  Ambulation: Unassisted Gait: Relatively normal for age and body habitus Posture: The patient is using a back brace for stability and comfort.   Lower Extremity Exam    Side: Right lower extremity  Side: Left lower extremity  Stability: No  instability observed          Stability: No instability observed          Skin & Extremity Inspection: Skin color, temperature, and hair growth are WNL. No peripheral edema or cyanosis. No masses, redness, swelling, asymmetry, or associated skin lesions. No contractures.  Skin & Extremity Inspection: Skin color, temperature, and hair growth are WNL. No peripheral edema or cyanosis. No masses, redness, swelling, asymmetry, or associated skin lesions. No contractures.  Functional ROM: Unrestricted ROM                  Functional ROM: Unrestricted ROM                  Muscle Tone/Strength: Functionally intact. No obvious neuro-muscular anomalies detected.  Muscle Tone/Strength: Functionally intact. No obvious neuro-muscular anomalies detected.  Sensory (Neurological): Unimpaired        Sensory (Neurological): Unimpaired        DTR: Patellar: deferred today Achilles: deferred today Plantar: deferred today  DTR: Patellar: deferred today Achilles: deferred today Plantar: deferred today  Palpation: No palpable anomalies  Palpation: No palpable anomalies   Assessment   Status Diagnosis  Controlled Controlled Controlled 1. Chronic low back pain (Primary Area of Pain) (Bilateral) (R>L) w/o sciatica   2. 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 ([place])   3. Compression fracture of L2 lumbar vertebra, sequela   4. Metastatic cancer to spine ([place])   5. Abnormal MRI, lumbar spine   6. Chronic pain syndrome      Updated Problems: Problem  Abnormal Mri, Lumbar Spine   FINDINGS: (07/20/2017 lumbosacral spine MRI) Bone: Acute L2 superior endplate fracture.  Metastatic disease involving every level of the lumbar spine most prominent at the L1 and L5 vertebral bodies and in the sacrum.  Epidural extension of tumor cannot be excluded. L1-2: Mild right foraminal narrowing. L2-3: Moderate central spinal stenosis secondary to disc bulge and mild bilateral facet joint degenerative changes.  There is mild  left foraminal narrowing. L3-4: Mild central spinal stenosis secondary to a left eccentric posterior disc bulge and mild left facet joint degenerative changes.  There is mild left foraminal narrowing. L4-5: Moderate central spinal stenosis secondary to a posterior osteophyte ridge and mild to moderate  bilateral facet joint degenerative changes.  There is mild bilateral foraminal narrowing. L5-S1: Mild to moderate bilateral facet joint degenerative changes without a significant disc bulge or central spinal stenosis.  There is mild right foraminal narrowing.  IMPRESSION: Multilevel bony metastatic disease which is most prominent in the sacrum and L1 and L5 vertebral bodies. Acute mild compression fracture in the L2 superior endplate.  A pathologic fracture cannot be excluded. Suspected small hematoma in the L1-2 and L1 right posterolateral epidural space causing moderate central spinal stenosis. Central spinal stenosis secondary to degenerative change which is moderate at L2-3 and L4-5, mild to moderate at L5-S1, and mild at L3-4.     Plan of Care  Pharmacotherapy (Medications Ordered): Meds ordered this encounter  Medications  . oxycodone-acetaminophen (PERCOCET) 2.5-325 MG tablet    Sig: Take 1 tablet by mouth every 6 (six) hours as needed for up to 30 days for pain. Must last 30 days    Dispense:  120 tablet    Refill:  0    [place] STOP ACT - Not applicable to Chronic Pain Syndrome (G89.4) diagnosis. Fill one day early if pharmacy is closed on scheduled refill date. Do not fill until: 05/06/18. To last until: 06/05/18.   Medications administered today: Cathren Laine had no medications administered during this visit.   Procedure Orders     Lumbar Transforaminal Epidural Lab Orders  No laboratory test(s) ordered today   Imaging Orders  No imaging studies ordered today    Referral Orders     Ambulatory referral to Physical Therapy   Interventional management options: Planned,  scheduled, and/or pending:   Diagnostic bilateral L2 transforaminal LESI #2 under fluoroscopic guidance and IV sedation   Considering:   Diagnostic IV lidocaine infusion  Diagnostic bilateral transforaminal L2 LESI  Diagnostic bilateral lumbar facet nerve block  Possible bilateral lumbar facet RFA    Palliative PRN treatment(s):   Diagnostic bilateral transforaminal L2 LESI    Provider-requested follow-up: Return for Procedure (w/ sedation): (B) L2 TFESI #2.  Future Appointments  Date Time Provider [place]  05/12/2018  2:30 PM [facility] 1 [facility] None  05/12/2018  3:00 PM [facility] [place] [facility] None  05/13/2018  8:15 AM [name], MD [facility] None  06/09/2018  2:30 PM [facility] 1 [facility] None  06/09/2018  3:00 PM [facility] [place] FLUSH [facility] None  07/07/2018  2:30 PM [facility] 1 [facility] None  07/07/2018  3:00 PM [facility] [place] FLUSH [facility] None  08/04/2018  2:30 PM [facility] 4 [facility] None  08/04/2018  3:00 PM Wyatt Portela, MD [facility] None  08/04/2018  3:30 PM [facility] [place] FLUSH [facility] None  09/01/2018  2:30 PM [facility] 4 [facility] None  09/01/2018  3:30 PM [facility] [place] None  01/18/2019  2:00 PM Liane Comber, NP [facility] None   Primary Care Physician: [name], MD Location: [facility] Note by: Gaspar Cola, MD Date: 05/05/2018; Time: 2:11 PM

## 2018-05-05 ENCOUNTER — Ambulatory Visit: Payer: Medicare Other | Attending: Pain Medicine | Admitting: Pain Medicine

## 2018-05-05 ENCOUNTER — Other Ambulatory Visit: Payer: Self-pay

## 2018-05-05 ENCOUNTER — Encounter: Payer: Self-pay | Admitting: Pain Medicine

## 2018-05-05 VITALS — BP 128/85 | HR 65 | Temp 97.5°F | Ht 67.0 in | Wt 183.0 lb

## 2018-05-05 DIAGNOSIS — G893 Neoplasm related pain (acute) (chronic): Secondary | ICD-10-CM | POA: Diagnosis not present

## 2018-05-05 DIAGNOSIS — C7951 Secondary malignant neoplasm of bone: Secondary | ICD-10-CM | POA: Diagnosis not present

## 2018-05-05 DIAGNOSIS — G8929 Other chronic pain: Secondary | ICD-10-CM

## 2018-05-05 DIAGNOSIS — M545 Low back pain, unspecified: Secondary | ICD-10-CM

## 2018-05-05 DIAGNOSIS — R937 Abnormal findings on diagnostic imaging of other parts of musculoskeletal system: Secondary | ICD-10-CM | POA: Insufficient documentation

## 2018-05-05 DIAGNOSIS — S32020S Wedge compression fracture of second lumbar vertebra, sequela: Secondary | ICD-10-CM | POA: Insufficient documentation

## 2018-05-05 DIAGNOSIS — G894 Chronic pain syndrome: Secondary | ICD-10-CM | POA: Insufficient documentation

## 2018-05-05 MED ORDER — OXYCODONE-ACETAMINOPHEN 2.5-325 MG PO TABS: 1.0000 | ORAL_TABLET | Freq: Four times a day (QID) | ORAL | 0 refills | Status: DC | PRN

## 2018-05-05 NOTE — Patient Instructions (Signed)
____________________________________________________________________________________________  Preparing for Procedure with Sedation  Instructions: . Oral Intake: Do not eat or drink anything for at least 8 hours prior to your procedure. . Transportation: Public transportation is not allowed. Bring an adult driver. The driver must be physically present in our waiting room before any procedure can be started. . Physical Assistance: Bring an adult physically capable of assisting you, in the event you need help. This adult should keep you company at home for at least 6 hours after the procedure. . Blood Pressure Medicine: Take your blood pressure medicine with a sip of water the morning of the procedure. . Blood thinners: Notify our staff if you are taking any blood thinners. Depending on which one you take, there will be specific instructions on how and when to stop it. . Diabetics on insulin: Notify the staff so that you can be scheduled 1st case in the morning. If your diabetes requires high dose insulin, take only  of your normal insulin dose the morning of the procedure and notify the staff that you have done so. . Preventing infections: Shower with an antibacterial soap the morning of your procedure. . Build-up your immune system: Take 1000 mg of Vitamin C with every meal (3 times a day) the day prior to your procedure. . Antibiotics: Inform the staff if you have a condition or reason that requires you to take antibiotics before dental procedures. . Pregnancy: If you are pregnant, call and cancel the procedure. . Sickness: If you have a cold, fever, or any active infections, call and cancel the procedure. . Arrival: You must be in the facility at least 30 minutes prior to your scheduled procedure. . Children: Do not bring children with you. . Dress appropriately: Bring dark clothing that you would not mind if they get stained. . Valuables: Do not bring any jewelry or valuables.  Procedure  appointments are reserved for interventional treatments only. . No Prescription Refills. . No medication changes will be discussed during procedure appointments. . No disability issues will be discussed.  Reasons to call and reschedule or cancel your procedure: (Following these recommendations will minimize the risk of a serious complication.) . Surgeries: Avoid having procedures within 2 weeks of any surgery. (Avoid for 2 weeks before or after any surgery). . Flu Shots: Avoid having procedures within 2 weeks of a flu shots or . (Avoid for 2 weeks before or after immunizations). . Barium: Avoid having a procedure within 7-10 days after having had a radiological study involving the use of radiological contrast. (Myelograms, Barium swallow or enema study). . Heart attacks: Avoid any elective procedures or surgeries for the initial 6 months after a "Myocardial Infarction" (Heart Attack). . Blood thinners: It is imperative that you stop these medications before procedures. Let us know if you if you take any blood thinner.  . Infection: Avoid procedures during or within two weeks of an infection (including chest colds or gastrointestinal problems). Symptoms associated with infections include: Localized redness, fever, chills, night sweats or profuse sweating, burning sensation when voiding, cough, congestion, stuffiness, runny nose, sore throat, diarrhea, nausea, vomiting, cold or Flu symptoms, recent or current infections. It is specially important if the infection is over the area that we intend to treat. . Heart and lung problems: Symptoms that may suggest an active cardiopulmonary problem include: cough, chest pain, breathing difficulties or shortness of breath, dizziness, ankle swelling, uncontrolled high or unusually low blood pressure, and/or palpitations. If you are experiencing any of these symptoms, cancel   your procedure and contact your primary care physician for an evaluation.  Remember:   Regular Business hours are:  Monday to Thursday 8:00 AM to 4:00 PM  Provider's Schedule: Eileen Croswell, MD:  Procedure days: Tuesday and Thursday 7:30 AM to 4:00 PM  Bilal Lateef, MD:  Procedure days: Monday and Wednesday 7:30 AM to 4:00 PM ____________________________________________________________________________________________    

## 2018-05-05 NOTE — Progress Notes (Signed)
Nursing Pain Medication Assessment:  Safety precautions to be maintained throughout the outpatient stay will include: orient to surroundings, keep bed in low position, maintain call bell within reach at all times, provide assistance with transfer out of bed and ambulation.  Medication Inspection Compliance: Pill count conducted under aseptic conditions, in front of the patient. Neither the pills nor the bottle was removed from the patient's sight at any time. Once count was completed pills were immediately returned to the patient in their original bottle.  Medication: Hydrocodone/APAP Pill/Patch Count: 16 of 120 pills remain Pill/Patch Appearance: Markings consistent with prescribed medication Bottle Appearance: Standard pharmacy container. Clearly labeled. Filled Date: 1 / 81 / 2020 Last Medication intake:  Today

## 2018-05-06 ENCOUNTER — Ambulatory Visit: Payer: Medicare Other | Admitting: Physical Therapy

## 2018-05-11 ENCOUNTER — Encounter: Payer: Medicare Other | Admitting: Physical Therapy

## 2018-05-12 ENCOUNTER — Inpatient Hospital Stay: Payer: Medicare Other | Attending: Oncology

## 2018-05-12 ENCOUNTER — Inpatient Hospital Stay: Payer: Medicare Other

## 2018-05-12 VITALS — BP 170/71 | HR 51 | Temp 98.4°F | Resp 18

## 2018-05-12 DIAGNOSIS — C61 Malignant neoplasm of prostate: Secondary | ICD-10-CM | POA: Insufficient documentation

## 2018-05-12 DIAGNOSIS — Z79899 Other long term (current) drug therapy: Secondary | ICD-10-CM | POA: Insufficient documentation

## 2018-05-12 DIAGNOSIS — C7951 Secondary malignant neoplasm of bone: Secondary | ICD-10-CM | POA: Diagnosis not present

## 2018-05-12 DIAGNOSIS — S32020S Wedge compression fracture of second lumbar vertebra, sequela: Secondary | ICD-10-CM

## 2018-05-12 LAB — CBC WITH DIFFERENTIAL (CANCER CENTER ONLY)
Abs Immature Granulocytes: 0 10*3/uL (ref 0.00–0.07)
BASOS PCT: 1 %
Basophils Absolute: 0 10*3/uL (ref 0.0–0.1)
Eosinophils Absolute: 0.2 10*3/uL (ref 0.0–0.5)
Eosinophils Relative: 4 %
HCT: 37 % — ABNORMAL LOW (ref 39.0–52.0)
Hemoglobin: 12.1 g/dL — ABNORMAL LOW (ref 13.0–17.0)
Immature Granulocytes: 0 %
Lymphocytes Relative: 37 %
Lymphs Abs: 1.7 10*3/uL (ref 0.7–4.0)
MCH: 32.8 pg (ref 26.0–34.0)
MCHC: 32.7 g/dL (ref 30.0–36.0)
MCV: 100.3 fL — ABNORMAL HIGH (ref 80.0–100.0)
Monocytes Absolute: 0.5 10*3/uL (ref 0.1–1.0)
Monocytes Relative: 11 %
NEUTROS ABS: 2.1 10*3/uL (ref 1.7–7.7)
Neutrophils Relative %: 47 %
Platelet Count: 145 10*3/uL — ABNORMAL LOW (ref 150–400)
RBC: 3.69 MIL/uL — ABNORMAL LOW (ref 4.22–5.81)
RDW: 13.2 % (ref 11.5–15.5)
WBC: 4.4 10*3/uL (ref 4.0–10.5)
nRBC: 0 % (ref 0.0–0.2)

## 2018-05-12 LAB — COMPREHENSIVE METABOLIC PANEL
ALT: 20 U/L (ref 0–44)
AST: 26 U/L (ref 15–41)
Albumin: 4.1 g/dL (ref 3.5–5.0)
Alkaline Phosphatase: 55 U/L (ref 38–126)
Anion gap: 4 — ABNORMAL LOW (ref 5–15)
BUN: 22 mg/dL (ref 8–23)
CO2: 28 mmol/L (ref 22–32)
Calcium: 9.3 mg/dL (ref 8.9–10.3)
Chloride: 111 mmol/L (ref 98–111)
Creatinine, Ser: 1.55 mg/dL — ABNORMAL HIGH (ref 0.61–1.24)
GFR calc Af Amer: 47 mL/min — ABNORMAL LOW (ref >60)
GFR calc non Af Amer: 41 mL/min — ABNORMAL LOW (ref >60)
Glucose, Bld: 103 mg/dL — ABNORMAL HIGH (ref 70–99)
Potassium: 4.7 mmol/L (ref 3.5–5.1)
SODIUM: 143 mmol/L (ref 135–145)
Total Bilirubin: 0.6 mg/dL (ref 0.3–1.2)
Total Protein: 6.8 g/dL (ref 6.5–8.1)

## 2018-05-12 MED ORDER — DENOSUMAB 120 MG/1.7ML ~~LOC~~ SOLN
SUBCUTANEOUS | Status: AC
  Filled 2018-05-12: qty 1.7

## 2018-05-12 MED ORDER — DENOSUMAB 120 MG/1.7ML ~~LOC~~ SOLN
120.0000 mg | Freq: Once | SUBCUTANEOUS | Status: AC
  Administered 2018-05-12: 120 mg via SUBCUTANEOUS

## 2018-05-12 NOTE — Patient Instructions (Signed)
Denosumab Delton See) injection What is this medicine? DENOSUMAB (den oh sue mab) slows bone breakdown. Prolia is used to treat osteoporosis in women after menopause and in men, and in people who are taking corticosteroids for 6 months or more. Delton See is used to treat a high calcium level due to cancer and to prevent bone fractures and other bone problems caused by multiple myeloma or cancer bone metastases. Delton See is also used to treat giant cell tumor of the bone. This medicine may be used for other purposes; ask your health care provider or pharmacist if you have questions. COMMON BRAND NAME(S): Prolia, XGEVA What should I tell my health care provider before I take this medicine? They need to know if you have any of these conditions: -dental disease -having surgery or tooth extraction -infection -kidney disease -low levels of calcium or Vitamin D in the blood -malnutrition -on hemodialysis -skin conditions or sensitivity -thyroid or parathyroid disease -an unusual reaction to denosumab, other medicines, foods, dyes, or preservatives -pregnant or trying to get pregnant -breast-feeding How should I use this medicine? This medicine is for injection under the skin. It is given by a health care professional in a hospital or clinic setting. A special MedGuide will be given to you before each treatment. Be sure to read this information carefully each time. For Prolia, talk to your pediatrician regarding the use of this medicine in children. Special care may be needed. For Delton See, talk to your pediatrician regarding the use of this medicine in children. While this drug may be prescribed for children as young as 13 years for selected conditions, precautions do apply. Overdosage: If you think you have taken too much of this medicine contact a poison control center or emergency room at once. NOTE: This medicine is only for you. Do not share this medicine with others. What if I miss a dose? It is important  not to miss your dose. Call your doctor or health care professional if you are unable to keep an appointment. What may interact with this medicine? Do not take this medicine with any of the following medications: -other medicines containing denosumab This medicine may also interact with the following medications: -medicines that lower your chance of fighting infection -steroid medicines like prednisone or cortisone This list may not describe all possible interactions. Give your health care provider a list of all the medicines, herbs, non-prescription drugs, or dietary supplements you use. Also tell them if you smoke, drink alcohol, or use illegal drugs. Some items may interact with your medicine. What should I watch for while using this medicine? Visit your doctor or health care professional for regular checks on your progress. Your doctor or health care professional may order blood tests and other tests to see how you are doing. Call your doctor or health care professional for advice if you get a fever, chills or sore throat, or other symptoms of a cold or flu. Do not treat yourself. This drug may decrease your body's ability to fight infection. Try to avoid being around people who are sick. You should make sure you get enough calcium and vitamin D while you are taking this medicine, unless your doctor tells you not to. Discuss the foods you eat and the vitamins you take with your health care professional. See your dentist regularly. Brush and floss your teeth as directed. Before you have any dental work done, tell your dentist you are receiving this medicine. Do not become pregnant while taking this medicine or for 5  months after stopping it. Talk with your doctor or health care professional about your birth control options while taking this medicine. Women should inform their doctor if they wish to become pregnant or think they might be pregnant. There is a potential for serious side effects to an  unborn child. Talk to your health care professional or pharmacist for more information. What side effects may I notice from receiving this medicine? Side effects that you should report to your doctor or health care professional as soon as possible: -allergic reactions like skin rash, itching or hives, swelling of the face, lips, or tongue -bone pain -breathing problems -dizziness -jaw pain, especially after dental work -redness, blistering, peeling of the skin -signs and symptoms of infection like fever or chills; cough; sore throat; pain or trouble passing urine -signs of low calcium like fast heartbeat, muscle cramps or muscle pain; pain, tingling, numbness in the hands or feet; seizures -unusual bleeding or bruising -unusually weak or tired Side effects that usually do not require medical attention (report to your doctor or health care professional if they continue or are bothersome): -constipation -diarrhea -headache -joint pain -loss of appetite -muscle pain -runny nose -tiredness -upset stomach This list may not describe all possible side effects. Call your doctor for medical advice about side effects. You may report side effects to FDA at 1-800-FDA-1088. Where should I keep my medicine? This medicine is only given in a clinic, doctor's office, or other health care setting and will not be stored at home. NOTE: This sheet is a summary. It may not cover all possible information. If you have questions about this medicine, talk to your doctor, pharmacist, or health care provider.  2019 [employer]vier/Gold Standard (2017-07-03 16:10:44)

## 2018-05-13 ENCOUNTER — Other Ambulatory Visit: Payer: Self-pay

## 2018-05-13 ENCOUNTER — Ambulatory Visit (HOSPITAL_BASED_OUTPATIENT_CLINIC_OR_DEPARTMENT_OTHER): Payer: Medicare Other | Admitting: Pain Medicine

## 2018-05-13 ENCOUNTER — Encounter: Payer: Medicare Other | Admitting: Physical Therapy

## 2018-05-13 ENCOUNTER — Ambulatory Visit
Admission: RE | Admit: 2018-05-13 | Discharge: 2018-05-13 | Disposition: A | Discharge status: Home / Self Care | Payer: Medicare Other | Source: Ambulatory Visit | Attending: Pain Medicine | Admitting: Pain Medicine

## 2018-05-13 VITALS — BP 173/79 | HR 54 | Temp 98.0°F | Resp 18 | Ht 67.0 in | Wt 183.0 lb

## 2018-05-13 DIAGNOSIS — M5136 Other intervertebral disc degeneration, lumbar region: Secondary | ICD-10-CM

## 2018-05-13 DIAGNOSIS — S32020S Wedge compression fracture of second lumbar vertebra, sequela: Secondary | ICD-10-CM | POA: Insufficient documentation

## 2018-05-13 DIAGNOSIS — I4891 Unspecified atrial fibrillation: Secondary | ICD-10-CM

## 2018-05-13 DIAGNOSIS — R001 Bradycardia, unspecified: Secondary | ICD-10-CM | POA: Insufficient documentation

## 2018-05-13 DIAGNOSIS — I959 Hypotension, unspecified: Secondary | ICD-10-CM | POA: Diagnosis not present

## 2018-05-13 DIAGNOSIS — M48061 Spinal stenosis, lumbar region without neurogenic claudication: Secondary | ICD-10-CM | POA: Insufficient documentation

## 2018-05-13 DIAGNOSIS — C7951 Secondary malignant neoplasm of bone: Secondary | ICD-10-CM | POA: Insufficient documentation

## 2018-05-13 DIAGNOSIS — G893 Neoplasm related pain (acute) (chronic): Secondary | ICD-10-CM | POA: Insufficient documentation

## 2018-05-13 DIAGNOSIS — Z79899 Other long term (current) drug therapy: Secondary | ICD-10-CM | POA: Diagnosis not present

## 2018-05-13 DIAGNOSIS — I428 Other cardiomyopathies: Secondary | ICD-10-CM | POA: Insufficient documentation

## 2018-05-13 DIAGNOSIS — I951 Orthostatic hypotension: Secondary | ICD-10-CM | POA: Insufficient documentation

## 2018-05-13 DIAGNOSIS — G894 Chronic pain syndrome: Secondary | ICD-10-CM

## 2018-05-13 DIAGNOSIS — R008 Other abnormalities of heart beat: Secondary | ICD-10-CM | POA: Diagnosis not present

## 2018-05-13 DIAGNOSIS — M51369 Other intervertebral disc degeneration, lumbar region without mention of lumbar back pain or lower extremity pain: Secondary | ICD-10-CM

## 2018-05-13 LAB — PROSTATE-SPECIFIC AG, SERUM (LABCORP): Prostate Specific Ag, Serum: <0.1 ng/mL (ref 0.0–4.0)

## 2018-05-13 MED ORDER — ROPIVACAINE HCL 2 MG/ML IJ SOLN
INTRAMUSCULAR | Status: AC
  Filled 2018-05-13: qty 10

## 2018-05-13 MED ORDER — DEXAMETHASONE SODIUM PHOSPHATE 10 MG/ML IJ SOLN
INTRAMUSCULAR | Status: AC
  Filled 2018-05-13: qty 2

## 2018-05-13 MED ORDER — SODIUM CHLORIDE 0.9% FLUSH
1.0000 mL | Freq: Once | INTRAVENOUS | Status: AC
  Administered 2018-05-13: 1 mL

## 2018-05-13 MED ORDER — EPHEDRINE SULFATE 50 MG/ML IJ SOLN
10.0000 mg | Freq: Once | INTRAMUSCULAR | Status: AC
  Administered 2018-05-13: 10 mg via INTRAMUSCULAR

## 2018-05-13 MED ORDER — FENTANYL CITRATE (PF) 100 MCG/2ML IJ SOLN: 25.0000 ug | INTRAMUSCULAR | Status: DC | PRN

## 2018-05-13 MED ORDER — IOPAMIDOL (ISOVUE-M 200) INJECTION 41%
10.0000 mL | Freq: Once | INTRAMUSCULAR | Status: AC
  Administered 2018-05-13: 10 mL via EPIDURAL
  Filled 2018-05-13: qty 10

## 2018-05-13 MED ORDER — LACTATED RINGERS IV SOLN: 1000.0000 mL | Freq: Once | INTRAVENOUS | Status: DC

## 2018-05-13 MED ORDER — DEXAMETHASONE SODIUM PHOSPHATE 10 MG/ML IJ SOLN
10.0000 mg | Freq: Once | INTRAMUSCULAR | Status: AC
  Administered 2018-05-13: 10 mg

## 2018-05-13 MED ORDER — MIDAZOLAM HCL 5 MG/5ML IJ SOLN: 1.0000 mg | INTRAMUSCULAR | Status: DC | PRN

## 2018-05-13 MED ORDER — LIDOCAINE HCL 2 % IJ SOLN
INTRAMUSCULAR | Status: AC
  Filled 2018-05-13: qty 20

## 2018-05-13 MED ORDER — OXYCODONE HCL 5 MG PO TABS: 5.0000 mg | ORAL_TABLET | Freq: Four times a day (QID) | ORAL | 0 refills | Status: DC | PRN

## 2018-05-13 MED ORDER — ROPIVACAINE HCL 2 MG/ML IJ SOLN
1.0000 mL | Freq: Once | INTRAMUSCULAR | Status: AC
  Administered 2018-05-13: 1 mL via EPIDURAL

## 2018-05-13 MED ORDER — SODIUM CHLORIDE (PF) 0.9 % IJ SOLN
INTRAMUSCULAR | Status: AC
  Filled 2018-05-13: qty 10

## 2018-05-13 MED ORDER — LIDOCAINE HCL 2 % IJ SOLN
20.0000 mL | Freq: Once | INTRAMUSCULAR | Status: AC
  Administered 2018-05-13: 400 mg

## 2018-05-13 NOTE — Progress Notes (Signed)
When sitting patient up after procedure, Logan Brown felt dizzy. Watched monitor, see VS. o2 initated and laid down right lateral position for comfort. Pt pale in color, no sweating noted. MD to room, monitored and EKG ordered. Stayed with patient until transfer to recovery.

## 2018-05-13 NOTE — Progress Notes (Signed)
Safety precautions to be maintained throughout the outpatient stay will include: orient to surroundings, keep bed in low position, maintain call bell within reach at all times, provide assistance with transfer out of bed and ambulation.  

## 2018-05-13 NOTE — Patient Instructions (Addendum)
____________________________________________________________________________________________  Post-Procedure Discharge Instructions  Instructions: Apply ice:  Purpose: This will minimize any swelling and discomfort after procedure.  When: Day of procedure, as soon as you get home. How: Fill a plastic sandwich bag with crushed ice. Cover it with a small towel and apply to injection site. How long: (15 min on, 15 min off) Apply for 15 minutes then remove x 15 minutes.  Repeat sequence on day of procedure, until you go to bed. Apply heat:  Purpose: To treat any soreness and discomfort from the procedure. When: Starting the next day after the procedure. How: Apply heat to procedure site starting the day following the procedure. How long: May continue to repeat daily, until discomfort goes away. Food intake: Start with clear liquids (like water) and advance to regular food, as tolerated.  Physical activities: Keep activities to a minimum for the first 8 hours after the procedure. After that, then as tolerated. Driving: If you have received any sedation, be responsible and do not drive. You are not allowed to drive for 24 hours after having sedation. Blood thinner: (Applies only to those taking blood thinners) You may restart your blood thinner 6 hours after your procedure. Insulin: (Applies only to Diabetic patients taking insulin) As soon as you can eat, you may resume your normal dosing schedule. Infection prevention: Keep procedure site clean and dry. Shower daily and clean area with soap and water. Post-procedure Pain Diary: Extremely important that this be done correctly and accurately. Recorded information will be used to determine the next step in treatment. For the purpose of accuracy, follow these rules: Evaluate only the area treated. Do not report or include pain from an untreated area. For the purpose of this evaluation, ignore all other areas of pain, except for the treated  area. After your procedure, avoid taking a long nap and attempting to complete the pain diary after you wake up. Instead, set your alarm clock to go off every hour, on the hour, for the initial 8 hours after the procedure. Document the duration of the numbing medicine, and the relief you are getting from it. Do not go to sleep and attempt to complete it later. It will not be accurate. If you received sedation, it is likely that you were given a medication that may cause amnesia. Because of this, completing the diary at a later time may cause the information to be inaccurate. This information is needed to plan your care. Follow-up appointment: Keep your post-procedure follow-up evaluation appointment after the procedure (usually 2 weeks for most procedures, 6 weeks for radiofrequencies). DO NOT FORGET to bring you pain diary with you.   Expect: (What should I expect to see with my procedure?) From numbing medicine (AKA: Local Anesthetics): Numbness or decrease in pain. You may also experience some weakness, which if present, could last for the duration of the local anesthetic. Onset: Full effect within 15 minutes of injected. Duration: It will depend on the type of local anesthetic used. On the average, 1 to 8 hours.  From steroids (Applies only if steroids were used): Decrease in swelling or inflammation. Once inflammation is improved, relief of the pain will follow. Onset of benefits: Depends on the amount of swelling present. The more swelling, the longer it will take for the benefits to be seen. In some cases, up to 10 days. Duration: Steroids will stay in the system x 2 weeks. Duration of benefits will depend on multiple posibilities including persistent irritating factors. Side-effects: If present, they   may typically last 2 weeks (the duration of the steroids). Frequent: Cramps (if they occur, drink Gatorade and take over-the-counter Magnesium 450-500 mg once to twice a day); water retention with  temporary weight gain; increases in blood sugar; decreased immune system response; increased appetite. Occasional: Facial flushing (red, warm cheeks); mood swings; menstrual changes. Uncommon: Long-term decrease or suppression of natural hormones; bone thinning. (These are more common with higher doses or more frequent use. This is why we prefer that our patients avoid having any injection therapies in other practices.)  Very Rare: Severe mood changes; psychosis; aseptic necrosis. From procedure: Some discomfort is to be expected once the numbing medicine wears off. This should be minimal if ice and heat are applied as instructed.  Call if: (When should I call?) You experience numbness and weakness that gets worse with time, as opposed to wearing off. New onset bowel or bladder incontinence. (Applies only to procedures done in the spine)  Emergency Numbers: [place] business hours (Monday - Thursday, 8:00 AM - 4:00 PM) (Friday, 9:00 AM - 12:00 Noon): [phone] After hours: [phone] NOTE: If you are having a problem and are unable connect with, or to talk to a provider, then go to your nearest urgent care or emergency department. If the problem is serious and urgent, please call 911. ____________________________________________________________________________________________   ____________________________________________________________________________________________  Post-Procedure Discharge Instructions  Instructions: Apply ice:  Purpose: This will minimize any swelling and discomfort after procedure.  When: Day of procedure, as soon as you get home. How: Fill a plastic sandwich bag with crushed ice. Cover it with a small towel and apply to injection site. How long: (15 min on, 15 min off) Apply for 15 minutes then remove x 15 minutes.  Repeat sequence on day of procedure, until you go to bed. Apply heat:  Purpose: To treat any soreness and discomfort from the  procedure. When: Starting the next day after the procedure. How: Apply heat to procedure site starting the day following the procedure. How long: May continue to repeat daily, until discomfort goes away. Food intake: Start with clear liquids (like water) and advance to regular food, as tolerated.  Physical activities: Keep activities to a minimum for the first 8 hours after the procedure. After that, then as tolerated. Driving: If you have received any sedation, be responsible and do not drive. You are not allowed to drive for 24 hours after having sedation. Blood thinner: (Applies only to those taking blood thinners) You may restart your blood thinner 6 hours after your procedure. Insulin: (Applies only to Diabetic patients taking insulin) As soon as you can eat, you may resume your normal dosing schedule. Infection prevention: Keep procedure site clean and dry. Shower daily and clean area with soap and water. Post-procedure Pain Diary: Extremely important that this be done correctly and accurately. Recorded information will be used to determine the next step in treatment. For the purpose of accuracy, follow these rules: Evaluate only the area treated. Do not report or include pain from an untreated area. For the purpose of this evaluation, ignore all other areas of pain, except for the treated area. After your procedure, avoid taking a long nap and attempting to complete the pain diary after you wake up. Instead, set your alarm clock to go off every hour, on the hour, for the initial 8 hours after the procedure. Document the duration of the numbing medicine, and the relief you are getting from it. Do not go to sleep and attempt   to complete it later. It will not be accurate. If you received sedation, it is likely that you were given a medication that may cause amnesia. Because of this, completing the diary at a later time may cause the information to be inaccurate. This information is needed to plan  your care. Follow-up appointment: Keep your post-procedure follow-up evaluation appointment after the procedure (usually 2 weeks for most procedures, 6 weeks for radiofrequencies). DO NOT FORGET to bring you pain diary with you.   Expect: (What should I expect to see with my procedure?) From numbing medicine (AKA: Local Anesthetics): Numbness or decrease in pain. You may also experience some weakness, which if present, could last for the duration of the local anesthetic. Onset: Full effect within 15 minutes of injected. Duration: It will depend on the type of local anesthetic used. On the average, 1 to 8 hours.  From steroids (Applies only if steroids were used): Decrease in swelling or inflammation. Once inflammation is improved, relief of the pain will follow. Onset of benefits: Depends on the amount of swelling present. The more swelling, the longer it will take for the benefits to be seen. In some cases, up to 10 days. Duration: Steroids will stay in the system x 2 weeks. Duration of benefits will depend on multiple posibilities including persistent irritating factors. Side-effects: If present, they may typically last 2 weeks (the duration of the steroids). Frequent: Cramps (if they occur, drink Gatorade and take over-the-counter Magnesium 450-500 mg once to twice a day); water retention with temporary weight gain; increases in blood sugar; decreased immune system response; increased appetite. Occasional: Facial flushing (red, warm cheeks); mood swings; menstrual changes. Uncommon: Long-term decrease or suppression of natural hormones; bone thinning. (These are more common with higher doses or more frequent use. This is why we prefer that our patients avoid having any injection therapies in other practices.)  Very Rare: Severe mood changes; psychosis; aseptic necrosis. From procedure: Some discomfort is to be expected once the numbing medicine wears off. This should be minimal if ice and heat are  applied as instructed.  Call if: (When should I call?) You experience numbness and weakness that gets worse with time, as opposed to wearing off. New onset bowel or bladder incontinence. (Applies only to procedures done in the spine)  Emergency Numbers: [place] business hours (Monday - Thursday, 8:00 AM - 4:00 PM) (Friday, 9:00 AM - 12:00 Noon): [phone] After hours: [phone] NOTE: If you are having a problem and are unable connect with, or to talk to a provider, then go to your nearest urgent care or emergency department. If the problem is serious and urgent, please call 911. ____________________________________________________________________________________________   

## 2018-05-13 NOTE — Progress Notes (Signed)
Patient's Name: Logan Brown  MRN: [id number]  Referring Provider: [name], MD  DOB: 12-Feb-1932  PCP: [name], MD  DOS: 05/13/2018  Note by: Gaspar Cola, MD  Service setting: Ambulatory outpatient  Specialty: Interventional Pain Management  Patient type: Established  Location: [facility] (AMB) Pain Management Facility  Visit type: Interventional Procedure   Primary Reason for Visit: Interventional Pain Management Treatment. CC: Back Pain (lower)  Procedure:          Anesthesia, Analgesia, Anxiolysis:  Type: Trans-Foraminal Epidural Steroid Injection #2  Purpose: Diagnostic/Therapeutic Region: Posterolateral Lumbosacral Target Area: The 6 o'clock position under the pedicle, on the affected side. Approach: Posterior Percutaneous Paravertebral approach. Level: L2 Level Laterality: Bilateral Paravertebral  Type: Local Anesthesia Indication(s): Analgesia         Route: Infiltration ([place]/IM) IV Access: Declined Sedation: None since NPO instructions were not followed  Local Anesthetic: Lidocaine 1-2%  Position: Prone   Indications: 1. DDD (degenerative disc disease), lumbar   2. Compression fracture of L2 lumbar vertebra, sequela   3. 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 Metastatic cancer to spine ([place])   5. Lumbar central spinal stenosis, w/o neurogenic claudication    Pain Score: Pre-procedure: 3 /10 Post-procedure: 0-No pain/10  Pre-op Assessment:  Logan Brown is a 85 y.o. (year old), male patient, seen today for interventional treatment. He  has a past surgical history that includes Eye surgery (Bilateral); Cataract extraction; Prostate surgery; and Wrist foreign body removal. Logan Brown has a current medication list which includes the following prescription(s): aspirin ec, buspirone, vitamin d3, furosemide, gabapentin, hydroxyzine, labetalol, losartan, multiple vitamins-minerals, omeprazole,  oxycodone-acetaminophen, oxycodone, and lisinopril. His primarily concern today is the Back Pain (lower)  Initial Vital Signs:  Pulse/HCG Rate: 72ECG Heart Rate: (!) 54 Temp: 98 F (36.7 C) Resp: 16 BP: (!) 143/82 SpO2: 98 %  BMI: Estimated body mass index is 28.66 kg/m as calculated from the following:   Height as of this encounter: 5\' 7"  (1.702 m).   Weight as of this encounter: 183 lb (83 kg).  Risk Assessment: Allergies: Reviewed. He is allergic to ace inhibitors and hydrocodone.  Allergy Precautions: None required Coagulopathies: Reviewed. None identified.  Blood-thinner therapy: None at this time Active Infection(s): Reviewed. None identified. Logan Brown is afebrile  Site Confirmation: Logan Brown was asked to confirm the procedure and laterality before marking the site Procedure checklist: Completed Consent: Before the procedure and under the influence of no sedative(s), amnesic(s), or anxiolytics, the patient was informed of the treatment options, risks and possible complications. To fulfill our ethical and legal obligations, as recommended by the American Medical Association's Code of Ethics, I have informed the patient of my clinical impression; the nature and purpose of the treatment or procedure; the risks, benefits, and possible complications of the intervention; the alternatives, including doing nothing; the risk(s) and benefit(s) of the alternative treatment(s) or procedure(s); and the risk(s) and benefit(s) of doing nothing. The patient was provided information about the general risks and possible complications associated with the procedure. These may include, but are not limited to: failure to achieve desired goals, infection, bleeding, organ or nerve damage, allergic reactions, paralysis, and death. In addition, the patient was informed of those risks and complications associated to Spine-related procedures, such as failure to decrease pain; infection (i.e.: Meningitis,  epidural or intraspinal abscess); bleeding (i.e.: epidural hematoma, subarachnoid hemorrhage, or any other type of intraspinal or peri-dural bleeding); organ or nerve damage (i.e.:  Any type of peripheral nerve, nerve root, or spinal cord injury) with subsequent damage to sensory, motor, and/or autonomic systems, resulting in permanent pain, numbness, and/or weakness of one or several areas of the body; allergic reactions; (i.e.: anaphylactic reaction); and/or death. Furthermore, the patient was informed of those risks and complications associated with the medications. These include, but are not limited to: allergic reactions (i.e.: anaphylactic or anaphylactoid reaction(s)); adrenal axis suppression; blood sugar elevation that in diabetics may result in ketoacidosis or comma; water retention that in patients with history of congestive heart failure may result in shortness of breath, pulmonary edema, and decompensation with resultant heart failure; weight gain; swelling or edema; medication-induced neural toxicity; particulate matter embolism and blood vessel occlusion with resultant organ, and/or nervous system infarction; and/or aseptic necrosis of one or more joints. Finally, the patient was informed that Medicine is not an exact science; therefore, there is also the possibility of unforeseen or unpredictable risks and/or possible complications that may result in a catastrophic outcome. The patient indicated having understood very clearly. We have given the patient no guarantees and we have made no promises. Enough time was given to the patient to ask questions, all of which were answered to the patient's satisfaction. Logan Brown has indicated that he wanted to continue with the procedure. Attestation: I, the ordering provider, attest that I have discussed with the patient the benefits, risks, side-effects, alternatives, likelihood of achieving goals, and potential problems during recovery for the procedure  that I have provided informed consent. Date  Time: 05/13/2018  8:14 AM  Pre-Procedure Preparation:  Monitoring: As per clinic protocol. Respiration, ETCO2, SpO2, BP, heart rate and rhythm monitor placed and checked for adequate function Safety Precautions: Patient was assessed for positional comfort and pressure points before starting the procedure. Time-out: I initiated and conducted the "Time-out" before starting the procedure, as per protocol. The patient was asked to participate by confirming the accuracy of the "Time Out" information. Verification of the correct person, site, and procedure were performed and confirmed by me, the nursing staff, and the patient. "Time-out" conducted as per Joint Commission's Universal Protocol (UP.01.01.01). Time: 0901  Description of Procedure:          Area Prepped: Entire Posterior Lumbosacral Area Prepping solution: ChloraPrep (2% chlorhexidine gluconate and 70% isopropyl alcohol) Safety Precautions: Aspiration looking for blood return was conducted prior to all injections. At no point did we inject any substances, as a needle was being advanced. No attempts were made at seeking any paresthesias. Safe injection practices and needle disposal techniques used. Medications properly checked for expiration dates. SDV (single dose vial) medications used. Description of the Procedure: Protocol guidelines were followed. The patient was placed in position over the procedure table. The target area was identified and the area prepped in the usual manner. Skin & deeper tissues infiltrated with local anesthetic. Appropriate amount of time allowed to pass for local anesthetics to take effect. The procedure needles were then advanced to the target area. Proper needle placement secured. Negative aspiration confirmed. Solution injected in intermittent fashion, asking for systemic symptoms every 0.5cc of injectate. The needles were then removed and the area cleansed, making sure to  leave some of the prepping solution back to take advantage of its long term bactericidal properties.  Vitals:   05/13/18 1000 05/13/18 1010 05/13/18 1020 05/13/18 1030  BP: (!) 162/77 (!) 169/72 (!) 170/79 (!) 173/79  Pulse:      Resp: 13 15 16 18   Temp:  TempSrc:      SpO2: 97% 97% 97% 97%  Weight:      Height:        Start Time: 0901 hrs. End Time: 0914 hrs.  Materials:  Needle(s) Type: Spinal Needle Gauge: 22G Length: 3.5-in Medication(s): Please see orders for medications and dosing details.  Imaging Guidance (Spinal):          Type of Imaging Technique: Fluoroscopy Guidance (Spinal) Indication(s): Assistance in needle guidance and placement for procedures requiring needle placement in or near specific anatomical locations not easily accessible without such assistance. Exposure Time: Please see nurses notes. Contrast: Before injecting any contrast, we confirmed that the patient did not have an allergy to iodine, shellfish, or radiological contrast. Once satisfactory needle placement was completed at the desired level, radiological contrast was injected. Contrast injected under live fluoroscopy. No contrast complications. See chart for type and volume of contrast used. Fluoroscopic Guidance: I was personally present during the use of fluoroscopy. "Tunnel Vision Technique" used to obtain the best possible view of the target area. Parallax error corrected before commencing the procedure. "Direction-depth-direction" technique used to introduce the needle under continuous pulsed fluoroscopy. Once target was reached, antero-posterior, oblique, and lateral fluoroscopic projection used confirm needle placement in all planes. Images permanently stored in EMR. Interpretation: I personally interpreted the imaging intraoperatively. Adequate needle placement confirmed in multiple planes. Appropriate spread of contrast into desired area was observed. No evidence of afferent or efferent  intravascular uptake. No intrathecal or subarachnoid spread observed. Permanent images saved into the patient's record.  Antibiotic Prophylaxis:   Anti-infectives (From admission, onward)   None     Indication(s): None identified  Post-operative Assessment:  Post-procedure Vital Signs:  Pulse/HCG Rate: (!) 5460 Temp: 98 F (36.7 C) Resp: 18 BP: (!) 173/79 SpO2: 97 %  EBL: None  Complications: No immediate post-treatment complications observed by team, or reported by patient.  After the procedure, the patient experienced orthostatic hypotension upon attempting to sit up and get out of the procedure table.  His heart rate and blood pressure went down and he was given 10 mg of IM ephedrine.  He does have a history of atrial fibrillation, but his heart rate went down from 71 to 57.  Because of this we went ahead and order a stat EKG.  Note: The patient tolerated the entire procedure well. A repeat set of vitals were taken after the procedure and the patient was kept under observation following institutional policy, for this type of procedure. Post-procedural neurological assessment was performed, showing return to baseline, prior to discharge. The patient was provided with post-procedure discharge instructions, including a section on how to identify potential problems. Should any problems arise concerning this procedure, the patient was given instructions to immediately contact us, at any time, without hesitation. In any case, we plan to contact the patient by telephone for a follow-up status report regarding this interventional procedure.  Comments:  EKG completed.  It shows left ventricular hypertrophy, and also shows that a 62-month atrial fibrillation with a slow response.  The patient has fully recovered and indicates feeling well with no chest pain, chest discomfort, or difficulty breathing.  We have arranged for the patient to see his cardiologist tomorrow.  Being on atrial fibrillation I  am concerned that he is not on any type of blood thinner.  He indicates that when he lived in [place] he was taking some, but he has not been taking any here.  Plan of Care  Orders:  Orders Placed This Encounter  Procedures  . Lumbar Transforaminal Epidural    Scheduling Instructions:     Side: Bilateral     Level: L2     Sedation: With Sedation.     Timeframe: Today    Order Specific Question:   Where will this procedure be performed?    Answer:   [facility]  . DG C-Arm 1-60 Min-No Report    Intraoperative interpretation by procedural physician at [place].    Standing Status:   Standing    Number of Occurrences:   1    Order Specific Question:   Reason for exam:    Answer:   Assistance in needle guidance and placement for procedures requiring needle placement in or near specific anatomical locations not easily accessible without such assistance.  . Ambulatory referral to Cardiology    Referral Priority:   Routine    Referral Type:   Consultation    Referral Reason:   Specialty Services Required    Requested Specialty:   Cardiology    Number of Visits Requested:   1  . Provider attestation of informed consent for procedure/surgical case    I, the ordering provider, attest that I have discussed with the patient the benefits, risks, side effects, alternatives, likelihood of achieving goals and potential problems during recovery for the procedure that I have provided informed consent.    Standing Status:   Standing    Number of Occurrences:   1  . Informed Consent Details: Transcribe to consent form and obtain patient signature    Consent Attestation: I, the ordering provider, attest that I have discussed with the patient the benefits, risks, side-effects, alternatives, likelihood of achieving goals, and potential problems during recovery for the procedure that I have provided informed consent.    Standing Status:   Standing    Number of Occurrences:   1     Order Specific Question:   Procedure    Answer:   Diagnostic lumbar transforaminal epidural steroid injection under fluoroscopic guidance. (See notes for level and laterality.)    Order Specific Question:   Surgeon    Answer:   Gursimran Litaker A. Dossie Arbour, MD    Order Specific Question:   Indication/Reason    Answer:   Lumbar radiculopathy/radiculitis  . EKG 12-Lead    Standing Status:   Standing    Number of Occurrences:   1    Order Specific Question:   Reason for Exam    Answer:   Cardiac Dysrhythmia    Medications ordered for procedure: Meds ordered this encounter  Medications  . iopamidol (ISOVUE-M) 41 % intrathecal injection 10 mL    Must be Myelogram-compatible. If not available, you may substitute with a water-soluble, non-ionic, hypoallergenic, myelogram-compatible radiological contrast medium.  [name] Kitchen lidocaine (XYLOCAINE) 2 % (with pres) injection 400 mg  . DISCONTD: midazolam (VERSED) 5 MG/5ML injection 1-2 mg    Make sure Flumazenil is available in the pyxis when using this medication. If oversedation occurs, administer 0.2 mg IV over 15 sec. If after 45 sec no response, administer 0.2 mg again over 1 min; may repeat at 1 min intervals; not to exceed 4 doses (1 mg)  . DISCONTD: fentaNYL (SUBLIMAZE) injection 25-50 mcg    Make sure Narcan is available in the pyxis when using this medication. In the event of respiratory depression (RR< 8/min): Titrate NARCAN (naloxone) in increments of 0.1 to 0.2 mg IV at 2-3 minute intervals, until desired degree of reversal.  .  DISCONTD: lactated ringers infusion 1,000 mL  . sodium chloride flush (NS) 0.9 % injection 1 mL  . ropivacaine (PF) 2 mg/mL (0.2%) (NAROPIN) injection 1 mL  . dexamethasone (DECADRON) injection 10 mg  . sodium chloride flush (NS) 0.9 % injection 1 mL  . ropivacaine (PF) 2 mg/mL (0.2%) (NAROPIN) injection 1 mL  . dexamethasone (DECADRON) injection 10 mg  . oxyCODONE (OXY IR/ROXICODONE) 5 MG immediate release tablet    Sig:  Take 1 tablet (5 mg total) by mouth every 6 (six) hours as needed for up to 30 days for severe pain. Must last 30 days.    Dispense:  120 tablet    Refill:  0    [place] STOP ACT - Not applicable to Chronic Pain Syndrome (G89.4) & Cancer Pain diagnosis. Fill one day early if pharmacy is closed on scheduled refill date. Do not fill until: 05/13/18. To last until: 06/12/18.  [name] Kitchen ePHEDrine injection 10-25 mg   Medications administered: We administered iopamidol, lidocaine, sodium chloride flush, ropivacaine (PF) 2 mg/mL (0.2%), dexamethasone, sodium chloride flush, ropivacaine (PF) 2 mg/mL (0.2%), dexamethasone, and ePHEDrine.  See the medical record for exact dosing, route, and time of administration.  Disposition: Discharge home  Discharge Date & Time: 05/13/2018; 1037 hrs.   Follow-up plan:   Return for PPE (2 wks) w/ Dr. Dossie Arbour.  When he returns, we will also check to see how he is doing on his oxycodone IR 5 mg every 6 hours.  By the time he returns he should have finished with his Duragesic.   Future Appointments  Date Time Provider [place]  05/14/2018  8:30 AM Almyra Deforest, [place] [facility]  05/17/2018  1:30 PM Clint Guy, PT [facility]  06/07/2018  9:15 AM [name], MD [facility] None  06/09/2018  2:30 PM [facility] 1 [facility] None  06/09/2018  3:00 PM [facility] [place] [facility] None  07/07/2018  2:30 PM [facility] 1 [facility] None  07/07/2018  3:00 PM [facility] [place] [facility] None  08/04/2018  2:30 PM [facility] LAB 4 [facility] None  08/04/2018  3:00 PM Wyatt Portela, MD [facility] None  08/04/2018  3:30 PM [facility] [place] [facility] None  09/01/2018  2:30 PM [facility] LAB 4 [facility] None  09/01/2018  3:30 PM [facility] [place] None  01/18/2019  2:00 PM Liane Comber, NP [facility] None   Primary Care Physician: [name], MD Location: [facility] Note by: Gaspar Cola,  MD Date: 05/13/2018; Time: 10:43 AM  Disclaimer:  Medicine is not an exact science. The only guarantee in medicine is that nothing is guaranteed. It is important to note that the decision to proceed with this intervention was based on the information collected from the patient. The Data and conclusions were drawn from the patient's questionnaire, the interview, and the physical examination. Because the information was provided in large part by the patient, it cannot be guaranteed that it has not been purposely or unconsciously manipulated. Every effort has been made to obtain as much relevant data as possible for this evaluation. It is important to note that the conclusions that lead to this procedure are derived in large part from the available data. Always take into account that the treatment will also be dependent on availability of resources and existing treatment guidelines, considered by other Pain Management Practitioners as being common knowledge and practice, at the time of the intervention. For Medico-Legal purposes, it is also important to point out  that variation in procedural techniques and pharmacological choices are the acceptable norm. The indications, contraindications, technique, and results of the above procedure should only be interpreted and judged by a Board-Certified Interventional Pain Specialist with extensive familiarity and expertise in the same exact procedure and technique.

## 2018-05-13 NOTE — Progress Notes (Signed)
1018 Cardiopulmonary here for 12 lead. [address] Dr. Dossie Arbour in to see patient. EKG gone over with patient and copy given for him to take to cardiologist in the morning. Patient/friend understands discharge instructions.OK for discharge per MD. VSS. Denies dizziness upon sitting and standing positions.

## 2018-05-14 ENCOUNTER — Encounter: Payer: Self-pay | Admitting: Physician Assistant

## 2018-05-14 ENCOUNTER — Ambulatory Visit (INDEPENDENT_AMBULATORY_CARE_PROVIDER_SITE_OTHER): Payer: Medicare Other | Admitting: Physician Assistant

## 2018-05-14 ENCOUNTER — Ambulatory Visit: Payer: Medicare Other | Admitting: Physician Assistant

## 2018-05-14 ENCOUNTER — Telehealth: Payer: Self-pay | Admitting: *Deleted

## 2018-05-14 VITALS — BP 148/83 | HR 70 | Ht 67.0 in | Wt 182.0 lb

## 2018-05-14 DIAGNOSIS — I480 Paroxysmal atrial fibrillation: Secondary | ICD-10-CM | POA: Diagnosis not present

## 2018-05-14 DIAGNOSIS — C61 Malignant neoplasm of prostate: Secondary | ICD-10-CM | POA: Diagnosis not present

## 2018-05-14 DIAGNOSIS — E785 Hyperlipidemia, unspecified: Secondary | ICD-10-CM | POA: Diagnosis not present

## 2018-05-14 DIAGNOSIS — R42 Dizziness and giddiness: Secondary | ICD-10-CM

## 2018-05-14 DIAGNOSIS — R7303 Prediabetes: Secondary | ICD-10-CM | POA: Diagnosis not present

## 2018-05-14 DIAGNOSIS — C7951 Secondary malignant neoplasm of bone: Secondary | ICD-10-CM

## 2018-05-14 DIAGNOSIS — I428 Other cardiomyopathies: Secondary | ICD-10-CM

## 2018-05-14 NOTE — Patient Instructions (Addendum)
Medication Instructions:  Your physician recommends that you continue on your current medications as directed. Please refer to the Current Medication list given to you today.  If you need a refill on your cardiac medications before your next appointment, please call your pharmacy.   Lab work: NONE   If you have labs (blood work) drawn today and your tests are completely normal, you will receive your results only by: [name] Kitchen MyChart Message (if you have MyChart) OR . A paper copy in the mail If you have any lab test that is abnormal or we need to change your treatment, we will call you to review the results.  Testing/Procedures: NONE   Follow-Up: Your physician recommends that you schedule a follow-up appointment in 1 YEAR  Any Other Special Instructions Will Be Listed Below (If Applicable).

## 2018-05-14 NOTE — Telephone Encounter (Signed)
Attempted to call for post procedure follow-up. Unable to leave a message. 

## 2018-05-14 NOTE — Progress Notes (Signed)
Cardiology Office Note    Date:  05/14/2018   ID:  Logan Brown, DOB Dec 07, 1931, MRN [id number]  PCP:  [name], MD  Cardiologist: Dr. Percival Spanish  Chief Complaint  Patient presents with  . Follow-up    seen for Dr. Percival Spanish.     History of Present Illness:  Logan Brown is a 85 y.o. male with PMH of atrial fibrillation, prostate cancer with metastasis to the bone, hyperlipidemia, prediabetes and history of cardiomyopathy (likely NICM).  Based on the previous records obtained by Dr. Percival Spanish, it appears patient had a previous EF of 25%.  Low EF was in the setting of atrial flutter.  Based on previous office note by Dr. Maricela Bo of [facility].  EF eventually improved to 55% on repeat echocardiogram.  He was also placed on amiodarone as well.  He did not have a cardiac catheterization for ischemic work-up.  It was presumed his LV dysfunction is due to nonischemic cardiomyopathy in the setting of atrial fibrillation or atrial flutter.  His last office visit with Dr. Percival Spanish was on 03/27/2017.  He was stable at the time.  Unfortunately, he has very aggressive metastatic prostate cancer and has been followed by [facility] and Dr. Alen Blew of oncology service.  He maintains residence in both in [place] and in [place].   Patient presented for steroid lumbar injection yesterday by Dr. Dossie Arbour, after the procedure, as soon as the patient set up, he developed significant dizziness, sudden drop in the heart rate and a flushing sensation.  I suspect the episode was triggered by the procedure in relation to the changing heart rate.  I suspect it is a vasovagal episode.  At this time, I do not recommend further evaluation for the dizzy spell.  He does complain of worsening dyspnea at home at baseline.  I recommend echocardiogram however patient is flying back to [place] on March 11, this can be obtained in [place] by his cardiologist over  there.  As far as his atrial fibrillation, for some reason he has came off of amiodarone therapy this past year however patient cannot give me a good history why the amiodarone was discontinued.  Based on EKG yesterday, he is maintaining sinus rhythm on the labetalol and aspirin.  He is not on systemic anticoagulation due to high risk of bleeding.  I discussed the case with Dr. Sallyanne Kuster, at this time we do not recommend a trial of Eliquis either.     Past Medical History:  Diagnosis Date  . Arthritis   . Atrial fibrillation ([place])   . Bone cancer ([place])   . Cardiomyopathy ([place])   . Hyperlipidemia   . Irregular heartbeat   . Prostate CA ([place])    1997  . Prostate cancer ([place])   . Unstable gait 04/16/2018   unstable gait in home    Past Surgical History:  Procedure Laterality Date  . CATARACT EXTRACTION    . EYE SURGERY Bilateral    IOL/CE on Lt in 1998 and Rt in 2011.  [name] Kitchen PROSTATE SURGERY    . WRIST FOREIGN BODY REMOVAL     1957 glass    Current Medications: Outpatient Medications Prior to Visit  Medication Sig Dispense Refill  . aspirin EC 81 MG tablet Take 81 mg by mouth daily.    . busPIRone (BUSPAR) 10 MG tablet Take 10 mg by mouth. Takes 1/2 to 1 tablet 3 times daily with meals for nerves and anxiety.    . Cholecalciferol (  VITAMIN D3) 25 MCG (1000 UT) CAPS Vitamin D3 1,000 unit capsule  Take by oral route.    . furosemide (LASIX) 40 MG tablet Take 1 tablet daily as needed for fluid retention & swelling 90 tablet 1  . gabapentin (NEURONTIN) 600 MG tablet Take 1/2 to 1 tablet 2 to 3 x /day for chronic pain 90 tablet 2  . hydrOXYzine (ATARAX/VISTARIL) 25 MG tablet Take 1 tablet (25 mg total) by mouth every 6 (six) hours as needed for itching. 30 tablet 1  . labetalol (NORMODYNE) 100 MG tablet Take 1 whole tablet 2 x /day - morning & night 180 tablet 1  . losartan (COZAAR) 100 MG tablet Take 1 tablet daily for BP ((Replaces Lisinopril)) 90 tablet 1  . Multiple Vitamins-Minerals  (CENTRUM SILVER ADULT 50+ PO) Take by mouth.    [name] Kitchen omeprazole (PRILOSEC) 20 MG capsule Take 1 capsule (20 mg total) by mouth daily. 90 capsule 1  . oxyCODONE (OXY IR/ROXICODONE) 5 MG immediate release tablet Take 1 tablet (5 mg total) by mouth every 6 (six) hours as needed for up to 30 days for severe pain. Must last 30 days. 120 tablet 0  . oxycodone-acetaminophen (PERCOCET) 2.5-325 MG tablet Take 1 tablet by mouth every 6 (six) hours as needed for up to 30 days for pain. Must last 30 days 120 tablet 0   No facility-administered medications prior to visit.      Allergies:   Ace inhibitors and Hydrocodone   Social History   Socioeconomic History  . Marital status: Single    Spouse name: Not on file  . Number of children: Not on file  . Years of education: Not on file  . Highest education level: Not on file  Occupational History  . Not on file  Social Needs  . Financial resource strain: Not on file  . Food insecurity:    Worry: Not on file    Inability: Not on file  . Transportation needs:    Medical: Not on file    Non-medical: Not on file  Tobacco Use  . Smoking status: Former Smoker    Last attempt to quit: 03/14/1975    Years since quitting: 43.1  . Smokeless tobacco: Never Used  Substance and Sexual Activity  . Alcohol use: No    Alcohol/week: 0.0 standard drinks    Comment: occ  . Drug use: No  . Sexual activity: Not on file  Lifestyle  . Physical activity:    Days per week: Not on file    Minutes per session: Not on file  . Stress: Not on file  Relationships  . Social connections:    Talks on phone: Not on file    Gets together: Not on file    Attends religious service: Not on file    Active member of club or organization: Not on file    Attends meetings of clubs or organizations: Not on file    Relationship status: Not on file  Other Topics Concern  . Not on file  Social History Narrative  . Not on file     Family History:  The patient's family history is  not on file.   ROS:   Please see the history of present illness.    ROS All other systems reviewed and are negative.   PHYSICAL EXAM:   VS:  BP (!) 148/83   Pulse 70   Ht 5\' 7"  (1.702 m)   Wt 182 lb (82.6 kg)   BMI  28.51 kg/m    GEN: Well nourished, well developed, in no acute distress  HEENT: normal  Neck: no JVD, carotid bruits, or masses Cardiac: RRR; no murmurs, rubs, or gallops,no edema  Respiratory:  clear to auscultation bilaterally, normal work of breathing GI: soft, nontender, nondistended, + BS MS: no deformity or atrophy  Skin: warm and dry, no rash Neuro:  Alert and Oriented x 3, Strength and sensation are intact Psych: euthymic mood, full affect  Wt Readings from Last 3 Encounters:  05/14/18 182 lb (82.6 kg)  05/13/18 183 lb (83 kg)  05/05/18 183 lb (83 kg)      Studies/Labs Reviewed:   EKG:  EKG is ordered today.  The ekg ordered today demonstrates normal sinus rhythm  Recent Labs: 06/01/2017: TSH 2.55 03/23/2018: Magnesium 2.3 05/12/2018: ALT 20; BUN 22; Creatinine, Ser 1.55; Hemoglobin 12.1; Platelet Count 145; Potassium 4.7; Sodium 143   Lipid Panel    Component Value Date/Time   CHOL 203 (H) 06/01/2017 1544   TRIG 257 (H) 06/01/2017 1544   HDL 62 06/01/2017 1544   CHOLHDL 3.3 06/01/2017 1544   VLDL NOT CALC 03/01/2015 1742   LDLCALC 104 (H) 06/01/2017 1544    Additional studies/ records that were reviewed today include:   N/A   ASSESSMENT:    1. Dizziness   2. PAF (paroxysmal atrial fibrillation) ([place])   3. Prostate cancer metastatic to bone ([place])   4. Prediabetes   5. Hyperlipidemia, unspecified hyperlipidemia type   6. NICM (nonischemic cardiomyopathy) ([place])      PLAN:  In order of problems listed above:  1. Dizziness: I suspect the episode of dizziness yesterday was triggered by the back injection and a vasovagal episode.  At this time I do not recommend any further work-up.  EKG yesterday showed sinus rhythm  2. PAF: Not on  systemic anticoagulation due to high risk of bleeding.  History of hematuria on the Xarelto.  Continue labetalol and aspirin  3. Prediabetes: Managed by primary care provider  4. Nonischemic cardiomyopathy: He continued to have some shortness of breath with exertion at baseline, I recommended him to have a repeat echocardiogram once he gets back to [place]  5. Hyperlipidemia: He is currently not on statin.  I did not start him on statin therapy as this is unlikely will improve his quality of life.  6. Metastatic prostate cancer to the bone: Followed by oncology    Medication Adjustments/Labs and Tests Ordered: Current medicines are reviewed at length with the patient today.  Concerns regarding medicines are outlined above.  Medication changes, Labs and Tests ordered today are listed in the Patient Instructions below. Patient Instructions  Medication Instructions:  Your physician recommends that you continue on your current medications as directed. Please refer to the Current Medication list given to you today.  If you need a refill on your cardiac medications before your next appointment, please call your pharmacy.   Lab work: NONE   If you have labs (blood work) drawn today and your tests are completely normal, you will receive your results only by: [name] Kitchen MyChart Message (if you have MyChart) OR . A paper copy in the mail If you have any lab test that is abnormal or we need to change your treatment, we will call you to review the results.  Testing/Procedures: NONE   Follow-Up:   Any Other Special Instructions Will Be Listed Below (If Applicable).     Hilbert Corrigan, [place]  05/14/2018 2:17 PM  [facility] [place], [place], [place]  [address] Phone: [phone]; Fax: [phone]

## 2018-05-17 ENCOUNTER — Ambulatory Visit: Payer: Medicare Other | Attending: Pain Medicine | Admitting: Physical Therapy

## 2018-05-18 ENCOUNTER — Encounter: Payer: Medicare Other | Admitting: Physical Therapy

## 2018-05-20 ENCOUNTER — Encounter: Payer: Medicare Other | Admitting: Physical Therapy

## 2018-05-25 ENCOUNTER — Encounter: Payer: Medicare Other | Admitting: Physical Therapy

## 2018-05-26 DIAGNOSIS — I129 Hypertensive chronic kidney disease with stage 1 through stage 4 chronic kidney disease, or unspecified chronic kidney disease: Secondary | ICD-10-CM | POA: Diagnosis not present

## 2018-05-26 DIAGNOSIS — I119 Hypertensive heart disease without heart failure: Secondary | ICD-10-CM | POA: Diagnosis not present

## 2018-05-26 DIAGNOSIS — G893 Neoplasm related pain (acute) (chronic): Secondary | ICD-10-CM | POA: Diagnosis not present

## 2018-05-26 DIAGNOSIS — C7951 Secondary malignant neoplasm of bone: Secondary | ICD-10-CM | POA: Diagnosis not present

## 2018-05-27 ENCOUNTER — Encounter: Payer: Medicare Other | Admitting: Physical Therapy

## 2018-05-28 ENCOUNTER — Ambulatory Visit: Admitting: Urology

## 2018-05-28 ENCOUNTER — Ambulatory Visit

## 2018-05-28 NOTE — Progress Notes (Signed)
* * *      **  [name]**    ------    63 Y old Male, DOB: 02/06/1932    [address] , [place], [place] [address]    Home: [phone]    Provider: [name]        * * *    Telephone Encounter    ---    Answered by  [name] Date: 05/28/2018       Time: 02:10 PM    Caller  daughter, [name]    ------            Reason  Dtr to call back on Monday            Message                     Per daughter Pt is  back from [facility] and needs Lupron [phone]                Action Taken                     [name],[name]  05/28/2018 2:10:51 PM >      [name],[name]  05/28/2018 2:26:17 PM > when do you want him in?      Obaloluwa Delatte E 05/28/2018 2:27:56 PM > we need to see if he got a Lupron injection down in [place] and if not then I should see him sooner than later but that being said given this environment he should get a PSA first and if OK he can wait to see me for another month or two if high then I may need him to come in.. can we send them or put the order inot [facility] ( see virtual visit) and have him get the PSA done enxt week?      [name],[name]  05/28/2018 2:31:18 PM > patients daughter will call back on monday to advise when shot was given in [place]                    * * *              * * *        ---      Reason for Appointment    ---     1\. Dtr to call back on Monday    ---     Assessments    ---    1\. Prostate cancer - C61 (Primary)    ---     Treatment    ---      **1\. Prostate cancer**    _LAB: PSA Diagnostic_    ---          * * *          Patient: [name], [name] DOB: 04/24/1931 Provider: [name]  05/28/2018    ---    Note generated by eClinicalWorks EMR/PM Software ([email or web address])

## 2018-06-01 ENCOUNTER — Encounter: Payer: Medicare Other | Admitting: Physical Therapy

## 2018-06-03 ENCOUNTER — Encounter: Payer: Medicare Other | Admitting: Physical Therapy

## 2018-06-04 ENCOUNTER — Ambulatory Visit: Admitting: Urology

## 2018-06-04 NOTE — Progress Notes (Signed)
* * *      **  [name]**    ------    36 Y old Male, DOB: 09/14/1931    [address] , [place], [place] [address]    Home: [phone]    Provider: [name]        * * *    Telephone Encounter    ---    Answered by  Wendelyn Breslow Date: 06/04/2018       Time: 03:12 PM    Caller  pt    ------            Reason  appt for Lupron            Message                     lupron injection  [phone]                Action Taken                     Morse,Linda  06/04/2018 3:13:14 PM >      [name],[name]  06/07/2018 2:30:29 PM > appointment given                    * * *                ---          * * *         Patient: [name] DOB: 20-Apr-1931 Provider: [name]  06/04/2018    ---    Note generated by eClinicalWorks EMR/PM Software ([email or web address])

## 2018-06-07 ENCOUNTER — Telehealth: Payer: Self-pay | Admitting: *Deleted

## 2018-06-07 ENCOUNTER — Ambulatory Visit: Payer: Medicare Other | Admitting: Pain Medicine

## 2018-06-07 NOTE — Telephone Encounter (Signed)
Patient called to cancel his April & May appointments for Labs and [place].  He states he is currently in [place] and plans to be for several months.  Routing as FYI to provider.

## 2018-06-08 ENCOUNTER — Encounter: Payer: Medicare Other | Admitting: Physical Therapy

## 2018-06-09 ENCOUNTER — Inpatient Hospital Stay: Payer: Medicare Other

## 2018-06-10 ENCOUNTER — Ambulatory Visit: Admitting: Family

## 2018-06-10 DIAGNOSIS — C7951 Secondary malignant neoplasm of bone: Secondary | ICD-10-CM | POA: Diagnosis not present

## 2018-06-10 NOTE — Other (Addendum)
 Patient:   [name], [name]            MRN: [id number]            FIN: [id number]               Age:   85 years     Sex:  Male     DOB:  1931/04/03   Associated Diagnoses:   None   Author:   Lorraine Lax NP, [place] Citizen      Visit Information   Patient is seen under the supervision of Dr. Kem Kays.      Chief Complaint   metastatic prostate cancer    Oncologic/hematologic History:  He was recently admitted to the hospital s/p fall down the stairs, resulting in a back injury.  Extensive work-up including CT head, CT spine and MRI spine was done.  It revealed evidence of metastatic disease to the back and a mild compression fracture of L2.  There was no evidence of spinal cord compression.  PSA was checked and it returned high at 245.  He was treated conservatively for his fracture and was started on pain meds.  He was then started on Lupron and Casodex, as well as Xgeva. Casodex was then discontinued and he remained on the other two. For his bony met in the back, he underwent kyphoplasty. Repeat PSA on 10/29/17 was 0.1. He then went to [facility] for the winter months.          Interval History   Since his last visit here the patient has returned form [place] and is ready to resume treatment back a [facility]. The patient states he is feeling fairly well. He does endorse ongoing lower back pain but denies any new pains. He continues to urinate frequently. He states he is eating and drinking OK. Denies fevers, chills, headache, shortness of breath.       Review of Systems   Constitutional:  Negative.    Eye:  Negative.    Ear/Nose/Mouth/Throat:  Negative.    Respiratory:  Negative.    Cardiovascular:  Negative.    Gastrointestinal:  Negative.    Genitourinary:  Negative.    Hematology/Lymphatics:  Negative.    Endocrine:  Negative.    Immunologic:  Negative.    Musculoskeletal:  Negative.    Integumentary:  Negative.    Neurologic:  Alert and oriented X4.    Psychiatric:  Negative.    All other systems are negative      Health Status    Allergies:    Allergic Reactions (Selected)  NKA,    Allergies (1) Active Reaction  NKA None Documented     Problem list:    Nursing  Resolved: Abdominal pain / 40347425  Resolved: Alteration in comfort: pain / 95638756  All Problems  Anxiety / 4332951884 / Confirmed  Atrial flutter / 1660630 / Confirmed  Back pain / 160109323 / Confirmed  SOB - Shortness of breath / 557322025 / Confirmed  Fracture of lumbar vertebrae / 427062376 / Confirmed  GERD - Gastro-esophageal reflux disease / 2831517616 / Confirmed  Hematoma / 0737106269 / Confirmed  Hyperlipidemia / 48546270 / Confirmed  HTN / 3500938182 / Confirmed  Arthritis / 9937169678 / Confirmed  Prostate cancer / 9381017510 / Confirmed  Metastasis of malignant neoplasm to bone / 258527782 / Confirmed  Resolved: Abdominal pain / 42353614  Resolved: Alteration in comfort: pain / 43154008,    Active Problems (12)  Anxiety   Arthritis   Atrial flutter  Back pain   Fracture of lumbar vertebrae   GERD - Gastro-esophageal reflux disease   Hematoma   HTN   Hyperlipidemia   Metastasis of malignant neoplasm to bone   Prostate cancer   SOB - Shortness of breath         Histories   Past Medical History:    No active or resolved past medical history items have been selected or recorded.   Family History:    High blood pressure  Daughter  Daughter  Kidney transplant  Daughter     Procedure history:    repair wrist laceration.   Social History        Social & Psychosocial Habits    Home/Environment  08/14/2017  Lives with: Children    Comment: lives with daughter and brother has 7 children - 08/14/2017 11:49 - Margaretha Glassing RN, [name]    Nutrition  08/14/2017  Type of diet: Regular    Substance Use  10/19/2015  Use: Never    Tobacco  10/19/2015  Use: Former smoker    Alcohol  10/19/2015  Use: None    Employment/School  08/14/2017  Status: Retired  .        Physical Examination   Measurements from flowsheet : Measurements   06/09/2018 13:55  Type of Height Stated height    Type of Weight  Stated weight      General:  Alert and oriented, No acute distress.    Neurologic:  Alert, Oriented.    Cognition and Speech:  Oriented, Speech clear and coherent.    Psychiatric:  Cooperative, Appropriate mood & affect.       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     Imaging thus far as well as the high PSA which indicates the diagnosis of stage IV prostate cancer.  He was recommended to have staging scans, CT chest/abdomen/pelvis and bone scan but he canceled these secondary to the cost.  He was started on bicalutamide and then received his first dose of Lupron as few days later.  He  also started [facility] for his bony mets. It is a monthly dose. He tolerated both these injections well. He does have hot flashes from the Lupron. He will continue. Encouraged him to take Ca/Vit D suppl daily. Will check bone density next year.     The bicalutamide was discontinued and patient will be maintained on Lupron alone until progression.      For his pain, he will continue on fentanyl patch and oxycodone as needed.  He was referred to [facility] to see if there was a role for palliative XRT. He ultimately went for kyphoplasty to his spine.     He has been in [place] under the care of Dr. Oneta Rack. He has now return to [facility]. He will get his Lupron shot next week in Dr. Stevenson Clinch office. I will arrange for his Xgeva in [place]. I will obtain a PSA with his other labs.      Patient is agreeable to plan.  He will return for follow-up in 2 months.  He knows to call with questions/concerns anytime.                          Reviewed and electronically verified by:  Lorraine Lax NP, [place] Citizen  on:  06/10/2018 14:35    Reviewed and electronically authenticated by:  Rosita Fire                                                                          on:  06/10/18 14:35

## 2018-06-10 NOTE — Other (Deleted)
 [email or web address] Intake Entered On:  06/09/2018 13:56     Performed On:  06/09/2018 13:55  by Alfred Levins               Vital Signs   Pain Scale Used :   0-10   Alfred Levins - 06/09/2018 13:55    Height/Weight   Type of Weight :   Stated weight   Type of Height :   Stated height   Alfred Levins - 06/09/2018 13:55    [facility] General Information/Subjective   Chief Complaint :   telephone encounter   High Risk for Falls :   No   Preferred Language :   English   Preferred Communication Mode :   Verbal   Pain Symptoms :   No   Alfred Levins - 06/09/2018 13:55    Patient Preferred Method of Communication   Phone Call   [facility] Infectious Disease Risk Screening   Patient Travel History :   No recent travel   Recent Family Travel History :   No recent travel   Close contact w/ suspect case COVID-19 :   No   Close contact w/ lab-confirmed case COVID-19 :   No   Alfred Levins - 06/09/2018 13:55    Allergy   (As Of: 06/09/2018 13:56:37 )   Allergies (Active)   NKA  Estimated Onset Date:   Unspecified ; Created By:   Ronnie Doss; Reaction Status:   Active ; Category:   Drug ; Substance:   NKA ; Type:   Allergy ; Updated By:   Ronnie Doss; Reviewed Date:   06/09/2018 13:56         Medication History   Medication List   (As Of: 06/09/2018 13:56:37 )   Prescription/Discharge Order    potassium phosphate-sodium phosphate  :   potassium phosphate-sodium phosphate ; Status:   Prescribed ; Ordered As Mnemonic:   Phospha 250 Neutral oral tablet ; Simple Display Line:   See Instructions, 1 tab twice a day for 3 days, 6 tab(s), 0 Refill(s) ; Ordering Provider:   Lorraine Lax NP, [place] Citizen; Catalog Code:   potassium phosphate-sodium phosphate ; Order Dt/Tm:   12/09/2017 16:32:48             Home Meds    calcium carbonate  :   calcium carbonate ; Status:   Documented ; Ordered As Mnemonic:   calcium (as carbonate) 600 mg oral tablet ; Simple Display Line:   600 mg, 1 tab(s), PO, Daily, 0 Refill(s) ; Catalog Code:   calcium carbonate  ; Order Dt/Tm:   10/02/2017 17:18:41           oxyCODONE  :   oxyCODONE ; Status:   Documented ; Ordered As Mnemonic:   oxyCODONE ; Simple Display Line:   5 mg, PO, as needed, 0 Refill(s) ; Catalog Code:   oxyCODONE ; Order Dt/Tm:   09/29/2017 15:24:05           cholecalciferol  :   cholecalciferol ; Status:   Documented ; Ordered As Mnemonic:   Vitamin D3 ; Simple Display Line:   1,000 Int_Unit, PO, Daily, 0 Refill(s) ; Catalog Code:   cholecalciferol ; Order Dt/Tm:   09/04/2017 14:19:46           codeine-guaifenesin  :   codeine-guaifenesin ; Status:   Documented ; Ordered As Mnemonic:   Robitussin AC ; Simple Display Line:   10 mL, PO, q4hr, 10ml twice a day as needed,  0 Refill(s) ; Catalog Code:   codeine-guaifenesin ; Order Dt/Tm:   08/14/2017 11:47:36           polyethylene glycol 3350  :   polyethylene glycol 3350 ; Status:   Documented ; Ordered As Mnemonic:   MiraLax oral powder for reconstitution ; Simple Display Line:   17 Gm, PO, Daily, PRN: Constipation, 0 Refill(s) ; Catalog Code:   polyethylene glycol 3350 ; Order Dt/Tm:   08/14/2017 11:47:11           ondansetron  :   ondansetron ; Status:   Documented ; Ordered As Mnemonic:   Zofran 4 mg oral tablet ; Simple Display Line:   See Instructions, 1 tab(s) PO q8hr PRN, 0 Refill(s) ; Catalog Code:   ondansetron ; Order Dt/Tm:   08/14/2017 11:46:16           omeprazole  :   omeprazole ; Status:   Documented ; Ordered As Mnemonic:   omeprazole ; Simple Display Line:   20 mg, PO, Daily, 0 Refill(s) ; Catalog Code:   omeprazole ; Order Dt/Tm:   08/14/2017 11:45:57           fentaNYL  :   fentaNYL ; Status:   Documented ; Ordered As Mnemonic:   fentaNYL ; Simple Display Line:   25 mcg, q72hr, change q 3 days fentayl 25 mgPartial fill upon request, 0 Refill(s) ; Catalog Code:   fentaNYL ; Order Dt/Tm:   08/14/2017 11:39:32           loperamide  :   loperamide ; Status:   Documented ; Ordered As Mnemonic:   loperamide 2 mg oral tablet ; Simple Display Line:   2  mg, 1 tab(s), PO, q6hr, as needed, 0 Refill(s) ; Catalog Code:   loperamide ; Order Dt/Tm:   08/14/2017 11:38:26           acetaminophen  :   acetaminophen ; Status:   Documented ; Ordered As Mnemonic:   acetaminophen 325 mg oral tablet ; Simple Display Line:   650 mg, 2 tab(s), PO, q4hr, PRN: Fever/Pain, Mild to Moderate, 0 Refill(s) ; Ordering Provider:   Susette Racer MD, Collene Leyden; Catalog Code:   acetaminophen ; Order Dt/Tm:   07/22/2017 08:43:08           Al hydroxide/Mg hydroxide/simethicone  :   Al hydroxide/Mg hydroxide/simethicone ; Status:   Documented ; Ordered As Mnemonic:   aluminum hydroxide/magnesium hydroxide/simethicone 200 mg-200 mg-20 mg/5 mL oral suspension ; Simple Display Line:   30 mL, PO, q4hr, PRN: Dyspepsia, 0 Refill(s) ; Ordering Provider:   Susette Racer MD, Collene Leyden; Catalog Code:   Al hydroxide/Mg hydroxide/simethicone ; Order Dt/Tm:   07/22/2017 08:43:18           docusate  :   docusate ; Status:   Documented ; Ordered As Mnemonic:   docusate sodium 100 mg oral capsule ; Simple Display Line:   100 mg, 1 cap(s), PO, BID, PRN: Constipation, 0 Refill(s) ; Ordering Provider:   Susette Racer MD, Collene Leyden; Catalog Code:   docusate ; Order Dt/Tm:   07/22/2017 08:43:25           lisinopril  :   lisinopril ; Status:   Documented ; Ordered As Mnemonic:   lisinopril 5 mg oral tablet ; Simple Display Line:   5 mg, 1 tab(s), PO, Daily, 0 Refill(s) ; Ordering Provider:   Susette Racer MD, Collene Leyden; Catalog Code:   lisinopril ; Order Dt/Tm:  07/22/2017 08:43:40           aspirin  :   aspirin ; Status:   Documented ; Ordered As Mnemonic:   aspirin ; Simple Display Line:   81 mg, Chewed, Daily, 0 Refill(s) ; Catalog Code:   aspirin ; Order Dt/Tm:   07/18/2017 08:40:53           gabapentin  :   gabapentin ; Status:   Documented ; Ordered As Mnemonic:   gabapentin 300 mg oral capsule ; Simple Display Line:   300 mg, 1 cap(s), PO, BID, 0 Refill(s) ; Catalog Code:   gabapentin ; Order Dt/Tm:   07/12/2017 18:01:00            ALPRAZolam  :   ALPRAZolam ; Status:   Documented ; Ordered As Mnemonic:   Xanax ; Simple Display Line:   See Instructions, 0.25 mg PO TID PRN, PRN: as needed for anxiety, 0 Refill(s) ; Catalog Code:   ALPRAZolam ; Order Dt/Tm:   09/07/2015 07:07:23             [facility] Social History   Cigarette Smoking Last 365 Days :   No   Exposure to Tobacco Smoke :   Former smoker   Patient used other tobacco products in the last 30 days? :   No   Alfred Levins - 06/09/2018 13:55    Social History   (As Of: 06/09/2018 13:56:37 )   Tobacco:        Former smoker   (Last Updated: 10/19/2015 07:51:16  by Konrad Penta RN, [place] Bridge)          Alcohol:        None   (Last Updated: 10/19/2015 07:51:23  by Konrad Penta RN, Johnny Bridge)          Substance Use:        Never   (Last Updated: 10/19/2015 07:51:50  by Konrad Penta RN, [place] Bridge)          Home/Environment:        Lives with Children.   Comments:  08/14/2017 11:49 - Margaretha Glassing RN, [name]: lives with daughter and brother has 7 children   (Last Updated: 08/14/2017 11:49:19  by Margaretha Glassing RN, [name])          Nutrition:        Regular   (Last Updated: 08/14/2017 11:41:02  by Margaretha Glassing RN, [name])          Employment/School:        Retired   (Last Updated: 08/14/2017 11:40:49  by Margaretha Glassing RN, [name])            Advance Directive   Advanced Directives :   No       Alfred Levins - 06/09/2018 13:57      [facility] Encounter   Onc Type of Patient :   Outpatient Visit Existing   Onc Visit Level, Existing :   No charge   (Comment: telephone encounter Alfred Levins - 06/09/2018 13:55 ] )   Alfred Levins - 06/09/2018 13:55

## 2018-06-10 NOTE — Other (Addendum)
 [facility] Intake Entered On:  06/09/2018 13:56     Performed On:  06/09/2018 13:55  by Alfred Levins               Vital Signs   Pain Scale Used :   0-10   Alfred Levins - 06/09/2018 13:55    Height/Weight   Type of Weight :   Stated weight   Type of Height :   Stated height   Alfred Levins - 06/09/2018 13:55    [facility] General Information/Subjective   Chief Complaint :   telephone encounter   High Risk for Falls :   No   Preferred Language :   English   Preferred Communication Mode :   Verbal   Pain Symptoms :   No   Alfred Levins - 06/09/2018 13:55    Patient Preferred Method of Communication   Phone Call   [facility] Infectious Disease Risk Screening   Patient Travel History :   No recent travel   Recent Family Travel History :   No recent travel   Close contact w/ suspect case COVID-19 :   No   Close contact w/ lab-confirmed case COVID-19 :   No   Alfred Levins - 06/09/2018 13:55    Allergy   (As Of: 06/09/2018 13:56:37 )   Allergies (Active)   NKA  Estimated Onset Date:   Unspecified ; Created By:   Ronnie Doss; Reaction Status:   Active ; Category:   Drug ; Substance:   NKA ; Type:   Allergy ; Updated By:   Ronnie Doss; Reviewed Date:   06/09/2018 13:56         Medication History   Medication List   (As Of: 06/09/2018 13:56:37 )   Prescription/Discharge Order    potassium phosphate-sodium phosphate  :   potassium phosphate-sodium phosphate ; Status:   Prescribed ; Ordered As Mnemonic:   Phospha 250 Neutral oral tablet ; Simple Display Line:   See Instructions, 1 tab twice a day for 3 days, 6 tab(s), 0 Refill(s) ; Ordering Provider:   Lorraine Lax NP, [place] Citizen; Catalog Code:   potassium phosphate-sodium phosphate ; Order Dt/Tm:   12/09/2017 16:32:48             Home Meds    calcium carbonate  :   calcium carbonate ; Status:   Documented ; Ordered As Mnemonic:   calcium (as carbonate) 600 mg oral tablet ; Simple Display Line:   600 mg, 1 tab(s), PO, Daily, 0 Refill(s) ; Catalog Code:   calcium carbonate ; Order  Dt/Tm:   10/02/2017 17:18:41           oxyCODONE  :   oxyCODONE ; Status:   Documented ; Ordered As Mnemonic:   oxyCODONE ; Simple Display Line:   5 mg, PO, as needed, 0 Refill(s) ; Catalog Code:   oxyCODONE ; Order Dt/Tm:   09/29/2017 15:24:05           cholecalciferol  :   cholecalciferol ; Status:   Documented ; Ordered As Mnemonic:   Vitamin D3 ; Simple Display Line:   1,000 Int_Unit, PO, Daily, 0 Refill(s) ; Catalog Code:   cholecalciferol ; Order Dt/Tm:   09/04/2017 14:19:46           codeine-guaifenesin  :   codeine-guaifenesin ; Status:   Documented ; Ordered As Mnemonic:   Robitussin AC ; Simple Display Line:   10 mL, PO, q4hr, 10ml twice a day as needed,  0 Refill(s) ; Catalog Code:   codeine-guaifenesin ; Order Dt/Tm:   08/14/2017 11:47:36           polyethylene glycol 3350  :   polyethylene glycol 3350 ; Status:   Documented ; Ordered As Mnemonic:   MiraLax oral powder for reconstitution ; Simple Display Line:   17 Gm, PO, Daily, PRN: Constipation, 0 Refill(s) ; Catalog Code:   polyethylene glycol 3350 ; Order Dt/Tm:   08/14/2017 11:47:11           ondansetron  :   ondansetron ; Status:   Documented ; Ordered As Mnemonic:   Zofran 4 mg oral tablet ; Simple Display Line:   See Instructions, 1 tab(s) PO q8hr PRN, 0 Refill(s) ; Catalog Code:   ondansetron ; Order Dt/Tm:   08/14/2017 11:46:16           omeprazole  :   omeprazole ; Status:   Documented ; Ordered As Mnemonic:   omeprazole ; Simple Display Line:   20 mg, PO, Daily, 0 Refill(s) ; Catalog Code:   omeprazole ; Order Dt/Tm:   08/14/2017 11:45:57           fentaNYL  :   fentaNYL ; Status:   Documented ; Ordered As Mnemonic:   fentaNYL ; Simple Display Line:   25 mcg, q72hr, change q 3 days fentayl 25 mgPartial fill upon request, 0 Refill(s) ; Catalog Code:   fentaNYL ; Order Dt/Tm:   08/14/2017 11:39:32           loperamide  :   loperamide ; Status:   Documented ; Ordered As Mnemonic:   loperamide 2 mg oral tablet ; Simple Display Line:   2 mg, 1  tab(s), PO, q6hr, as needed, 0 Refill(s) ; Catalog Code:   loperamide ; Order Dt/Tm:   08/14/2017 11:38:26           acetaminophen  :   acetaminophen ; Status:   Documented ; Ordered As Mnemonic:   acetaminophen 325 mg oral tablet ; Simple Display Line:   650 mg, 2 tab(s), PO, q4hr, PRN: Fever/Pain, Mild to Moderate, 0 Refill(s) ; Ordering Provider:   Susette Racer MD, Collene Leyden; Catalog Code:   acetaminophen ; Order Dt/Tm:   07/22/2017 08:43:08           Al hydroxide/Mg hydroxide/simethicone  :   Al hydroxide/Mg hydroxide/simethicone ; Status:   Documented ; Ordered As Mnemonic:   aluminum hydroxide/magnesium hydroxide/simethicone 200 mg-200 mg-20 mg/5 mL oral suspension ; Simple Display Line:   30 mL, PO, q4hr, PRN: Dyspepsia, 0 Refill(s) ; Ordering Provider:   Susette Racer MD, Collene Leyden; Catalog Code:   Al hydroxide/Mg hydroxide/simethicone ; Order Dt/Tm:   07/22/2017 08:43:18           docusate  :   docusate ; Status:   Documented ; Ordered As Mnemonic:   docusate sodium 100 mg oral capsule ; Simple Display Line:   100 mg, 1 cap(s), PO, BID, PRN: Constipation, 0 Refill(s) ; Ordering Provider:   Susette Racer MD, Collene Leyden; Catalog Code:   docusate ; Order Dt/Tm:   07/22/2017 08:43:25           lisinopril  :   lisinopril ; Status:   Documented ; Ordered As Mnemonic:   lisinopril 5 mg oral tablet ; Simple Display Line:   5 mg, 1 tab(s), PO, Daily, 0 Refill(s) ; Ordering Provider:   Susette Racer MD, Collene Leyden; Catalog Code:   lisinopril ; Order Dt/Tm:  07/22/2017 08:43:40           aspirin  :   aspirin ; Status:   Documented ; Ordered As Mnemonic:   aspirin ; Simple Display Line:   81 mg, Chewed, Daily, 0 Refill(s) ; Catalog Code:   aspirin ; Order Dt/Tm:   07/18/2017 08:40:53           gabapentin  :   gabapentin ; Status:   Documented ; Ordered As Mnemonic:   gabapentin 300 mg oral capsule ; Simple Display Line:   300 mg, 1 cap(s), PO, BID, 0 Refill(s) ; Catalog Code:   gabapentin ; Order Dt/Tm:   07/12/2017 18:01:00           ALPRAZolam  :    ALPRAZolam ; Status:   Documented ; Ordered As Mnemonic:   Xanax ; Simple Display Line:   See Instructions, 0.25 mg PO TID PRN, PRN: as needed for anxiety, 0 Refill(s) ; Catalog Code:   ALPRAZolam ; Order Dt/Tm:   09/07/2015 07:07:23             [facility] Social History   Cigarette Smoking Last 365 Days :   No   Exposure to Tobacco Smoke :   Former smoker   Patient used other tobacco products in the last 30 days? :   No   Alfred Levins - 06/09/2018 13:55    Social History   (As Of: 06/09/2018 13:56:37 )   Tobacco:        Former smoker   (Last Updated: 10/19/2015 07:51:16  by Konrad Penta RN, Johnny Bridge)          Alcohol:        None   (Last Updated: 10/19/2015 07:51:23  by Konrad Penta RN, [place] Bridge)          Substance Use:        Never   (Last Updated: 10/19/2015 07:51:50  by Konrad Penta RN, Johnny Bridge)          Home/Environment:        Lives with Children.   Comments:  08/14/2017 11:49 - Margaretha Glassing RN, [name]: lives with daughter and brother has 7 children   (Last Updated: 08/14/2017 11:49:19  by Margaretha Glassing RN, [name])          Nutrition:        Regular   (Last Updated: 08/14/2017 11:41:02  by Margaretha Glassing RN, [name])          Employment/School:        Retired   (Last Updated: 08/14/2017 11:40:49  by Margaretha Glassing RN, [name])            Advance Directive   Advanced Directives :   No       Alfred Levins - 06/09/2018 13:57      [facility] Encounter   Onc Type of Patient :   Outpatient Visit Existing   Onc Visit Level, Existing :   No charge   (Comment: telephone encounter Alfred Levins - 06/09/2018 13:55 ] )   Alfred Levins - 06/09/2018 13:55

## 2018-06-10 NOTE — Other (Deleted)
 Patient:   [name], [name]            MRN: [id number]            FIN: [id number]               Age:   85 years     Sex:  Male     DOB:  [birth year]   Associated Diagnoses:   None   Author:   Lorraine Lax NP, Ray[place] Citizen      Visit Information   Patient is seen under the supervision of Dr. Kem Kays.      Chief Complaint   metastatic prostate cancer    Oncologic/hematologic History:  He was recently admitted to the hospital s/p fall down the stairs, resulting in a back injury.  Extensive work-up including CT head, CT spine and MRI spine was done.  It revealed evidence of metastatic disease to the back and a mild compression fracture of L2.  There was no evidence of spinal cord compression.  PSA was checked and it returned high at 245.  He was treated conservatively for his fracture and was started on pain meds.  He was then started on Lupron and Casodex, as well as Xgeva. Casodex was then discontinued and he remained on the other two. For his bony met in the back, he underwent kyphoplasty. Repeat PSA on 10/29/17 was 0.1. He then went to [facility] for the winter months.          Interval History   Since his last visit here the patient has returned form [place] and is ready to resume treatment back a [facility]. The patient states he is feeling fairly well. He does endorse ongoing lower back pain but denies any new pains. He continues to urinate frequently. He states he is eating and drinking OK. Denies fevers, chills, headache, shortness of breath.       Review of Systems   Constitutional:  Negative.    Eye:  Negative.    Ear/Nose/Mouth/Throat:  Negative.    Respiratory:  Negative.    Cardiovascular:  Negative.    Gastrointestinal:  Negative.    Genitourinary:  Negative.    Hematology/Lymphatics:  Negative.    Endocrine:  Negative.    Immunologic:  Negative.    Musculoskeletal:  Negative.    Integumentary:  Negative.    Neurologic:  Alert and oriented X4.    Psychiatric:  Negative.    All other systems are negative      Health Status    Allergies:    Allergic Reactions (Selected)  NKA,    Allergies (1) Active Reaction  NKA None Documented     Problem list:    Nursing  Resolved: Abdominal pain / [id number]  Resolved: Alteration in comfort: pain / 52841324  All Problems  Anxiety / 4010272536 / Confirmed  Atrial flutter / 6440347 / Confirmed  Back pain / 425956387 / Confirmed  SOB - Shortness of breath / 564332951 / Confirmed  Fracture of lumbar vertebrae / 884166063 / Confirmed  GERD - Gastro-esophageal reflux disease / 0160109323 / Confirmed  Hematoma / 5573220254 / Confirmed  Hyperlipidemia / 27062376 / Confirmed  HTN / 2831517616 / Confirmed  Arthritis / 0737106269 / Confirmed  Prostate cancer / 4854627035 / Confirmed  Metastasis of malignant neoplasm to bone / 009381829 / Confirmed  Resolved: Abdominal pain / 93716967  Resolved: Alteration in comfort: pain / 89381017,    Active Problems (12)  Anxiety   Arthritis   Atrial flutter  Back pain   Fracture of lumbar vertebrae   GERD - Gastro-esophageal reflux disease   Hematoma   HTN   Hyperlipidemia   Metastasis of malignant neoplasm to bone   Prostate cancer   SOB - Shortness of breath         Histories   Past Medical History:    No active or resolved past medical history items have been selected or recorded.   Family History:    High blood pressure  Daughter  Daughter  Kidney transplant  Daughter     Procedure history:    repair wrist laceration.   Social History        Social & Psychosocial Habits    Home/Environment  08/14/2017  Lives with: Children    Comment: lives with daughter and brother has 7 children - 08/14/2017 11:49 - Margaretha Glassing RN, [name]    Nutrition  08/14/2017  Type of diet: Regular    Substance Use  10/19/2015  Use: Never    Tobacco  10/19/2015  Use: Former smoker    Alcohol  10/19/2015  Use: None    Employment/School  08/14/2017  Status: Retired  .        Physical Examination   Measurements from flowsheet : Measurements   06/09/2018 13:55  Type of Height Stated height    Type of Weight  Stated weight      General:  Alert and oriented, No acute distress.    Neurologic:  Alert, Oriented.    Cognition and Speech:  Oriented, Speech clear and coherent.    Psychiatric:  Cooperative, Appropriate mood & affect.       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     Imaging thus far as well as the high PSA which indicates the diagnosis of stage IV prostate cancer.  He was recommended to have staging scans, CT chest/abdomen/pelvis and bone scan but he canceled these secondary to the cost.  He was started on bicalutamide and then received his first dose of Lupron as few days later.  He  also started [name] for his bony mets. It is a monthly dose. He tolerated both these injections well. He does have hot flashes from the Lupron. He will continue. Encouraged him to take Ca/Vit D suppl daily. Will check bone density next year.     The bicalutamide was discontinued and patient will be maintained on Lupron alone until progression.      For his pain, he will continue on fentanyl patch and oxycodone as needed.  He was referred to [facility] to see if there was a role for palliative XRT. He ultimately went for kyphoplasty to his spine.     He has been in [place] under the care of Dr. Oneta Rack. He has now return to [facility]. He will get his Lupron shot next week in Dr. Stevenson Clinch office. I will arrange for his Xgeva in [place]. I will obtain a PSA with his other labs.      Patient is agreeable to plan.  He will return for follow-up in 2 months.  He knows to call with questions/concerns anytime.                          Reviewed and electronically verified by:  Lorraine Lax NP, Carolinepar  on:  06/10/2018 14:35parpar Reviewed and electronically authenticated by:  Rosita Fire  par                                                                      on:  06/10/18 14:35parpar

## 2018-06-16 ENCOUNTER — Ambulatory Visit: Admitting: Urology

## 2018-06-16 NOTE — Progress Notes (Signed)
 * * *      **[name]**    ------    53 Y old Male, DOB: [name] 10, 1932    Account Number: [id number]    [address], [place], [place]    Home: [id number]    Guarantor: [name], [name] Insurance: [name] Medicare Complete Payer ID:  [id number]    PCP: [name], MD    Appointment Facility: [facility]        * * *    06/16/2018 Progress Notes: [name], [name]    ------    ---       **Current Medications**    ---    Unknown    * Omeprazole 20 MG Capsule Delayed Release 1 capsule Orally Once a day    ---    * Acetaminophen 500 MG Capsule 1 capsule as needed Orally every 6 hrs    ---    * Fentanyl 50 MCG/HR Patch 72 Hour 1 patch to skin Transdermal     ---    * Gabapentin 300 MG Capsule 1 capsule Orally Once a day    ---    * Lisinopril 5 MG Tablet 1 tablet Orally Once a day    ---    * Metoprolol Succinate ER 25 MG Tablet Extended Release 24 Hour 1 tablet Orally Once a day    ---    * MiraLax - Powder as directed Orally     ---    * Oxycodone HCl 5 MG/5ML Solution 5 ml as needed Orally every 6 hrs    ---    * Milk of Magnesia 400 MG/5ML Suspension 5 ml as needed Orally Four times a day    ---    * Bisacodyl 10 MG Suppository 1 suppository as needed Rectal Once a day    ---    * Aspirin 81 MG Tablet Delayed Release 1 tablet Orally Once a day    ---    * Calcium     ---    * Casodex 50 MG Tablet 1 tablet Orally Once a day    ---    * Centrum Silver Tablet as directed Orally     ---    * Hydrochlorothiazide 25 MG Tablet 1 tablet Orally Once a day    ---    * Xanax 0.25 MG Tablet 1 tablet Orally Twice a day    ---    * Hydrocodone-Acetaminophen 5-325 MG Tablet 1 tablet as needed Orally every 6 hrs    ---    * Amiodarone HCl 200 MG Tablet 1 tablet Orally Once a day    ---    * Zestril 5 MG Tablet 1 tablet Orally Once a day    ---    * Zocor 10 MG Tablet 1 tablet every evening Orally Once a day    ---    * Xarelto 15 MG Tablet Orally     ---     Past Medical History     ---      prostate cancer: Dx'd 7/06; S/P XRT: 11/06-1/07: gleason's 8 and then Dx'd  with met prostate cancer in 07/2017: Lupron on/off.        ---    GERD.        ---    Hypercholesterolemia.        ---    Hypertension.        ---    Anxiety.        ---  Atrial Fibrillation: @ 2017.        ---    ED - Impotence of organic origin.        ---    Renal insufficiency.        ---      **Surgical History**    ---      Trans Rectal [place] and Prostate BX 09/2004    ---    cholecystectomy 04/2004    ---    inguinal hernia repair    ---    cystoscopy: c/w rad changes 08/27/2016    ---    cardioversion    ---     **Family History**    ---      No Family History documented.    ---      **Social History**    ---    Alcohol: yes, occasional.    no Caffeine.    no Smoking status: never smoker, Patient counseled on the dangers of tobacco  use: 06/16/2018.     **Allergies**    ---      N.K.D.A.    ---      **Hospitalization/Major Diagnostic Procedure**    ---      fall/compressionfx c/we met disease 5/11-5/15/19    ---      **Review of Systems**    ---     _General_ :    no flank pain. no Fever. no Chills. no Weakness. no Abdominal pain. no Weight  loss.    _ENT_ :    no Sinus pain.    _Endocrine_ :    Patient complains of: hot flashes and tolerable: variable: .    _Cardiovascular_ :    no Chest pain. no Palpitations.    _Pulmonary_ :    no cough. shortness of breath yes.    _Gastroenterology_ :    no Constipation. no Diarrhea.    _Integumentary_ :    no Skin rash.    _Urology_ :    no Dysuria. no Blood in urine. no Frequent urination.          **Reason for Appointment**    ---      1\. Phone Call per Pts daughter    ---    2\. Prostate cancer follow up    ---      **History of Present Illness**    ---     _Urological History_ :    pt with a complicated hx of prostate cancer and difficulty in follow up. He  lives part time in [place]: pt and his daughter: on the phone and they  consent ot a visit: pt did her get  his Lupron in [place] early January: Xgeva also  on May 11, 2018: PSA is down to zero: pt is voiding well, good flow, emptying  well, no dysuria, no hematuria: good control.. but some frequency due to a  diuretic.      **Examination**    ---     _General_ :    general pt sounds good over the phone: NAD.         **Assessments**    ---    1\. Prostate cancer - C61 (Primary)    ---     On May 19, 2018, the World Health Organization declared the COVID-19 viral  disease to be a pandemic. As a result of this emergency, the practice patterns  of Physicians, Physician Assistants and Nurse Practitioners are shifting to  accommodate the need to treat in conjunction with unprecedented  guidance from  federal, state and local authorities which include, but are not limited to,  self-quarantine and/or limiting physical proximity to others under any number  of circumstances. It is within this context (and understanding that this  method of patient encounter is in the patients best interest as well as the  health and safety of other patients and the public) that telehealth is being  provided for this patient encounter rather than a face to face visit. This  patient encounter is appropriate and reasonable under the circumstances given  the patients particular presentation at this time. The patient has been  advised of the potential risks and limitations of this mode of treatment  (including, but not limited to, the absence of an in-person exam) and has  agreed to be treated in a remote fashion in spite of them. Any and all of the  patients or their familys questions on this issue have been answered and I  have made no promises or guarantees to the patient. The patient has also been  advised to contact this office for worsening conditions or problems, and seek  emergency medical treatment and/or call 911 if the patient deems either  necessary.    ---      **Treatment**    ---      **1\. Prostate cancer**    Start Megestrol Acetate  Tablet, 20 MG, 1 tablet, Orally, Twice a day, 30  day(s), 60 Tablet, Refills 0    Notes: I spoke with the pt and his daughter at length. They have the info from  [place] and will try to fax it up to [place]. Initially I told then that it would be  safe to wait another month for the Lupron but they told me that they have an  appt at the [facility] cancer center next Wednesday for labs and Xgeva. We will  contact them to confirm this and if so then they can stop by here first for a  Lupron unless the cancer was planning to give it to him. Clinically he is  doing well and is tolerating the hot flashes but would like some Megace in  case they get worse. will arrange. All ?'s answered and 20 minutes spent with  them on the phone.    ---     **Follow Up**    ---    3 and a half months for Lupron    Electronically signed by Leola Brazil , MD on 06/16/2018 at 02:40 PM EDT    Sign off status: Completed        * * [facility]    [address]    [place], [place] [id number]    Tel: [phone]    Fax: [phone]              * * *         Patient: Gaelen, Brager DOB: February 07, 1932 Progress Note: [name].  [name], [name] 06/16/2018    ---    Note generated by eClinicalWorks EMR/PM Software ([email or web address])

## 2018-06-23 ENCOUNTER — Ambulatory Visit: Admitting: Urology

## 2018-06-23 ENCOUNTER — Ambulatory Visit: Admitting: Family

## 2018-06-23 DIAGNOSIS — I4892 Unspecified atrial flutter: Secondary | ICD-10-CM | POA: Diagnosis not present

## 2018-06-23 DIAGNOSIS — Z87891 Personal history of nicotine dependence: Secondary | ICD-10-CM | POA: Diagnosis not present

## 2018-06-23 DIAGNOSIS — C7951 Secondary malignant neoplasm of bone: Secondary | ICD-10-CM | POA: Diagnosis not present

## 2018-06-23 DIAGNOSIS — Z9181 History of falling: Secondary | ICD-10-CM | POA: Diagnosis not present

## 2018-06-23 DIAGNOSIS — I10 Essential (primary) hypertension: Secondary | ICD-10-CM | POA: Diagnosis not present

## 2018-06-23 DIAGNOSIS — G893 Neoplasm related pain (acute) (chronic): Secondary | ICD-10-CM | POA: Diagnosis not present

## 2018-06-23 LAB — HX CALCIUM LEVEL TOTAL
CASE NUMBER: 2020106001214
HX CALCIUM LVL: 8.9 mg/dL — NL (ref 8.5–10.5)

## 2018-06-23 LAB — HX CREATININE LEVEL
CASE NUMBER: 2020106001214
HX CREATININE: 1.54 mg/dL — ABNORMAL HIGH (ref 0.55–1.3)

## 2018-06-23 LAB — HX MAGNESIUM LEVEL
CASE NUMBER: 2020106001214
HX MAGNESIUM LVL: 2.1 mg/dL — NL (ref 1.7–2.5)

## 2018-06-23 LAB — HX PROSTATE SPECIFIC ANTIGEN DIAGNOSTIC
CASE NUMBER: 2020106001215
HX PSA: 0.07 ng/mL — NL (ref 0.0–4.0)

## 2018-06-23 LAB — HX GLOMERULAR FILTRATION RATE (ESTIMATED)
CASE NUMBER: 2020106001214
HX AFN AMER GLOMERULAR FILTRATION RATE: 47 mL/min/{1.73_m2}
HX NON-AFN AMER GLOMERULAR FILTRATION RATE: 41 mL/min/{1.73_m2}

## 2018-06-23 LAB — HX ALBUMIN LEVEL
CASE NUMBER: 2020106001214
HX ALBUMIN LVL: 3.5 g/dL — NL (ref 3.2–5.0)

## 2018-06-23 LAB — HX PHOSPHORUS LEVEL
CASE NUMBER: 2020106001214
HX PHOSPHORUS: 2.6 mg/dL — NL (ref 2.4–4.9)

## 2018-06-23 NOTE — Other (Deleted)
 [email or web address]-[email or web address] Intake Entered On:  09/15/2018 16:07     Performed On:  09/15/2018 16:06  by Channing Mutters RN, Raynelle Fanning               General Information - Med-Onc   Fall Risk Assessment :   No   Have you been hospitalized since your last visit? :   No   Pain Symptoms :   No   Have you had any tests SLV :   No   VAD Monthly Flush :   No   Glennie Isle, Raynelle Fanning - 09/15/2018 16:06    Infectious Disease Risk Screening   Chills :   No   Fever :   No   Fatigue :   No   Headache :   No   Runny or Stuffy Nose :   No   Sore Throat :   No   Shortness of Breath :   No   New or Worsening Cough :   No   Vomiting :   No   Diarrhea :   No   Muscle Pain :   No   Recent Exposure to Communicable Disease :   No   Illness With Generalized Rash :   No   Nash Dimmer - 09/15/2018 16:06    Patient Travel History :   No recent travel   Recent Family Travel History :   No recent travel   Close contact w/ suspect case COVID-19 :   No   Close contact w/ lab-confirmed case COVID-19 :   No   Lab confirmed positive COVID-19 test result? :   No   Nash Dimmer - 09/15/2018 16:06    Medication History   MAO Inhibitor in past 3 weeks :   No   Nash Dimmer - 09/15/2018 16:06    Medication List   (As Of: 09/15/2018 16:07:08 )   Normal Order    denosumab 120 mg/1.7 mL  :   denosumab 120 mg/1.7 mL ; Status:   Completed ; Ordered As Mnemonic:   Xgeva ; Simple Display Line:   120 mg, 1.7 mL, sc, Once ; Ordering Provider:   Lorraine Lax NP, Rayfield Citizen; Catalog Code:   denosumab ; Order Dt/Tm:   09/15/2018 15:25:51  ; Comment:   q4week treatment            Prescription/Discharge Order    potassium phosphate-sodium phosphate  :   potassium phosphate-sodium phosphate ; Status:   Prescribed ; Ordered As Mnemonic:   Phospha 250 Neutral oral tablet ; Simple Display Line:   See Instructions, 1 tab twice a day for 3 days, 6 tab(s), 0 Refill(s) ; Ordering Provider:   Lorraine Lax NP, Rayfield Citizen; Catalog Code:   potassium phosphate-sodium phosphate ; Order Dt/Tm:   12/09/2017 16:32:48              Home Meds    calcium carbonate  :   calcium carbonate ; Status:   Documented ; Ordered As Mnemonic:   calcium (as carbonate) 600 mg oral tablet ; Simple Display Line:   600 mg, 1 tab(s), PO, Daily, 0 Refill(s) ; Catalog Code:   calcium carbonate ; Order Dt/Tm:   10/02/2017 17:18:41           oxyCODONE  :   oxyCODONE ; Status:   Documented ; Ordered As Mnemonic:   oxyCODONE ; Simple Display Line:   5 mg, PO, as needed, 0 Refill(s) ; Catalog Code:   oxyCODONE ;  Order Dt/Tm:   09/29/2017 15:24:05           cholecalciferol  :   cholecalciferol ; Status:   Documented ; Ordered As Mnemonic:   Vitamin D3 ; Simple Display Line:   1,000 Int_Unit, PO, Daily, 0 Refill(s) ; Catalog Code:   cholecalciferol ; Order Dt/Tm:   09/04/2017 14:19:46           codeine-guaifenesin  :   codeine-guaifenesin ; Status:   Documented ; Ordered As Mnemonic:   Robitussin AC ; Simple Display Line:   10 mL, PO, q4hr, 10ml twice a day as needed, 0 Refill(s) ; Catalog Code:   codeine-guaifenesin ; Order Dt/Tm:   08/14/2017 11:47:36           polyethylene glycol 3350  :   polyethylene glycol 3350 ; Status:   Documented ; Ordered As Mnemonic:   MiraLax oral powder for reconstitution ; Simple Display Line:   17 Gm, PO, Daily, PRN: Constipation, 0 Refill(s) ; Catalog Code:   polyethylene glycol 3350 ; Order Dt/Tm:   08/14/2017 11:47:11           ondansetron  :   ondansetron ; Status:   Documented ; Ordered As Mnemonic:   Zofran 4 mg oral tablet ; Simple Display Line:   See Instructions, 1 tab(s) PO q8hr PRN, 0 Refill(s) ; Catalog Code:   ondansetron ; Order Dt/Tm:   08/14/2017 11:46:16           omeprazole  :   omeprazole ; Status:   Documented ; Ordered As Mnemonic:   omeprazole ; Simple Display Line:   20 mg, PO, Daily, 0 Refill(s) ; Catalog Code:   omeprazole ; Order Dt/Tm:   08/14/2017 11:45:57           fentaNYL  :   fentaNYL ; Status:   Documented ; Ordered As Mnemonic:   fentaNYL ; Simple Display Line:   25 mcg, q72hr, change q 3 days fentayl  25 mgPartial fill upon request, 0 Refill(s) ; Catalog Code:   fentaNYL ; Order Dt/Tm:   08/14/2017 11:39:32           loperamide  :   loperamide ; Status:   Documented ; Ordered As Mnemonic:   loperamide 2 mg oral tablet ; Simple Display Line:   2 mg, 1 tab(s), PO, q6hr, as needed, 0 Refill(s) ; Catalog Code:   loperamide ; Order Dt/Tm:   08/14/2017 11:38:26           acetaminophen  :   acetaminophen ; Status:   Documented ; Ordered As Mnemonic:   acetaminophen 325 mg oral tablet ; Simple Display Line:   650 mg, 2 tab(s), PO, q4hr, PRN: Fever/Pain, Mild to Moderate, 0 Refill(s) ; Ordering Provider:   Susette Racer MD, Collene Leyden; Catalog Code:   acetaminophen ; Order Dt/Tm:   07/22/2017 08:43:08           Al hydroxide/Mg hydroxide/simethicone  :   Al hydroxide/Mg hydroxide/simethicone ; Status:   Documented ; Ordered As Mnemonic:   aluminum hydroxide/magnesium hydroxide/simethicone 200 mg-200 mg-20 mg/5 mL oral suspension ; Simple Display Line:   30 mL, PO, q4hr, PRN: Dyspepsia, 0 Refill(s) ; Ordering Provider:   Susette Racer MD, Collene Leyden; Catalog Code:   Al hydroxide/Mg hydroxide/simethicone ; Order Dt/Tm:   07/22/2017 08:43:18           docusate  :   docusate ; Status:   Documented ; Ordered As Mnemonic:   docusate  sodium 100 mg oral capsule ; Simple Display Line:   100 mg, 1 cap(s), PO, BID, PRN: Constipation, 0 Refill(s) ; Ordering Provider:   Susette Racer MD, Collene Leyden; Catalog Code:   docusate ; Order Dt/Tm:   07/22/2017 08:43:25           lisinopril  :   lisinopril ; Status:   Documented ; Ordered As Mnemonic:   lisinopril 5 mg oral tablet ; Simple Display Line:   5 mg, 1 tab(s), PO, Daily, 0 Refill(s) ; Ordering Provider:   Susette Racer MD, Collene Leyden; Catalog Code:   lisinopril ; Order Dt/Tm:   07/22/2017 08:43:40           aspirin  :   aspirin ; Status:   Documented ; Ordered As Mnemonic:   aspirin ; Simple Display Line:   81 mg, Chewed, Daily, 0 Refill(s) ; Catalog Code:   aspirin ; Order Dt/Tm:   07/18/2017 08:40:53            gabapentin  :   gabapentin ; Status:   Documented ; Ordered As Mnemonic:   gabapentin 300 mg oral capsule ; Simple Display Line:   300 mg, 1 cap(s), PO, BID, 0 Refill(s) ; Catalog Code:   gabapentin ; Order Dt/Tm:   07/12/2017 18:01:00           ALPRAZolam  :   ALPRAZolam ; Status:   Documented ; Ordered As Mnemonic:   Xanax ; Simple Display Line:   See Instructions, 0.25 mg PO TID PRN, PRN: as needed for anxiety, 0 Refill(s) ; Catalog Code:   ALPRAZolam ; Order Dt/Tm:   09/07/2015 07:07:23             Social History   Current Smoking Status :   Former smoker   Did the patient smoke (past 12 months) :   No tobacco smoking   Patient used other tobacco products in the last 30 days? :   No   Nash Dimmer - 09/15/2018 16:06    Social History   (As Of: 09/15/2018 16:07:08 )   Tobacco:        Former smoker   (Last Updated: 10/19/2015 07:51:16  by Konrad Penta RN, Johnny Bridge)          Alcohol:        None   (Last Updated: 10/19/2015 07:51:23  by Konrad Penta RN, Johnny Bridge)          Substance Use:        Never   (Last Updated: 10/19/2015 07:51:50  by Konrad Penta RN, Johnny Bridge)          Home/Environment:        Lives with Children.   Comments:  08/14/2017 11:49 - Margaretha Glassing RN, [name]: lives with daughter and brother has 7 children   (Last Updated: 08/14/2017 11:49:19  by Margaretha Glassing RN, [name])          Nutrition:        Regular   (Last Updated: 08/14/2017 11:41:02  by Margaretha Glassing RN, [name])          Employment/School:        Retired   (Last Updated: 08/14/2017 11:40:49  by Margaretha Glassing RN, [name])            ONC Nutrition   MST Patient Able to Complete Assessment :   Yes   MST Score :   0    MST Lose Weight Without Trying :   No   Appetite :   Good  Nash Dimmer - 09/15/2018 16:06

## 2018-06-23 NOTE — Other (Addendum)
 Med-Onc Intake Entered On:  06/23/2018 15:14     Performed On:  06/23/2018 15:11  by Juliene Pina RN, Beth               General Information - Med-Onc   Fall Risk Assessment :   No   Have you been hospitalized since your last visit? :   No   Pain Symptoms :   No   Have you had any tests SLV :   No   VAD Monthly Flush :   No   Cancer Fatigue Scale :   2-Mild fatigue   Cancer Fatigue Score :   2    Mody RN, Beth - 06/23/2018 15:11    Infectious Disease Risk Screening   Chills :   No   Fever :   No   Fatigue :   No   Headache :   No   Runny or Stuffy Nose :   No   Sore Throat :   No   Shortness of Breath :   No   New or Worsening Cough :   No   Vomiting :   No   Diarrhea :   No   Muscle Pain :   No   Recent Exposure to Communicable Disease :   No   Illness With Generalized Rash :   No   Mody RN, Beth - 06/23/2018 15:11    Patient Travel History :   No recent travel   Recent Family Travel History :   No recent travel   Close contact w/ suspect case COVID-19 :   No   Close contact w/ lab-confirmed case COVID-19 :   No   Mody RN, Beth - 06/23/2018 15:11    Medication History   MAO Inhibitor in past 3 weeks :   No   Mody RN, Beth - 06/23/2018 15:11    Medication List   (As Of: 06/23/2018 15:14:11 )   Normal Order    denosumab 120 mg/1.7 mL  :   denosumab 120 mg/1.7 mL ; Status:   Completed ; Ordered As Mnemonic:   Xgeva ; Simple Display Line:   120 mg, 1.7 mL, sc, Once ; Ordering Provider:   Lorraine Lax NP, Rayfield Citizen; Catalog Code:   denosumab ; Order Dt/Tm:   06/23/2018 14:46:05  ; Comment:   q4week treatment            Prescription/Discharge Order    potassium phosphate-sodium phosphate  :   potassium phosphate-sodium phosphate ; Status:   Prescribed ; Ordered As Mnemonic:   Phospha 250 Neutral oral tablet ; Simple Display Line:   See Instructions, 1 tab twice a day for 3 days, 6 tab(s), 0 Refill(s) ; Ordering Provider:   Lorraine Lax NP, Rayfield Citizen; Catalog Code:   potassium phosphate-sodium phosphate ; Order Dt/Tm:   12/09/2017 16:32:48              Home Meds    calcium carbonate  :   calcium carbonate ; Status:   Documented ; Ordered As Mnemonic:   calcium (as carbonate) 600 mg oral tablet ; Simple Display Line:   600 mg, 1 tab(s), PO, Daily, 0 Refill(s) ; Catalog Code:   calcium carbonate ; Order Dt/Tm:   10/02/2017 17:18:41           oxyCODONE  :   oxyCODONE ; Status:   Documented ; Ordered As Mnemonic:   oxyCODONE ; Simple Display Line:   5 mg, PO, as needed,  0 Refill(s) ; Catalog Code:   oxyCODONE ; Order Dt/Tm:   09/29/2017 15:24:05           cholecalciferol  :   cholecalciferol ; Status:   Documented ; Ordered As Mnemonic:   Vitamin D3 ; Simple Display Line:   1,000 Int_Unit, PO, Daily, 0 Refill(s) ; Catalog Code:   cholecalciferol ; Order Dt/Tm:   09/04/2017 14:19:46           polyethylene glycol 3350  :   polyethylene glycol 3350 ; Status:   Documented ; Ordered As Mnemonic:   MiraLax oral powder for reconstitution ; Simple Display Line:   17 Gm, PO, Daily, PRN: Constipation, 0 Refill(s) ; Catalog Code:   polyethylene glycol 3350 ; Order Dt/Tm:   08/14/2017 11:47:11           codeine-guaifenesin  :   codeine-guaifenesin ; Status:   Documented ; Ordered As Mnemonic:   Robitussin AC ; Simple Display Line:   10 mL, PO, q4hr, 10ml twice a day as needed, 0 Refill(s) ; Catalog Code:   codeine-guaifenesin ; Order Dt/Tm:   08/14/2017 11:47:36           ondansetron  :   ondansetron ; Status:   Documented ; Ordered As Mnemonic:   Zofran 4 mg oral tablet ; Simple Display Line:   See Instructions, 1 tab(s) PO q8hr PRN, 0 Refill(s) ; Catalog Code:   ondansetron ; Order Dt/Tm:   08/14/2017 11:46:16           omeprazole  :   omeprazole ; Status:   Documented ; Ordered As Mnemonic:   omeprazole ; Simple Display Line:   20 mg, PO, Daily, 0 Refill(s) ; Catalog Code:   omeprazole ; Order Dt/Tm:   08/14/2017 11:45:57           fentaNYL  :   fentaNYL ; Status:   Documented ; Ordered As Mnemonic:   fentaNYL ; Simple Display Line:   25 mcg, q72hr, change q 3 days  fentayl 25 mgPartial fill upon request, 0 Refill(s) ; Catalog Code:   fentaNYL ; Order Dt/Tm:   08/14/2017 11:39:32           loperamide  :   loperamide ; Status:   Documented ; Ordered As Mnemonic:   loperamide 2 mg oral tablet ; Simple Display Line:   2 mg, 1 tab(s), PO, q6hr, as needed, 0 Refill(s) ; Catalog Code:   loperamide ; Order Dt/Tm:   08/14/2017 11:38:26           docusate  :   docusate ; Status:   Documented ; Ordered As Mnemonic:   docusate sodium 100 mg oral capsule ; Simple Display Line:   100 mg, 1 cap(s), PO, BID, PRN: Constipation, 0 Refill(s) ; Ordering Provider:   Susette Racer MD, Collene Leyden; Catalog Code:   docusate ; Order Dt/Tm:   07/22/2017 08:43:25           Al hydroxide/Mg hydroxide/simethicone  :   Al hydroxide/Mg hydroxide/simethicone ; Status:   Documented ; Ordered As Mnemonic:   aluminum hydroxide/magnesium hydroxide/simethicone 200 mg-200 mg-20 mg/5 mL oral suspension ; Simple Display Line:   30 mL, PO, q4hr, PRN: Dyspepsia, 0 Refill(s) ; Ordering Provider:   Susette Racer MD, Collene Leyden; Catalog Code:   Al hydroxide/Mg hydroxide/simethicone ; Order Dt/Tm:   07/22/2017 08:43:18           acetaminophen  :   acetaminophen ; Status:   Documented ;  Ordered As Mnemonic:   acetaminophen 325 mg oral tablet ; Simple Display Line:   650 mg, 2 tab(s), PO, q4hr, PRN: Fever/Pain, Mild to Moderate, 0 Refill(s) ; Ordering Provider:   Susette Racer MD, Collene Leyden; Catalog Code:   acetaminophen ; Order Dt/Tm:   07/22/2017 08:43:08           lisinopril  :   lisinopril ; Status:   Documented ; Ordered As Mnemonic:   lisinopril 5 mg oral tablet ; Simple Display Line:   5 mg, 1 tab(s), PO, Daily, 0 Refill(s) ; Ordering Provider:   Susette Racer MD, Collene Leyden; Catalog Code:   lisinopril ; Order Dt/Tm:   07/22/2017 08:43:40           aspirin  :   aspirin ; Status:   Documented ; Ordered As Mnemonic:   aspirin ; Simple Display Line:   81 mg, Chewed, Daily, 0 Refill(s) ; Catalog Code:   aspirin ; Order Dt/Tm:   07/18/2017 08:40:53            gabapentin  :   gabapentin ; Status:   Documented ; Ordered As Mnemonic:   gabapentin 300 mg oral capsule ; Simple Display Line:   300 mg, 1 cap(s), PO, BID, 0 Refill(s) ; Catalog Code:   gabapentin ; Order Dt/Tm:   07/12/2017 18:01:00           ALPRAZolam  :   ALPRAZolam ; Status:   Documented ; Ordered As Mnemonic:   Xanax ; Simple Display Line:   See Instructions, 0.25 mg PO TID PRN, PRN: as needed for anxiety, 0 Refill(s) ; Catalog Code:   ALPRAZolam ; Order Dt/Tm:   09/07/2015 07:07:23             Social History   Current Smoking Status :   Former smoker   Did the patient smoke (past 12 months) :   No tobacco smoking   Patient used other tobacco products in the last 30 days? :   No   Nurse, adult, Beth - 06/23/2018 15:11    Social History   (As Of: 06/23/2018 15:14:11 )   Tobacco:        Former smoker   (Last Updated: 10/19/2015 07:51:16  by Konrad Penta RN, Johnny Bridge)          Alcohol:        None   (Last Updated: 10/19/2015 07:51:23  by Konrad Penta RN, Johnny Bridge)          Substance Use:        Never   (Last Updated: 10/19/2015 07:51:50  by Konrad Penta RN, Johnny Bridge)          Home/Environment:        Lives with Children.   Comments:  08/14/2017 11:49 - Margaretha Glassing RN, [name]: lives with daughter and brother has 7 children   (Last Updated: 08/14/2017 11:49:19  by Margaretha Glassing RN, [name])          Nutrition:        Regular   (Last Updated: 08/14/2017 11:41:02  by Margaretha Glassing RN, [name])          Employment/School:        Retired   (Last Updated: 08/14/2017 11:40:49  by Margaretha Glassing RN, [name])            ONC Nutrition   MST Patient Able to Complete Assessment :   Yes   MST Score :   0    MST Lose Weight Without Trying :   No  Mody RN, Beth - 06/23/2018 15:11

## 2018-06-23 NOTE — Other (Addendum)
 Med-Onc Intake Entered On:  09/15/2018 16:07     Performed On:  09/15/2018 16:06  by Channing Mutters RN, Jasmine December Information - Med-Onc   Fall Risk Assessment :   No   Have you been hospitalized since your last visit? :   No   Pain Symptoms :   No   Have you had any tests SLV :   No   VAD Monthly Flush :   No   Glennie Isle, Raynelle Fanning - 09/15/2018 16:06    Infectious Disease Risk Screening   Chills :   No   Fever :   No   Fatigue :   No   Headache :   No   Runny or Stuffy Nose :   No   Sore Throat :   No   Shortness of Breath :   No   New or Worsening Cough :   No   Vomiting :   No   Diarrhea :   No   Muscle Pain :   No   Recent Exposure to Communicable Disease :   No   Illness With Generalized Rash :   No   Nash Dimmer - 09/15/2018 16:06    Patient Travel History :   No recent travel   Recent Family Travel History :   No recent travel   Close contact w/ suspect case COVID-19 :   No   Close contact w/ lab-confirmed case COVID-19 :   No   Lab confirmed positive COVID-19 test result? :   No   Nash Dimmer - 09/15/2018 16:06    Medication History   MAO Inhibitor in past 3 weeks :   No   Nash Dimmer - 09/15/2018 16:06    Medication List   (As Of: 09/15/2018 16:07:08 )   Normal Order    denosumab 120 mg/1.7 mL  :   denosumab 120 mg/1.7 mL ; Status:   Completed ; Ordered As Mnemonic:   Xgeva ; Simple Display Line:   120 mg, 1.7 mL, sc, Once ; Ordering Provider:   Lorraine Lax NP, Ray[place] Citizen; Catalog Code:   denosumab ; Order Dt/Tm:   09/15/2018 15:25:51  ; Comment:   q4week treatment            Prescription/Discharge Order    potassium phosphate-sodium phosphate  :   potassium phosphate-sodium phosphate ; Status:   Prescribed ; Ordered As Mnemonic:   Phospha 250 Neutral oral tablet ; Simple Display Line:   See Instructions, 1 tab twice a day for 3 days, 6 tab(s), 0 Refill(s) ; Ordering Provider:   Lorraine Lax NP, Rayfield Citizen; Catalog Code:   potassium phosphate-sodium phosphate ; Order Dt/Tm:   12/09/2017 16:32:48             Home Meds     calcium carbonate  :   calcium carbonate ; Status:   Documented ; Ordered As Mnemonic:   calcium (as carbonate) 600 mg oral tablet ; Simple Display Line:   600 mg, 1 tab(s), PO, Daily, 0 Refill(s) ; Catalog Code:   calcium carbonate ; Order Dt/Tm:   10/02/2017 17:18:41           oxyCODONE  :   oxyCODONE ; Status:   Documented ; Ordered As Mnemonic:   oxyCODONE ; Simple Display Line:   5 mg, PO, as needed, 0 Refill(s) ; Catalog Code:   oxyCODONE ;  Order Dt/Tm:   09/29/2017 15:24:05           cholecalciferol  :   cholecalciferol ; Status:   Documented ; Ordered As Mnemonic:   Vitamin D3 ; Simple Display Line:   1,000 Int_Unit, PO, Daily, 0 Refill(s) ; Catalog Code:   cholecalciferol ; Order Dt/Tm:   09/04/2017 14:19:46           codeine-guaifenesin  :   codeine-guaifenesin ; Status:   Documented ; Ordered As Mnemonic:   Robitussin AC ; Simple Display Line:   10 mL, PO, q4hr, 10ml twice a day as needed, 0 Refill(s) ; Catalog Code:   codeine-guaifenesin ; Order Dt/Tm:   08/14/2017 11:47:36           polyethylene glycol 3350  :   polyethylene glycol 3350 ; Status:   Documented ; Ordered As Mnemonic:   MiraLax oral powder for reconstitution ; Simple Display Line:   17 Gm, PO, Daily, PRN: Constipation, 0 Refill(s) ; Catalog Code:   polyethylene glycol 3350 ; Order Dt/Tm:   08/14/2017 11:47:11           ondansetron  :   ondansetron ; Status:   Documented ; Ordered As Mnemonic:   Zofran 4 mg oral tablet ; Simple Display Line:   See Instructions, 1 tab(s) PO q8hr PRN, 0 Refill(s) ; Catalog Code:   ondansetron ; Order Dt/Tm:   08/14/2017 11:46:16           omeprazole  :   omeprazole ; Status:   Documented ; Ordered As Mnemonic:   omeprazole ; Simple Display Line:   20 mg, PO, Daily, 0 Refill(s) ; Catalog Code:   omeprazole ; Order Dt/Tm:   08/14/2017 11:45:57           fentaNYL  :   fentaNYL ; Status:   Documented ; Ordered As Mnemonic:   fentaNYL ; Simple Display Line:   25 mcg, q72hr, change q 3 days fentayl 25 mgPartial  fill upon request, 0 Refill(s) ; Catalog Code:   fentaNYL ; Order Dt/Tm:   08/14/2017 11:39:32           loperamide  :   loperamide ; Status:   Documented ; Ordered As Mnemonic:   loperamide 2 mg oral tablet ; Simple Display Line:   2 mg, 1 tab(s), PO, q6hr, as needed, 0 Refill(s) ; Catalog Code:   loperamide ; Order Dt/Tm:   08/14/2017 11:38:26           acetaminophen  :   acetaminophen ; Status:   Documented ; Ordered As Mnemonic:   acetaminophen 325 mg oral tablet ; Simple Display Line:   650 mg, 2 tab(s), PO, q4hr, PRN: Fever/Pain, Mild to Moderate, 0 Refill(s) ; Ordering Provider:   Susette Racer MD, Collene Leyden; Catalog Code:   acetaminophen ; Order Dt/Tm:   07/22/2017 08:43:08           Al hydroxide/Mg hydroxide/simethicone  :   Al hydroxide/Mg hydroxide/simethicone ; Status:   Documented ; Ordered As Mnemonic:   aluminum hydroxide/magnesium hydroxide/simethicone 200 mg-200 mg-20 mg/5 mL oral suspension ; Simple Display Line:   30 mL, PO, q4hr, PRN: Dyspepsia, 0 Refill(s) ; Ordering Provider:   Susette Racer MD, Collene Leyden; Catalog Code:   Al hydroxide/Mg hydroxide/simethicone ; Order Dt/Tm:   07/22/2017 08:43:18           docusate  :   docusate ; Status:   Documented ; Ordered As Mnemonic:   docusate  sodium 100 mg oral capsule ; Simple Display Line:   100 mg, 1 cap(s), PO, BID, PRN: Constipation, 0 Refill(s) ; Ordering Provider:   Susette Racer MD, Collene Leyden; Catalog Code:   docusate ; Order Dt/Tm:   07/22/2017 08:43:25           lisinopril  :   lisinopril ; Status:   Documented ; Ordered As Mnemonic:   lisinopril 5 mg oral tablet ; Simple Display Line:   5 mg, 1 tab(s), PO, Daily, 0 Refill(s) ; Ordering Provider:   Susette Racer MD, Collene Leyden; Catalog Code:   lisinopril ; Order Dt/Tm:   07/22/2017 08:43:40           aspirin  :   aspirin ; Status:   Documented ; Ordered As Mnemonic:   aspirin ; Simple Display Line:   81 mg, Chewed, Daily, 0 Refill(s) ; Catalog Code:   aspirin ; Order Dt/Tm:   07/18/2017 08:40:53           gabapentin  :    gabapentin ; Status:   Documented ; Ordered As Mnemonic:   gabapentin 300 mg oral capsule ; Simple Display Line:   300 mg, 1 cap(s), PO, BID, 0 Refill(s) ; Catalog Code:   gabapentin ; Order Dt/Tm:   07/12/2017 18:01:00           ALPRAZolam  :   ALPRAZolam ; Status:   Documented ; Ordered As Mnemonic:   Xanax ; Simple Display Line:   See Instructions, 0.25 mg PO TID PRN, PRN: as needed for anxiety, 0 Refill(s) ; Catalog Code:   ALPRAZolam ; Order Dt/Tm:   09/07/2015 07:07:23             Social History   Current Smoking Status :   Former smoker   Did the patient smoke (past 12 months) :   No tobacco smoking   Patient used other tobacco products in the last 30 days? :   No   Nash Dimmer - 09/15/2018 16:06    Social History   (As Of: 09/15/2018 16:07:08 )   Tobacco:        Former smoker   (Last Updated: 10/19/2015 07:51:16  by Konrad Penta RN, Johnny Bridge)          Alcohol:        None   (Last Updated: 10/19/2015 07:51:23  by Konrad Penta RN, Johnny Bridge)          Substance Use:        Never   (Last Updated: 10/19/2015 07:51:50  by Konrad Penta RN, Johnny Bridge)          Home/Environment:        Lives with Children.   Comments:  08/14/2017 11:49 - Margaretha Glassing RN, [name]: lives with daughter and brother has 7 children   (Last Updated: 08/14/2017 11:49:19  by Margaretha Glassing RN, [name])          Nutrition:        Regular   (Last Updated: 08/14/2017 11:41:02  by Margaretha Glassing RN, [name])          Employment/School:        Retired   (Last Updated: 08/14/2017 11:40:49  by Margaretha Glassing RN, [name])            ONC Nutrition   MST Patient Able to Complete Assessment :   Yes   MST Score :   0    MST Lose Weight Without Trying :   No   Appetite :   Good  Nash Dimmer - 09/15/2018 16:06

## 2018-06-23 NOTE — Progress Notes (Signed)
* * *      **  [name]**    ------    78 Y old Male, DOB: 1931-05-03    [address], [place], [place] [address]    Home: [phone]    Provider: [name]        * * *    Telephone Encounter    ---    Answered by  Wendelyn Breslow Date: 06/23/2018       Time: 11:51 AM    Caller  pt    ------            Reason  early            Message                     pt is going to be early due to having a 2:00 appt elsewhere                Action Taken                     Morse,Linda  06/23/2018 11:52:11 AM >      [name],[name]  06/23/2018 12:55:06 PM > noted                    * * *                ---          * * *         Patient: [name] DOB: January 09, 1932 Provider: [name]  06/23/2018    ---    Note generated by eClinicalWorks EMR/PM Software ([email or web address])

## 2018-06-23 NOTE — Other (Addendum)
 Med-Onc Intake Entered On:  07/21/2018 16:01     Performed On:  07/21/2018 15:59  by Esmond Harps RN, Lawson Fiscal               General Information - Med-Onc   Preferred Language :   [place]   Fall Risk Assessment :   No   Have you been hospitalized since your last visit? :   No   Pain Symptoms :   No   Have you had any tests SLV :   No   VAD Monthly Flush :   No   Cancer Fatigue Scale :   2-Mild fatigue   Cancer Fatigue Score :   2    Brickey RN, Lori - 07/21/2018 15:59    Infectious Disease Risk Screening   Chills :   No   Fever :   No   Fatigue :   No   Headache :   No   Runny or Stuffy Nose :   No   Sore Throat :   No   Shortness of Breath :   No   New or Worsening Cough :   No   Vomiting :   No   Diarrhea :   No   Muscle Pain :   No   Recent Exposure to Communicable Disease :   No   Illness With Generalized Rash :   No   Brickey RN, Lawson Fiscal - 07/21/2018 15:59    Patient Travel History :   No recent travel   Recent Family Travel History :   No recent travel   Close contact w/ suspect case COVID-19 :   No   Close contact w/ lab-confirmed case COVID-19 :   No   Lab confirmed positive COVID-19 test result? :   No   Esmond Harps RN, Lori - 07/21/2018 15:59    Medication History   MAO Inhibitor in past 3 weeks :   Unknown   Brickey RN, Lori - 07/21/2018 15:59    Medication List   (As Of: 07/21/2018 16:01:20 )   Normal Order    denosumab 120 mg/1.7 mL  :   denosumab 120 mg/1.7 mL ; Status:   Ordered ; Ordered As Mnemonic:   Xgeva ; Simple Display Line:   120 mg, 1.7 mL, sc, Once ; Ordering Provider:   Kem Kays MD, Mendel Corning; Catalog Code:   denosumab ; Order Dt/Tm:   07/21/2018 15:55:24  ; Comment:   q4week treatment            Prescription/Discharge Order    potassium phosphate-sodium phosphate  :   potassium phosphate-sodium phosphate ; Status:   Prescribed ; Ordered As Mnemonic:   Phospha 250 Neutral oral tablet ; Simple Display Line:   See Instructions, 1 tab twice a day for 3 days, 6 tab(s), 0 Refill(s) ; Ordering Provider:   Lorraine Lax  NP, Rayfield Citizen; Catalog Code:   potassium phosphate-sodium phosphate ; Order Dt/Tm:   12/09/2017 16:32:48             Home Meds    calcium carbonate  :   calcium carbonate ; Status:   Documented ; Ordered As Mnemonic:   calcium (as carbonate) 600 mg oral tablet ; Simple Display Line:   600 mg, 1 tab(s), PO, Daily, 0 Refill(s) ; Catalog Code:   calcium carbonate ; Order Dt/Tm:   10/02/2017 17:18:41           oxyCODONE  :   oxyCODONE ; Status:  Documented ; Ordered As Mnemonic:   oxyCODONE ; Simple Display Line:   5 mg, PO, as needed, 0 Refill(s) ; Catalog Code:   oxyCODONE ; Order Dt/Tm:   09/29/2017 15:24:05           cholecalciferol  :   cholecalciferol ; Status:   Documented ; Ordered As Mnemonic:   Vitamin D3 ; Simple Display Line:   1,000 Int_Unit, PO, Daily, 0 Refill(s) ; Catalog Code:   cholecalciferol ; Order Dt/Tm:   09/04/2017 14:19:46           codeine-guaifenesin  :   codeine-guaifenesin ; Status:   Documented ; Ordered As Mnemonic:   Robitussin AC ; Simple Display Line:   10 mL, PO, q4hr, 10ml twice a day as needed, 0 Refill(s) ; Catalog Code:   codeine-guaifenesin ; Order Dt/Tm:   08/14/2017 11:47:36           polyethylene glycol 3350  :   polyethylene glycol 3350 ; Status:   Documented ; Ordered As Mnemonic:   MiraLax oral powder for reconstitution ; Simple Display Line:   17 Gm, PO, Daily, PRN: Constipation, 0 Refill(s) ; Catalog Code:   polyethylene glycol 3350 ; Order Dt/Tm:   08/14/2017 11:47:11           ondansetron  :   ondansetron ; Status:   Documented ; Ordered As Mnemonic:   Zofran 4 mg oral tablet ; Simple Display Line:   See Instructions, 1 tab(s) PO q8hr PRN, 0 Refill(s) ; Catalog Code:   ondansetron ; Order Dt/Tm:   08/14/2017 11:46:16           omeprazole  :   omeprazole ; Status:   Documented ; Ordered As Mnemonic:   omeprazole ; Simple Display Line:   20 mg, PO, Daily, 0 Refill(s) ; Catalog Code:   omeprazole ; Order Dt/Tm:   08/14/2017 11:45:57           fentaNYL  :   fentaNYL ;  Status:   Documented ; Ordered As Mnemonic:   fentaNYL ; Simple Display Line:   25 mcg, q72hr, change q 3 days fentayl 25 mgPartial fill upon request, 0 Refill(s) ; Catalog Code:   fentaNYL ; Order Dt/Tm:   08/14/2017 11:39:32           loperamide  :   loperamide ; Status:   Documented ; Ordered As Mnemonic:   loperamide 2 mg oral tablet ; Simple Display Line:   2 mg, 1 tab(s), PO, q6hr, as needed, 0 Refill(s) ; Catalog Code:   loperamide ; Order Dt/Tm:   08/14/2017 11:38:26           acetaminophen  :   acetaminophen ; Status:   Documented ; Ordered As Mnemonic:   acetaminophen 325 mg oral tablet ; Simple Display Line:   650 mg, 2 tab(s), PO, q4hr, PRN: Fever/Pain, Mild to Moderate, 0 Refill(s) ; Ordering Provider:   Susette Racer MD, Collene Leyden; Catalog Code:   acetaminophen ; Order Dt/Tm:   07/22/2017 08:43:08           Al hydroxide/Mg hydroxide/simethicone  :   Al hydroxide/Mg hydroxide/simethicone ; Status:   Documented ; Ordered As Mnemonic:   aluminum hydroxide/magnesium hydroxide/simethicone 200 mg-200 mg-20 mg/5 mL oral suspension ; Simple Display Line:   30 mL, PO, q4hr, PRN: Dyspepsia, 0 Refill(s) ; Ordering Provider:   Susette Racer MD, Collene Leyden; Catalog Code:   Al hydroxide/Mg hydroxide/simethicone ; Order Dt/Tm:   07/22/2017 08:43:18  docusate  :   docusate ; Status:   Documented ; Ordered As Mnemonic:   docusate sodium 100 mg oral capsule ; Simple Display Line:   100 mg, 1 cap(s), PO, BID, PRN: Constipation, 0 Refill(s) ; Ordering Provider:   Susette Racer MD, Collene Leyden; Catalog Code:   docusate ; Order Dt/Tm:   07/22/2017 08:43:25           lisinopril  :   lisinopril ; Status:   Documented ; Ordered As Mnemonic:   lisinopril 5 mg oral tablet ; Simple Display Line:   5 mg, 1 tab(s), PO, Daily, 0 Refill(s) ; Ordering Provider:   Susette Racer MD, Collene Leyden; Catalog Code:   lisinopril ; Order Dt/Tm:   07/22/2017 08:43:40           aspirin  :   aspirin ; Status:   Documented ; Ordered As Mnemonic:   aspirin ; Simple Display  Line:   81 mg, Chewed, Daily, 0 Refill(s) ; Catalog Code:   aspirin ; Order Dt/Tm:   07/18/2017 08:40:53           gabapentin  :   gabapentin ; Status:   Documented ; Ordered As Mnemonic:   gabapentin 300 mg oral capsule ; Simple Display Line:   300 mg, 1 cap(s), PO, BID, 0 Refill(s) ; Catalog Code:   gabapentin ; Order Dt/Tm:   07/12/2017 18:01:00           ALPRAZolam  :   ALPRAZolam ; Status:   Documented ; Ordered As Mnemonic:   Xanax ; Simple Display Line:   See Instructions, 0.25 mg PO TID PRN, PRN: as needed for anxiety, 0 Refill(s) ; Catalog Code:   ALPRAZolam ; Order Dt/Tm:   09/07/2015 07:07:23             Social History   Current Smoking Status :   Former smoker   Did the patient smoke (past 12 months) :   No tobacco smoking   Patient used other tobacco products in the last 30 days? :   No   Environmental education officer - 07/21/2018 15:59    Social History   (As Of: 07/21/2018 16:01:20 )   Tobacco:        Former smoker   (Last Updated: 10/19/2015 07:51:16  by Konrad Penta RN, Johnny Bridge)          Alcohol:        None   (Last Updated: 10/19/2015 07:51:23  by Konrad Penta RN, Johnny Bridge)          Substance Use:        Never   (Last Updated: 10/19/2015 07:51:50  by Konrad Penta RN, Johnny Bridge)          Home/Environment:        Lives with Children.   Comments:  08/14/2017 11:49 - Margaretha Glassing RN, [name]: lives with daughter and brother has 7 children   (Last Updated: 08/14/2017 11:49:19  by Margaretha Glassing RN, [name])          Nutrition:        Regular   (Last Updated: 08/14/2017 11:41:02  by Margaretha Glassing RN, [name])          Employment/School:        Retired   (Last Updated: 08/14/2017 11:40:49  by Margaretha Glassing RN, [name])            ONC Nutrition   MST Patient Able to Complete Assessment :   Yes   MST Score :   0  MST Lose Weight Without Trying :   No   Esmond Harps RN, Lawson Fiscal - 07/21/2018 15:59

## 2018-06-23 NOTE — Progress Notes (Signed)
 * * *      **[name]**    ------    61 Y old Male, DOB: 1931/05/19    Account Number: [id number]    [address] [place], [place], [place]    Home: [phone]    Guarantor: [name], [name] Insurance: AARP Medicare Complete Payer ID:  [id number]    PCP: [facility]    Appointment Facility: [facility], [facility].        * * *    06/23/2018 Progress Notes: Artist Pais. Maxwell Lemen, [name]    ------    ---       **Current Medications**    ---    Taking    * Megestrol Acetate 20 MG Tablet 1 tablet Orally Twice a day, stop date 07/16/2018    ---    Unknown    * Omeprazole 20 MG Capsule Delayed Release 1 capsule Orally Once a day    ---    * Acetaminophen 500 MG Capsule 1 capsule as needed Orally every 6 hrs    ---    * Fentanyl 50 MCG/HR Patch 72 Hour 1 patch to skin Transdermal     ---    * Gabapentin 300 MG Capsule 1 capsule Orally Once a day    ---    * Lisinopril 5 MG Tablet 1 tablet Orally Once a day    ---    * Metoprolol Succinate ER 25 MG Tablet Extended Release 24 Hour 1 tablet Orally Once a day    ---    * MiraLax - Powder as directed Orally     ---    * Oxycodone HCl 5 MG/5ML Solution 5 ml as needed Orally every 6 hrs    ---    * Milk of Magnesia 400 MG/5ML Suspension 5 ml as needed Orally Four times a day    ---    * Bisacodyl 10 MG Suppository 1 suppository as needed Rectal Once a day    ---    * Aspirin 81 MG Tablet Delayed Release 1 tablet Orally Once a day    ---    * Calcium     ---    * Casodex 50 MG Tablet 1 tablet Orally Once a day    ---    * Centrum Silver Tablet as directed Orally     ---    * Hydrochlorothiazide 25 MG Tablet 1 tablet Orally Once a day    ---    * Xanax 0.25 MG Tablet 1 tablet Orally Twice a day    ---    * Hydrocodone-Acetaminophen 5-325 MG Tablet 1 tablet as needed Orally every 6 hrs    ---    * Amiodarone HCl 200 MG Tablet 1 tablet Orally Once a day    ---    * Zestril 5 MG Tablet 1 tablet Orally Once a day    ---    * Zocor 10 MG Tablet 1  tablet every evening Orally Once a day    ---    * Xarelto 15 MG Tablet Orally     ---      **Reason for Appointment**    ---      1\. needs written slip for psa to take to [facility]    ---      **History of Present Illness**    ---     _Urological History_ :    Patient is here for Lupron only.      **  Vital Signs**    ---    Ht **5'7"** , Wt **168** , BMI **26.31** , Temp **98.1** , RR **16**.      **Assessments**    ---    1\. Prostate cancer - C61 (Primary)    ---      **Treatment**    ---      **1\. Prostate cancer**    _LAB: PSA Diagnostic (Order Cancelled)_    Notes: Patient came in for Lupron injection today. Prepped area with alcohol  swab, gave Lupron IM into right gluteus muscle, covered with bandaid. Gave  patient PSA slip to have blood drawn at [facility] since he was going there today for  chemotherapy. Service provided by Karoline Caldwell RN Patient Educated with:  [email or web address] ([email or web address]).    ---     **Therapeutic Injections**    ---      Lupron : 22.5 mg (Dose No:1) (Route: Intramuscular) given by Karoline Caldwell on right gluteus    ---       **Labs**    ------    _Lab: ClinitekStatus SG_    ---      GLU Negative   \-    ------------    BIL 1+   \-    ------------    KET Negative   \-    ------------    SG >=1.030   \-    ------------    BLO Negative   \-    ------------    pH 5.5   \-    ------------    PRO 1+   \-    ------------    URO 0.2 E.U./dL   \-    ------------    NIT Negative   \-    ------------    LEU Negative   \-    ------------       Marcaurelle,[name] 06/23/2018 1:28:40 PM >    ------      **Procedure Codes**    ---      63016 UA Automated    ---    [id number] LUPRON    ---      **Follow Up**    ---    next appt 7/28 with MD    Electronically signed by Lawerance Cruel , MD on 06/30/2018 at 12:14 PM EDT    Sign off status: Completed        * * [facility]    [address]     [place], [place] [id number]    Tel: [phone]    Fax: [phone]              * * *         Patient: Farah, Benish DOB: [birth year] Progress Note: Artist Pais.  Noel Gerold, [name] 06/23/2018    ---    Note generated by eClinicalWorks EMR/PM Software ([email or web address])

## 2018-06-23 NOTE — Progress Notes (Signed)
* * *      [name], [name] **DOB:** Nov 20, 1931 (85 yo M) **Acc No.** [id number] **DOS:**  06/23/2018    ---       **[name]**    ------    54 Y old Male, DOB: 1931-06-19    [address] [place], [place], [place] [address]    Home: [phone]    Provider: [name]        * * *    Telephone Encounter    ---    Answered by  Karoline Caldwell Date: 06/23/2018       Time: 02:47 PM    Reason  NOTES from PCP and Oncologist    ------            Action Taken                     [facility]  06/23/2018 2:47:33 PM > please call PCP [facility], [address] [place], [place] 621-[address], [phone] to get most recent notes.  Also, call Oncology MD, [facility] [address] [place]  Phone [phone] for most recent notes faxed to [place]. TY!! Daughter only brought in labs and medications notes.      [name],[name]  06/25/2018 8:56:20 AM > faxed record request to both PCP and Oncology office      [name],[name]  07/13/2018 1:37:46 PM > Oncology noted from [place] is in doc, but I haven't received any notes from [place] pcp. I sent record request twice already      [facility]  07/14/2018 11:13:55 AM > noted                    * * *                ---          * * *         Provider: [name] 06/23/2018    ---    Note generated by eClinicalWorks EMR/PM Software ([email or web address])

## 2018-06-23 NOTE — Other (Deleted)
 [email or web address] Intake Entered On:  06/23/2018 15:14     Performed On:  06/23/2018 15:11  by Juliene Pina RN, Beth               General Information - Med-Onc   Fall Risk Assessment :   No   Have you been hospitalized since your last visit? :   No   Pain Symptoms :   No   Have you had any tests SLV :   No   VAD Monthly Flush :   No   Cancer Fatigue Scale :   2-Mild fatigue   Cancer Fatigue Score :   2    Mody RN, Beth - 06/23/2018 15:11    Infectious Disease Risk Screening   Chills :   No   Fever :   No   Fatigue :   No   Headache :   No   Runny or Stuffy Nose :   No   Sore Throat :   No   Shortness of Breath :   No   New or Worsening Cough :   No   Vomiting :   No   Diarrhea :   No   Muscle Pain :   No   Recent Exposure to Communicable Disease :   No   Illness With Generalized Rash :   No   Mody RN, Beth - 06/23/2018 15:11    Patient Travel History :   No recent travel   Recent Family Travel History :   No recent travel   Close contact w/ suspect case COVID-19 :   No   Close contact w/ lab-confirmed case COVID-19 :   No   Mody RN, Beth - 06/23/2018 15:11    Medication History   MAO Inhibitor in past 3 weeks :   No   Mody RN, Beth - 06/23/2018 15:11    Medication List   (As Of: 06/23/2018 15:14:11 )   Normal Order    denosumab 120 mg/1.7 mL  :   denosumab 120 mg/1.7 mL ; Status:   Completed ; Ordered As Mnemonic:   Xgeva ; Simple Display Line:   120 mg, 1.7 mL, sc, Once ; Ordering Provider:   Lorraine Lax NP, [place] Citizen; Catalog Code:   denosumab ; Order Dt/Tm:   06/23/2018 14:46:05  ; Comment:   q4week treatment            Prescription/Discharge Order    potassium phosphate-sodium phosphate  :   potassium phosphate-sodium phosphate ; Status:   Prescribed ; Ordered As Mnemonic:   Phospha 250 Neutral oral tablet ; Simple Display Line:   See Instructions, 1 tab twice a day for 3 days, 6 tab(s), 0 Refill(s) ; Ordering Provider:   Lorraine Lax NP, Rayfield Citizen; Catalog Code:   potassium phosphate-sodium phosphate ; Order Dt/Tm:   12/09/2017  16:32:48             Home Meds    calcium carbonate  :   calcium carbonate ; Status:   Documented ; Ordered As Mnemonic:   calcium (as carbonate) 600 mg oral tablet ; Simple Display Line:   600 mg, 1 tab(s), PO, Daily, 0 Refill(s) ; Catalog Code:   calcium carbonate ; Order Dt/Tm:   10/02/2017 17:18:41           oxyCODONE  :   oxyCODONE ; Status:   Documented ; Ordered As Mnemonic:   oxyCODONE ; Simple Display Line:   5 mg, PO, as needed,  0 Refill(s) ; Catalog Code:   oxyCODONE ; Order Dt/Tm:   09/29/2017 15:24:05           cholecalciferol  :   cholecalciferol ; Status:   Documented ; Ordered As Mnemonic:   Vitamin D3 ; Simple Display Line:   1,000 Int_Unit, PO, Daily, 0 Refill(s) ; Catalog Code:   cholecalciferol ; Order Dt/Tm:   09/04/2017 14:19:46           polyethylene glycol 3350  :   polyethylene glycol 3350 ; Status:   Documented ; Ordered As Mnemonic:   MiraLax oral powder for reconstitution ; Simple Display Line:   17 Gm, PO, Daily, PRN: Constipation, 0 Refill(s) ; Catalog Code:   polyethylene glycol 3350 ; Order Dt/Tm:   08/14/2017 11:47:11           codeine-guaifenesin  :   codeine-guaifenesin ; Status:   Documented ; Ordered As Mnemonic:   Robitussin AC ; Simple Display Line:   10 mL, PO, q4hr, 10ml twice a day as needed, 0 Refill(s) ; Catalog Code:   codeine-guaifenesin ; Order Dt/Tm:   08/14/2017 11:47:36           ondansetron  :   ondansetron ; Status:   Documented ; Ordered As Mnemonic:   Zofran 4 mg oral tablet ; Simple Display Line:   See Instructions, 1 tab(s) PO q8hr PRN, 0 Refill(s) ; Catalog Code:   ondansetron ; Order Dt/Tm:   08/14/2017 11:46:16           omeprazole  :   omeprazole ; Status:   Documented ; Ordered As Mnemonic:   omeprazole ; Simple Display Line:   20 mg, PO, Daily, 0 Refill(s) ; Catalog Code:   omeprazole ; Order Dt/Tm:   08/14/2017 11:45:57           fentaNYL  :   fentaNYL ; Status:   Documented ; Ordered As Mnemonic:   fentaNYL ; Simple Display Line:   25 mcg, q72hr, change  q 3 days fentayl 25 mgPartial fill upon request, 0 Refill(s) ; Catalog Code:   fentaNYL ; Order Dt/Tm:   08/14/2017 11:39:32           loperamide  :   loperamide ; Status:   Documented ; Ordered As Mnemonic:   loperamide 2 mg oral tablet ; Simple Display Line:   2 mg, 1 tab(s), PO, q6hr, as needed, 0 Refill(s) ; Catalog Code:   loperamide ; Order Dt/Tm:   08/14/2017 11:38:26           docusate  :   docusate ; Status:   Documented ; Ordered As Mnemonic:   docusate sodium 100 mg oral capsule ; Simple Display Line:   100 mg, 1 cap(s), PO, BID, PRN: Constipation, 0 Refill(s) ; Ordering Provider:   Susette Racer MD, Collene Leyden; Catalog Code:   docusate ; Order Dt/Tm:   07/22/2017 08:43:25           Al hydroxide/Mg hydroxide/simethicone  :   Al hydroxide/Mg hydroxide/simethicone ; Status:   Documented ; Ordered As Mnemonic:   aluminum hydroxide/magnesium hydroxide/simethicone 200 mg-200 mg-20 mg/5 mL oral suspension ; Simple Display Line:   30 mL, PO, q4hr, PRN: Dyspepsia, 0 Refill(s) ; Ordering Provider:   Susette Racer MD, Collene Leyden; Catalog Code:   Al hydroxide/Mg hydroxide/simethicone ; Order Dt/Tm:   07/22/2017 08:43:18           acetaminophen  :   acetaminophen ; Status:   Documented ;  Ordered As Mnemonic:   acetaminophen 325 mg oral tablet ; Simple Display Line:   650 mg, 2 tab(s), PO, q4hr, PRN: Fever/Pain, Mild to Moderate, 0 Refill(s) ; Ordering Provider:   Susette Racer MD, Collene Leyden; Catalog Code:   acetaminophen ; Order Dt/Tm:   07/22/2017 08:43:08           lisinopril  :   lisinopril ; Status:   Documented ; Ordered As Mnemonic:   lisinopril 5 mg oral tablet ; Simple Display Line:   5 mg, 1 tab(s), PO, Daily, 0 Refill(s) ; Ordering Provider:   Susette Racer MD, Collene Leyden; Catalog Code:   lisinopril ; Order Dt/Tm:   07/22/2017 08:43:40           aspirin  :   aspirin ; Status:   Documented ; Ordered As Mnemonic:   aspirin ; Simple Display Line:   81 mg, Chewed, Daily, 0 Refill(s) ; Catalog Code:   aspirin ; Order Dt/Tm:   07/18/2017 08:40:53            gabapentin  :   gabapentin ; Status:   Documented ; Ordered As Mnemonic:   gabapentin 300 mg oral capsule ; Simple Display Line:   300 mg, 1 cap(s), PO, BID, 0 Refill(s) ; Catalog Code:   gabapentin ; Order Dt/Tm:   07/12/2017 18:01:00           ALPRAZolam  :   ALPRAZolam ; Status:   Documented ; Ordered As Mnemonic:   Xanax ; Simple Display Line:   See Instructions, 0.25 mg PO TID PRN, PRN: as needed for anxiety, 0 Refill(s) ; Catalog Code:   ALPRAZolam ; Order Dt/Tm:   09/07/2015 07:07:23             Social History   Current Smoking Status :   Former smoker   Did the patient smoke (past 12 months) :   No tobacco smoking   Patient used other tobacco products in the last 30 days? :   No   Nurse, adult, Beth - 06/23/2018 15:11    Social History   (As Of: 06/23/2018 15:14:11 )   Tobacco:        Former smoker   (Last Updated: 10/19/2015 07:51:16  by Konrad Penta RN, Johnny Bridge)          Alcohol:        None   (Last Updated: 10/19/2015 07:51:23  by Konrad Penta RN, Johnny Bridge)          Substance Use:        Never   (Last Updated: 10/19/2015 07:51:50  by Konrad Penta RN, Johnny Bridge)          Home/Environment:        Lives with Children.   Comments:  08/14/2017 11:49 - Margaretha Glassing RN, [name]: lives with daughter and brother has 7 children   (Last Updated: 08/14/2017 11:49:19  by Margaretha Glassing RN, [name])          Nutrition:        Regular   (Last Updated: 08/14/2017 11:41:02  by Margaretha Glassing RN, [name])          Employment/School:        Retired   (Last Updated: 08/14/2017 11:40:49  by Margaretha Glassing RN, [name])            ONC Nutrition   MST Patient Able to Complete Assessment :   Yes   MST Score :   0    MST Lose Weight Without Trying :   No  Mody RN, Beth - 06/23/2018 15:11

## 2018-06-23 NOTE — Other (Deleted)
 [email or web address]-[email or web address] Intake Entered On:  08/18/2018 15:58     Performed On:  08/18/2018 15:57  by Kendal Hymen               General Information - Med-Onc   Fall Risk Assessment :   No   Have you been hospitalized since your last visit? :   No   Pain Symptoms :   Yes   Have you had any tests SLV :   No   VAD Monthly Flush :   No   Kendal Hymen - 08/18/2018 15:57    Infectious Disease Risk Screening   Chills :   No   Fever :   No   Fatigue :   No   Headache :   No   Runny or Stuffy Nose :   No   Sore Throat :   No   Shortness of Breath :   No   New or Worsening Cough :   No   Vomiting :   No   Diarrhea :   No   Muscle Pain :   No   Recent Exposure to Communicable Disease :   No   Illness With Generalized Rash :   No   Kendal Hymen - 08/18/2018 15:57    Patient Travel History :   No recent travel   Recent Family Travel History :   No recent travel   Close contact w/ suspect case COVID-19 :   No   Close contact w/ lab-confirmed case COVID-19 :   No   Lab confirmed positive COVID-19 test result? :   No   Kendal Hymen - 08/18/2018 15:57    Medication History   MAO Inhibitor in past 3 weeks :   No   Kendal Hymen - 08/18/2018 15:57    Medication List   (As Of: 08/18/2018 15:58:04 )   Normal Order    denosumab 120 mg/1.7 mL  :   denosumab 120 mg/1.7 mL ; Status:   Completed ; Ordered As Mnemonic:   Xgeva ; Simple Display Line:   120 mg, 1.7 mL, sc, Once ; Ordering Provider:   Kem Kays MD, Mendel Corning; Catalog Code:   denosumab ; Order Dt/Tm:   08/18/2018 15:17:59  ; Comment:   q4week treatment            Prescription/Discharge Order    potassium phosphate-sodium phosphate  :   potassium phosphate-sodium phosphate ; Status:   Prescribed ; Ordered As Mnemonic:   Phospha 250 Neutral oral tablet ; Simple Display Line:   See Instructions, 1 tab twice a day for 3 days, 6 tab(s), 0 Refill(s) ; Ordering Provider:   Lorraine Lax NP, [place] Citizen; Catalog Code:   potassium phosphate-sodium phosphate ; Order Dt/Tm:   12/09/2017  16:32:48             Home Meds    calcium carbonate  :   calcium carbonate ; Status:   Documented ; Ordered As Mnemonic:   calcium (as carbonate) 600 mg oral tablet ; Simple Display Line:   600 mg, 1 tab(s), PO, Daily, 0 Refill(s) ; Catalog Code:   calcium carbonate ; Order Dt/Tm:   10/02/2017 17:18:41           oxyCODONE  :   oxyCODONE ; Status:   Documented ; Ordered As Mnemonic:   oxyCODONE ; Simple Display Line:   5 mg, PO, as needed, 0 Refill(s) ; Catalog Code:   oxyCODONE ; Order Dt/Tm:  09/29/2017 15:24:05           cholecalciferol  :   cholecalciferol ; Status:   Documented ; Ordered As Mnemonic:   Vitamin D3 ; Simple Display Line:   1,000 Int_Unit, PO, Daily, 0 Refill(s) ; Catalog Code:   cholecalciferol ; Order Dt/Tm:   09/04/2017 14:19:46           codeine-guaifenesin  :   codeine-guaifenesin ; Status:   Documented ; Ordered As Mnemonic:   Robitussin AC ; Simple Display Line:   10 mL, PO, q4hr, 10ml twice a day as needed, 0 Refill(s) ; Catalog Code:   codeine-guaifenesin ; Order Dt/Tm:   08/14/2017 11:47:36           polyethylene glycol 3350  :   polyethylene glycol 3350 ; Status:   Documented ; Ordered As Mnemonic:   MiraLax oral powder for reconstitution ; Simple Display Line:   17 Gm, PO, Daily, PRN: Constipation, 0 Refill(s) ; Catalog Code:   polyethylene glycol 3350 ; Order Dt/Tm:   08/14/2017 11:47:11           ondansetron  :   ondansetron ; Status:   Documented ; Ordered As Mnemonic:   Zofran 4 mg oral tablet ; Simple Display Line:   See Instructions, 1 tab(s) PO q8hr PRN, 0 Refill(s) ; Catalog Code:   ondansetron ; Order Dt/Tm:   08/14/2017 11:46:16           omeprazole  :   omeprazole ; Status:   Documented ; Ordered As Mnemonic:   omeprazole ; Simple Display Line:   20 mg, PO, Daily, 0 Refill(s) ; Catalog Code:   omeprazole ; Order Dt/Tm:   08/14/2017 11:45:57           fentaNYL  :   fentaNYL ; Status:   Documented ; Ordered As Mnemonic:   fentaNYL ; Simple Display Line:   25 mcg, q72hr, change  q 3 days fentayl 25 mgPartial fill upon request, 0 Refill(s) ; Catalog Code:   fentaNYL ; Order Dt/Tm:   08/14/2017 11:39:32           loperamide  :   loperamide ; Status:   Documented ; Ordered As Mnemonic:   loperamide 2 mg oral tablet ; Simple Display Line:   2 mg, 1 tab(s), PO, q6hr, as needed, 0 Refill(s) ; Catalog Code:   loperamide ; Order Dt/Tm:   08/14/2017 11:38:26           acetaminophen  :   acetaminophen ; Status:   Documented ; Ordered As Mnemonic:   acetaminophen 325 mg oral tablet ; Simple Display Line:   650 mg, 2 tab(s), PO, q4hr, PRN: Fever/Pain, Mild to Moderate, 0 Refill(s) ; Ordering Provider:   Susette Racer MD, Collene Leyden; Catalog Code:   acetaminophen ; Order Dt/Tm:   07/22/2017 08:43:08           Al hydroxide/Mg hydroxide/simethicone  :   Al hydroxide/Mg hydroxide/simethicone ; Status:   Documented ; Ordered As Mnemonic:   aluminum hydroxide/magnesium hydroxide/simethicone 200 mg-200 mg-20 mg/5 mL oral suspension ; Simple Display Line:   30 mL, PO, q4hr, PRN: Dyspepsia, 0 Refill(s) ; Ordering Provider:   Susette Racer MD, Collene Leyden; Catalog Code:   Al hydroxide/Mg hydroxide/simethicone ; Order Dt/Tm:   07/22/2017 08:43:18           docusate  :   docusate ; Status:   Documented ; Ordered As Mnemonic:   docusate sodium 100 mg oral  capsule ; Simple Display Line:   100 mg, 1 cap(s), PO, BID, PRN: Constipation, 0 Refill(s) ; Ordering Provider:   Susette Racer MD, Collene Leyden; Catalog Code:   docusate ; Order Dt/Tm:   07/22/2017 08:43:25           lisinopril  :   lisinopril ; Status:   Documented ; Ordered As Mnemonic:   lisinopril 5 mg oral tablet ; Simple Display Line:   5 mg, 1 tab(s), PO, Daily, 0 Refill(s) ; Ordering Provider:   Susette Racer MD, Collene Leyden; Catalog Code:   lisinopril ; Order Dt/Tm:   07/22/2017 08:43:40           aspirin  :   aspirin ; Status:   Documented ; Ordered As Mnemonic:   aspirin ; Simple Display Line:   81 mg, Chewed, Daily, 0 Refill(s) ; Catalog Code:   aspirin ; Order Dt/Tm:   07/18/2017 08:40:53            gabapentin  :   gabapentin ; Status:   Documented ; Ordered As Mnemonic:   gabapentin 300 mg oral capsule ; Simple Display Line:   300 mg, 1 cap(s), PO, BID, 0 Refill(s) ; Catalog Code:   gabapentin ; Order Dt/Tm:   07/12/2017 18:01:00           ALPRAZolam  :   ALPRAZolam ; Status:   Documented ; Ordered As Mnemonic:   Xanax ; Simple Display Line:   See Instructions, 0.25 mg PO TID PRN, PRN: as needed for anxiety, 0 Refill(s) ; Catalog Code:   ALPRAZolam ; Order Dt/Tm:   09/07/2015 07:07:23             Social History   Current Smoking Status :   Former smoker   Did the patient smoke (past 12 months) :   No tobacco smoking   Patient used other tobacco products in the last 30 days? :   No   Kendal Hymen - 08/18/2018 15:57    Social History   (As Of: 08/18/2018 15:58:04 )   Tobacco:        Former smoker   (Last Updated: 10/19/2015 07:51:16  by Konrad Penta RN, Johnny Bridge)          Alcohol:        None   (Last Updated: 10/19/2015 07:51:23  by Konrad Penta RN, Johnny Bridge)          Substance Use:        Never   (Last Updated: 10/19/2015 07:51:50  by Konrad Penta RN, Johnny Bridge)          Home/Environment:        Lives with Children.   Comments:  08/14/2017 11:49 - Margaretha Glassing RN, [name]: lives with daughter and brother has 7 children   (Last Updated: 08/14/2017 11:49:19  by Margaretha Glassing RN, [name])          Nutrition:        Regular   (Last Updated: 08/14/2017 11:41:02  by Margaretha Glassing RN, [name])          Employment/School:        Retired   (Last Updated: 08/14/2017 11:40:49  by Margaretha Glassing RN, [name])            ONC Nutrition   MST Patient Able to Complete Assessment :   Yes   MST Score :   0    MST Lose Weight Without Trying :   No   Kendal Hymen - 08/18/2018 15:57    Primary Pain  Primary Pain Location :   Mid Back   Pain Scale Used :   0-10   Pain Score :   5    Primary Pain Interventions :   No interventions at this time   Kendal Hymen - 08/18/2018 15:57    Image 1 -  Images currently included in the form version of this document have not been included in the  text rendition version of the form.

## 2018-06-23 NOTE — Other (Deleted)
 [email or web address]-Onc Intake Entered On:  07/21/2018 16:01     Performed On:  07/21/2018 15:59  by Esmond Harps RN, Lawson Fiscal               General Information - Med-Onc   Preferred Language :   [place]   Fall Risk Assessment :   No   Have you been hospitalized since your last visit? :   No   Pain Symptoms :   No   Have you had any tests SLV :   No   VAD Monthly Flush :   No   Cancer Fatigue Scale :   2-Mild fatigue   Cancer Fatigue Score :   2    Brickey RN, Lori - 07/21/2018 15:59    Infectious Disease Risk Screening   Chills :   No   Fever :   No   Fatigue :   No   Headache :   No   Runny or Stuffy Nose :   No   Sore Throat :   No   Shortness of Breath :   No   New or Worsening Cough :   No   Vomiting :   No   Diarrhea :   No   Muscle Pain :   No   Recent Exposure to Communicable Disease :   No   Illness With Generalized Rash :   No   Brickey RN, Lawson Fiscal - 07/21/2018 15:59    Patient Travel History :   No recent travel   Recent Family Travel History :   No recent travel   Close contact w/ suspect case COVID-19 :   No   Close contact w/ lab-confirmed case COVID-19 :   No   Lab confirmed positive COVID-19 test result? :   No   Esmond Harps RN, Lori - 07/21/2018 15:59    Medication History   MAO Inhibitor in past 3 weeks :   Unknown   Brickey RN, Lori - 07/21/2018 15:59    Medication List   (As Of: 07/21/2018 16:01:20 )   Normal Order    denosumab 120 mg/1.7 mL  :   denosumab 120 mg/1.7 mL ; Status:   Ordered ; Ordered As Mnemonic:   Xgeva ; Simple Display Line:   120 mg, 1.7 mL, sc, Once ; Ordering Provider:   Kem Kays MD, Mendel Corning; Catalog Code:   denosumab ; Order Dt/Tm:   07/21/2018 15:55:24  ; Comment:   q4week treatment            Prescription/Discharge Order    potassium phosphate-sodium phosphate  :   potassium phosphate-sodium phosphate ; Status:   Prescribed ; Ordered As Mnemonic:   Phospha 250 Neutral oral tablet ; Simple Display Line:   See Instructions, 1 tab twice a day for 3 days, 6 tab(s), 0 Refill(s) ; Ordering Provider:    Lorraine Lax NP, Ray[place] Citizen; Catalog Code:   potassium phosphate-sodium phosphate ; Order Dt/Tm:   12/09/2017 16:32:48             Home Meds    calcium carbonate  :   calcium carbonate ; Status:   Documented ; Ordered As Mnemonic:   calcium (as carbonate) 600 mg oral tablet ; Simple Display Line:   600 mg, 1 tab(s), PO, Daily, 0 Refill(s) ; Catalog Code:   calcium carbonate ; Order Dt/Tm:   10/02/2017 17:18:41           oxyCODONE  :   oxyCODONE ; Status:  Documented ; Ordered As Mnemonic:   oxyCODONE ; Simple Display Line:   5 mg, PO, as needed, 0 Refill(s) ; Catalog Code:   oxyCODONE ; Order Dt/Tm:   09/29/2017 15:24:05           cholecalciferol  :   cholecalciferol ; Status:   Documented ; Ordered As Mnemonic:   Vitamin D3 ; Simple Display Line:   1,000 Int_Unit, PO, Daily, 0 Refill(s) ; Catalog Code:   cholecalciferol ; Order Dt/Tm:   09/04/2017 14:19:46           codeine-guaifenesin  :   codeine-guaifenesin ; Status:   Documented ; Ordered As Mnemonic:   Robitussin AC ; Simple Display Line:   10 mL, PO, q4hr, 10ml twice a day as needed, 0 Refill(s) ; Catalog Code:   codeine-guaifenesin ; Order Dt/Tm:   08/14/2017 11:47:36           polyethylene glycol 3350  :   polyethylene glycol 3350 ; Status:   Documented ; Ordered As Mnemonic:   MiraLax oral powder for reconstitution ; Simple Display Line:   17 Gm, PO, Daily, PRN: Constipation, 0 Refill(s) ; Catalog Code:   polyethylene glycol 3350 ; Order Dt/Tm:   08/14/2017 11:47:11           ondansetron  :   ondansetron ; Status:   Documented ; Ordered As Mnemonic:   Zofran 4 mg oral tablet ; Simple Display Line:   See Instructions, 1 tab(s) PO q8hr PRN, 0 Refill(s) ; Catalog Code:   ondansetron ; Order Dt/Tm:   08/14/2017 11:46:16           omeprazole  :   omeprazole ; Status:   Documented ; Ordered As Mnemonic:   omeprazole ; Simple Display Line:   20 mg, PO, Daily, 0 Refill(s) ; Catalog Code:   omeprazole ; Order Dt/Tm:   08/14/2017 11:45:57           fentaNYL  :   fentaNYL ;  Status:   Documented ; Ordered As Mnemonic:   fentaNYL ; Simple Display Line:   25 mcg, q72hr, change q 3 days fentayl 25 mgPartial fill upon request, 0 Refill(s) ; Catalog Code:   fentaNYL ; Order Dt/Tm:   08/14/2017 11:39:32           loperamide  :   loperamide ; Status:   Documented ; Ordered As Mnemonic:   loperamide 2 mg oral tablet ; Simple Display Line:   2 mg, 1 tab(s), PO, q6hr, as needed, 0 Refill(s) ; Catalog Code:   loperamide ; Order Dt/Tm:   08/14/2017 11:38:26           acetaminophen  :   acetaminophen ; Status:   Documented ; Ordered As Mnemonic:   acetaminophen 325 mg oral tablet ; Simple Display Line:   650 mg, 2 tab(s), PO, q4hr, PRN: Fever/Pain, Mild to Moderate, 0 Refill(s) ; Ordering Provider:   Susette Racer MD, Collene Leyden; Catalog Code:   acetaminophen ; Order Dt/Tm:   07/22/2017 08:43:08           Al hydroxide/Mg hydroxide/simethicone  :   Al hydroxide/Mg hydroxide/simethicone ; Status:   Documented ; Ordered As Mnemonic:   aluminum hydroxide/magnesium hydroxide/simethicone 200 mg-200 mg-20 mg/5 mL oral suspension ; Simple Display Line:   30 mL, PO, q4hr, PRN: Dyspepsia, 0 Refill(s) ; Ordering Provider:   Susette Racer MD, Collene Leyden; Catalog Code:   Al hydroxide/Mg hydroxide/simethicone ; Order Dt/Tm:   07/22/2017 08:43:18  docusate  :   docusate ; Status:   Documented ; Ordered As Mnemonic:   docusate sodium 100 mg oral capsule ; Simple Display Line:   100 mg, 1 cap(s), PO, BID, PRN: Constipation, 0 Refill(s) ; Ordering Provider:   Susette Racer MD, Collene Leyden; Catalog Code:   docusate ; Order Dt/Tm:   07/22/2017 08:43:25           lisinopril  :   lisinopril ; Status:   Documented ; Ordered As Mnemonic:   lisinopril 5 mg oral tablet ; Simple Display Line:   5 mg, 1 tab(s), PO, Daily, 0 Refill(s) ; Ordering Provider:   Susette Racer MD, Collene Leyden; Catalog Code:   lisinopril ; Order Dt/Tm:   07/22/2017 08:43:40           aspirin  :   aspirin ; Status:   Documented ; Ordered As Mnemonic:   aspirin ; Simple Display  Line:   81 mg, Chewed, Daily, 0 Refill(s) ; Catalog Code:   aspirin ; Order Dt/Tm:   07/18/2017 08:40:53           gabapentin  :   gabapentin ; Status:   Documented ; Ordered As Mnemonic:   gabapentin 300 mg oral capsule ; Simple Display Line:   300 mg, 1 cap(s), PO, BID, 0 Refill(s) ; Catalog Code:   gabapentin ; Order Dt/Tm:   07/12/2017 18:01:00           ALPRAZolam  :   ALPRAZolam ; Status:   Documented ; Ordered As Mnemonic:   Xanax ; Simple Display Line:   See Instructions, 0.25 mg PO TID PRN, PRN: as needed for anxiety, 0 Refill(s) ; Catalog Code:   ALPRAZolam ; Order Dt/Tm:   09/07/2015 07:07:23             Social History   Current Smoking Status :   Former smoker   Did the patient smoke (past 12 months) :   No tobacco smoking   Patient used other tobacco products in the last 30 days? :   No   Environmental education officer - 07/21/2018 15:59    Social History   (As Of: 07/21/2018 16:01:20 )   Tobacco:        Former smoker   (Last Updated: 10/19/2015 07:51:16  by Konrad Penta RN, Johnny Bridge)          Alcohol:        None   (Last Updated: 10/19/2015 07:51:23  by Konrad Penta RN, Johnny Bridge)          Substance Use:        Never   (Last Updated: 10/19/2015 07:51:50  by Konrad Penta RN, Johnny Bridge)          Home/Environment:        Lives with Children.   Comments:  08/14/2017 11:49 - Margaretha Glassing RN, [name]: lives with daughter and brother has 7 children   (Last Updated: 08/14/2017 11:49:19  by Margaretha Glassing RN, [name])          Nutrition:        Regular   (Last Updated: 08/14/2017 11:41:02  by Margaretha Glassing RN, [name])          Employment/School:        Retired   (Last Updated: 08/14/2017 11:40:49  by Margaretha Glassing RN, [name])            ONC Nutrition   MST Patient Able to Complete Assessment :   Yes   MST Score :   0  MST Lose Weight Without Trying :   No   Esmond Harps RN, Lawson Fiscal - 07/21/2018 15:59

## 2018-06-23 NOTE — Other (Addendum)
 Med-Onc Intake Entered On:  08/18/2018 15:58     Performed On:  08/18/2018 15:57  by Kendal Hymen               General Information - Med-Onc   Fall Risk Assessment :   No   Have you been hospitalized since your last visit? :   No   Pain Symptoms :   Yes   Have you had any tests SLV :   No   VAD Monthly Flush :   No   Kendal Hymen - 08/18/2018 15:57    Infectious Disease Risk Screening   Chills :   No   Fever :   No   Fatigue :   No   Headache :   No   Runny or Stuffy Nose :   No   Sore Throat :   No   Shortness of Breath :   No   New or Worsening Cough :   No   Vomiting :   No   Diarrhea :   No   Muscle Pain :   No   Recent Exposure to Communicable Disease :   No   Illness With Generalized Rash :   No   Kendal Hymen - 08/18/2018 15:57    Patient Travel History :   No recent travel   Recent Family Travel History :   No recent travel   Close contact w/ suspect case COVID-19 :   No   Close contact w/ lab-confirmed case COVID-19 :   No   Lab confirmed positive COVID-19 test result? :   No   Kendal Hymen - 08/18/2018 15:57    Medication History   MAO Inhibitor in past 3 weeks :   No   Kendal Hymen - 08/18/2018 15:57    Medication List   (As Of: 08/18/2018 15:58:04 )   Normal Order    denosumab 120 mg/1.7 mL  :   denosumab 120 mg/1.7 mL ; Status:   Completed ; Ordered As Mnemonic:   Xgeva ; Simple Display Line:   120 mg, 1.7 mL, sc, Once ; Ordering Provider:   Kem Kays MD, Mendel Corning; Catalog Code:   denosumab ; Order Dt/Tm:   08/18/2018 15:17:59  ; Comment:   q4week treatment            Prescription/Discharge Order    potassium phosphate-sodium phosphate  :   potassium phosphate-sodium phosphate ; Status:   Prescribed ; Ordered As Mnemonic:   Phospha 250 Neutral oral tablet ; Simple Display Line:   See Instructions, 1 tab twice a day for 3 days, 6 tab(s), 0 Refill(s) ; Ordering Provider:   Lorraine Lax NP, [place] Citizen; Catalog Code:   potassium phosphate-sodium phosphate ; Order Dt/Tm:   12/09/2017 16:32:48              Home Meds    calcium carbonate  :   calcium carbonate ; Status:   Documented ; Ordered As Mnemonic:   calcium (as carbonate) 600 mg oral tablet ; Simple Display Line:   600 mg, 1 tab(s), PO, Daily, 0 Refill(s) ; Catalog Code:   calcium carbonate ; Order Dt/Tm:   10/02/2017 17:18:41           oxyCODONE  :   oxyCODONE ; Status:   Documented ; Ordered As Mnemonic:   oxyCODONE ; Simple Display Line:   5 mg, PO, as needed, 0 Refill(s) ; Catalog Code:   oxyCODONE ; Order Dt/Tm:  09/29/2017 15:24:05           cholecalciferol  :   cholecalciferol ; Status:   Documented ; Ordered As Mnemonic:   Vitamin D3 ; Simple Display Line:   1,000 Int_Unit, PO, Daily, 0 Refill(s) ; Catalog Code:   cholecalciferol ; Order Dt/Tm:   09/04/2017 14:19:46           codeine-guaifenesin  :   codeine-guaifenesin ; Status:   Documented ; Ordered As Mnemonic:   Robitussin AC ; Simple Display Line:   10 mL, PO, q4hr, 10ml twice a day as needed, 0 Refill(s) ; Catalog Code:   codeine-guaifenesin ; Order Dt/Tm:   08/14/2017 11:47:36           polyethylene glycol 3350  :   polyethylene glycol 3350 ; Status:   Documented ; Ordered As Mnemonic:   MiraLax oral powder for reconstitution ; Simple Display Line:   17 Gm, PO, Daily, PRN: Constipation, 0 Refill(s) ; Catalog Code:   polyethylene glycol 3350 ; Order Dt/Tm:   08/14/2017 11:47:11           ondansetron  :   ondansetron ; Status:   Documented ; Ordered As Mnemonic:   Zofran 4 mg oral tablet ; Simple Display Line:   See Instructions, 1 tab(s) PO q8hr PRN, 0 Refill(s) ; Catalog Code:   ondansetron ; Order Dt/Tm:   08/14/2017 11:46:16           omeprazole  :   omeprazole ; Status:   Documented ; Ordered As Mnemonic:   omeprazole ; Simple Display Line:   20 mg, PO, Daily, 0 Refill(s) ; Catalog Code:   omeprazole ; Order Dt/Tm:   08/14/2017 11:45:57           fentaNYL  :   fentaNYL ; Status:   Documented ; Ordered As Mnemonic:   fentaNYL ; Simple Display Line:   25 mcg, q72hr, change q 3 days  fentayl 25 mgPartial fill upon request, 0 Refill(s) ; Catalog Code:   fentaNYL ; Order Dt/Tm:   08/14/2017 11:39:32           loperamide  :   loperamide ; Status:   Documented ; Ordered As Mnemonic:   loperamide 2 mg oral tablet ; Simple Display Line:   2 mg, 1 tab(s), PO, q6hr, as needed, 0 Refill(s) ; Catalog Code:   loperamide ; Order Dt/Tm:   08/14/2017 11:38:26           acetaminophen  :   acetaminophen ; Status:   Documented ; Ordered As Mnemonic:   acetaminophen 325 mg oral tablet ; Simple Display Line:   650 mg, 2 tab(s), PO, q4hr, PRN: Fever/Pain, Mild to Moderate, 0 Refill(s) ; Ordering Provider:   Susette Racer MD, Collene Leyden; Catalog Code:   acetaminophen ; Order Dt/Tm:   07/22/2017 08:43:08           Al hydroxide/Mg hydroxide/simethicone  :   Al hydroxide/Mg hydroxide/simethicone ; Status:   Documented ; Ordered As Mnemonic:   aluminum hydroxide/magnesium hydroxide/simethicone 200 mg-200 mg-20 mg/5 mL oral suspension ; Simple Display Line:   30 mL, PO, q4hr, PRN: Dyspepsia, 0 Refill(s) ; Ordering Provider:   Susette Racer MD, Collene Leyden; Catalog Code:   Al hydroxide/Mg hydroxide/simethicone ; Order Dt/Tm:   07/22/2017 08:43:18           docusate  :   docusate ; Status:   Documented ; Ordered As Mnemonic:   docusate sodium 100 mg oral  capsule ; Simple Display Line:   100 mg, 1 cap(s), PO, BID, PRN: Constipation, 0 Refill(s) ; Ordering Provider:   Susette Racer MD, Collene Leyden; Catalog Code:   docusate ; Order Dt/Tm:   07/22/2017 08:43:25           lisinopril  :   lisinopril ; Status:   Documented ; Ordered As Mnemonic:   lisinopril 5 mg oral tablet ; Simple Display Line:   5 mg, 1 tab(s), PO, Daily, 0 Refill(s) ; Ordering Provider:   Susette Racer MD, Collene Leyden; Catalog Code:   lisinopril ; Order Dt/Tm:   07/22/2017 08:43:40           aspirin  :   aspirin ; Status:   Documented ; Ordered As Mnemonic:   aspirin ; Simple Display Line:   81 mg, Chewed, Daily, 0 Refill(s) ; Catalog Code:   aspirin ; Order Dt/Tm:   07/18/2017 08:40:53            gabapentin  :   gabapentin ; Status:   Documented ; Ordered As Mnemonic:   gabapentin 300 mg oral capsule ; Simple Display Line:   300 mg, 1 cap(s), PO, BID, 0 Refill(s) ; Catalog Code:   gabapentin ; Order Dt/Tm:   07/12/2017 18:01:00           ALPRAZolam  :   ALPRAZolam ; Status:   Documented ; Ordered As Mnemonic:   Xanax ; Simple Display Line:   See Instructions, 0.25 mg PO TID PRN, PRN: as needed for anxiety, 0 Refill(s) ; Catalog Code:   ALPRAZolam ; Order Dt/Tm:   09/07/2015 07:07:23             Social History   Current Smoking Status :   Former smoker   Did the patient smoke (past 12 months) :   No tobacco smoking   Patient used other tobacco products in the last 30 days? :   No   Kendal Hymen - 08/18/2018 15:57    Social History   (As Of: 08/18/2018 15:58:04 )   Tobacco:        Former smoker   (Last Updated: 10/19/2015 07:51:16  by Konrad Penta RN, [place] Bridge)          Alcohol:        None   (Last Updated: 10/19/2015 07:51:23  by Konrad Penta RN, Johnny Bridge)          Substance Use:        Never   (Last Updated: 10/19/2015 07:51:50  by Konrad Penta RN, Johnny Bridge)          Home/Environment:        Lives with Children.   Comments:  08/14/2017 11:49 - Margaretha Glassing RN, [name]: lives with daughter and brother has 7 children   (Last Updated: 08/14/2017 11:49:19  by Margaretha Glassing RN, [name])          Nutrition:        Regular   (Last Updated: 08/14/2017 11:41:02  by Margaretha Glassing RN, [name])          Employment/School:        Retired   (Last Updated: 08/14/2017 11:40:49  by Margaretha Glassing RN, [name])            ONC Nutrition   MST Patient Able to Complete Assessment :   Yes   MST Score :   0    MST Lose Weight Without Trying :   No   Kendal Hymen - 08/18/2018 15:57    Primary Pain  Primary Pain Location :   Mid Back   Pain Scale Used :   0-10   Pain Score :   5    Primary Pain Interventions :   No interventions at this time   Kendal Hymen - 08/18/2018 15:57    Image 1 -  Images currently included in the form version of this document have not been included in the text  rendition version of the form.

## 2018-06-24 LAB — HX PROSTATE SPECIFIC ANTIGEN (PSA) FREE AND
CASE NUMBER: 2020106001213
HX PSA FREE: 0.02 ng/mL
HX PSA TOTAL: <0.1

## 2018-06-25 ENCOUNTER — Telehealth: Payer: Self-pay

## 2018-06-25 NOTE — Telephone Encounter (Signed)
Received request to fax last office notes to Lynett Grimes MD with [facility] in [place]. Faxed to [phone].

## 2018-06-30 DIAGNOSIS — R5382 Chronic fatigue, unspecified: Secondary | ICD-10-CM | POA: Diagnosis not present

## 2018-06-30 DIAGNOSIS — T462X4A Poisoning by other antidysrhythmic drugs, undetermined, initial encounter: Secondary | ICD-10-CM | POA: Diagnosis not present

## 2018-06-30 DIAGNOSIS — R0989 Other specified symptoms and signs involving the circulatory and respiratory systems: Secondary | ICD-10-CM | POA: Diagnosis not present

## 2018-06-30 DIAGNOSIS — I519 Heart disease, unspecified: Secondary | ICD-10-CM | POA: Diagnosis not present

## 2018-06-30 DIAGNOSIS — I483 Typical atrial flutter: Secondary | ICD-10-CM | POA: Diagnosis not present

## 2018-07-01 ENCOUNTER — Ambulatory Visit: Admitting: Cardiovascular Disease

## 2018-07-01 DIAGNOSIS — I509 Heart failure, unspecified: Secondary | ICD-10-CM | POA: Diagnosis not present

## 2018-07-01 DIAGNOSIS — R918 Other nonspecific abnormal finding of lung field: Secondary | ICD-10-CM | POA: Diagnosis not present

## 2018-07-01 DIAGNOSIS — Z0189 Encounter for other specified special examinations: Secondary | ICD-10-CM | POA: Diagnosis not present

## 2018-07-01 DIAGNOSIS — I5043 Acute on chronic combined systolic (congestive) and diastolic (congestive) heart failure: Secondary | ICD-10-CM | POA: Diagnosis not present

## 2018-07-01 DIAGNOSIS — J9811 Atelectasis: Secondary | ICD-10-CM | POA: Diagnosis not present

## 2018-07-01 LAB — HX NT-PROBNP
CASE NUMBER: 2020114001235
HX NT-PROBNP: 459 pg/mL — NL (ref 0.0–1800.0)

## 2018-07-07 ENCOUNTER — Inpatient Hospital Stay: Payer: Medicare Other

## 2018-07-07 DIAGNOSIS — I519 Heart disease, unspecified: Secondary | ICD-10-CM | POA: Diagnosis not present

## 2018-07-07 DIAGNOSIS — R0989 Other specified symptoms and signs involving the circulatory and respiratory systems: Secondary | ICD-10-CM | POA: Diagnosis not present

## 2018-07-07 DIAGNOSIS — T462X4A Poisoning by other antidysrhythmic drugs, undetermined, initial encounter: Secondary | ICD-10-CM | POA: Diagnosis not present

## 2018-07-07 DIAGNOSIS — I5043 Acute on chronic combined systolic (congestive) and diastolic (congestive) heart failure: Secondary | ICD-10-CM | POA: Diagnosis not present

## 2018-07-07 DIAGNOSIS — I483 Typical atrial flutter: Secondary | ICD-10-CM | POA: Diagnosis not present

## 2018-07-19 DIAGNOSIS — I483 Typical atrial flutter: Secondary | ICD-10-CM | POA: Diagnosis not present

## 2018-07-19 DIAGNOSIS — I5043 Acute on chronic combined systolic (congestive) and diastolic (congestive) heart failure: Secondary | ICD-10-CM | POA: Diagnosis not present

## 2018-07-19 DIAGNOSIS — I519 Heart disease, unspecified: Secondary | ICD-10-CM | POA: Diagnosis not present

## 2018-07-19 DIAGNOSIS — T462X4A Poisoning by other antidysrhythmic drugs, undetermined, initial encounter: Secondary | ICD-10-CM | POA: Diagnosis not present

## 2018-07-19 DIAGNOSIS — R0989 Other specified symptoms and signs involving the circulatory and respiratory systems: Secondary | ICD-10-CM | POA: Diagnosis not present

## 2018-07-21 DIAGNOSIS — I10 Essential (primary) hypertension: Secondary | ICD-10-CM | POA: Diagnosis not present

## 2018-07-21 DIAGNOSIS — Z87891 Personal history of nicotine dependence: Secondary | ICD-10-CM | POA: Diagnosis not present

## 2018-07-21 DIAGNOSIS — I4892 Unspecified atrial flutter: Secondary | ICD-10-CM | POA: Diagnosis not present

## 2018-07-21 DIAGNOSIS — Z9181 History of falling: Secondary | ICD-10-CM | POA: Diagnosis not present

## 2018-07-21 DIAGNOSIS — Z0189 Encounter for other specified special examinations: Secondary | ICD-10-CM | POA: Diagnosis not present

## 2018-07-21 DIAGNOSIS — G893 Neoplasm related pain (acute) (chronic): Secondary | ICD-10-CM | POA: Diagnosis not present

## 2018-07-21 DIAGNOSIS — C7951 Secondary malignant neoplasm of bone: Secondary | ICD-10-CM | POA: Diagnosis not present

## 2018-07-21 LAB — HX ALBUMIN LEVEL
CASE NUMBER: 2020134001888
HX ALBUMIN LVL: 3.5 g/dL — NL (ref 3.2–5.0)

## 2018-07-21 LAB — HX CREATININE LEVEL
CASE NUMBER: 2020134001888
HX CREATININE: 1.51 mg/dL — ABNORMAL HIGH (ref 0.55–1.3)

## 2018-07-21 LAB — HX MAGNESIUM LEVEL
CASE NUMBER: 2020134001888
HX MAGNESIUM LVL: 2.3 mg/dL — NL (ref 1.7–2.5)

## 2018-07-21 LAB — HX POTASSIUM LEVEL
CASE NUMBER: 2020134001888
HX POTASSIUM LVL: 4.5 mmol/L — NL (ref 3.6–5.2)

## 2018-07-21 LAB — HX GLOMERULAR FILTRATION RATE (ESTIMATED)
CASE NUMBER: 2020134001888
HX AFN AMER GLOMERULAR FILTRATION RATE: 48 mL/min/{1.73_m2}
HX NON-AFN AMER GLOMERULAR FILTRATION RATE: 42 mL/min/{1.73_m2}

## 2018-07-21 LAB — HX CALCIUM LEVEL TOTAL
CASE NUMBER: 2020134001888
HX CALCIUM LVL: 9.1 mg/dL — NL (ref 8.5–10.5)

## 2018-08-04 ENCOUNTER — Inpatient Hospital Stay: Payer: Medicare Other

## 2018-08-04 ENCOUNTER — Ambulatory Visit: Payer: Medicare Other | Admitting: Oncology

## 2018-08-18 DIAGNOSIS — Z0189 Encounter for other specified special examinations: Secondary | ICD-10-CM | POA: Diagnosis not present

## 2018-08-18 DIAGNOSIS — I10 Essential (primary) hypertension: Secondary | ICD-10-CM | POA: Diagnosis not present

## 2018-08-18 DIAGNOSIS — Z9181 History of falling: Secondary | ICD-10-CM | POA: Diagnosis not present

## 2018-08-18 DIAGNOSIS — Z87891 Personal history of nicotine dependence: Secondary | ICD-10-CM | POA: Diagnosis not present

## 2018-08-18 DIAGNOSIS — I4892 Unspecified atrial flutter: Secondary | ICD-10-CM | POA: Diagnosis not present

## 2018-08-18 DIAGNOSIS — C7951 Secondary malignant neoplasm of bone: Secondary | ICD-10-CM | POA: Diagnosis not present

## 2018-08-18 DIAGNOSIS — G893 Neoplasm related pain (acute) (chronic): Secondary | ICD-10-CM | POA: Diagnosis not present

## 2018-08-18 LAB — HX ALBUMIN LEVEL
CASE NUMBER: 2020162002120
HX ALBUMIN LVL: 3.7 g/dL — NL (ref 3.2–5.0)

## 2018-08-18 LAB — HX CREATININE LEVEL
CASE NUMBER: 2020162002120
HX CREATININE: 1.67 mg/dL — ABNORMAL HIGH (ref 0.55–1.3)

## 2018-08-18 LAB — HX GLOMERULAR FILTRATION RATE (ESTIMATED)
CASE NUMBER: 2020162002120
HX AFN AMER GLOMERULAR FILTRATION RATE: 42 mL/min/{1.73_m2}
HX NON-AFN AMER GLOMERULAR FILTRATION RATE: 37 mL/min/{1.73_m2}

## 2018-08-18 LAB — HX MAGNESIUM LEVEL
CASE NUMBER: 2020162002120
HX MAGNESIUM LVL: 2.2 mg/dL — NL (ref 1.7–2.5)

## 2018-08-18 LAB — HX PHOSPHORUS LEVEL
CASE NUMBER: 19542069
HX PHOSPHORUS: 3.1 mg/dL — NL (ref 2.4–4.9)

## 2018-08-18 LAB — HX POTASSIUM LEVEL
CASE NUMBER: 2020162002120
HX POTASSIUM LVL: 4.5 mmol/L — NL (ref 3.6–5.2)

## 2018-08-18 LAB — HX CALCIUM LEVEL TOTAL
CASE NUMBER: 2020162002120
HX CALCIUM LVL: 9.3 mg/dL — NL (ref 8.5–10.5)

## 2018-08-23 ENCOUNTER — Other Ambulatory Visit: Payer: Self-pay | Admitting: Internal Medicine

## 2018-08-23 ENCOUNTER — Other Ambulatory Visit: Payer: Self-pay

## 2018-08-23 DIAGNOSIS — I10 Essential (primary) hypertension: Secondary | ICD-10-CM

## 2018-08-23 DIAGNOSIS — F419 Anxiety disorder, unspecified: Secondary | ICD-10-CM

## 2018-08-23 DIAGNOSIS — C7951 Secondary malignant neoplasm of bone: Secondary | ICD-10-CM

## 2018-08-23 DIAGNOSIS — C61 Malignant neoplasm of prostate: Secondary | ICD-10-CM

## 2018-08-23 DIAGNOSIS — R609 Edema, unspecified: Secondary | ICD-10-CM

## 2018-08-23 DIAGNOSIS — K219 Gastro-esophageal reflux disease without esophagitis: Secondary | ICD-10-CM

## 2018-08-23 MED ORDER — LOSARTAN POTASSIUM 100 MG PO TABS: ORAL_TABLET | ORAL | 1 refills | Status: DC

## 2018-08-23 MED ORDER — LABETALOL HCL 100 MG PO TABS: ORAL_TABLET | ORAL | 1 refills | Status: DC

## 2018-08-23 MED ORDER — GABAPENTIN 600 MG PO TABS: ORAL_TABLET | ORAL | 2 refills | Status: DC

## 2018-08-23 MED ORDER — BUSPIRONE HCL 10 MG PO TABS: ORAL_TABLET | ORAL | 1 refills | Status: DC

## 2018-08-23 MED ORDER — GABAPENTIN 600 MG PO TABS: ORAL_TABLET | ORAL | 1 refills | Status: DC

## 2018-08-23 MED ORDER — FUROSEMIDE 40 MG PO TABS: ORAL_TABLET | ORAL | 1 refills | Status: DC

## 2018-08-23 MED ORDER — OMEPRAZOLE 20 MG PO CPDR: DELAYED_RELEASE_CAPSULE | ORAL | 1 refills | Status: DC

## 2018-09-01 ENCOUNTER — Inpatient Hospital Stay: Payer: Medicare Other

## 2018-09-01 ENCOUNTER — Inpatient Hospital Stay: Payer: Medicare Other | Attending: Oncology

## 2018-09-06 ENCOUNTER — Telehealth: Payer: Self-pay | Admitting: Adult Health Nurse Practitioner

## 2018-09-06 NOTE — Telephone Encounter (Signed)
Called to set up appointment.  Patient not expected to be back in [place] until late next month.  Will call back in a month to see if he wants to set up an appointment. Kymani Laursen K. Olena Heckle NP

## 2018-09-08 ENCOUNTER — Ambulatory Visit: Admitting: Family

## 2018-09-08 NOTE — Other (Addendum)
 Hem-Onc [place] Intake Entered On:  09/08/2018 15:16     Performed On:  09/08/2018 15:15  by Lorraine Lax NP, [place] Citizen               Vitals/Height/Weight   Type of Weight :   Estimated weight   Type of Height :   Estimated height   Lorraine Lax NP, [place] Citizen - 09/08/2018 15:15    Allergy   (As Of: 09/08/2018 15:16:26 )   Allergies (Active)   NKA  Estimated Onset Date:   Unspecified ; Created By:   Ronnie Doss; Reaction Status:   Active ; Category:   Drug ; Substance:   NKA ; Type:   Allergy ; Updated By:   Ronnie Doss; Reviewed Date:   09/08/2018 15:15         Medication History   Medication List   (As Of: 09/08/2018 15:16:26 )   Prescription/Discharge Order    potassium phosphate-sodium phosphate  :   potassium phosphate-sodium phosphate ; Status:   Prescribed ; Ordered As Mnemonic:   Phospha 250 Neutral oral tablet ; Simple Display Line:   See Instructions, 1 tab twice a day for 3 days, 6 tab(s), 0 Refill(s) ; Ordering Provider:   Lorraine Lax NP, [place] Citizen; Catalog Code:   potassium phosphate-sodium phosphate ; Order Dt/Tm:   12/09/2017 16:32:48             Home Meds    calcium carbonate  :   calcium carbonate ; Status:   Documented ; Ordered As Mnemonic:   calcium (as carbonate) 600 mg oral tablet ; Simple Display Line:   600 mg, 1 tab(s), PO, Daily, 0 Refill(s) ; Catalog Code:   calcium carbonate ; Order Dt/Tm:   10/02/2017 17:18:41           oxyCODONE  :   oxyCODONE ; Status:   Documented ; Ordered As Mnemonic:   oxyCODONE ; Simple Display Line:   5 mg, PO, as needed, 0 Refill(s) ; Catalog Code:   oxyCODONE ; Order Dt/Tm:   09/29/2017 15:24:05           cholecalciferol  :   cholecalciferol ; Status:   Documented ; Ordered As Mnemonic:   Vitamin D3 ; Simple Display Line:   1,000 Int_Unit, PO, Daily, 0 Refill(s) ; Catalog Code:   cholecalciferol ; Order Dt/Tm:   09/04/2017 14:19:46           codeine-guaifenesin  :   codeine-guaifenesin ; Status:   Documented ; Ordered As Mnemonic:   Robitussin AC ; Simple Display Line:   10 mL,  PO, q4hr, 10ml twice a day as needed, 0 Refill(s) ; Catalog Code:   codeine-guaifenesin ; Order Dt/Tm:   08/14/2017 11:47:36           polyethylene glycol 3350  :   polyethylene glycol 3350 ; Status:   Documented ; Ordered As Mnemonic:   MiraLax oral powder for reconstitution ; Simple Display Line:   17 Gm, PO, Daily, PRN: Constipation, 0 Refill(s) ; Catalog Code:   polyethylene glycol 3350 ; Order Dt/Tm:   08/14/2017 11:47:11           ondansetron  :   ondansetron ; Status:   Documented ; Ordered As Mnemonic:   Zofran 4 mg oral tablet ; Simple Display Line:   See Instructions, 1 tab(s) PO q8hr PRN, 0 Refill(s) ; Catalog Code:   ondansetron ; Order Dt/Tm:   08/14/2017 11:46:16  omeprazole  :   omeprazole ; Status:   Documented ; Ordered As Mnemonic:   omeprazole ; Simple Display Line:   20 mg, PO, Daily, 0 Refill(s) ; Catalog Code:   omeprazole ; Order Dt/Tm:   08/14/2017 11:45:57           fentaNYL  :   fentaNYL ; Status:   Documented ; Ordered As Mnemonic:   fentaNYL ; Simple Display Line:   25 mcg, q72hr, change q 3 days fentayl 25 mgPartial fill upon request, 0 Refill(s) ; Catalog Code:   fentaNYL ; Order Dt/Tm:   08/14/2017 11:39:32           loperamide  :   loperamide ; Status:   Documented ; Ordered As Mnemonic:   loperamide 2 mg oral tablet ; Simple Display Line:   2 mg, 1 tab(s), PO, q6hr, as needed, 0 Refill(s) ; Catalog Code:   loperamide ; Order Dt/Tm:   08/14/2017 11:38:26           acetaminophen  :   acetaminophen ; Status:   Documented ; Ordered As Mnemonic:   acetaminophen 325 mg oral tablet ; Simple Display Line:   650 mg, 2 tab(s), PO, q4hr, PRN: Fever/Pain, Mild to Moderate, 0 Refill(s) ; Ordering Provider:   Susette Racer MD, Collene Leyden; Catalog Code:   acetaminophen ; Order Dt/Tm:   07/22/2017 08:43:08           Al hydroxide/Mg hydroxide/simethicone  :   Al hydroxide/Mg hydroxide/simethicone ; Status:   Documented ; Ordered As Mnemonic:   aluminum hydroxide/magnesium hydroxide/simethicone 200  mg-200 mg-20 mg/5 mL oral suspension ; Simple Display Line:   30 mL, PO, q4hr, PRN: Dyspepsia, 0 Refill(s) ; Ordering Provider:   Susette Racer MD, Collene Leyden; Catalog Code:   Al hydroxide/Mg hydroxide/simethicone ; Order Dt/Tm:   07/22/2017 08:43:18           docusate  :   docusate ; Status:   Documented ; Ordered As Mnemonic:   docusate sodium 100 mg oral capsule ; Simple Display Line:   100 mg, 1 cap(s), PO, BID, PRN: Constipation, 0 Refill(s) ; Ordering Provider:   Susette Racer MD, Collene Leyden; Catalog Code:   docusate ; Order Dt/Tm:   07/22/2017 08:43:25           lisinopril  :   lisinopril ; Status:   Documented ; Ordered As Mnemonic:   lisinopril 5 mg oral tablet ; Simple Display Line:   5 mg, 1 tab(s), PO, Daily, 0 Refill(s) ; Ordering Provider:   Susette Racer MD, Collene Leyden; Catalog Code:   lisinopril ; Order Dt/Tm:   07/22/2017 08:43:40           aspirin  :   aspirin ; Status:   Documented ; Ordered As Mnemonic:   aspirin ; Simple Display Line:   81 mg, Chewed, Daily, 0 Refill(s) ; Catalog Code:   aspirin ; Order Dt/Tm:   07/18/2017 08:40:53           gabapentin  :   gabapentin ; Status:   Documented ; Ordered As Mnemonic:   gabapentin 300 mg oral capsule ; Simple Display Line:   300 mg, 1 cap(s), PO, BID, 0 Refill(s) ; Catalog Code:   gabapentin ; Order Dt/Tm:   07/12/2017 18:01:00           ALPRAZolam  :   ALPRAZolam ; Status:   Documented ; Ordered As Mnemonic:   Xanax ; Simple Display Line:  See Instructions, 0.25 mg PO TID PRN, PRN: as needed for anxiety, 0 Refill(s) ; Catalog Code:   ALPRAZolam ; Order Dt/Tm:   09/07/2015 07:07:23             Social History   Current Smoking Status :   Former smoker   Did the patient smoke (past 12 months) :   No tobacco smoking   Patient used other tobacco products in the last 30 days? :   No   Karole Oo NP, [place] Citizen - 09/08/2018 15:15    Social History   (As Of: 09/08/2018 15:16:26 )     General Information   Chief Complaint :   f/u prostate cancer   Preferred Language :   English   Preferred  Communication Mode :   Verbal   High Risk for Falls :   No   Alazne Quant NP, [place] Citizen - 09/08/2018 15:15    Patient Preferred Method of Communication   Phone Call   Infectious Disease Risk Screening   Patient Travel History :   No recent travel   Recent Family Travel History :   No recent travel   Close contact w/ suspect case COVID-19 :   No   Close contact w/ lab-confirmed case COVID-19 :   No   Lab confirmed positive COVID-19 test result? :   No   Lorraine Lax NP, [place] Citizen - 09/08/2018 15:15

## 2018-09-08 NOTE — Other (Deleted)
 [email or web address] [place] Intake Entered On:  09/08/2018 15:16     Performed On:  09/08/2018 15:15  by Lorraine Lax NP, [place] Citizen               Vitals/Height/Weight   Type of Weight :   Estimated weight   Type of Height :   Estimated height   Lorraine Lax NP, [place] Citizen - 09/08/2018 15:15    Allergy   (As Of: 09/08/2018 15:16:26 )   Allergies (Active)   NKA  Estimated Onset Date:   Unspecified ; Created By:   Ronnie Doss; Reaction Status:   Active ; Category:   Drug ; Substance:   NKA ; Type:   Allergy ; Updated By:   Ronnie Doss; Reviewed Date:   09/08/2018 15:15         Medication History   Medication List   (As Of: 09/08/2018 15:16:26 )   Prescription/Discharge Order    potassium phosphate-sodium phosphate  :   potassium phosphate-sodium phosphate ; Status:   Prescribed ; Ordered As Mnemonic:   Phospha 250 Neutral oral tablet ; Simple Display Line:   See Instructions, 1 tab twice a day for 3 days, 6 tab(s), 0 Refill(s) ; Ordering Provider:   Lorraine Lax NP, [place] Citizen; Catalog Code:   potassium phosphate-sodium phosphate ; Order Dt/Tm:   12/09/2017 16:32:48             Home Meds    calcium carbonate  :   calcium carbonate ; Status:   Documented ; Ordered As Mnemonic:   calcium (as carbonate) 600 mg oral tablet ; Simple Display Line:   600 mg, 1 tab(s), PO, Daily, 0 Refill(s) ; Catalog Code:   calcium carbonate ; Order Dt/Tm:   10/02/2017 17:18:41           oxyCODONE  :   oxyCODONE ; Status:   Documented ; Ordered As Mnemonic:   oxyCODONE ; Simple Display Line:   5 mg, PO, as needed, 0 Refill(s) ; Catalog Code:   oxyCODONE ; Order Dt/Tm:   09/29/2017 15:24:05           cholecalciferol  :   cholecalciferol ; Status:   Documented ; Ordered As Mnemonic:   Vitamin D3 ; Simple Display Line:   1,000 Int_Unit, PO, Daily, 0 Refill(s) ; Catalog Code:   cholecalciferol ; Order Dt/Tm:   09/04/2017 14:19:46           codeine-guaifenesin  :   codeine-guaifenesin ; Status:   Documented ; Ordered As Mnemonic:   Robitussin AC ; Simple Display Line:    10 mL, PO, q4hr, 10ml twice a day as needed, 0 Refill(s) ; Catalog Code:   codeine-guaifenesin ; Order Dt/Tm:   08/14/2017 11:47:36           polyethylene glycol 3350  :   polyethylene glycol 3350 ; Status:   Documented ; Ordered As Mnemonic:   MiraLax oral powder for reconstitution ; Simple Display Line:   17 Gm, PO, Daily, PRN: Constipation, 0 Refill(s) ; Catalog Code:   polyethylene glycol 3350 ; Order Dt/Tm:   08/14/2017 11:47:11           ondansetron  :   ondansetron ; Status:   Documented ; Ordered As Mnemonic:   Zofran 4 mg oral tablet ; Simple Display Line:   See Instructions, 1 tab(s) PO q8hr PRN, 0 Refill(s) ; Catalog Code:   ondansetron ; Order Dt/Tm:   08/14/2017 11:46:16  omeprazole  :   omeprazole ; Status:   Documented ; Ordered As Mnemonic:   omeprazole ; Simple Display Line:   20 mg, PO, Daily, 0 Refill(s) ; Catalog Code:   omeprazole ; Order Dt/Tm:   08/14/2017 11:45:57           fentaNYL  :   fentaNYL ; Status:   Documented ; Ordered As Mnemonic:   fentaNYL ; Simple Display Line:   25 mcg, q72hr, change q 3 days fentayl 25 mgPartial fill upon request, 0 Refill(s) ; Catalog Code:   fentaNYL ; Order Dt/Tm:   08/14/2017 11:39:32           loperamide  :   loperamide ; Status:   Documented ; Ordered As Mnemonic:   loperamide 2 mg oral tablet ; Simple Display Line:   2 mg, 1 tab(s), PO, q6hr, as needed, 0 Refill(s) ; Catalog Code:   loperamide ; Order Dt/Tm:   08/14/2017 11:38:26           acetaminophen  :   acetaminophen ; Status:   Documented ; Ordered As Mnemonic:   acetaminophen 325 mg oral tablet ; Simple Display Line:   650 mg, 2 tab(s), PO, q4hr, PRN: Fever/Pain, Mild to Moderate, 0 Refill(s) ; Ordering Provider:   Susette Racer MD, Collene Leyden; Catalog Code:   acetaminophen ; Order Dt/Tm:   07/22/2017 08:43:08           Al hydroxide/Mg hydroxide/simethicone  :   Al hydroxide/Mg hydroxide/simethicone ; Status:   Documented ; Ordered As Mnemonic:   aluminum hydroxide/magnesium  hydroxide/simethicone 200 mg-200 mg-20 mg/5 mL oral suspension ; Simple Display Line:   30 mL, PO, q4hr, PRN: Dyspepsia, 0 Refill(s) ; Ordering Provider:   Susette Racer MD, Collene Leyden; Catalog Code:   Al hydroxide/Mg hydroxide/simethicone ; Order Dt/Tm:   07/22/2017 08:43:18           docusate  :   docusate ; Status:   Documented ; Ordered As Mnemonic:   docusate sodium 100 mg oral capsule ; Simple Display Line:   100 mg, 1 cap(s), PO, BID, PRN: Constipation, 0 Refill(s) ; Ordering Provider:   Susette Racer MD, Collene Leyden; Catalog Code:   docusate ; Order Dt/Tm:   07/22/2017 08:43:25           lisinopril  :   lisinopril ; Status:   Documented ; Ordered As Mnemonic:   lisinopril 5 mg oral tablet ; Simple Display Line:   5 mg, 1 tab(s), PO, Daily, 0 Refill(s) ; Ordering Provider:   Susette Racer MD, Collene Leyden; Catalog Code:   lisinopril ; Order Dt/Tm:   07/22/2017 08:43:40           aspirin  :   aspirin ; Status:   Documented ; Ordered As Mnemonic:   aspirin ; Simple Display Line:   81 mg, Chewed, Daily, 0 Refill(s) ; Catalog Code:   aspirin ; Order Dt/Tm:   07/18/2017 08:40:53           gabapentin  :   gabapentin ; Status:   Documented ; Ordered As Mnemonic:   gabapentin 300 mg oral capsule ; Simple Display Line:   300 mg, 1 cap(s), PO, BID, 0 Refill(s) ; Catalog Code:   gabapentin ; Order Dt/Tm:   07/12/2017 18:01:00           ALPRAZolam  :   ALPRAZolam ; Status:   Documented ; Ordered As Mnemonic:   Xanax ; Simple Display Line:  See Instructions, 0.25 mg PO TID PRN, PRN: as needed for anxiety, 0 Refill(s) ; Catalog Code:   ALPRAZolam ; Order Dt/Tm:   09/07/2015 07:07:23             Social History   Current Smoking Status :   Former smoker   Did the patient smoke (past 12 months) :   No tobacco smoking   Patient used other tobacco products in the last 30 days? :   No   Addysen Louth NP, [place] Citizen - 09/08/2018 15:15    Social History   (As Of: 09/08/2018 15:16:26 )     General Information   Chief Complaint :   f/u prostate cancer   Preferred  Language :   English   Preferred Communication Mode :   Verbal   High Risk for Falls :   No   Jeferson Boozer NP, [place] Citizen - 09/08/2018 15:15    Patient Preferred Method of Communication   Phone Call   Infectious Disease Risk Screening   Patient Travel History :   No recent travel   Recent Family Travel History :   No recent travel   Close contact w/ suspect case COVID-19 :   No   Close contact w/ lab-confirmed case COVID-19 :   No   Lab confirmed positive COVID-19 test result? :   No   Lorraine Lax NP, [place] Citizen - 09/08/2018 15:15

## 2018-09-08 NOTE — Other (Deleted)
 Patient:   [name], [name]            MRN: [id number]            FIN: [id number]               Age:   85 years     Sex:  Male     DOB:  Aug 30, 1931   Associated Diagnoses:   None   Author:   Lorraine Lax NP, [place] Citizen      Visit Information   Patient is seen under the supervision of Dr. Kem Kays.    "This telemedicine encounter was performed with informed consent and initiated by the patient. The author/clinician was in the office, and patient was in a remote location."         Chief Complaint   metastatic prostate cancer    Oncologic/hematologic History:  He was recently admitted to the hospital s/p fall down the stairs, resulting in a back injury.  Extensive work-up including CT head, CT spine and MRI spine was done.  It revealed evidence of metastatic disease to the back and a mild compression fracture of L2.  There was no evidence of spinal cord compression.  PSA was checked and it returned high at 245.  He was treated conservatively for his fracture and was started on pain meds.  He was then started on Lupron and Casodex, as well as Xgeva. Casodex was then discontinued and he remained on the other two. For his bony met in the back, he underwent kyphoplasty. Repeat PSA on 10/29/17 was 0.1. He then went to [facility] for the winter months.    He has continued on xgeva/lupron traveling back and forth from [place].           Interval History     Since his last visit the patient states he remains fatigued. He also notes lower back pain. He does take oxycodone at night to help with sleep. He is eating and drinking OK. He notes frequent urination at night but does take a "water" pill. Denies fevers, chills, headache, shortness of breath.        Review of Systems   Constitutional:  Fatigue, Decreased activity.    Eye:  Negative.    Ear/Nose/Mouth/Throat:  Negative.    Respiratory:  Negative.    Cardiovascular:  Negative.    Gastrointestinal:  Negative.    Genitourinary:  Negative.    Hematology/Lymphatics:  Negative.     Endocrine:  Negative.    Immunologic:  Negative.    Musculoskeletal:  Negative.    Integumentary:  Negative.    Neurologic:  Alert and oriented X4.    Psychiatric:  Negative.    All other systems are negative      Health Status   Allergies:    Allergic Reactions (Selected)  NKA,    Allergies (1) Active Reaction  NKA None Documented     Problem list:    Nursing  Resolved: Abdominal pain / 62130865  Resolved: Alteration in comfort: pain / 78469629  All Problems  Anxiety / 5284132440 / Confirmed  Atrial flutter / 1027253 / Confirmed  Back pain / 664403474 / Confirmed  SOB - Shortness of breath / 259563875 / Confirmed  Fracture of lumbar vertebrae / 643329518 / Confirmed  GERD - Gastro-esophageal reflux disease / 8416606301 / Confirmed  Hematoma / 6010932355 / Confirmed  Hyperlipidemia / 73220254 / Confirmed  HTN / 2706237628 / Confirmed  Arthritis / 3151761607 / Confirmed  Prostate cancer / 3710626948 /  Confirmed  Metastasis of malignant neoplasm to bone / 564332951 / Confirmed  Resolved: Abdominal pain / 88416606  Resolved: Alteration in comfort: pain / 30160109,    Active Problems (12)  Anxiety   Arthritis   Atrial flutter   Back pain   Fracture of lumbar vertebrae   GERD - Gastro-esophageal reflux disease   Hematoma   HTN   Hyperlipidemia   Metastasis of malignant neoplasm to bone   Prostate cancer   SOB - Shortness of breath         Histories   Past Medical History:    No active or resolved past medical history items have been selected or recorded.   Family History:    High blood pressure  Daughter  Daughter  Kidney transplant  Daughter     Procedure history:    repair wrist laceration.   Social History        Social & Psychosocial Habits    Home/Environment  08/14/2017  Lives with: Children    Comment: lives with daughter and brother has 7 children - 08/14/2017 11:49 - Margaretha Glassing RN, [name]    Nutrition  08/14/2017  Type of diet: Regular    Substance Use  10/19/2015  Use: Never    Tobacco  10/19/2015  Use: Former  smoker    Alcohol  10/19/2015  Use: None    Employment/School  08/14/2017  Status: Retired  .        Physical Examination   Measurements from flowsheet : Measurements   09/08/2018 15:15  Type of Height Estimated height    Type of Weight Estimated weight      General:  Alert and oriented, No acute distress.    Neurologic:  Alert, Oriented.    Cognition and Speech:  Oriented, Speech clear and coherent.    Psychiatric:  Cooperative, Appropriate mood & affect.       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     He has continued on this treatment and is tolerating it well. He is declining any imaging at this time. I encouraged him to take the oxycodone for his pain.     He is under the care of Dr. Oneta Rack in [place].  He will get his Lupron shot in Dr. Stevenson Clinch office. He receives his [place] in [place]. I will obtain a PSA with his other labs.      Patient is agreeable to plan.  He will return for follow-up in 4 months when an dif he returns from [facility].  He knows to call with questions/concerns anytime.                          Reviewed and electronically verified by:  Lorraine Lax NP, [place]par                                                                     on:  09/08/2018 15:22parpar Reviewed and electronically authenticated by:  Rosita Fire  par  on:  09/08/18 15:22parpar

## 2018-09-08 NOTE — Other (Addendum)
 Patient:   [name], [name]            MRN: [id number]            FIN: [id number]               Age:   85 years     Sex:  Male     DOB:  03-08-1932   Associated Diagnoses:   None   Author:   Lorraine Lax NP, [place] Citizen      Visit Information   Patient is seen under the supervision of Dr. Kem Kays.    "This telemedicine encounter was performed with informed consent and initiated by the patient. The author/clinician was in the office, and patient was in a remote location."         Chief Complaint   metastatic prostate cancer    Oncologic/hematologic History:  He was recently admitted to the hospital s/p fall down the stairs, resulting in a back injury.  Extensive work-up including CT head, CT spine and MRI spine was done.  It revealed evidence of metastatic disease to the back and a mild compression fracture of L2.  There was no evidence of spinal cord compression.  PSA was checked and it returned high at 245.  He was treated conservatively for his fracture and was started on pain meds.  He was then started on Lupron and Casodex, as well as Xgeva. Casodex was then discontinued and he remained on the other two. For his bony met in the back, he underwent kyphoplasty. Repeat PSA on 10/29/17 was 0.1. He then went to [facility] for the winter months.    He has continued on xgeva/lupron traveling back and forth from [place].           Interval History     Since his last visit the patient states he remains fatigued. He also notes lower back pain. He does take oxycodone at night to help with sleep. He is eating and drinking OK. He notes frequent urination at night but does take a "water" pill. Denies fevers, chills, headache, shortness of breath.        Review of Systems   Constitutional:  Fatigue, Decreased activity.    Eye:  Negative.    Ear/Nose/Mouth/Throat:  Negative.    Respiratory:  Negative.    Cardiovascular:  Negative.    Gastrointestinal:  Negative.    Genitourinary:  Negative.    Hematology/Lymphatics:  Negative.     Endocrine:  Negative.    Immunologic:  Negative.    Musculoskeletal:  Negative.    Integumentary:  Negative.    Neurologic:  Alert and oriented X4.    Psychiatric:  Negative.    All other systems are negative      Health Status   Allergies:    Allergic Reactions (Selected)  NKA,    Allergies (1) Active Reaction  NKA None Documented     Problem list:    Nursing  Resolved: Abdominal pain / 60630160  Resolved: Alteration in comfort: pain / 10932355  All Problems  Anxiety / 7322025427 / Confirmed  Atrial flutter / 0623762 / Confirmed  Back pain / 831517616 / Confirmed  SOB - Shortness of breath / 073710626 / Confirmed  Fracture of lumbar vertebrae / 948546270 / Confirmed  GERD - Gastro-esophageal reflux disease / 3500938182 / Confirmed  Hematoma / 9937169678 / Confirmed  Hyperlipidemia / 93810175 / Confirmed  HTN / 1025852778 / Confirmed  Arthritis / 2423536144 / Confirmed  Prostate cancer / 3154008676 /  Confirmed  Metastasis of malignant neoplasm to bone / 401027253 / Confirmed  Resolved: Abdominal pain / 66440347  Resolved: Alteration in comfort: pain / 42595638,    Active Problems (12)  Anxiety   Arthritis   Atrial flutter   Back pain   Fracture of lumbar vertebrae   GERD - Gastro-esophageal reflux disease   Hematoma   HTN   Hyperlipidemia   Metastasis of malignant neoplasm to bone   Prostate cancer   SOB - Shortness of breath         Histories   Past Medical History:    No active or resolved past medical history items have been selected or recorded.   Family History:    High blood pressure  Daughter  Daughter  Kidney transplant  Daughter     Procedure history:    repair wrist laceration.   Social History        Social & Psychosocial Habits    Home/Environment  08/14/2017  Lives with: Children    Comment: lives with daughter and brother has 7 children - 08/14/2017 11:49 - Margaretha Glassing RN, [name]    Nutrition  08/14/2017  Type of diet: Regular    Substance Use  10/19/2015  Use: Never    Tobacco  10/19/2015  Use: Former  smoker    Alcohol  10/19/2015  Use: None    Employment/School  08/14/2017  Status: Retired  .        Physical Examination   Measurements from flowsheet : Measurements   09/08/2018 15:15  Type of Height Estimated height    Type of Weight Estimated weight      General:  Alert and oriented, No acute distress.    Neurologic:  Alert, Oriented.    Cognition and Speech:  Oriented, Speech clear and coherent.    Psychiatric:  Cooperative, Appropriate mood & affect.       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     He has continued on this treatment and is tolerating it well. He is declining any imaging at this time. I encouraged him to take the oxycodone for his pain.     He is under the care of Dr. Oneta Rack in [place].  He will get his Lupron shot in Dr. Stevenson Clinch office. He receives his [place] in [place]. I will obtain a PSA with his other labs.      Patient is agreeable to plan.  He will return for follow-up in 4 months when an dif he returns from [facility].  He knows to call with questions/concerns anytime.                          Reviewed and electronically verified by:  Lorraine Lax NPR[place] Citizen                                                                       on:  09/08/2018 15:22    Reviewed and electronically authenticated by:  Rosita Fire  on:  09/08/18 15:22

## 2018-09-09 DIAGNOSIS — T462X4A Poisoning by other antidysrhythmic drugs, undetermined, initial encounter: Secondary | ICD-10-CM | POA: Diagnosis not present

## 2018-09-09 DIAGNOSIS — I519 Heart disease, unspecified: Secondary | ICD-10-CM | POA: Diagnosis not present

## 2018-09-09 DIAGNOSIS — R0989 Other specified symptoms and signs involving the circulatory and respiratory systems: Secondary | ICD-10-CM | POA: Diagnosis not present

## 2018-09-09 DIAGNOSIS — I5043 Acute on chronic combined systolic (congestive) and diastolic (congestive) heart failure: Secondary | ICD-10-CM | POA: Diagnosis not present

## 2018-09-09 DIAGNOSIS — I483 Typical atrial flutter: Secondary | ICD-10-CM | POA: Diagnosis not present

## 2018-09-15 ENCOUNTER — Ambulatory Visit: Admitting: Urology

## 2018-09-15 DIAGNOSIS — G893 Neoplasm related pain (acute) (chronic): Secondary | ICD-10-CM | POA: Diagnosis not present

## 2018-09-15 DIAGNOSIS — Z9181 History of falling: Secondary | ICD-10-CM | POA: Diagnosis not present

## 2018-09-15 DIAGNOSIS — Z0189 Encounter for other specified special examinations: Secondary | ICD-10-CM | POA: Diagnosis not present

## 2018-09-15 DIAGNOSIS — I10 Essential (primary) hypertension: Secondary | ICD-10-CM | POA: Diagnosis not present

## 2018-09-15 DIAGNOSIS — I4892 Unspecified atrial flutter: Secondary | ICD-10-CM | POA: Diagnosis not present

## 2018-09-15 DIAGNOSIS — Z87891 Personal history of nicotine dependence: Secondary | ICD-10-CM | POA: Diagnosis not present

## 2018-09-15 DIAGNOSIS — C7951 Secondary malignant neoplasm of bone: Secondary | ICD-10-CM | POA: Diagnosis not present

## 2018-09-15 LAB — HX GLOMERULAR FILTRATION RATE (ESTIMATED)
CASE NUMBER: 2020190002525
HX AFN AMER GLOMERULAR FILTRATION RATE: 49 mL/min/{1.73_m2}
HX NON-AFN AMER GLOMERULAR FILTRATION RATE: 43 mL/min/{1.73_m2}

## 2018-09-15 LAB — HX CALCIUM LEVEL TOTAL
CASE NUMBER: 2020190002525
HX CALCIUM LVL: 8.6 mg/dL — NL (ref 8.5–10.5)

## 2018-09-15 LAB — HX PHOSPHORUS LEVEL
CASE NUMBER: 2020190002525
HX PHOSPHORUS: 2.7 mg/dL — NL (ref 2.4–4.9)

## 2018-09-15 LAB — HX ALBUMIN LEVEL
CASE NUMBER: 2020190002525
HX ALBUMIN LVL: 3.8 g/dL — NL (ref 3.2–5.0)

## 2018-09-15 LAB — HX MAGNESIUM LEVEL
CASE NUMBER: 2020190002525
HX MAGNESIUM LVL: 2.2 mg/dL — NL (ref 1.7–2.5)

## 2018-09-15 LAB — HX CREATININE LEVEL
CASE NUMBER: 2020190002525
HX CREATININE: 1.48 mg/dL — ABNORMAL HIGH (ref 0.55–1.3)

## 2018-09-18 LAB — HX PROSTATE SPECIFIC ANTIGEN (PSA) FREE AND
CASE NUMBER: 2020190002524
HX PSA FREE: 0.02 ng/mL
HX PSA TOTAL: 0.1 ng/mL

## 2018-09-20 ENCOUNTER — Ambulatory Visit

## 2018-09-21 ENCOUNTER — Ambulatory Visit: Admitting: Urology

## 2018-09-21 NOTE — Progress Notes (Signed)
 * * *    [name] **DOB:** Dec 21, 1931 (85 yo M) **Acc No.** [id number] **DOS:**  09/21/2018    ---       **[name]**    ------    78 Y old Male, DOB: Jul 16, 1931    Account Number: [id number]    [address] [place], [place], [id number]    Home: [phone]    Guarantor: [name], [name] Insurance: AARP Medicare Complete Payer ID:  [id number]    PCP: Perfecto [facility]    Appointment Facility: [facility], [facility].        * * *    09/21/2018 Progress Notes: [name], [name]    ------    ---       **Current Medications**    ---    Taking    * Megace Oral     ---    Discontinued    * Fentanyl 50 MCG/HR Patch 72 Hour 1 patch to skin Transdermal     ---    Unknown    * Omeprazole 20 MG Capsule Delayed Release 1 capsule Orally Once a day    ---    * Acetaminophen 500 MG Capsule 1 capsule as needed Orally every 6 hrs    ---    * Gabapentin 300 MG Capsule 1 capsule Orally Once a day    ---    * Lisinopril 5 MG Tablet 1 tablet Orally Once a day    ---    * Metoprolol Succinate ER 25 MG Tablet Extended Release 24 Hour 1 tablet Orally Once a day    ---    * MiraLax - Powder as directed Orally     ---    * Oxycodone HCl 5 MG/5ML Solution 5 ml as needed Orally every 6 hrs    ---    * Milk of Magnesia 400 MG/5ML Suspension 5 ml as needed Orally Four times a day    ---    * Bisacodyl 10 MG Suppository 1 suppository as needed Rectal Once a day    ---    * Aspirin 81 MG Tablet Delayed Release 1 tablet Orally Once a day    ---    * Calcium     ---    * Casodex 50 MG Tablet 1 tablet Orally Once a day    ---    * Centrum Silver Tablet as directed Orally     ---    * Hydrochlorothiazide 25 MG Tablet 1 tablet Orally Once a day    ---    * Xanax 0.25 MG Tablet 1 tablet Orally Twice a day    ---    * Hydrocodone-Acetaminophen 5-325 MG Tablet 1 tablet as needed Orally every 6 hrs    ---    * Amiodarone HCl 200 MG Tablet 1 tablet Orally Once a day    ---    * Zestril 5 MG Tablet 1 tablet  Orally Once a day    ---    * Zocor 10 MG Tablet 1 tablet every evening Orally Once a day    ---    * Xarelto 15 MG Tablet Orally     ---     Past Medical History    ---      prostate cancer: Dx'd 7/06; S/P XRT: 11/06-1/07: gleason's 8 and then Dx'd  with met prostate cancer in 07/2017: Lupron on/off.        ---    GERD.        ---  Hypercholesterolemia.        ---    Hypertension.        ---    Anxiety.        ---    Atrial Fibrillation: @ 2017.        ---    ED - Impotence of organic origin.        ---    Renal insufficiency.        ---      **Surgical History**    ---      Trans Rectal [place] and Prostate BX 09/2004    ---    cholecystectomy 04/2004    ---    inguinal hernia repair    ---    cystoscopy: c/w rad changes 08/27/2016    ---    cardioversion    ---     **Family History**    ---      No Family History documented.    ---      **Social History**    ---    Alcohol: yes, occasional.    no Smoking status: never smoker, Patient counseled on the dangers of tobacco  use: 09/21/2018.    no Caffeine.     **Allergies**    ---      N.K.D.A.    ---      **Hospitalization/Major Diagnostic Procedure**    ---      fall/compressionfx c/we met disease 5/11-5/15/19    ---      **Review of Systems**    ---     _General_ :    no flank pain. no Fever. no Chills. no Weakness. no Abdominal pain.    _Endocrine_ :    Patient complains of: mild hot flashes: tolerable: .    _Cardiovascular_ :    no Chest pain.    _Pulmonary_ :    no cough. no shortness of breath.    _Gastroenterology_ :    no Constipation. no Diarrhea.    _Musculoskeletal_ :    Back pain yes.    _Urology_ :    no Dysuria. no Blood in urine. no Frequent urination. no Urinary incontinence.          **Reason for Appointment**    ---      1\. 3 mo follow up    ---    2\. Prostate cancer follow up    ---      **History of Present Illness**    ---     _Urological History_ :    pt with complex Hx of prostate canceer as noted and just saw med onc: pt is  voidng  well: good flow, good control, no dysuria, no hematuria.      **Vital Signs**    ---    Ht **5'7"** , Wt **190** , BMI **29.75** , BP **142/90** , Temp **98.2**.      **Examination**    ---     _General:_    general  A&O x 3, no acute distress, pleasant. HEENT  Head: normocephalic,  head: atraumatic, no icterus. Lungs/Respirations:  No labored breathing, good  excursion. Abdomen:  normal, non-distended, no flank pain noted. Muscular  normal gait. Skin:  normal turgor.    GU exam deferred.          **Assessments**    ---    1\. Prostate cancer - C61 (Primary)    ---      **Treatment**    ---      **1\. Prostate  cancer**    Notes:    His PSA is negligible and he is doing well and tolerating the Lupron fairly  well. he will be going back down to [place] soon and hsi daughter knows to stay in  touch with [place] to see if he will be back for his next Lupron or if he needs to  get one down there. he also has Megace if he needs it for the hot flashes.    .    ---     **Therapeutic Injections**    ---      Lupron : 22.5 mg (Dose No:1) (Route: Intramuscular) given by Leola Brazil  , MD on right gluteus    ---       **Labs**    ------    _Lab: ClinitekStatus SG_    ---       Value Reference Range    ---------    GLU Negative   \-    BIL Negative   \-    ------------    KET Negative   \-    ------------    SG 1.020   \-    ------------    BLO Negative   \-    ------------    pH 6.0   \-    ------------    PRO Negative   \-    ------------    URO 0.2 E.U./dL   \-    ------------    NIT Negative   \-    ------------    LEU Negative   \-    ------------       Marcaurelle,[name] 09/21/2018 10:37:22 AM > Abdoulaye Drum E 09/21/2018  10:42:50 AM >    ------      **Procedure Codes**    ---      81003 UA Automated    ---    D6644 LUPRON    ---      **Follow Up**    ---    lupron injection in 3 months    Electronically signed by Leola Brazil , MD on 09/21/2018 at  12:40 PM EDT    Sign off status: Completed        * * [facility]    [address]    [place], [place] [id number]    Tel: [phone]    Fax: [phone]              * * *          Progress Note: [name], [name] 09/21/2018    ---    Note generated by eClinicalWorks EMR/PM Software ([email or web address])

## 2018-10-13 ENCOUNTER — Other Ambulatory Visit: Payer: Self-pay | Admitting: Oncology

## 2018-10-13 DIAGNOSIS — C61 Malignant neoplasm of prostate: Secondary | ICD-10-CM

## 2018-10-14 ENCOUNTER — Telehealth: Payer: Self-pay | Admitting: Oncology

## 2018-10-14 NOTE — Telephone Encounter (Signed)
Left message for patient to call office to set up appointment. Not able to reach patient re schedule message sent 8/6.  Message below sent to desk nurse re 8/6 schedule message.  Seth Bake,  Re the message below. Did she leave a number? I do not see that it was our office that called. He has not been seen in months. What should he be scheduled for? I left a message for him asking that he call office re scheduling appointments.   Thanks, [employer] Entered by Scot Dock on 10/14/2018 at 3:56 PM Priority: Routine <No visit type provided> Department: [facility] Provider:  Scheduling Notes: Received call from patient's friend Ginger stating that she missed a call to schedule appt's. Please call back Ginger [name]. Thank you

## 2018-10-15 ENCOUNTER — Telehealth: Payer: Self-pay | Admitting: Oncology

## 2018-10-15 NOTE — Telephone Encounter (Signed)
Let message for Ginger re August/September appointments. Schedule mailed to [facility] address.   Appointments schedule via staff message forwarded for [facility] desk nurse.

## 2018-10-20 ENCOUNTER — Ambulatory Visit: Payer: PRIVATE HEALTH INSURANCE

## 2018-10-20 ENCOUNTER — Inpatient Hospital Stay: Payer: Medicare Other | Attending: Oncology

## 2018-10-26 ENCOUNTER — Telehealth: Payer: Self-pay | Admitting: Adult Health Nurse Practitioner

## 2018-10-26 NOTE — Telephone Encounter (Signed)
Called patient to schedule a Palliative f/u visit, no answer.  Left message with reason for call along with my name and contact information. °

## 2018-11-03 ENCOUNTER — Telehealth: Payer: Self-pay | Admitting: Adult Health Nurse Practitioner

## 2018-11-03 NOTE — Telephone Encounter (Signed)
Called patient to schedule a Palliative f/u visit, no answer.  Left message with reason for call and requested a call back to let us know if he was still in [place] or if he was at his daughter's house out of state. Left my name and contact number.

## 2018-11-10 ENCOUNTER — Ambulatory Visit: Payer: Medicare Other | Admitting: Internal Medicine

## 2018-11-11 ENCOUNTER — Encounter: Payer: Self-pay | Admitting: Internal Medicine

## 2018-11-11 ENCOUNTER — Ambulatory Visit: Payer: Medicare Other | Admitting: Internal Medicine

## 2018-11-11 NOTE — Progress Notes (Addendum)
                 R  E  S  C  H  E  D  U  L  E  D                                                                                                                                                                                                                                                                                                                                     This very nice 85 y.o. DWM presents forfollow up with HTN, HLD, Pre-Diabetes and Vitamin D Deficiency.       Patient is treated for HTN & BP has been controlled at home. Today's  . Patient has had no complaints of any cardiac type chest pain, palpitations, dyspnea / orthopnea / PND, dizziness, claudication, or dependent edema.      Hyperlipidemia is controlled with diet & meds. Patient denies myalgias or other med SE's. Last Lipids were not at goal: Lab Results  Component Value Date   CHOL 203 (H) 06/01/2017   HDL 62 06/01/2017   LDLCALC 104 (H) 06/01/2017   TRIG 257 (H) 06/01/2017   CHOLHDL 3.3 06/01/2017       Also, the patient has history of PreDiabetes and has had no symptoms of reactive hypoglycemia, diabetic polys, paresthesias or visual blurring.  Last A1c was  Lab Results  Component Value Date   HGBA1C 5.9 (H) 06/01/2017       Further, the patient also has history of Vitamin D Deficiency and supplements vitamin D without any suspected side-effects. Last vitamin D was low (goal 70-100): Lab Results  Component Value Date   VD25OH 39 06/01/2017

## 2018-11-17 ENCOUNTER — Inpatient Hospital Stay: Payer: Medicare Other | Attending: Oncology | Admitting: Oncology

## 2018-11-17 ENCOUNTER — Inpatient Hospital Stay: Payer: Medicare Other

## 2018-11-17 ENCOUNTER — Other Ambulatory Visit: Payer: Self-pay

## 2018-11-17 VITALS — BP 163/90 | HR 71 | Temp 98.0°F | Resp 18 | Ht 67.0 in | Wt 191.1 lb

## 2018-11-17 DIAGNOSIS — C7951 Secondary malignant neoplasm of bone: Secondary | ICD-10-CM | POA: Diagnosis not present

## 2018-11-17 DIAGNOSIS — C61 Malignant neoplasm of prostate: Secondary | ICD-10-CM | POA: Diagnosis not present

## 2018-11-17 DIAGNOSIS — Z79899 Other long term (current) drug therapy: Secondary | ICD-10-CM | POA: Diagnosis not present

## 2018-11-17 DIAGNOSIS — Z7982 Long term (current) use of aspirin: Secondary | ICD-10-CM | POA: Insufficient documentation

## 2018-11-17 DIAGNOSIS — Z79818 Long term (current) use of other agents affecting estrogen receptors and estrogen levels: Secondary | ICD-10-CM | POA: Insufficient documentation

## 2018-11-17 DIAGNOSIS — Z923 Personal history of irradiation: Secondary | ICD-10-CM | POA: Diagnosis not present

## 2018-11-17 DIAGNOSIS — Z191 Hormone sensitive malignancy status: Secondary | ICD-10-CM | POA: Insufficient documentation

## 2018-11-17 DIAGNOSIS — M549 Dorsalgia, unspecified: Secondary | ICD-10-CM | POA: Insufficient documentation

## 2018-11-17 DIAGNOSIS — S32020S Wedge compression fracture of second lumbar vertebra, sequela: Secondary | ICD-10-CM

## 2018-11-17 LAB — CBC WITH DIFFERENTIAL (CANCER CENTER ONLY)
Abs Immature Granulocytes: 0.01 10*3/uL (ref 0.00–0.07)
Basophils Absolute: 0 10*3/uL (ref 0.0–0.1)
Basophils Relative: 1 %
Eosinophils Absolute: 0.2 10*3/uL (ref 0.0–0.5)
Eosinophils Relative: 4 %
HCT: 33.5 % — ABNORMAL LOW (ref 39.0–52.0)
Hemoglobin: 11.2 g/dL — ABNORMAL LOW (ref 13.0–17.0)
Immature Granulocytes: 0 %
Lymphocytes Relative: 31 %
Lymphs Abs: 1.3 10*3/uL (ref 0.7–4.0)
MCH: 33.3 pg (ref 26.0–34.0)
MCHC: 33.4 g/dL (ref 30.0–36.0)
MCV: 99.7 fL (ref 80.0–100.0)
Monocytes Absolute: 0.5 10*3/uL (ref 0.1–1.0)
Monocytes Relative: 12 %
Neutro Abs: 2.3 10*3/uL (ref 1.7–7.7)
Neutrophils Relative %: 52 %
Platelet Count: 153 10*3/uL (ref 150–400)
RBC: 3.36 MIL/uL — ABNORMAL LOW (ref 4.22–5.81)
RDW: 13.5 % (ref 11.5–15.5)
WBC Count: 4.3 10*3/uL (ref 4.0–10.5)
nRBC: 0 % (ref 0.0–0.2)

## 2018-11-17 LAB — CMP (CANCER CENTER ONLY)
ALT: 21 U/L (ref 0–44)
AST: 24 U/L (ref 15–41)
Albumin: 4 g/dL (ref 3.5–5.0)
Alkaline Phosphatase: 52 U/L (ref 38–126)
Anion gap: 10 (ref 5–15)
BUN: 33 mg/dL — ABNORMAL HIGH (ref 8–23)
CO2: 24 mmol/L (ref 22–32)
Calcium: 9.4 mg/dL (ref 8.9–10.3)
Chloride: 106 mmol/L (ref 98–111)
Creatinine: 1.94 mg/dL — ABNORMAL HIGH (ref 0.61–1.24)
GFR, Est AFR Am: 36 mL/min — ABNORMAL LOW (ref >60)
GFR, Estimated: 31 mL/min — ABNORMAL LOW (ref >60)
Glucose, Bld: 133 mg/dL — ABNORMAL HIGH (ref 70–99)
Potassium: 4.4 mmol/L (ref 3.5–5.1)
Sodium: 140 mmol/L (ref 135–145)
Total Bilirubin: 0.5 mg/dL (ref 0.3–1.2)
Total Protein: 6.7 g/dL (ref 6.5–8.1)

## 2018-11-17 MED ORDER — LEUPROLIDE ACETATE (4 MONTH) 30 MG IM KIT: 30.0000 mg | PACK | Freq: Once | INTRAMUSCULAR | Status: DC

## 2018-11-17 MED ORDER — DENOSUMAB 120 MG/1.7ML ~~LOC~~ SOLN
120.0000 mg | Freq: Once | SUBCUTANEOUS | Status: AC
  Administered 2018-11-17: 120 mg via SUBCUTANEOUS

## 2018-11-17 MED ORDER — DENOSUMAB 120 MG/1.7ML ~~LOC~~ SOLN
SUBCUTANEOUS | Status: AC
  Filled 2018-11-17: qty 1.7

## 2018-11-17 NOTE — Progress Notes (Signed)
Hematology and Oncology Follow Up Visit  Logan Brown [id number] Oct 07, 1931 85 y.o. 11/17/2018 9:48 AM [name], MDMcKeown, [name], MD   Principle Diagnosis: 85 year old man with advanced prostate cancer with disease to the bone diagnosed in 2019.  He presented with castration-sensitive at this time.   Prior Therapy: He is status post  radical prostatectomy followed by external beam radiation and remains disease free after diagnosis in 2000.  He presented with a fall and found to have PSA of 245 and multiple osseous lesions indicating metastatic disease to the bone in May 2019.  He was started on Lupron and Xgeva at that time.    Current therapy:   Lupron every 3 months given under the care of Dr. Lovena Neighbours at [facility] urology.   Xgeva 120 mg every month started in January of 2020.  Interim History: Logan Brown returns today for a repeat evaluation.  Since the last visit, he reports no major changes in his health.  He continues to have back pain which is chronic in nature and has been manageable.  He denies any recent falls or syncope.  He denies any recent pathological fractures or hospitalizations.  His performance status and quality of life remains unchanged.  He denied headaches, blurry vision, syncope or seizures.  Denies any fevers, chills or sweats.  Denied chest pain, palpitation, orthopnea or leg edema.  Denied cough, wheezing or hemoptysis.  Denied nausea, vomiting or abdominal pain.  Denies any constipation or diarrhea.  Denies any frequency urgency or hesitancy.  Denies any arthralgias or myalgias.  Denies any skin rashes or lesions.  Denies any bleeding or clotting tendency.  Denies any easy bruising.  Denies any hair or nail changes.  Denies any anxiety or depression.  Remaining review of system is negative.   Patient denied any alteration mental status, neuropathy, confusion or dizziness.  Denies any headaches or lethargy.  Denies any night sweats, weight loss or  changes in appetite.  Denied orthopnea, dyspnea on exertion or chest discomfort.  Denies shortness of breath, difficulty breathing hemoptysis or cough.  Denies any abdominal distention, nausea, early satiety or dyspepsia.  Denies any hematuria, frequency, dysuria or nocturia.  Denies any skin irritation, dryness or rash.  Denies any ecchymosis or petechiae.  Denies any lymphadenopathy or clotting.  Denies any heat or cold intolerance.  Denies any anxiety or depression.  Remaining review of system is negative.        Medications: Updated without changes. Current Outpatient Medications  Medication Sig Dispense Refill  . aspirin EC 81 MG tablet Take 81 mg by mouth daily.    . busPIRone (BUSPAR) 10 MG tablet Take 1 tablet 3 x /day with meals for nerves and anxiety. 270 tablet 1  . Cholecalciferol (VITAMIN D3) 25 MCG (1000 UT) CAPS Vitamin D3 1,000 unit capsule  Take by oral route.    . furosemide (LASIX) 40 MG tablet Take 1 tablet daily as needed for fluid retention & swelling 90 tablet 1  . gabapentin (NEURONTIN) 600 MG tablet Take 1/2 to 1 tablet 2 to 3 x /day for chronic pain 270 tablet 1  . hydrOXYzine (ATARAX/VISTARIL) 25 MG tablet Take 1 tablet (25 mg total) by mouth every 6 (six) hours as needed for itching. 30 tablet 1  . labetalol (NORMODYNE) 100 MG tablet Take 1 whole tablet 2 x /day - morning & night for BP 180 tablet 1  . losartan (COZAAR) 100 MG tablet Take 1 tablet daily for BP 90 tablet 1  .  Multiple Vitamins-Minerals (CENTRUM SILVER ADULT 50+ PO) Take by mouth.    [name] Kitchen omeprazole (PRILOSEC) 20 MG capsule Take 1 capsule Daily for Heartburn & Indigestion 90 capsule 1  . oxyCODONE (OXY IR/ROXICODONE) 5 MG immediate release tablet Take 1 tablet (5 mg total) by mouth every 6 (six) hours as needed for up to 30 days for severe pain. Must last 30 days. 120 tablet 0   No current facility-administered medications for this visit.      Allergies:  Allergies  Allergen Reactions  . Ace  Inhibitors Swelling    angioedema  . Hydrocodone Nausea And Vomiting    Past Medical History, Surgical history, Social history, and Family History reviewed without changes.   Physical Exam: Blood pressure (!) 163/90, pulse 71, temperature 98 F (36.7 C), temperature source Temporal, resp. rate 18, height 5\' 7"  (1.702 m), weight 191 lb 1.6 oz (86.7 kg), SpO2 98 %.    ECOG: 2   General appearance: Alert, awake without any distress. Head: Atraumatic without abnormalities Oropharynx: Without any thrush or ulcers. Eyes: No scleral icterus. Lymph nodes: No lymphadenopathy noted in the cervical, supraclavicular, or axillary nodes Heart:regular rate and rhythm, without any murmurs or gallops.   Lung: Clear to auscultation without any rhonchi, wheezes or dullness to percussion. Abdomin: Soft, nontender without any shifting dullness or ascites. Musculoskeletal: No clubbing or cyanosis. Neurological: No motor or sensory deficits. Skin: No rashes or lesions.       Lab Results: Lab Results  Component Value Date   WBC 4.4 05/12/2018   HGB 12.1 (L) 05/12/2018   HCT 37.0 (L) 05/12/2018   MCV 100.3 (H) 05/12/2018   PLT 145 (L) 05/12/2018     Chemistry      Component Value Date/Time   NA 143 05/12/2018 1440   NA 143 03/23/2018 1109   K 4.7 05/12/2018 1440   CL 111 05/12/2018 1440   CO2 28 05/12/2018 1440   BUN 22 05/12/2018 1440   BUN 24 03/23/2018 1109   CREATININE 1.55 (H) 05/12/2018 1440   CREATININE 1.55 (H) 04/12/2018 0826   CREATININE 1.39 (H) 01/12/2018 1525      Component Value Date/Time   CALCIUM 9.3 05/12/2018 1440   ALKPHOS 55 05/12/2018 1440   AST 26 05/12/2018 1440   AST 19 04/12/2018 0826   ALT 20 05/12/2018 1440   ALT 15 04/12/2018 0826   BILITOT 0.6 05/12/2018 1440   BILITOT 0.6 04/12/2018 0826      Results for CESC, VINCENTE (MRN [id number]) as of 04/13/2018 14:09  Ref. Range 03/12/2018 12:35 04/12/2018 08:26  Prostate Specific Ag, Serum Latest Ref  Range: 0.0 - 4.0 ng/mL <0.1 <0.1      Impression and Plan:   85 year old with the following:   1.    Castration-sensitive advanced prostate cancer with disease to the bone diagnosed in 2019.   He is currently receiving androgen deprivation therapy alone with additional therapy escalation has been deferred at this time.  The natural course of this disease was reviewed as well as risk of progression into castration-resistant disease.  Treatment options at that time would include systemic chemotherapy, Nicki Reaper among others.   2.  Androgen deprivation: He is last Lupron given in 2019 and has not been receiving it regularly.  Risks and benefits of restarting Eligard was discussed and he is agreeable to resume it here.  Complications including weight gain, hot flashes among others were reiterated.  3.  Bone directed therapy: He continues to be on  Xgeva without any complications.  Long-term complications including osteonecrosis of the jaw and hypocalcemia were reiterated.  I continue to urge him to take calcium supplements.   4.  Prognosis and goals of care:therapy remains palliative at this time although aggressive measures are warranted.  5.  Back pain: He continues to receive care at the pain clinic with pain is manageable.  6.  Follow-up:  Will be in the immediate future to start Eligard and in 4 months for repeat evaluation.  25  minutes was spent with the patient face-to-face today.  More than 50% of time was dedicated to updating his disease status, treatment options and answering questions regarding future plan of care.     Zola Button, MD 9/9/20209:48 AM

## 2018-11-18 ENCOUNTER — Telehealth: Payer: Self-pay | Admitting: Oncology

## 2018-11-18 ENCOUNTER — Encounter: Payer: Self-pay | Admitting: Adult Health Nurse Practitioner

## 2018-11-18 ENCOUNTER — Ambulatory Visit: Payer: Medicare Other | Admitting: Internal Medicine

## 2018-11-18 ENCOUNTER — Ambulatory Visit (INDEPENDENT_AMBULATORY_CARE_PROVIDER_SITE_OTHER): Payer: Medicare Other | Admitting: Adult Health Nurse Practitioner

## 2018-11-18 ENCOUNTER — Other Ambulatory Visit: Payer: Self-pay

## 2018-11-18 VITALS — BP 122/80 | HR 63 | Temp 97.8°F | Wt 188.0 lb

## 2018-11-18 DIAGNOSIS — M8588 Other specified disorders of bone density and structure, other site: Secondary | ICD-10-CM | POA: Diagnosis not present

## 2018-11-18 DIAGNOSIS — Z23 Encounter for immunization: Secondary | ICD-10-CM

## 2018-11-18 DIAGNOSIS — M51369 Other intervertebral disc degeneration, lumbar region without mention of lumbar back pain or lower extremity pain: Secondary | ICD-10-CM

## 2018-11-18 DIAGNOSIS — H538 Other visual disturbances: Secondary | ICD-10-CM

## 2018-11-18 DIAGNOSIS — R600 Localized edema: Secondary | ICD-10-CM

## 2018-11-18 DIAGNOSIS — I10 Essential (primary) hypertension: Secondary | ICD-10-CM

## 2018-11-18 DIAGNOSIS — M5136 Other intervertebral disc degeneration, lumbar region: Secondary | ICD-10-CM

## 2018-11-18 DIAGNOSIS — C7951 Secondary malignant neoplasm of bone: Secondary | ICD-10-CM

## 2018-11-18 DIAGNOSIS — G893 Neoplasm related pain (acute) (chronic): Secondary | ICD-10-CM | POA: Diagnosis not present

## 2018-11-18 DIAGNOSIS — I480 Paroxysmal atrial fibrillation: Secondary | ICD-10-CM

## 2018-11-18 LAB — TESTOSTERONE: Testosterone: <3 ng/dL — ABNORMAL LOW (ref 264–916)

## 2018-11-18 LAB — PROSTATE-SPECIFIC AG, SERUM (LABCORP): Prostate Specific Ag, Serum: 0.4 ng/mL (ref 0.0–4.0)

## 2018-11-18 NOTE — Telephone Encounter (Signed)
Called and spoke with patient. Confirmed appts. Had questions that I forwarded to Dr Alen Blew

## 2018-11-18 NOTE — Telephone Encounter (Signed)
Scheduled appt per 9/10 sch message - pt aware of apt date and time

## 2018-11-18 NOTE — Progress Notes (Signed)
Assessment and Plan:  Logan Brown was seen today for other.  Diagnoses and all orders for this visit:  Need for influenza vaccination -     Flu vaccine HIGH DOSE PF, received today  Osteopenia of spine DDD (degenerative disc disease), lumbar Taking calcium and Vitamin D  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 [facility]) Cancer-related pain Follows with Dr Alen Blew and pain managemnt for this Face to face today for RX rolling walker with seat  Blurry vision, left eye Half vision field Overdue for eye exam, patient schedule today Neuro examination intact  Renal insufficiency Increase water intake  Hypertension Taking cozaar 100mg  and labetolol 100mg  BID  Paroxysmol atrial fibrillation (HCC) On bASA High risk of bleeding, not further anticoagulation Followed by Cardiology.   Bilateral lower extremity edema Discussed daily weights Monitor sodium intake Increase water intake 40-60oz daily Elevated lower extremities above heart Rx for Compression stockings, knee high medium compression.  Put on in morning and remove at night.   Patient agrees with plan of care. Further disposition pending results of labs. Discussed med's effects and SE's.   Over 30 minutes of exam, counseling, chart review, and critical decision making was performed.   Future Appointments  Date Time Provider [place]  11/24/2018  2:00 PM [facility] FLUSH [facility] None  01/18/2019  2:00 PM Farida Mcreynolds, Danton Sewer, NP [facility] None  03/22/2019  1:00 PM [facility] LAB 6 [facility] None  03/22/2019  1:30 PM Shadad, Mathis Dad, MD [facility] None  03/22/2019  2:15 PM [facility] FLUSH [facility] None    ------------------------------------------------------------------------------------------------------------------   HPI 85 y.o.male presents for evaluation for face to face related to his increased difficulties with stability and ambulation.  He has history of prosatate cancer with radiation and  surgical treatment.  Recurrance of aggressive meatastic prostate cancer and followed by [facility], Dr Lovena Neighbours, and Oncology, Dr Alen Blew.  He receives months Xgenva and Lupron.  Patient follow with Dr Dossie Arbour for chronic pain management.  He has dual residency between [place] and [place] where he also has established oncologists and continued management.   He reports that he is having increasing trouble walking for and duration without needing to rest.  He is accompanied by his friend Logan Brown who is also concerned about his mobility.  He currently does not use any assisstive devices.  When ambulating through the office he has one arm extended toward the wall for stability.  He denies any dizziness, headaches and any falls at this time. He reports he has healthcare POA and advance directive established in [place].  Discussed importance of establishing this here in [place].  His friend Logan Brown is not on HIPPA form as of today  He has given verbal permission at beginning of appointment to discuss his healthcare with her present.  He reports she has been helping him to coordinate his appointments and healthcare needs.  Discussed modifying his HIPPA form if this is his request as she may not be able to obtain information if not listed at other specialty office as well that provide him care. Previously referral for Palliative care was submitted, discussed this with patient again today.  Contacted their office via text Chat on MyChart to obtain best number for patient to call.  He also reports he is having some bilateral lower extremity edema.  He reports that this mildly resolves over night but then gets worse again after being up for the day.  Reports he is drinking lots of water during the day but when asked  to quantify he is drinking two 12oz bottles a day.  He does not elevated his lower extremities and he does not weight himself daily.  He denies any chest pains, shortness of  breath.  Past Medical History:  Diagnosis Date  . Arthritis   . Atrial fibrillation ([place])   . Bone cancer ([place])   . Cardiomyopathy ([place])   . Hyperlipidemia   . Irregular heartbeat   . Prostate CA ([place])    1997  . Prostate cancer ([place])   . Unstable gait 04/16/2018   unstable gait in home     Allergies  Allergen Reactions  . Ace Inhibitors Swelling    angioedema  . Hydrocodone Nausea And Vomiting    Current Outpatient Medications on File Prior to Visit  Medication Sig  . aspirin EC 81 MG tablet Take 81 mg by mouth daily.  . busPIRone (BUSPAR) 10 MG tablet Take 1 tablet 3 x /day with meals for nerves and anxiety.  . Cholecalciferol (VITAMIN D3) 25 MCG (1000 UT) CAPS Vitamin D3 1,000 unit capsule  Take by oral route.  . furosemide (LASIX) 40 MG tablet Take 1 tablet daily as needed for fluid retention & swelling  . gabapentin (NEURONTIN) 600 MG tablet Take 1/2 to 1 tablet 2 to 3 x /day for chronic pain  . hydrOXYzine (ATARAX/VISTARIL) 25 MG tablet Take 1 tablet (25 mg total) by mouth every 6 (six) hours as needed for itching.  . labetalol (NORMODYNE) 100 MG tablet Take 1 whole tablet 2 x /day - morning & night for BP  . losartan (COZAAR) 100 MG tablet Take 1 tablet daily for BP  . Multiple Vitamins-Minerals (CENTRUM SILVER ADULT 50+ PO) Take by mouth.  [name] Kitchen omeprazole (PRILOSEC) 20 MG capsule Take 1 capsule Daily for Heartburn & Indigestion  . oxyCODONE (OXY IR/ROXICODONE) 5 MG immediate release tablet Take 1 tablet (5 mg total) by mouth every 6 (six) hours as needed for up to 30 days for severe pain. Must last 30 days.  . [DISCONTINUED] lisinopril (PRINIVIL,ZESTRIL) 10 MG tablet Take 1 tablet (10 mg total) by mouth daily.   No current facility-administered medications on file prior to visit.     ROS: all negative except above.   Physical Exam:  BP 122/80   Pulse 63   Temp 97.8 F (36.6 C)   Wt 188 lb (85.3 kg)   SpO2 95%   BMI 29.44 kg/m   General Appearance: Well  nourished, pale, in no apparent distress. Eyes: PERRLA, EOMs, conjunctiva no swelling or erythema Sinuses: No Frontal/maxillary tenderness ENT/Mouth: Ext aud canals clear, TMs without erythema, bulging. No erythema, swelling, or exudate on post pharynx.  Tonsils not swollen or erythematous. Hearing normal.  Neck: Supple, thyroid normal.  Respiratory: Respiratory effort normal, BS equal bilaterally without rales, rhonchi, wheezing or stridor.  Cardio: RRR with no MRGs. Brisk peripheral pulses.  +2 pitting edema.  Abdomen: Soft, + BS.  Non tender, no guarding, rebound, hernias, masses. Lymphatics: Non tender without lymphadenopathy.  Musculoskeletal: Full ROM, 5/5 strength upper, 4/5 strength BLE. mild camptocormia lumbar.  Skin: Warm, dry without rashes, lesions, ecchymosis.  Neuro: Cranial nerves intact. Normal muscle tone, no cerebellar symptoms. Sensation intact.  Psych: Awake and oriented X 3, normal affect, Insight and Judgment appropriate.      [name], NP 12:45 PM [facility]

## 2018-11-18 NOTE — Patient Instructions (Addendum)
   You should drink 60-80 oz a day.  Increase the amount of water you drink every day.   Elevate your legs above your heart to decrease swelling.  We have given you an written order for compression stockings.  You should wear them daily.  Put them on the morning and take them off at night.   We have given you an order for a rolling walker with a eat.  Take this to a medical supply store.   We are not going to get labs today as you just had them done at the [facility].    Please contact Palliative Care, Dr Olena Heckle.  Be sure to discusses Advanced Directives  We will follow up in November.

## 2018-11-23 ENCOUNTER — Telehealth: Payer: Self-pay | Admitting: Oncology

## 2018-11-23 NOTE — Telephone Encounter (Signed)
Returned patient's phone call regarding rescheduling 09/16 appointment, per patient's request appointment has been moved to 09/22.

## 2018-11-24 ENCOUNTER — Inpatient Hospital Stay: Payer: Medicare Other

## 2018-11-30 ENCOUNTER — Other Ambulatory Visit: Payer: Self-pay

## 2018-11-30 ENCOUNTER — Telehealth: Payer: Self-pay | Admitting: Adult Health Nurse Practitioner

## 2018-11-30 ENCOUNTER — Telehealth: Payer: Self-pay | Admitting: Hospice

## 2018-11-30 ENCOUNTER — Inpatient Hospital Stay: Payer: Medicare Other

## 2018-11-30 VITALS — BP 110/77 | HR 54 | Temp 98.5°F | Resp 18

## 2018-11-30 DIAGNOSIS — C7951 Secondary malignant neoplasm of bone: Secondary | ICD-10-CM | POA: Diagnosis not present

## 2018-11-30 DIAGNOSIS — Z79899 Other long term (current) drug therapy: Secondary | ICD-10-CM | POA: Diagnosis not present

## 2018-11-30 DIAGNOSIS — Z7982 Long term (current) use of aspirin: Secondary | ICD-10-CM | POA: Diagnosis not present

## 2018-11-30 DIAGNOSIS — C61 Malignant neoplasm of prostate: Secondary | ICD-10-CM | POA: Diagnosis not present

## 2018-11-30 DIAGNOSIS — Z191 Hormone sensitive malignancy status: Secondary | ICD-10-CM | POA: Diagnosis not present

## 2018-11-30 DIAGNOSIS — S32020S Wedge compression fracture of second lumbar vertebra, sequela: Secondary | ICD-10-CM

## 2018-11-30 DIAGNOSIS — Z923 Personal history of irradiation: Secondary | ICD-10-CM | POA: Diagnosis not present

## 2018-11-30 DIAGNOSIS — M549 Dorsalgia, unspecified: Secondary | ICD-10-CM | POA: Diagnosis not present

## 2018-11-30 MED ORDER — DENOSUMAB 120 MG/1.7ML ~~LOC~~ SOLN: 120.0000 mg | Freq: Once | SUBCUTANEOUS | Status: DC

## 2018-11-30 MED ORDER — LEUPROLIDE ACETATE (4 MONTH) 30 MG ~~LOC~~ KIT
30.0000 mg | PACK | Freq: Once | SUBCUTANEOUS | Status: AC
  Administered 2018-11-30: 30 mg via SUBCUTANEOUS
  Filled 2018-11-30: qty 30

## 2018-11-30 NOTE — Telephone Encounter (Signed)
Rec'd a call from patient's friend, Ginger [name] (lives with patient) wanting to schedule a f/u visit.  I have scheduled an In-person Palliative f/u visit for 12/08/18 @ 11 AM.

## 2018-11-30 NOTE — Patient Instructions (Addendum)

## 2018-11-30 NOTE — Telephone Encounter (Signed)
Ginger called and left voicemail  to request notes needed for Palliative Care nurse, [place]. Please fax to [phone]. Ginger states you had information for Palliative.

## 2018-12-08 ENCOUNTER — Other Ambulatory Visit: Payer: Self-pay

## 2018-12-08 ENCOUNTER — Other Ambulatory Visit: Payer: Self-pay | Admitting: Hospice

## 2018-12-08 DIAGNOSIS — Z515 Encounter for palliative care: Secondary | ICD-10-CM | POA: Diagnosis not present

## 2018-12-08 NOTE — Progress Notes (Signed)
[facility] Consult Note Telephone: [phone]  Fax: [phone]  PATIENT NAME: Logan Brown. DOB: Oct 27, 1931 MRN: [id number]  PRIMARY CARE PROVIDER:   [name], MD  REFERRING PROVIDER:  [name] MD  RESPONSIBLE PARTY:   RESPONSIBLE PARTY:   Extended Emergency Contact Information Primary Emergency Contact: [name],Ginger  [place] Mobile Phone: [phone] Relation: Friend      Due to the COVID-19 crisis, this telephone evaluation and treatment contact was done via telephone and it was initiated and consent by this patient and or family    RECOMMENDATIONS/PLAN:  1. Advance Care Planning/Goals of Care: Telehealth visit consisted of discussions on Palliative Medicine as specialized medical care for people living with serious illness, aimed at facilitating advance care plan, symptoms relief and establishing goals of care. Patient said his goals include to maximize quality of life and symptom management. Appointment set for 03/16/2018 to continue discussion on code status and importance of MOST form.  2. Symptom management: His main concern is his low back pain. He denied pain during visit and said his pain medication is effective; he was going to pick up his refill. Encouraged use as prescribed.  3. Follow up Palliative Care Visit: Palliative care will continue to follow for goals of care clarification and symptom management.   I spent 40 minutes providing this consultation, from 11.10am to 11.50am. More than 50% of the time in this consultation was spent on coordinating advance care advance care communication.  HISTORY OF PRESENT ILLNESS:  Logan Brown is a 85 y.o. year old male with multiple medical problems including arthritis, prostate cancer, bone pain, a fib. Palliative Care was asked to help address goals of care.   CODE STATUS: Full  PPS: 50% HOSPICE ELIGIBILITY/DIAGNOSIS: TBD  PAST  MEDICAL HISTORY:  Past Medical History:  Diagnosis Date  . Arthritis   . Atrial fibrillation ([place])   . Bone cancer ([place])   . Cardiomyopathy ([place])   . Hyperlipidemia   . Irregular heartbeat   . Prostate CA ([place])    1997  . Prostate cancer ([place])   . Unstable gait 04/16/2018   unstable gait in home    SOCIAL HX:  Social History   Tobacco Use  . Smoking status: Former Smoker    Quit date: 03/14/1975    Years since quitting: 43.7  . Smokeless tobacco: Never Used  Substance Use Topics  . Alcohol use: No    Alcohol/week: 0.0 standard drinks    Comment: occ    ALLERGIES:  Allergies  Allergen Reactions  . Ace Inhibitors Swelling    angioedema  . Hydrocodone Nausea And Vomiting     PERTINENT MEDICATIONS:  Outpatient Encounter Medications as of 12/08/2018  Medication Sig  . aspirin EC 81 MG tablet Take 81 mg by mouth daily.  . busPIRone (BUSPAR) 10 MG tablet Take 1 tablet 3 x /day with meals for nerves and anxiety.  . Cholecalciferol (VITAMIN D3) 25 MCG (1000 UT) CAPS Vitamin D3 1,000 unit capsule  Take by oral route.  . furosemide (LASIX) 40 MG tablet Take 1 tablet daily as needed for fluid retention & swelling  . gabapentin (NEURONTIN) 600 MG tablet Take 1/2 to 1 tablet 2 to 3 x /day for chronic pain  . hydrOXYzine (ATARAX/VISTARIL) 25 MG tablet Take 1 tablet (25 mg total) by mouth every 6 (six) hours as needed for itching.  . labetalol (NORMODYNE) 100 MG tablet Take 1 whole tablet 2 x /  day - morning & night for BP  . losartan (COZAAR) 100 MG tablet Take 1 tablet daily for BP  . Multiple Vitamins-Minerals (CENTRUM SILVER ADULT 50+ PO) Take by mouth.  [name] Kitchen omeprazole (PRILOSEC) 20 MG capsule Take 1 capsule Daily for Heartburn & Indigestion  . oxyCODONE (OXY IR/ROXICODONE) 5 MG immediate release tablet Take 1 tablet (5 mg total) by mouth every 6 (six) hours as needed for up to 30 days for severe pain. Must last 30 days.   No facility-administered encounter medications on file as of  12/08/2018.      Teodoro Spray, NP

## 2018-12-15 ENCOUNTER — Other Ambulatory Visit: Payer: Medicare Other | Admitting: Hospice

## 2018-12-15 ENCOUNTER — Other Ambulatory Visit: Payer: Self-pay

## 2018-12-15 DIAGNOSIS — Z515 Encounter for palliative care: Secondary | ICD-10-CM

## 2018-12-15 NOTE — Progress Notes (Signed)
[place] Consult Note Telephone: [phone]  Fax: [phone]  PATIENT NAME: Logan Brown. DOB: 05/21/1931 MRN: [id number]  PRIMARY CARE PROVIDER:   [name], MD  REFERRING PROVIDER:  [name], MD [address] [place],  [place] [address]  RESPONSIBLE PARTY:  Self Extended Emergency Contact Information Primary Emergency Contact: Logan Brown [place] of [place] Mobile Phone: [phone] Relation: Friend  RECOMMENDATIONS/PLAN:  1. Advance Care Planning/Goals of Care: Visit consisted of building trust and further discussions of Palliative Medicine as specialized medical care for people living with serious illness, aimed at facilitating advance care plan, symptoms relief and establishing goals of care. Patient said his goals include to maximize quality of life and symptom management. Patient elected DNR. MOST form also signed today; both to be uploaded to Epic.  2. Symptom management: He said he gets low back pain sometimes. He denied pain during visit and said his pain medication is effective.. Encouraged use as prescribed. Logan Brown said she plans to take him back to pain clinic for supply of his pain medications; she also asked for ways to get access to his VA benefits and get his push chair for free. She was assured [facility] SW will contact her for possible resources.    He gets SOB on exertion, like walking outside and walking the stairs in front of his home' he has appointment with Dr Logan Brown next month and would discuss; not an urgent matter. Education provided on having rest periods during activities, to build up endurance gradually, to call if worsening. Encouraged taking his furosemide as ordered for fluid build up and take his BP meds as ordered. BP during visit was 118/76 which he said is within his normal range when he is compliant with his medications.  3. Follow up Palliative Care  Visit: Palliative care will continue to follow for goals of care clarification and symptom management.  Next month and would discuss.  I spent 70 minutes providing this consultation, from 2.00pm to 3.10pm.  More than 50% of the time in this consultation was spent on coordinating advance care advance care communication.  HISTORY OF PRESENT ILLNESS:Logan Pilatois a 85 y.o.year oldmalewith multiple medical problems including arthritis, prostate cancer, bone pain, a fib. Palliative Care was asked to help address goals of care.   CODE STATUS: DNR  PPS:50% HOSPICE ELIGIBILITY/DIAGNOSIS: TBD  PAST MEDICAL HISTORY:  Past Medical History:  Diagnosis Date  . Arthritis   . Atrial fibrillation ([place])   . Bone cancer ([place])   . Cardiomyopathy ([place])   . Hyperlipidemia   . Irregular heartbeat   . Prostate CA ([place])    1997  . Prostate cancer ([place])   . Unstable gait 04/16/2018   unstable gait in home    SOCIAL HX:  Social History   Tobacco Use  . Smoking status: Former Smoker    Quit date: 03/14/1975    Years since quitting: 43.7  . Smokeless tobacco: Never Used  Substance Use Topics  . Alcohol use: No    Alcohol/week: 0.0 standard drinks    Comment: occ    ALLERGIES:  Allergies  Allergen Reactions  . Ace Inhibitors Swelling    angioedema  . Hydrocodone Nausea And Vomiting     PERTINENT MEDICATIONS:  Outpatient Encounter Medications as of 12/15/2018  Medication Sig  . aspirin EC 81 MG tablet Take 81 mg by mouth daily.  . busPIRone (BUSPAR) 10 MG tablet Take 1 tablet 3 x /day with  meals for nerves and anxiety.  . Cholecalciferol (VITAMIN D3) 25 MCG (1000 UT) CAPS Vitamin D3 1,000 unit capsule  Take by oral route.  . furosemide (LASIX) 40 MG tablet Take 1 tablet daily as needed for fluid retention & swelling  . gabapentin (NEURONTIN) 600 MG tablet Take 1/2 to 1 tablet 2 to 3 x /day for chronic pain  . hydrOXYzine (ATARAX/VISTARIL) 25 MG tablet Take 1 tablet (25 mg total) by  mouth every 6 (six) hours as needed for itching.  . labetalol (NORMODYNE) 100 MG tablet Take 1 whole tablet 2 x /day - morning & night for BP  . losartan (COZAAR) 100 MG tablet Take 1 tablet daily for BP  . Multiple Vitamins-Minerals (CENTRUM SILVER ADULT 50+ PO) Take by mouth.  [name] Kitchen omeprazole (PRILOSEC) 20 MG capsule Take 1 capsule Daily for Heartburn & Indigestion  . oxyCODONE (OXY IR/ROXICODONE) 5 MG immediate release tablet Take 1 tablet (5 mg total) by mouth every 6 (six) hours as needed for up to 30 days for severe pain. Must last 30 days.   No facility-administered encounter medications on file as of 12/15/2018.     ROS/PHYSICAL EXAM:   General: NAD. Cardiovascular: denied chest pain/discomfort Pulmonary: clear lung sounds, normal respiratory effort, no SOB during visit, discussions and signing documents. Abdomen: soft, nontender, + bowel sounds GU: no suprapubic tenderness Extremities: no edema, no joint deformities Skin: no rashes/wounds on exposed skin Neurological: Weakness, alert and oriented x 4  Logan Spray, NP

## 2018-12-16 ENCOUNTER — Other Ambulatory Visit: Payer: Medicare Other | Admitting: Licensed Clinical Social Worker

## 2018-12-16 ENCOUNTER — Other Ambulatory Visit: Payer: Self-pay

## 2018-12-16 DIAGNOSIS — Z515 Encounter for palliative care: Secondary | ICD-10-CM

## 2018-12-17 NOTE — Progress Notes (Signed)
[facility] PALLIATIVE CARE SW NOTE  PATIENT NAME: Logan Brown. DOB: 20-Nov-1931 MRN: [id number]  PRIMARY CARE PROVIDER: [name], MD  RESPONSIBLE PARTY:  Acct ID - Guarantor Home Phone Work Phone Relationship Acct Type  [id number] - [name],Logan Brown [phone]  Self P/F     [address], [address], [place] [address]   Due to the COVID-19 crisis, this virtual check-in visit was done via telephone from my office and it was initiated and consent given by this patientand orfamily.  PLAN OF CARE and INTERVENTIONS:             1. GOALS OF CARE/ ADVANCE CARE PLANNING:  Goal is for patient to remain as independent as possible.  Patient has a DNR and MOST form. 2. SOCIAL/EMOTIONAL/SPIRITUAL ASSESSMENT/ INTERVENTIONS:  SW conducted a virtual check-in visit with patient's friend, Logan Brown.  Palliative Care NP, Logan Brown, requested SW contact patient.  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  SW provided a phone number for the local VA to begin the process of enrolling in services.  Logan Brown reports being a very close friend of patients and willing to assist him in meeting his needs.  He previously worked at [employer].  He had prostate cancer and did not seek additional treatment and feels this has caused the spread of the cancer.  Discussed patient acquiring a transfer w/c and ted hose.  SW provided active listening and supportive counseling. 3. PATIENT/CAREGIVER EDUCATION/ COPING:  SW provided education regarding SW role.  Logan Brown copes by problem-solving. 4. PERSONAL EMERGENCY PLAN:  EMS is contacted. 5. COMMUNITY RESOURCES COORDINATION/ HEALTH CARE NAVIGATION:  None. 6. FINANCIAL/LEGAL CONCERNS/INTERVENTIONS:  Patient is on a fixed income.     SOCIAL HX:  Social History   Tobacco Use  . Smoking status: Former Smoker    Quit date: 03/14/1975    Years since quitting: 43.7  . Smokeless tobacco: Never Used  Substance Use Topics  . Alcohol use: No    Alcohol/week: 0.0 standard  drinks    Comment: occ    CODE STATUS:  DNR  ADVANCED DIRECTIVES: N MOST FORM COMPLETE:  Y HOSPICE EDUCATION PROVIDED: Y PPS:  Logan Brown reports patient's appetite is normal.  He ambulates independently. Duration of visit and documentation:  30 minutes.      Logan Brown Logan Kyer, LCSW

## 2018-12-20 ENCOUNTER — Other Ambulatory Visit: Payer: Self-pay

## 2018-12-20 DIAGNOSIS — C61 Malignant neoplasm of prostate: Secondary | ICD-10-CM

## 2018-12-20 DIAGNOSIS — C7951 Secondary malignant neoplasm of bone: Secondary | ICD-10-CM

## 2018-12-20 DIAGNOSIS — K219 Gastro-esophageal reflux disease without esophagitis: Secondary | ICD-10-CM

## 2018-12-20 MED ORDER — OMEPRAZOLE 20 MG PO CPDR: DELAYED_RELEASE_CAPSULE | ORAL | 1 refills | Status: DC

## 2018-12-20 MED ORDER — GABAPENTIN 600 MG PO TABS: ORAL_TABLET | ORAL | 1 refills | Status: DC

## 2019-01-03 ENCOUNTER — Other Ambulatory Visit: Payer: Self-pay

## 2019-01-03 ENCOUNTER — Telehealth: Payer: Self-pay | Admitting: Cardiology

## 2019-01-03 ENCOUNTER — Encounter: Payer: Self-pay | Admitting: Physician Assistant

## 2019-01-03 ENCOUNTER — Ambulatory Visit: Payer: Medicare Other | Admitting: Physician Assistant

## 2019-01-03 VITALS — BP 118/66 | HR 63 | Ht 67.0 in | Wt 184.0 lb

## 2019-01-03 DIAGNOSIS — I480 Paroxysmal atrial fibrillation: Secondary | ICD-10-CM | POA: Diagnosis not present

## 2019-01-03 DIAGNOSIS — I951 Orthostatic hypotension: Secondary | ICD-10-CM | POA: Diagnosis not present

## 2019-01-03 MED ORDER — LOSARTAN POTASSIUM 50 MG PO TABS: 50.0000 mg | ORAL_TABLET | Freq: Every day | ORAL | 1 refills | Status: DC

## 2019-01-03 MED ORDER — FUROSEMIDE 40 MG PO TABS: ORAL_TABLET | ORAL | 1 refills | Status: DC

## 2019-01-03 NOTE — Progress Notes (Signed)
Cardiology Office Note   Date:  01/03/2019   ID:  Logan Brown., DOB [birth year], MRN [id number]  PCP:  [name], MD Cardiologist:  Minus Breeding, MD 03/27/2017 Electrphysiologist: None  Almyra Deforest, [facility] 05/14/2018 Rosaria Ferries, PA-C     History of Present Illness: Logan Brown. is a 85 y.o. male with a history of PAF previously on amio, w/ ASA 81 mg for anticoag due to hematuria on Xarelto, Prostate CA w/ bone mets, DNR & Hospice seeing, HTN, LE edema, CKD III, hx presumed NICM w/ EF 25%>>55% felt 2nd Aflutter  03/06 office visit, pt in [place], off amio for unclear reasons, hx dizzy spell after spine injection, likely vasovagal  Logan Brown. presents for cardiology follow up  No more dizzy spells, but gets light-headed when he gets up out of bed or out of a chair. Has balance problems, but this is different.  If the symptoms worsened, he feels he would lose consciousness.  However, he has not felt like it was bad enough for him to lose consciousness or fall.  He has not had any falls.  The symptoms last more than a few seconds, he thinks his mood is 15 minutes.  Can walk, but if he tries to go fast, gets SOB. Because of back problems, not able to walk very far.  Can climb steps, had to do this about 3 weeks ago [place].  He went slow because of his back, but tolerated it well from a breathing standpoint.  Is not currently having any lower extremity edema, has had in the past.  The Lasix prescription states as needed for edema, but he has been taking it daily.  In September, he was seen by the NP for his PCP, and his labs were reviewed.  His creatinine was higher than usual at 1.94 (previous baseline approximately 1.5), and he was asked to drink more water.  He states he has been trying to do this.  He thinks he is supposed to get labs rechecked at the [facility] in a week or 2.  Denies orthopnea or PND.  His dyspnea on  exertion has not changed recently.  Never gets chest pain.  No palpitations, does not think he has been back in atrial fibrillation.  He is currently out of his oxycodone and alprazolam, they help his back pain a great deal.  He is working to get those refilled.   Past Medical History:  Diagnosis Date  . Arthritis   . Atrial fibrillation ([place])   . Bone cancer ([place])   . Cardiomyopathy ([place])   . Hyperlipidemia   . Prostate cancer ([place]) 1997  . Unstable gait 04/16/2018   unstable gait in home    Past Surgical History:  Procedure Laterality Date  . CATARACT EXTRACTION    . EYE SURGERY Bilateral    IOL/CE on Lt in 1998 and Rt in 2011.  [name] Kitchen PROSTATE SURGERY    . WRIST FOREIGN BODY REMOVAL     1957 glass    Current Outpatient Medications  Medication Sig Dispense Refill  . ALPRAZolam (XANAX) 1 MG tablet Take 1/2 tab by mouth in the morning and 1 tab in the evening.    [name] Kitchen aspirin EC 81 MG tablet Take 81 mg by mouth daily.    . busPIRone (BUSPAR) 10 MG tablet Take 1 tablet 3 x /day with meals for nerves and anxiety. 270 tablet 1  . Cholecalciferol (  VITAMIN D3) 25 MCG (1000 UT) CAPS Vitamin D3 1,000 unit capsule  Take by oral route.    . furosemide (LASIX) 40 MG tablet Take 1 tablet daily as needed for fluid retention & swelling 90 tablet 1  . gabapentin (NEURONTIN) 600 MG tablet Take 1/2 to 1 tablet 2 to 3 x /day for chronic pain 270 tablet 1  . hydrOXYzine (ATARAX/VISTARIL) 25 MG tablet Take 1 tablet (25 mg total) by mouth every 6 (six) hours as needed for itching. 30 tablet 1  . labetalol (NORMODYNE) 100 MG tablet Take 1 whole tablet 2 x /day - morning & night for BP 180 tablet 1  . losartan (COZAAR) 100 MG tablet Take 1 tablet daily for BP 90 tablet 1  . Multiple Vitamins-Minerals (CENTRUM SILVER ADULT 50+ PO) Take by mouth.    [name] Kitchen omeprazole (PRILOSEC) 20 MG capsule Take 1 capsule Daily for Heartburn & Indigestion 90 capsule 1  . oxyCODONE (OXY IR/ROXICODONE) 5 MG immediate release  tablet Take 1 tablet (5 mg total) by mouth every 6 (six) hours as needed for up to 30 days for severe pain. Must last 30 days. 120 tablet 0   No current facility-administered medications for this visit.     Allergies:   Ace inhibitors and Hydrocodone    Social History:  The patient  reports that he quit smoking about 43 years ago. He has never used smokeless tobacco. He reports that he does not drink alcohol or use drugs.   Family History:  The patient's family history is not on file.  He indicated that his mother is deceased. He indicated that his father is deceased. He indicated that his sister is alive. He indicated that his brother is alive. He indicated that his son is alive.   ROS:  Please see the history of present illness. All other systems are reviewed and negative.    PHYSICAL EXAM: VS:  BP 118/66   Pulse 63   Ht 5\' 7"  (1.702 m)   Wt 184 lb (83.5 kg)   BMI 28.82 kg/m  , BMI Body mass index is 28.82 kg/m. GEN: Well nourished, well developed, male in no acute distress HEENT: normal for age  Neck: JVD approximately 9 cm, no carotid bruit, no masses Cardiac: RRR; no murmur, no rubs, or gallops Respiratory:  clear to auscultation bilaterally, normal work of breathing GI: soft, nontender, nondistended, + BS MS: no deformity or atrophy; no edema; distal pulses are 2+ in all 4 extremities  Skin: warm and dry, no rash Neuro:  Strength and sensation are intact Psych: euthymic mood, full affect   EKG:  EKG is ordered today. The ekg ordered today demonstrates sinus rhythm, heart rate 63, sinus arrhythmia, no acute ischemic changes, no pathologic Q waves  ECHO: 12/07/2015 from [place], [place] LV size is normal, EF 50-55% with no wall motion abnormalities RV size and function is normal Left atrium mildly dilated Trileaflet aortic valve, trace AR Mild MR and TR Mildly dilated aortic root Compared to June 2017, EF was 25% and is now 50%, RV function now normal  CAROTID  DOPPLER: 09/29/2016 20 to 49% stenosis in the right ICA Less than 20% stenosis left ICA  Orthostatic VS Position BP HR  Lying  124/70  64  Sitting  98/66 (patient was lightheaded)  62  Standing  100/62 (still lightheaded)  77  Standing at 3"  116/66  80        Recent Labs: 03/23/2018: Magnesium 2.3 11/17/2018: ALT 21;  BUN 33; Creatinine 1.94; Hemoglobin 11.2; Platelet Count 153; Potassium 4.4; Sodium 140  CBC    Component Value Date/Time   WBC 4.3 11/17/2018 0930   WBC 6.2 06/01/2017 1544   RBC 3.36 (L) 11/17/2018 0930   HGB 11.2 (L) 11/17/2018 0930   HCT 33.5 (L) 11/17/2018 0930   PLT 153 11/17/2018 0930   MCV 99.7 11/17/2018 0930   MCH 33.3 11/17/2018 0930   MCHC 33.4 11/17/2018 0930   RDW 13.5 11/17/2018 0930   LYMPHSABS 1.3 11/17/2018 0930   MONOABS 0.5 11/17/2018 0930   EOSABS 0.2 11/17/2018 0930   BASOSABS 0.0 11/17/2018 0930   CMP Latest Ref Rng & Units 11/17/2018 05/12/2018 04/12/2018  Glucose 70 - 99 mg/dL 133(H) 103(H) 134(H)  BUN 8 - 23 mg/dL 33(H) 22 21  Creatinine 0.61 - 1.24 mg/dL 1.94(H) 1.55(H) 1.55(H)  Sodium 135 - 145 mmol/L 140 143 142  Potassium 3.5 - 5.1 mmol/L 4.4 4.7 4.6  Chloride 98 - 111 mmol/L 106 111 109  CO2 22 - 32 mmol/L 24 28 27   Calcium 8.9 - 10.3 mg/dL 9.4 9.3 8.9  Total Protein 6.5 - 8.1 g/dL 6.7 6.8 6.5  Total Bilirubin 0.3 - 1.2 mg/dL 0.5 0.6 0.6  Alkaline Phos 38 - 126 U/L 52 55 48  AST 15 - 41 U/L 24 26 19   ALT 0 - 44 U/L 21 20 15      Lipid Panel Lab Results  Component Value Date   CHOL 203 (H) 06/01/2017   HDL 62 06/01/2017   LDLCALC 104 (H) 06/01/2017   TRIG 257 (H) 06/01/2017   CHOLHDL 3.3 06/01/2017      Wt Readings from Last 3 Encounters:  01/03/19 184 lb (83.5 kg)  11/18/18 188 lb (85.3 kg)  11/17/18 191 lb 1.6 oz (86.7 kg)     Other studies Reviewed: Additional studies/ records that were reviewed today include: Office notes, hospital records and testing.  ASSESSMENT AND PLAN:  1.  PAF: -Anticoagulation with  baby aspirin only because of a history of hematuria on Xarelto -No known episodes, currently in sinus rhythm -Continue labetalol 100 mg twice daily -Continue to stay off the amiodarone  2.  Orthostatic hypotension -The Lasix was supposed to be as needed, but he has been taking it daily. -His lower extremity edema has resolved, but orthostatic vital signs are positive -Decrease the Lasix to 3 times a week, can also use as needed -Decrease the losartan to 50 mg daily in order to float his blood pressure a little higher.  That way, even if his blood pressure drops, he might not get so dizzy  3.  History of nonischemic cardiomyopathy: -EF previously 25%, improved to 50% in 2017 -At his last office visit, he was supposed to get an echo when he went back to [place] -This is not happened yet, but as long as his volume status is good, okay to hold off  Current medicines are reviewed at length with the patient today.  The patient does not have concerns regarding medicines.  The following changes have been made: Change Lasix to Monday Wednesday and Friday and to decrease losartan to 50 mg daily  Labs/ tests ordered today include:  No orders of the defined types were placed in this encounter. If he is not going to have labs done at the cancer center within the next few weeks, get them here   Disposition:   FU with Minus Breeding, MD in 3 months  Signed, Rosaria Ferries, PA-C  01/03/2019 9:56 AM    [place] Phone: [phone]; Fax: [phone]

## 2019-01-03 NOTE — Telephone Encounter (Signed)
New Message  Pt c/o medication issue:  1. Name of Medication: Losartan 50 mg  2. How are you currently taking this medication (dosage and times per day)? 50 mg  3. Are you having a reaction (difficulty breathing--STAT)? No  4. What is your medication issue? Patient needs some clarification on the medication and how to take the medication. Please call back to discuss.

## 2019-01-03 NOTE — Patient Instructions (Addendum)
Medication Instructions:   Decrease Lasix (Furosemide) to 1 tablet every Monday, Wednesday, and Friday. OK to take an extra tablet if needed for increased swelling or Shortness of Breath.  Decrease Losartan to 50 mg daily.  *If you need a refill on your cardiac medications before your next appointment, please call your pharmacy*  Lab Work: Ask Ginger if she can draw labs in two weeks. If not, can have done here--no appointment needed. Please call our office [phone] to let us know. Our fax number is [phone]) [phone] if Ginger can draw your blood work, please have results faxed to [place].   If you have labs (blood work) drawn today and your tests are completely normal, you will receive your results only by: [name] Kitchen MyChart Message (if you have MyChart) OR . A paper copy in the mail If you have any lab test that is abnormal or we need to change your treatment, we will call you to review the results.  Follow-Up: At [facility], you and your health needs are our priority.  As part of our continuing mission to provide you with exceptional heart care, we have created designated Provider Care Teams.  These Care Teams include your primary Cardiologist (physician) and Advanced Practice Providers (APPs -  Physician Assistants and Nurse Practitioners) who all work together to provide you with the care you need, when you need it.  Your next appointment:   3 months  The format for your next appointment:   Either In Person or Virtual  Provider:   You may see Minus Breeding, MD or one of the following Advanced Practice Providers on your designated Care Team:    Kerin Ransom, PA-C  [place], [place]  Coletta Memos, [place]

## 2019-01-03 NOTE — Telephone Encounter (Signed)
Pt wife was confused about what to do with new medication. Stated the pt was already on 100mg  Losartan and now Dr. Gwenlyn Found prescribed 50mg . Educated pt wife that Dr. Gwenlyn Found wants to decrease pt's dosage to control BP so it does not drop and get dizzy. Pt and pt wife verbalized understanding.

## 2019-01-10 ENCOUNTER — Inpatient Hospital Stay (HOSPITAL_COMMUNITY)
Admission: EM | Admit: 2019-01-10 | Discharge: 2019-01-12 | DRG: 683 | Disposition: A | Discharge status: Home / Self Care | Payer: Medicare Other | Attending: Internal Medicine | Admitting: Internal Medicine

## 2019-01-10 ENCOUNTER — Emergency Department (HOSPITAL_COMMUNITY): Payer: Medicare Other

## 2019-01-10 ENCOUNTER — Other Ambulatory Visit: Payer: Self-pay

## 2019-01-10 ENCOUNTER — Encounter (HOSPITAL_COMMUNITY): Payer: Self-pay | Admitting: *Deleted

## 2019-01-10 DIAGNOSIS — M792 Neuralgia and neuritis, unspecified: Secondary | ICD-10-CM | POA: Diagnosis present

## 2019-01-10 DIAGNOSIS — M8580 Other specified disorders of bone density and structure, unspecified site: Secondary | ICD-10-CM | POA: Diagnosis present

## 2019-01-10 DIAGNOSIS — M160 Bilateral primary osteoarthritis of hip: Secondary | ICD-10-CM | POA: Diagnosis not present

## 2019-01-10 DIAGNOSIS — C61 Malignant neoplasm of prostate: Secondary | ICD-10-CM | POA: Diagnosis present

## 2019-01-10 DIAGNOSIS — I480 Paroxysmal atrial fibrillation: Secondary | ICD-10-CM | POA: Diagnosis not present

## 2019-01-10 DIAGNOSIS — C7951 Secondary malignant neoplasm of bone: Secondary | ICD-10-CM | POA: Diagnosis not present

## 2019-01-10 DIAGNOSIS — F419 Anxiety disorder, unspecified: Secondary | ICD-10-CM | POA: Diagnosis present

## 2019-01-10 DIAGNOSIS — Z9079 Acquired absence of other genital organ(s): Secondary | ICD-10-CM

## 2019-01-10 DIAGNOSIS — R000 Tachycardia, unspecified: Secondary | ICD-10-CM | POA: Diagnosis not present

## 2019-01-10 DIAGNOSIS — K219 Gastro-esophageal reflux disease without esophagitis: Secondary | ICD-10-CM | POA: Diagnosis present

## 2019-01-10 DIAGNOSIS — I951 Orthostatic hypotension: Secondary | ICD-10-CM

## 2019-01-10 DIAGNOSIS — Z66 Do not resuscitate: Secondary | ICD-10-CM | POA: Diagnosis not present

## 2019-01-10 DIAGNOSIS — Z20828 Contact with and (suspected) exposure to other viral communicable diseases: Secondary | ICD-10-CM | POA: Diagnosis present

## 2019-01-10 DIAGNOSIS — R42 Dizziness and giddiness: Secondary | ICD-10-CM | POA: Diagnosis not present

## 2019-01-10 DIAGNOSIS — G893 Neoplasm related pain (acute) (chronic): Secondary | ICD-10-CM | POA: Diagnosis present

## 2019-01-10 DIAGNOSIS — R519 Headache, unspecified: Secondary | ICD-10-CM | POA: Diagnosis not present

## 2019-01-10 DIAGNOSIS — G894 Chronic pain syndrome: Secondary | ICD-10-CM | POA: Diagnosis present

## 2019-01-10 DIAGNOSIS — I130 Hypertensive heart and chronic kidney disease with heart failure and stage 1 through stage 4 chronic kidney disease, or unspecified chronic kidney disease: Secondary | ICD-10-CM | POA: Diagnosis not present

## 2019-01-10 DIAGNOSIS — Z79891 Long term (current) use of opiate analgesic: Secondary | ICD-10-CM

## 2019-01-10 DIAGNOSIS — I5032 Chronic diastolic (congestive) heart failure: Secondary | ICD-10-CM | POA: Diagnosis not present

## 2019-01-10 DIAGNOSIS — N183 Chronic kidney disease, stage 3 unspecified: Secondary | ICD-10-CM | POA: Diagnosis present

## 2019-01-10 DIAGNOSIS — Z87891 Personal history of nicotine dependence: Secondary | ICD-10-CM

## 2019-01-10 DIAGNOSIS — Z7982 Long term (current) use of aspirin: Secondary | ICD-10-CM

## 2019-01-10 DIAGNOSIS — Z961 Presence of intraocular lens: Secondary | ICD-10-CM | POA: Diagnosis present

## 2019-01-10 DIAGNOSIS — Z79899 Other long term (current) drug therapy: Secondary | ICD-10-CM

## 2019-01-10 DIAGNOSIS — I428 Other cardiomyopathies: Secondary | ICD-10-CM | POA: Diagnosis present

## 2019-01-10 DIAGNOSIS — R0902 Hypoxemia: Secondary | ICD-10-CM | POA: Diagnosis not present

## 2019-01-10 DIAGNOSIS — I10 Essential (primary) hypertension: Secondary | ICD-10-CM | POA: Diagnosis not present

## 2019-01-10 DIAGNOSIS — I959 Hypotension, unspecified: Secondary | ICD-10-CM | POA: Diagnosis not present

## 2019-01-10 DIAGNOSIS — M47816 Spondylosis without myelopathy or radiculopathy, lumbar region: Secondary | ICD-10-CM | POA: Diagnosis present

## 2019-01-10 DIAGNOSIS — D7589 Other specified diseases of blood and blood-forming organs: Secondary | ICD-10-CM | POA: Diagnosis not present

## 2019-01-10 DIAGNOSIS — Z9841 Cataract extraction status, right eye: Secondary | ICD-10-CM

## 2019-01-10 DIAGNOSIS — N1831 Chronic kidney disease, stage 3a: Secondary | ICD-10-CM | POA: Diagnosis not present

## 2019-01-10 DIAGNOSIS — R531 Weakness: Secondary | ICD-10-CM | POA: Diagnosis not present

## 2019-01-10 DIAGNOSIS — M5136 Other intervertebral disc degeneration, lumbar region: Secondary | ICD-10-CM | POA: Diagnosis not present

## 2019-01-10 DIAGNOSIS — M48061 Spinal stenosis, lumbar region without neurogenic claudication: Secondary | ICD-10-CM | POA: Diagnosis not present

## 2019-01-10 DIAGNOSIS — N179 Acute kidney failure, unspecified: Secondary | ICD-10-CM | POA: Diagnosis not present

## 2019-01-10 DIAGNOSIS — Z9842 Cataract extraction status, left eye: Secondary | ICD-10-CM

## 2019-01-10 DIAGNOSIS — E860 Dehydration: Secondary | ICD-10-CM | POA: Diagnosis present

## 2019-01-10 DIAGNOSIS — Z8546 Personal history of malignant neoplasm of prostate: Secondary | ICD-10-CM

## 2019-01-10 DIAGNOSIS — F329 Major depressive disorder, single episode, unspecified: Secondary | ICD-10-CM | POA: Diagnosis present

## 2019-01-10 DIAGNOSIS — Z923 Personal history of irradiation: Secondary | ICD-10-CM

## 2019-01-10 DIAGNOSIS — Z743 Need for continuous supervision: Secondary | ICD-10-CM | POA: Diagnosis not present

## 2019-01-10 HISTORY — DX: Chronic kidney disease, unspecified: N18.9

## 2019-01-10 HISTORY — DX: Essential (primary) hypertension: I10

## 2019-01-10 LAB — APTT: aPTT: 29 seconds (ref 24–36)

## 2019-01-10 LAB — CBC
HCT: 37.3 % — ABNORMAL LOW (ref 39.0–52.0)
HCT: 34.3 % — ABNORMAL LOW (ref 39.0–52.0)
Hemoglobin: 12.4 g/dL — ABNORMAL LOW (ref 13.0–17.0)
Hemoglobin: 11.5 g/dL — ABNORMAL LOW (ref 13.0–17.0)
MCH: 35.1 pg — ABNORMAL HIGH (ref 26.0–34.0)
MCH: 34.6 pg — ABNORMAL HIGH (ref 26.0–34.0)
MCHC: 33.2 g/dL (ref 30.0–36.0)
MCHC: 33.5 g/dL (ref 30.0–36.0)
MCV: 105.7 fL — ABNORMAL HIGH (ref 80.0–100.0)
MCV: 103.3 fL — ABNORMAL HIGH (ref 80.0–100.0)
Platelets: 182 10*3/uL (ref 150–400)
Platelets: 152 10*3/uL (ref 150–400)
RBC: 3.53 MIL/uL — ABNORMAL LOW (ref 4.22–5.81)
RBC: 3.32 MIL/uL — ABNORMAL LOW (ref 4.22–5.81)
RDW: 13.6 % (ref 11.5–15.5)
RDW: 13.4 % (ref 11.5–15.5)
WBC: 6.9 10*3/uL (ref 4.0–10.5)
WBC: 7 10*3/uL (ref 4.0–10.5)
nRBC: 0 % (ref 0.0–0.2)
nRBC: 0 % (ref 0.0–0.2)

## 2019-01-10 LAB — COMPREHENSIVE METABOLIC PANEL
ALT: 26 U/L (ref 0–44)
AST: 26 U/L (ref 15–41)
Albumin: 4.1 g/dL (ref 3.5–5.0)
Alkaline Phosphatase: 54 U/L (ref 38–126)
Anion gap: 12 (ref 5–15)
BUN: 44 mg/dL — ABNORMAL HIGH (ref 8–23)
CO2: 25 mmol/L (ref 22–32)
Calcium: 9.1 mg/dL (ref 8.9–10.3)
Chloride: 100 mmol/L (ref 98–111)
Creatinine, Ser: 2.68 mg/dL — ABNORMAL HIGH (ref 0.61–1.24)
GFR calc Af Amer: 24 mL/min — ABNORMAL LOW (ref >60)
GFR calc non Af Amer: 21 mL/min — ABNORMAL LOW (ref >60)
Glucose, Bld: 123 mg/dL — ABNORMAL HIGH (ref 70–99)
Potassium: 4.4 mmol/L (ref 3.5–5.1)
Sodium: 137 mmol/L (ref 135–145)
Total Bilirubin: 0.5 mg/dL (ref 0.3–1.2)
Total Protein: 7 g/dL (ref 6.5–8.1)

## 2019-01-10 LAB — PHOSPHORUS: Phosphorus: 4.6 mg/dL (ref 2.5–4.6)

## 2019-01-10 LAB — SARS CORONAVIRUS 2 (TAT 6-24 HRS): SARS Coronavirus 2: NEGATIVE

## 2019-01-10 LAB — MAGNESIUM: Magnesium: 2.2 mg/dL (ref 1.7–2.4)

## 2019-01-10 LAB — CREATININE, SERUM
Creatinine, Ser: 2.54 mg/dL — ABNORMAL HIGH (ref 0.61–1.24)
GFR calc Af Amer: 26 mL/min — ABNORMAL LOW (ref >60)
GFR calc non Af Amer: 22 mL/min — ABNORMAL LOW (ref >60)

## 2019-01-10 LAB — DIFFERENTIAL
Abs Immature Granulocytes: 0.02 10*3/uL (ref 0.00–0.07)
Basophils Absolute: 0 10*3/uL (ref 0.0–0.1)
Basophils Relative: 0 %
Eosinophils Absolute: 0.2 10*3/uL (ref 0.0–0.5)
Eosinophils Relative: 3 %
Immature Granulocytes: 0 %
Lymphocytes Relative: 17 %
Lymphs Abs: 1.2 10*3/uL (ref 0.7–4.0)
Monocytes Absolute: 0.6 10*3/uL (ref 0.1–1.0)
Monocytes Relative: 8 %
Neutro Abs: 5 10*3/uL (ref 1.7–7.7)
Neutrophils Relative %: 72 %

## 2019-01-10 LAB — VITAMIN B12: Vitamin B-12: 2350 pg/mL — ABNORMAL HIGH (ref 180–914)

## 2019-01-10 LAB — PROTIME-INR
INR: 1 (ref 0.8–1.2)
Prothrombin Time: 13.4 seconds (ref 11.4–15.2)

## 2019-01-10 LAB — ETHANOL: Alcohol, Ethyl (B): <10 mg/dL (ref <10)

## 2019-01-10 LAB — TSH: TSH: 2.096 u[IU]/mL (ref 0.350–4.500)

## 2019-01-10 MED ORDER — ACETAMINOPHEN 650 MG RE SUPP: 650.0000 mg | Freq: Four times a day (QID) | RECTAL | Status: DC | PRN

## 2019-01-10 MED ORDER — ACETAMINOPHEN 325 MG PO TABS: 650.0000 mg | ORAL_TABLET | Freq: Four times a day (QID) | ORAL | Status: DC | PRN

## 2019-01-10 MED ORDER — ALPRAZOLAM 0.5 MG PO TABS
1.0000 mg | ORAL_TABLET | Freq: Every day | ORAL | Status: DC
  Administered 2019-01-10 – 2019-01-11 (×2): 1 mg via ORAL
  Filled 2019-01-10 (×2): qty 2

## 2019-01-10 MED ORDER — CALCIUM CARBONATE 1500 (600 CA) MG PO TABS: 600.0000 mg | ORAL_TABLET | Freq: Two times a day (BID) | ORAL | Status: DC

## 2019-01-10 MED ORDER — ONDANSETRON HCL 4 MG/2ML IJ SOLN: 4.0000 mg | Freq: Four times a day (QID) | INTRAMUSCULAR | Status: DC | PRN

## 2019-01-10 MED ORDER — PANTOPRAZOLE SODIUM 40 MG PO TBEC
40.0000 mg | DELAYED_RELEASE_TABLET | Freq: Every day | ORAL | Status: DC
  Administered 2019-01-10 – 2019-01-12 (×3): 40 mg via ORAL
  Filled 2019-01-10 (×3): qty 1

## 2019-01-10 MED ORDER — CALCIUM CARBONATE 1250 (500 CA) MG PO TABS
1.0000 | ORAL_TABLET | Freq: Two times a day (BID) | ORAL | Status: DC
  Administered 2019-01-11 – 2019-01-12 (×3): 500 mg via ORAL
  Filled 2019-01-10 (×4): qty 1

## 2019-01-10 MED ORDER — SODIUM CHLORIDE 0.9 % IV BOLUS
500.0000 mL | Freq: Once | INTRAVENOUS | Status: AC
  Administered 2019-01-10: 14:00:00 500 mL via INTRAVENOUS

## 2019-01-10 MED ORDER — SODIUM CHLORIDE 0.9 % IV SOLN
INTRAVENOUS | Status: DC
  Administered 2019-01-10 – 2019-01-11 (×3): via INTRAVENOUS

## 2019-01-10 MED ORDER — ONDANSETRON HCL 4 MG PO TABS: 4.0000 mg | ORAL_TABLET | Freq: Four times a day (QID) | ORAL | Status: DC | PRN

## 2019-01-10 MED ORDER — ASPIRIN EC 81 MG PO TBEC
81.0000 mg | DELAYED_RELEASE_TABLET | Freq: Every day | ORAL | Status: DC
  Administered 2019-01-11 – 2019-01-12 (×2): 81 mg via ORAL
  Filled 2019-01-10 (×2): qty 1

## 2019-01-10 MED ORDER — ALPRAZOLAM 0.5 MG PO TABS
0.5000 mg | ORAL_TABLET | Freq: Every day | ORAL | Status: DC
  Administered 2019-01-11 – 2019-01-12 (×2): 0.5 mg via ORAL
  Filled 2019-01-10 (×2): qty 1

## 2019-01-10 MED ORDER — ENOXAPARIN SODIUM 30 MG/0.3ML ~~LOC~~ SOLN
30.0000 mg | SUBCUTANEOUS | Status: DC
  Administered 2019-01-10: 30 mg via SUBCUTANEOUS
  Filled 2019-01-10: qty 0.3

## 2019-01-10 NOTE — ED Notes (Signed)
ED TO INPATIENT HANDOFF REPORT  ED Nurse Name and Phone #:   S Name/Age/Gender Logan Brown. 85 y.o. male Room/Bed: 031C/031C  Code Status   Code Status: Full Code  Home/SNF/Other Home Patient oriented to: self, place, time and situation Is this baseline? Yes   Triage Complete: Triage complete  Chief Complaint Headache, Dizzy  Triage Note Pt from home here with gcems with HA and dizziness that started around 1130. GF states this has been happening more often lately. Pt a lilttle hypotensive with EMS 97/50.    Allergies Allergies  Allergen Reactions  . Ace Inhibitors Swelling    angioedema  . Hydrocodone Nausea And Vomiting    Level of Care/Admitting Diagnosis ED Disposition    ED Disposition Condition [facility] Area: [place] [[id number]]  Level of Care: Telemetry Medical [104]  I expect the patient will be discharged within 24 hours: Yes  LOW acuity---Tx typically complete <24 hrs---ACUTE conditions typically can be evaluated <24 hours---LABS likely to return to acceptable levels <24 hours---IS near functional baseline---EXPECTED to return to current living arrangement---NOT newly hypoxic: Meets criteria for 5C-Observation unit  Covid Evaluation: Asymptomatic Screening Protocol (No Symptoms)  Diagnosis: Orthostatic hypotension [458.0.ICD-9-CM]  Admitting Physician: Mckinley Jewel [id number]  Attending Physician: Mckinley Jewel [phone]  PT Class (Do Not Modify): Observation [104]  PT Acc Code (Do Not Modify): Observation [10022]       B Medical/Surgery History Past Medical History:  Diagnosis Date  . Arthritis   . Atrial fibrillation ([place])   . Bone cancer ([place])   . Cardiomyopathy ([place])   . Hyperlipidemia   . Prostate cancer ([place]) 1997  . Unstable gait 04/16/2018   unstable gait in home   Past Surgical History:  Procedure Laterality Date  . CATARACT EXTRACTION    . EYE SURGERY Bilateral    IOL/CE on Lt  in 1998 and Rt in 2011.  [name] Kitchen PROSTATE SURGERY    . WRIST FOREIGN BODY REMOVAL     1957 glass     A IV Location/Drains/Wounds Patient Lines/Drains/Airways Status   Active Line/Drains/Airways    Name:   Placement date:   Placement time:   Site:   Days:   Peripheral IV 01/10/19 Right Antecubital   01/10/19    1345    Antecubital   less than 1   Epidural Catheter 04/20/18   04/20/18    0848     265   Epidural Catheter 05/13/18   05/13/18    0843     242          Intake/Output Last 24 hours  Intake/Output Summary (Last 24 hours) at 01/10/2019 1627 Last data filed at 01/10/2019 1605 Gross per 24 hour  Intake 500 ml  Output -  Net 500 ml    Labs/Imaging Results for orders placed or performed during the hospital encounter of 01/10/19 (from the past 48 hour(s))  Ethanol     Status: None   Collection Time: 01/10/19  1:48 PM  Result Value Ref Range   Alcohol, Ethyl (B) <10 <10 mg/dL    Comment: (NOTE) Lowest detectable limit for serum alcohol is 10 mg/dL. For medical purposes only. Performed at [facility], [place] [address], [place], [place] [address]   Protime-INR     Status: None   Collection Time: 01/10/19  1:48 PM  Result Value Ref Range   Prothrombin Time 13.4 11.4 - 15.2 seconds   INR 1.0  0.8 - 1.2    Comment: (NOTE) INR goal varies based on device and disease states. Performed at [facility], [place] [address], [place], [place] [address]   APTT     Status: None   Collection Time: 01/10/19  1:48 PM  Result Value Ref Range   aPTT 29 24 - 36 seconds    Comment: Performed at [facility] [address], [place], [place] [address]  CBC     Status: Abnormal   Collection Time: 01/10/19  1:48 PM  Result Value Ref Range   WBC 6.9 4.0 - 10.5 K/uL   RBC 3.53 (L) 4.22 - 5.81 MIL/uL   Hemoglobin 12.4 (L) 13.0 - 17.0 g/dL   HCT 37.3 (L) 39.0 - 52.0 %   MCV 105.7 (H) 80.0 - 100.0 fL   MCH 35.1 (H) 26.0 - 34.0 pg   MCHC 33.2 30.0 - 36.0 g/dL   RDW 13.6 11.5 -  15.5 %   Platelets 182 150 - 400 K/uL   nRBC 0.0 0.0 - 0.2 %    Comment: Performed at [facility], [place] [address], [place], [place] [address]  Differential     Status: None   Collection Time: 01/10/19  1:48 PM  Result Value Ref Range   Neutrophils Relative % 72 %   Neutro Abs 5.0 1.7 - 7.7 K/uL   Lymphocytes Relative 17 %   Lymphs Abs 1.2 0.7 - 4.0 K/uL   Monocytes Relative 8 %   Monocytes Absolute 0.6 0.1 - 1.0 K/uL   Eosinophils Relative 3 %   Eosinophils Absolute 0.2 0.0 - 0.5 K/uL   Basophils Relative 0 %   Basophils Absolute 0.0 0.0 - 0.1 K/uL   Immature Granulocytes 0 %   Abs Immature Granulocytes 0.02 0.00 - 0.07 K/uL    Comment: Performed at [facility] [address], [place], [place] [address]  Comprehensive metabolic panel     Status: Abnormal   Collection Time: 01/10/19  1:48 PM  Result Value Ref Range   Sodium 137 135 - 145 mmol/L   Potassium 4.4 3.5 - 5.1 mmol/L   Chloride 100 98 - 111 mmol/L   CO2 25 22 - 32 mmol/L   Glucose, Bld 123 (H) 70 - 99 mg/dL   BUN 44 (H) 8 - 23 mg/dL   Creatinine, Ser 2.68 (H) 0.61 - 1.24 mg/dL   Calcium 9.1 8.9 - 10.3 mg/dL   Total Protein 7.0 6.5 - 8.1 g/dL   Albumin 4.1 3.5 - 5.0 g/dL   AST 26 15 - 41 U/L   ALT 26 0 - 44 U/L   Alkaline Phosphatase 54 38 - 126 U/L   Total Bilirubin 0.5 0.3 - 1.2 mg/dL   GFR calc non Af Amer 21 (L) >60 mL/min   GFR calc Af Amer 24 (L) >60 mL/min   Anion gap 12 5 - 15    Comment: Performed at [facility] [address] [place], [place] [address]   Ct Head Wo Contrast  Result Date: 01/10/2019 CLINICAL DATA:  Ataxia, dizziness.  Also with headache. EXAM: CT HEAD WITHOUT CONTRAST TECHNIQUE: Contiguous axial images were obtained from the base of the skull through the vertex without intravenous contrast. COMPARISON:  11/03/2016 FINDINGS: Brain: No evidence of acute infarction, hemorrhage, hydrocephalus, extra-axial collection or mass lesion/mass effect. There is ventricular  enlargement reflecting mild atrophy. White matter hypoattenuation is also present consistent with mild chronic microvascular ischemic  change. Vascular: No hyperdense vessel or unexpected calcification. Skull: Normal. Negative for fracture or focal lesion. Sinuses/Orbits: Globes and orbits are unremarkable. Moderate mucosal thickening lines the left sphenoid sinus. Remaining sinuses and the mastoid air cells are clear. Other: None. IMPRESSION: 1. No acute intracranial abnormalities. 2. Mild atrophy and chronic microvascular ischemic change. 3. Moderate left sphenoid sinus mucosal thickening. Electronically Signed   By: Lajean Manes [name]   On: 01/10/2019 13:57   Dg Chest Portable 1 View  Result Date: 01/10/2019 CLINICAL DATA:  Generalized weakness and dizziness. EXAM: PORTABLE CHEST 1 VIEW COMPARISON:  05/10/2017 FINDINGS: Lungs are adequately inflated without consolidation or effusion. Borderline stable cardiomegaly. Remainder of the exam is unchanged. IMPRESSION: No acute cardiopulmonary disease. Borderline stable cardiomegaly. Electronically Signed   By: Marin Olp [name]   On: 01/10/2019 14:29    Pending Labs Unresulted Labs (From admission, onward)    Start     Ordered   01/17/19 0500  Creatinine, serum  (enoxaparin (LOVENOX)    CrCl < 30 ml/min)  Weekly,   R    Comments: while on enoxaparin therapy.    01/10/19 1547   01/11/19 [id number]  Basic metabolic panel  Tomorrow morning,   R     01/10/19 1547   01/11/19 0500  CBC  Tomorrow morning,   R     01/10/19 1547   01/10/19 1548  Vitamin B12  Add-on,   AD     01/10/19 1547   01/10/19 1548  Folate RBC  Add-on,   AD     01/10/19 1547   01/10/19 1547  Magnesium  Once,   STAT     01/10/19 1547   01/10/19 1547  Phosphorus  Once,   STAT     01/10/19 1547   01/10/19 1547  TSH  Once,   STAT     01/10/19 1547   01/10/19 1546  CBC  (enoxaparin (LOVENOX)    CrCl < 30 ml/min)  Once,   STAT    Comments: Baseline for enoxaparin therapy IF NOT ALREADY  DRAWN.  Notify MD if PLT < 100 K.    01/10/19 1547   01/10/19 1546  Creatinine, serum  (enoxaparin (LOVENOX)    CrCl < 30 ml/min)  Once,   STAT    Comments: Baseline for enoxaparin therapy IF NOT ALREADY DRAWN.    01/10/19 1547   01/10/19 1445  SARS CORONAVIRUS 2 (TAT 6-24 HRS) Nasopharyngeal Nasopharyngeal Swab  (Asymptomatic/Tier 2 Patients Labs)  Once,   STAT    Question Answer Comment  Is this test for diagnosis or screening Screening   Symptomatic for COVID-19 as defined by CDC No   Hospitalized for COVID-19 No   Admitted to ICU for COVID-19 No   Previously tested for COVID-19 No   Resident in a congregate (group) care setting No   Employed in healthcare setting No      01/10/19 1445   01/10/19 1320  Urine rapid drug screen (hosp performed)  ONCE - STAT,   STAT     01/10/19 1322   01/10/19 1320  Urinalysis, Routine w reflex microscopic  ONCE - STAT,   STAT     01/10/19 1322          Vitals/Pain Today's Vitals   01/10/19 1515 01/10/19 1530 01/10/19 1545 01/10/19 1600  BP: 126/69 123/88 104/66 134/67  Pulse: 70 (!) 56 68 71  Resp: 18 17 16 19   Temp:      TempSrc:  SpO2: 99% 99% 100% 100%  PainSc:        Isolation Precautions No active isolations  Medications Medications  enoxaparin (LOVENOX) injection 30 mg (has no administration in time range)  0.9 %  sodium chloride infusion ( Intravenous New Bag/Given 01/10/19 1621)  acetaminophen (TYLENOL) tablet 650 mg (has no administration in time range)    Or  acetaminophen (TYLENOL) suppository 650 mg (has no administration in time range)  ondansetron (ZOFRAN) tablet 4 mg (has no administration in time range)    Or  ondansetron (ZOFRAN) injection 4 mg (has no administration in time range)  sodium chloride 0.9 % bolus 500 mL (0 mLs Intravenous Stopped 01/10/19 1605)    Mobility walks with device High fall risk   Focused Assessments Cardiac Assessment Handoff:  Cardiac Rhythm: Sinus bradycardia No results  found for: CKTOTAL, CKMB, CKMBINDEX, TROPONINI No results found for: DDIMER Does the Patient currently have chest pain? No      R Recommendations: See Admitting Provider Note  Report given to:   Additional Notes: .

## 2019-01-10 NOTE — ED Triage Notes (Signed)
Pt from home here with gcems with HA and dizziness that started around 1130. GF states this has been happening more often lately. Pt a lilttle hypotensive with EMS 97/50.

## 2019-01-10 NOTE — ED Provider Notes (Signed)
[facility] EMERGENCY DEPARTMENT Provider Note   CSN: [id number] Arrival date & time: 01/10/19  1233     History   Chief Complaint Chief Complaint  Patient presents with  . Dizziness  . Headache    HPI Logan Heber. is a 85 y.o. male.     Patient with history of atrial fibrillation not currently on anticoagulation, cardiomyopathy (EF 50-55% in 2017, see scan in Epic), chronic diuretic use, unstable gait, chronic pain, lumbar spinal stenosis and compression fracture --presents to the emergency today with complaint of dizziness and lightheadedness.  Patient states that he had difficulty sleeping last night and woke up several times however was not dizzy at that time.  Around 1130 today, patient became more dizzy, which she describes as being lightheaded.  He does not describe any worse than usual imbalance or any sensation of spinning.  He has a mild generalized headache.  No neck pain or fevers. Patient denies signs of stroke including: facial droop, slurred speech, aphasia, weakness/numbness in extremities, imbalance/trouble walking.  He denies any chest pain, cough, shortness of breath.  No nausea, vomiting, or diarrhea today but states that he has had some diarrhea over the past few days prior to today.  No urinary symptoms.  No treatments prior to arrival.  Patient was found to be hypotensive by EMS at 97/50.  Family reports recent decrease of Losartan due to ongoing dizzy spells.      Past Medical History:  Diagnosis Date  . Arthritis   . Atrial fibrillation ([place])   . Bone cancer ([place])   . Cardiomyopathy ([place])   . Hyperlipidemia   . Prostate cancer ([place]) 1997  . Unstable gait 04/16/2018   unstable gait in home    Patient Active Problem List   Diagnosis Date Noted  . Bradycardia by electrocardiogram 05/13/2018  . Hypotension 05/13/2018  . Renal insufficiency 04/05/2018  . Cancer-related pain 04/05/2018  . GERD (gastroesophageal reflux disease)  04/05/2018  . Neurogenic pain 04/05/2018  . 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 ([place]) 04/05/2018  . History of prostate cancer 04/05/2018  . Osteopenia of spine 04/05/2018  . Osteopenia determined by x-ray 04/05/2018  . Osteoarthritis of facet joint of lumbar spine 04/05/2018  . Osteoarthritis involving multiple joints 04/05/2018  . DDD (degenerative disc disease), lumbar 04/05/2018  . Osteoarthritis of hip (Bilateral) 04/05/2018  . Abnormal MRI, lumbar spine 04/05/2018  . Lumbar central spinal stenosis, w/o neurogenic claudication 04/05/2018  . Lumbar foraminal stenosis (Bilateral: L4-5) (Right: L1-2, L5-S1) (Left: L2-3, L3-4) 04/05/2018  . Lumbar facet hypertrophy 04/05/2018  . Lumbar facet joint syndrome (Bilateral) 04/05/2018  . Metastatic cancer to spine ([place]) 04/05/2018  . History of kyphoplasty (L2) 04/05/2018  . Low vitamin B12 level 03/30/2018  . Chronic pain syndrome 03/23/2018  . Long term current use of opiate analgesic 03/23/2018  . Long term prescription benzodiazepine use 03/23/2018  . Pharmacologic therapy 03/23/2018  . Disorder of skeletal system 03/23/2018  . Problems influencing health status 03/23/2018  . Chronic low back pain (Primary Area of Pain) (Bilateral) (R>L) w/o sciatica 03/23/2018  . Compression fracture of L2 lumbar vertebra, sequela 01/13/2018  . Scrotal itching 01/13/2018  . Anxiety 01/13/2018  . Prostate cancer ([place]) 01/12/2018  . Hypocalcemia 01/12/2018  . Overweight (BMI 25.0-29.9) 10/16/2017  . Nonischemic cardiomyopathy ([place]) 03/27/2017  . Atrial fibrillation ([place]) 03/27/2017  . CKD (chronic kidney disease) stage 3, GFR 30-59 ml/min 10/16/2016  . 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  HTN (hypertension) 03/01/2015  . Mixed hyperlipidemia 03/01/2015  . Prediabetes 03/01/2015  . Vitamin D deficiency 03/01/2015  . Medication management 03/01/2015    Past Surgical History:  Procedure Laterality Date  . CATARACT  EXTRACTION    . EYE SURGERY Bilateral    IOL/CE on Lt in 1998 and Rt in 2011.  [name] Kitchen PROSTATE SURGERY    . WRIST FOREIGN BODY REMOVAL     1957 glass        Home Medications    Prior to Admission medications   Medication Sig Start Date End Date Taking? Authorizing Provider  ALPRAZolam Duanne Moron) 1 MG tablet Take 1/2 tab by mouth in the morning and 1 tab in the evening. 01/02/19   [provider]  aspirin EC 81 MG tablet Take 81 mg by mouth daily.    [provider]  busPIRone (BUSPAR) 10 MG tablet Take 1 tablet 3 x /day with meals for nerves and anxiety. 08/23/18   [name], MD  Cholecalciferol (VITAMIN D3) 25 MCG (1000 UT) CAPS Vitamin D3 1,000 unit capsule  Take by oral route.    [provider]  furosemide (LASIX) 40 MG tablet Take 1 tablet by mouth every Monday, Wednesday, and Friday. OK to take an extra tablet if needed for increased swelling or shortness of breath. 01/03/19   Barrett, Evelene Croon, PA-C  gabapentin (NEURONTIN) 600 MG tablet Take 1/2 to 1 tablet 2 to 3 x /day for chronic pain 12/20/18   Liane Comber, NP  hydrOXYzine (ATARAX/VISTARIL) 25 MG tablet Take 1 tablet (25 mg total) by mouth every 6 (six) hours as needed for itching. 01/15/18   [name], NP  labetalol (NORMODYNE) 100 MG tablet Take 1 whole tablet 2 x /day - morning & night for BP 08/23/18   [name], MD  losartan (COZAAR) 50 MG tablet Take 1 tablet (50 mg total) by mouth daily. for BP 01/03/19   Barrett, Evelene Croon, PA-C  Multiple Vitamins-Minerals (CENTRUM SILVER ADULT 50+ PO) Take by mouth.    [provider]  omeprazole (PRILOSEC) 20 MG capsule Take 1 capsule Daily for Heartburn & Indigestion 12/20/18   Liane Comber, NP  oxyCODONE (OXY IR/ROXICODONE) 5 MG immediate release tablet Take 1 tablet (5 mg total) by mouth every 6 (six) hours as needed for up to 30 days for severe pain. Must last 30 days. 05/13/18 06/12/18  [name], MD    Family History No  family history on file.  Social History Social History   Tobacco Use  . Smoking status: Former Smoker    Quit date: 03/14/1975    Years since quitting: 43.8  . Smokeless tobacco: Never Used  Substance Use Topics  . Alcohol use: No    Alcohol/week: 0.0 standard drinks    Comment: occ  . Drug use: No     Allergies   Ace inhibitors and Hydrocodone   Review of Systems Review of Systems  Constitutional: Positive for fatigue and fever.  HENT: Negative for rhinorrhea and sore throat.   Eyes: Negative for redness.  Respiratory: Negative for cough.   Cardiovascular: Negative for chest pain.  Gastrointestinal: Positive for diarrhea. Negative for abdominal pain, nausea and vomiting.  Genitourinary: Negative for dysuria.  Musculoskeletal: Negative for myalgias.  Skin: Negative for rash.  Neurological: Positive for dizziness, light-headedness and headaches. Negative for seizures, syncope, facial asymmetry, speech difficulty, weakness and numbness.     Physical Exam Updated Vital Signs BP 109/61 (BP Location: Right Arm)   Pulse [name] Kitchen)  56   Temp 97.9 F (36.6 C) (Oral)   Resp 11   SpO2 99%   Physical Exam Vitals signs and nursing note reviewed.  Constitutional:      Appearance: He is well-developed.  HENT:     Head: Normocephalic and atraumatic.     Right Ear: Tympanic membrane, ear canal and external ear normal.     Left Ear: Tympanic membrane, ear canal and external ear normal.     Nose: Nose normal.     Mouth/Throat:     Pharynx: Uvula midline.     Comments: Dry mucous membranes.  Eyes:     General: Lids are normal.        Right eye: No discharge.        Left eye: No discharge.     Extraocular Movements: Extraocular movements intact.     Conjunctiva/sclera: Conjunctivae normal.     Pupils: Pupils are equal, round, and reactive to light.     Comments: Non-pale mucous membranes.  Neck:     Musculoskeletal: Normal range of motion and neck supple.  Cardiovascular:      Rate and Rhythm: Normal rate and regular rhythm.     Heart sounds: Normal heart sounds.  Pulmonary:     Effort: Pulmonary effort is normal.     Breath sounds: Normal breath sounds.  Abdominal:     Palpations: Abdomen is soft.     Tenderness: There is no abdominal tenderness.  Musculoskeletal: Normal range of motion.     Cervical back: He exhibits normal range of motion, no tenderness and no bony tenderness.  Skin:    General: Skin is warm and dry.  Neurological:     Mental Status: He is alert and oriented to person, place, and time.     GCS: GCS eye subscore is 4. GCS verbal subscore is 5. GCS motor subscore is 6.     Cranial Nerves: No cranial nerve deficit.     Sensory: No sensory deficit.     Motor: No abnormal muscle tone.     Coordination: Coordination normal.     Gait: Gait normal.     Deep Tendon Reflexes: Reflexes are normal and symmetric.      ED Treatments / Results  Labs (all labs ordered are listed, but only abnormal results are displayed) Labs Reviewed  CBC - Abnormal; Notable for the following components:      Result Value   RBC 3.53 (*)    Hemoglobin 12.4 (*)    HCT 37.3 (*)    MCV 105.7 (*)    MCH 35.1 (*)    All other components within normal limits  COMPREHENSIVE METABOLIC PANEL - Abnormal; Notable for the following components:   Glucose, Bld 123 (*)    BUN 44 (*)    Creatinine, Ser 2.68 (*)    GFR calc non Af Amer 21 (*)    GFR calc Af Amer 24 (*)    All other components within normal limits  SARS CORONAVIRUS 2 (TAT 6-24 HRS)  ETHANOL  PROTIME-INR  APTT  DIFFERENTIAL  RAPID URINE DRUG SCREEN, HOSP PERFORMED  URINALYSIS, ROUTINE W REFLEX MICROSCOPIC    EKG None  Radiology Ct Head Wo Contrast  Result Date: 01/10/2019 CLINICAL DATA:  Ataxia, dizziness.  Also with headache. EXAM: CT HEAD WITHOUT CONTRAST TECHNIQUE: Contiguous axial images were obtained from the base of the skull through the vertex without intravenous contrast. COMPARISON:   11/03/2016 FINDINGS: Brain: No evidence of acute infarction, hemorrhage, hydrocephalus,  extra-axial collection or mass lesion/mass effect. There is ventricular enlargement reflecting mild atrophy. White matter hypoattenuation is also present consistent with mild chronic microvascular ischemic change. Vascular: No hyperdense vessel or unexpected calcification. Skull: Normal. Negative for fracture or focal lesion. Sinuses/Orbits: Globes and orbits are unremarkable. Moderate mucosal thickening lines the left sphenoid sinus. Remaining sinuses and the mastoid air cells are clear. Other: None. IMPRESSION: 1. No acute intracranial abnormalities. 2. Mild atrophy and chronic microvascular ischemic change. 3. Moderate left sphenoid sinus mucosal thickening. Electronically Signed   By: Lajean Manes [name]   On: 01/10/2019 13:57   Dg Chest Portable 1 View  Result Date: 01/10/2019 CLINICAL DATA:  Generalized weakness and dizziness. EXAM: PORTABLE CHEST 1 VIEW COMPARISON:  05/10/2017 FINDINGS: Lungs are adequately inflated without consolidation or effusion. Borderline stable cardiomegaly. Remainder of the exam is unchanged. IMPRESSION: No acute cardiopulmonary disease. Borderline stable cardiomegaly. Electronically Signed   By: Marin Olp [name]   On: 01/10/2019 14:29    Procedures Procedures (including critical care time)  Medications Ordered in ED Medications  enoxaparin (LOVENOX) injection 30 mg (has no administration in time range)  0.9 %  sodium chloride infusion (has no administration in time range)  acetaminophen (TYLENOL) tablet 650 mg (has no administration in time range)    Or  acetaminophen (TYLENOL) suppository 650 mg (has no administration in time range)  ondansetron (ZOFRAN) tablet 4 mg (has no administration in time range)    Or  ondansetron (ZOFRAN) injection 4 mg (has no administration in time range)  sodium chloride 0.9 % bolus 500 mL (0 mLs Intravenous Stopped 01/10/19 1605)     Initial  Impression / Assessment and Plan / ED Course  I have reviewed the triage vital signs and the nursing notes.  Pertinent labs & imaging results that were available during my care of the patient were reviewed by me and considered in my medical decision making (see chart for details).        Patient seen and examined. Work-up initiated. Medications ordered.   Vital signs reviewed and are as follows: BP 105/66   Pulse 62   Temp 97.9 F (36.6 C) (Oral)   Resp (!) 21   SpO2 97%   Orthostatic VS for the past 72 hrs (Last 3 readings):  BP Location  01/10/19 1241 Right Arm   Patient with significant orthostasis with blood pressure 71/57 with standing.  Patient with new AKI today with increase in creatinine to 2.6.  Will give gentle hydration.  Discussed patient with Dr. Vallery Ridge.  Will admit for AKI, dehydration, and orthostasis.  Patient's blood pressure medications may also be contributing.  Discussed with patient and family at bedside.  They are in agreement.  Final Clinical Impressions(s) / ED Diagnoses   Final diagnoses:  Orthostatic hypotension  Acute kidney injury ([place])   Admit.   ED Discharge Orders    None       Carlisle Cater, Hershal Coria 01/10/19 1616    Charlesetta Shanks, MD 01/21/19 [phone]

## 2019-01-10 NOTE — H&P (Signed)
History and Physical    Logan Brown. [id number] DOB: 1931-09-20 DOA: 01/10/2019  PCP: [name], MD  Patient coming from: Home I have personally briefly reviewed patient's old medical records in [place]  Chief Complaint: Dizziness since 2 months  HPI: Logan Brown. is a 85 y.o. male with medical history significant of prostate cancer with metastasis to bone, A. Fib-not on anticoagulation due to hematuria, hypertension, anxiety, nonischemic cardiomyopathy with ejection fraction of 50 to 55%, DDD, chronic pain syndrome, CKD stage III, GERD presents to emergency department due to dizziness.  Patient reports that he has intermittent dizzy spells since 38-month.  Patient reports this morning he became more dizzy and lightheaded however denies association with headache, blurry vision, fall, loss of consciousness, head trauma, slurred speech, facial droop, nausea, vomiting, diarrhea epigastric pain, fever, chills, cough, congestion, recent COVID-19 exposure.  His girlfriend called EMS-patient was found to be hypotensive with blood pressure of 97/50.  Reports that his losartan dose was recently decreased from 100 mg to 50 mg once daily due to his dizzy episodes.  He reports couple of loose stools 2 days ago which resolved now.  No melena, hematemesis.  He lives with his friend, denies smoking, alcohol, illicit drug use.  He has history of prostate cancer with metastasized to the bone.   ED Course: Upon arrival: Patient's blood pressure was low.  Orthostatic vitals positive.  CT head and chest x-ray came back negative for acute findings.  CMP shows AKI on CKD.  CBC shows macrocytosis.  Ethanol level: WNL.  UA and COVID-19: Pending. He received IV fluids in ED.  Review of Systems: As per HPI otherwise negative.    Past Medical History:  Diagnosis Date   Arthritis    Atrial fibrillation ([place])    Bone cancer ([place])    Cardiomyopathy ([place])    Hyperlipidemia     Prostate cancer ([place]) 1997   Unstable gait 04/16/2018   unstable gait in home    Past Surgical History:  Procedure Laterality Date   CATARACT EXTRACTION     EYE SURGERY Bilateral    IOL/CE on Lt in 1998 and Rt in 2011.   PROSTATE SURGERY     WRIST FOREIGN BODY REMOVAL     1957 glass     reports that he quit smoking about 43 years ago. He has never used smokeless tobacco. He reports that he does not drink alcohol or use drugs.  Allergies  Allergen Reactions   Ace Inhibitors Swelling    angioedema   Hydrocodone Nausea And Vomiting    No family history on file.  Prior to Admission medications   Medication Sig Start Date End Date Taking? Authorizing Provider  ALPRAZolam Duanne Moron) 1 MG tablet Take 1/2 tab by mouth in the morning and 1 tab in the evening. 01/02/19   [provider]  aspirin EC 81 MG tablet Take 81 mg by mouth daily.    [provider]  busPIRone (BUSPAR) 10 MG tablet Take 1 tablet 3 x /day with meals for nerves and anxiety. 08/23/18   [name], MD  Cholecalciferol (VITAMIN D3) 25 MCG (1000 UT) CAPS Vitamin D3 1,000 unit capsule  Take by oral route.    [provider]  furosemide (LASIX) 40 MG tablet Take 1 tablet by mouth every Monday, Wednesday, and Friday. OK to take an extra tablet if needed for increased swelling or shortness of breath. 01/03/19   Barrett, Evelene Croon, PA-C  gabapentin (  NEURONTIN) 600 MG tablet Take 1/2 to 1 tablet 2 to 3 x /day for chronic pain 12/20/18   Liane Comber, NP  hydrOXYzine (ATARAX/VISTARIL) 25 MG tablet Take 1 tablet (25 mg total) by mouth every 6 (six) hours as needed for itching. 01/15/18   [name], NP  labetalol (NORMODYNE) 100 MG tablet Take 1 whole tablet 2 x /day - morning & night for BP 08/23/18   [name], MD  losartan (COZAAR) 50 MG tablet Take 1 tablet (50 mg total) by mouth daily. for BP 01/03/19   Barrett, Evelene Croon, PA-C  Multiple Vitamins-Minerals (CENTRUM SILVER  ADULT 50+ PO) Take by mouth.    [provider]  omeprazole (PRILOSEC) 20 MG capsule Take 1 capsule Daily for Heartburn & Indigestion 12/20/18   Liane Comber, NP  oxyCODONE (OXY IR/ROXICODONE) 5 MG immediate release tablet Take 1 tablet (5 mg total) by mouth every 6 (six) hours as needed for up to 30 days for severe pain. Must last 30 days. 05/13/18 06/12/18  [name], MD    Physical Exam: Vitals:   01/10/19 1415 01/10/19 1430 01/10/19 1445 01/10/19 1500  BP: (!) 106/58 (!) 110/56 126/62 118/63  Pulse: (!) 55 66 67 68  Resp: 15 17 20 16   Temp:      TempSrc:      SpO2: 98% 99% 97% 96%    Constitutional: NAD, calm, comfortable Eyes: PERRL, lids and conjunctivae normal ENMT: Mucous membranes are moist. Posterior pharynx clear of any exudate or lesions.Normal dentition.  Neck: normal, supple, no masses, no thyromegaly Respiratory: clear to auscultation bilaterally, no wheezing, no crackles. Normal respiratory effort. No accessory muscle use.  Cardiovascular: Regular rate and rhythm, no murmurs / rubs / gallops. No extremity edema. 2+ pedal pulses. No carotid bruits.  Abdomen: no tenderness, no masses palpated. No hepatosplenomegaly. Bowel sounds positive.  Musculoskeletal: no clubbing / cyanosis. No joint deformity upper and lower extremities. Good ROM, no contractures. Normal muscle tone.  Skin: no rashes, lesions, ulcers. No induration Neurologic: CN 2-12 grossly intact. Sensation intact, DTR normal. Strength 5/5 in all 4.  Psychiatric: Normal judgment and insight. Alert and oriented x 3. Normal mood.    Labs on Admission: I have personally reviewed following labs and imaging studies  CBC: Recent Labs  Lab 01/10/19 1348  WBC 6.9  NEUTROABS 5.0  HGB 12.4*  HCT 37.3*  MCV 105.7*  PLT [id number]   Basic Metabolic Panel: Recent Labs  Lab 01/10/19 1348  NA 137  K 4.4  CL 100  CO2 25  GLUCOSE 123*  BUN 44*  CREATININE 2.68*  CALCIUM 9.1   GFR: Estimated  Creatinine Clearance: 20.8 mL/min (A) (by C-G formula based on SCr of 2.68 mg/dL (H)). Liver Function Tests: Recent Labs  Lab 01/10/19 1348  AST 26  ALT 26  ALKPHOS 54  BILITOT 0.5  PROT 7.0  ALBUMIN 4.1   No results for input(s): LIPASE, AMYLASE in the last 168 hours. No results for input(s): AMMONIA in the last 168 hours. Coagulation Profile: Recent Labs  Lab 01/10/19 1348  INR 1.0   Cardiac Enzymes: No results for input(s): CKTOTAL, CKMB, CKMBINDEX, TROPONINI in the last 168 hours. BNP (last 3 results) No results for input(s): PROBNP in the last 8760 hours. HbA1C: No results for input(s): HGBA1C in the last 72 hours. CBG: No results for input(s): GLUCAP in the last 168 hours. Lipid Profile: No results for input(s): CHOL, HDL, LDLCALC, TRIG, CHOLHDL, LDLDIRECT in the last 72 hours.  Thyroid Function Tests: No results for input(s): TSH, T4TOTAL, FREET4, T3FREE, THYROIDAB in the last 72 hours. Anemia Panel: No results for input(s): VITAMINB12, FOLATE, FERRITIN, TIBC, IRON, RETICCTPCT in the last 72 hours. Urine analysis:    Component Value Date/Time   COLORURINE DARK YELLOW 05/18/2017 1530   APPEARANCEUR CLEAR 05/18/2017 1530   LABSPEC 1.033 05/18/2017 1530   PHURINE 5.5 05/18/2017 1530   GLUCOSEU NEGATIVE 05/18/2017 1530   HGBUR NEGATIVE 05/18/2017 1530   BILIRUBINUR NEGATIVE 10/08/2016 1247   KETONESUR NEGATIVE 05/18/2017 1530   PROTEINUR 1+ (A) 05/18/2017 1530   UROBILINOGEN 1.0 04/02/2009 1921   NITRITE NEGATIVE 11/17/2016 0955   LEUKOCYTESUR NEGATIVE 11/17/2016 0955    Radiological Exams on Admission: Ct Head Wo Contrast  Result Date: 01/10/2019 CLINICAL DATA:  Ataxia, dizziness.  Also with headache. EXAM: CT HEAD WITHOUT CONTRAST TECHNIQUE: Contiguous axial images were obtained from the base of the skull through the vertex without intravenous contrast. COMPARISON:  11/03/2016 FINDINGS: Brain: No evidence of acute infarction, hemorrhage, hydrocephalus,  extra-axial collection or mass lesion/mass effect. There is ventricular enlargement reflecting mild atrophy. White matter hypoattenuation is also present consistent with mild chronic microvascular ischemic change. Vascular: No hyperdense vessel or unexpected calcification. Skull: Normal. Negative for fracture or focal lesion. Sinuses/Orbits: Globes and orbits are unremarkable. Moderate mucosal thickening lines the left sphenoid sinus. Remaining sinuses and the mastoid air cells are clear. Other: None. IMPRESSION: 1. No acute intracranial abnormalities. 2. Mild atrophy and chronic microvascular ischemic change. 3. Moderate left sphenoid sinus mucosal thickening. Electronically Signed   By: Lajean Manes [name]   On: 01/10/2019 13:57   Dg Chest Portable 1 View  Result Date: 01/10/2019 CLINICAL DATA:  Generalized weakness and dizziness. EXAM: PORTABLE CHEST 1 VIEW COMPARISON:  05/10/2017 FINDINGS: Lungs are adequately inflated without consolidation or effusion. Borderline stable cardiomegaly. Remainder of the exam is unchanged. IMPRESSION: No acute cardiopulmonary disease. Borderline stable cardiomegaly. Electronically Signed   By: Marin Olp [name]   On: 01/10/2019 14:29    EKG: Normal sinus rhythm, no ST elevation or depression noted.  Assessment/Plan Principal Problem:   Orthostatic hypotension Active Problems:   HTN (hypertension)   CKD (chronic kidney disease) stage 3, GFR 30-59 ml/min   Prostate cancer (HCC)   GERD (gastroesophageal reflux disease)   History of prostate cancer   DDD (degenerative disc disease), lumbar   Metastatic cancer to spine (HCC)   Chronic diastolic CHF (congestive heart failure) (HCC)    Orthostatic hypotension: -Likely secondary from dehydration + polypharmacy -We will admit patient under observation.  Chest x-ray and CT head came back negative for acute findings.  Ethanol level: WNL, UA and COVID-19: Pending. -Patient orthostatic vitals positive in the ED.   Received IV fluids in ED -We will continue IV fluids.  On telemetry.  Will hold his antihypertensive medication such as beta-blocker, Lasix, losartan, oxycodone and gabapentin at this time. -We will consult physical therapy -Monitor his vitals closely.  AKI on CKD stage III: -Secondary to dehydration. -Continue IV fluids.  Hold ARB.  Monitor kidney function closely.  Paroxysmal A. fib: Rate controlled -Continue aspirin.  Takes labetalol.  EKG: No acute changes. -Will hold labetalol for now. -On telemetry.  He is not on anticoagulation due to history of hematuria. -Evaluated by cardiology outpatient on 01/03/2019.  Nonischemic cardiomyopathy: -Reviewed echo from 2017 which showed ejection fraction of 50%. -His Lasix dose was recently decreased to 1 tablet every Monday, Wednesday and Friday.  His losartan dose was also decreased  from 100 mg to 50 mg once daily  DDD: We will hold his oxycodone due to hypotension -Tylenol as needed.  Continue calcium supplement.  History of prostate cancer metastasized to bone: Status post radical prostatectomy followed by external beam radiation in 2000 -Followed by oncology outpatient.  On Lupron every 3 months and Xgeva every month -Followed by palliative care outpatient. -Takes oxycodone for pain control-we will hold for now due to hypotension.  Anxiety: Continue Xanax.  GERD: Stable continue Protonix   DVT prophylaxis: Teds/SCD/Lovenox  code Status: Full code  family Communication: Patient's friend present at bedside.  Plan of care discussed with patient and his friend in length and they verbalized understanding and agreed with it. Disposition Plan: TBD Consults called: none Admission status: Observation   Mckinley Jewel MD [facility] Pager [phone]  If 7PM-7AM, please contact night-coverage [email or web address] Password [email or web address]  01/10/2019, 3:51 PM

## 2019-01-10 NOTE — Plan of Care (Signed)
Alert and oriented X4. Pt was oriented to the room. Skin warm and dry. Call bell within reach. No distress noted at the moment. Will continue monitoring pt closely.

## 2019-01-10 NOTE — ED Notes (Signed)
ED TO INPATIENT HANDOFF REPORT  ED Nurse Name and Phone #: Annie Main [phone]  S Name/Age/Gender Logan Brown. 85 y.o. male Room/Bed: [phone]  Code Status   Code Status: Full Code  Home/SNF/Other Home Patient oriented to: self, place, time and situation Is this baseline? Yes   Triage Complete: Triage complete  Chief Complaint Headache, Dizzy  Triage Note Pt from home here with gcems with HA and dizziness that started around 1130. GF states this has been happening more often lately. Pt a lilttle hypotensive with EMS 97/50.    Allergies Allergies  Allergen Reactions  . Ace Inhibitors Swelling    angioedema  . Hydrocodone Nausea And Vomiting    Level of Care/Admitting Diagnosis ED Disposition    ED Disposition Condition [facility] Area: [place] [[id number]]  Level of Care: Telemetry Medical [104]  I expect the patient will be discharged within 24 hours: Yes  LOW acuity---Tx typically complete <24 hrs---ACUTE conditions typically can be evaluated <24 hours---LABS likely to return to acceptable levels <24 hours---IS near functional baseline---EXPECTED to return to current living arrangement---NOT newly hypoxic: Meets criteria for 5C-Observation unit  Covid Evaluation: Asymptomatic Screening Protocol (No Symptoms)  Diagnosis: Orthostatic hypotension [458.0.ICD-9-CM]  Admitting Physician: Mckinley Jewel [id number]  Attending Physician: Mckinley Jewel [phone]  PT Class (Do Not Modify): Observation [104]  PT Acc Code (Do Not Modify): Observation [10022]       B Medical/Surgery History Past Medical History:  Diagnosis Date  . Arthritis   . Atrial fibrillation ([place])   . Bone cancer ([place])   . Cardiomyopathy ([place])   . Hyperlipidemia   . Prostate cancer ([place]) 1997  . Unstable gait 04/16/2018   unstable gait in home   Past Surgical History:  Procedure Laterality Date  . CATARACT EXTRACTION    . EYE SURGERY Bilateral     IOL/CE on Lt in 1998 and Rt in 2011.  [name] Kitchen PROSTATE SURGERY    . WRIST FOREIGN BODY REMOVAL     1957 glass     A IV Location/Drains/Wounds Patient Lines/Drains/Airways Status   Active Line/Drains/Airways    Name:   Placement date:   Placement time:   Site:   Days:   Peripheral IV 01/10/19 Right Antecubital   01/10/19    1345    Antecubital   less than 1   Epidural Catheter 04/20/18   04/20/18    0848     265   Epidural Catheter 05/13/18   05/13/18    0843     242          Intake/Output Last 24 hours  Intake/Output Summary (Last 24 hours) at 01/10/2019 1828 Last data filed at 01/10/2019 1605 Gross per 24 hour  Intake 500 ml  Output -  Net 500 ml    Labs/Imaging Results for orders placed or performed during the hospital encounter of 01/10/19 (from the past 48 hour(s))  Ethanol     Status: None   Collection Time: 01/10/19  1:48 PM  Result Value Ref Range   Alcohol, Ethyl (B) <10 <10 mg/dL    Comment: (NOTE) Lowest detectable limit for serum alcohol is 10 mg/dL. For medical purposes only. Performed at [facility], [place] [address] [place], [place] [address]   Protime-INR     Status: None   Collection Time: 01/10/19  1:48 PM  Result Value Ref Range   Prothrombin Time 13.4 11.4 - 15.2 seconds   INR  1.0 0.8 - 1.2    Comment: (NOTE) INR goal varies based on device and disease states. Performed at [facility], [place] [address], [place], [place] [address]   APTT     Status: None   Collection Time: 01/10/19  1:48 PM  Result Value Ref Range   aPTT 29 24 - 36 seconds    Comment: Performed at [facility] [address] [place], [place] [address]  CBC     Status: Abnormal   Collection Time: 01/10/19  1:48 PM  Result Value Ref Range   WBC 6.9 4.0 - 10.5 K/uL   RBC 3.53 (L) 4.22 - 5.81 MIL/uL   Hemoglobin 12.4 (L) 13.0 - 17.0 g/dL   HCT 37.3 (L) 39.0 - 52.0 %   MCV 105.7 (H) 80.0 - 100.0 fL   MCH 35.1 (H) 26.0 - 34.0 pg   MCHC 33.2 30.0 - 36.0 g/dL    RDW 13.6 11.5 - 15.5 %   Platelets 182 150 - 400 K/uL   nRBC 0.0 0.0 - 0.2 %    Comment: Performed at [facility], [place] [address] [place], [place] [address]  Differential     Status: None   Collection Time: 01/10/19  1:48 PM  Result Value Ref Range   Neutrophils Relative % 72 %   Neutro Abs 5.0 1.7 - 7.7 K/uL   Lymphocytes Relative 17 %   Lymphs Abs 1.2 0.7 - 4.0 K/uL   Monocytes Relative 8 %   Monocytes Absolute 0.6 0.1 - 1.0 K/uL   Eosinophils Relative 3 %   Eosinophils Absolute 0.2 0.0 - 0.5 K/uL   Basophils Relative 0 %   Basophils Absolute 0.0 0.0 - 0.1 K/uL   Immature Granulocytes 0 %   Abs Immature Granulocytes 0.02 0.00 - 0.07 K/uL    Comment: Performed at [facility] [address] [place],  [address]  Comprehensive metabolic panel     Status: Abnormal   Collection Time: 01/10/19  1:48 PM  Result Value Ref Range   Sodium 137 135 - 145 mmol/L   Potassium 4.4 3.5 - 5.1 mmol/L   Chloride 100 98 - 111 mmol/L   CO2 25 22 - 32 mmol/L   Glucose, Bld 123 (H) 70 - 99 mg/dL   BUN 44 (H) 8 - 23 mg/dL   Creatinine, Ser 2.68 (H) 0.61 - 1.24 mg/dL   Calcium 9.1 8.9 - 10.3 mg/dL   Total Protein 7.0 6.5 - 8.1 g/dL   Albumin 4.1 3.5 - 5.0 g/dL   AST 26 15 - 41 U/L   ALT 26 0 - 44 U/L   Alkaline Phosphatase 54 38 - 126 U/L   Total Bilirubin 0.5 0.3 - 1.2 mg/dL   GFR calc non Af Amer 21 (L) >60 mL/min   GFR calc Af Amer 24 (L) >60 mL/min   Anion gap 12 5 - 15    Comment: Performed at [facility] [address] [place], [place] [address]  CBC     Status: Abnormal   Collection Time: 01/10/19  3:46 PM  Result Value Ref Range   WBC 7.0 4.0 - 10.5 K/uL   RBC 3.32 (L) 4.22 - 5.81 MIL/uL   Hemoglobin 11.5 (L) 13.0 - 17.0 g/dL   HCT 34.3 (L) 39.0 - 52.0 %   MCV 103.3 (H) 80.0 - 100.0 fL   MCH 34.6 (H) 26.0 - 34.0 pg   MCHC 33.5 30.0 -  36.0 g/dL   RDW 13.4 11.5 - 15.5 %   Platelets 152 150 - 400 K/uL   nRBC 0.0 0.0 - 0.2 %    Comment: Performed at  [facility], [place] [address] [place], [place] [address]  Creatinine, serum     Status: Abnormal   Collection Time: 01/10/19  3:46 PM  Result Value Ref Range   Creatinine, Ser 2.54 (H) 0.61 - 1.24 mg/dL   GFR calc non Af Amer 22 (L) >60 mL/min   GFR calc Af Amer 26 (L) >60 mL/min    Comment: Performed at [facility] [address], [place], [place] [address]  Magnesium     Status: None   Collection Time: 01/10/19  3:46 PM  Result Value Ref Range   Magnesium 2.2 1.7 - 2.4 mg/dL    Comment: Performed at [facility], [place] [address], [place], [place] [address]  Phosphorus     Status: None   Collection Time: 01/10/19  3:46 PM  Result Value Ref Range   Phosphorus 4.6 2.5 - 4.6 mg/dL    Comment: Performed at [facility] [address], [place],  [address]  Vitamin B12     Status: Abnormal   Collection Time: 01/10/19  3:46 PM  Result Value Ref Range   Vitamin B-12 2,350 (H) 180 - 914 pg/mL    Comment: (NOTE) This assay is not validated for testing neonatal or myeloproliferative syndrome specimens for Vitamin B12 levels. Performed at [facility], [place] [address], [place], [place] [address]    Ct Head Wo Contrast  Result Date: 01/10/2019 CLINICAL DATA:  Ataxia, dizziness.  Also with headache. EXAM: CT HEAD WITHOUT CONTRAST TECHNIQUE: Contiguous axial images were obtained from the base of the skull through the vertex without intravenous contrast. COMPARISON:  11/03/2016 FINDINGS: Brain: No evidence of acute infarction, hemorrhage, hydrocephalus, extra-axial collection or mass lesion/mass effect. There is ventricular enlargement reflecting mild atrophy. White matter hypoattenuation is also present consistent with mild chronic microvascular ischemic change. Vascular: No hyperdense vessel or unexpected calcification. Skull: Normal. Negative for fracture or focal lesion. Sinuses/Orbits: Globes and orbits are unremarkable. Moderate mucosal thickening lines the  left sphenoid sinus. Remaining sinuses and the mastoid air cells are clear. Other: None. IMPRESSION: 1. No acute intracranial abnormalities. 2. Mild atrophy and chronic microvascular ischemic change. 3. Moderate left sphenoid sinus mucosal thickening. Electronically Signed   By: Lajean Manes [name]   On: 01/10/2019 13:57   Dg Chest Portable 1 View  Result Date: 01/10/2019 CLINICAL DATA:  Generalized weakness and dizziness. EXAM: PORTABLE CHEST 1 VIEW COMPARISON:  05/10/2017 FINDINGS: Lungs are adequately inflated without consolidation or effusion. Borderline stable cardiomegaly. Remainder of the exam is unchanged. IMPRESSION: No acute cardiopulmonary disease. Borderline stable cardiomegaly. Electronically Signed   By: Marin Olp [name]   On: 01/10/2019 14:29    Pending Labs Unresulted Labs (From admission, onward)    Start     Ordered   01/17/19 0500  Creatinine, serum  (enoxaparin (LOVENOX)    CrCl < 30 ml/min)  Weekly,   R    Comments: while on enoxaparin therapy.    01/10/19 1547   01/11/19 [id number]  Basic metabolic panel  Tomorrow morning,   R     01/10/19 1547   01/11/19 0500  CBC  Tomorrow morning,   R     01/10/19 1547   01/10/19 1548  Folate RBC  Add-on,   AD  01/10/19 1547   01/10/19 1547  TSH  Once,   STAT     01/10/19 1547   01/10/19 1445  SARS CORONAVIRUS 2 (TAT 6-24 HRS) Nasopharyngeal Nasopharyngeal Swab  (Asymptomatic/Tier 2 Patients Labs)  Once,   STAT    Question Answer Comment  Is this test for diagnosis or screening Screening   Symptomatic for COVID-19 as defined by CDC No   Hospitalized for COVID-19 No   Admitted to ICU for COVID-19 No   Previously tested for COVID-19 No   Resident in a congregate (group) care setting No   Employed in healthcare setting No      01/10/19 1445   01/10/19 1320  Urine rapid drug screen (hosp performed)  ONCE - STAT,   STAT     01/10/19 1322   01/10/19 1320  Urinalysis, Routine w reflex microscopic  ONCE - STAT,   STAT     01/10/19  1322          Vitals/Pain Today's Vitals   01/10/19 1630 01/10/19 1645 01/10/19 1700 01/10/19 1730  BP: 118/76 119/78 130/61 137/75  Pulse: 66 61 63 67  Resp: 17 15 14 14   Temp:      TempSrc:      SpO2: 97% 97% 96% 100%  PainSc:        Isolation Precautions No active isolations  Medications Medications  enoxaparin (LOVENOX) injection 30 mg (has no administration in time range)  0.9 %  sodium chloride infusion ( Intravenous New Bag/Given 01/10/19 1621)  acetaminophen (TYLENOL) tablet 650 mg (has no administration in time range)    Or  acetaminophen (TYLENOL) suppository 650 mg (has no administration in time range)  ondansetron (ZOFRAN) tablet 4 mg (has no administration in time range)    Or  ondansetron (ZOFRAN) injection 4 mg (has no administration in time range)  aspirin EC tablet 81 mg (has no administration in time range)  ALPRAZolam (XANAX) tablet 0.5 mg (has no administration in time range)  pantoprazole (PROTONIX) EC tablet 40 mg (has no administration in time range)  ALPRAZolam (XANAX) tablet 1 mg (has no administration in time range)  calcium carbonate (OS-CAL - dosed in mg of elemental calcium) tablet 500 mg of elemental calcium (has no administration in time range)  sodium chloride 0.9 % bolus 500 mL (0 mLs Intravenous Stopped 01/10/19 1605)    Mobility walks with person assist High fall risk   Focused Assessments    R Recommendations: See Admitting Provider Note  Report given to:   Additional Notes:

## 2019-01-11 DIAGNOSIS — E860 Dehydration: Secondary | ICD-10-CM | POA: Diagnosis present

## 2019-01-11 DIAGNOSIS — G894 Chronic pain syndrome: Secondary | ICD-10-CM | POA: Diagnosis present

## 2019-01-11 DIAGNOSIS — N1831 Chronic kidney disease, stage 3a: Secondary | ICD-10-CM | POA: Diagnosis present

## 2019-01-11 DIAGNOSIS — F419 Anxiety disorder, unspecified: Secondary | ICD-10-CM | POA: Diagnosis present

## 2019-01-11 DIAGNOSIS — F329 Major depressive disorder, single episode, unspecified: Secondary | ICD-10-CM | POA: Diagnosis present

## 2019-01-11 DIAGNOSIS — I951 Orthostatic hypotension: Secondary | ICD-10-CM | POA: Diagnosis not present

## 2019-01-11 DIAGNOSIS — M160 Bilateral primary osteoarthritis of hip: Secondary | ICD-10-CM | POA: Diagnosis present

## 2019-01-11 DIAGNOSIS — I130 Hypertensive heart and chronic kidney disease with heart failure and stage 1 through stage 4 chronic kidney disease, or unspecified chronic kidney disease: Secondary | ICD-10-CM | POA: Diagnosis present

## 2019-01-11 DIAGNOSIS — I428 Other cardiomyopathies: Secondary | ICD-10-CM | POA: Diagnosis present

## 2019-01-11 DIAGNOSIS — M47816 Spondylosis without myelopathy or radiculopathy, lumbar region: Secondary | ICD-10-CM | POA: Diagnosis present

## 2019-01-11 DIAGNOSIS — I480 Paroxysmal atrial fibrillation: Secondary | ICD-10-CM | POA: Diagnosis present

## 2019-01-11 DIAGNOSIS — M792 Neuralgia and neuritis, unspecified: Secondary | ICD-10-CM | POA: Diagnosis present

## 2019-01-11 DIAGNOSIS — Z20828 Contact with and (suspected) exposure to other viral communicable diseases: Secondary | ICD-10-CM | POA: Diagnosis present

## 2019-01-11 DIAGNOSIS — C61 Malignant neoplasm of prostate: Secondary | ICD-10-CM | POA: Diagnosis present

## 2019-01-11 DIAGNOSIS — M5136 Other intervertebral disc degeneration, lumbar region: Secondary | ICD-10-CM | POA: Diagnosis present

## 2019-01-11 DIAGNOSIS — C7951 Secondary malignant neoplasm of bone: Secondary | ICD-10-CM | POA: Diagnosis present

## 2019-01-11 DIAGNOSIS — M8580 Other specified disorders of bone density and structure, unspecified site: Secondary | ICD-10-CM | POA: Diagnosis present

## 2019-01-11 DIAGNOSIS — Z66 Do not resuscitate: Secondary | ICD-10-CM | POA: Diagnosis present

## 2019-01-11 DIAGNOSIS — I5032 Chronic diastolic (congestive) heart failure: Secondary | ICD-10-CM | POA: Diagnosis present

## 2019-01-11 DIAGNOSIS — N179 Acute kidney failure, unspecified: Secondary | ICD-10-CM | POA: Diagnosis present

## 2019-01-11 DIAGNOSIS — G893 Neoplasm related pain (acute) (chronic): Secondary | ICD-10-CM | POA: Diagnosis present

## 2019-01-11 DIAGNOSIS — R42 Dizziness and giddiness: Secondary | ICD-10-CM | POA: Diagnosis present

## 2019-01-11 DIAGNOSIS — K219 Gastro-esophageal reflux disease without esophagitis: Secondary | ICD-10-CM | POA: Diagnosis present

## 2019-01-11 DIAGNOSIS — M48061 Spinal stenosis, lumbar region without neurogenic claudication: Secondary | ICD-10-CM | POA: Diagnosis present

## 2019-01-11 DIAGNOSIS — D7589 Other specified diseases of blood and blood-forming organs: Secondary | ICD-10-CM | POA: Diagnosis present

## 2019-01-11 LAB — BASIC METABOLIC PANEL
Anion gap: 8 (ref 5–15)
BUN: 37 mg/dL — ABNORMAL HIGH (ref 8–23)
CO2: 23 mmol/L (ref 22–32)
Calcium: 8.2 mg/dL — ABNORMAL LOW (ref 8.9–10.3)
Chloride: 110 mmol/L (ref 98–111)
Creatinine, Ser: 2.07 mg/dL — ABNORMAL HIGH (ref 0.61–1.24)
GFR calc Af Amer: 33 mL/min — ABNORMAL LOW (ref >60)
GFR calc non Af Amer: 28 mL/min — ABNORMAL LOW (ref >60)
Glucose, Bld: 100 mg/dL — ABNORMAL HIGH (ref 70–99)
Potassium: 4 mmol/L (ref 3.5–5.1)
Sodium: 141 mmol/L (ref 135–145)

## 2019-01-11 LAB — RAPID URINE DRUG SCREEN, HOSP PERFORMED
Amphetamines: NOT DETECTED
Barbiturates: NOT DETECTED
Benzodiazepines: NOT DETECTED
Cocaine: NOT DETECTED
Opiates: NOT DETECTED
Tetrahydrocannabinol: NOT DETECTED

## 2019-01-11 LAB — URINALYSIS, ROUTINE W REFLEX MICROSCOPIC
Bilirubin Urine: NEGATIVE
Glucose, UA: NEGATIVE mg/dL
Hgb urine dipstick: NEGATIVE
Ketones, ur: NEGATIVE mg/dL
Leukocytes,Ua: NEGATIVE
Nitrite: NEGATIVE
Protein, ur: NEGATIVE mg/dL
Specific Gravity, Urine: 1.013 (ref 1.005–1.030)
pH: 6 (ref 5.0–8.0)

## 2019-01-11 LAB — CBC
HCT: 30.8 % — ABNORMAL LOW (ref 39.0–52.0)
Hemoglobin: 10.3 g/dL — ABNORMAL LOW (ref 13.0–17.0)
MCH: 34.6 pg — ABNORMAL HIGH (ref 26.0–34.0)
MCHC: 33.4 g/dL (ref 30.0–36.0)
MCV: 103.4 fL — ABNORMAL HIGH (ref 80.0–100.0)
Platelets: 132 10*3/uL — ABNORMAL LOW (ref 150–400)
RBC: 2.98 MIL/uL — ABNORMAL LOW (ref 4.22–5.81)
RDW: 13.4 % (ref 11.5–15.5)
WBC: 5.4 10*3/uL (ref 4.0–10.5)
nRBC: 0 % (ref 0.0–0.2)

## 2019-01-11 MED ORDER — SODIUM CHLORIDE 0.9 % IV BOLUS
1000.0000 mL | Freq: Once | INTRAVENOUS | Status: AC
  Administered 2019-01-11: 15:00:00 1000 mL via INTRAVENOUS

## 2019-01-11 NOTE — Progress Notes (Signed)
PROGRESS NOTE    Logan Brown.  [id number] DOB: 1932/01/18 DOA: 01/10/2019 PCP: [name], MD   Brief Narrative:  Logan Sobczak. is a 85 y.o. male with medical history significant of prostate cancer with metastasis to bone, A. Fib-not on anticoagulation due to hematuria, hypertension, anxiety, nonischemic cardiomyopathy with ejection fraction of 50 to 55%, DDD, chronic pain syndrome, CKD stage III, GERD presented to emergency department due to dizziness.  Patient reports that he has intermittent dizzy spells since 69-month.  No other complaint, no neurological symptoms. His girlfriend called EMS-patient was found to be hypotensive with blood pressure of 97/50.  Reportedly his losartan dose was decreased from 100 mg to 50 mg recently due to dizzy spells.  No melena or hematemesis.  Upon arrival to ED patient's blood pressure was low.  Orthostatic vitals positive.  CT head and chest x-ray negative for acute findings.  CMP shows AKI on CKD.  CBC shows macrocytosis.  Ethanol level: WNL.  UA and COVID-19 negative. He received IV fluids in ED and was subsequently admitted to hospital service.  Assessment & Plan:   Principal Problem:   Orthostatic hypotension Active Problems:   HTN (hypertension)   CKD (chronic kidney disease) stage 3, GFR 30-59 ml/min   Prostate cancer (HCC)   GERD (gastroesophageal reflux disease)   History of prostate cancer   DDD (degenerative disc disease), lumbar   Metastatic cancer to spine (HCC)   Chronic diastolic CHF (congestive heart failure) (HCC)   Vertigo/orthostatic hypotension: Patient states that he has not had any more dizziness since admission however he has not walked yet either.  His blood pressure is fluctuating between low normal and low despite of holding his losartan, beta-blocker and Lasix.  Extensive chart review reveals that patient has a history of dizziness and vertigo and that patient was placed on Lasix 40 mg 3 times per  week however due to miscommunication, he was taking it daily.  During his recent visit to cardiologist, he was once again told to take it 3 times a day and patient does tell me that he now takes 3 times a day.  To be noted that his Lasix has been prescribed to him due to edema.  He did not have any edema during recent cardiology outpatient visit and he does not have any edema today on my exam either.  After talking to him, he tells me that he drinks approximately 24 ounce of water every day instead of 64.  He was able he looks very dehydrated.  His orthostatic hypotension causing vertigo is likely combination of polypharmacy which includes losartan, Lasix and beta-blocker as well as not drinking enough water a day.  I personally believe that he should not be on any Lasix since he is not drinking enough water anyway.  At this point in time, all of those medications are on hold and I will continue to hold them.  Continue to check orthostatic vitals couple of times a day.  PT OT to evaluate.  AKI on CKD stage IIIA: His baseline creatinine is around 1.6 with GFR being around 47.  His creatinine yesterday was 2.68.  He was started on IV fluids and creatinine improved to 2.07.  I will continue gentle hydration and repeat labs in the morning.  His AKI is likely secondary to dehydration along with being on diuretics.  Paroxysmal atrial fibrillation: Rate controlled.  Continue to hold labetalol.  Not on anticoagulation.  History of nonischemic cardiomyopathy: Recent echo  done in 2017 showed ejection fraction of 50%.  Has no symptoms regarding cardiomyopathy currently.  History of prostate cancer metastasized to bone: Status post radical prostatectomy followed by external beam radiation in 2000 -Followed by oncology outpatient.  On Lupron every 3 months and Xgeva every month -Followed by palliative care outpatient. -Takes oxycodone for pain control-we will hold for now due to hypotension.  Anxiety: Continue  Xanax.  GERD: Continue PPI  DVT prophylaxis: SCD/Lovenox Code Status: DNR (there are multiple notes in the chart which dictate that he has chosen DNR in the past.  He was recently seen by palliative care and signed DNR as well as MOST form but despite of that, he was brought into the emergency department again. Family Communication: None present at bedside.  Plan of care discussed with patient in length and he verbalized understanding and agreed with it. Disposition Plan: Potential discharge tomorrow.  To be evaluated by PT OT today.  Continue IV hydration.  Awaiting improvement in blood pressure and renal function.  Estimated body mass index is 29.12 kg/m as calculated from the following:   Height as of this encounter: 5\' 7"  (1.702 m).   Weight as of this encounter: 84.3 kg.      Nutritional status:               Consultants:   None  Procedures:   None  Antimicrobials:   None   Subjective: Seen and examined.  He was very sleepy.  Did not have any complaint.  Denied dizziness however he has not walked since he has been hospitalized.  Objective: Vitals:   01/11/19 0111 01/11/19 0115 01/11/19 0437 01/11/19 0819  BP:  125/60 (!) 99/51 117/71  Pulse:  (!) 53 61 64  Resp:  20 20 16   Temp:  (!) 97.4 F (36.3 C) 98.3 F (36.8 C) 98.2 F (36.8 C)  TempSrc:  Oral Oral Oral  SpO2:  94% 95% 97%  Weight: 84.3 kg     Height:        Intake/Output Summary (Last 24 hours) at 01/11/2019 0948 Last data filed at 01/11/2019 0800 Gross per 24 hour  Intake 2082.1 ml  Output 1050 ml  Net 1032.1 ml   Filed Weights   01/10/19 2045 01/11/19 0111  Weight: 84.4 kg 84.3 kg    Examination:  General exam: Appears calm and comfortable  Respiratory system: Clear to auscultation. Respiratory effort normal. Cardiovascular system: S1 & S2 heard, RRR. No JVD, murmurs, rubs, gallops or clicks. No pedal edema. Gastrointestinal system: Abdomen is nondistended, soft and nontender.  No organomegaly or masses felt. Normal bowel sounds heard. Central nervous system: Alert and oriented. No focal neurological deficits. Extremities: Symmetric 5 x 5 power. Skin: No rashes, lesions or ulcers Psychiatry: Judgement and insight appear poor. Mood & affect appropriate.    Data Reviewed: I have personally reviewed following labs and imaging studies  CBC: Recent Labs  Lab 01/10/19 1348 01/10/19 1546 01/11/19 0430  WBC 6.9 7.0 5.4  NEUTROABS 5.0  --   --   HGB 12.4* 11.5* 10.3*  HCT 37.3* 34.3* 30.8*  MCV 105.7* 103.3* 103.4*  PLT 182 152 [id number]*   Basic Metabolic Panel: Recent Labs  Lab 01/10/19 1348 01/10/19 1546 01/11/19 0430  NA 137  --  141  K 4.4  --  4.0  CL 100  --  110  CO2 25  --  23  GLUCOSE 123*  --  100*  BUN 44*  --  37*  CREATININE 2.68* 2.54* 2.07*  CALCIUM 9.1  --  8.2*  MG  --  2.2  --   PHOS  --  4.6  --    GFR: Estimated Creatinine Clearance: 27.1 mL/min (A) (by C-G formula based on SCr of 2.07 mg/dL (H)). Liver Function Tests: Recent Labs  Lab 01/10/19 1348  AST 26  ALT 26  ALKPHOS 54  BILITOT 0.5  PROT 7.0  ALBUMIN 4.1   No results for input(s): LIPASE, AMYLASE in the last 168 hours. No results for input(s): AMMONIA in the last 168 hours. Coagulation Profile: Recent Labs  Lab 01/10/19 1348  INR 1.0   Cardiac Enzymes: No results for input(s): CKTOTAL, CKMB, CKMBINDEX, TROPONINI in the last 168 hours. BNP (last 3 results) No results for input(s): PROBNP in the last 8760 hours. HbA1C: No results for input(s): HGBA1C in the last 72 hours. CBG: No results for input(s): GLUCAP in the last 168 hours. Lipid Profile: No results for input(s): CHOL, HDL, LDLCALC, TRIG, CHOLHDL, LDLDIRECT in the last 72 hours. Thyroid Function Tests: Recent Labs    01/10/19 1624  TSH 2.096   Anemia Panel: Recent Labs    01/10/19 1546  VITAMINB12 2,350*   Sepsis Labs: No results for input(s): PROCALCITON, LATICACIDVEN in the last 168  hours.  Recent Results (from the past 240 hour(s))  SARS CORONAVIRUS 2 (TAT 6-24 HRS) Nasopharyngeal Nasopharyngeal Swab     Status: None   Collection Time: 01/10/19  2:45 PM   Specimen: Nasopharyngeal Swab  Result Value Ref Range Status   SARS Coronavirus 2 NEGATIVE NEGATIVE Final    Comment: (NOTE) SARS-CoV-2 target nucleic acids are NOT DETECTED. The SARS-CoV-2 RNA is generally detectable in upper and lower respiratory specimens during the acute phase of infection. Negative results do not preclude SARS-CoV-2 infection, do not rule out co-infections with other pathogens, and should not be used as the sole basis for treatment or other patient management decisions. Negative results must be combined with clinical observations, patient history, and epidemiological information. The expected result is Negative. Fact Sheet for Patients: SugarRoll.be Fact Sheet for Healthcare Providers: https://www.woods-mathews.com/ This test is not yet approved or cleared by the [place] FDA and  has been authorized for detection and/or diagnosis of SARS-CoV-2 by FDA under an Emergency Use Authorization (EUA). This EUA will remain  in effect (meaning this test can be used) for the duration of the COVID-19 declaration under Section 56 4(b)(1) of the Act, 21 U.S.C. section 360bbb-3(b)(1), unless the authorization is terminated or revoked sooner. Performed at [facility], [place] [address] [place], [place] [address]       Radiology Studies: Ct Head Wo Contrast  Result Date: 01/10/2019 CLINICAL DATA:  Ataxia, dizziness.  Also with headache. EXAM: CT HEAD WITHOUT CONTRAST TECHNIQUE: Contiguous axial images were obtained from the base of the skull through the vertex without intravenous contrast. COMPARISON:  11/03/2016 FINDINGS: Brain: No evidence of acute infarction, hemorrhage, hydrocephalus, extra-axial collection or mass lesion/mass effect. There is  ventricular enlargement reflecting mild atrophy. White matter hypoattenuation is also present consistent with mild chronic microvascular ischemic change. Vascular: No hyperdense vessel or unexpected calcification. Skull: Normal. Negative for fracture or focal lesion. Sinuses/Orbits: Globes and orbits are unremarkable. Moderate mucosal thickening lines the left sphenoid sinus. Remaining sinuses and the mastoid air cells are clear. Other: None. IMPRESSION: 1. No acute intracranial abnormalities. 2. Mild atrophy and chronic microvascular ischemic change. 3. Moderate left sphenoid sinus mucosal thickening. Electronically Signed   By:  Lajean Manes [name]   On: 01/10/2019 13:57   Dg Chest Portable 1 View  Result Date: 01/10/2019 CLINICAL DATA:  Generalized weakness and dizziness. EXAM: PORTABLE CHEST 1 VIEW COMPARISON:  05/10/2017 FINDINGS: Lungs are adequately inflated without consolidation or effusion. Borderline stable cardiomegaly. Remainder of the exam is unchanged. IMPRESSION: No acute cardiopulmonary disease. Borderline stable cardiomegaly. Electronically Signed   By: Marin Olp [name]   On: 01/10/2019 14:29    Scheduled Meds:  ALPRAZolam  0.5 mg Oral Q0600   ALPRAZolam  1 mg Oral QHS   aspirin EC  81 mg Oral Daily   calcium carbonate  1 tablet Oral BID WC   pantoprazole  40 mg Oral Daily   Continuous Infusions:  sodium chloride 100 mL/hr at 01/11/19 0249     LOS: 0 days   Time spent: 35 minutes   Darliss Cheney, MD [facility]  01/11/2019, 9:48 AM   To contact the attending provider between 7A-7P or the covering provider during after hours 7P-7A, please log into the web site [email or web address] and use password [email or web address].

## 2019-01-11 NOTE — Evaluation (Signed)
Physical Therapy Evaluation Patient Details Name: Logan Brown. MRN: [id number] DOB: 02/17/1932 Today's Date: 01/11/2019   History of Present Illness  Pt is an 85 y.o. male admitted 01/10/19 with 59-month history of dizziness spells. (+) orthostatic hypotension in ED. PMH includes prostate CA with mets to bone, afib (not on anticoag), HTN, anxiety, nonischemic cardiomyopathy, DDD, chronic pain syndrome, CKD III.    Clinical Impression  Pt presents with an overall decrease in functional mobility secondary to above. PTA, pt independent and lives with girlfriend; reports a couple months of dizziness, but this does not occur daily. Today, pt ambulating well with RW at supervision-level. Orthostatics negative (see values below), pt asymptomatic. Pt would benefit from continued acute PT services to maximize functional mobility and independence prior to d/c home.  Orthostatic BPs Supine 99/52  Sitting 104/60  Standing 101/74  Standing after 2 min 112/76  Post-ambulation 121/108       Follow Up Recommendations No PT follow up;Supervision for mobility/OOB    Equipment Recommendations  Other (comment)(rollator, shower seat)    Recommendations for Other Services       Precautions / Restrictions Precautions Precautions: Fall Restrictions Weight Bearing Restrictions: No      Mobility  Bed Mobility Overal bed mobility: Independent                Transfers Overall transfer level: Modified independent Equipment used: Rolling walker (2 wheeled)                Ambulation/Gait Ambulation/Gait assistance: Supervision Gait Distance (Feet): 300 Feet Assistive device: Rolling walker (2 wheeled) Gait Pattern/deviations: Step-through pattern;Decreased stride length;Trunk flexed Gait velocity: Decreased Gait velocity interpretation: 1.31 - 2.62 ft/sec, indicative of limited community ambulator General Gait Details: Slow, steady gait with RW; cues for trunk extension, pt  reports flexing over walker helps decrease lower back discomfort. Denies dizziness  Stairs            Wheelchair Mobility    Modified Rankin (Stroke Patients Only)       Balance Overall balance assessment: Needs assistance   Sitting balance-Leahy Scale: Good       Standing balance-Leahy Scale: Fair Standing balance comment: Able to static stand and take steps without UE support; dynamic stability improved with BUE support                             Pertinent Vitals/Pain Pain Assessment: Faces Faces Pain Scale: Hurts a little bit Pain Location: Lower back Pain Descriptors / Indicators: Constant;Guarding Pain Intervention(s): Monitored during session    Home Living Family/patient expects to be discharged to:: Private residence Living Arrangements: Spouse/significant other(Girlfriend) Available Help at Discharge: Friend(s);Available 24 hours/day Type of Home: Apartment Home Access: Stairs to enter Entrance Stairs-Rails: Right Entrance Stairs-Number of Steps: 4-5 Home Layout: One level Home Equipment: None      Prior Function Level of Independence: Independent         Comments: Reports h/o dizziness, but does not occur daily. Able to perform household tasks; indep with ADLs. Does not drive due to decreased vision     Hand Dominance        Extremity/Trunk Assessment   Upper Extremity Assessment Upper Extremity Assessment: Overall WFL for tasks assessed    Lower Extremity Assessment Lower Extremity Assessment: Overall WFL for tasks assessed       Communication   Communication: HOH  Cognition Arousal/Alertness: Awake/alert Behavior During Therapy: WFL for tasks assessed/performed  Overall Cognitive Status: Within Functional Limits for tasks assessed                                 General Comments: WFL for simple tasks; not formally assessed      General Comments General comments (skin integrity, edema, etc.):  Orthostatic vital signs negative, pt denies dizziness throughout session    Exercises     Assessment/Plan    PT Assessment Patient needs continued PT services  PT Problem List Decreased activity tolerance;Decreased balance;Decreased mobility;Decreased knowledge of use of DME       PT Treatment Interventions DME instruction;Gait training;Stair training;Functional mobility training;Therapeutic activities;Therapeutic exercise;Balance training;Patient/family education    PT Goals (Current goals can be found in the Care Plan section)  Acute Rehab PT Goals Patient Stated Goal: Return home PT Goal Formulation: With patient Time For Goal Achievement: 01/25/19 Potential to Achieve Goals: Good    Frequency Min 3X/week   Barriers to discharge        Co-evaluation               AM-PAC PT "6 Clicks" Mobility  Outcome Measure Help needed turning from your back to your side while in a flat bed without using bedrails?: None Help needed moving from lying on your back to sitting on the side of a flat bed without using bedrails?: None Help needed moving to and from a bed to a chair (including a wheelchair)?: None Help needed standing up from a chair using your arms (e.g., wheelchair or bedside chair)?: None Help needed to walk in hospital room?: A Little Help needed climbing 3-5 steps with a railing? : A Little 6 Click Score: 22    End of Session Equipment Utilized During Treatment: Gait belt Activity Tolerance: Patient tolerated treatment well Patient left: in chair;with call bell/phone within reach;with chair alarm set Nurse Communication: Mobility status PT Visit Diagnosis: Other abnormalities of gait and mobility (R26.89)    Time: [id number] PT Time Calculation (min) (ACUTE ONLY): 28 min   Charges:   PT Evaluation $PT Eval Moderate Complexity: 1 Mod PT Treatments $Therapeutic Activity: 8-22 mins   Mabeline Caras, PT, DPT Acute Rehabilitation Services  Pager  [phone] Office [place] 01/11/2019, 11:01 AM

## 2019-01-11 NOTE — Plan of Care (Signed)
  Problem: Activity: Goal: Risk for activity intolerance will decrease Outcome: Progressing   Problem: Safety: Goal: Ability to remain free from injury will improve Outcome: Progressing   

## 2019-01-11 NOTE — Progress Notes (Signed)
Physical Therapy Treatment Patient Details Name: Logan Brown. MRN: [id number] DOB: Oct 16, 1931 Today's Date: 01/11/2019    History of Present Illness Pt is an 85 y.o. male admitted 01/10/19 with 70-month history of dizziness spells. (+) orthostatic hypotension in ED. PMH includes prostate CA with mets to bone, afib (not on anticoag), HTN, anxiety, nonischemic cardiomyopathy, DDD, chronic pain syndrome, CKD III.   PT Comments    Pt progressing well with mobility. Performed gait training with and without DME; pt's stability improved with rollator use, which also allows for added energy conservation. Continue to recommend this for home and community ambulation. Pt asymptomatic with mobility.    Follow Up Recommendations  No PT follow up;Supervision for mobility/OOB     Equipment Recommendations  Other (comment)(rollator, shower seat)    Recommendations for Other Services       Precautions / Restrictions Precautions Precautions: Fall Restrictions Weight Bearing Restrictions: No    Mobility  Bed Mobility               General bed mobility comments: Received sitting in recliner  Transfers Overall transfer level: Modified independent Equipment used: 4-wheeled walker;None                Ambulation/Gait Ambulation/Gait assistance: Min Gaffer (Feet): 400 Feet Assistive device: None;4-wheeled walker Gait Pattern/deviations: Step-through pattern;Decreased stride length;Trunk flexed   Gait velocity interpretation: 1.31 - 2.62 ft/sec, indicative of limited community ambulator General Gait Details: Initial ambulation without DME, min guard for balance; additional gait training with rollator, supervision for safety, educ on locking brakes and backing to wall for seated rest in hallway   Stairs             Wheelchair Mobility    Modified Rankin (Stroke Patients Only)       Balance Overall balance assessment: Needs assistance    Sitting balance-Leahy Scale: Good       Standing balance-Leahy Scale: Fair                              Cognition Arousal/Alertness: Awake/alert Behavior During Therapy: WFL for tasks assessed/performed Overall Cognitive Status: Within Functional Limits for tasks assessed                                 General Comments: WFL for simple tasks; not formally assessed      Exercises      General Comments        Pertinent Vitals/Pain Pain Assessment: No/denies pain    Home Living                      Prior Function            PT Goals (current goals can now be found in the care plan section) Progress towards PT goals: Progressing toward goals    Frequency    Min 3X/week      PT Plan Current plan remains appropriate    Co-evaluation              AM-PAC PT "6 Clicks" Mobility   Outcome Measure  Help needed turning from your back to your side while in a flat bed without using bedrails?: None Help needed moving from lying on your back to sitting on the side of a flat bed without using bedrails?: None Help needed moving to and from  a bed to a chair (including a wheelchair)?: None Help needed standing up from a chair using your arms (e.g., wheelchair or bedside chair)?: None Help needed to walk in hospital room?: A Little Help needed climbing 3-5 steps with a railing? : A Little 6 Click Score: 22    End of Session Equipment Utilized During Treatment: Gait belt Activity Tolerance: Patient tolerated treatment well Patient left: in chair;with call bell/phone within reach;with chair alarm set Nurse Communication: Mobility status PT Visit Diagnosis: Other abnormalities of gait and mobility (R26.89)     Time: [id number] PT Time Calculation (min) (ACUTE ONLY): 18 min  Charges:  $Gait Training: 8-22 mins                    Mabeline Caras, PT, DPT Acute Rehabilitation Services  Pager [phone] Office  [place] 01/11/2019, 5:37 PM

## 2019-01-12 LAB — BASIC METABOLIC PANEL
Anion gap: 7 (ref 5–15)
BUN: 26 mg/dL — ABNORMAL HIGH (ref 8–23)
CO2: 21 mmol/L — ABNORMAL LOW (ref 22–32)
Calcium: 8.8 mg/dL — ABNORMAL LOW (ref 8.9–10.3)
Chloride: 112 mmol/L — ABNORMAL HIGH (ref 98–111)
Creatinine, Ser: 1.81 mg/dL — ABNORMAL HIGH (ref 0.61–1.24)
GFR calc Af Amer: 39 mL/min — ABNORMAL LOW (ref >60)
GFR calc non Af Amer: 33 mL/min — ABNORMAL LOW (ref >60)
Glucose, Bld: 109 mg/dL — ABNORMAL HIGH (ref 70–99)
Potassium: 5.8 mmol/L — ABNORMAL HIGH (ref 3.5–5.1)
Sodium: 140 mmol/L (ref 135–145)

## 2019-01-12 LAB — FOLATE RBC
Folate, Hemolysate: >620 ng/mL
Folate, RBC: >2088 ng/mL (ref >498)
Hematocrit: 29.7 % — ABNORMAL LOW (ref 37.5–51.0)

## 2019-01-12 NOTE — Discharge Summary (Signed)
Physician Discharge Summary  Logan Brown. [id number] DOB: September 25, 1931 DOA: 01/10/2019  PCP: [name], MD  Admit date: 01/10/2019 Discharge date: 01/12/2019  Admitted From: home Disposition: home  Recommendations for Outpatient Follow-up:  1. Follow up with PCP in 1 weeks 2. Please obtain BMP in one week 3. Please follow up on the following pending results:  Home Health: none Equipment/Devices:none  Discharge Condition: Stable CODE STATUS: FULL Diet recommendation: Heart Healthy  Brief/Interim Summary:  85 y.o.malewith medical history significant ofprostate cancer with metastasis to bone, A. Fib-not on anticoagulation due to hematuria, hypertension, anxiety, nonischemic cardiomyopathy with ejection fraction of 50 to 55%, DDD, chronic pain syndrome, CKD stage III, GERD presented to emergency department due to dizziness. Patient reports that he has intermittent dizzy spells since 85-month.  No other complaint, no neurological symptoms.His girlfriend called EMS-patient was found to be hypotensive with blood pressure of 97/50.  Reportedly his losartan dose was decreased from 100 mg to 50 mg recently due to dizzy spells.  No melena or hematemesis.  Upon arrival to ED patient's blood pressure was low. Orthostatic vitals positive. CT head and chest x-ray negative for acute findings. CMP shows AKI on CKD. CBC shows macrocytosis. Ethanol level: WNL. UA and COVID-19 negative. He received IV fluidsin ED and was subsequently admitted to hospital service. Patient was admitted hydrated with IV fluid. Creatinine down trended orthostatic vitals negative. He is ambulatory, seen by PT-no further PT needed. Wants to go home today. Discharge Diagnoses:  Principal Problem:   Orthostatic hypotension Active Problems:   HTN (hypertension)   CKD (chronic kidney disease) stage 3, GFR 30-59 ml/min   Prostate cancer (HCC)   GERD (gastroesophageal reflux disease)   History of  prostate cancer   DDD (degenerative disc disease), lumbar   Metastatic cancer to spine (HCC)   Chronic diastolic CHF (congestive heart failure) (HCC)  Vertigo/orthostatic hypotension: Extensive chart review reveals that patient has a history of dizziness and vertigo and that patient was placed on Lasix 40 mg 3 times per week however due to miscommunication, he was taking it daily.  During his recent visit to cardiologist, he was once again told to take it 3 times a day and patient does tell me that he now takes 3 times a day.  To be noted that his Lasix has been prescribed to him due to edema.  He did not have any edema during recent cardiology outpatient visit and he does not have any edema today on my exam either.  After talking to him, he tells me that he drinks approximately 24 ounce of water every day instead of 64.  He was able he looks very dehydrated.  His orthostatic hypotension causing vertigo is likely combination of polypharmacy which includes losartan, Lasix and beta-blocker as well as not drinking enough water a day. It is felt that he should not be on any Lasix since he is not drinking enough water anyway. At this point in time, all of those medications are on hold, orthostatic vitals negative was hydrated with IV fluids with improvement in his renal functions.Is doing well and want to go home today.  Patient's significant other attributes his multiple BP meds to his dizziness and requesting to hold all of them at this point.  Agree with holding the medication and follow-up with PCP to resume and also check blood pressure at home.  AKI on CKD stage IIIA:His baseline creatinine is around 1.6 with GFR being around 47.His creatinine yesterday was 2.68-he was  started on IV fluids and creatinine improved to 2.07-bmp repeat with creat at 1.8. k is up at 5.8 but sample is hemolyzed.  I discussed with patient's significant other and patient about repeating to make sure kidneys okay but they are  anxious on going home now and report that they will see PCP Dr and check bmp in few days. They will also monitor blood pressure at home.  His AKI is likely secondary to dehydration along with being on diuretics/losartan.  Paroxysmal atrial fibrillation: Rate controlled.  Continue to hold labetalol.  Not on anticoagulation will up with his-cardiology.  History of nonischemic cardiomyopathy: Recent echo done in 2017 showed ejection fraction of 50%.  Has no symptoms regarding cardiomyopathy currently.  We are going to stop his diuretics at this time and advised follow-up with PCP/cardiology  History of prostate cancer metastasized to bone: Status post radical prostatectomy followed by external beam radiationin 2000 -Followed by oncology outpatient. On Lupron every 3 months and Xgeva every month -Followed by palliative care outpatient. -Takes oxycodone for pain control-we will hold for now due to hypotension.  Anxiety: Continue Xanax.  GERD: Continue PPI  DVT prophylaxis: SCD/Lovenox Code Status: DNR Family Communication: None present at bedside.  Patient reports he lives with a friend who is going to come and pick him up today. Disposition Plan: home today if creat stable   Discharge Instructions  Discharge Instructions    (HEART FAILURE PATIENTS) Call MD:  Anytime you have any of the following symptoms: 1) 3 pound weight gain in 24 hours or 5 pounds in 1 week 2) shortness of breath, with or without a dry hacking cough 3) swelling in the hands, feet or stomach 4) if you have to sleep on extra pillows at night in order to breathe.   Complete by: As directed    Diet - low sodium heart healthy   Complete by: As directed    Discharge instructions   Complete by: As directed    Please call call MD or return to ER for similar or worsening recurring problem that brought you to hospital or if any fever,nausea/vomiting,abdominal pain, uncontrolled pain, chest pain,  shortness of breath or  any other alarming symptoms.  Please follow-up your doctor as instructed in a week time and call the office for appointment.  Please avoid alcohol, smoking, or any other illicit substance and maintain healthy habits including taking your regular medications as prescribed.  You were cared for by a hospitalist during your hospital stay. If you have any questions about your discharge medications or the care you received while you were in the hospital after you are discharged, you can call the unit and ask to speak with the hospitalist on call if the hospitalist that took care of you is not available.  Once you are discharged, your primary care physician will handle any further medical issues. Please note that NO REFILLS for any discharge medications will be authorized once you are discharged, as it is imperative that you return to your primary care physician (or establish a relationship with a primary care physician if you do not have one) for your aftercare needs so that they can reassess your need for medications and monitor your lab values  Follow-up with your primary care physician to check renal function in 1 week we have stopped her diuretics Lasix, labetalol and losartan and discuss with PCP to see if you need to go back on at least 1 or 2 of them, monitor blood pressure  at home in the meantime.   Increase activity slowly   Complete by: As directed      Allergies as of 01/12/2019      Reactions   Ace Inhibitors Swelling   angioedema   Hydrocodone Nausea And Vomiting      Medication List    STOP taking these medications   furosemide 40 MG tablet Commonly known as: Lasix   labetalol 100 MG tablet Commonly known as: NORMODYNE   losartan 100 MG tablet Commonly known as: COZAAR     TAKE these medications   ALPRAZolam 1 MG tablet Commonly known as: XANAX Take 0.5-1 mg by mouth See admin instructions. Take 0.5mg  tablet by mouth in the morning and 1 tab by mouth in the evening.    aspirin EC 81 MG tablet Take 81 mg by mouth daily.   calcium carbonate 1500 (600 Ca) MG Tabs tablet Commonly known as: OSCAL Take 600 mg of elemental calcium by mouth 2 (two) times daily with a meal.   CENTRUM SILVER ADULT 50+ PO Take by mouth.   gabapentin 600 MG tablet Commonly known as: Neurontin Take 1/2 to 1 tablet 2 to 3 x /day for chronic pain What changed:   how much to take  how to take this  when to take this  additional instructions   omeprazole 20 MG capsule Commonly known as: PRILOSEC Take 1 capsule Daily for Heartburn & Indigestion What changed:   how much to take  how to take this  when to take this  additional instructions   oxyCODONE 5 MG immediate release tablet Commonly known as: Oxy IR/ROXICODONE Take 1 tablet (5 mg total) by mouth every 6 (six) hours as needed for up to 30 days for severe pain. Must last 30 days. What changed: additional instructions            Durable Medical Equipment  (From admission, onward)         Start     Ordered   01/12/19 1212  For home use only DME Shower stool  Once     01/12/19 1212   01/12/19 1212  For home use only DME 4 wheeled rolling walker with seat  Once    Question:  Patient needs a walker to treat with the following condition  Answer:  Weakness   01/12/19 1212         Follow-up Information    [name], MD Follow up in 1 week(s).   Specialty: Internal Medicine Why: Check renal function and discuss about cardiac medication Contact information: [address] [place] [address] [phone]        Minus Breeding, MD .   Specialty: Cardiology Contact information: [address] [place] [address] [phone]          Allergies  Allergen Reactions  . Ace Inhibitors Swelling    angioedema  . Hydrocodone Nausea And Vomiting    Consultations:  none  Procedures/Studies: Ct Head Wo Contrast  Result Date: 01/10/2019 CLINICAL  DATA:  Ataxia, dizziness.  Also with headache. EXAM: CT HEAD WITHOUT CONTRAST TECHNIQUE: Contiguous axial images were obtained from the base of the skull through the vertex without intravenous contrast. COMPARISON:  11/03/2016 FINDINGS: Brain: No evidence of acute infarction, hemorrhage, hydrocephalus, extra-axial collection or mass lesion/mass effect. There is ventricular enlargement reflecting mild atrophy. White matter hypoattenuation is also present consistent with mild chronic microvascular ischemic change. Vascular: No hyperdense vessel or unexpected calcification. Skull: Normal. Negative for  fracture or focal lesion. Sinuses/Orbits: Globes and orbits are unremarkable. Moderate mucosal thickening lines the left sphenoid sinus. Remaining sinuses and the mastoid air cells are clear. Other: None. IMPRESSION: 1. No acute intracranial abnormalities. 2. Mild atrophy and chronic microvascular ischemic change. 3. Moderate left sphenoid sinus mucosal thickening. Electronically Signed   By: Lajean Manes [name]   On: 01/10/2019 13:57   Dg Chest Portable 1 View  Result Date: 01/10/2019 CLINICAL DATA:  Generalized weakness and dizziness. EXAM: PORTABLE CHEST 1 VIEW COMPARISON:  05/10/2017 FINDINGS: Lungs are adequately inflated without consolidation or effusion. Borderline stable cardiomegaly. Remainder of the exam is unchanged. IMPRESSION: No acute cardiopulmonary disease. Borderline stable cardiomegaly. Electronically Signed   By: Marin Olp [name]   On: 01/10/2019 14:29    Subjective: Resting at the bedside chair.  Reports because of his cloth and tele wire/lines he could not get up on time to go to the bathroom and had accident.  Discharge Exam: Vitals:   01/12/19 0822 01/12/19 1152  BP: 139/68 133/62  Pulse: (!) 52 (!) 53  Resp: 18 18  Temp: 98.1 F (36.7 C) 98.6 F (37 C)  SpO2: 95% 96%   Vitals:   01/12/19 0429 01/12/19 0529 01/12/19 0822 01/12/19 1152  BP: 134/78  139/68 133/62  Pulse: (!)  55  (!) 52 (!) 53  Resp: 20  18 18   Temp: 97.8 F (36.6 C)  98.1 F (36.7 C) 98.6 F (37 C)  TempSrc: Oral  Oral Oral  SpO2: 97%  95% 96%  Weight:  85.6 kg    Height:        General: Pt is alert, awake, not in acute distress Cardiovascular: RRR, S1/S2 +, no rubs, no gallops Respiratory: CTA bilaterally, no wheezing, no rhonchi Abdominal: Soft, NT, ND, bowel sounds + Extremities: no edema, no cyanosis   The results of significant diagnostics from this hospitalization (including imaging, microbiology, ancillary and laboratory) are listed below for reference.     Microbiology: Recent Results (from the past 240 hour(s))  SARS CORONAVIRUS 2 (TAT 6-24 HRS) Nasopharyngeal Nasopharyngeal Swab     Status: None   Collection Time: 01/10/19  2:45 PM   Specimen: Nasopharyngeal Swab  Result Value Ref Range Status   SARS Coronavirus 2 NEGATIVE NEGATIVE Final    Comment: (NOTE) SARS-CoV-2 target nucleic acids are NOT DETECTED. The SARS-CoV-2 RNA is generally detectable in upper and lower respiratory specimens during the acute phase of infection. Negative results do not preclude SARS-CoV-2 infection, do not rule out co-infections with other pathogens, and should not be used as the sole basis for treatment or other patient management decisions. Negative results must be combined with clinical observations, patient history, and epidemiological information. The expected result is Negative. Fact Sheet for Patients: SugarRoll.be Fact Sheet for Healthcare Providers: https://www.woods-mathews.com/ This test is not yet approved or cleared by the [place] FDA and  has been authorized for detection and/or diagnosis of SARS-CoV-2 by FDA under an Emergency Use Authorization (EUA). This EUA will remain  in effect (meaning this test can be used) for the duration of the COVID-19 declaration under Section 56 4(b)(1) of the Act, 21 U.S.C. section  360bbb-3(b)(1), unless the authorization is terminated or revoked sooner. Performed at [facility], [place] [address], [place], [place] [address]      Labs: BNP (last 3 results) No results for input(s): BNP in the last 8760 hours. Basic Metabolic Panel: Recent Labs  Lab 01/10/19 1348 01/10/19 1546 01/11/19 0430 01/12/19  1058  NA 137  --  141 140  K 4.4  --  4.0 5.8*  CL 100  --  110 112*  CO2 25  --  23 21*  GLUCOSE 123*  --  100* 109*  BUN 44*  --  37* 26*  CREATININE 2.68* 2.54* 2.07* 1.81*  CALCIUM 9.1  --  8.2* 8.8*  MG  --  2.2  --   --   PHOS  --  4.6  --   --    Liver Function Tests: Recent Labs  Lab 01/10/19 1348  AST 26  ALT 26  ALKPHOS 54  BILITOT 0.5  PROT 7.0  ALBUMIN 4.1   No results for input(s): LIPASE, AMYLASE in the last 168 hours. No results for input(s): AMMONIA in the last 168 hours. CBC: Recent Labs  Lab 01/10/19 1348 01/10/19 1546 01/11/19 0430  WBC 6.9 7.0 5.4  NEUTROABS 5.0  --   --   HGB 12.4* 11.5* 10.3*  HCT 37.3* 34.3* 30.8*  MCV 105.7* 103.3* 103.4*  PLT 182 152 132*   Cardiac Enzymes: No results for input(s): CKTOTAL, CKMB, CKMBINDEX, TROPONINI in the last 168 hours. BNP: Invalid input(s): POCBNP CBG: No results for input(s): GLUCAP in the last 168 hours. D-Dimer No results for input(s): DDIMER in the last 72 hours. Hgb A1c No results for input(s): HGBA1C in the last 72 hours. Lipid Profile No results for input(s): CHOL, HDL, LDLCALC, TRIG, CHOLHDL, LDLDIRECT in the last 72 hours. Thyroid function studies Recent Labs    01/10/19 1624  TSH 2.096   Anemia work up Recent Labs    01/10/19 1546  VITAMINB12 2,350*   Urinalysis    Component Value Date/Time   COLORURINE YELLOW 01/11/2019 0116   APPEARANCEUR CLEAR 01/11/2019 0116   LABSPEC 1.013 01/11/2019 0116   PHURINE 6.0 01/11/2019 0116   GLUCOSEU NEGATIVE 01/11/2019 0116   HGBUR NEGATIVE 01/11/2019 0116   BILIRUBINUR NEGATIVE 01/11/2019 0116    KETONESUR NEGATIVE 01/11/2019 0116   PROTEINUR NEGATIVE 01/11/2019 0116   UROBILINOGEN 1.0 04/02/2009 1921   NITRITE NEGATIVE 01/11/2019 0116   LEUKOCYTESUR NEGATIVE 01/11/2019 0116   Sepsis Labs Invalid input(s): PROCALCITONIN,  WBC,  LACTICIDVEN Microbiology Recent Results (from the past 240 hour(s))  SARS CORONAVIRUS 2 (TAT 6-24 HRS) Nasopharyngeal Nasopharyngeal Swab     Status: None   Collection Time: 01/10/19  2:45 PM   Specimen: Nasopharyngeal Swab  Result Value Ref Range Status   SARS Coronavirus 2 NEGATIVE NEGATIVE Final    Comment: (NOTE) SARS-CoV-2 target nucleic acids are NOT DETECTED. The SARS-CoV-2 RNA is generally detectable in upper and lower respiratory specimens during the acute phase of infection. Negative results do not preclude SARS-CoV-2 infection, do not rule out co-infections with other pathogens, and should not be used as the sole basis for treatment or other patient management decisions. Negative results must be combined with clinical observations, patient history, and epidemiological information. The expected result is Negative. Fact Sheet for Patients: SugarRoll.be Fact Sheet for Healthcare Providers: https://www.woods-mathews.com/ This test is not yet approved or cleared by the [place] FDA and  has been authorized for detection and/or diagnosis of SARS-CoV-2 by FDA under an Emergency Use Authorization (EUA). This EUA will remain  in effect (meaning this test can be used) for the duration of the COVID-19 declaration under Section 56 4(b)(1) of the Act, 21 U.S.C. section 360bbb-3(b)(1), unless the authorization is terminated or revoked sooner. Performed at [facility], [place],  [place] [address]      Time coordinating discharge: 25 minutes  SIGNED:   Antonieta Pert, MD  [facility] 01/12/2019, 1:05 PM  If 7PM-7AM, please contact night-coverage [email or web address]

## 2019-01-12 NOTE — Progress Notes (Signed)
Pt. HR down to 39 non-sustained. Pt. Resting in bed. Asymtomatic. RN will inform Public house manager.

## 2019-01-13 ENCOUNTER — Telehealth: Payer: Self-pay | Admitting: *Deleted

## 2019-01-13 NOTE — Telephone Encounter (Signed)
I have attempted to contact this patient by phone with the following results: left message to return my call on answering machine.

## 2019-01-17 NOTE — Progress Notes (Signed)
Hospital follow up  Assessment and Plan: Hospital visit follow up for Orthostatic hypotension  Logan Brown was seen today for hospitalization follow-up.  Diagnoses and all orders for this visit:  Orthostatic hypotension Discussed medications at length with patient and significant other. STOP lasix and losartan Take Labetalol 100mg , half tablet twice a day Monitor blood pressure twice a day and record Goal is 130/80 or less  For b/p 110/60 or less AND feel bad, please let our office know. Follow up with cardiology and bring b/p log -     COMPLETE METABOLIC PANEL WITH GFR  Gastroesophageal reflux disease without esophagitis Doing well at this time Continue:  Diet discussed Monitor for triggers Avoid food with high acid content Avoid excessive cafeine Increase water intake  Anxiety Doing well at this time continue xanax with benefit    Acute renal failure superimposed on stage 3a chronic kidney disease, unspecified acute renal failure type (HCC) Stage 3a chronic kidney disease Increase fluids  Avoid NSAIDS Blood pressure control Monitor sugars  Will continue to monitor -     COMPLETE METABOLIC PANEL WITH GFR   Atrial fibrillation, unspecified type ([place]) Follows with Cardiology, next appointment 11/17   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 [facility]) Follows with pain management for this  Prostate cancer [facility]) Follows with oncology Contact to clarify Xgeva injection schedule    All medications were reviewed with patient and family and fully reconciled. All questions answered fully, and patient and family members were encouraged to call the office with any further questions or concerns. Discussed goal to avoid readmission related to this diagnosis.  Medications Discontinued During This Encounter  Medication Reason  . furosemide (LASIX) 40 MG tablet Discontinued by provider    CAN NOT DO FOR BCBS REGULAR OR MEDICARE Over 40 minutes of exam, counseling, chart  review, and complex, high/moderate level critical decision making was performed this visit.   Future Appointments  Date Time Provider [place]  01/25/2019  1:40 PM Minus Breeding, MD [facility]  03/22/2019  1:00 PM [facility] LAB 6 [facility] None  03/22/2019  1:30 PM Wyatt Portela, MD [facility] None  03/22/2019  2:15 PM [facility] [place] [facility]USH [facility] None  04/05/2019  1:20 PM Minus Breeding, MD [facility]  04/27/2019  3:30 PM [name], MD [facility] None  01/31/2020  3:00 PM [name], NP [facility] None     HPI 85 y.o.male presents for follow up for transition from recent hospitalization. Admit date to the hospital was 01/10/19, patient was discharged from the hospital on 01/12/19 and our clinical staff contacted the office the day after discharge to set up a follow up appointment. The discharge summary, medications, and diagnostic test results were reviewed before meeting with the patient. The patient was admitted for: Orthostatic hypotension.  He was having intermittent dizziness x30months. He was found to have AKI from CMP with macrocytosis, UA and COVID19 negative.  All of his blood pressure medication are on hold, Lasix 40mg , beta-blocker and ARB. He has history of cardiomyopathy, EF 50% (2017) He is following with oncology here in [place] and part of the year in [place]. He is receiving Xgeva monthly, but thinks he has missed some doses of this, as well at Leuproline with next infusion scheduled for [name] 2021.  Since discharge he resumed his blood pressure medications.  He was instructed to STOP these related to hypotension.  He is accompanied with his significant other Logan Brown.  She also was unclear of the instruction and reports  they misplaced the discharge hospital paper.   Home health is not involved, although outpatient palliative care is following with this patient.  Patient is ambulating with single prong cane.  Last office  visit 11/30/18, provide written prescription for rolling walker with seat to improve mobility, stability and ability to rest.  They have not taken this to medical supply store as of yet.  Images while in the hospital: Ct Head Wo Contrast  Result Date: 01/10/2019 CLINICAL DATA:  Ataxia, dizziness.  Also with headache. EXAM: CT HEAD WITHOUT CONTRAST TECHNIQUE: Contiguous axial images were obtained from the base of the skull through the vertex without intravenous contrast. COMPARISON:  11/03/2016 FINDINGS: Brain: No evidence of acute infarction, hemorrhage, hydrocephalus, extra-axial collection or mass lesion/mass effect. There is ventricular enlargement reflecting mild atrophy. White matter hypoattenuation is also present consistent with mild chronic microvascular ischemic change. Vascular: No hyperdense vessel or unexpected calcification. Skull: Normal. Negative for fracture or focal lesion. Sinuses/Orbits: Globes and orbits are unremarkable. Moderate mucosal thickening lines the left sphenoid sinus. Remaining sinuses and the mastoid air cells are clear. Other: None. IMPRESSION: 1. No acute intracranial abnormalities. 2. Mild atrophy and chronic microvascular ischemic change. 3. Moderate left sphenoid sinus mucosal thickening. Electronically Signed   By: Lajean Manes [name]   On: 01/10/2019 13:57   Dg Chest Portable 1 View  Result Date: 01/10/2019 CLINICAL DATA:  Generalized weakness and dizziness. EXAM: PORTABLE CHEST 1 VIEW COMPARISON:  05/10/2017 FINDINGS: Lungs are adequately inflated without consolidation or effusion. Borderline stable cardiomegaly. Remainder of the exam is unchanged. IMPRESSION: No acute cardiopulmonary disease. Borderline stable cardiomegaly. Electronically Signed   By: Marin Olp [name]   On: 01/10/2019 14:29        Current Outpatient Medications (Analgesics):  .  aspirin EC 81 MG tablet, Take 81 mg by mouth daily. [name] Kitchen  oxyCODONE (OXY IR/ROXICODONE) 5 MG immediate release  tablet, Take 1 tablet (5 mg total) by mouth every 6 (six) hours as needed for up to 30 days for severe pain. Must last 30 days. (Patient not taking: Reported on 01/18/2019)   Current Outpatient Medications (Other):  [name] Kitchen  ALPRAZolam (XANAX) 1 MG tablet, Take 0.5-1 mg by mouth See admin instructions. Take 0.5mg  tablet by mouth in the morning and 1 tab by mouth in the evening. .  calcium carbonate (OSCAL) 1500 (600 Ca) MG TABS tablet, Take 600 mg of elemental calcium by mouth 2 (two) times daily with a meal. .  gabapentin (NEURONTIN) 600 MG tablet, Take 1/2 to 1 tablet 2 to 3 x /day for chronic pain (Patient taking differently: Take 600 mg by mouth 3 (three) times daily. ) .  Multiple Vitamins-Minerals (CENTRUM SILVER ADULT 50+ PO), Take by mouth. [name] Kitchen  omeprazole (PRILOSEC) 20 MG capsule, Take 1 capsule Daily for Heartburn & Indigestion (Patient taking differently: Take 20 mg by mouth daily. )  Past Medical History:  Diagnosis Date  . Arthritis   . Atrial fibrillation ([place])   . Bone cancer ([place])   . Cardiomyopathy ([place])   . Chronic kidney disease   . Hyperlipidemia   . Hypertension   . Prostate cancer ([place]) 1997  . Unstable gait 04/16/2018   unstable gait in home     Allergies  Allergen Reactions  . Ace Inhibitors Swelling    angioedema  . Hydrocodone Nausea And Vomiting    ROS: all negative except above.   Physical Exam: Filed Weights   01/18/19 1358  Weight: 188 lb (85.3 kg)  BP 114/70   Pulse 67   Temp 98.1 F (36.7 C)   Ht 5\' 7"  (1.702 m)   Wt 188 lb (85.3 kg)   SpO2 95%   BMI 29.44 kg/m  General Appearance: Well nourished, in no apparent distress. Eyes: PERRLA, EOMs, conjunctiva no swelling or erythema Sinuses: No Frontal/maxillary tenderness ENT/Mouth: Ext aud canals clear, TMs without erythema, bulging. No erythema, swelling, or exudate on post pharynx.  Tonsils not swollen or erythematous. Hard of hearing. Neck: Supple, thyroid normal.  Respiratory: Respiratory  effort normal, BS equal bilaterally without rales, rhonchi, wheezing or stridor.  Cardio: RRR with no MRGs. Brisk peripheral pulses without edema.  Abdomen: Soft, + BS.  Non tender, no guarding, rebound, hernias, masses. Lymphatics: Non tender without lymphadenopathy.  Musculoskeletal: Full ROM, 5/5 strength, antalgic gait, uses cane.  Skin: Warm, dry flaky, without rashes, lesions, ecchymosis.  Neuro: Cranial nerves intact. Normal muscle tone, no cerebellar symptoms. Sensation intact.  Psych: Awake and oriented X 3, normal affect, Insight and Judgment appropriate.     [name], NP 12:51 PM [facility]

## 2019-01-18 ENCOUNTER — Ambulatory Visit: Payer: Self-pay | Admitting: Adult Health

## 2019-01-18 ENCOUNTER — Encounter: Payer: Self-pay | Admitting: Adult Health Nurse Practitioner

## 2019-01-18 ENCOUNTER — Ambulatory Visit (INDEPENDENT_AMBULATORY_CARE_PROVIDER_SITE_OTHER): Payer: Commercial Managed Care - HMO | Admitting: Adult Health Nurse Practitioner

## 2019-01-18 ENCOUNTER — Telehealth: Payer: Self-pay | Admitting: *Deleted

## 2019-01-18 ENCOUNTER — Other Ambulatory Visit: Payer: Self-pay

## 2019-01-18 VITALS — BP 114/70 | HR 67 | Temp 98.1°F | Ht 67.0 in | Wt 188.0 lb

## 2019-01-18 DIAGNOSIS — F419 Anxiety disorder, unspecified: Secondary | ICD-10-CM | POA: Diagnosis not present

## 2019-01-18 DIAGNOSIS — K219 Gastro-esophageal reflux disease without esophagitis: Secondary | ICD-10-CM | POA: Diagnosis not present

## 2019-01-18 DIAGNOSIS — N179 Acute kidney failure, unspecified: Secondary | ICD-10-CM

## 2019-01-18 DIAGNOSIS — N1831 Chronic kidney disease, stage 3a: Secondary | ICD-10-CM

## 2019-01-18 DIAGNOSIS — I4891 Unspecified atrial fibrillation: Secondary | ICD-10-CM | POA: Diagnosis not present

## 2019-01-18 DIAGNOSIS — I951 Orthostatic hypotension: Secondary | ICD-10-CM | POA: Diagnosis not present

## 2019-01-18 DIAGNOSIS — G893 Neoplasm related pain (acute) (chronic): Secondary | ICD-10-CM

## 2019-01-18 DIAGNOSIS — C7951 Secondary malignant neoplasm of bone: Secondary | ICD-10-CM

## 2019-01-18 DIAGNOSIS — C61 Malignant neoplasm of prostate: Secondary | ICD-10-CM

## 2019-01-18 NOTE — Telephone Encounter (Signed)
Called patient on 01/18/2019 , 8:55 AM in an attempt to reach the patient for a hospital follow up.   Admit date: 01/10/19 Discharge: 01/12/19   He does not have any questions or concerns about medications from the hospital admission. [name] were reviewed over the phone, they were counseled to bring in all current medications to the hospital follow up visit.   I advised the patient to call if any questions or concerns arise about the hospital admission or medications    Home health was not started in the hospital.  All questions were answered and a follow up appointment was made. Patient called and scheduled a follow up for today with [name], NP.  Prior to Admission medications   Medication Sig Start Date End Date Taking? Authorizing Provider  ALPRAZolam Duanne Moron) 1 MG tablet Take 0.5-1 mg by mouth See admin instructions. Take 0.5mg  tablet by mouth in the morning and 1 tab by mouth in the evening. 01/02/19   [provider]  aspirin EC 81 MG tablet Take 81 mg by mouth daily.    [provider]  calcium carbonate (OSCAL) 1500 (600 Ca) MG TABS tablet Take 600 mg of elemental calcium by mouth 2 (two) times daily with a meal.    [provider]  gabapentin (NEURONTIN) 600 MG tablet Take 1/2 to 1 tablet 2 to 3 x /day for chronic pain Patient taking differently: Take 600 mg by mouth 3 (three) times daily.  12/20/18   Liane Comber, NP  Multiple Vitamins-Minerals (CENTRUM SILVER ADULT 50+ PO) Take by mouth.    [provider]  omeprazole (PRILOSEC) 20 MG capsule Take 1 capsule Daily for Heartburn & Indigestion Patient taking differently: Take 20 mg by mouth daily.  12/20/18   Liane Comber, NP  oxyCODONE (OXY IR/ROXICODONE) 5 MG immediate release tablet Take 1 tablet (5 mg total) by mouth every 6 (six) hours as needed for up to 30 days for severe pain. Must last 30 days. Patient taking differently: Take 5 mg by mouth every 6 (six) hours  as needed for severe pain.  05/13/18 01/10/19  [name], MD

## 2019-01-18 NOTE — Patient Instructions (Addendum)
   1. Norva Karvonen - Call Dr Ulyses Southward office to discuss your next injection.  2. We will contact you in 1-3 days for your labs reults.   3. STOP LASIX and LOSARTAN  4.Take Labetalol 100mg , take half a tablet twice a day.  5. Check blood pressure twice a day and write this down.  If your blood pressure is 110/60 or less AND you feel bad, stop this medication contact our office to let us know.    Follow up with Cardiology November 17.

## 2019-01-19 LAB — COMPLETE METABOLIC PANEL WITH GFR
AG Ratio: 1.8 (calc) (ref 1.0–2.5)
ALT: 19 U/L (ref 9–46)
AST: 21 U/L (ref 10–35)
Albumin: 4.1 g/dL (ref 3.6–5.1)
Alkaline phosphatase (APISO): 48 U/L (ref 35–144)
BUN/Creatinine Ratio: 18 (calc) (ref 6–22)
BUN: 30 mg/dL — ABNORMAL HIGH (ref 7–25)
CO2: 25 mmol/L (ref 20–32)
Calcium: 9.5 mg/dL (ref 8.6–10.3)
Chloride: 109 mmol/L (ref 98–110)
Creat: 1.63 mg/dL — ABNORMAL HIGH (ref 0.70–1.11)
GFR, Est African American: 44 mL/min/{1.73_m2} — ABNORMAL LOW (ref >=60)
GFR, Est Non African American: 38 mL/min/{1.73_m2} — ABNORMAL LOW (ref >=60)
Globulin: 2.3 g/dL (calc) (ref 1.9–3.7)
Glucose, Bld: 86 mg/dL (ref 65–99)
Potassium: 5 mmol/L (ref 3.5–5.3)
Sodium: 141 mmol/L (ref 135–146)
Total Bilirubin: 0.5 mg/dL (ref 0.2–1.2)
Total Protein: 6.4 g/dL (ref 6.1–8.1)

## 2019-01-24 NOTE — Progress Notes (Signed)
Cardiology Office Note   Date:  01/25/2019   ID:  Logan Miyamoto., DOB 04-30-1931, MRN [id number]  PCP:  Logan Pinto, MD  Cardiologist:   Minus Breeding, MD   Chief Complaint  Patient presents with  . Dizziness     History of Present Illness: Logan Kirchen. is a 85 y.o. male who is referred by Logan Pinto, MD for follow up of cardiomyopathy.  He had a previous EF of 25%.  However, in 2017 it was checked and it was 50 - 55%.  I suspect he was in atrial fibrillation as he is on amiodarone now.  He did not have a catheterization.  He was on Xarelto until he had hematuria and this was stopped.  When I saw him in [name] he was on amiodarone and I continued this.  However, he is off of this now for unclear reasons.   He had orthostasis when he was seen by Logan Brown in October.  He was hospitalized with dizziness earlier this month.  I reviewed these records for this visit.   He was orthostatic and had acute on chronic renal insufficiency.  He was hydrated.   There was a mention of paroxysmal atrial fib.  However, I looked at the EKGs, progress notes and rhythm strips and could not find PAF.    He was sent home off of Lasix and losartan.     He has been doing relatively well since he got out of the Brown.  He is reporting palpitations once or twice a week lasting for about 15 minutes.  However he has not had any presyncope or syncope.  He has less dizziness than he did previously.  He is not describing chest pressure, neck or arm discomfort.  Is not been having any new shortness of breath, PND or orthopnea.  He has no weight gain or edema.  Past Medical History:  Diagnosis Date  . Arthritis   . Atrial fibrillation ([place])   . Bone cancer ([place])   . Cardiomyopathy ([place])   . Chronic kidney disease   . Hyperlipidemia   . Hypertension   . Prostate cancer ([place]) 1997  . Unstable gait 04/16/2018   unstable gait in home    Past Surgical History:  Procedure  Laterality Date  . CATARACT EXTRACTION    . EYE SURGERY Bilateral    IOL/CE on Lt in 1998 and Rt in 2011.  [name] Kitchen PROSTATE SURGERY    . WRIST FOREIGN BODY REMOVAL     1957 glass     Current Outpatient Medications  Medication Sig Dispense Refill  . ALPRAZolam (XANAX) 1 MG tablet Take 0.5-1 mg by mouth See admin instructions. Take 0.5mg  tablet by mouth in the morning and 1 tab by mouth in the evening.    [name] Kitchen aspirin EC 81 MG tablet Take 81 mg by mouth daily.    . calcium carbonate (OSCAL) 1500 (600 Ca) MG TABS tablet Take 600 mg of elemental calcium by mouth 2 (two) times daily with a meal.    . gabapentin (NEURONTIN) 600 MG tablet Take 1/2 to 1 tablet 2 to 3 x /day for chronic pain (Patient taking differently: Take 600 mg by mouth 3 (three) times daily. ) 270 tablet 1  . labetalol (NORMODYNE) 100 MG tablet Take 50 mg by mouth 2 (two) times daily.    . Multiple Vitamins-Minerals (CENTRUM SILVER ADULT 50+ PO) Take by mouth.    [name] Kitchen omeprazole (PRILOSEC) 20 MG capsule  Take 1 capsule Daily for Heartburn & Indigestion (Patient taking differently: Take 20 mg by mouth daily. ) 90 capsule 1  . oxyCODONE (OXY IR/ROXICODONE) 5 MG immediate release tablet Take 1 tablet (5 mg total) by mouth every 6 (six) hours as needed for up to 30 days for severe pain. Must last 30 days. (Patient not taking: Reported on 01/18/2019) 120 tablet 0   No current facility-administered medications for this visit.     Allergies:   Ace inhibitors and Hydrocodone     ROS:  Please see the history of present illness.   Otherwise, review of systems are positive for insomnia.   All other systems are reviewed and negative.    PHYSICAL EXAM: VS:  BP (!) 152/84   Pulse 70   Ht 5\' 7"  (1.702 m)   Wt 187 lb 3.2 oz (84.9 kg)   BMI 29.32 kg/m  , BMI Body mass index is 29.32 kg/m. GENERAL:  Well appearing, looks younger than stated age NECK:  No jugular venous distention, waveform within normal limits, carotid upstroke brisk and  symmetric, no bruits, no thyromegaly LUNGS:  Clear to auscultation bilaterally CHEST:  Unremarkable HEART:  PMI not displaced or sustained,S1 and S2 within normal limits, no S3, no S4, no clicks, no rubs, no murmurs ABD:  Flat, positive bowel sounds normal in frequency in pitch, no bruits, no rebound, no guarding, no midline pulsatile mass, no hepatomegaly, no splenomegaly EXT:  2 plus pulses throughout, no edema, no cyanosis no clubbing    EKG:  EKG is  not ordered today.  Recent Labs: 01/10/2019: Magnesium 2.2; TSH 2.096 01/11/2019: Hemoglobin 10.3; Platelets 132 01/18/2019: ALT 19; BUN 30; Creat 1.63; Potassium 5.0; Sodium 141    Lipid Panel    Component Value Date/Time   CHOL 203 (H) 06/01/2017 1544   TRIG 257 (H) 06/01/2017 1544   HDL 62 06/01/2017 1544   CHOLHDL 3.3 06/01/2017 1544   VLDL NOT CALC 03/01/2015 1742   LDLCALC 104 (H) 06/01/2017 1544     Lab Results  Component Value Date   TSH 2.096 01/10/2019   ALT 19 01/18/2019   AST 21 01/18/2019   ALKPHOS 54 01/10/2019   BILITOT 0.5 01/18/2019   PROT 6.4 01/18/2019   ALBUMIN 4.1 01/10/2019     Wt Readings from Last 3 Encounters:  01/25/19 187 lb 3.2 oz (84.9 kg)  01/18/19 188 lb (85.3 kg)  01/12/19 188 lb 12.8 oz (85.6 kg)      Other studies Reviewed: Additional studies/ records that were reviewed today include: [facility] admission Review of the above records demonstrates:  See elsewhere    ASSESSMENT AND PLAN:  CARDIOMYOPATHY:    Seems to be euvolemic.  No further imaging at this time.  No change in therapy.  HTN: Blood pressure is elevated.  I spoke with his friend who monitors him closely and again keep a twice daily blood pressure diary and I will react to this.  He might need amlodipine.  I am going to keep him off of possible nephrotoxic agents.  HYPERLIPIDEMIA:    His triglycerides have not been checked since last year and I gave him instructions to try to get this done with his primary care doctor  next time he is there for an a.m. appointment.   CAROTID STENOSIS:  He had mild plaque in July 2018.  I will follow this up in the future but will not schedule imaging right now.    ATRIAL FIB: He is going  to need a 2-week event monitor.  Neither he nor his caregiver understand what happened to the amiodarone we may have to restart this.  He cannot take anticoagulation.  CKD III:  The last creat was 1.63.  This is improved.  I reviewed recent labs.  DYSPNEA: This is improved.  No change in therapy.   Current medicines are reviewed at length with the patient today.  The patient does not have concerns regarding medicines.  The following changes have been made:  no change  Labs/ tests ordered today include:   Orders Placed This Encounter  Procedures  . Cardiac event monitor     Disposition:   FU with after the event monitor.     Signed, Minus Breeding, MD  01/25/2019 2:43 PM    [facility]

## 2019-01-25 ENCOUNTER — Other Ambulatory Visit: Payer: Self-pay

## 2019-01-25 ENCOUNTER — Ambulatory Visit: Payer: Commercial Managed Care - HMO | Admitting: Cardiology

## 2019-01-25 ENCOUNTER — Encounter: Payer: Self-pay | Admitting: Cardiology

## 2019-01-25 VITALS — BP 152/84 | HR 70 | Ht 67.0 in | Wt 187.2 lb

## 2019-01-25 DIAGNOSIS — E785 Hyperlipidemia, unspecified: Secondary | ICD-10-CM | POA: Diagnosis not present

## 2019-01-25 DIAGNOSIS — R0602 Shortness of breath: Secondary | ICD-10-CM | POA: Diagnosis not present

## 2019-01-25 DIAGNOSIS — I10 Essential (primary) hypertension: Secondary | ICD-10-CM | POA: Diagnosis not present

## 2019-01-25 DIAGNOSIS — I4891 Unspecified atrial fibrillation: Secondary | ICD-10-CM

## 2019-01-25 DIAGNOSIS — R42 Dizziness and giddiness: Secondary | ICD-10-CM

## 2019-01-25 DIAGNOSIS — N1831 Chronic kidney disease, stage 3a: Secondary | ICD-10-CM

## 2019-01-25 NOTE — Patient Instructions (Addendum)
Medication Instructions:  Your physician recommends that you continue on your current medications as directed. Please refer to the Current Medication list given to you today.  If you need a refill on your cardiac medications before your next appointment, please call your pharmacy.   Lab work: NONE  Testing/Procedures: Your physician has recommended that you wear a 2 week event monitor. Event monitors are medical devices that record the heart's electrical activity. Doctors most often [place] these monitors to diagnose arrhythmias. Arrhythmias are problems with the speed or rhythm of the heartbeat. The monitor is a small, portable device. You can wear one while you do your normal daily activities. This is usually used to diagnose what is causing palpitations/syncope (passing out).   Follow-Up: At [facility], you and your health needs are our priority.  As part of our continuing mission to provide you with exceptional heart care, we have created designated Provider Care Teams.  These Care Teams include your primary Cardiologist (physician) and Advanced Practice Providers (APPs -  Physician Assistants and Nurse Practitioners) who all work together to provide you with the care you need, when you need it. You may see Minus Breeding, MD or one of the following Advanced Practice Providers on your designated Care Team:    Rosaria Ferries, PA-C  Jory Sims, DNP, ANP  Cadence Kathlen Mody, NP  Your physician wants you to follow-up in: 1 month with a physicians assistant.  Any Other Special Instructions Will Be Listed Below (If Applicable).  [employer] Cardiac Event Monitor Instructions Your physician has requested you wear your cardiac event monitor for _____ days, (1-30). [employer] may call or text to confirm a shipping address. The monitor will be sent to a land address via UPS. [employer] will not ship a monitor to a PO BOX. It typically takes 3-5 days to receive your monitor after it has been  enrolled. [employer] will assist with USPS tracking if your package is delayed. The telephone number for [employer] is [phone]. Once you have received your monitor, please review the enclosed instructions. Instruction tutorials can also be viewed under help and settings on the enclosed cell phone. Your monitor has already been registered assigning a specific monitor serial # to you.  Applying the monitor Remove cell phone from case and turn it on. The cell phone works as Dealer and needs to be within [employer] of you at all times. The cell phone will need to be charged on a daily basis. We recommend you plug the cell phone into the enclosed charger at your bedside table every night.  Monitor batteries: You will receive two monitor batteries labelled #1 and #2. These are your recorders. Plug battery #2 onto the second connection on the enclosed charger. Keep one battery on the charger at all times. This will keep the monitor battery deactivated. It will also keep it fully charged for when you need to switch your monitor batteries. A small light will be blinking on the battery emblem when it is charging. The light on the battery emblem will remain on when the battery is fully charged.  Open package of a Monitor strip. Insert battery #1 into black hood on strip and gently squeeze monitor battery onto connection as indicated in instruction booklet. Set aside while preparing skin.  Choose location for your strip, vertical or horizontal, as indicated in the instruction booklet. Shave to remove all hair from location. There cannot be any lotions, oils, powders, or colognes on skin where monitor is to be applied.  Wipe skin clean with enclosed Saline wipe. Dry skin completely.  Peel paper labeled #1 off the back of the Monitor strip exposing the adhesive. Place the monitor on the chest in the vertical or horizontal position shown in the instruction booklet. One arrow on the monitor  strip must be pointing upward. Carefully remove paper labeled #2, attaching remainder of strip to your skin. Try not to create any folds or wrinkles in the strip as you apply it.  Firmly press and release the circle in the center of the monitor battery. You will hear a small beep. This is turning the monitor battery on. The heart emblem on the monitor battery will light up every 5 seconds if the monitor battery in turned on and connected to the patient securely. Do not push and hold the circle down as this turns the monitor battery off. The cell phone will locate the monitor battery. A screen will appear on the cell phone checking the connection of your monitor strip. This may read poor connection initially but change to good connection within the next minute. Once your monitor accepts the connection you will hear a series of 3 beeps followed by a climbing crescendo of beeps. A screen will appear on the cell phone showing the two monitor strip placement options. Touch the picture that demonstrates where you applied the monitor strip.  Your monitor strip and battery are waterproof. You are able to shower, bathe, or swim with the monitor on. They just ask you do not submerge deeper than 3 feet underwater. We recommend removing the monitor if you are swimming in a lake, river, or ocean.  Your monitor battery will need to be switched to a fully charged monitor battery approximately once a week. The cell phone will alert you of an action which needs to be made.  On the cell phone, tap for details to reveal connection status, monitor battery status, and cell phone battery status. The green dots indicates your monitor is in good status. A red dot indicates there is something that needs your attention.  To record a symptom, click the circle on the monitor battery. In 30-60 seconds a list of symptoms will appear on the cell phone. Select your symptom and tap save. Your monitor will record a sustained  or significant arrhythmia regardless of you clicking the button. Some patients do not feel the heart rhythm irregularities. [employer] will notify us of any serious or critical events.  Refer to instruction booklet for instructions on switching batteries, changing strips, the Do not disturb or Pause features, or any additional questions.  Call [employer] at [phone], to confirm your monitor is transmitting and record your baseline. They will answer any questions you may have regarding the monitor instructions at that time.  Returning the monitor to [place] all equipment back into blue box. Peel off strip of paper to expose adhesive and close box securely. There is a prepaid UPS shipping label on this box. Drop in a UPS drop box, or at a UPS facility like [employer]. You may also contact [employer] to arrange UPS to pick up monitor package at your home.

## 2019-01-26 ENCOUNTER — Other Ambulatory Visit: Payer: Commercial Managed Care - HMO | Admitting: Hospice

## 2019-01-26 DIAGNOSIS — Z515 Encounter for palliative care: Secondary | ICD-10-CM

## 2019-01-26 DIAGNOSIS — C61 Malignant neoplasm of prostate: Secondary | ICD-10-CM

## 2019-01-26 NOTE — Progress Notes (Signed)
[place] Consult Note Telephone: [phone]  Fax: [phone]  PATIENT NAME: Logan Brown. DOB: Dec 15, 1931 MRN: [id number]  PRIMARY CARE PROVIDER:   [name], MD  REFERRING PROVIDER:  [name], MD [address] [place],  [place] [address]  RESPONSIBLE PARTY:  Self [id number] Extended Emergency Contact Information Primary Emergency Contact: [name],Ginger [place] Mobile Phone: [phone] Relation: Friend  TELEHEALTH VISIT STATEMENT Due to the COVID-19 crisis, this visit was done via telephone from my office. It was initiated and consented to by this patient and/or family.  RECOMMENDATIONS/PLAN: 1. Advance Care Planning/Goals of Care: Telehealth visit consisted of building trust and follow up for palliative care.Patient remains a DNR. DNR and MOST forms uploaded in [place]. Goals include to maximize quality of life and symptom relief. 2. Symptom management:Patient endorsed shortness of breath on exertion but said it is way better since he was taken off Lasix and Losartan by his Cardiologist. He continues on [place] as ordered. He continues to have low back pain related to cancer mets to bone at the back; currently well managed with Oxycodone. He denied pain during visit and said his pain medication is effective. Patient had asked for ways to get access to his VA benefits and get his push chair for free. He currently ambulates with a cane; no fall since last visit. [facility] SW had reached out to patient and provided him with VA tel no to help him enrol in [place] services. He said he has initiated it but the process is slow. Encouraged continuing to follow up. He was agreeable. He had no complaints/concerns.   I spent 30 minutes providing this consultation,  from 11.00am to 11.30am. More than 50% of the time in this consultation was spent coordinating communication.   HISTORY OF  PRESENT ILLNESS:Logan Pilatois a 85 y.o.year oldmalewith multiple medical problems including arthritis, prostate cancer, bone pain, a fib. Palliative Care was asked to help address goals of care  CODE STATUS: DNR  PPS: 50% HOSPICE ELIGIBILITY/DIAGNOSIS: TBD  PAST MEDICAL HISTORY:  Past Medical History:  Diagnosis Date  . Arthritis   . Atrial fibrillation ([place])   . Bone cancer ([place])   . Cardiomyopathy ([place])   . Chronic kidney disease   . Hyperlipidemia   . Hypertension   . Prostate cancer ([place]) 1997  . Unstable gait 04/16/2018   unstable gait in home    SOCIAL HX:  Social History   Tobacco Use  . Smoking status: Former Smoker    Quit date: 03/14/1975    Years since quitting: 43.9  . Smokeless tobacco: Never Used  Substance Use Topics  . Alcohol use: No    Alcohol/week: 0.0 standard drinks    Comment: occ    ALLERGIES:  Allergies  Allergen Reactions  . Ace Inhibitors Swelling    angioedema  . Hydrocodone Nausea And Vomiting     PERTINENT MEDICATIONS:  Outpatient Encounter Medications as of 01/26/2019  Medication Sig  . ALPRAZolam (XANAX) 1 MG tablet Take 0.5-1 mg by mouth See admin instructions. Take 0.5mg  tablet by mouth in the morning and 1 tab by mouth in the evening.  [name] Kitchen aspirin EC 81 MG tablet Take 81 mg by mouth daily.  . calcium carbonate (OSCAL) 1500 (600 Ca) MG TABS tablet Take 600 mg of elemental calcium by mouth 2 (two) times daily with a meal.  . gabapentin (NEURONTIN) 600 MG tablet Take 1/2 to 1 tablet 2 to 3 x /  day for chronic pain (Patient taking differently: Take 600 mg by mouth 3 (three) times daily. )  . labetalol (NORMODYNE) 100 MG tablet Take 50 mg by mouth 2 (two) times daily.  . Multiple Vitamins-Minerals (CENTRUM SILVER ADULT 50+ PO) Take by mouth.  [name] Kitchen omeprazole (PRILOSEC) 20 MG capsule Take 1 capsule Daily for Heartburn & Indigestion (Patient taking differently: Take 20 mg by mouth daily. )  . oxyCODONE (OXY IR/ROXICODONE) 5 MG  immediate release tablet Take 1 tablet (5 mg total) by mouth every 6 (six) hours as needed for up to 30 days for severe pain. Must last 30 days. (Patient not taking: Reported on 01/18/2019)   No facility-administered encounter medications on file as of 01/26/2019.      Teodoro Spray, NP

## 2019-01-31 ENCOUNTER — Telehealth: Payer: Self-pay | Admitting: Oncology

## 2019-01-31 NOTE — Telephone Encounter (Signed)
Returned patient's phone call regarding scheduling an injection, per patient's request appointment has been added for 11/24.

## 2019-02-01 ENCOUNTER — Inpatient Hospital Stay: Payer: Medicare Other | Attending: Oncology

## 2019-02-01 ENCOUNTER — Telehealth: Payer: Self-pay | Admitting: Oncology

## 2019-02-01 ENCOUNTER — Other Ambulatory Visit: Payer: Self-pay

## 2019-02-01 VITALS — BP 142/72 | HR 72 | Temp 98.2°F | Resp 18

## 2019-02-01 DIAGNOSIS — S32020S Wedge compression fracture of second lumbar vertebra, sequela: Secondary | ICD-10-CM

## 2019-02-01 DIAGNOSIS — C61 Malignant neoplasm of prostate: Secondary | ICD-10-CM | POA: Insufficient documentation

## 2019-02-01 DIAGNOSIS — Z79899 Other long term (current) drug therapy: Secondary | ICD-10-CM | POA: Diagnosis not present

## 2019-02-01 DIAGNOSIS — C7951 Secondary malignant neoplasm of bone: Secondary | ICD-10-CM | POA: Insufficient documentation

## 2019-02-01 MED ORDER — DENOSUMAB 120 MG/1.7ML ~~LOC~~ SOLN
SUBCUTANEOUS | Status: AC
  Filled 2019-02-01: qty 1.7

## 2019-02-01 MED ORDER — DENOSUMAB 120 MG/1.7ML ~~LOC~~ SOLN
120.0000 mg | Freq: Once | SUBCUTANEOUS | Status: AC
  Administered 2019-02-01: 120 mg via SUBCUTANEOUS

## 2019-02-01 NOTE — Progress Notes (Signed)
Pt receiving Logan Brown today last Calcium was drawn on 01/18/2019 Dr. Alen Blew is ok using these labs today and would like him to receive xgeva at this time.

## 2019-02-01 NOTE — Patient Instructions (Signed)

## 2019-02-01 NOTE — Telephone Encounter (Signed)
Scheduled appt per 11/24 sch message - unable to reach pt . Left message with appt date and time .   

## 2019-02-07 ENCOUNTER — Ambulatory Visit: Admitting: Urology

## 2019-02-07 NOTE — Progress Notes (Signed)
 * * *    [name], [name] **DOB:** 05-26-1931 (85 yo M) **Acc No.** [id number] **DOS:**  02/07/2019    ---       **[name]**    ------    8 Y old Male, DOB: 23-Jul-1931    [address] [place], [place], [place] [address]    Home: [phone]    Provider: [name]        * * *    Telephone Encounter    ---    Answered by  Jaynie Crumble Date: 02/07/2019       Time: 12:10 PM    Reason  Message    ------            Action Taken                     Ronnett Pullin E 09/21/2018 12:41:44 PM > check with daughter to see if he will be back up in [place] for his 10/20 visit      [name],[name]  12/13/2018 2:15:34 PM > called lmom daughter's cell # asked for call back       [name],[name]  12/13/2018 3:50:54 PM > daughter called back to say pt is still in [place] and she doesn't know if and when he will be back   appt on 10/20 has been cancelled      [name] 12/13/2018 16:22:42 >can't lose him: call daughter again in a month or so      [name],[name]  02/07/2019 9:20:42 AM > pt still in [place]      [name] 02/07/2019 11:43:28 >lets put this in a tel encounter and and then send to me thanks      [name] 02/07/2019 01:42:11 PM > can you call his daughter and see if he can see a GU doc down there in [place].      [name],[name]  02/07/2019 2:34:00 PM > lmom for daughter to cb      [name],[name]  02/08/2019 2:16:40 PM > lmox x 2      [name],[name]  02/08/2019 4:57:02 PM > Never heard from daughter, pt called states he went to [facility] to see Dr. Alesia Richards, received his Lupron a few days ago.      [name] E 02/08/2019 5:06:40 PM > OK, lets try to a phone number etc. for this doc so we can get the notes from him.. thanks      [name],[name]  02/09/2019 11:49:48 AM > awaiting notes      [name],[name]  02/14/2019 4:24:31 PM > pls get notes, ty      [name],[name]  02/14/2019 4:45:13 PM > faxed record request to [facility]      [name],[name]   02/16/2019 9:16:45 AM > still no records..refaxed request      [name],[name]  02/18/2019 7:58:30 AM > notes arrived      [facility]  02/18/2019 4:43:14 PM > offer next available in [name]      [name],[name]  02/18/2019 4:51:24 PM > called pt   he is still in [place]  and will be for some time                    * * *                ---          * * *         Provider: [name] 02/07/2019    ---  Note generated by eClinicalWorks EMR/PM Software ([email or web address])

## 2019-02-09 ENCOUNTER — Telehealth: Payer: Self-pay | Admitting: *Deleted

## 2019-02-09 NOTE — Telephone Encounter (Signed)
[employer] to ship a 14 day cardiac event monitor to the patients home. 

## 2019-02-15 ENCOUNTER — Telehealth: Payer: Self-pay

## 2019-02-15 ENCOUNTER — Ambulatory Visit: Admitting: Internal Medicine

## 2019-02-15 NOTE — Other (Addendum)
 [facility] Intake Entered On:  02/14/2019 11:09     Performed On:  02/14/2019 11:09  by Alfred Levins               [facility] General Information/Subjective   Chief Complaint :   [facility] to complete intake and reconcile meds   Preferred Language :   Renaldo Reel - 02/14/2019 11:09    Patient Preferred Method of Communication   Phone Call

## 2019-02-15 NOTE — Other (Deleted)
 Patient:   [name], [name]            MRN: [id number]            FIN: [id number]               Age:   85 years     Sex:  Male     DOB:  09-20-1931   Associated Diagnoses:   None   Author:   Kem Kays MD, Mendel Corning      This telemedicine encounter was performed with informed consent and initiated by the patient. The author/clinician was in clinic, and the patient was in remote location.    Chief Complaint: prostate cancer    Oncologic/hematologic History:  He was recently admitted to the hospital s/p fall down the stairs, resulting in a back injury.  Extensive work-up including CT head, CT spine and MRI spine was done.  It revealed evidence of metastatic disease to the back and a mild compression fracture of L2.  There was no evidence of spinal cord compression.  PSA was checked and it returned high at 245.  He was treated conservatively for his fracture and was started on pain meds.  He was then started on Lupron and Casodex, as well as Xgeva. Casodex was then discontinued and he remained on the other two. For his bony met in the back, he underwent kyphoplasty. Repeat PSAs have been low.      Interval History:   Since his last visit here, he has been feeling well. He is currently in [place]. Tolerating treatment (Xgeva and Lupron) well.       ROS:  Constitutional: Negative  HEENT: Negative  Respiratory: Negative  Cardiovascular: Negative  Gastrointestinal: Negative  Genitourinary: Negative  Heme/Lymph: Negative  Endocrine: hot flashes  Immunologic: Negative  Musculoskeletal: back pain  Integumentary: Negative  Neurologic: Negative  Psychiatric: Negative  All other ROS: Negative       Allergies:  Allergies (1) Active Reaction  NKA None Documented  ALLERGIES         Home Meds:   Medication List     Active Medications         Prescribed             potassium phosphate-sodium phosphate: See Instructions, 1 tab twice a               day for 3 days, 6 tab(s), 0 Refill(s).         Documented             acetaminophen:  650 mg, 2 tab(s), PO, q4hr, PRN: Fever/Pain, Mild to               Moderate, 0 Refill(s).             Al hydroxide/Mg hydroxide/simethicone: 30 mL, PO, q4hr, PRN:               Dyspepsia, 0 Refill(s).             ALPRAZolam: See Instructions, 0.25 mg PO TID PRN, PRN: as needed for               anxiety, 0 Refill(s).             aspirin: 81 mg, Chewed, Daily, 0 Refill(s).             calcium carbonate: 600 mg, 1 tab(s), PO, Daily, 0 Refill(s).             cholecalciferol: 1,000 Int_Unit,  PO, Daily, 0 Refill(s).             codeine-guaifenesin: 10 mL, PO, q4hr, 10ml twice a day as needed, 0               Refill(s).             docusate: 100 mg, 1 cap(s), PO, BID, PRN: Constipation, 0 Refill(s).             fentaNYL: 25 mcg, q72hr, change q 3 days fentayl 25 mgPartial fill               upon request, 0 Refill(s).             gabapentin: 300 mg, 1 cap(s), PO, BID, 0 Refill(s).             lisinopril: 5 mg, 1 tab(s), PO, Daily, 0 Refill(s).             loperamide: 2 mg, 1 tab(s), PO, q6hr, as needed, 0 Refill(s).             omeprazole: 20 mg, PO, Daily, 0 Refill(s).             ondansetron: See Instructions, 1 tab(s) PO q8hr PRN, 0 Refill(s).             oxyCODONE: 5 mg, PO, as needed, 0 Refill(s).             polyethylene glycol 3350: 17 Gm, PO, Daily, PRN: Constipation, 0               Refill(s).     Medications Inactivated in the Last 72 Hours         No medications found.        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     Imaging thus far as well as the high PSA which indicates the diagnosis of stage IV prostate cancer.  He was recommended to have staging scans, CT chest/abdomen/pelvis and bone scan but he canceled these  secondary to the cost.  He was started on bicalutamide and then received his first dose of Lupron as few days later.  He also started [facility] for his bony mets. He is tolerating both these injections well. He will continue. Encouraged him to take Ca/Vit D suppl daily. He is currently in [place] and getting treatment there. Had PSA checked recently. I will obtain results for our records.  I believe he is due for a bone density. Will address it once he is back in town.     Patient is agreeable to plan.  He will return for follow-up in 6 months, when he is back in town.   He knows to call with questions/concerns anytime.                      Reviewed and electronically verified by:  Kem Kays MD, Mendel Corning  on:  02/15/2019 11:19Reviewed and electronically authenticated by:  Micheline Maze                                                                       on:  02/15/19 11:19

## 2019-02-15 NOTE — Other (Deleted)
 [email or web address] Nurse[facility] Intake Entered On:  02/14/2019 11:09     Performed On:  02/14/2019 11:09  by Alfred Levins               [facility] General Information/Subjective   Chief Complaint :   [facility] to complete intake and reconcile meds   Preferred Language :   Renaldo Reel - 02/14/2019 11:09    Patient Preferred Method of Communication   Phone Call

## 2019-02-15 NOTE — Other (Addendum)
 Patient:   [name], [name]            MRN: [id number]            FIN: [id number]               Age:   85 years     Sex:  Male     DOB:  09-20-1931   Associated Diagnoses:   None   Author:   Kem Kays MD, Mendel Corning      This telemedicine encounter was performed with informed consent and initiated by the patient. The author/clinician was in clinic, and the patient was in remote location.    Chief Complaint: prostate cancer    Oncologic/hematologic History:  He was recently admitted to the hospital s/p fall down the stairs, resulting in a back injury.  Extensive work-up including CT head, CT spine and MRI spine was done.  It revealed evidence of metastatic disease to the back and a mild compression fracture of L2.  There was no evidence of spinal cord compression.  PSA was checked and it returned high at 245.  He was treated conservatively for his fracture and was started on pain meds.  He was then started on Lupron and Casodex, as well as Xgeva. Casodex was then discontinued and he remained on the other two. For his bony met in the back, he underwent kyphoplasty. Repeat PSAs have been low.      Interval History:   Since his last visit here, he has been feeling well. He is currently in [place]. Tolerating treatment (Xgeva and Lupron) well.       ROS:  Constitutional: Negative  HEENT: Negative  Respiratory: Negative  Cardiovascular: Negative  Gastrointestinal: Negative  Genitourinary: Negative  Heme/Lymph: Negative  Endocrine: hot flashes  Immunologic: Negative  Musculoskeletal: back pain  Integumentary: Negative  Neurologic: Negative  Psychiatric: Negative  All other ROS: Negative       Allergies:  Allergies (1) Active Reaction  NKA None Documented  ALLERGIES         Home Meds:   Medication List     Active Medications         Prescribed             potassium phosphate-sodium phosphate: See Instructions, 1 tab twice a               day for 3 days, 6 tab(s), 0 Refill(s).         Documented             acetaminophen:  650 mg, 2 tab(s), PO, q4hr, PRN: Fever/Pain, Mild to               Moderate, 0 Refill(s).             Al hydroxide/Mg hydroxide/simethicone: 30 mL, PO, q4hr, PRN:               Dyspepsia, 0 Refill(s).             ALPRAZolam: See Instructions, 0.25 mg PO TID PRN, PRN: as needed for               anxiety, 0 Refill(s).             aspirin: 81 mg, Chewed, Daily, 0 Refill(s).             calcium carbonate: 600 mg, 1 tab(s), PO, Daily, 0 Refill(s).             cholecalciferol: 1,000 Int_Unit,  PO, Daily, 0 Refill(s).             codeine-guaifenesin: 10 mL, PO, q4hr, 10ml twice a day as needed, 0               Refill(s).             docusate: 100 mg, 1 cap(s), PO, BID, PRN: Constipation, 0 Refill(s).             fentaNYL: 25 mcg, q72hr, change q 3 days fentayl 25 mgPartial fill               upon request, 0 Refill(s).             gabapentin: 300 mg, 1 cap(s), PO, BID, 0 Refill(s).             lisinopril: 5 mg, 1 tab(s), PO, Daily, 0 Refill(s).             loperamide: 2 mg, 1 tab(s), PO, q6hr, as needed, 0 Refill(s).             omeprazole: 20 mg, PO, Daily, 0 Refill(s).             ondansetron: See Instructions, 1 tab(s) PO q8hr PRN, 0 Refill(s).             oxyCODONE: 5 mg, PO, as needed, 0 Refill(s).             polyethylene glycol 3350: 17 Gm, PO, Daily, PRN: Constipation, 0               Refill(s).     Medications Inactivated in the Last 72 Hours         No medications found.        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     Imaging thus far as well as the high PSA which indicates the diagnosis of stage IV prostate cancer.  He was recommended to have staging scans, CT chest/abdomen/pelvis and bone scan but he canceled these  secondary to the cost.  He was started on bicalutamide and then received his first dose of Lupron as few days later.  He also started [facility] for his bony mets. He is tolerating both these injections well. He will continue. Encouraged him to take Ca/Vit D suppl daily. He is currently in [place] and getting treatment there. Had PSA checked recently. I will obtain results for our records.  I believe he is due for a bone density. Will address it once he is back in town.     Patient is agreeable to plan.  He will return for follow-up in 6 months, when he is back in town.   He knows to call with questions/concerns anytime.                      Reviewed and electronically verified by:  Kem Kays MD, Mendel Corning  on:  02/15/2019 11:19Reviewed and electronically authenticated by:  Micheline Maze                                                                       on:  02/15/19 11:19

## 2019-02-15 NOTE — Telephone Encounter (Signed)
Received call from Dr. Marry Guan Gunturi's office in [place], [place], where patient is being seen during the summer months. Office requested for patient's most recent PSA to be faxed to office.  Received fax from [facility] in [place], [place] where patient is seen during summer months requesting for physician's last office note. Called patient to verify he is being seen at both offices. Per patient, ok to send information to Dr. Lindon Romp office and [facility]. Fax's sent to both office with requested information.

## 2019-02-21 ENCOUNTER — Emergency Department (HOSPITAL_COMMUNITY)
Admission: EM | Admit: 2019-02-21 | Discharge: 2019-02-21 | Disposition: A | Discharge status: Home / Self Care | Payer: Medicare Other | Attending: Emergency Medicine | Admitting: Emergency Medicine

## 2019-02-21 ENCOUNTER — Other Ambulatory Visit: Payer: Self-pay

## 2019-02-21 ENCOUNTER — Encounter (HOSPITAL_COMMUNITY): Payer: Self-pay

## 2019-02-21 ENCOUNTER — Emergency Department (HOSPITAL_COMMUNITY): Payer: Medicare Other

## 2019-02-21 DIAGNOSIS — Z8546 Personal history of malignant neoplasm of prostate: Secondary | ICD-10-CM | POA: Insufficient documentation

## 2019-02-21 DIAGNOSIS — G4489 Other headache syndrome: Secondary | ICD-10-CM | POA: Diagnosis not present

## 2019-02-21 DIAGNOSIS — C7951 Secondary malignant neoplasm of bone: Secondary | ICD-10-CM | POA: Insufficient documentation

## 2019-02-21 DIAGNOSIS — I5032 Chronic diastolic (congestive) heart failure: Secondary | ICD-10-CM | POA: Insufficient documentation

## 2019-02-21 DIAGNOSIS — I4891 Unspecified atrial fibrillation: Secondary | ICD-10-CM | POA: Insufficient documentation

## 2019-02-21 DIAGNOSIS — N183 Chronic kidney disease, stage 3 unspecified: Secondary | ICD-10-CM | POA: Insufficient documentation

## 2019-02-21 DIAGNOSIS — R42 Dizziness and giddiness: Secondary | ICD-10-CM | POA: Diagnosis not present

## 2019-02-21 DIAGNOSIS — R55 Syncope and collapse: Secondary | ICD-10-CM | POA: Diagnosis not present

## 2019-02-21 DIAGNOSIS — Z79899 Other long term (current) drug therapy: Secondary | ICD-10-CM | POA: Insufficient documentation

## 2019-02-21 DIAGNOSIS — Z87891 Personal history of nicotine dependence: Secondary | ICD-10-CM | POA: Diagnosis not present

## 2019-02-21 DIAGNOSIS — Z7982 Long term (current) use of aspirin: Secondary | ICD-10-CM | POA: Insufficient documentation

## 2019-02-21 DIAGNOSIS — R0902 Hypoxemia: Secondary | ICD-10-CM | POA: Diagnosis not present

## 2019-02-21 DIAGNOSIS — Z743 Need for continuous supervision: Secondary | ICD-10-CM | POA: Diagnosis not present

## 2019-02-21 DIAGNOSIS — I130 Hypertensive heart and chronic kidney disease with heart failure and stage 1 through stage 4 chronic kidney disease, or unspecified chronic kidney disease: Secondary | ICD-10-CM | POA: Insufficient documentation

## 2019-02-21 LAB — HEPATIC FUNCTION PANEL
ALT: 23 U/L (ref 0–44)
AST: 25 U/L (ref 15–41)
Albumin: 4.3 g/dL (ref 3.5–5.0)
Alkaline Phosphatase: 49 U/L (ref 38–126)
Bilirubin, Direct: 0.1 mg/dL (ref 0.0–0.2)
Indirect Bilirubin: 0.5 mg/dL (ref 0.3–0.9)
Total Bilirubin: 0.6 mg/dL (ref 0.3–1.2)
Total Protein: 7.5 g/dL (ref 6.5–8.1)

## 2019-02-21 LAB — BASIC METABOLIC PANEL
Anion gap: 12 (ref 5–15)
BUN: 24 mg/dL — ABNORMAL HIGH (ref 8–23)
CO2: 20 mmol/L — ABNORMAL LOW (ref 22–32)
Calcium: 9.5 mg/dL (ref 8.9–10.3)
Chloride: 109 mmol/L (ref 98–111)
Creatinine, Ser: 1.9 mg/dL — ABNORMAL HIGH (ref 0.61–1.24)
GFR calc Af Amer: 36 mL/min — ABNORMAL LOW (ref >60)
GFR calc non Af Amer: 31 mL/min — ABNORMAL LOW (ref >60)
Glucose, Bld: 137 mg/dL — ABNORMAL HIGH (ref 70–99)
Potassium: 4.3 mmol/L (ref 3.5–5.1)
Sodium: 141 mmol/L (ref 135–145)

## 2019-02-21 LAB — URINALYSIS, ROUTINE W REFLEX MICROSCOPIC
Bilirubin Urine: NEGATIVE
Glucose, UA: NEGATIVE mg/dL
Hgb urine dipstick: NEGATIVE
Ketones, ur: NEGATIVE mg/dL
Leukocytes,Ua: NEGATIVE
Nitrite: NEGATIVE
Protein, ur: NEGATIVE mg/dL
Specific Gravity, Urine: 1.008 (ref 1.005–1.030)
pH: 5 (ref 5.0–8.0)

## 2019-02-21 LAB — CBC
HCT: 38.5 % — ABNORMAL LOW (ref 39.0–52.0)
Hemoglobin: 12.5 g/dL — ABNORMAL LOW (ref 13.0–17.0)
MCH: 34.3 pg — ABNORMAL HIGH (ref 26.0–34.0)
MCHC: 32.5 g/dL (ref 30.0–36.0)
MCV: 105.8 fL — ABNORMAL HIGH (ref 80.0–100.0)
Platelets: 169 10*3/uL (ref 150–400)
RBC: 3.64 MIL/uL — ABNORMAL LOW (ref 4.22–5.81)
RDW: 13.5 % (ref 11.5–15.5)
WBC: 5.8 10*3/uL (ref 4.0–10.5)
nRBC: 0 % (ref 0.0–0.2)

## 2019-02-21 LAB — CBG MONITORING, ED: Glucose-Capillary: 144 mg/dL — ABNORMAL HIGH (ref 70–99)

## 2019-02-21 NOTE — ED Triage Notes (Signed)
Pt from home c.o near syncopal episode today with dizziness and headache, denies chest pain or SOB. Pt a.o, VSS

## 2019-02-21 NOTE — Discharge Instructions (Addendum)
Labs without significant abnormalities.  Do have signs of renal insufficiency.  Chest x-ray negative.  Blood pressures were fine with standing.  As we discussed you may want to talk to your primary care doctor about an outpatient MRI for the persistent intermittent dizziness.  Head CT done in November was negative but MRI would be more complete test to rule out any kind of brain tumor or any kind of stroke.  Do realize that your dizziness is somewhat chronic.  Return for any new or worse symptoms.

## 2019-02-21 NOTE — ED Provider Notes (Signed)
[facility] Provider Note   CSN: [id number] Arrival date & time: 02/21/19  1340     History Chief Complaint  Patient presents with  . Near Syncope  . Dizziness    [facility]. is a 85 y.o. male.  Patient brought in by [facility].  Patient was at home in the bathroom he got dizzy and felt as if his got a pass out.  There was associated mild headache.  No headache currently no dizziness currently.  No chest pain or shortness of breath.  Patient was admitted for very similar presentation at the beginning of November November 2 he was in the hospital to the fourth for orthostatic hypotension and the dizziness.  Had a head CT at that time which was negative.  Rest of his work-up without any acute findings.  Patient has not had an MRI.  Patient does have a history of prostate cancer.  With metastatic disease to the bones in his back.  Patient currently asymptomatic here.        Past Medical History:  Diagnosis Date  . Arthritis   . Atrial fibrillation ([place])   . Bone cancer ([place])   . Cardiomyopathy ([place])   . Chronic kidney disease   . Hyperlipidemia   . Hypertension   . Prostate cancer ([place]) 1997  . Unstable gait 04/16/2018   unstable gait in home    Patient Active Problem List   Diagnosis Date Noted  . Chronic diastolic CHF (congestive heart failure) ([facility]) 01/10/2019  . Bradycardia by electrocardiogram 05/13/2018  . Orthostatic hypotension 05/13/2018  . Renal insufficiency 04/05/2018  . Cancer-related pain 04/05/2018  . GERD (gastroesophageal reflux disease) 04/05/2018  . Neurogenic pain 04/05/2018  . 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 ([place]) 04/05/2018  . History of prostate cancer 04/05/2018  . Osteopenia of spine 04/05/2018  . Osteopenia determined by x-ray 04/05/2018  . Osteoarthritis of facet joint of lumbar spine 04/05/2018  . Osteoarthritis involving multiple joints 04/05/2018  . DDD (degenerative disc  disease), lumbar 04/05/2018  . Osteoarthritis of hip (Bilateral) 04/05/2018  . Abnormal MRI, lumbar spine 04/05/2018  . Lumbar central spinal stenosis, w/o neurogenic claudication 04/05/2018  . Lumbar foraminal stenosis (Bilateral: L4-5) (Right: L1-2, L5-S1) (Left: L2-3, L3-4) 04/05/2018  . Lumbar facet hypertrophy 04/05/2018  . Lumbar facet joint syndrome (Bilateral) 04/05/2018  . Metastatic cancer to spine ([place]) 04/05/2018  . History of kyphoplasty (L2) 04/05/2018  . Low vitamin B12 level 03/30/2018  . Chronic pain syndrome 03/23/2018  . Long term current use of opiate analgesic 03/23/2018  . Long term prescription benzodiazepine use 03/23/2018  . Pharmacologic therapy 03/23/2018  . Disorder of skeletal system 03/23/2018  . Problems influencing health status 03/23/2018  . Chronic low back pain (Primary Area of Pain) (Bilateral) (R>L) w/o sciatica 03/23/2018  . Compression fracture of L2 lumbar vertebra, sequela 01/13/2018  . Scrotal itching 01/13/2018  . Anxiety 01/13/2018  . Prostate cancer ([facility]) 01/12/2018  . Hypocalcemia 01/12/2018  . Overweight (BMI 25.0-29.9) 10/16/2017  . Nonischemic cardiomyopathy ([facility]) 03/27/2017  . Atrial fibrillation ([place]) 03/27/2017  . CKD (chronic kidney disease) stage 3, GFR 30-59 ml/min 10/16/2016  . 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 HTN (hypertension) 03/01/2015  . Mixed hyperlipidemia 03/01/2015  . Prediabetes 03/01/2015  . Vitamin D deficiency 03/01/2015  . Medication management 03/01/2015    Past Surgical History:  Procedure Laterality Date  . CATARACT EXTRACTION    . EYE SURGERY Bilateral  IOL/CE on Lt in [facility] in 2011.  [name] Kitchen PROSTATE SURGERY    . WRIST FOREIGN BODY REMOVAL     1957 glass       No family history on file.  Social History   Tobacco Use  . Smoking status: Former Smoker    Quit date: 03/14/1975    Years since quitting: 43.9  . Smokeless tobacco: Never Used  Substance Use Topics  . Alcohol  use: No    Alcohol/week: 0.0 standard drinks    Comment: occ  . Drug use: No    Home Medications Prior to Admission medications   Medication Sig Start Date End Date Taking? Authorizing Provider  ALPRAZolam Duanne Moron) 1 MG tablet Take 0.5-1 mg by mouth See admin instructions. Take 0.5mg  tablet by mouth in the morning and 1 tab by mouth in the evening. 01/02/19   [provider]  aspirin EC 81 MG tablet Take 81 mg by mouth daily.    [provider]  calcium carbonate (OSCAL) 1500 (600 Ca) MG TABS tablet Take 600 mg of elemental calcium by mouth 2 (two) times daily with a meal.    [provider]  gabapentin (NEURONTIN) 600 MG tablet Take 1/2 to 1 tablet 2 to 3 x /day for chronic pain Patient taking differently: Take 600 mg by mouth 3 (three) times daily.  12/20/18   Liane Comber, NP  labetalol (NORMODYNE) 100 MG tablet Take 50 mg by mouth 2 (two) times daily.    [provider]  Multiple Vitamins-Minerals (CENTRUM SILVER ADULT 50+ PO) Take by mouth.    [provider]  omeprazole (PRILOSEC) 20 MG capsule Take 1 capsule Daily for Heartburn & Indigestion Patient taking differently: Take 20 mg by mouth daily.  12/20/18   Liane Comber, NP  oxyCODONE (OXY IR/ROXICODONE) 5 MG immediate release tablet Take 1 tablet (5 mg total) by mouth every 6 (six) hours as needed for up to 30 days for severe pain. Must last 30 days. Patient not taking: Reported on 01/18/2019 05/13/18 01/10/19  [name], MD    Allergies    Ace inhibitors and Hydrocodone  Review of Systems   Review of Systems  Constitutional: Negative for chills and fever.  HENT: Negative for congestion, rhinorrhea and sore throat.   Eyes: Negative for visual disturbance.  Respiratory: Negative for cough and shortness of breath.   Cardiovascular: Negative for chest pain and leg swelling.  Gastrointestinal: Negative for abdominal pain, diarrhea, nausea and vomiting.  Genitourinary:  Negative for dysuria.  Musculoskeletal: Positive for back pain. Negative for neck pain.  Skin: Negative for rash.  Neurological: Positive for dizziness and headaches. Negative for weakness and light-headedness.  Hematological: Does not bruise/bleed easily.  Psychiatric/Behavioral: Negative for confusion.    Physical Exam Updated Vital Signs BP (!) 148/68   Pulse (!) 56   Temp 97.7 F (36.5 C) (Oral)   Resp 16   Ht 1.702 m (5\' 7" )   Wt 84.4 kg   SpO2 98%   BMI 29.13 kg/m   Physical Exam Vitals and nursing note reviewed.  Constitutional:      Appearance: Normal appearance. He is well-developed.  HENT:     Head: Normocephalic and atraumatic.  Eyes:     Extraocular Movements: Extraocular movements intact.     Conjunctiva/sclera: Conjunctivae normal.     Pupils: Pupils are equal, round, and reactive to light.  Cardiovascular:     Rate and Rhythm: Normal rate and regular rhythm.  Heart sounds: No murmur.  Pulmonary:     Effort: Pulmonary effort is normal. No respiratory distress.     Breath sounds: Normal breath sounds.  Abdominal:     Palpations: Abdomen is soft.     Tenderness: There is no abdominal tenderness.  Musculoskeletal:        General: No swelling. Normal range of motion.     Cervical back: Normal range of motion and neck supple.  Skin:    General: Skin is warm and dry.     Capillary Refill: Capillary refill takes less than 2 seconds.  Neurological:     General: No focal deficit present.     Mental Status: He is alert and oriented to person, place, and time.     Cranial Nerves: No cranial nerve deficit.     Sensory: No sensory deficit.     Motor: No weakness.     Coordination: Coordination normal.     Gait: Gait normal.     ED Results / Procedures / Treatments   Labs (all labs ordered are listed, but only abnormal results are displayed) Labs Reviewed  BASIC METABOLIC PANEL - Abnormal; Notable for the following components:      Result Value   CO2  20 (*)    Glucose, Bld 137 (*)    BUN 24 (*)    Creatinine, Ser 1.90 (*)    GFR calc non Af Amer 31 (*)    GFR calc Af Amer 36 (*)    All other components within normal limits  CBC - Abnormal; Notable for the following components:   RBC 3.64 (*)    Hemoglobin 12.5 (*)    HCT 38.5 (*)    MCV 105.8 (*)    MCH 34.3 (*)    All other components within normal limits  URINALYSIS, ROUTINE W REFLEX MICROSCOPIC - Abnormal; Notable for the following components:   Color, Urine STRAW (*)    All other components within normal limits  CBG MONITORING, ED - Abnormal; Notable for the following components:   Glucose-Capillary 144 (*)    All other components within normal limits  HEPATIC FUNCTION PANEL    EKG EKG Interpretation  Date/Time:  Monday February 21 2019 13:41:47 EST Ventricular Rate:  63 PR Interval:  158 QRS Duration: 82 QT Interval:  420 QTC Calculation: 429 R Axis:   1 Text Interpretation: Sinus rhythm with Premature atrial complexes Otherwise normal ECG Confirmed by Fredia Sorrow [id number]) on 02/21/2019 6:06:46 PM   Radiology DG Chest Port 1 View  Result Date: 02/21/2019 CLINICAL DATA:  Near syncope.  Dizziness, headache. EXAM: PORTABLE CHEST 1 VIEW COMPARISON:  01/10/2019 FINDINGS: Heart is upper limits normal in size. No confluent opacities or effusions. No acute bony abnormality. IMPRESSION: No active disease. Electronically Signed   By: Rolm Baptise [name]   On: 02/21/2019 18:51    Procedures Procedures (including critical care time)  Medications Ordered in ED Medications - No data to display  ED Course  I have reviewed the triage vital signs and the nursing notes.  Pertinent labs & imaging results that were available during my care of the patient were reviewed by me and considered in my medical decision making (see chart for details).    MDM Rules/Calculators/A&P                     Orthostatics here without significant change.  More importantly patient had no  dizziness and felt fine standing.  Cardiac monitoring without  any arrhythmia.  Labs without any significant abnormality.  Patient has existing renal insufficiency slightly worse than baseline.  Did recommend MRI brain patient did not want to do that.  Recommend he gets it done as an outpatient.  Patient's had the dizziness on and off now for several weeks maybe even months.  Did have a negative head CT beginning of November with his admission for hypotension and dizziness then.  Patient understands that we could have done the MRI based on the duration of the symptoms not critical to be done here today he wants to follow-up with his regular doctor and have that arranged as an outpatient.  Patient stable for discharge home.  Final Clinical Impression(s) / ED Diagnoses Final diagnoses:  Near syncope  Dizziness    Rx / DC Orders ED Discharge Orders    None       Fredia Sorrow, MD 02/21/19 2106

## 2019-02-24 ENCOUNTER — Encounter: Payer: Medicare Other | Admitting: Physician Assistant

## 2019-02-25 ENCOUNTER — Ambulatory Visit

## 2019-02-26 NOTE — Progress Notes (Signed)
This encounter was created in error - please disregard.

## 2019-03-03 ENCOUNTER — Other Ambulatory Visit: Payer: Self-pay

## 2019-03-03 ENCOUNTER — Telehealth: Payer: Self-pay

## 2019-03-03 ENCOUNTER — Inpatient Hospital Stay: Payer: Medicare Other | Attending: Oncology

## 2019-03-03 ENCOUNTER — Inpatient Hospital Stay: Payer: Medicare Other

## 2019-03-03 ENCOUNTER — Ambulatory Visit: Admitting: Urology

## 2019-03-03 VITALS — BP 150/64 | HR 59 | Temp 98.2°F | Resp 17

## 2019-03-03 DIAGNOSIS — C61 Malignant neoplasm of prostate: Secondary | ICD-10-CM

## 2019-03-03 DIAGNOSIS — C7951 Secondary malignant neoplasm of bone: Secondary | ICD-10-CM | POA: Insufficient documentation

## 2019-03-03 DIAGNOSIS — Z79899 Other long term (current) drug therapy: Secondary | ICD-10-CM | POA: Insufficient documentation

## 2019-03-03 DIAGNOSIS — S32020S Wedge compression fracture of second lumbar vertebra, sequela: Secondary | ICD-10-CM

## 2019-03-03 LAB — CBC WITH DIFFERENTIAL (CANCER CENTER ONLY)
Abs Immature Granulocytes: 0.01 10*3/uL (ref 0.00–0.07)
Basophils Absolute: 0 10*3/uL (ref 0.0–0.1)
Basophils Relative: 1 %
Eosinophils Absolute: 0.2 10*3/uL (ref 0.0–0.5)
Eosinophils Relative: 4 %
HCT: 36.1 % — ABNORMAL LOW (ref 39.0–52.0)
Hemoglobin: 12.1 g/dL — ABNORMAL LOW (ref 13.0–17.0)
Immature Granulocytes: 0 %
Lymphocytes Relative: 32 %
Lymphs Abs: 1.5 10*3/uL (ref 0.7–4.0)
MCH: 34.1 pg — ABNORMAL HIGH (ref 26.0–34.0)
MCHC: 33.5 g/dL (ref 30.0–36.0)
MCV: 101.7 fL — ABNORMAL HIGH (ref 80.0–100.0)
Monocytes Absolute: 0.5 10*3/uL (ref 0.1–1.0)
Monocytes Relative: 10 %
Neutro Abs: 2.6 10*3/uL (ref 1.7–7.7)
Neutrophils Relative %: 53 %
Platelet Count: 155 10*3/uL (ref 150–400)
RBC: 3.55 MIL/uL — ABNORMAL LOW (ref 4.22–5.81)
RDW: 13.5 % (ref 11.5–15.5)
WBC Count: 4.8 10*3/uL (ref 4.0–10.5)
nRBC: 0 % (ref 0.0–0.2)

## 2019-03-03 LAB — CMP (CANCER CENTER ONLY)
ALT: 22 U/L (ref 0–44)
AST: 23 U/L (ref 15–41)
Albumin: 3.9 g/dL (ref 3.5–5.0)
Alkaline Phosphatase: 53 U/L (ref 38–126)
Anion gap: 9 (ref 5–15)
BUN: 19 mg/dL (ref 8–23)
CO2: 20 mmol/L — ABNORMAL LOW (ref 22–32)
Calcium: 9.1 mg/dL (ref 8.9–10.3)
Chloride: 111 mmol/L (ref 98–111)
Creatinine: 1.6 mg/dL — ABNORMAL HIGH (ref 0.61–1.24)
GFR, Est AFR Am: 45 mL/min — ABNORMAL LOW (ref >60)
GFR, Estimated: 39 mL/min — ABNORMAL LOW (ref >60)
Glucose, Bld: 129 mg/dL — ABNORMAL HIGH (ref 70–99)
Potassium: 4.5 mmol/L (ref 3.5–5.1)
Sodium: 140 mmol/L (ref 135–145)
Total Bilirubin: 0.5 mg/dL (ref 0.3–1.2)
Total Protein: 6.9 g/dL (ref 6.5–8.1)

## 2019-03-03 MED ORDER — DENOSUMAB 120 MG/1.7ML ~~LOC~~ SOLN
SUBCUTANEOUS | Status: AC
  Filled 2019-03-03: qty 1.7

## 2019-03-03 MED ORDER — DENOSUMAB 120 MG/1.7ML ~~LOC~~ SOLN
120.0000 mg | Freq: Once | SUBCUTANEOUS | Status: AC
  Administered 2019-03-03: 14:00:00 120 mg via SUBCUTANEOUS

## 2019-03-03 NOTE — Telephone Encounter (Signed)
-----   Message from Wyatt Portela, MD sent at 03/03/2019  2:17 PM EST ----- The symptoms have nothing to do with his prostate cancer.  I would recommend follow-up with his primary care physician.  Thanks ----- Message ----- From: Tami Lin, RN Sent: 03/03/2019   2:09 PM EST To: Wyatt Portela, MD  Patient's friend Ginger is concerned about patient's syncopal episodes. Patient was in the ED on 02/21/19 and the physician offered to do a MRI but patient declined so per ED physician, patient was to follow up and have MRI scheduled outpatient. Due to history of prostate cancer Ginger is concerned about these symptoms. Patient's next appointment to see you is on 03/22/19. Ginger wants to know if patient should get MRI ordered by you or does he need to go through his PCP.  Lanelle Bal

## 2019-03-03 NOTE — Telephone Encounter (Signed)
Spoke to patient's friend Ginger and made her aware of Dr. Hazeline Junker recommendation. She verbalized understanding and stated she will have patient follow up with his PCP.

## 2019-03-04 LAB — PROSTATE-SPECIFIC AG, SERUM (LABCORP): Prostate Specific Ag, Serum: 4.9 ng/mL — ABNORMAL HIGH (ref 0.0–4.0)

## 2019-03-13 ENCOUNTER — Ambulatory Visit (INDEPENDENT_AMBULATORY_CARE_PROVIDER_SITE_OTHER): Payer: Medicare Other

## 2019-03-13 DIAGNOSIS — R42 Dizziness and giddiness: Secondary | ICD-10-CM

## 2019-03-13 DIAGNOSIS — I4891 Unspecified atrial fibrillation: Secondary | ICD-10-CM

## 2019-03-13 DIAGNOSIS — R0602 Shortness of breath: Secondary | ICD-10-CM

## 2019-03-14 ENCOUNTER — Other Ambulatory Visit: Payer: Self-pay

## 2019-03-14 ENCOUNTER — Other Ambulatory Visit: Payer: Medicare Other | Admitting: Hospice

## 2019-03-14 DIAGNOSIS — Z515 Encounter for palliative care: Secondary | ICD-10-CM

## 2019-03-14 DIAGNOSIS — C7951 Secondary malignant neoplasm of bone: Secondary | ICD-10-CM

## 2019-03-14 NOTE — Progress Notes (Signed)
[facility] Telephone: [phone]  Fax: [phone]  PATIENT NAME: Logan [facility]. DOB: [birth year] MRN: [id number]  PRIMARY CARE PROVIDER:   [name], MD  REFERRING PROVIDER:  Unk Brown, [address] [place] [address] [place],  [place] [address]  RESPONSIBLE PARTY:Self [id number] Extended Emergency Contact Information Primary Emergency Contact: Brown,Logan [place] Mobile Phone: [phone] Relation: Friend  TELEHEALTH VISIT STATEMENT Due to the COVID-19 crisis, this visit was done via telephone from my office. It was initiated and consented to by this patient and/or family.  RECOMMENDATIONS/PLAN: 1. Advance Care Planning/Goals of Care:Telehealth visit consisted of building trust and follow up for palliative care.Patient remains a DNR with goals of care including to maximize quality of life and symptom relief. 2. Symptom management:Palpitations currently monitored with a heart monitor; fu with Cardiologist 03/31/2019. No syncopal episode since 02/21/2019. Shortness of breath on exertion is ongoing but improved  since he was taken off Lasix and Losartan by his Cardiologist .Patient continues on Oxycodone for his low back pain. Logan said patient gets hot flashes which was from his Lupron and Denosumab, per Oncologist. He denied pain/discomfort during visit, no acute distress/concerns.  3. Will follow up with patient after his appointment with his Cardiologist 03/31/2019. I spent 30 minutes providing this consultation,  from 11.00am to 11.30am. More than 50% of the time in this consultation was spent coordinating communication.   HISTORY OF PRESENT ILLNESS:Logan Pilatois a 86 y.o.year oldmalewith multiple medical problems including arthritis, prostate cancer, bone pain, a fib. Palliative Care was asked to help address goals of care   CODE STATUS: DNR  PPS:  50% HOSPICE ELIGIBILITY/DIAGNOSIS: TBD  PAST MEDICAL HISTORY:  Past Medical History:  Diagnosis Date  . Arthritis   . Atrial fibrillation ([place])   . Bone cancer ([place])   . Cardiomyopathy ([place])   . Chronic kidney disease   . Hyperlipidemia   . Hypertension   . Prostate cancer ([place]) 1997  . Unstable gait 04/16/2018   unstable gait in home    SOCIAL HX:  Social History   Tobacco Use  . Smoking status: Former Smoker    Quit date: 03/14/1975    Years since quitting: 44.0  . Smokeless tobacco: Never Used  Substance Use Topics  . Alcohol use: No    Alcohol/week: 0.0 standard drinks    Comment: occ    ALLERGIES:  Allergies  Allergen Reactions  . Ace Inhibitors Swelling    angioedema  . Hydrocodone Nausea And Vomiting     PERTINENT MEDICATIONS:  Outpatient Encounter Medications as of 03/14/2019  Medication Sig  . ALPRAZolam (XANAX) 1 MG tablet Take 0.5-1 mg by mouth See admin instructions. Take 0.5mg  tablet by mouth in the morning and 1 tab by mouth in the evening.  [name] Kitchen aspirin EC 81 MG tablet Take 81 mg by mouth daily.  . calcium carbonate (OSCAL) 1500 (600 Ca) MG TABS tablet Take 600 mg of elemental calcium by mouth 2 (two) times daily with a meal.  . gabapentin (NEURONTIN) 600 MG tablet Take 1/2 to 1 tablet 2 to 3 x /day for chronic pain (Patient taking differently: Take 600 mg by mouth 3 (three) times daily. )  . labetalol (NORMODYNE) 100 MG tablet Take 50 mg by mouth 2 (two) times daily.  . Multiple Vitamins-Minerals (CENTRUM SILVER ADULT 50+ PO) Take by mouth.  [name] Kitchen omeprazole (PRILOSEC) 20 MG capsule Take 1 capsule Daily for Heartburn & Indigestion (Patient taking  differently: Take 20 mg by mouth daily. )  . oxyCODONE (OXY IR/ROXICODONE) 5 MG immediate release tablet Take 1 tablet (5 mg total) by mouth every 6 (six) hours as needed for up to 30 days for severe pain. Must last 30 days. (Patient not taking: Reported on 01/18/2019)   No facility-administered encounter medications on  file as of 03/14/2019.   [facility] Spray, NP

## 2019-03-21 ENCOUNTER — Other Ambulatory Visit: Payer: Self-pay | Admitting: Emergency Medicine

## 2019-03-21 DIAGNOSIS — C61 Malignant neoplasm of prostate: Secondary | ICD-10-CM

## 2019-03-22 ENCOUNTER — Inpatient Hospital Stay: Payer: Medicare Other

## 2019-03-22 ENCOUNTER — Inpatient Hospital Stay: Payer: Medicare Other | Attending: Oncology | Admitting: Oncology

## 2019-03-22 ENCOUNTER — Other Ambulatory Visit: Payer: Self-pay

## 2019-03-22 ENCOUNTER — Ambulatory Visit: Admitting: Urology

## 2019-03-22 VITALS — BP 138/81

## 2019-03-22 VITALS — BP 139/96 | HR 68 | Temp 97.8°F | Resp 20 | Wt 186.5 lb

## 2019-03-22 DIAGNOSIS — C61 Malignant neoplasm of prostate: Secondary | ICD-10-CM | POA: Diagnosis not present

## 2019-03-22 DIAGNOSIS — Z191 Hormone sensitive malignancy status: Secondary | ICD-10-CM | POA: Insufficient documentation

## 2019-03-22 DIAGNOSIS — Z7982 Long term (current) use of aspirin: Secondary | ICD-10-CM | POA: Insufficient documentation

## 2019-03-22 DIAGNOSIS — R232 Flushing: Secondary | ICD-10-CM | POA: Diagnosis not present

## 2019-03-22 DIAGNOSIS — M549 Dorsalgia, unspecified: Secondary | ICD-10-CM | POA: Diagnosis not present

## 2019-03-22 DIAGNOSIS — Z79899 Other long term (current) drug therapy: Secondary | ICD-10-CM | POA: Insufficient documentation

## 2019-03-22 DIAGNOSIS — S32020S Wedge compression fracture of second lumbar vertebra, sequela: Secondary | ICD-10-CM

## 2019-03-22 DIAGNOSIS — C7951 Secondary malignant neoplasm of bone: Secondary | ICD-10-CM | POA: Insufficient documentation

## 2019-03-22 LAB — CMP (CANCER CENTER ONLY)
ALT: 20 U/L (ref 0–44)
AST: 23 U/L (ref 15–41)
Albumin: 4.1 g/dL (ref 3.5–5.0)
Alkaline Phosphatase: 67 U/L (ref 38–126)
Anion gap: 11 (ref 5–15)
BUN: 24 mg/dL — ABNORMAL HIGH (ref 8–23)
CO2: 24 mmol/L (ref 22–32)
Calcium: 8.7 mg/dL — ABNORMAL LOW (ref 8.9–10.3)
Chloride: 106 mmol/L (ref 98–111)
Creatinine: 1.76 mg/dL — ABNORMAL HIGH (ref 0.61–1.24)
GFR, Est AFR Am: 40 mL/min — ABNORMAL LOW (ref >60)
GFR, Estimated: 34 mL/min — ABNORMAL LOW (ref >60)
Glucose, Bld: 114 mg/dL — ABNORMAL HIGH (ref 70–99)
Potassium: 4.4 mmol/L (ref 3.5–5.1)
Sodium: 141 mmol/L (ref 135–145)
Total Bilirubin: 0.5 mg/dL (ref 0.3–1.2)
Total Protein: 7.2 g/dL (ref 6.5–8.1)

## 2019-03-22 LAB — CBC WITH DIFFERENTIAL (CANCER CENTER ONLY)
Abs Immature Granulocytes: 0.01 10*3/uL (ref 0.00–0.07)
Basophils Absolute: 0 10*3/uL (ref 0.0–0.1)
Basophils Relative: 0 %
Eosinophils Absolute: 0.2 10*3/uL (ref 0.0–0.5)
Eosinophils Relative: 4 %
HCT: 38.1 % — ABNORMAL LOW (ref 39.0–52.0)
Hemoglobin: 12.9 g/dL — ABNORMAL LOW (ref 13.0–17.0)
Immature Granulocytes: 0 %
Lymphocytes Relative: 34 %
Lymphs Abs: 1.8 10*3/uL (ref 0.7–4.0)
MCH: 34.2 pg — ABNORMAL HIGH (ref 26.0–34.0)
MCHC: 33.9 g/dL (ref 30.0–36.0)
MCV: 101.1 fL — ABNORMAL HIGH (ref 80.0–100.0)
Monocytes Absolute: 0.5 10*3/uL (ref 0.1–1.0)
Monocytes Relative: 10 %
Neutro Abs: 2.7 10*3/uL (ref 1.7–7.7)
Neutrophils Relative %: 52 %
Platelet Count: 170 10*3/uL (ref 150–400)
RBC: 3.77 MIL/uL — ABNORMAL LOW (ref 4.22–5.81)
RDW: 13.3 % (ref 11.5–15.5)
WBC Count: 5.2 10*3/uL (ref 4.0–10.5)
nRBC: 0 % (ref 0.0–0.2)

## 2019-03-22 MED ORDER — LEUPROLIDE ACETATE (4 MONTH) 30 MG ~~LOC~~ KIT
30.0000 mg | PACK | Freq: Once | SUBCUTANEOUS | Status: AC
  Administered 2019-03-22: 30 mg via SUBCUTANEOUS
  Filled 2019-03-22: qty 30

## 2019-03-22 NOTE — Progress Notes (Signed)
Hematology and Oncology Follow Up Visit  Logan Brown [id number] [birth year] 86 y.o. 03/22/2019 1:27 PM [name], MDMcKeown, [name], MD   Principle Diagnosis: 86 year old man with castration-sensitive advanced prostate cancer with disease to the bone since 2019.    Prior Therapy: He is status post  radical prostatectomy followed by external beam radiation and remains disease free after diagnosis in 2000.  He presented with a fall and found to have PSA of 245 and multiple osseous lesions indicating metastatic disease to the bone in May 2019.  He was started on Lupron and Xgeva at that time.    Current therapy:   Lupron every 3 months started at [facility]rology.  He is currently receiving Eligard every 4 months at the cancer center.  Next injection on March 22, 2019.  Xgeva 120 mg every month started in January of 2020.  Interim History: Logan Brown returns today for a follow-up visit.  Since the last visit, he reports no major changes in his health.  He does report slight increase in his back pain but overall manageable without any recent changes.  His performance status and quality of life remains unchanged at this time.  His appetite is reasonable without any recent weight loss or appetite changes.  He denies any recent falls or syncope.          Medications: Without any changes on review. Current Outpatient Medications  Medication Sig Dispense Refill  . ALPRAZolam (XANAX) 1 MG tablet Take 0.5-1 mg by mouth See admin instructions. Take 0.5mg  tablet by mouth in the morning and 1 tab by mouth in the evening.    [name] Kitchen aspirin EC 81 MG tablet Take 81 mg by mouth daily.    . calcium carbonate (OSCAL) 1500 (600 Ca) MG TABS tablet Take 600 mg of elemental calcium by mouth 2 (two) times daily with a meal.    . gabapentin (NEURONTIN) 600 MG tablet Take 1/2 to 1 tablet 2 to 3 x /day for chronic pain (Patient taking differently: Take 600 mg by mouth 3 (three) times daily. )  270 tablet 1  . labetalol (NORMODYNE) 100 MG tablet Take 50 mg by mouth 2 (two) times daily.    . Multiple Vitamins-Minerals (CENTRUM SILVER ADULT 50+ PO) Take by mouth.    [name] Kitchen omeprazole (PRILOSEC) 20 MG capsule Take 1 capsule Daily for Heartburn & Indigestion (Patient taking differently: Take 20 mg by mouth daily. ) 90 capsule 1  . oxyCODONE (OXY IR/ROXICODONE) 5 MG immediate release tablet Take 1 tablet (5 mg total) by mouth every 6 (six) hours as needed for up to 30 days for severe pain. Must last 30 days. (Patient not taking: Reported on 01/18/2019) 120 tablet 0   No current facility-administered medications for this visit.     Allergies:  Allergies  Allergen Reactions  . Ace Inhibitors Swelling    angioedema  . Hydrocodone Nausea And Vomiting    Past Medical History, Surgical history, Social history, and Family History without any changes on review.   Physical Exam: Blood pressure (!) 139/96, pulse 68, temperature 97.8 F (36.6 C), temperature source Temporal, resp. rate 20, weight 186 lb 8 oz (84.6 kg), SpO2 98 %.    ECOG: 2    General appearance: Comfortable appearing without any discomfort Head: Normocephalic without any trauma Oropharynx: Mucous membranes are moist and pink without any thrush or ulcers. Eyes: Pupils are equal and round reactive to light. Lymph nodes: No cervical, supraclavicular, inguinal or axillary lymphadenopathy.  Heart:regular rate and rhythm.  S1 and S2 without leg edema. Lung: Clear without any rhonchi or wheezes.  No dullness to percussion. Abdomin: Soft, nontender, nondistended with good bowel sounds.  No hepatosplenomegaly. Musculoskeletal: No joint deformity or effusion.  Full range of motion noted. Neurological: No deficits noted on motor, sensory and deep tendon reflex exam. Skin: No petechial rash or dryness.  Appeared moist.        Lab Results: Lab Results  Component Value Date   WBC 5.2 03/22/2019   HGB 12.9 (L) 03/22/2019    HCT 38.1 (L) 03/22/2019   MCV 101.1 (H) 03/22/2019   PLT 170 03/22/2019     Chemistry      Component Value Date/Time   NA 140 03/03/2019 1255   NA 143 03/23/2018 1109   K 4.5 03/03/2019 1255   CL 111 03/03/2019 1255   CO2 20 (L) 03/03/2019 1255   BUN 19 03/03/2019 1255   BUN 24 03/23/2018 1109   CREATININE 1.60 (H) 03/03/2019 1255   CREATININE 1.63 (H) 01/18/2019 1449      Component Value Date/Time   CALCIUM 9.1 03/03/2019 1255   ALKPHOS 53 03/03/2019 1255   AST 23 03/03/2019 1255   ALT 22 03/03/2019 1255   BILITOT 0.5 03/03/2019 1255      Results for Logan Brown, Logan Brown (MRN [id number]) as of 03/22/2019 13:34  Ref. Range 11/17/2018 09:30 03/03/2019 12:55  Prostate Specific Ag, Serum Latest Ref Range: 0.0 - 4.0 ng/mL 0.4 4.9 (H)       Impression and Plan:   86 year old with the following:   1.    Advanced prostate cancer that is currently castration-sensitive with disease to the bone since 2019.  He continues to tolerate androgen deprivation therapy although his PSA is slowly rising.  He is a risk of developing castration resistant disease and at that point additional therapy may be needed.  This would include CT scan and bone scan staging first and subsequently consideration for Zytiga or Xtandi after that.  The plan is to repeat imaging studies before the next visit and consider this therapy at that point.   2.  Androgen deprivation: He will receive Eligard today and repeated in 4 months.  Complication associated with this therapy including weight gain, hot flashes among others.  3.  Bone directed therapy: He will continue to receive Xgeva on a monthly basis.  Complications such as osteonecrosis of the jaw and hypocalcemia were reviewed.  He is agreeable to continue.   4.  Prognosis and goals of care: His disease is incurable although aggressive measures are warranted given his reasonable performance status.  5.  Back pain: Managed well continues to follow  with pain clinic at this time..  6.  Follow-up:  Monthly for Xgeva and in 4 months for his next Eligard injection.  30  minutes was spent on this encounter.  Time was dedicated to reviewing his disease status, reviewing laboratory data and answering questions regarding future plan of care.     Zola Button, MD 1/12/20211:27 PM

## 2019-03-22 NOTE — Patient Instructions (Signed)
Leuprolide depot injection What is this medicine? LEUPROLIDE (loo PROE lide) is a man-made protein that acts like a natural hormone in the body. It decreases testosterone in men and decreases estrogen in women. In men, this medicine is used to treat advanced prostate cancer. In women, some forms of this medicine may be used to treat endometriosis, uterine fibroids, or other male hormone-related problems. This medicine may be used for other purposes; ask your health care provider or pharmacist if you have questions. COMMON BRAND NAME(S): Eligard, Fensolv, Lupron Depot, Lupron Depot-Ped, Viadur What should I tell my health care provider before I take this medicine? They need to know if you have any of these conditions:  diabetes  heart disease or previous heart attack  high blood pressure  high cholesterol  mental illness  osteoporosis  pain or difficulty passing urine  seizures  spinal cord metastasis  stroke  suicidal thoughts, plans, or attempt; a previous suicide attempt by you or a family member  tobacco smoker  unusual vaginal bleeding (women)  an unusual or allergic reaction to leuprolide, benzyl alcohol, other medicines, foods, dyes, or preservatives  pregnant or trying to get pregnant  breast-feeding How should I use this medicine? This medicine is for injection into a muscle or for injection under the skin. It is given by a health care professional in a hospital or clinic setting. The specific product will determine how it will be given to you. Make sure you understand which product you receive and how often you will receive it. Talk to your pediatrician regarding the use of this medicine in children. Special care may be needed. Overdosage: If you think you have taken too much of this medicine contact a poison control center or emergency room at once. NOTE: This medicine is only for you. Do not share this medicine with others. What if I miss a dose? It is  important not to miss a dose. Call your doctor or health care professional if you are unable to keep an appointment. Depot injections: Depot injections are given either once-monthly, every 12 weeks, every 16 weeks, or every 24 weeks depending on the product you are prescribed. The product you are prescribed will be based on if you are male or male, and your condition. Make sure you understand your product and dosing. What may interact with this medicine? Do not take this medicine with any of the following medications:  chasteberry This medicine may also interact with the following medications:  herbal or dietary supplements, like black cohosh or DHEA  male hormones, like estrogens or progestins and birth control pills, patches, rings, or injections  male hormones, like testosterone This list may not describe all possible interactions. Give your health care provider a list of all the medicines, herbs, non-prescription drugs, or dietary supplements you use. Also tell them if you smoke, drink alcohol, or use illegal drugs. Some items may interact with your medicine. What should I watch for while using this medicine? Visit your doctor or health care professional for regular checks on your progress. During the first weeks of treatment, your symptoms may get worse, but then will improve as you continue your treatment. You may get hot flashes, increased bone pain, increased difficulty passing urine, or an aggravation of nerve symptoms. Discuss these effects with your doctor or health care professional, some of them may improve with continued use of this medicine. Male patients may experience a menstrual cycle or spotting during the first months of therapy with this medicine.   If this continues, contact your doctor or health care professional. This medicine may increase blood sugar. Ask your healthcare provider if changes in diet or medicines are needed if you have diabetes. What side effects may I  notice from receiving this medicine? Side effects that you should report to your doctor or health care professional as soon as possible:  allergic reactions like skin rash, itching or hives, swelling of the face, lips, or tongue  breathing problems  chest pain  depression or memory disorders  pain in your legs or groin  pain at site where injected or implanted  seizures  severe headache  signs and symptoms of high blood sugar such as being more thirsty or hungry or having to urinate more than normal. You may also feel very tired or have blurry vision  swelling of the feet and legs  suicidal thoughts or other mood changes  visual changes  vomiting Side effects that usually do not require medical attention (report to your doctor or health care professional if they continue or are bothersome):  breast swelling or tenderness  decrease in sex drive or performance  diarrhea  hot flashes  loss of appetite  muscle, joint, or bone pains  nausea  redness or irritation at site where injected or implanted  skin problems or acne This list may not describe all possible side effects. Call your doctor for medical advice about side effects. You may report side effects to FDA at 1-800-FDA-1088. Where should I keep my medicine? This drug is given in a hospital or clinic and will not be stored at home. NOTE: This sheet is a summary. It may not cover all possible information. If you have questions about this medicine, talk to your doctor, pharmacist, or health care provider.  2020 Elsevier/Gold Standard (2017-12-24 09:27:03) Coronavirus (COVID-19) Are you at risk?  Are you at risk for the Coronavirus (COVID-19)?  To be considered HIGH RISK for Coronavirus (COVID-19), you have to meet the following criteria:  . Traveled to [place], [place], [place], [place] or [place]; or in the [place] to [place], [place], [place], or [place]; and have fever, cough, and shortness of  breath within the last 2 weeks of travel OR . Been in close contact with a person diagnosed with COVID-19 within the last 2 weeks and have fever, cough, and shortness of breath . IF YOU DO NOT MEET THESE CRITERIA, YOU ARE CONSIDERED LOW RISK FOR COVID-19.  What to do if you are HIGH RISK for COVID-19?  [name] Kitchen If you are having a medical emergency, call 911. . Seek medical care right away. Before you go to a doctor's office, urgent care or emergency department, call ahead and tell them about your recent travel, contact with someone diagnosed with COVID-19, and your symptoms. You should receive instructions from your physician's office regarding next steps of care.  . When you arrive at healthcare provider, tell the healthcare staff immediately you have returned from visiting [place], [place], [place], [place] or [place]; or traveled in the [place] to [place], [place], [place], or [place]; in the last two weeks or you have been in close contact with a person diagnosed with COVID-19 in the last 2 weeks.   . Tell the health care staff about your symptoms: fever, cough and shortness of breath. . After you have been seen by a medical provider, you will be either: o Tested for (COVID-19) and discharged home on quarantine except to seek medical care  if symptoms worsen, and asked to  - Stay home and avoid contact with others until you get your results (4-5 days)  - Avoid travel on public transportation if possible (such as bus, train, or airplane) or o Sent to the Emergency Department by EMS for evaluation, COVID-19 testing, and possible admission depending on your condition and test results.  What to do if you are LOW RISK for COVID-19?  Reduce your risk of any infection by using the same precautions used for avoiding the common cold or flu:  [name] Kitchen Wash your hands often with soap and warm water for at least 20 seconds.  If soap and water are not readily available, use an alcohol-based hand sanitizer  with at least 60% alcohol.  . If coughing or sneezing, cover your mouth and nose by coughing or sneezing into the elbow areas of your shirt or coat, into a tissue or into your sleeve (not your hands). . Avoid shaking hands with others and consider head nods or verbal greetings only. . Avoid touching your eyes, nose, or mouth with unwashed hands.  . Avoid close contact with people who are sick. . Avoid places or events with large numbers of people in one location, like concerts or sporting events. . Carefully consider travel plans you have or are making. . If you are planning any travel outside or inside the [place], visit the CDC's Travelers' Health webpage for the latest health notices. . If you have some symptoms but not all symptoms, continue to monitor at home and seek medical attention if your symptoms worsen. . If you are having a medical emergency, call 911.   [place] / e-Visit: [email or web address]         [facility]: [facility]: [id number]                   [facility]: [phone]

## 2019-03-23 ENCOUNTER — Telehealth: Payer: Self-pay | Admitting: Oncology

## 2019-03-23 LAB — PROSTATE-SPECIFIC AG, SERUM (LABCORP): Prostate Specific Ag, Serum: 7.1 ng/mL — ABNORMAL HIGH (ref 0.0–4.0)

## 2019-03-23 NOTE — Telephone Encounter (Signed)
Scheduled appt per 1/12 los.  Sent a message to HIM pool to get a calendar mailed out. 

## 2019-04-04 NOTE — Progress Notes (Signed)
Cardiology Office Note   Date:  04/05/2019   ID:  Logan Miyamoto., DOB [birth year], MRN [id number]  PCP:  [name], MD  Cardiologist:   Minus Breeding, MD   Chief Complaint  Patient presents with  . Dizziness     History of Present Illness: Logan Mesner. is a 86 y.o. male who is referred by [name], MD for follow up of cardiomyopathy.  He had a previous EF of 25%.  However, in 2017 it was checked and it was 50 - 55%.  I suspect he was in atrial fibrillation as he is on amiodarone now.  He did not have a catheterization.  He was on Xarelto until he had hematuria and this was stopped.  When I saw him in [name] 2020 he was on amiodarone and I continued this.  However, was off of this when I saw him later in the year and I was unclear what happened to this.   He had orthostasis when he was seen by Rosaria Ferries [facility] in October.  He was hospitalized with dizziness in Nov.   He was orthostatic and had acute on chronic renal insufficiency.  He was hydrated.   There was a mention of paroxysmal atrial fib.  However, I looked at the EKGs, progress notes and rhythm strips and could not find PAF.    He was sent home off of Lasix and losartan.  He was back in the ED since I last saw him.  This was in mid Dec.  I reviewed these records for this visit.    He had dizziness and had some increased creat but there were no other acute abnormalities.  He declined an MRI and was discharged from the ED.   He did have an event monitor with no significant arrhythmias.   Since I last saw him he has done okay.  He is limited by his back pain.  He walks a long distance he gets short of breath but he says otherwise he is not having any shortness of breath at rest and not having any significant dyspnea with mild exertion.  He has not having any PND or orthopnea.  He denies any palpitations, presyncope or syncope.  He has had no chest pressure, neck or arm discomfort.   Past Medical History:    Diagnosis Date  . Arthritis   . Atrial fibrillation ([place])   . Bone cancer ([place])   . Cardiomyopathy ([place])   . Chronic kidney disease   . Hyperlipidemia   . Hypertension   . Prostate cancer ([place]) 1997  . Unstable gait 04/16/2018   unstable gait in home    Past Surgical History:  Procedure Laterality Date  . CATARACT EXTRACTION    . EYE SURGERY Bilateral    IOL/CE on Lt in 1998 and Rt in 2011.  [name] Kitchen PROSTATE SURGERY    . WRIST FOREIGN BODY REMOVAL     1957 glass     Current Outpatient Medications  Medication Sig Dispense Refill  . ALPRAZolam (XANAX) 1 MG tablet Take 0.5-1 mg by mouth See admin instructions. Take 0.5mg  tablet by mouth in the morning and 1 tab by mouth in the evening.    [name] Kitchen aspirin EC 81 MG tablet Take 81 mg by mouth daily.    . calcium carbonate (OSCAL) 1500 (600 Ca) MG TABS tablet Take 600 mg of elemental calcium by mouth 2 (two) times daily with a meal.    . gabapentin (NEURONTIN) 600  MG tablet Take 1/2 to 1 tablet 2 to 3 x /day for chronic pain 270 tablet 1  . labetalol (NORMODYNE) 100 MG tablet Take 50 mg by mouth 2 (two) times daily.    . Multiple Vitamins-Minerals (CENTRUM SILVER ADULT 50+ PO) Take by mouth.    [name] Kitchen omeprazole (PRILOSEC) 20 MG capsule Take 1 capsule Daily for Heartburn & Indigestion 90 capsule 1  . oxyCODONE (OXY IR/ROXICODONE) 5 MG immediate release tablet Take 1 tablet (5 mg total) by mouth every 6 (six) hours as needed for up to 30 days for severe pain. Must last 30 days. 120 tablet 0   No current facility-administered medications for this visit.    Allergies:   Ace inhibitors and Hydrocodone     ROS:  Please see the history of present illness.   Otherwise, review of systems are positive for none.   All other systems are reviewed and negative.    PHYSICAL EXAM: VS:  BP 132/80   Pulse 70   Ht 5\' 7"  (1.702 m)   Wt 188 lb 12.8 oz (85.6 kg)   SpO2 98%   BMI 29.57 kg/m  , BMI Body mass index is 29.57 kg/m. GENERAL:  Well  appearing NECK:  No jugular venous distention, waveform within normal limits, carotid upstroke brisk and symmetric, no bruits, no thyromegaly LUNGS:  Clear to auscultation bilaterally CHEST:  Unremarkable HEART:  PMI not displaced or sustained,S1 and S2 within normal limits, no S3, no S4, no clicks, no rubs, no murmurs ABD:  Flat, positive bowel sounds normal in frequency in pitch, no bruits, no rebound, no guarding, no midline pulsatile mass, no hepatomegaly, no splenomegaly EXT:  2 plus pulses throughout, no edema, no cyanosis no clubbing    EKG:  EKG is  ordered today. Sinus rhythm, rate 70, axis within normal limits, intervals within normal limits, no acute ST-T wave changes.  Recent Labs: 01/10/2019: Magnesium 2.2; TSH 2.096 03/22/2019: ALT 20; BUN 24; Creatinine 1.76; Hemoglobin 12.9; Platelet Count 170; Potassium 4.4; Sodium 141     Lab Results  Component Value Date   TSH 2.096 01/10/2019   ALT 20 03/22/2019   AST 23 03/22/2019   ALKPHOS 67 03/22/2019   BILITOT 0.5 03/22/2019   PROT 7.2 03/22/2019   ALBUMIN 4.1 03/22/2019     Wt Readings from Last 3 Encounters:  04/05/19 188 lb 12.8 oz (85.6 kg)  03/22/19 186 lb 8 oz (84.6 kg)  02/21/19 186 lb (84.4 kg)      Other studies Reviewed: Additional studies/ records that were reviewed today include:   ED records.    Review of the above records demonstrates:  See elsewhere    ASSESSMENT AND PLAN:  CARDIOMYOPATHY: The patient seems to be euvolemic.  No change in therapy.  HTN: Blood pressure is at target.  No change in therapy.    CAROTID STENOSIS:  He had mild plaque in July 2018.   I will further schedule imaging in the future but not at this time.  We will wait till we are out of the pandemic.   ATRIAL FIB:    The patient's had no symptomatic or documented recurrence of atrial fibrillation that I can find.  He is off the amiodarone for unclear reasons.  I would restart it in the future if I do see atrial fibrillation.   He is unable to take anticoagulation.   CKD III:  The last creat was 1.9.  He needs follow up labs.  This was somewhere  near his baseline.  No change in therapy.   DYSPNEA: This is mild and improved.  No change in therapy.   DIZZINESS: I reviewed the emergency room notes.  He has had no recurrent dizziness.  No further work-up at this point.  COVID EDUCATION: We signed him up for the vaccine waiting list.   Current medicines are reviewed at length with the patient today.  The patient does not have concerns regarding medicines.  The following changes have been made:  None  Labs/ tests ordered today include:   Orders Placed This Encounter  Procedures  . EKG 12-Lead     Disposition:   FU with APP in six months.     Signed, Minus Breeding, MD  04/05/2019 1:56 PM    [facility]

## 2019-04-05 ENCOUNTER — Encounter: Payer: Self-pay | Admitting: Cardiology

## 2019-04-05 ENCOUNTER — Other Ambulatory Visit: Payer: Self-pay

## 2019-04-05 ENCOUNTER — Ambulatory Visit: Payer: Medicare Other | Admitting: Cardiology

## 2019-04-05 VITALS — BP 132/80 | HR 70 | Ht 67.0 in | Wt 188.8 lb

## 2019-04-05 DIAGNOSIS — N1831 Chronic kidney disease, stage 3a: Secondary | ICD-10-CM | POA: Diagnosis not present

## 2019-04-05 DIAGNOSIS — I420 Dilated cardiomyopathy: Secondary | ICD-10-CM | POA: Diagnosis not present

## 2019-04-05 DIAGNOSIS — R0602 Shortness of breath: Secondary | ICD-10-CM | POA: Diagnosis not present

## 2019-04-05 DIAGNOSIS — I10 Essential (primary) hypertension: Secondary | ICD-10-CM | POA: Diagnosis not present

## 2019-04-05 DIAGNOSIS — I480 Paroxysmal atrial fibrillation: Secondary | ICD-10-CM | POA: Diagnosis not present

## 2019-04-05 DIAGNOSIS — Z7189 Other specified counseling: Secondary | ICD-10-CM

## 2019-04-05 NOTE — Patient Instructions (Addendum)
Medication Instructions:  No changes *If you need a refill on your cardiac medications before your next appointment, please call your pharmacy*  Lab Work: None  Testing/Procedures: None  Follow-Up: At [facility], you and your health needs are our priority.  As part of our continuing mission to provide you with exceptional heart care, we have created designated Provider Care Teams.  These Care Teams include your primary Cardiologist (physician) and Advanced Practice Providers (APPs -  Physician Assistants and Nurse Practitioners) who all work together to provide you with the care you need, when you need it.  Your next appointment:   6 month(s)  The format for your next appointment:   In Person  Provider:   You may see one of the following Advanced Practice Providers on your designated Care Team:    Rosaria Ferries, PA-C  Jory Sims, DNP, ANP

## 2019-04-07 ENCOUNTER — Telehealth: Payer: Self-pay | Admitting: Hospice

## 2019-04-07 NOTE — Telephone Encounter (Signed)
Called patient as scheduled; not answered. Left him a voicemail with call back number

## 2019-04-13 ENCOUNTER — Other Ambulatory Visit: Payer: Self-pay | Admitting: *Deleted

## 2019-04-13 DIAGNOSIS — K219 Gastro-esophageal reflux disease without esophagitis: Secondary | ICD-10-CM

## 2019-04-13 MED ORDER — OMEPRAZOLE 20 MG PO CPDR: DELAYED_RELEASE_CAPSULE | ORAL | 1 refills | Status: DC

## 2019-04-18 ENCOUNTER — Other Ambulatory Visit: Payer: Self-pay | Admitting: Internal Medicine

## 2019-04-18 ENCOUNTER — Telehealth: Payer: Self-pay | Admitting: *Deleted

## 2019-04-18 MED ORDER — HYDROXYZINE HCL 25 MG PO TABS: ORAL_TABLET | ORAL | 0 refills | Status: DC

## 2019-04-18 NOTE — Telephone Encounter (Signed)
Patient was informed Dr Melford Aase cannot refil his Alprazolam, due to the fact he is taking opioids. Dr Melford Aase sent in an RX for Hydroxyzine 25 mg for the patiet's anxiety.  Patient is aware.

## 2019-04-19 ENCOUNTER — Other Ambulatory Visit: Payer: Self-pay

## 2019-04-19 ENCOUNTER — Inpatient Hospital Stay: Payer: Medicare Other | Attending: Oncology

## 2019-04-19 ENCOUNTER — Inpatient Hospital Stay: Payer: Medicare Other

## 2019-04-19 VITALS — BP 154/87 | HR 67 | Temp 98.0°F | Resp 18

## 2019-04-19 DIAGNOSIS — C61 Malignant neoplasm of prostate: Secondary | ICD-10-CM | POA: Insufficient documentation

## 2019-04-19 DIAGNOSIS — Z79899 Other long term (current) drug therapy: Secondary | ICD-10-CM | POA: Insufficient documentation

## 2019-04-19 DIAGNOSIS — S32020S Wedge compression fracture of second lumbar vertebra, sequela: Secondary | ICD-10-CM

## 2019-04-19 DIAGNOSIS — C7951 Secondary malignant neoplasm of bone: Secondary | ICD-10-CM | POA: Insufficient documentation

## 2019-04-19 LAB — BASIC METABOLIC PANEL
Anion gap: 8 (ref 5–15)
BUN: 20 mg/dL (ref 8–23)
CO2: 24 mmol/L (ref 22–32)
Calcium: 9.1 mg/dL (ref 8.9–10.3)
Chloride: 110 mmol/L (ref 98–111)
Creatinine, Ser: 1.45 mg/dL — ABNORMAL HIGH (ref 0.61–1.24)
GFR calc Af Amer: 51 mL/min — ABNORMAL LOW (ref >60)
GFR calc non Af Amer: 44 mL/min — ABNORMAL LOW (ref >60)
Glucose, Bld: 68 mg/dL — ABNORMAL LOW (ref 70–99)
Potassium: 4.4 mmol/L (ref 3.5–5.1)
Sodium: 142 mmol/L (ref 135–145)

## 2019-04-19 MED ORDER — DENOSUMAB 120 MG/1.7ML ~~LOC~~ SOLN
120.0000 mg | Freq: Once | SUBCUTANEOUS | Status: AC
  Administered 2019-04-19: 120 mg via SUBCUTANEOUS

## 2019-04-19 MED ORDER — DENOSUMAB 120 MG/1.7ML ~~LOC~~ SOLN
SUBCUTANEOUS | Status: AC
  Filled 2019-04-19: qty 1.7

## 2019-04-21 ENCOUNTER — Telehealth: Payer: Self-pay | Admitting: Pain Medicine

## 2019-04-21 ENCOUNTER — Telehealth: Payer: Self-pay | Admitting: Oncology

## 2019-04-21 ENCOUNTER — Encounter: Payer: Self-pay | Admitting: Pain Medicine

## 2019-04-21 NOTE — Telephone Encounter (Signed)
Left voicemail with patient re; appt on Monday 04/25/19, to please call us back to go over medications.

## 2019-04-21 NOTE — Telephone Encounter (Signed)
Patient is scheduled for Monday afternoon Virtual call w/ DR. Naveira/ he will need a nurse call to update he meds and chart. Thank you

## 2019-04-21 NOTE — Telephone Encounter (Signed)
Called pt per 2/9 sch message - no answer . Left message for patient to call back if he still needed help with scheduling

## 2019-04-24 NOTE — Progress Notes (Signed)
Patient: Logan [name].  Service Category: E/M  Provider: Gaspar Cola, MD  DOB: 1931-04-16  DOS: 04/25/2019  Location: Office  MRN: [id number]  Setting: Ambulatory outpatient  Referring Provider: [name], MD  Type: Established Patient  Specialty: Interventional Pain Management  PCP: [name], MD  Location: Remote location  Delivery: TeleHealth     Virtual Encounter - Pain Management PROVIDER NOTE: Information contained herein reflects review and annotations entered in association with encounter. Interpretation of such information and data should be left to medically-trained personnel. Information provided to patient can be located elsewhere in the medical record under "Patient Instructions". Document created using STT-dictation technology, any transcriptional errors that may result from process are unintentional.    Contact & Pharmacy Preferred: [phone] Home: [phone] (home) Mobile: There is no such number [name] file (mobile). E-mail: No e-mail address [name] record  [place] ([place]), [place] - [address] [place] ([place]) [place] [address] Phone: [phone] Fax: [phone]   Pre-screening  Logan [name] offered "in-person" vs "virtual" encounter. He indicated preferring virtual for this encounter.   Reason COVID-19*  Social distancing based [name] CDC and AMA recommendations.   I contacted Logan [name]. [name] 04/25/2019 via telephone.      I clearly identified myself as Gaspar Cola, MD. I verified that I was speaking with the correct person using two identifiers (Name: Logan [name]., and date of birth: 03-Sep-1931).  Consent I sought verbal advanced consent from Logan [name]. for virtual visit interactions. I informed Logan [name] of possible security and privacy concerns, risks, and limitations associated with providing "not-in-person" medical evaluation and management services. I also  informed Logan [name] of the availability of "in-person" appointments. Finally, I informed him that there would be a charge for the virtual visit and that he could be  personally, fully or partially, financially responsible for it. Logan [name] expressed understanding and agreed to proceed.   Historic Elements   Logan [name]. is a 86 y.o. year old, male patient evaluated today after his last contact with our practice [name] 04/21/2019. Logan [name]  has a past medical history of Arthritis, Atrial fibrillation ([place]), Bone cancer ([place]), Cardiomyopathy ([place]), Chronic kidney disease, Hyperlipidemia, Hypertension, Prostate cancer ([place]) (1997), and Unstable gait (04/16/2018). He also  has a past surgical history that includes Eye surgery (Bilateral); Cataract extraction; Prostate surgery; and Wrist foreign body removal. Logan [name] has a current medication list which includes the following prescription(s): aspirin ec, calcium carbonate, furosemide, gabapentin, hydroxyzine, labetalol, multiple vitamins-minerals, omeprazole, and oxycodone. He  reports that he quit smoking about 44 years ago. He has never used smokeless tobacco. He reports that he does not drink alcohol or use drugs. Logan [name] is allergic to ace inhibitors and hydrocodone.   HPI  Today, he is being contacted for a post-procedure assessment.  According to the patient the pain has now returned and is primarily in the lower back.  The patient indicated that he would like to have the injection done again.  We will make arrangements for that today.  Post-Procedure Evaluation  Procedure (05/13/2018): Diagnostic bilateral L2 transforaminal LESI #2 under fluoroscopic guidance and IV sedation Pre-procedure pain level:  3/10 Post-procedure: 0/10 (100% relief)  Sedation: Sedation provided.  Logan Fischer, RN  04/25/2019 10:12 AM  Sign when Signing Visit Pain relief after procedure (treated area only): (Questions asked to patient) 1. Starting about 15  minutes after  the procedure, and "while the area was still numb" (from the local anesthetics), were you having any of your usual pain "in that area" (the treated area)?  (NOTE: NOT including the discomfort from the needle sticks.) First 1 hour: 100 % better. First 4-6 hours: 100 % better. 2. How long did the numbness from the local anesthetics last? (More than 6 hours?) Duration: 7 to 8 hours.  3. How much better is your pain now, when compared to before the procedure? Current benefit: 0 % better. 4. Can you move better now? Improvement in ROM (Range of Motion): no 5. Can you do more now? Improvement in function: no 4. Did you have any problems with the procedure? Side-effects/Complications: No, his last procedure was 05/13/18. He stated that he had relief from the procedure for 3 months.  Current benefits: Defined as benefit that persist at this time.   Analgesia:  90-100% better for 3 months and then the pain started coming back. Function: Logan [name] reported improvement in function, unfortunately the problem has returned. ROM: Logan [name] reported improvement in ROM, unfortunately the problem has returned.  Pharmacotherapy Assessment  Analgesic: No opioid analgesics prescribed by our practice. Oxycodone IR  5 mg, 1 tab PO BID (15 MME/day) prescribed by [name], MD, in [place]. MME/day: 15 mg/day.   Monitoring: [place] PMP: PDMP reviewed during this encounter.       Pharmacotherapy: No side-effects or adverse reactions reported. Compliance: No problems identified. Effectiveness: Clinically acceptable. Plan: Refer to "POC".  UDS:  Summary  Date Value Ref Range Status  03/23/2018 FINAL  Final    Comment:    ==================================================================== TOXASSURE COMP DRUG ANALYSIS,UR ==================================================================== Test                             Result       Flag       Units Drug Present and Declared for Prescription  Verification   Fentanyl                       9            EXPECTED   ng/mg creat   Norfentanyl                    40           EXPECTED   ng/mg creat    Source of fentanyl is a scheduled prescription medication,    including IV, patch, and transmucosal formulations. Norfentanyl    is an expected metabolite of fentanyl.   Gabapentin                     PRESENT      EXPECTED Drug Present not Declared for Prescription Verification   Acetaminophen                  PRESENT      UNEXPECTED Drug Absent but Declared for Prescription Verification   Salicylate                     Not Detected UNEXPECTED    Aspirin, as indicated in the declared medication list, is not    always detected even when used as directed.   Hydroxyzine                    Not Detected UNEXPECTED ==================================================================== Test  Result    Flag   Units      Ref Range   Creatinine              235              mg/dL      >=20 ==================================================================== Declared Medications:  The flagging and interpretation [name] this report are based [name] the  following declared medications.  Unexpected results may arise from  inaccuracies in the declared medications.  **Note: The testing scope of this panel includes these medications:  Fentanyl (Fentanyl Patch)  Gabapentin  Hydroxyzine  **Note: The testing scope of this panel does not include small to  moderate amounts of these reported medications:  Aspirin (Aspirin 81)  **Note: The testing scope of this panel does not include following  reported medications:  Labetalol  Lisinopril  Multivitamin (MVI)  Omeprazole  Vitamin D3 ==================================================================== For clinical consultation, please call [phone]. ====================================================================    Laboratory Chemistry Profile   Renal Lab Results  Component  Value Date   BUN 20 04/19/2019   CREATININE 1.45 (H) 04/19/2019   BCR 18 01/18/2019   GFRAA 51 (L) 04/19/2019   GFRNONAA 44 (L) 04/19/2019    Hepatic Lab Results  Component Value Date   AST 23 03/22/2019   ALT 20 03/22/2019   ALBUMIN 4.1 03/22/2019   ALKPHOS 67 03/22/2019    Electrolytes Lab Results  Component Value Date   NA 142 04/19/2019   K 4.4 04/19/2019   CL 110 04/19/2019   CALCIUM 9.1 04/19/2019   MG 2.2 01/10/2019   PHOS 4.6 01/10/2019    Bone Lab Results  Component Value Date   VD25OH 39 06/01/2017   25OHVITD1 41 03/23/2018   25OHVITD2 <1.0 03/23/2018   25OHVITD3 41 03/23/2018   TESTOSTERONE <3 (L) 11/17/2018    Coagulation Lab Results  Component Value Date   INR 1.0 01/10/2019   LABPROT 13.4 01/10/2019   APTT 29 01/10/2019   PLT 170 03/22/2019    Cardiovascular Lab Results  Component Value Date   BNP 139.3 (H) 03/27/2017   HGB 12.9 (L) 03/22/2019   HCT 38.1 (L) 03/22/2019    Inflammation (CRP: Acute Phase) (ESR: Chronic Phase) Lab Results  Component Value Date   CRP 6 03/23/2018   ESRSEDRATE 30 03/23/2018      Note: Above Lab results reviewed.  Imaging  Cardiac event monitor NSR, Rare atrial ectopy NSVT with a nine beat run.  No symptoms reported with this.     Assessment  The primary encounter diagnosis was Chronic low back pain (Primary Area of Pain) (Bilateral) (R>L) w/o sciatica. 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  Plan of Care  Problem-specific:  No problem-specific Assessment & Plan notes found for this encounter.  I am having Logan [name]. maintain his Multiple Vitamins-Minerals (CENTRUM SILVER ADULT 50+ PO), aspirin EC, oxyCODONE, gabapentin, calcium carbonate, labetalol,  omeprazole, hydrOXYzine, and furosemide.  Pharmacotherapy (Medications Ordered): No orders of the defined types were placed in this encounter.  Orders:  Orders Placed This Encounter  Procedures  . Lumbar Transforaminal Epidural    Standing Status:   Future    Standing Expiration Date:   05/23/2019    Scheduling Instructions:     Side: Bilateral     Level: L2  Sedation: With Sedation.     Timeframe: ASAP    Order Specific Question:   Where will this procedure be performed?    Answer:   [facility]   Follow-up plan:   Return for Procedure (w/ sedation): (B) L2 TFESI, (ASAP).     Interventional management options: Planned, scheduled, and/or pending:   Therapeutic/palliative bilateral L2 transforaminal ESI #3 under fluoroscopic guidance and IV sedation   Considering:   Diagnostic IV lidocaine infusion  Diagnostic bilateral lumbar facet NB Possible bilateral lumbar facetRFA   Palliative PRN treatment(s):   Palliative bilateral L2 TFESI #3    Recent Visits No visits were found meeting these conditions.  Showing recent visits within past 90 days and meeting all other requirements   Today's Visits Date Type Provider Dept  04/25/19 Telemedicine [name], MD [facility]  Showing today's visits and meeting all other requirements   Future Appointments No visits were found meeting these conditions.  Showing future appointments within next 90 days and meeting all other requirements   I discussed the assessment and treatment plan with the patient. The patient was provided an opportunity to ask questions and all were answered. The patient agreed with the plan and demonstrated an understanding of the instructions.  Patient advised to call back or seek an in-person evaluation if the symptoms or condition worsens.  Duration of encounter: 13 minutes.  Note by: Gaspar Cola, MD Date: 04/25/2019; Time: 11:05 AM

## 2019-04-25 ENCOUNTER — Ambulatory Visit: Payer: Medicare Other | Attending: Pain Medicine | Admitting: Pain Medicine

## 2019-04-25 ENCOUNTER — Other Ambulatory Visit: Payer: Self-pay

## 2019-04-25 VITALS — Ht 67.0 in | Wt 191.0 lb

## 2019-04-25 DIAGNOSIS — S32020S Wedge compression fracture of second lumbar vertebra, sequela: Secondary | ICD-10-CM

## 2019-04-25 DIAGNOSIS — M545 Low back pain, unspecified: Secondary | ICD-10-CM

## 2019-04-25 DIAGNOSIS — Z9889 Other specified postprocedural states: Secondary | ICD-10-CM | POA: Diagnosis not present

## 2019-04-25 DIAGNOSIS — M48061 Spinal stenosis, lumbar region without neurogenic claudication: Secondary | ICD-10-CM

## 2019-04-25 DIAGNOSIS — G8929 Other chronic pain: Secondary | ICD-10-CM | POA: Diagnosis not present

## 2019-04-25 NOTE — Progress Notes (Signed)
Pain relief after procedure (treated area only): (Questions asked to patient) 1. Starting about 15 minutes after the procedure, and "while the area was still numb" (from the local anesthetics), were you having any of your usual pain "in that area" (the treated area)?  (NOTE: NOT including the discomfort from the needle sticks.) First 1 hour: 100 % better. First 4-6 hours: 100 % better. 2. How long did the numbness from the local anesthetics last? (More than 6 hours?) Duration: 7 to 8 hours.  3. How much better is your pain now, when compared to before the procedure? Current benefit: 0 % better. 4. Can you move better now? Improvement in ROM (Range of Motion): no 5. Can you do more now? Improvement in function: no 4. Did you have any problems with the procedure? Side-effects/Complications: No, his last procedure was 05/13/18. He stated that he had relief from the procedure for 3 months.

## 2019-04-25 NOTE — Patient Instructions (Signed)

## 2019-04-27 ENCOUNTER — Ambulatory Visit (INDEPENDENT_AMBULATORY_CARE_PROVIDER_SITE_OTHER): Payer: Medicare Other | Admitting: Internal Medicine

## 2019-04-27 ENCOUNTER — Other Ambulatory Visit: Payer: Self-pay

## 2019-04-27 ENCOUNTER — Telehealth: Payer: Self-pay | Admitting: *Deleted

## 2019-04-27 VITALS — BP 140/72 | HR 64 | Temp 97.2°F | Resp 18 | Ht 67.0 in | Wt 191.0 lb

## 2019-04-27 DIAGNOSIS — R7309 Other abnormal glucose: Secondary | ICD-10-CM | POA: Diagnosis not present

## 2019-04-27 DIAGNOSIS — E559 Vitamin D deficiency, unspecified: Secondary | ICD-10-CM

## 2019-04-27 DIAGNOSIS — I10 Essential (primary) hypertension: Secondary | ICD-10-CM

## 2019-04-27 DIAGNOSIS — I480 Paroxysmal atrial fibrillation: Secondary | ICD-10-CM

## 2019-04-27 DIAGNOSIS — E782 Mixed hyperlipidemia: Secondary | ICD-10-CM

## 2019-04-27 NOTE — Patient Instructions (Signed)

## 2019-04-27 NOTE — Progress Notes (Signed)
History of Present Illness:      This very nice 86 y.o. 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       Patient has hx/o Prostate Ca predating from 2000 and did well until presenting with widely metastatic bone mets in  2019. Currently , patient receives Lupron injections every 3 month at[facility] and is followed by Dr Alen Blew receiving injections Eligard every 4 months & Xgeva inj  monthly.       Patient is treated for HTN (2005)  & BP has been controlled at home. Today's BP is at goal - 140/72.  Patient is followed by Dr Percival Spanish for pAfib & Cardiomyopathy. Recent OV found in NSR off Amiodarone. Dr Percival Spanish also noted off Anticoagulants due to Hematuria. Patient has had no complaints of any cardiac type chest pain, palpitations, dyspnea / orthopnea / PND, dizziness, claudication, or dependent edema.      Hyperlipidemia is not controlled with diet & meds. Patient denies myalgias or other med SE's. Last Lipids were not at goal:  Lab Results  Component Value Date   CHOL 203 (H) 06/01/2017   HDL 62 06/01/2017   LDLCALC 104 (H) 06/01/2017   TRIG 257 (H) 06/01/2017   CHOLHDL 3.3 06/01/2017    Also, the patient has history of PreDiabetes (A1c 6.3% / 2016)  and has had no symptoms of reactive hypoglycemia, diabetic polys, paresthesias or visual blurring.  Last A1c was not at goal:  Lab Results  Component Value Date   HGBA1C 5.9 (H) 06/01/2017           Further, the patient also has history of Vitamin D Deficiency and supplements vitamin D without any suspected side-effects. Last vitamin D was Low:  Lab Results  Component Value Date   VD25OH 39 06/01/2017    Current Outpatient Medications on File Prior to Visit  Medication Sig  . aspirin EC 81 MG tablet Take 81 mg by mouth daily.  . calcium carbonate (OSCAL) 1500 (600 Ca)  MG TABS tablet Take 600 mg of elemental calcium by mouth 2 (two) times daily with a meal.  . furosemide (LASIX) 40 MG tablet Take 40 mg by mouth every Monday, Wednesday, and Friday.  . gabapentin (NEURONTIN) 600 MG tablet Take 1/2 to 1 tablet 2 to 3 x /day for chronic pain  . hydrOXYzine (ATARAX/VISTARIL) 25 MG tablet Take 1 tablet 2 to 3 x /day if needed for Nerves or Anxiety  . labetalol (NORMODYNE) 100 MG tablet Take 50 mg by mouth 2 (two) times daily.  . Multiple Vitamins-Minerals (CENTRUM SILVER ADULT 50+ PO) Take by mouth.  [name] Kitchen omeprazole (PRILOSEC) 20 MG capsule Take 1 capsule Daily for Heartburn & Indigestion  . oxyCODONE (OXY IR/ROXICODONE) 5 MG immediate release tablet Take 1 tablet (5 mg total) by mouth every 6 (six) hours as needed for up to 30 days for severe pain. Must last 30 days.   No current facility-administered medications on file prior to visit.    Allergies  Allergen Reactions  . Ace Inhibitors Swelling    angioedema  . Hydrocodone Nausea And Vomiting    PMHx:   Past Medical History:  Diagnosis Date  . Arthritis   . Atrial fibrillation ([place])   . Bone cancer ([place])   . Cardiomyopathy ([place])   . Chronic kidney disease   .  Hyperlipidemia   . Hypertension   . Prostate cancer ([place]) 1997  . Unstable gait 04/16/2018   unstable gait in home    Immunization History  Administered Date(s) Administered  . Influenza, High Dose Seasonal PF 02/19/2015, 01/08/2017, 03/25/2018, 11/18/2018    Past Surgical History:  Procedure Laterality Date  . CATARACT EXTRACTION    . EYE SURGERY Bilateral    IOL/CE on Lt in 1998 and Rt in 2011.  [name] Kitchen PROSTATE SURGERY    . WRIST FOREIGN BODY REMOVAL     1957 glass    FHx:    Reviewed / unchanged  SHx:    Reviewed / unchanged   Systems Review:  Constitutional: Denies fever, chills, wt changes, headaches, insomnia, fatigue, night sweats, change in appetite. Eyes: Denies redness, blurred vision, diplopia, discharge, itchy, watery  eyes.  ENT: Denies discharge, congestion, post nasal drip, epistaxis, sore throat, earache, hearing loss, dental pain, tinnitus, vertigo, sinus pain, snoring.  CV: Denies chest pain, palpitations, irregular heartbeat, syncope, dyspnea, diaphoresis, orthopnea, PND, claudication or edema. Respiratory: denies cough, dyspnea, DOE, pleurisy, hoarseness, laryngitis, wheezing.  Gastrointestinal: Denies dysphagia, odynophagia, heartburn, reflux, water brash, abdominal pain or cramps, nausea, vomiting, bloating, diarrhea, constipation, hematemesis, melena, hematochezia  or hemorrhoids. Genitourinary: Denies dysuria, frequency, urgency, nocturia, hesitancy, discharge, hematuria or flank pain. Musculoskeletal: Denies arthralgias, myalgias, stiffness, jt. swelling, pain, limping or strain/sprain.  Skin: Denies pruritus, rash, hives, warts, acne, eczema or change in skin lesion(s). Neuro: No weakness, tremor, incoordination, spasms, paresthesia or pain. Psychiatric: Denies confusion, memory loss or sensory loss. Endo: Denies change in weight, skin or hair change.  Heme/Lymph: No excessive bleeding, bruising or enlarged lymph nodes.  Physical Exam  BP 140/72   Pulse 64   Temp (!) 97.2 F (36.2 C)   Resp 18   Ht 5\' 7"  (1.702 m)   Wt 191 lb (86.6 kg)   BMI 29.91 kg/m   Appears  well nourished, well groomed  and in no distress.  Eyes: PERRLA, EOMs, conjunctiva no swelling or erythema. Sinuses: No frontal/maxillary tenderness ENT/Mouth: EAC's clear, TM's nl w/o erythema, bulging. Nares clear w/o erythema, swelling, exudates. Oropharynx clear without erythema or exudates. Oral hygiene is good. Tongue normal, non obstructing. Hearing intact.  Neck: Supple. Thyroid not palpable. Car 2+/2+ without bruits, nodes or JVD. Chest: Respirations nl with BS clear & equal w/o rales, rhonchi, wheezing or stridor.  Cor: Heart sounds normal w/ regular rate and rhythm without sig. murmurs, gallops, clicks or rubs.  Peripheral pulses normal and equal  without edema.  Abdomen: Soft & bowel sounds normal. Non-tender w/o guarding, rebound, hernias, masses or organomegaly.  Lymphatics: Unremarkable.  Musculoskeletal: Full ROM all peripheral extremities, joint stability, 5/5 strength and normal gait.  Skin: Warm, dry without exposed rashes, lesions or ecchymosis apparent.  Neuro: Cranial nerves intact, reflexes equal bilaterally. Sensory-motor testing grossly intact. Tendon reflexes grossly intact.  Pysch: Alert & oriented x 3.  Insight and judgement nl & appropriate. No ideations.  Assessment and Plan:  1. Essential hypertension  - Continue medication, monitor blood pressure at home.  - Continue DASH diet.  Reminder to go to the ER if any CP,  SOB, nausea, dizziness, severe HA, changes vision/speech.  - CBC with Differential/Platelet - COMPLETE METABOLIC PANEL WITH GFR - Magnesium - TSH  2. Hyperlipidemia, mixed  - Continue diet/meds, exercise,& lifestyle modifications.  - Continue monitor periodic cholesterol/liver & renal functions   - Lipid panel - TSH  3. Abnormal glucose  - Continue diet,  exercise  - Lifestyle modifications.  - Monitor appropriate labs.  - Insulin, random - Hemoglobin A1c  4. Vitamin D deficiency  - Continue supplementation.  - VITAMIN D 25 Hydroxy  5. Paroxysmal atrial fibrillation (HCC)  - CBC with Differential/Platelet - COMPLETE METABOLIC PANEL WITH GFR - Magnesium - Lipid panel - TSH - Hemoglobin A1c - Insulin, random - VITAMIN D 25 Hydroxy        Discussed  regular exercise, BP monitoring, weight control to achieve/maintain BMI less than 25 and discussed med and SE's. Recommended labs to assess and monitor clinical status with further disposition pending results of labs.  I discussed the assessment and treatment plan with the patient. The patient was provided an opportunity to ask questions and all were answered. The patient agreed with the plan and  demonstrated an understanding of the instructions.  I provided over 30 minutes of exam, counseling, chart review and  complex critical decision making.   Kirtland Bouchard, MD

## 2019-04-28 ENCOUNTER — Encounter: Payer: Self-pay | Admitting: Internal Medicine

## 2019-04-28 ENCOUNTER — Ambulatory Visit: Payer: Medicare Other | Admitting: Pain Medicine

## 2019-04-28 LAB — CBC WITH DIFFERENTIAL/PLATELET
Absolute Monocytes: 515 cells/uL (ref 200–950)
Basophils Absolute: 21 cells/uL (ref 0–200)
Basophils Relative: 0.4 %
Eosinophils Absolute: 239 cells/uL (ref 15–500)
Eosinophils Relative: 4.6 %
HCT: 36.2 % — ABNORMAL LOW (ref 38.5–50.0)
Hemoglobin: 12.4 g/dL — ABNORMAL LOW (ref 13.2–17.1)
Lymphs Abs: 1659 cells/uL (ref 850–3900)
MCH: 33.8 pg — ABNORMAL HIGH (ref 27.0–33.0)
MCHC: 34.3 g/dL (ref 32.0–36.0)
MCV: 98.6 fL (ref 80.0–100.0)
MPV: 10.5 fL (ref 7.5–12.5)
Monocytes Relative: 9.9 %
Neutro Abs: 2766 cells/uL (ref 1500–7800)
Neutrophils Relative %: 53.2 %
Platelets: 163 10*3/uL (ref 140–400)
RBC: 3.67 10*6/uL — ABNORMAL LOW (ref 4.20–5.80)
RDW: 13 % (ref 11.0–15.0)
Total Lymphocyte: 31.9 %
WBC: 5.2 10*3/uL (ref 3.8–10.8)

## 2019-04-28 LAB — COMPLETE METABOLIC PANEL WITH GFR
AG Ratio: 2 (calc) (ref 1.0–2.5)
ALT: 17 U/L (ref 9–46)
AST: 19 U/L (ref 10–35)
Albumin: 4.2 g/dL (ref 3.6–5.1)
Alkaline phosphatase (APISO): 67 U/L (ref 35–144)
BUN/Creatinine Ratio: 15 (calc) (ref 6–22)
BUN: 23 mg/dL (ref 7–25)
CO2: 26 mmol/L (ref 20–32)
Calcium: 9.4 mg/dL (ref 8.6–10.3)
Chloride: 109 mmol/L (ref 98–110)
Creat: 1.58 mg/dL — ABNORMAL HIGH (ref 0.70–1.11)
GFR, Est African American: 46 mL/min/{1.73_m2} — ABNORMAL LOW (ref >=60)
GFR, Est Non African American: 39 mL/min/{1.73_m2} — ABNORMAL LOW (ref >=60)
Globulin: 2.1 g/dL (calc) (ref 1.9–3.7)
Glucose, Bld: 83 mg/dL (ref 65–99)
Potassium: 4.7 mmol/L (ref 3.5–5.3)
Sodium: 142 mmol/L (ref 135–146)
Total Bilirubin: 0.5 mg/dL (ref 0.2–1.2)
Total Protein: 6.3 g/dL (ref 6.1–8.1)

## 2019-04-28 LAB — VITAMIN D 25 HYDROXY (VIT D DEFICIENCY, FRACTURES): Vit D, 25-Hydroxy: 25 ng/mL — ABNORMAL LOW (ref 30–100)

## 2019-04-28 LAB — LIPID PANEL
Cholesterol: 235 mg/dL — ABNORMAL HIGH (ref <200)
HDL: 43 mg/dL (ref >=40)
Non-HDL Cholesterol (Calc): 192 mg/dL (calc) — ABNORMAL HIGH (ref <130)
Total CHOL/HDL Ratio: 5.5 (calc) — ABNORMAL HIGH (ref <5.0)
Triglycerides: 487 mg/dL — ABNORMAL HIGH (ref <150)

## 2019-04-28 LAB — MAGNESIUM: Magnesium: 2.2 mg/dL (ref 1.5–2.5)

## 2019-04-28 LAB — HEMOGLOBIN A1C
Hgb A1c MFr Bld: 5.9 % of total Hgb — ABNORMAL HIGH (ref <5.7)
Mean Plasma Glucose: 123 (calc)
eAG (mmol/L): 6.8 (calc)

## 2019-04-28 LAB — INSULIN, RANDOM: Insulin: 27.3 u[IU]/mL — ABNORMAL HIGH

## 2019-04-28 LAB — TSH: TSH: 3.06 mIU/L (ref 0.40–4.50)

## 2019-05-05 ENCOUNTER — Ambulatory Visit
Admission: RE | Admit: 2019-05-05 | Discharge: 2019-05-05 | Disposition: A | Discharge status: Home / Self Care | Payer: Medicare Other | Source: Ambulatory Visit | Attending: Pain Medicine | Admitting: Pain Medicine

## 2019-05-05 ENCOUNTER — Ambulatory Visit (HOSPITAL_BASED_OUTPATIENT_CLINIC_OR_DEPARTMENT_OTHER): Payer: Medicare Other | Admitting: Pain Medicine

## 2019-05-05 ENCOUNTER — Other Ambulatory Visit: Payer: Self-pay

## 2019-05-05 ENCOUNTER — Encounter: Payer: Self-pay | Admitting: Pain Medicine

## 2019-05-05 VITALS — BP 156/75 | HR 65 | Temp 97.4°F | Resp 18 | Ht 67.0 in | Wt 191.0 lb

## 2019-05-05 DIAGNOSIS — S32020S Wedge compression fracture of second lumbar vertebra, sequela: Secondary | ICD-10-CM

## 2019-05-05 DIAGNOSIS — Z885 Allergy status to narcotic agent status: Secondary | ICD-10-CM | POA: Insufficient documentation

## 2019-05-05 DIAGNOSIS — Z888 Allergy status to other drugs, medicaments and biological substances status: Secondary | ICD-10-CM | POA: Diagnosis not present

## 2019-05-05 DIAGNOSIS — M5136 Other intervertebral disc degeneration, lumbar region: Secondary | ICD-10-CM

## 2019-05-05 DIAGNOSIS — G893 Neoplasm related pain (acute) (chronic): Secondary | ICD-10-CM | POA: Diagnosis not present

## 2019-05-05 DIAGNOSIS — X58XXXS Exposure to other specified factors, sequela: Secondary | ICD-10-CM | POA: Insufficient documentation

## 2019-05-05 DIAGNOSIS — M545 Low back pain, unspecified: Secondary | ICD-10-CM

## 2019-05-05 DIAGNOSIS — M48061 Spinal stenosis, lumbar region without neurogenic claudication: Secondary | ICD-10-CM | POA: Insufficient documentation

## 2019-05-05 DIAGNOSIS — G8929 Other chronic pain: Secondary | ICD-10-CM

## 2019-05-05 DIAGNOSIS — Z7982 Long term (current) use of aspirin: Secondary | ICD-10-CM | POA: Diagnosis not present

## 2019-05-05 DIAGNOSIS — Z79899 Other long term (current) drug therapy: Secondary | ICD-10-CM | POA: Insufficient documentation

## 2019-05-05 DIAGNOSIS — C7951 Secondary malignant neoplasm of bone: Secondary | ICD-10-CM | POA: Insufficient documentation

## 2019-05-05 LAB — GLUCOSE, CAPILLARY: Glucose-Capillary: 106 mg/dL — ABNORMAL HIGH (ref 70–99)

## 2019-05-05 MED ORDER — IOHEXOL 180 MG/ML  SOLN
10.0000 mL | Freq: Once | INTRAMUSCULAR | Status: AC
  Administered 2019-05-05: 10 mL via EPIDURAL
  Filled 2019-05-05: qty 20

## 2019-05-05 MED ORDER — DEXAMETHASONE SODIUM PHOSPHATE 10 MG/ML IJ SOLN
20.0000 mg | Freq: Once | INTRAMUSCULAR | Status: AC
  Administered 2019-05-05: 20 mg
  Filled 2019-05-05: qty 2

## 2019-05-05 MED ORDER — FENTANYL CITRATE (PF) 100 MCG/2ML IJ SOLN
25.0000 ug | INTRAMUSCULAR | Status: DC | PRN
  Administered 2019-05-05: 50 ug via INTRAVENOUS
  Filled 2019-05-05: qty 2

## 2019-05-05 MED ORDER — LACTATED RINGERS IV SOLN
1000.0000 mL | Freq: Once | INTRAVENOUS | Status: AC
  Administered 2019-05-05: 1000 mL via INTRAVENOUS

## 2019-05-05 MED ORDER — SODIUM CHLORIDE 0.9% FLUSH
2.0000 mL | Freq: Once | INTRAVENOUS | Status: AC
  Administered 2019-05-05: 2 mL

## 2019-05-05 MED ORDER — LIDOCAINE HCL 2 % IJ SOLN
20.0000 mL | Freq: Once | INTRAMUSCULAR | Status: AC
  Administered 2019-05-05: 400 mg
  Filled 2019-05-05: qty 40

## 2019-05-05 MED ORDER — MIDAZOLAM HCL 5 MG/5ML IJ SOLN
1.0000 mg | INTRAMUSCULAR | Status: DC | PRN
  Administered 2019-05-05: 2 mg via INTRAVENOUS
  Filled 2019-05-05: qty 5

## 2019-05-05 MED ORDER — ROPIVACAINE HCL 2 MG/ML IJ SOLN
2.0000 mL | Freq: Once | INTRAMUSCULAR | Status: AC
  Administered 2019-05-05: 2 mL via EPIDURAL
  Filled 2019-05-05: qty 10

## 2019-05-05 NOTE — Progress Notes (Signed)
PROVIDER NOTE: Information contained herein reflects review and annotations entered in association with encounter. Interpretation of such information and data should be left to medically-trained personnel. Information provided to patient can be located elsewhere in the medical record under "Patient Instructions". Document created using STT-dictation technology, any transcriptional errors that may result from process are unintentional.    Patient: Logan Brown.  Service Category: Procedure  Provider: Gaspar Cola, MD  DOB: [birth year]  DOS: 05/05/2019  Location: [facility]  MRN: [id number]  Setting: Ambulatory - outpatient  Referring Provider: [name], MD  Type: Established Patient  Specialty: Interventional Pain Management  PCP: [name], MD   Primary Reason for Visit: Interventional Pain Management Treatment. CC: Back Pain  Procedure:          Anesthesia, Analgesia, Anxiolysis:  Type: Trans-Foraminal Epidural Steroid Injection #3  Purpose: Therapeutic Region: Posterolateral Lumbosacral Target Area: The 6 o'clock position under the pedicle, on the affected side. Approach: Posterior Percutaneous Paravertebral approach. Level: L2 Level Laterality: Bilateral Paravertebral  Type: Moderate (Conscious) Sedation combined with Local Anesthesia Indication(s): Analgesia and Anxiety Route: Intravenous (IV) IV Access: Secured Sedation: Meaningful verbal contact was maintained at all times during the procedure  Local Anesthetic: Lidocaine 1-2%  Position: Prone   Indications: 1. Compression fracture of L2 lumbar vertebra, sequela   2. 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 DDD (degenerative disc disease), lumbar   4. Metastatic cancer to spine ([place])   5. Chronic low back pain (Primary Area of Pain) (Bilateral) (R>L) w/o sciatica   6. Lumbar central spinal stenosis, w/o neurogenic claudication   7.  Cancer-related pain    Pain Score: Pre-procedure: 8 /10 Post-procedure: 0-No pain/10   Pre-op Assessment:  Logan Brown is a 86 y.o. (year old), male patient, seen today for interventional treatment. He  has a past surgical history that includes Eye surgery (Bilateral); Cataract extraction; Prostate surgery; and Wrist foreign body removal. Logan Brown has a current medication list which includes the following prescription(s): aspirin ec, calcium carbonate, furosemide, gabapentin, hydroxyzine, labetalol, multiple vitamins-minerals, omeprazole, and oxycodone, and the following Facility-Administered Medications: fentanyl and midazolam. His primarily concern today is the Back Pain  Initial Vital Signs:  Pulse/HCG Rate: 63ECG Heart Rate: 62 Temp: (!) 97.5 F (36.4 C) Resp: 16 BP: (!) 182/94 SpO2: 98 %  BMI: Estimated body mass index is 29.91 kg/m as calculated from the following:   Height as of this encounter: 5\' 7"  (1.702 m).   Weight as of this encounter: 191 lb (86.6 kg).  Risk Assessment: Allergies: Reviewed. He is allergic to ace inhibitors and hydrocodone.  Allergy Precautions: None required Coagulopathies: Reviewed. None identified.  Blood-thinner therapy: None at this time Active Infection(s): Reviewed. None identified. Logan Brown is afebrile  Site Confirmation: Logan Brown was asked to confirm the procedure and laterality before marking the site Procedure checklist: Completed Consent: Before the procedure and under the influence of no sedative(s), amnesic(s), or anxiolytics, the patient was informed of the treatment options, risks and possible complications. To fulfill our ethical and legal obligations, as recommended by the American Medical Association's Code of Ethics, I have informed the patient of my clinical impression; the nature and purpose of the treatment or procedure; the risks, benefits, and possible complications of the intervention; the alternatives, including doing  nothing; the risk(s) and benefit(s) of the alternative treatment(s) or procedure(s); and the risk(s) and benefit(s) of doing nothing. The patient was provided information  about the general risks and possible complications associated with the procedure. These may include, but are not limited to: failure to achieve desired goals, infection, bleeding, organ or nerve damage, allergic reactions, paralysis, and death. In addition, the patient was informed of those risks and complications associated to Spine-related procedures, such as failure to decrease pain; infection (i.e.: Meningitis, epidural or intraspinal abscess); bleeding (i.e.: epidural hematoma, subarachnoid hemorrhage, or any other type of intraspinal or peri-dural bleeding); organ or nerve damage (i.e.: Any type of peripheral nerve, nerve root, or spinal cord injury) with subsequent damage to sensory, motor, and/or autonomic systems, resulting in permanent pain, numbness, and/or weakness of one or several areas of the body; allergic reactions; (i.e.: anaphylactic reaction); and/or death. Furthermore, the patient was informed of those risks and complications associated with the medications. These include, but are not limited to: allergic reactions (i.e.: anaphylactic or anaphylactoid reaction(s)); adrenal axis suppression; blood sugar elevation that in diabetics may result in ketoacidosis or comma; water retention that in patients with history of congestive heart failure may result in shortness of breath, pulmonary edema, and decompensation with resultant heart failure; weight gain; swelling or edema; medication-induced neural toxicity; particulate matter embolism and blood vessel occlusion with resultant organ, and/or nervous system infarction; and/or aseptic necrosis of one or more joints. Finally, the patient was informed that Medicine is not an exact science; therefore, there is also the possibility of unforeseen or unpredictable risks and/or possible  complications that may result in a catastrophic outcome. The patient indicated having understood very clearly. We have given the patient no guarantees and we have made no promises. Enough time was given to the patient to ask questions, all of which were answered to the patient's satisfaction. Logan Brown has indicated that he wanted to continue with the procedure. Attestation: I, the ordering provider, attest that I have discussed with the patient the benefits, risks, side-effects, alternatives, likelihood of achieving goals, and potential problems during recovery for the procedure that I have provided informed consent. Date  Time: 05/05/2019  9:36 AM  Pre-Procedure Preparation:  Monitoring: As per clinic protocol. Respiration, ETCO2, SpO2, BP, heart rate and rhythm monitor placed and checked for adequate function Safety Precautions: Patient was assessed for positional comfort and pressure points before starting the procedure. Time-out: I initiated and conducted the "Time-out" before starting the procedure, as per protocol. The patient was asked to participate by confirming the accuracy of the "Time Out" information. Verification of the correct person, site, and procedure were performed and confirmed by me, the nursing staff, and the patient. "Time-out" conducted as per Joint Commission's Universal Protocol (UP.01.01.01). Time: 1039  Description of Procedure:          Area Prepped: Entire Posterior Lumbosacral Area Prepping solution: DuraPrep (Iodine Povacrylex [0.7% available iodine] and Isopropyl Alcohol, 74% w/w) Safety Precautions: Aspiration looking for blood return was conducted prior to all injections. At no point did we inject any substances, as a needle was being advanced. No attempts were made at seeking any paresthesias. Safe injection practices and needle disposal techniques used. Medications properly checked for expiration dates. SDV (single dose vial) medications used. Description of the  Procedure: Protocol guidelines were followed. The patient was placed in position over the procedure table. The target area was identified and the area prepped in the usual manner. Skin & deeper tissues infiltrated with local anesthetic. Appropriate amount of time allowed to pass for local anesthetics to take effect. The procedure needles were then advanced to the target area.  Proper needle placement secured. Negative aspiration confirmed. Solution injected in intermittent fashion, asking for systemic symptoms every 0.5cc of injectate. The needles were then removed and the area cleansed, making sure to leave some of the prepping solution back to take advantage of its long term bactericidal properties.  Vitals:   05/05/19 1047 05/05/19 1057 05/05/19 1107 05/05/19 1118  BP: (!) 165/92 (!) 181/89 (!) 161/79 (!) 156/75  Pulse:      Resp: 18 18 18 18   Temp:  (!) 97.2 F (36.2 C)  (!) 97.4 F (36.3 C)  TempSrc:      SpO2: 96% 96% 97% 98%  Weight:      Height:        Start Time: 1039 hrs. End Time: 1047 hrs.  Materials:  Needle(s) Type: Spinal Needle Gauge: 22G Length: 3.5-in Medication(s): Please see orders for medications and dosing details.  Imaging Guidance (Spinal):          Type of Imaging Technique: Fluoroscopy Guidance (Spinal) Indication(s): Assistance in needle guidance and placement for procedures requiring needle placement in or near specific anatomical locations not easily accessible without such assistance. Exposure Time: Please see nurses notes. Contrast: Before injecting any contrast, we confirmed that the patient did not have an allergy to iodine, shellfish, or radiological contrast. Once satisfactory needle placement was completed at the desired level, radiological contrast was injected. Contrast injected under live fluoroscopy. No contrast complications. See chart for type and volume of contrast used. Fluoroscopic Guidance: I was personally present during the use of  fluoroscopy. "Tunnel Vision Technique" used to obtain the best possible view of the target area. Parallax error corrected before commencing the procedure. "Direction-depth-direction" technique used to introduce the needle under continuous pulsed fluoroscopy. Once target was reached, antero-posterior, oblique, and lateral fluoroscopic projection used confirm needle placement in all planes. Images permanently stored in EMR. Interpretation: I personally interpreted the imaging intraoperatively. Adequate needle placement confirmed in multiple planes. Appropriate spread of contrast into desired area was observed. No evidence of afferent or efferent intravascular uptake. No intrathecal or subarachnoid spread observed. Permanent images saved into the patient's record.  Antibiotic Prophylaxis:   Anti-infectives (From admission, onward)   None     Indication(s): None identified  Post-operative Assessment:  Post-procedure Vital Signs:  Pulse/HCG Rate: 6561 Temp: (!) 97.4 F (36.3 C) Resp: 18 BP: (!) 156/75 SpO2: 98 %  EBL: None  Complications: No immediate post-treatment complications observed by team, or reported by patient.  Note: The patient tolerated the entire procedure well. A repeat set of vitals were taken after the procedure and the patient was kept under observation following institutional policy, for this type of procedure. Post-procedural neurological assessment was performed, showing return to baseline, prior to discharge. The patient was provided with post-procedure discharge instructions, including a section on how to identify potential problems. Should any problems arise concerning this procedure, the patient was given instructions to immediately contact us, at any time, without hesitation. In any case, we plan to contact the patient by telephone for a follow-up status report regarding this interventional procedure.  Comments:  No additional relevant information.  Plan of Care   Orders:  Orders Placed This Encounter  Procedures  . Lumbar Transforaminal Epidural    Scheduling Instructions:     Side: Bilateral     Level: L2     Sedation: With Sedation.     Timeframe: Today    Order Specific Question:   Where will this procedure be performed?  Answer:   [facility]  . DG PAIN [facility] C-ARM 1-60 MIN NO REPORT    Intraoperative interpretation by procedural physician at [facility].    Standing Status:   Standing    Number of Occurrences:   1    Order Specific Question:   Reason for exam:    Answer:   Assistance in needle guidance and placement for procedures requiring needle placement in or near specific anatomical locations not easily accessible without such assistance.  . Glucose, capillary  . Informed Consent Details: Physician/Practitioner Attestation; Transcribe to consent form and obtain patient signature    Provider Attestation: I, [place] Dossie Arbour, MD, (Pain Management Specialist), the physician/practitioner, attest that I have discussed with the patient the benefits, risks, side effects, alternatives, likelihood of achieving goals and potential problems during recovery for the procedure that I have provided informed consent.    Scheduling Instructions:     Procedure: Diagnostic lumbar transforaminal epidural steroid injection under fluoroscopic guidance. (See notes for level and laterality.)     Indication/Reason: Lumbar radiculopathy/radiculitis associated with lumbar stenosis     Note: Always confirm laterality of pain with Logan Brown, before procedure.     Transcribe to consent form and obtain patient signature.  . Provide equipment / supplies at bedside    Equipment required: Single use, disposable, "Block Tray"    Standing Status:   Standing    Number of Occurrences:   1    Order Specific Question:   Specify    Answer:   Block Tray   Chronic Opioid Analgesic:  No opioid analgesics prescribed by our practice. Oxycodone IR  5  mg, 1 tab PO BID (15 MME/day) prescribed by [name], MD, in [place]. MME/day: 15 mg/day.   Medications ordered for procedure: Meds ordered this encounter  Medications  . iohexol (OMNIPAQUE) 180 MG/ML injection 10 mL    Must be Myelogram-compatible. If not available, you may substitute with a water-soluble, non-ionic, hypoallergenic, myelogram-compatible radiological contrast medium.  [name] Kitchen lidocaine (XYLOCAINE) 2 % (with pres) injection 400 mg  . lactated ringers infusion 1,000 mL  . midazolam (VERSED) 5 MG/5ML injection 1-2 mg    Make sure Flumazenil is available in the pyxis when using this medication. If oversedation occurs, administer 0.2 mg IV over 15 sec. If after 45 sec no response, administer 0.2 mg again over 1 min; may repeat at 1 min intervals; not to exceed 4 doses (1 mg)  . fentaNYL (SUBLIMAZE) injection 25-50 mcg    Make sure Narcan is available in the pyxis when using this medication. In the event of respiratory depression (RR< 8/min): Titrate NARCAN (naloxone) in increments of 0.1 to 0.2 mg IV at 2-3 minute intervals, until desired degree of reversal.  . sodium chloride flush (NS) 0.9 % injection 2 mL  . ropivacaine (PF) 2 mg/mL (0.2%) (NAROPIN) injection 2 mL  . dexamethasone (DECADRON) injection 20 mg   Medications administered: We administered iohexol, lidocaine, lactated ringers, midazolam, fentaNYL, sodium chloride flush, ropivacaine (PF) 2 mg/mL (0.2%), and dexamethasone.  See the medical record for exact dosing, route, and time of administration.  Follow-up plan:   Return in about 2 weeks (around 05/19/2019) for (VV), (PP).       Interventional management options: Planned, scheduled, and/or pending:   Therapeutic/palliative bilateral L2 transforaminal ESI #3 under fluoroscopic guidance and IV sedation   Considering:   Diagnostic IV lidocaine infusion  Diagnostic bilateral lumbar facet NB Possible bilateral lumbar facetRFA   Palliative PRN  treatment(s):    Palliative bilateral L2 TFESI #3    Recent Visits Date Type Provider Dept  04/25/19 Telemedicine [name], MD [facility]  Showing recent visits within past 90 days and meeting all other requirements   Today's Visits Date Type Provider Dept  05/05/19 Procedure visit [name], MD [facility]  Showing today's visits and meeting all other requirements   Future Appointments Date Type Provider Dept  05/18/19 Appointment [name], MD [facility]  Showing future appointments within next 90 days and meeting all other requirements   Disposition: Discharge home  Discharge (Date  Time): 05/05/2019; 1120 hrs.   Primary Care Physician: [name], MD Location: [facility] Note by: Gaspar Cola, MD Date: 05/05/2019; Time: 12:42 PM  Disclaimer:  Medicine is not an Chief Strategy Officer. The only guarantee in medicine is that nothing is guaranteed. It is important to note that the decision to proceed with this intervention was based on the information collected from the patient. The Data and conclusions were drawn from the patient's questionnaire, the interview, and the physical examination. Because the information was provided in large part by the patient, it cannot be guaranteed that it has not been purposely or unconsciously manipulated. Every effort has been made to obtain as much relevant data as possible for this evaluation. It is important to note that the conclusions that lead to this procedure are derived in large part from the available data. Always take into account that the treatment will also be dependent on availability of resources and existing treatment guidelines, considered by other Pain Management Practitioners as being common knowledge and practice, at the time of the intervention. For Medico-Legal purposes, it is also important to point out that variation in procedural techniques and  pharmacological choices are the acceptable norm. The indications, contraindications, technique, and results of the above procedure should only be interpreted and judged by a Board-Certified Interventional Pain Specialist with extensive familiarity and expertise in the same exact procedure and technique.

## 2019-05-05 NOTE — Progress Notes (Signed)
Safety precautions to be maintained throughout the outpatient stay will include: orient to surroundings, keep bed in low position, maintain call bell within reach at all times, provide assistance with transfer out of bed and ambulation.  

## 2019-05-05 NOTE — Patient Instructions (Signed)

## 2019-05-06 ENCOUNTER — Other Ambulatory Visit: Payer: Self-pay | Admitting: Internal Medicine

## 2019-05-06 ENCOUNTER — Telehealth: Payer: Self-pay

## 2019-05-06 DIAGNOSIS — I10 Essential (primary) hypertension: Secondary | ICD-10-CM

## 2019-05-06 MED ORDER — LOSARTAN POTASSIUM 100 MG PO TABS: ORAL_TABLET | ORAL | 0 refills | Status: AC

## 2019-05-06 NOTE — Telephone Encounter (Signed)
Post procedure phone call.  LM 

## 2019-05-09 ENCOUNTER — Ambulatory Visit: Payer: Medicare Other | Attending: Internal Medicine

## 2019-05-09 DIAGNOSIS — Z23 Encounter for immunization: Secondary | ICD-10-CM

## 2019-05-09 NOTE — Progress Notes (Signed)
   [id number]  Name:  Logan Brown.    MRN: [id number] DOB: 04-15-1931  05/09/2019  Logan Brown was observed post Covid-19 immunization for 15 minutes without incidence. He was provided with Vaccine Information Sheet and instruction to access the V-Safe system.   Logan Brown was instructed to call 911 with any severe reactions post vaccine: [name] Kitchen Difficulty breathing  . Swelling of your face and throat  . A fast heartbeat  . A bad rash all over your body  . Dizziness and weakness    Immunizations Administered    Name Date Dose VIS Date Route   Pfizer COVID-19 Vaccine 05/09/2019 12:19 PM 0.3 mL 02/18/2019 Intramuscular   Manufacturer: [place]   Lot: [id number]   [place]: [id number]

## 2019-05-11 ENCOUNTER — Telehealth: Payer: Self-pay | Admitting: Pain Medicine

## 2019-05-11 NOTE — Telephone Encounter (Signed)
Patient is calling to see if he can get his medications? Please call patient and let him know status.

## 2019-05-12 ENCOUNTER — Inpatient Hospital Stay: Payer: Medicare Other | Attending: Oncology

## 2019-05-12 ENCOUNTER — Other Ambulatory Visit: Payer: Self-pay

## 2019-05-12 ENCOUNTER — Telehealth: Payer: Self-pay

## 2019-05-12 ENCOUNTER — Telehealth: Payer: Self-pay | Admitting: *Deleted

## 2019-05-12 DIAGNOSIS — R232 Flushing: Secondary | ICD-10-CM | POA: Diagnosis not present

## 2019-05-12 DIAGNOSIS — Z79899 Other long term (current) drug therapy: Secondary | ICD-10-CM | POA: Diagnosis not present

## 2019-05-12 DIAGNOSIS — Z7982 Long term (current) use of aspirin: Secondary | ICD-10-CM | POA: Insufficient documentation

## 2019-05-12 DIAGNOSIS — C61 Malignant neoplasm of prostate: Secondary | ICD-10-CM | POA: Insufficient documentation

## 2019-05-12 DIAGNOSIS — Z923 Personal history of irradiation: Secondary | ICD-10-CM | POA: Insufficient documentation

## 2019-05-12 DIAGNOSIS — M549 Dorsalgia, unspecified: Secondary | ICD-10-CM | POA: Insufficient documentation

## 2019-05-12 DIAGNOSIS — C7951 Secondary malignant neoplasm of bone: Secondary | ICD-10-CM | POA: Diagnosis not present

## 2019-05-12 LAB — BASIC METABOLIC PANEL
Anion gap: 8 (ref 5–15)
BUN: 26 mg/dL — ABNORMAL HIGH (ref 8–23)
CO2: 21 mmol/L — ABNORMAL LOW (ref 22–32)
Calcium: 8.9 mg/dL (ref 8.9–10.3)
Chloride: 109 mmol/L (ref 98–111)
Creatinine, Ser: 1.64 mg/dL — ABNORMAL HIGH (ref 0.61–1.24)
GFR calc Af Amer: 44 mL/min — ABNORMAL LOW (ref >60)
GFR calc non Af Amer: 38 mL/min — ABNORMAL LOW (ref >60)
Glucose, Bld: 158 mg/dL — ABNORMAL HIGH (ref 70–99)
Potassium: 4.8 mmol/L (ref 3.5–5.1)
Sodium: 138 mmol/L (ref 135–145)

## 2019-05-12 NOTE — Telephone Encounter (Signed)
Called patient to let him know that he could fill his oxycodone on 05/13/19 to last until 06/12/19 and has been sent to the [facility] on [address] in [place]. Patient verbalizes u/o information.

## 2019-05-12 NOTE — Telephone Encounter (Signed)
First attempt to call patient.  No voicemail.  Will try again later.

## 2019-05-12 NOTE — Telephone Encounter (Signed)
Patient made aware of CT and bone scan scheduled for 05/26/19. Patient informed to pick up contrast prior to 05/26/19 and to drink one bottle of contrast at 9am and one bottle of contrast at 10am, and no food 4 hours prior to his scan on of 05/26/19. Patient verbalized understanding. Scheduling message sent to reschedule his MD follow-up appointment. Patient informed to expect a call from the scheduling department with date and time of follow-up appointment.

## 2019-05-13 ENCOUNTER — Telehealth: Payer: Self-pay | Admitting: Oncology

## 2019-05-13 NOTE — Telephone Encounter (Signed)
Scheduled appt per 3/4 sch message - unable to reach pt . Left message with appt date and time

## 2019-05-16 ENCOUNTER — Encounter: Payer: Self-pay | Admitting: Pain Medicine

## 2019-05-16 NOTE — Progress Notes (Signed)
Pain relief after procedure (treated area only): (Questions asked to patient) 1. Starting about 15 minutes after the procedure, and "while the area was still numb" (from the local anesthetics), were you having any of your usual pain "in that area" (the treated area)?  (NOTE: NOT including the discomfort from the needle sticks.) First 1 hour:30% better. First 4-6 hours:30 % better. 2. How long did the numbness from the local anesthetics last? (More than 6 hours?) Duration:24hours.  3. How much better is your pain now, when compared to before the procedure? Current benefit: 30 % better. 4. Can you move better now? Improvement in ROM (Range of Motion): No. 5. Can you do more now? Improvement in function: No. 4. Did you have any problems with the procedure? Side-effects/Complications: No.

## 2019-05-16 NOTE — Telephone Encounter (Signed)
Patient requesting Oxycodone. Patient advised to discuss with Dr. Dossie Arbour at his [facility] on 05-19-19.

## 2019-05-16 NOTE — Telephone Encounter (Signed)
He called back again. I told him someone would call him back in a little while. He said he would be by the phone

## 2019-05-17 NOTE — Progress Notes (Signed)
Patient: Logan Brown.  Service Category: E/M  Provider: Gaspar Cola, MD  DOB: 1931/07/03  DOS: 05/18/2019  Location: Office  MRN: [id number]  Setting: Ambulatory outpatient  Referring Provider: [name], MD  Type: Established Patient  Specialty: Interventional Pain Management  PCP: [name], MD  Location: Remote location  Delivery: TeleHealth     Virtual Encounter - Pain Management PROVIDER NOTE: Information contained herein reflects review and annotations entered in association with encounter. Interpretation of such information and data should be left to medically-trained personnel. Information provided to patient can be located elsewhere in the medical record under "Patient Instructions". Document created using STT-dictation technology, any transcriptional errors that may result from process are unintentional.    Contact & Pharmacy Preferred: [phone] Home: [phone] (home) Mobile: There is no such number on file (mobile). E-mail: No e-mail address on record  [facility] ([place]), [place] - [address] [place] ([place]) [place] [address] Phone: [phone] Fax: [phone]   Pre-screening  [name] offered "in-person" vs "virtual" encounter. He indicated preferring virtual for this encounter.   Reason COVID-19*  Social distancing based on CDC and AMA recommendations.   I contacted Logan Brown. on 05/18/2019 via telephone.      I clearly identified myself as Gaspar Cola, MD. I verified that I was speaking with the correct person using two identifiers (Name: Logan Brown., and date of birth: 1931-07-09).  Consent I sought verbal advanced consent from Logan Brown. for virtual visit interactions. I informed [name] of possible security and privacy concerns, risks, and limitations associated with providing "not-in-person" medical evaluation and management services. I also  informed [name] of the availability of "in-person" appointments. Finally, I informed him that there would be a charge for the virtual visit and that he could be  personally, fully or partially, financially responsible for it. [name] expressed understanding and agreed to proceed.   Historic Elements   [name]. is a 86 y.o. year old, male patient evaluated today after his last contact with our practice on 05/12/2019. Mr. [name]  has a past medical history of Arthritis, Atrial fibrillation ([place]), Bone cancer ([place]), Cardiomyopathy ([place]), Chronic kidney disease, Hyperlipidemia, Hypertension, Prostate cancer ([place]) (1997), and Unstable gait (04/16/2018). He also  has a past surgical history that includes Eye surgery (Bilateral); Cataract extraction; Prostate surgery; and Wrist foreign body removal. Mr. [name] has a current medication list which includes the following prescription(s): aspirin ec, calcium carbonate, vitamin d3, furosemide, gabapentin, hydroxyzine, labetalol, losartan, multiple vitamins-minerals, omeprazole, oxycodone, [START ON 06/17/2019] oxycodone, and [START ON 07/17/2019] oxycodone. He  reports that he quit smoking about 44 years ago. He has never used smokeless tobacco. He reports that he does not drink alcohol or use drugs. Mr. Disney is allergic to ace inhibitors and hydrocodone.   HPI  Today, he is being contacted for a post-procedure assessment. According to the PMP, the last two (2) prescriptions of Oxycodone have been written by [name], FNP ([place], [place], [place]). Last written 03/08/2019.  Today the patient clarified forearms with the issue with this was as it turns out, he tends to spend 6 months of the year in [place] and 6 months of the year in [place] with his family.  Clearly, because we cannot prescribe 6 months of opioid analgesics at the time, when he goes to [place], he needs to have somebody that can prescribe  medication  there for him.  This is what [name], FNP has been doing.  Today I will go ahead and put him again on the oxycodone IR 5 mg, but because he has not taken them in a while, I will have him take it every 8 hours as needed for the pain.  I reminded the patient that while he is here, he cannot get the medication from anybody else and furthermore he needs to notify Logan Brown when he goes to [place] so that we are aware that he will be receiving medications from a different physician while he is there.  He indicated having understood and agreed with the plan.  Regarding his recent bilateral L2 transforaminal ESI, he indicated to Logan Brown that it did not provide him with as much relief as he has attained in the past.  This may be secondary to the possibility that his condition may be progressing.  Post-Procedure Evaluation  Procedure (05/11/2019): Therapeutic/palliative bilateral L2 transforaminal ESI #3 under fluoroscopic guidance and IV sedation Pre-procedure pain level:  8/10 Post-procedure: 0/10 (100% relief)  Sedation: Sedation provided.  [name], RN  05/16/2019  1:35 PM  Sign when Signing Visit Pain relief after procedure (treated area only): (Questions asked to patient) 1. Starting about 15 minutes after the procedure, and "while the area was still numb" (from the local anesthetics), were you having any of your usual pain "in that area" (the treated area)?  (NOTE: NOT including the discomfort from the needle sticks.) First 1 hour: 30% better. First 4-6 hours: 30 % better. 2. How long did the numbness from the local anesthetics last? (More than 6 hours?) Duration: 24 hours.  3. How much better is your pain now, when compared to before the procedure? Current benefit: 30 % better. 4. Can you move better now? Improvement in ROM (Range of Motion): No. 5. Can you do more now? Improvement in function: No. 4. Did you have any problems with the procedure? Side-effects/Complications:  No.  Current benefits: Defined as benefit that persist at this time.   Analgesia:  <50% better Function: Back to baseline ROM: Back to baseline.  He indicates that while he is sitting down, he is not having much pain but whenever he tries to stand up and move about, that when he gets the back pain.  Pharmacotherapy Assessment  Analgesic: No opioid analgesics prescribed by our practice. Oxycodone IR  5 mg, 1 tab PO BID (15 MME/day) prescribed by [name], MD, in [place]. MME/day: 15 mg/day.   Monitoring: [place] PMP: PDMP reviewed during this encounter.       Pharmacotherapy: No side-effects or adverse reactions reported. Compliance: No problems identified. Effectiveness: Clinically acceptable. Plan: Refer to "POC".  UDS:  Summary  Date Value Ref Range Status  03/23/2018 FINAL  Final    Comment:    ==================================================================== TOXASSURE COMP DRUG ANALYSIS,UR ==================================================================== Test                             Result       Flag       Units Drug Present and Declared for Prescription Verification   Fentanyl                       9            EXPECTED   ng/mg creat   Norfentanyl  40           EXPECTED   ng/mg creat    Source of fentanyl is a scheduled prescription medication,    including IV, patch, and transmucosal formulations. Norfentanyl    is an expected metabolite of fentanyl.   Gabapentin                     PRESENT      EXPECTED Drug Present not Declared for Prescription Verification   Acetaminophen                  PRESENT      UNEXPECTED Drug Absent but Declared for Prescription Verification   Salicylate                     Not Detected UNEXPECTED    Aspirin, as indicated in the declared medication list, is not    always detected even when used as directed.   Hydroxyzine                    Not Detected  UNEXPECTED ==================================================================== Test                      Result    Flag   Units      Ref Range   Creatinine              235              mg/dL      >=20 ==================================================================== Declared Medications:  The flagging and interpretation on this report are based on the  following declared medications.  Unexpected results may arise from  inaccuracies in the declared medications.  **Note: The testing scope of this panel includes these medications:  Fentanyl (Fentanyl Patch)  Gabapentin  Hydroxyzine  **Note: The testing scope of this panel does not include small to  moderate amounts of these reported medications:  Aspirin (Aspirin 81)  **Note: The testing scope of this panel does not include following  reported medications:  Labetalol  Lisinopril  Multivitamin (MVI)  Omeprazole  Vitamin D3 ==================================================================== For clinical consultation, please call [phone]. ====================================================================    Laboratory Chemistry Profile   Renal Lab Results  Component Value Date   BUN 26 (H) 05/12/2019   CREATININE 1.64 (H) 05/12/2019   BCR 15 04/27/2019   GFRAA 44 (L) 05/12/2019   GFRNONAA 38 (L) 05/12/2019    Hepatic Lab Results  Component Value Date   AST 19 04/27/2019   ALT 17 04/27/2019   ALBUMIN 4.1 03/22/2019   ALKPHOS 67 03/22/2019    Electrolytes Lab Results  Component Value Date   NA 138 05/12/2019   K 4.8 05/12/2019   CL 109 05/12/2019   CALCIUM 8.9 05/12/2019   MG 2.2 04/27/2019   PHOS 4.6 01/10/2019    Bone Lab Results  Component Value Date   VD25OH 25 (L) 04/27/2019   25OHVITD1 41 03/23/2018   25OHVITD2 <1.0 03/23/2018   25OHVITD3 41 03/23/2018   TESTOSTERONE <3 (L) 11/17/2018    Inflammation (CRP: Acute Phase) (ESR: Chronic Phase) Lab Results  Component Value Date   CRP 6  03/23/2018   ESRSEDRATE 30 03/23/2018      Note: Above Lab results reviewed.  Imaging  DG PAIN CLINIC C-ARM 1-60 MIN NO REPORT Fluoro was used, but no Radiologist interpretation will be provided.  Please refer to "NOTES" tab for provider progress note.  Assessment  The  primary encounter diagnosis was Cancer-related pain. Diagnoses of Chronic pain syndrome, Chronic low back pain (Primary Area of Pain) (Bilateral) (R>L) w/o sciatica, 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 [facility]), Metastatic cancer to spine [facility]), Compression fracture of L2 lumbar vertebra, sequela, and Prostate cancer ([place]) were also pertinent to this visit.  Plan of Care  Problem-specific:  No problem-specific Assessment & Plan notes found for this encounter.  Mr. Gill Delrossi. has a current medication list which includes the following long-term medication(s): calcium carbonate, gabapentin, losartan, omeprazole, oxycodone, [START ON 06/17/2019] oxycodone, and [START ON 07/17/2019] oxycodone.  Pharmacotherapy (Medications Ordered): Meds ordered this encounter  Medications  . oxyCODONE (OXY IR/ROXICODONE) 5 MG immediate release tablet    Sig: Take 1 tablet (5 mg total) by mouth every 8 (eight) hours as needed for severe pain. Must last 30 days.    Dispense:  90 tablet    Refill:  0    Cancer-related Chronic Pain: STOP Act (Not applicable) Fill 1 day early if closed on refill date. Do not fill until: 05/18/2019. To last until: 06/17/2019. Avoid benzodiazepines within 8 hours of opioids  . oxyCODONE (OXY IR/ROXICODONE) 5 MG immediate release tablet    Sig: Take 1 tablet (5 mg total) by mouth every 8 (eight) hours as needed for severe pain. Must last 30 days.    Dispense:  90 tablet    Refill:  0    Cancer-related Chronic Pain: STOP Act (Not applicable) Fill 1 day early if closed on refill date. Do not fill until: 06/17/2019. To last until: 07/17/2019. Avoid benzodiazepines within 8 hours of opioids  . oxyCODONE (OXY  IR/ROXICODONE) 5 MG immediate release tablet    Sig: Take 1 tablet (5 mg total) by mouth every 8 (eight) hours as needed for severe pain. Must last 30 days.    Dispense:  90 tablet    Refill:  0    Cancer-related Chronic Pain: STOP Act (Not applicable) Fill 1 day early if closed on refill date. Do not fill until: 07/17/2019. To last until: 08/16/2019. Avoid benzodiazepines within 8 hours of opioids   Orders:  No orders of the defined types were placed in this encounter.  Follow-up plan:   Return in about 3 months (around 08/15/2019) for (VV), (MM).      Interventional management options: Planned, scheduled, and/or pending:   Therapeutic/palliative bilateral L2 transforaminal ESI #3 under fluoroscopic guidance and IV sedation   Considering:   Diagnostic IV lidocaine infusion  Diagnostic bilateral lumbar facet NB Possible bilateral lumbar facetRFA   Palliative PRN treatment(s):   Palliative bilateral L2 TFESI #3     Recent Visits Date Type Provider Dept  05/05/19 Procedure visit [name], MD [facility]  04/25/19 Telemedicine [name], MD [facility]  Showing recent visits within past 90 days and meeting all other requirements   Today's Visits Date Type Provider Dept  05/18/19 Telemedicine [name], MD [facility]  Showing today's visits and meeting all other requirements   Future Appointments No visits were found meeting these conditions.  Showing future appointments within next 90 days and meeting all other requirements   I discussed the assessment and treatment plan with the patient. The patient was provided an opportunity to ask questions and all were answered. The patient agreed with the plan and demonstrated an understanding of the instructions.  Patient advised to call back or seek an in-person evaluation if the symptoms or condition worsens.  Duration of encounter:  15 minutes.  Note by: Gaspar Cola,  MD Date: 05/18/2019; Time: 12:30 PM

## 2019-05-18 ENCOUNTER — Other Ambulatory Visit: Payer: Self-pay

## 2019-05-18 ENCOUNTER — Ambulatory Visit: Payer: Medicare Other | Attending: Pain Medicine | Admitting: Pain Medicine

## 2019-05-18 DIAGNOSIS — G894 Chronic pain syndrome: Secondary | ICD-10-CM

## 2019-05-18 DIAGNOSIS — G8929 Other chronic pain: Secondary | ICD-10-CM

## 2019-05-18 DIAGNOSIS — M545 Low back pain: Secondary | ICD-10-CM | POA: Diagnosis not present

## 2019-05-18 DIAGNOSIS — S32020S Wedge compression fracture of second lumbar vertebra, sequela: Secondary | ICD-10-CM | POA: Diagnosis not present

## 2019-05-18 DIAGNOSIS — C7951 Secondary malignant neoplasm of bone: Secondary | ICD-10-CM | POA: Diagnosis not present

## 2019-05-18 DIAGNOSIS — G893 Neoplasm related pain (acute) (chronic): Secondary | ICD-10-CM | POA: Diagnosis not present

## 2019-05-18 DIAGNOSIS — C61 Malignant neoplasm of prostate: Secondary | ICD-10-CM

## 2019-05-18 MED ORDER — OXYCODONE HCL 5 MG PO TABS: 5.0000 mg | ORAL_TABLET | Freq: Three times a day (TID) | ORAL | 0 refills | Status: DC | PRN

## 2019-05-19 ENCOUNTER — Ambulatory Visit: Payer: Medicare Other | Admitting: Oncology

## 2019-05-19 ENCOUNTER — Ambulatory Visit: Payer: Medicare Other

## 2019-05-23 ENCOUNTER — Emergency Department (HOSPITAL_COMMUNITY)
Admission: EM | Admit: 2019-05-23 | Discharge: 2019-05-23 | Disposition: A | Discharge status: Home / Self Care | Payer: Medicare Other | Attending: Emergency Medicine | Admitting: Emergency Medicine

## 2019-05-23 ENCOUNTER — Emergency Department (HOSPITAL_COMMUNITY): Payer: Medicare Other

## 2019-05-23 DIAGNOSIS — E860 Dehydration: Secondary | ICD-10-CM | POA: Diagnosis not present

## 2019-05-23 DIAGNOSIS — Z8546 Personal history of malignant neoplasm of prostate: Secondary | ICD-10-CM | POA: Insufficient documentation

## 2019-05-23 DIAGNOSIS — R404 Transient alteration of awareness: Secondary | ICD-10-CM | POA: Diagnosis not present

## 2019-05-23 DIAGNOSIS — Z79899 Other long term (current) drug therapy: Secondary | ICD-10-CM | POA: Insufficient documentation

## 2019-05-23 DIAGNOSIS — N183 Chronic kidney disease, stage 3 unspecified: Secondary | ICD-10-CM | POA: Insufficient documentation

## 2019-05-23 DIAGNOSIS — I130 Hypertensive heart and chronic kidney disease with heart failure and stage 1 through stage 4 chronic kidney disease, or unspecified chronic kidney disease: Secondary | ICD-10-CM | POA: Diagnosis not present

## 2019-05-23 DIAGNOSIS — Z85830 Personal history of malignant neoplasm of bone: Secondary | ICD-10-CM | POA: Diagnosis not present

## 2019-05-23 DIAGNOSIS — R4182 Altered mental status, unspecified: Secondary | ICD-10-CM | POA: Diagnosis present

## 2019-05-23 DIAGNOSIS — Z7982 Long term (current) use of aspirin: Secondary | ICD-10-CM | POA: Insufficient documentation

## 2019-05-23 DIAGNOSIS — I5032 Chronic diastolic (congestive) heart failure: Secondary | ICD-10-CM | POA: Insufficient documentation

## 2019-05-23 DIAGNOSIS — J9811 Atelectasis: Secondary | ICD-10-CM | POA: Diagnosis not present

## 2019-05-23 LAB — URINALYSIS, COMPLETE (UACMP) WITH MICROSCOPIC
Bacteria, UA: NONE SEEN
Bilirubin Urine: NEGATIVE
Glucose, UA: NEGATIVE mg/dL
Hgb urine dipstick: NEGATIVE
Ketones, ur: NEGATIVE mg/dL
Leukocytes,Ua: NEGATIVE
Nitrite: NEGATIVE
Protein, ur: NEGATIVE mg/dL
Specific Gravity, Urine: 1.011 (ref 1.005–1.030)
pH: 5 (ref 5.0–8.0)

## 2019-05-23 LAB — RAPID URINE DRUG SCREEN, HOSP PERFORMED
Amphetamines: NOT DETECTED
Barbiturates: NOT DETECTED
Benzodiazepines: NOT DETECTED
Cocaine: NOT DETECTED
Opiates: NOT DETECTED
Tetrahydrocannabinol: NOT DETECTED

## 2019-05-23 LAB — CBC WITH DIFFERENTIAL/PLATELET
Abs Immature Granulocytes: 0.01 10*3/uL (ref 0.00–0.07)
Basophils Absolute: 0 10*3/uL (ref 0.0–0.1)
Basophils Relative: 1 %
Eosinophils Absolute: 0.1 10*3/uL (ref 0.0–0.5)
Eosinophils Relative: 2 %
HCT: 37.8 % — ABNORMAL LOW (ref 39.0–52.0)
Hemoglobin: 12.5 g/dL — ABNORMAL LOW (ref 13.0–17.0)
Immature Granulocytes: 0 %
Lymphocytes Relative: 27 %
Lymphs Abs: 1.6 10*3/uL (ref 0.7–4.0)
MCH: 33.7 pg (ref 26.0–34.0)
MCHC: 33.1 g/dL (ref 30.0–36.0)
MCV: 101.9 fL — ABNORMAL HIGH (ref 80.0–100.0)
Monocytes Absolute: 0.6 10*3/uL (ref 0.1–1.0)
Monocytes Relative: 10 %
Neutro Abs: 3.6 10*3/uL (ref 1.7–7.7)
Neutrophils Relative %: 60 %
Platelets: 165 10*3/uL (ref 150–400)
RBC: 3.71 MIL/uL — ABNORMAL LOW (ref 4.22–5.81)
RDW: 13.1 % (ref 11.5–15.5)
WBC: 5.9 10*3/uL (ref 4.0–10.5)
nRBC: 0 % (ref 0.0–0.2)

## 2019-05-23 LAB — COMPREHENSIVE METABOLIC PANEL
ALT: 23 U/L (ref 0–44)
AST: 25 U/L (ref 15–41)
Albumin: 3.8 g/dL (ref 3.5–5.0)
Alkaline Phosphatase: 65 U/L (ref 38–126)
Anion gap: 10 (ref 5–15)
BUN: 30 mg/dL — ABNORMAL HIGH (ref 8–23)
CO2: 24 mmol/L (ref 22–32)
Calcium: 9.2 mg/dL (ref 8.9–10.3)
Chloride: 105 mmol/L (ref 98–111)
Creatinine, Ser: 2.13 mg/dL — ABNORMAL HIGH (ref 0.61–1.24)
GFR calc Af Amer: 32 mL/min — ABNORMAL LOW (ref >60)
GFR calc non Af Amer: 27 mL/min — ABNORMAL LOW (ref >60)
Glucose, Bld: 121 mg/dL — ABNORMAL HIGH (ref 70–99)
Potassium: 5.5 mmol/L — ABNORMAL HIGH (ref 3.5–5.1)
Sodium: 139 mmol/L (ref 135–145)
Total Bilirubin: 0.9 mg/dL (ref 0.3–1.2)
Total Protein: 6.5 g/dL (ref 6.5–8.1)

## 2019-05-23 LAB — LACTIC ACID, PLASMA: Lactic Acid, Venous: 1.7 mmol/L (ref 0.5–1.9)

## 2019-05-23 LAB — PROTIME-INR
INR: 1.1 (ref 0.8–1.2)
Prothrombin Time: 14.3 seconds (ref 11.4–15.2)

## 2019-05-23 LAB — ETHANOL: Alcohol, Ethyl (B): <10 mg/dL (ref <10)

## 2019-05-23 LAB — CBG MONITORING, ED: Glucose-Capillary: 100 mg/dL — ABNORMAL HIGH (ref 70–99)

## 2019-05-23 MED ORDER — SODIUM CHLORIDE 0.9 % IV SOLN: INTRAVENOUS | Status: DC

## 2019-05-23 MED ORDER — SODIUM CHLORIDE 0.9 % IV BOLUS
1000.0000 mL | Freq: Once | INTRAVENOUS | Status: AC
  Administered 2019-05-23: 14:00:00 1000 mL via INTRAVENOUS

## 2019-05-23 NOTE — ED Triage Notes (Signed)
Pt to ED via POV. hx of prostate cancer mets to bone. Presents today with increased weakness, lethargy, noted when he was running errands with visitor, pt denies current pain.

## 2019-05-23 NOTE — Discharge Instructions (Signed)
As discussed, today's evaluation has been generally reassuring, there are some suspicion for your medication contributing to your episode today.  There is some evidence for dehydration as well.  For these reasons it is very important that you follow-up with your physician, stay well-hydrated, and monitor your condition carefully.  Return here for concerning changes in your condition.

## 2019-05-23 NOTE — ED Provider Notes (Addendum)
Logan Brown Note   CSN: [id number] Arrival date & time: 05/23/19  1302     History Chief Complaint  Patient presents with  . Altered Mental Status  . Weakness    Logan Brown. is a 86 y.o. male.  HPI Computer issue, some portions of the chart were completed the day after the initial documentation.   This elderly male presents after being found listless by male companion.  Patient initially cannot provide any details of his history, which were obtained by said companion and chart review.  Level 5 caveat secondary to acuity of condition.  Patient is noted to have multiple medical issues including metastatic prostate cancer, with bone involvement.  Companion notes that not infrequently he has episodes of listlessness, minimal interactivity.  Typically these are brief, resolve spontaneously.  She does not know if they occur after any particular medication ingestion, nor any other clear precipitant.  Today in the hours prior to ED arrival the patient became listless, and with a longer lasting episode he was brought here for evaluation.  Past Medical History:  Diagnosis Date  . Arthritis   . Atrial fibrillation ([place])   . Bone cancer ([place])   . Cardiomyopathy ([place])   . Chronic kidney disease   . Hyperlipidemia   . Hypertension   . Prostate cancer ([place]) 1997  . Unstable gait 04/16/2018   unstable gait in home    Patient Active Problem List   Diagnosis Date Noted  . Chronic diastolic CHF (congestive heart failure) ([place]) 01/10/2019  . Bradycardia by electrocardiogram 05/13/2018  . Orthostatic hypotension 05/13/2018  . Renal insufficiency 04/05/2018  . Cancer-related pain 04/05/2018  . GERD (gastroesophageal reflux disease) 04/05/2018  . Neurogenic pain 04/05/2018  . 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 ([place]) 04/05/2018  . History of prostate cancer 04/05/2018  . Osteopenia of spine 04/05/2018  . Osteopenia  determined by x-ray 04/05/2018  . Osteoarthritis of facet joint of lumbar spine 04/05/2018  . Osteoarthritis involving multiple joints 04/05/2018  . DDD (degenerative disc disease), lumbar 04/05/2018  . Osteoarthritis of hip (Bilateral) 04/05/2018  . Abnormal MRI, lumbar spine 04/05/2018  . Lumbar central spinal stenosis, w/o neurogenic claudication 04/05/2018  . Lumbar foraminal stenosis (Bilateral: L4-5) (Right: L1-2, L5-S1) (Left: L2-3, L3-4) 04/05/2018  . Lumbar facet hypertrophy 04/05/2018  . Lumbar facet joint syndrome (Bilateral) 04/05/2018  . Metastatic cancer to spine ([place]) 04/05/2018  . History of kyphoplasty (L2) 04/05/2018  . Low vitamin B12 level 03/30/2018  . Chronic pain syndrome 03/23/2018  . Long term current use of opiate analgesic 03/23/2018  . Long term prescription benzodiazepine use 03/23/2018  . Pharmacologic therapy 03/23/2018  . Disorder of skeletal system 03/23/2018  . Problems influencing health status 03/23/2018  . Chronic low back pain (Primary Area of Pain) (Bilateral) (R>L) w/o sciatica 03/23/2018  . Compression fracture of L2 lumbar vertebra, sequela 01/13/2018  . Scrotal itching 01/13/2018  . Anxiety 01/13/2018  . Prostate cancer ([place]) 01/12/2018  . Hypocalcemia 01/12/2018  . Overweight (BMI 25.0-29.9) 10/16/2017  . Nonischemic cardiomyopathy ([place]) 03/27/2017  . Atrial fibrillation ([place]) 03/27/2017  . CKD (chronic kidney disease) stage 3, GFR 30-59 ml/min 10/16/2016  . Depression, major, in remission ([place]) 03/24/2016  . HTN (hypertension) 03/01/2015  . Mixed hyperlipidemia 03/01/2015  . Prediabetes 03/01/2015  . Vitamin D deficiency 03/01/2015  . Medication management 03/01/2015    Past Surgical History:  Procedure Laterality Date  . CATARACT EXTRACTION    .  EYE SURGERY Bilateral    IOL/CE on Lt in 1998 and Rt in 2011.  [name] Kitchen PROSTATE SURGERY    . WRIST FOREIGN BODY REMOVAL     1957 glass       No family history on file.  Social History     Tobacco Use  . Smoking status: Former Smoker    Quit date: 03/14/1975    Years since quitting: 44.2  . Smokeless tobacco: Never Used  Substance Use Topics  . Alcohol use: No    Alcohol/week: 0.0 standard drinks    Comment: occ  . Drug use: No    Home Medications Prior to Admission medications   Medication Sig Start Date End Date Taking? Authorizing Brown  aspirin EC 81 MG tablet Take 81 mg by mouth daily.   Yes Brown, Historical, MD  calcium carbonate (OSCAL) 1500 (600 Ca) MG TABS tablet Take 600 mg of elemental calcium by mouth 2 (two) times daily with a meal.   Yes Brown, Historical, MD  Cholecalciferol (VITAMIN D3) 125 MCG (5000 UT) CAPS Take 5,000 Units by mouth daily.    Yes Brown, Historical, MD  furosemide (LASIX) 40 MG tablet Take 40 mg by mouth every Monday, Wednesday, and Friday. 04/13/19  Yes Brown, Historical, MD  gabapentin (NEURONTIN) 600 MG tablet Take 1/2 to 1 tablet 2 to 3 x /day for chronic pain Patient taking differently: Take 600 mg by mouth in the morning, at noon, and at bedtime.  12/20/18  Yes Liane Comber, NP  hydrOXYzine (ATARAX/VISTARIL) 25 MG tablet Take 1 tablet 2 to 3 x /day if needed for Nerves or Anxiety Patient taking differently: Take 25 mg by mouth See admin instructions. Take 25 mg by mouth two to three times a day as needed for "nerves or anxiety" 04/18/19  Yes [name], MD  labetalol (NORMODYNE) 100 MG tablet Take 50-100 mg by mouth See admin instructions. Take 50 mg by mouth in the morning and 100 mg at bedtime   Yes Brown, Historical, MD  losartan (COZAAR) 100 MG tablet Take 1 tablet Daily for BP Patient taking differently: Take 100 mg by mouth daily.  05/06/19  Yes [name], MD  Multiple Vitamins-Minerals (CENTRUM SILVER ADULT 50+ PO) Take 1 tablet by mouth 3 (three) times a week.    Yes Brown, Historical, MD  omeprazole (PRILOSEC) 20 MG capsule Take 1 capsule Daily for Heartburn & Indigestion Patient taking  differently: Take 20 mg by mouth daily before breakfast.  04/13/19  Yes [name], MD  oxyCODONE (OXY IR/ROXICODONE) 5 MG immediate release tablet Take 1 tablet (5 mg total) by mouth every 8 (eight) hours as needed for severe pain. Must last 30 days. Patient taking differently: Take 5 mg by mouth every 8 (eight) hours.  05/18/19 06/17/19 Yes [name], MD  simethicone (GAS RELIEF EXTRA STRENGTH) 125 MG chewable tablet Chew 125 mg by mouth every 6 (six) hours as needed for flatulence (CHERRY (red)).   Yes Brown, Historical, MD  oxyCODONE (OXY IR/ROXICODONE) 5 MG immediate release tablet Take 1 tablet (5 mg total) by mouth every 8 (eight) hours as needed for severe pain. Must last 30 days. Patient not taking: Reported on 05/23/2019 06/17/19 07/17/19  [name], MD  oxyCODONE (OXY IR/ROXICODONE) 5 MG immediate release tablet Take 1 tablet (5 mg total) by mouth every 8 (eight) hours as needed for severe pain. Must last 30 days. Patient not taking: Reported on 05/23/2019 07/17/19 08/16/19  [name], MD    Allergies  Ace inhibitors and Hydrocodone  Review of Systems   Review of Systems  Unable to perform ROS: Acuity of condition    Physical Exam Updated Vital Signs BP 126/72   Pulse (!) 58   Temp 97.6 F (36.4 C) (Oral)   Resp 14   Ht 5\' 7"  (1.702 m)   Wt 83 kg   SpO2 99%   BMI 28.66 kg/m   Physical Exam Vitals and nursing note reviewed.  Constitutional:      Appearance: He is well-developed. He is ill-appearing.     Comments: Elderly male reacts to stimuli, otherwise listless  HENT:     Head: Normocephalic and atraumatic.  Eyes:     Conjunctiva/sclera: Conjunctivae normal.  Cardiovascular:     Rate and Rhythm: Normal rate and regular rhythm.  Pulmonary:     Effort: Pulmonary effort is normal. No respiratory distress.     Breath sounds: No stridor.  Abdominal:     General: There is no distension.  Musculoskeletal:        General: No deformity.    Skin:    General: Skin is warm and dry.  Psychiatric:        Cognition and Memory: Cognition is impaired.     ED Results / Procedures / Treatments   Labs (all labs ordered are listed, but only abnormal results are displayed) Labs Reviewed  COMPREHENSIVE METABOLIC PANEL - Abnormal; Notable for the following components:      Result Value   Potassium 5.5 (*)    Glucose, Bld 121 (*)    BUN 30 (*)    Creatinine, Ser 2.13 (*)    GFR calc non Af Amer 27 (*)    GFR calc Af Amer 32 (*)    All other components within normal limits  CBC WITH DIFFERENTIAL/PLATELET - Abnormal; Notable for the following components:   RBC 3.71 (*)    Hemoglobin 12.5 (*)    HCT 37.8 (*)    MCV 101.9 (*)    All other components within normal limits  CBG MONITORING, ED - Abnormal; Notable for the following components:   Glucose-Capillary 100 (*)    All other components within normal limits  ETHANOL  PROTIME-INR  LACTIC ACID, PLASMA  RAPID URINE DRUG SCREEN, HOSP PERFORMED  URINALYSIS, COMPLETE (UACMP) WITH MICROSCOPIC  CBG MONITORING, ED    EKG EKG Interpretation  Date/Time:  Monday May 23 2019 13:27:03 EDT Ventricular Rate:  50 PR Interval:    QRS Duration: 97 QT Interval:  461 QTC Calculation: 421 R Axis:   -17 Text Interpretation: Sinus rhythm Atrial premature complex Borderline left axis deviation T wave abnormality Abnormal ekg Confirmed by Carmin Muskrat [phone]) on 05/23/2019 3:45:14 PM   Radiology DG Chest Port 1 View  Result Date: 05/23/2019 CLINICAL DATA:  Altered mental status EXAM: PORTABLE CHEST 1 VIEW COMPARISON:  02/21/2019 FINDINGS: The heart size and mediastinal contours are stable. Mild right basilar atelectasis. No pleural effusion or pneumothorax. No acute osseous findings. IMPRESSION: Mild right basilar atelectasis. Electronically Signed   By: Davina Poke D.O.   On: 05/23/2019 13:48    Procedures Procedures (including critical care time)  Medications Ordered in  ED Medications  sodium chloride 0.9 % bolus 1,000 mL (1,000 mLs Intravenous New Bag/Given 05/23/19 1351)    And  0.9 %  sodium chloride infusion (has no administration in time range)    ED Course  I have reviewed the triage vital signs and the nursing notes.  Pertinent labs &  imaging results that were available during my care of the patient were reviewed by me and considered in my medical decision making (see chart for details).   3:44 PM Patient now fully awake, sitting upright, appropriately interactive, without any complaints.  With his male companion present we discussed all findings, concern for possible medication overuse given the absence of other acute findings, return to baseline, and his known history of chronic pain requiring narcotic use.  Patient prefers, and is appropriate for discharge for outpatient follow-up following this episode of transient alteration of awareness with concern for dehydration.  With this latter concern he has received substantial fluid resuscitation, is amenable to outpatient resuscitation orally as well. Final Clinical Impression(s) / ED Diagnoses Final diagnoses:  Transient alteration of awareness  Dehydration      Carmin Muskrat, MD 05/24/19 [id number]    Carmin Muskrat, MD 05/24/19 1559

## 2019-05-23 NOTE — ED Notes (Signed)
Pt d/c home per MD order. Discharge summary reviewed with pt, pt verbalizes understanding. Discharged home with visitor. No s.s of acute distress noted.

## 2019-05-26 ENCOUNTER — Other Ambulatory Visit: Payer: Self-pay

## 2019-05-26 ENCOUNTER — Encounter (HOSPITAL_COMMUNITY)
Admission: RE | Admit: 2019-05-26 | Discharge: 2019-05-26 | Disposition: A | Discharge status: Home / Self Care | Payer: Medicare Other | Source: Ambulatory Visit | Attending: Oncology | Admitting: Oncology

## 2019-05-26 ENCOUNTER — Ambulatory Visit (HOSPITAL_COMMUNITY)
Admission: RE | Admit: 2019-05-26 | Discharge: 2019-05-26 | Disposition: A | Discharge status: Home / Self Care | Payer: Medicare Other | Source: Ambulatory Visit | Attending: Oncology | Admitting: Oncology

## 2019-05-26 ENCOUNTER — Encounter (HOSPITAL_COMMUNITY): Payer: Self-pay

## 2019-05-26 DIAGNOSIS — C61 Malignant neoplasm of prostate: Secondary | ICD-10-CM

## 2019-05-26 DIAGNOSIS — C7951 Secondary malignant neoplasm of bone: Secondary | ICD-10-CM | POA: Diagnosis not present

## 2019-05-26 DIAGNOSIS — K760 Fatty (change of) liver, not elsewhere classified: Secondary | ICD-10-CM | POA: Diagnosis not present

## 2019-05-26 DIAGNOSIS — C801 Malignant (primary) neoplasm, unspecified: Secondary | ICD-10-CM | POA: Diagnosis not present

## 2019-05-26 MED ORDER — TECHNETIUM TC 99M MEDRONATE IV KIT
20.7000 | PACK | Freq: Once | INTRAVENOUS | Status: AC | PRN
  Administered 2019-05-26: 20.7 via INTRAVENOUS

## 2019-06-01 ENCOUNTER — Other Ambulatory Visit: Payer: Self-pay | Admitting: Internal Medicine

## 2019-06-01 ENCOUNTER — Other Ambulatory Visit: Payer: Self-pay

## 2019-06-01 DIAGNOSIS — C61 Malignant neoplasm of prostate: Secondary | ICD-10-CM

## 2019-06-01 DIAGNOSIS — C7951 Secondary malignant neoplasm of bone: Secondary | ICD-10-CM

## 2019-06-01 MED ORDER — GABAPENTIN 600 MG PO TABS: ORAL_TABLET | ORAL | 1 refills | Status: DC

## 2019-06-02 ENCOUNTER — Inpatient Hospital Stay: Payer: Medicare Other

## 2019-06-02 ENCOUNTER — Telehealth: Payer: Self-pay

## 2019-06-02 ENCOUNTER — Telehealth: Payer: Self-pay | Admitting: Pharmacist

## 2019-06-02 ENCOUNTER — Inpatient Hospital Stay (HOSPITAL_BASED_OUTPATIENT_CLINIC_OR_DEPARTMENT_OTHER): Payer: Medicare Other | Admitting: Oncology

## 2019-06-02 ENCOUNTER — Other Ambulatory Visit: Payer: Self-pay

## 2019-06-02 ENCOUNTER — Ambulatory Visit: Admitting: Urology

## 2019-06-02 VITALS — BP 160/86 | HR 69 | Temp 98.2°F | Resp 18 | Wt 188.3 lb

## 2019-06-02 DIAGNOSIS — C61 Malignant neoplasm of prostate: Secondary | ICD-10-CM

## 2019-06-02 DIAGNOSIS — C7951 Secondary malignant neoplasm of bone: Secondary | ICD-10-CM | POA: Diagnosis not present

## 2019-06-02 DIAGNOSIS — M549 Dorsalgia, unspecified: Secondary | ICD-10-CM | POA: Diagnosis not present

## 2019-06-02 DIAGNOSIS — Z7982 Long term (current) use of aspirin: Secondary | ICD-10-CM | POA: Diagnosis not present

## 2019-06-02 DIAGNOSIS — S32020S Wedge compression fracture of second lumbar vertebra, sequela: Secondary | ICD-10-CM

## 2019-06-02 DIAGNOSIS — R232 Flushing: Secondary | ICD-10-CM | POA: Diagnosis not present

## 2019-06-02 DIAGNOSIS — Z923 Personal history of irradiation: Secondary | ICD-10-CM | POA: Diagnosis not present

## 2019-06-02 DIAGNOSIS — Z79899 Other long term (current) drug therapy: Secondary | ICD-10-CM | POA: Diagnosis not present

## 2019-06-02 LAB — CBC WITH DIFFERENTIAL (CANCER CENTER ONLY)
Abs Immature Granulocytes: 0.01 10*3/uL (ref 0.00–0.07)
Basophils Absolute: 0 10*3/uL (ref 0.0–0.1)
Basophils Relative: 1 %
Eosinophils Absolute: 0.2 10*3/uL (ref 0.0–0.5)
Eosinophils Relative: 5 %
HCT: 34.1 % — ABNORMAL LOW (ref 39.0–52.0)
Hemoglobin: 11.3 g/dL — ABNORMAL LOW (ref 13.0–17.0)
Immature Granulocytes: 0 %
Lymphocytes Relative: 33 %
Lymphs Abs: 1.3 10*3/uL (ref 0.7–4.0)
MCH: 33.3 pg (ref 26.0–34.0)
MCHC: 33.1 g/dL (ref 30.0–36.0)
MCV: 100.6 fL — ABNORMAL HIGH (ref 80.0–100.0)
Monocytes Absolute: 0.4 10*3/uL (ref 0.1–1.0)
Monocytes Relative: 11 %
Neutro Abs: 1.9 10*3/uL (ref 1.7–7.7)
Neutrophils Relative %: 50 %
Platelet Count: 153 10*3/uL (ref 150–400)
RBC: 3.39 MIL/uL — ABNORMAL LOW (ref 4.22–5.81)
RDW: 13.2 % (ref 11.5–15.5)
WBC Count: 3.8 10*3/uL — ABNORMAL LOW (ref 4.0–10.5)
nRBC: 0 % (ref 0.0–0.2)

## 2019-06-02 LAB — CMP (CANCER CENTER ONLY)
ALT: 25 U/L (ref 0–44)
AST: 24 U/L (ref 15–41)
Albumin: 3.7 g/dL (ref 3.5–5.0)
Alkaline Phosphatase: 79 U/L (ref 38–126)
Anion gap: 7 (ref 5–15)
BUN: 19 mg/dL (ref 8–23)
CO2: 22 mmol/L (ref 22–32)
Calcium: 8.6 mg/dL — ABNORMAL LOW (ref 8.9–10.3)
Chloride: 111 mmol/L (ref 98–111)
Creatinine: 1.68 mg/dL — ABNORMAL HIGH (ref 0.61–1.24)
GFR, Est AFR Am: 42 mL/min — ABNORMAL LOW (ref >60)
GFR, Estimated: 36 mL/min — ABNORMAL LOW (ref >60)
Glucose, Bld: 163 mg/dL — ABNORMAL HIGH (ref 70–99)
Potassium: 4.6 mmol/L (ref 3.5–5.1)
Sodium: 140 mmol/L (ref 135–145)
Total Bilirubin: 0.5 mg/dL (ref 0.3–1.2)
Total Protein: 6.2 g/dL — ABNORMAL LOW (ref 6.5–8.1)

## 2019-06-02 MED ORDER — DENOSUMAB 120 MG/1.7ML ~~LOC~~ SOLN
SUBCUTANEOUS | Status: AC
  Filled 2019-06-02: qty 1.7

## 2019-06-02 MED ORDER — DENOSUMAB 120 MG/1.7ML ~~LOC~~ SOLN: 120.0000 mg | Freq: Once | SUBCUTANEOUS | Status: DC

## 2019-06-02 MED ORDER — XTANDI 40 MG PO CAPS: 80.0000 mg | ORAL_CAPSULE | Freq: Every day | ORAL | 1 refills | Status: DC

## 2019-06-02 NOTE — Telephone Encounter (Signed)
Oral Oncology Patient Advocate Encounter  Prior Authorization for Logan Brown has been approved.    PA# [id number] Effective dates: 06/02/19 through 03/09/20  Patients co-pay is $1936.07  Oral Oncology Clinic will continue to follow.   Logan Brown Patient [place] Phone [phone] Fax [phone] 06/02/2019 2:09 PM

## 2019-06-02 NOTE — Telephone Encounter (Signed)
Oral Oncology Patient Advocate Encounter  Received notification from [place] that prior authorization for Gillermina Phy is required.  PA submitted on CoverMyMeds Key [id number] Status is pending  Oral Oncology Clinic will continue to follow.  [place] Patient [place] Phone [phone] Fax [phone] 06/02/2019 1:58 PM

## 2019-06-02 NOTE — Progress Notes (Signed)
MD Alen Blew would like to hold Xgeva today.  Larene Beach, PharmD

## 2019-06-02 NOTE — Progress Notes (Signed)
Pt here today receiving xgeva. Calcium level at this time is 8.6 Dr. Alen Blew wants to hold the dose today. Copy of labs given to patient

## 2019-06-02 NOTE — Progress Notes (Signed)
Hematology and Oncology Follow Up Visit  Logan Brown [id number] [birth year] 86 y.o. 06/02/2019 11:52 AM [name], MDMcKeown, [name], MD   Principle Diagnosis: 86 year old man with advanced prostate cancer with disease to the bone diagnosed in 2019.  He is developing castration-resistant disease at this time.    Prior Therapy: He is status post  radical prostatectomy followed by external beam radiation and remains disease free after diagnosis in 2000.  He presented with a fall and found to have PSA of 245 and multiple osseous lesions indicating metastatic disease to the bone in May 2019.  He was started on Lupron and Xgeva at that time.    Current therapy:   Lupron every 3 months started at [facility] urology.  He is currently receiving Eligard 30 mg every 4 months with the next injection scheduled for May 2021.  Xgeva 120 mg every month started in January of 2020.  Interim History: Mr. Mukes is here for a follow-up visit.  Since the last visit, he reports no major changes in his health.  Continues to have chronic back pain which is manageable with the current pain medication which she receives via pain clinic.  He ambulates without any major difficulties at this time but does use a cane.  He denies any recent falls or syncope.  His appetite and performance status remains unchanged.  Does report hot flashes associated with Eligard.          Medications: Reviewed without changes. Current Outpatient Medications  Medication Sig Dispense Refill  . aspirin EC 81 MG tablet Take 81 mg by mouth daily.    . calcium carbonate (OSCAL) 1500 (600 Ca) MG TABS tablet Take 600 mg of elemental calcium by mouth 2 (two) times daily with a meal.    . Cholecalciferol (VITAMIN D3) 125 MCG (5000 UT) CAPS Take 5,000 Units by mouth daily.     . furosemide (LASIX) 40 MG tablet Take 40 mg by mouth every Monday, Wednesday, and Friday.    . gabapentin (NEURONTIN) 600 MG tablet Take 1/2 to 1  tablet 2 to 3 x /day for chronic pain 270 tablet 1  . hydrOXYzine (ATARAX/VISTARIL) 25 MG tablet Take 1 tablet 2 to 3 x /day if needed for Nerves or Anxiety (Patient taking differently: Take 25 mg by mouth See admin instructions. Take 25 mg by mouth two to three times a day as needed for "nerves or anxiety") 90 tablet 0  . labetalol (NORMODYNE) 100 MG tablet Take 50-100 mg by mouth See admin instructions. Take 50 mg by mouth in the morning and 100 mg at bedtime    . losartan (COZAAR) 100 MG tablet Take 1 tablet Daily for BP (Patient taking differently: Take 100 mg by mouth daily. ) 90 tablet 0  . Multiple Vitamins-Minerals (CENTRUM SILVER ADULT 50+ PO) Take 1 tablet by mouth 3 (three) times a week.     [name] Kitchen omeprazole (PRILOSEC) 20 MG capsule Take 1 capsule Daily for Heartburn & Indigestion (Patient taking differently: Take 20 mg by mouth daily before breakfast. ) 90 capsule 1  . oxyCODONE (OXY IR/ROXICODONE) 5 MG immediate release tablet Take 1 tablet (5 mg total) by mouth every 8 (eight) hours as needed for severe pain. Must last 30 days. (Patient taking differently: Take 5 mg by mouth every 8 (eight) hours. ) 90 tablet 0  . simethicone (GAS RELIEF EXTRA STRENGTH) 125 MG chewable tablet Chew 125 mg by mouth every 6 (six) hours as needed for flatulence (CHERRY (red)).  No current facility-administered medications for this visit.     Allergies:  Allergies  Allergen Reactions  . Ace Inhibitors Swelling and Other (See Comments)    Angioedema  . Hydrocodone Nausea And Vomiting       Physical Exam:  Blood pressure (!) 160/86, pulse 69, temperature 98.2 F (36.8 C), temperature source Temporal, resp. rate 18, weight 188 lb 4.8 oz (85.4 kg), SpO2 97 %.   ECOG: 2    General appearance: Alert, awake without any distress. Head: Atraumatic without abnormalities Oropharynx: Without any thrush or ulcers. Eyes: No scleral icterus. Lymph nodes: No lymphadenopathy noted in the cervical,  supraclavicular, or axillary nodes Heart:regular rate and rhythm, without any murmurs or gallops.   Lung: Clear to auscultation without any rhonchi, wheezes or dullness to percussion. Abdomin: Soft, nontender without any shifting dullness or ascites. Musculoskeletal: No clubbing or cyanosis. Neurological: No motor or sensory deficits. Skin: No rashes or lesions.        Lab Results: Lab Results  Component Value Date   WBC 5.9 05/23/2019   HGB 12.5 (L) 05/23/2019   HCT 37.8 (L) 05/23/2019   MCV 101.9 (H) 05/23/2019   PLT 165 05/23/2019     Chemistry      Component Value Date/Time   NA 139 05/23/2019 1338   NA 143 03/23/2018 1109   K 5.5 (H) 05/23/2019 1338   CL 105 05/23/2019 1338   CO2 24 05/23/2019 1338   BUN 30 (H) 05/23/2019 1338   BUN 24 03/23/2018 1109   CREATININE 2.13 (H) 05/23/2019 1338   CREATININE 1.58 (H) 04/27/2019 1514      Component Value Date/Time   CALCIUM 9.2 05/23/2019 1338   ALKPHOS 65 05/23/2019 1338   AST 25 05/23/2019 1338   AST 23 03/22/2019 1258   ALT 23 05/23/2019 1338   ALT 20 03/22/2019 1258   BILITOT 0.9 05/23/2019 1338   BILITOT 0.5 03/22/2019 1258         IMPRESSION: 1. Widespread sclerotic osseous metastasis. 2. No extraosseous metastasis identified. 3. Soft tissue density in the expected location of the prostate is favored to represent residual atrophic prostate. The clinical history describes prostatectomy. If the patient had a prostatectomy, locally recurrent disease would be a concern. Axumin PET may be informative. 4. Hepatic steatosis. 5. Small hiatal hernia. 6. Possible constipation. 7. Aortic Atherosclerosis (ICD10-I70.0).  IMPRESSION: Uptake at sacrum, L5, lateral LEFT eighth rib, and proximal to mid LEFT femoral diaphysis likely representing osseous metastases.  Numerous additional sites of sclerosis/osseous metastatic disease by CT demonstrate no uptake of tracer question treated  metastases.   Impression and Plan:   86 year old with the following:   1.    Castration-resistant prostate cancer with disease to the bone.  He presented with advanced in 2019.  The natural course of this disease was reviewed today.  He has developed a rise in his PSA despite androgen deprivation therapy.  PSA was up to 7.1 in January 2021.  Imaging studies including a CT scan and bone scan were reviewed which did not show any visceral metastasis at this time but worsening bone disease.  Treatment options were reviewed at this time.  Risks and benefits of therapy such as Jorge Ny among others were reviewed.  After discussion today, I have recommended to starting Xtandi to treat his disease.  Complications including hypertension, fatigue, hematuria and rarely seizures were reviewed.  We will start at a reduced dose of 80 mg daily and titrate up for better  tolerance.   2.  Androgen deprivation: Next Eligard will be in 2 months.  Long-term complications including hot flashes and weight gain were reiterated.  3.  Bone directed therapy: He has tolerated Xgeva reasonably well and he will continue to receive it every 2 months.  Osteonecrosis of the jaw and hypocalcemia complications were discussed.   4.  Prognosis and goals of care: Therapy remains palliative and aggressive measures are warranted.  His disease is incurable.  5.  Back pain: Chronic in nature and preceded his prostate cancer.  He is currently receiving pain medication via pain clinic.  6.  Follow-up:  In 2 months for repeat follow-up.  30  minutes were dedicated to this visit.  The time was spent on reviewing imaging studies, laboratory data disease status update and organizing future plan of care.     Zola Button, MD 3/25/202111:52 AM

## 2019-06-02 NOTE — Telephone Encounter (Signed)
Oral Oncology Pharmacist Encounter  Received new prescription for Xtandi (enzalutamide) for the treatment of metastatic castrate resistant prostate cancer in conjunction with Eligard, planned duration until disease progression or unacceptable drug toxicity.  BP from 06/02/19 assessed, BP elevated continue to monitor. Optimize HTN management if needed. Prescription dose and frequency assessed.   Current medication list in Epic reviewed, several DDIs with enzalutamide identified: - Xtandi may decrease the serum concentration of losartan, oxycodone, omeprazole. Monitor patient for decreased effectiveness of the listed medications. No baseline dose adjustment needed.   Prescription has been e-scribed to the [facility] for benefits analysis and approval.  Oral Oncology Clinic will continue to follow for insurance authorization, copayment issues, initial counseling and start date.  Darl Pikes, PharmD, BCPS, BCOP, CPP Hematology/Oncology Clinical Pharmacist [facility] [place] [facility] [phone]  06/02/2019 2:39 PM

## 2019-06-03 ENCOUNTER — Telehealth: Payer: Self-pay | Admitting: Oncology

## 2019-06-03 LAB — PROSTATE-SPECIFIC AG, SERUM (LABCORP): Prostate Specific Ag, Serum: 28.3 ng/mL — ABNORMAL HIGH (ref 0.0–4.0)

## 2019-06-03 NOTE — Telephone Encounter (Signed)
Scheduled appt per 3/25 los.  Sent a message to HIM pool to get a calendar mailed out. 

## 2019-06-07 ENCOUNTER — Ambulatory Visit: Payer: Medicare Other | Attending: Internal Medicine

## 2019-06-07 DIAGNOSIS — Z23 Encounter for immunization: Secondary | ICD-10-CM

## 2019-06-07 NOTE — Progress Notes (Signed)
   [id number] Vaccination Clinic  Name:  Logan Brown.    MRN: [id number] DOB: 08/30/1931  06/07/2019  [name] was observed post Covid-19 immunization for 15 minutes without incident. He was provided with Vaccine Information Sheet and instruction to access the V-Safe system.   [name] was instructed to call 911 with any severe reactions post vaccine: [name] Kitchen Difficulty breathing  . Swelling of face and throat  . A fast heartbeat  . A bad rash all over body  . Dizziness and weakness   Immunizations Administered    Name Date Dose VIS Date Route   Pfizer COVID-19 Vaccine 06/07/2019 12:30 PM 0.3 mL 02/18/2019 Intramuscular   Manufacturer: [employer]   Lot: [id number]   [place]: [id number]

## 2019-06-08 ENCOUNTER — Telehealth: Payer: Self-pay

## 2019-06-08 NOTE — Telephone Encounter (Signed)
I have attempted to call patient and left messages several times, with no call back, to see if he could come into the office to pick up samples of Xtandi and sign the application for manufacturer assistance.  I will continue to try to reach patient.  [place] Patient Logan Brown Phone [phone] Fax [phone] 06/08/2019 3:21 PM

## 2019-06-09 NOTE — Telephone Encounter (Signed)
I was able to reach patient. He stated he would come pick up samples and sign application when he could find a ride.  I instructed him to ask for Benjamine Mola or [place] in oral chemo at the front desk.  [place] Patient [place] Phone [phone] Fax [phone] 06/09/2019 12:29 PM

## 2019-06-13 NOTE — Telephone Encounter (Signed)
Oral Chemotherapy Pharmacist Encounter  Patient stopped by to sign manufacturer assistance application for Xtandi. Patient provided with Xtandi bottle to get started with today.  Patient Education I spoke with patient in lobby for overview of new oral chemotherapy medication: Xtandi (enzalutamide) for the treatment of metastatic castrate resistant prostate cancer in conjunction with Eligard, planned duration until disease progression or unacceptable drug toxicity.   Pt is doing well. Counseled patient on administration, dosing, side effects, monitoring, drug-food interactions, safe handling, storage, and disposal. Patient will take 2 capsules (80 mg total) by mouth daily.  Side effects include but not limited to: HTN, fatigue, hot flashes.    Reviewed with patient importance of keeping a medication schedule and plan for any missed doses.  Mr. Amesquita voiced understanding and appreciation. All questions answered. Medication handout provided.  Provided patient with [facility] [place] [facility] phone number. Patient knows to call the office with questions or concerns. Oral Chemotherapy Navigation Clinic will continue to follow.  Darl Pikes, PharmD, BCPS, BCOP, CPP Hematology/Oncology Clinical Pharmacist [facility] [place] [facility] [phone]  06/13/2019 12:19 PM

## 2019-06-13 NOTE — Telephone Encounter (Signed)
Oral Chemotherapy Pharmacist Encounter   Patient came by to sign the manufacturer assistance application for Xtandi and to pick up a Xtandi sample. Application awaiting Md signature.   Darl Pikes, PharmD, BCPS, BCOP, CPP Hematology/Oncology Clinical Pharmacist [facility] [place] [facility] [phone]  06/13/2019 12:17 PM

## 2019-06-14 ENCOUNTER — Ambulatory Visit: Payer: Medicare Other

## 2019-06-14 ENCOUNTER — Other Ambulatory Visit: Payer: Medicare Other

## 2019-06-14 NOTE — Telephone Encounter (Signed)
Oral Chemotherapy Pharmacist Encounter  Met patient in lobby room to complete application for [place] in an effort to reduce patient's out of pocket expense for Xtandi to $0.    Xtandi patient assistance phone number for follow up is [phone].   This encounter will be updated until final determination.   Darl Pikes, PharmD, BCPS, [facility] Hematology/Oncology Clinical Pharmacist [facility]/AP [facility] [phone]  06/14/2019 3:45 PM

## 2019-06-17 NOTE — Telephone Encounter (Signed)
Patient was approved for Xtandi at $0 through [employer] 06/16/19-03/09/20  [place] phone [phone]  [place] Patient [place] Phone [phone] Fax [phone] 06/17/2019 8:24 AM

## 2019-06-24 NOTE — Telephone Encounter (Signed)
Manufacturer assistance pharmacy has been trying to contact patient to set up delivery of Xtandi. They have been unable to reach him, left messages with no return call.   I also attempted to reach patient.  Patient should call the pharmacy at [phone] to set up delivery of Xtandi at no charge from manufacturer.  [place] Patient [place] Phone [phone] Fax [phone] 06/24/2019 9:45 AM

## 2019-06-30 ENCOUNTER — Other Ambulatory Visit: Payer: Self-pay

## 2019-06-30 ENCOUNTER — Other Ambulatory Visit: Payer: Medicare Other | Admitting: Hospice

## 2019-06-30 DIAGNOSIS — G893 Neoplasm related pain (acute) (chronic): Secondary | ICD-10-CM

## 2019-06-30 DIAGNOSIS — Z515 Encounter for palliative care: Secondary | ICD-10-CM

## 2019-06-30 DIAGNOSIS — C7951 Secondary malignant neoplasm of bone: Secondary | ICD-10-CM

## 2019-06-30 NOTE — Progress Notes (Signed)
[place] Consult Note Telephone: [phone]  Fax: [phone]  PATIENT NAME: Logan Brown. DOB: 03/15/1931 MRN: [id number]  PRIMARY CARE PROVIDER:   [name], MD  REFERRING PROVIDER:  [name], [address] [place],  [employer] [address]  RESPONSIBLE PARTY:[id number] Extended Emergency Contact Information Primary Emergency Contact: [name],Ginger [place] Mobile Phone: [phone] Relation: Friend TELEHEALTH VISIT STATEMENT Due to the COVID-19 crisis, this visit was done via telephone from my office. It was initiated and consented to by this patient and/or family.  RECOMMENDATIONS/PLAN: 1. Advance Care Planning/Goals of Care:Telehealth visit consisted of building trustand follow up for palliative care.Patientremains a DNR with goals of care including to maximize quality of life and symptomrelief. 2. Symptom management: Syncopal episode  02/21/2019, 05/23/2019; he reports  none since then and he is back to his baseline; Patient said overall he is doing well and his back pain managed with his current pain medication received through a pain clinic; he is not sure the names of his current medications. He accepted in person visit for further evaluation. Chart review showed patient presented with a fall and was seen by Dr Verner Mould 06/02/2019, in which he was started on Lupron and Xgeva. Patient in no acute distress/concerns at this time. 3. Will follow up with patient in person visit 08/11/2019 to further discuss goals of care clarification.  I spent20minutes providing this consultation; time includes chart review and documentation.  Morethan 50% of the time in this consultation was spent coordinating communication.  HISTORY OF PRESENT ILLNESS:Logan Brown a 86 y.o.year oldmalewith multiple medical problems including arthritis, prostate cancer, bone pain, a  fib. Palliative Care was asked to help address goals of care  CODE STATUS: DNR  PPS: 50% HOSPICE ELIGIBILITY/DIAGNOSIS: TBD  PAST MEDICAL HISTORY:  Past Medical History:  Diagnosis Date  . Arthritis   . Atrial fibrillation ([place])   . Bone cancer ([place])   . Cardiomyopathy ([place])   . Chronic kidney disease   . Hyperlipidemia   . Hypertension   . Prostate cancer ([place]) 1997  . Unstable gait 84/09/2018   unstable gait in home    SOCIAL HX:  Social History   Tobacco Use  . Smoking status: Former Smoker    Quit date: 84/06/1975    Years since quitting: 44.3  . Smokeless tobacco: Never Used  Substance Use Topics  . Alcohol use: No    Alcohol/week: 0.0 standard drinks    Comment: occ    ALLERGIES:  Allergies  Allergen Reactions  . Ace Inhibitors Swelling and Other (See Comments)    Angioedema  . Hydrocodone Nausea And Vomiting     PERTINENT MEDICATIONS:  Outpatient Encounter Medications as of 84/22/2021  Medication Sig  . aspirin EC 81 MG tablet Take 81 mg by mouth daily.  . calcium carbonate (OSCAL) 1500 (600 Ca) MG TABS tablet Take 600 mg of elemental calcium by mouth 2 (two) times daily with a meal.  . Cholecalciferol (VITAMIN D3) 125 MCG (5000 UT) CAPS Take 5,000 Units by mouth daily.   . enzalutamide (XTANDI) 40 MG capsule Take 2 capsules (80 mg total) by mouth daily.  . furosemide (LASIX) 40 MG tablet Take 40 mg by mouth every Monday, Wednesday, and Friday.  . gabapentin (NEURONTIN) 600 MG tablet Take 1/2 to 1 tablet 2 to 3 x /day for chronic pain  . hydrOXYzine (ATARAX/VISTARIL) 25 MG tablet Take 1 tablet 2 to 3 x /day if needed  for Nerves or Anxiety (Patient taking differently: Take 25 mg by mouth See admin instructions. Take 25 mg by mouth two to three times a day as needed for "nerves or anxiety")  . labetalol (NORMODYNE) 100 MG tablet Take 50-100 mg by mouth See admin instructions. Take 50 mg by mouth in the morning and 100 mg at bedtime  . losartan (COZAAR) 100 MG  tablet Take 1 tablet Daily for BP (Patient taking differently: Take 100 mg by mouth daily. )  . Multiple Vitamins-Minerals (CENTRUM SILVER ADULT 50+ PO) Take 1 tablet by mouth 3 (three) times a week.   [name] Kitchen omeprazole (PRILOSEC) 20 MG capsule Take 1 capsule Daily for Heartburn & Indigestion (Patient taking differently: Take 20 mg by mouth daily before breakfast. )  . oxyCODONE (OXY IR/ROXICODONE) 5 MG immediate release tablet Take 1 tablet (5 mg total) by mouth every 8 (eight) hours as needed for severe pain. Must last 30 days. (Patient taking differently: Take 5 mg by mouth every 8 (eight) hours. )  . simethicone (GAS RELIEF EXTRA STRENGTH) 125 MG chewable tablet Chew 125 mg by mouth every 6 (six) hours as needed for flatulence (CHERRY (red)).   No facility-administered encounter medications on file as of 06/30/2019.     Teodoro Spray, NP

## 2019-07-18 ENCOUNTER — Telehealth: Payer: Self-pay | Admitting: Pain Medicine

## 2019-07-18 NOTE — Telephone Encounter (Signed)
I called the pharmacy, he picked up the script dated 07-17-19.

## 2019-07-18 NOTE — Telephone Encounter (Signed)
Patient lvmail on 07-15-19 at 10:27 stating he needs his pain meds script. Please check. Looks like only 1 script written in March. But next appt per Dr. Dossie Arbour is 08-15-19 for MM

## 2019-08-03 ENCOUNTER — Inpatient Hospital Stay (HOSPITAL_BASED_OUTPATIENT_CLINIC_OR_DEPARTMENT_OTHER): Payer: Medicare Other | Admitting: Oncology

## 2019-08-03 ENCOUNTER — Inpatient Hospital Stay: Payer: Medicare Other | Attending: Oncology

## 2019-08-03 ENCOUNTER — Inpatient Hospital Stay: Payer: Medicare Other

## 2019-08-03 ENCOUNTER — Other Ambulatory Visit: Payer: Self-pay

## 2019-08-03 VITALS — BP 123/92 | HR 73 | Temp 97.7°F | Resp 18 | Ht 67.0 in | Wt 182.2 lb

## 2019-08-03 DIAGNOSIS — C61 Malignant neoplasm of prostate: Secondary | ICD-10-CM

## 2019-08-03 DIAGNOSIS — C7951 Secondary malignant neoplasm of bone: Secondary | ICD-10-CM | POA: Insufficient documentation

## 2019-08-03 DIAGNOSIS — Z9079 Acquired absence of other genital organ(s): Secondary | ICD-10-CM | POA: Diagnosis not present

## 2019-08-03 DIAGNOSIS — S32020S Wedge compression fracture of second lumbar vertebra, sequela: Secondary | ICD-10-CM

## 2019-08-03 DIAGNOSIS — Z5111 Encounter for antineoplastic chemotherapy: Secondary | ICD-10-CM | POA: Insufficient documentation

## 2019-08-03 DIAGNOSIS — G893 Neoplasm related pain (acute) (chronic): Secondary | ICD-10-CM | POA: Diagnosis not present

## 2019-08-03 DIAGNOSIS — Z923 Personal history of irradiation: Secondary | ICD-10-CM | POA: Insufficient documentation

## 2019-08-03 LAB — CMP (CANCER CENTER ONLY)
ALT: 15 U/L (ref 0–44)
AST: 17 U/L (ref 15–41)
Albumin: 3.8 g/dL (ref 3.5–5.0)
Alkaline Phosphatase: 63 U/L (ref 38–126)
Anion gap: 8 (ref 5–15)
BUN: 27 mg/dL — ABNORMAL HIGH (ref 8–23)
CO2: 21 mmol/L — ABNORMAL LOW (ref 22–32)
Calcium: 9 mg/dL (ref 8.9–10.3)
Chloride: 111 mmol/L (ref 98–111)
Creatinine: 1.65 mg/dL — ABNORMAL HIGH (ref 0.61–1.24)
GFR, Est AFR Am: 43 mL/min — ABNORMAL LOW (ref >60)
GFR, Estimated: 37 mL/min — ABNORMAL LOW (ref >60)
Glucose, Bld: 128 mg/dL — ABNORMAL HIGH (ref 70–99)
Potassium: 4.4 mmol/L (ref 3.5–5.1)
Sodium: 140 mmol/L (ref 135–145)
Total Bilirubin: 0.5 mg/dL (ref 0.3–1.2)
Total Protein: 6.6 g/dL (ref 6.5–8.1)

## 2019-08-03 LAB — CBC WITH DIFFERENTIAL (CANCER CENTER ONLY)
Abs Immature Granulocytes: 0.02 10*3/uL (ref 0.00–0.07)
Basophils Absolute: 0 10*3/uL (ref 0.0–0.1)
Basophils Relative: 1 %
Eosinophils Absolute: 0.2 10*3/uL (ref 0.0–0.5)
Eosinophils Relative: 4 %
HCT: 34.9 % — ABNORMAL LOW (ref 39.0–52.0)
Hemoglobin: 12 g/dL — ABNORMAL LOW (ref 13.0–17.0)
Immature Granulocytes: 0 %
Lymphocytes Relative: 31 %
Lymphs Abs: 1.6 10*3/uL (ref 0.7–4.0)
MCH: 33.9 pg (ref 26.0–34.0)
MCHC: 34.4 g/dL (ref 30.0–36.0)
MCV: 98.6 fL (ref 80.0–100.0)
Monocytes Absolute: 0.5 10*3/uL (ref 0.1–1.0)
Monocytes Relative: 9 %
Neutro Abs: 2.8 10*3/uL (ref 1.7–7.7)
Neutrophils Relative %: 55 %
Platelet Count: 143 10*3/uL — ABNORMAL LOW (ref 150–400)
RBC: 3.54 MIL/uL — ABNORMAL LOW (ref 4.22–5.81)
RDW: 13.7 % (ref 11.5–15.5)
WBC Count: 5 10*3/uL (ref 4.0–10.5)
nRBC: 0 % (ref 0.0–0.2)

## 2019-08-03 MED ORDER — LEUPROLIDE ACETATE (4 MONTH) 30 MG ~~LOC~~ KIT
PACK | SUBCUTANEOUS | Status: AC
  Filled 2019-08-03: qty 30

## 2019-08-03 MED ORDER — DENOSUMAB 120 MG/1.7ML ~~LOC~~ SOLN
SUBCUTANEOUS | Status: AC
  Filled 2019-08-03: qty 1.7

## 2019-08-03 MED ORDER — DENOSUMAB 120 MG/1.7ML ~~LOC~~ SOLN
120.0000 mg | Freq: Once | SUBCUTANEOUS | Status: AC
  Administered 2019-08-03: 120 mg via SUBCUTANEOUS

## 2019-08-03 MED ORDER — LEUPROLIDE ACETATE (4 MONTH) 30 MG ~~LOC~~ KIT
30.0000 mg | PACK | Freq: Once | SUBCUTANEOUS | Status: AC
  Administered 2019-08-03: 30 mg via SUBCUTANEOUS

## 2019-08-03 NOTE — Patient Instructions (Signed)
Denosumab injection What is this medicine? DENOSUMAB (den oh sue mab) slows bone breakdown. Prolia is used to treat osteoporosis in women after menopause and in men, and in people who are taking corticosteroids for 6 months or more. Xgeva is used to treat a high calcium level due to cancer and to prevent bone fractures and other bone problems caused by multiple myeloma or cancer bone metastases. Xgeva is also used to treat giant cell tumor of the bone. This medicine may be used for other purposes; ask your health care provider or pharmacist if you have questions. COMMON BRAND NAME(S): Prolia, XGEVA What should I tell my health care provider before I take this medicine? They need to know if you have any of these conditions:  dental disease  having surgery or tooth extraction  infection  kidney disease  low levels of calcium or Vitamin D in the blood  malnutrition  on hemodialysis  skin conditions or sensitivity  thyroid or parathyroid disease  an unusual reaction to denosumab, other medicines, foods, dyes, or preservatives  pregnant or trying to get pregnant  breast-feeding How should I use this medicine? This medicine is for injection under the skin. It is given by a health care professional in a hospital or clinic setting. A special MedGuide will be given to you before each treatment. Be sure to read this information carefully each time. For Prolia, talk to your pediatrician regarding the use of this medicine in children. Special care may be needed. For Xgeva, talk to your pediatrician regarding the use of this medicine in children. While this drug may be prescribed for children as young as 13 years for selected conditions, precautions do apply. Overdosage: If you think you have taken too much of this medicine contact a poison control center or emergency room at once. NOTE: This medicine is only for you. Do not share this medicine with others. What if I miss a dose? It is  important not to miss your dose. Call your doctor or health care professional if you are unable to keep an appointment. What may interact with this medicine? Do not take this medicine with any of the following medications:  other medicines containing denosumab This medicine may also interact with the following medications:  medicines that lower your chance of fighting infection  steroid medicines like prednisone or cortisone This list may not describe all possible interactions. Give your health care provider a list of all the medicines, herbs, non-prescription drugs, or dietary supplements you use. Also tell them if you smoke, drink alcohol, or use illegal drugs. Some items may interact with your medicine. What should I watch for while using this medicine? Visit your doctor or health care professional for regular checks on your progress. Your doctor or health care professional may order blood tests and other tests to see how you are doing. Call your doctor or health care professional for advice if you get a fever, chills or sore throat, or other symptoms of a cold or flu. Do not treat yourself. This drug may decrease your body's ability to fight infection. Try to avoid being around people who are sick. You should make sure you get enough calcium and vitamin D while you are taking this medicine, unless your doctor tells you not to. Discuss the foods you eat and the vitamins you take with your health care professional. See your dentist regularly. Brush and floss your teeth as directed. Before you have any dental work done, tell your dentist you are   receiving this medicine. Do not become pregnant while taking this medicine or for 5 months after stopping it. Talk with your doctor or health care professional about your birth control options while taking this medicine. Women should inform their doctor if they wish to become pregnant or think they might be pregnant. There is a potential for serious side  effects to an unborn child. Talk to your health care professional or pharmacist for more information. What side effects may I notice from receiving this medicine? Side effects that you should report to your doctor or health care professional as soon as possible:  allergic reactions like skin rash, itching or hives, swelling of the face, lips, or tongue  bone pain  breathing problems  dizziness  jaw pain, especially after dental work  redness, blistering, peeling of the skin  signs and symptoms of infection like fever or chills; cough; sore throat; pain or trouble passing urine  signs of low calcium like fast heartbeat, muscle cramps or muscle pain; pain, tingling, numbness in the hands or feet; seizures  unusual bleeding or bruising  unusually weak or tired Side effects that usually do not require medical attention (report to your doctor or health care professional if they continue or are bothersome):  constipation  diarrhea  headache  joint pain  loss of appetite  muscle pain  runny nose  tiredness  upset stomach This list may not describe all possible side effects. Call your doctor for medical advice about side effects. You may report side effects to FDA at 1-800-FDA-1088. Where should I keep my medicine? This medicine is only given in a clinic, doctor's office, or other health care setting and will not be stored at home. NOTE: This sheet is a summary. It may not cover all possible information. If you have questions about this medicine, talk to your doctor, pharmacist, or health care provider.  2020 Elsevier/Gold Standard (2017-07-03 16:10:44) Leuprolide depot injection What is this medicine? LEUPROLIDE (loo PROE lide) is a man-made protein that acts like a natural hormone in the body. It decreases testosterone in men and decreases estrogen in women. In men, this medicine is used to treat advanced prostate cancer. In women, some forms of this medicine may be used  to treat endometriosis, uterine fibroids, or other male hormone-related problems. This medicine may be used for other purposes; ask your health care provider or pharmacist if you have questions. COMMON BRAND NAME(S): Eligard, Fensolv, Lupron Depot, Lupron Depot-Ped, Viadur What should I tell my health care provider before I take this medicine? They need to know if you have any of these conditions:  diabetes  heart disease or previous heart attack  high blood pressure  high cholesterol  mental illness  osteoporosis  pain or difficulty passing urine  seizures  spinal cord metastasis  stroke  suicidal thoughts, plans, or attempt; a previous suicide attempt by you or a family member  tobacco smoker  unusual vaginal bleeding (women)  an unusual or allergic reaction to leuprolide, benzyl alcohol, other medicines, foods, dyes, or preservatives  pregnant or trying to get pregnant  breast-feeding How should I use this medicine? This medicine is for injection into a muscle or for injection under the skin. It is given by a health care professional in a hospital or clinic setting. The specific product will determine how it will be given to you. Make sure you understand which product you receive and how often you will receive it. Talk to your pediatrician regarding the use of   this medicine in children. Special care may be needed. Overdosage: If you think you have taken too much of this medicine contact a poison control center or emergency room at once. NOTE: This medicine is only for you. Do not share this medicine with others. What if I miss a dose? It is important not to miss a dose. Call your doctor or health care professional if you are unable to keep an appointment. Depot injections: Depot injections are given either once-monthly, every 12 weeks, every 16 weeks, or every 24 weeks depending on the product you are prescribed. The product you are prescribed will be based on if you  are male or male, and your condition. Make sure you understand your product and dosing. What may interact with this medicine? Do not take this medicine with any of the following medications:  chasteberry This medicine may also interact with the following medications:  herbal or dietary supplements, like black cohosh or DHEA  male hormones, like estrogens or progestins and birth control pills, patches, rings, or injections  male hormones, like testosterone This list may not describe all possible interactions. Give your health care provider a list of all the medicines, herbs, non-prescription drugs, or dietary supplements you use. Also tell them if you smoke, drink alcohol, or use illegal drugs. Some items may interact with your medicine. What should I watch for while using this medicine? Visit your doctor or health care professional for regular checks on your progress. During the first weeks of treatment, your symptoms may get worse, but then will improve as you continue your treatment. You may get hot flashes, increased bone pain, increased difficulty passing urine, or an aggravation of nerve symptoms. Discuss these effects with your doctor or health care professional, some of them may improve with continued use of this medicine. Male patients may experience a menstrual cycle or spotting during the first months of therapy with this medicine. If this continues, contact your doctor or health care professional. This medicine may increase blood sugar. Ask your healthcare provider if changes in diet or medicines are needed if you have diabetes. What side effects may I notice from receiving this medicine? Side effects that you should report to your doctor or health care professional as soon as possible:  allergic reactions like skin rash, itching or hives, swelling of the face, lips, or tongue  breathing problems  chest pain  depression or memory disorders  pain in your legs or  groin  pain at site where injected or implanted  seizures  severe headache  signs and symptoms of high blood sugar such as being more thirsty or hungry or having to urinate more than normal. You may also feel very tired or have blurry vision  swelling of the feet and legs  suicidal thoughts or other mood changes  visual changes  vomiting Side effects that usually do not require medical attention (report to your doctor or health care professional if they continue or are bothersome):  breast swelling or tenderness  decrease in sex drive or performance  diarrhea  hot flashes  loss of appetite  muscle, joint, or bone pains  nausea  redness or irritation at site where injected or implanted  skin problems or acne This list may not describe all possible side effects. Call your doctor for medical advice about side effects. You may report side effects to FDA at 1-800-FDA-1088. Where should I keep my medicine? This drug is given in a hospital or clinic and will not be  stored at home. NOTE: This sheet is a summary. It may not cover all possible information. If you have questions about this medicine, talk to your doctor, pharmacist, or health care provider.  2020 Elsevier/Gold Standard (2017-12-24 09:27:03)

## 2019-08-03 NOTE — Progress Notes (Signed)
Hematology and Oncology Follow Up Visit  Logan Brown [id number] 12/17/1931 86 y.o. 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  He was initially diagnosed with localized disease in year 2000.    Prior Therapy: He is status post  radical prostatectomy followed by external beam radiation and remains disease free after diagnosis in 2000.  He presented with a fall and found to have PSA of 245 and multiple osseous lesions indicating metastatic disease to the bone in May 2019.  He was started on Lupron and Xgeva at that time.    Current therapy:   Lupron every 3 months started at [facility] urology.  He is currently receiving Eligard 30 mg every 4 months.  He will receive Eligard today and repeated in September 2021.  Xgeva 120 mg every month started in January of 2020.  Xtandi 80 mg daily started in April 2021.  Interim History: Logan Brown returns today for a repeat evaluation.  Since the last visit, he was started on Xtandi at 80 mg daily which she has tolerated without any major complications.  He did report 2-3 episodes of loose bowel habits and fecal incontinence because of it.  He does report some mild fatigue tiredness periodically but overall his performance status and quality of life dramatically different.  He denied nausea vomiting or edema.  He still ambulates with the help of a cane without any recent falls or syncope.  He has back pain is currently manageable with oxycodone.          Medications: Updated on review. Current Outpatient Medications  Medication Sig Dispense Refill  . aspirin EC 81 MG tablet Take 81 mg by mouth daily.    . calcium carbonate (OSCAL) 1500 (600 Ca) MG TABS tablet Take 600 mg of elemental calcium by mouth 2 (two) times daily with a meal.    . Cholecalciferol (VITAMIN D3) 125 MCG (5000 UT) CAPS Take  5,000 Units by mouth daily.     . enzalutamide (XTANDI) 40 MG capsule Take 2 capsules (80 mg total) by mouth daily. 60 capsule 1  . furosemide (LASIX) 40 MG tablet Take 40 mg by mouth every Monday, Wednesday, and Friday.    . gabapentin (NEURONTIN) 600 MG tablet Take 1/2 to 1 tablet 2 to 3 x /day for chronic pain 270 tablet 1  . hydrOXYzine (ATARAX/VISTARIL) 25 MG tablet Take 1 tablet 2 to 3 x /day if needed for Nerves or Anxiety (Patient taking differently: Take 25 mg by mouth See admin instructions. Take 25 mg by mouth two to three times a day as needed for "nerves or anxiety") 90 tablet 0  . labetalol (NORMODYNE) 100 MG tablet Take 50-100 mg by mouth See admin instructions. Take 50 mg by mouth in the morning and 100 mg at bedtime    . losartan (COZAAR) 100 MG tablet Take 1 tablet Daily for BP (Patient taking differently: Take 100 mg by mouth daily. ) 90 tablet 0  . Multiple Vitamins-Minerals (CENTRUM SILVER ADULT 50+ PO) Take 1 tablet by mouth 3 (three) times a week.     [name] Kitchen omeprazole (PRILOSEC) 20 MG capsule Take 1 capsule Daily for Heartburn & Indigestion (Patient taking differently: Take 20 mg by mouth daily before breakfast. ) 90 capsule 1  . oxyCODONE (OXY IR/ROXICODONE) 5 MG immediate release tablet Take 1 tablet (5 mg total) by mouth every 8 (eight)  hours as needed for severe pain. Must last 30 days. (Patient taking differently: Take 5 mg by mouth every 8 (eight) hours. ) 90 tablet 0  . simethicone (GAS RELIEF EXTRA STRENGTH) 125 MG chewable tablet Chew 125 mg by mouth every 6 (six) hours as needed for flatulence (CHERRY (red)).     No current facility-administered medications for this visit.     Allergies:  Allergies  Allergen Reactions  . Ace Inhibitors Swelling and Other (See Comments)    Angioedema  . Hydrocodone Nausea And Vomiting       Physical Exam:  Blood pressure (!) 123/92, pulse 73, temperature 97.7 F (36.5 C), temperature source Temporal, resp. rate 18, height 5'  7" (1.702 m), weight 182 lb 3.2 oz (82.6 kg), SpO2 99 %.    ECOG: 2   General appearance: Comfortable appearing without any discomfort Head: Normocephalic without any trauma Oropharynx: Mucous membranes are moist and pink without any thrush or ulcers. Eyes: Pupils are equal and round reactive to light. Lymph nodes: No cervical, supraclavicular, inguinal or axillary lymphadenopathy.   Heart:regular rate and rhythm.  S1 and S2 without leg edema. Lung: Clear without any rhonchi or wheezes.  No dullness to percussion. Abdomin: Soft, nontender, nondistended with good bowel sounds.  No hepatosplenomegaly. Musculoskeletal: No joint deformity or effusion.  Full range of motion noted. Neurological: No deficits noted on motor, sensory and deep tendon reflex exam. Skin: No petechial rash or dryness.  Appeared moist.          Lab Results: Lab Results  Component Value Date   WBC 3.8 (L) 06/02/2019   HGB 11.3 (L) 06/02/2019   HCT 34.1 (L) 06/02/2019   MCV 100.6 (H) 06/02/2019   PLT 153 06/02/2019     Chemistry      Component Value Date/Time   NA 140 06/02/2019 1138   NA 143 03/23/2018 1109   K 4.6 06/02/2019 1138   CL 111 06/02/2019 1138   CO2 22 06/02/2019 1138   BUN 19 06/02/2019 1138   BUN 24 03/23/2018 1109   CREATININE 1.68 (H) 06/02/2019 1138   CREATININE 1.58 (H) 04/27/2019 1514      Component Value Date/Time   CALCIUM 8.6 (L) 06/02/2019 1138   ALKPHOS 79 06/02/2019 1138   AST 24 06/02/2019 1138   ALT 25 06/02/2019 1138   BILITOT 0.5 06/02/2019 1138      Results for Logan Brown, Logan Brown (MRN [id number]) as of 08/03/2019 09:42  Ref. Range 03/03/2019 12:55 03/22/2019 12:58 06/02/2019 11:38  Prostate Specific Ag, Serum Latest Ref Range: 0.0 - 4.0 ng/mL 4.9 (H) 7.1 (H) 28.3 (H)      Impression and Plan:   86 year old with the following:   1.    Advanced prostate cancer with disease to the bone since 2019.  He has currently castration-resistant disease.  He  is currently on Xtandi after clear progression of disease by PSA and radiographic criteria in March 2021.  Risks and benefits of continuing this therapy long-term were reviewed.  Potential complications that include hypertension, fatigue, edema and rarely seizure disorder were reviewed.  Alternative option would be systemic chemotherapy or Zytiga if he is intolerant to [place].  For the time being I recommended continuing Xtandi at the same dose and schedule.  If he has more episodes of diarrhea will decrease the dose to 40 mg daily.   2.  Androgen deprivation: He will receive Eligard today and repeated in 4 months.  Complications occluding hot flashes or weight  gain among others.  He is agreeable to proceed.  3.  Bone directed therapy: Risks and benefits of continuing Xgeva were reviewed.  Potential complications that include hypocalcemia and osteonecrosis of the jaw were reiterated.  He is agreeable to proceed.   4.  Prognosis and goals of care: His disease is incurable and any therapy is palliative at this time.  His prognosis is guarded given his other comorbid conditions and advanced age.  He understands that the current treatment can palliate his symptoms temporarily but eventually will require hospice enrollment.  5.  Back pain: Multifactorial related to his malignancy and predominantly related to arthritis prior to his cancer diagnosis.  He is been following at the pain clinic.  Pain is manageable at this time.  6.  Follow-up:  He will return in 2 months for repeat evaluation.  30  minutes were spent on this encounter.  The time was dedicated to updating his disease status, reviewing laboratory data, addressing complications related to his cancer and cancer therapy.     Zola Button, MD 5/26/20219:47 AM

## 2019-08-04 ENCOUNTER — Telehealth: Payer: Self-pay

## 2019-08-04 ENCOUNTER — Telehealth: Payer: Self-pay | Admitting: Oncology

## 2019-08-04 LAB — PROSTATE-SPECIFIC AG, SERUM (LABCORP): Prostate Specific Ag, Serum: 0.5 ng/mL (ref 0.0–4.0)

## 2019-08-04 NOTE — Telephone Encounter (Signed)
Called patient and let him know PSA result. Patient verbalized understanding.  

## 2019-08-04 NOTE — Telephone Encounter (Signed)
Called pt to scheduled appts per 5/26 los. Patient stated he was going to [facility] for a while and dont know when he is coming back, so no appts were scheduled per pt request   Informed MD by secure chat.

## 2019-08-04 NOTE — Telephone Encounter (Signed)
-----   Message from Wyatt Portela, MD sent at 08/04/2019  8:09 AM EDT ----- Please let him know his PSA is down

## 2019-08-11 ENCOUNTER — Telehealth: Payer: Self-pay | Admitting: *Deleted

## 2019-08-11 NOTE — Telephone Encounter (Signed)
called for pre visit info. LVM.

## 2019-08-12 ENCOUNTER — Telehealth: Payer: Self-pay | Admitting: *Deleted

## 2019-08-12 NOTE — Progress Notes (Signed)
Unsuccessful attempt to contact patient for Virtual Visit (Pain Management Telehealth)   Patient provided contact information:  [phone] (home); [phone] (mobile); (Preferred) [phone] No e-mail address on record   Pre-screening:  Our staff was unsuccessful in contacting Logan Brown using the above provided information.   I unsuccessfully attempted to make contact with Logan Brown. on 08/15/2019 via telephone. I was unable to complete the virtual encounter due to call going directly to voicemail. I was able to leave a message.  Pharmacotherapy Assessment  Analgesic: Oxycodone IR  5 mg, 1 tab PO BID (15 MME/day) MME/day: 15 mg/day.   Follow-up plan:   Reschedule Visit.     Interventional management options: Planned, scheduled, and/or pending:   Therapeutic/palliative bilateral L2 transforaminal ESI #3 under fluoroscopic guidance and IV sedation   Considering:   Diagnostic IV lidocaine infusion  Diagnostic bilateral lumbar facet NB Possible bilateral lumbar facetRFA   Palliative PRN treatment(s):   Palliative bilateral L2 TFESI #3    Recent Visits Date Type Provider Dept  05/18/19 Telemedicine [name], MD [facility]  Showing recent visits within past 90 days and meeting all other requirements   Today's Visits Date Type Provider Dept  08/15/19 Telemedicine [name], MD [facility]  Showing today's visits and meeting all other requirements   Future Appointments No visits were found meeting these conditions.  Showing future appointments within next 90 days and meeting all other requirements    Note by: Gaspar Cola, MD Date: 08/15/2019; Time: 12:50 PM

## 2019-08-12 NOTE — Telephone Encounter (Signed)
Attempted to call for pre appointment review of allergies/meds. Message left. 

## 2019-08-15 ENCOUNTER — Ambulatory Visit: Payer: Medicare Other | Attending: Pain Medicine | Admitting: Pain Medicine

## 2019-08-15 ENCOUNTER — Other Ambulatory Visit: Payer: Self-pay

## 2019-08-15 ENCOUNTER — Telehealth: Payer: Self-pay | Admitting: *Deleted

## 2019-08-15 DIAGNOSIS — G894 Chronic pain syndrome: Secondary | ICD-10-CM

## 2019-08-15 DIAGNOSIS — G893 Neoplasm related pain (acute) (chronic): Secondary | ICD-10-CM

## 2019-08-15 DIAGNOSIS — C7951 Secondary malignant neoplasm of bone: Secondary | ICD-10-CM

## 2019-08-15 NOTE — Telephone Encounter (Signed)
Attempted to call for pre appointment review of allergies/meds. Message left. 

## 2019-08-17 ENCOUNTER — Ambulatory Visit: Payer: Medicare Other | Admitting: Physician Assistant

## 2019-08-18 ENCOUNTER — Encounter: Payer: Medicare Other | Admitting: Pain Medicine

## 2019-08-18 NOTE — Progress Notes (Deleted)
Cancelled.  

## 2019-08-25 ENCOUNTER — Ambulatory Visit: Admitting: Urology

## 2019-08-25 NOTE — Progress Notes (Signed)
* * *      VOYD, GROFT **DOB:** [birth year] (86 yo M) **Acc No.** [id number] **DOS:**  08/25/2019    ---       **[name]**    ------    75 Y old Male, DOB: 27-Jul-1931    [address] [place], [place], [place] [address]    Home: [phone]    Provider: [name]        * * *    Telephone Encounter    ---    Answered by  [name] Date: 08/25/2019       Time: 08:00 AM    Reason  Check on pt    ------            Action Taken                     [name] 08/25/2019 08:00:49 AM >  not urgent but look at the last Tel encounter and see if we can get in touch with the pt or his daughter and see if he permanently now resides in [place]? If he is up here he needs a visit.[name] Kitchen thanks      Lowery,Aprile  08/26/2019 8:50:17 AM > lmom for daughter to call office and let us know if pt is back up here and if he needs to be seen      Morse,Linda  08/30/2019 1:17:35 PM > Patient's daughter scheduled appointment      Lowery,Aprile  08/31/2019 4:16:44 PM > Hi Dr. [name], FYI                    * * *                ---          * * *        Provider: [name] 08/25/2019    ---    Note generated by eClinicalWorks EMR/PM Software ([email or web address])

## 2019-08-29 ENCOUNTER — Telehealth: Payer: Self-pay | Admitting: Physician Assistant

## 2019-08-29 NOTE — Telephone Encounter (Signed)
Called patient 08/29/19 to schedule follow up visit from patients recall list with Barrett,PA. The patient didn't answer so I left message for patient to return call to get appt scheduled.

## 2019-08-30 ENCOUNTER — Ambulatory Visit

## 2019-08-30 LAB — HX CBC W/ DIFF
CASE NUMBER: 2021173004452
HX ABSOLUTE NRBC COUNT: 0 10*3/uL
HX HCT: 38.2 % — ABNORMAL LOW (ref 39.0–53.0)
HX HGB: 11.8 g/dL — ABNORMAL LOW (ref 13.0–17.5)
HX MCH: 34.5 pg — ABNORMAL HIGH (ref 26.0–34.0)
HX MCHC: 30.9 g/dL — ABNORMAL LOW (ref 31.0–37.0)
HX MCV: 111.7 fL — ABNORMAL HIGH (ref 80.0–100.0)
HX MPV: 11 fL — NL (ref 9.4–12.4)
HX NRBC PERCENT: 0 % — NL
HX PLATELET: 171 10*3/uL — NL (ref 150.0–400.0)
HX RBC: 3.42 10*6/uL — ABNORMAL LOW (ref 4.2–5.9)
HX RDW-CV: 14.8 % — ABNORMAL HIGH (ref 11.5–14.5)
HX RDW-SD: 61.6 fL — ABNORMAL HIGH (ref 35.0–51.0)
HX WBC: 5.6 10*3/uL — NL (ref 4.0–11.0)

## 2019-08-30 LAB — HX .AUTOMATED DIFF
CASE NUMBER: 2021173004452
HX ABSOLUTE BASO COUNT: 0.03 10*3/uL — NL (ref 0.0–0.22)
HX ABSOLUTE EOS COUNT: 0.14 10*3/uL — NL (ref 0.0–0.45)
HX ABSOLUTE LYMPHS COUNT: 0.74 10*3/uL — NL (ref 0.74–5.04)
HX ABSOLUTE MONO COUNT: 0.38 10*3/uL — NL (ref 0.0–1.34)
HX ABSOLUTE NEUTRO COUNT: 4.32 10*3/uL — NL (ref 1.48–7.95)
HX BASOPHILS: 0.5 %
HX EOSINOPHILS: 2.5 %
HX IMMATURE GRANULOCYTES: 0.4 % — NL (ref 0.0–2.0)
HX LYMPHOCYTES: 13.1 %
HX MONOCYTES: 6.7 %
HX NEUTROPHILS: 76.8 %

## 2019-08-30 LAB — HX SST GOLD TUBE TO HOLD: CASE NUMBER: 2021173004490

## 2019-08-31 LAB — HX GLOMERULAR FILTRATION RATE (ESTIMATED)
CASE NUMBER: 2021173004452
HX AFN AMER GLOMERULAR FILTRATION RATE: 43 mL/min/{1.73_m2}
HX NON-AFN AMER GLOMERULAR FILTRATION RATE: 38 mL/min/{1.73_m2}

## 2019-08-31 LAB — HX IRON PROFILE
CASE NUMBER: 2021173004452
HX % IRON BOUND: 41 % — NL (ref 10.0–50.0)
HX FERRITIN LVL: 208 ng/mL — NL (ref 24.0–336.0)
HX IRON: 105 ug/dL — NL (ref 40.0–175.0)
HX TIBC: 255 ug/dL — NL (ref 250.0–450.0)

## 2019-08-31 LAB — HX BASIC METABOLIC PANEL
CASE NUMBER: 2021173004452
HX ANION GAP: 10 — NL (ref 3.0–11.0)
HX BUN: 28 mg/dL — ABNORMAL HIGH (ref 8.0–23.0)
HX CALCIUM LVL: 9.6 mg/dL — NL (ref 8.5–10.5)
HX CHLORIDE: 108 mmol/L — NL (ref 98.0–110.0)
HX CO2: 23 mmol/L — NL (ref 21.0–32.0)
HX CREATININE: 1.63 mg/dL — ABNORMAL HIGH (ref 0.55–1.3)
HX GLUCOSE LVL: 151 mg/dL — ABNORMAL HIGH (ref 70.0–110.0)
HX POTASSIUM LVL: 4.2 mmol/L — NL (ref 3.6–5.2)
HX SODIUM LVL: 141 mmol/L — NL (ref 136.0–146.0)

## 2019-08-31 LAB — HX VITAMIN B12 LEVEL
CASE NUMBER: 2021173004452
HX VITAMIN B12 LVL: 1173 pg/mL — ABNORMAL HIGH (ref 193.0–986.0)

## 2019-08-31 LAB — HX NT-PROBNP
CASE NUMBER: 2021173004452
HX NT-PROBNP: 358 pg/mL — NL (ref 0.0–1800.0)

## 2019-08-31 LAB — HX VITAMIN D 25 HYDROXY LEVEL (RECOMMENDED)
CASE NUMBER: 2021173004452
HX VITAMIN D 25 OH LVL: 47 ng/mL — NL (ref 30.0–100.0)

## 2019-08-31 LAB — HX TSH REFLEX PANEL (RECOMMENDED)
CASE NUMBER: 2021173004452
HX 3RD GEN TSH: 2.47 u[IU]/mL — NL (ref 0.358–3.74)

## 2019-09-13 ENCOUNTER — Ambulatory Visit

## 2019-09-13 ENCOUNTER — Ambulatory Visit: Admitting: Urology

## 2019-09-13 NOTE — Progress Notes (Signed)
 * * *    [name], [name] **DOB:** 05/23/1931 (86 yo M) **Acc No.** [id number] **DOS:**  09/13/2019    ---       **[name]**    ------    68 Y old Male, DOB: 21-Apr-1931    Account Number: [id number]    [address] [place], [place], [place]    Home: [phone]    Guarantor: [name], [name] Insurance: A[employer] Medicare Complete Payer ID:  [id number]    PCP: [name]    Appointment Facility: [facility]        * * *    09/13/2019 Progress Notes: [name], [name]    ------    ---       **Current Medications**    ---    Taking    * Vitamin B12     ---    * Furosemide 40 MG Tablet 1 tablet Orally Once a day    ---    * Losartan Potassium 100 MG Tablet 1 tablet Orally Once a day    ---    * Omeprazole 20 MG Capsule Delayed Release 1 capsule Orally Once a day    ---    * Gabapentin 300 MG Capsule 1 capsule Orally Once a day    ---    * Aspirin 81 MG Tablet Delayed Release 1 tablet Orally Once a day    ---    * Calcium     ---    Not-Taking/PRN    * Megace Oral     ---    * Acetaminophen 500 MG Capsule 1 capsule as needed Orally every 6 hrs    ---    * Lisinopril 5 MG Tablet 1 tablet Orally Once a day    ---    * Metoprolol Succinate ER 25 MG Tablet Extended Release 24 Hour 1 tablet Orally Once a day    ---    * MiraLax - Powder as directed Orally     ---    * oxyCODONE HCl 5 MG/5ML Solution 5 ml as needed Orally every 6 hrs    ---    * Milk of Magnesia 400 MG/5ML Suspension 5 ml as needed Orally Four times a day    ---    * Bisacodyl 10 MG Suppository 1 suppository as needed Rectal Once a day    ---    * Casodex 50 MG Tablet 1 tablet Orally Once a day    ---    * Centrum Silver Tablet as directed Orally     ---    * hydroCHLOROthiazide 25 MG Tablet 1 tablet Orally Once a day    ---    * Xanax 0.25 MG Tablet 1 tablet Orally Twice a day    ---    * HYDROcodone-Acetaminophen 5-325 MG Tablet 1 tablet as needed Orally every 6 hrs    ---    * Amiodarone HCl 200 MG Tablet  1 tablet Orally Once a day    ---    * Zestril 5 MG Tablet 1 tablet Orally Once a day    ---    * Zocor 10 MG Tablet 1 tablet every evening Orally Once a day    ---    * Xarelto 15 MG Tablet Orally     ---    Medication List reviewed and reconciled with the patient    ---     Past Medical History    ---  prostate cancer: Dx'd 7/06; S/P XRT: 11/06-1/07: gleason's 8 and then Dx'd  with met prostate cancer in 07/2017: Lupron on/off.        ---    GERD.        ---    Hypercholesterolemia.        ---    Hypertension.        ---    Anxiety.        ---    Atrial Fibrillation: @ 2017.        ---    ED - Impotence of organic origin.        ---    Renal insufficiency.        ---    COVID Vacc: Pfizer 05/2019.        ---      **Surgical History**    ---      Trans Rectal [place] and Prostate BX 09/2004    ---    cholecystectomy 04/2004    ---    inguinal hernia repair    ---    cystoscopy: c/w rad changes 08/27/2016    ---    cardioversion    ---     **Family History**    ---      No Family History documented.    ---      **Social History**    ---    Alcohol: yes, occasional.    no Smoking status: never smoker, Patient counseled on the dangers of tobacco  use: 09/13/2019.    no Caffeine.     **Allergies**    ---      N.K.D.A.    ---      **Hospitalization/Major Diagnostic Procedure**    ---      fall/compressionfx c/we met disease 5/11-5/15/19    ---      **Review of Systems**    ---     _General_ :    no Fever. no Chills. no Weakness. no Abdominal pain.    _Pulmonary_ :    no shortness of breath.    _Gastroenterology_ :    no Constipation. no Diarrhea.    _Urology_ :    no Dysuria. no Blood in urine. no Frequent urination. no Urinary incontinence.          **Reason for Appointment**    ---      1\. Prostate cancer follow up    ---      **History of Present Illness**    ---     _Urological History_ :    pt here for the frist time in over a year: pt is voiding well, no issues: ? on  xtandi pt not sure: diarrhea: ?  April for last Lupron: pt and daughter are not  sure about a lot of things: some hot flashes and not on megace: can manage.      **Vital Signs**    ---    Ht **5'7"** , Wt **182** , BMI **28.50** , BP **136/90** , Temp **98.7**.      **Examination**    ---     _General:_    Lungs/Respirations: No labored breathing , good excursion. Muscular normal  gait.    _Genitourinary - Male:_    General appearance: alert, oriented , build: average , no apparent distress ,  pleasant. Abdomen: normal , non-distended , soft, non-tender, no mass , no  cvat. Digital Rectal Exam (DRE): deferred. Epididymis: normal , non-tender ,  bilateral. Penis phimosis: complete. Scrotum: normal ,  no rashes, no lesions.  Testicles: normal bilaterally. Urethral meatus: unable to see due to phimosis.  HEENT: Sclera: anicteric. Lymphatic: no adenopathy.    mild mid lower back tenderness.          **Assessments**    ---    1\. Prostate cancer - C61 (Primary)    ---    2\. Phimosis - N47.1    ---      **Treatment**    ---      **1\. Prostate cancer**    _IMAGING: [facility] Whole Body e-sch*_   hx of metastatic prostate  cancer    ------        Notes: His PSA remains very low but unfortunately, I do not have the exact  date of his last Lupron and therefore from insurance reasons, I cannot give  him one today. However, I do want to get a bone scan on him as well as his  recent oncology notes from [place] to see if he was on Xtandi on any other second  antiandrogens since he is not sure and neither is the daughter. will also re-  refer to med onoclogy ( Dr. Kem Kays).        **2\. Phimosis**    Notes: He has a complete phimosis and I went over the reasons/risks/benefits  of doing a circumcision and he agrees: will set up.        **3\. Others**    Notes: Finally, he was here with his daughter today and they are unsure if and  when he will ever go back to [facility]. I reminded them both that I need their help  in getting records, keeping dates, and lists  of meds so that I can treat him  properly : i.e give him the Lupron appropriately etc.).. Will sign for his  records.      **Labs**    ------    _Lab: ClinitekStatus SG_    ---       Value Reference Range    ---------    Color yellow/clear   \-    GLU Negative   \-    ------------    BIL Negative   \-    ------------    KET Negative   \-    ------------    SG 1.020   \-    ------------    BLO Negative   \-    ------------    pH 7.0   \-    ------------    PRO Negative   \-    ------------    URO 1.0 E.U./dL   \-    ------------    NIT Negative   \-    ------------    LEU Negative   \-    ------------       Ruiz,Julia 09/13/2019 3:49:51 PM > Laelyn Blumenthal E 09/13/2019 3:55:23 PM >    ------    _Lab: PSA, total_ 0.04 ng/ml       Value Reference Range    ---------    level 0.04 ng/ml        Ruiz,Julia 09/13/2019 4:09:20 PM > Naomi Fitton E 09/13/2019 4:10:22 PM >    ------      **Procedure Codes**    ---      81003 UA Automated    ---    44010 PSA    ---      **Follow Up**    ---    schedule for surgery  Electronically signed by [name] , [name] on 09/13/2019 at 06:15 PM EDT    Sign off status: Completed        * * [facility]    [address]    [place], [place] [id number]    Tel: [phone]    Fax: [phone]              * * *          Progress Note: [name], [name] 09/13/2019    ---    Note generated by eClinicalWorks EMR/PM Software ([email or web address])

## 2019-09-13 NOTE — Progress Notes (Signed)
* * *      [name], [name] **DOB:** 06-Feb-1932 (86 yo M) **Acc No.** [id number] **DOS:**  09/13/2019    ---       **[name]**    ------    13 Y old Male, DOB: May 23, 1931    [address] [place], [place], [place] [address]    Home: [phone]    Provider: [name]        * * *    Telephone Encounter    ---    Answered by  Alfonso Patten Date: 09/13/2019       Time: 04:17 PM    Reason  Lupron injection    ------            Message                     called [facility] and left a message inquiring about pt's last lupron injection                Action Taken                     [name],[name]  09/13/2019 4:18:26 PM >      Morse,Linda  09/13/2019 4:31:48 PM > I faxed over medical release form      [name],[name]  09/14/2019 8:19:28 AM > notes received                    * * *                ---          * * *        Provider: [name] 09/13/2019    ---    Note generated by eClinicalWorks EMR/PM Software ([email or web address])

## 2019-09-15 ENCOUNTER — Ambulatory Visit: Admitting: Urology

## 2019-09-15 NOTE — Progress Notes (Signed)
* * *      [name], [name] **DOB:** 06-18-1931 (86 yo M) **Acc No.** [id number] **DOS:**  09/15/2019    ---       **[name]**    ------    18 Y old Male, DOB: 1931-05-15    [address] [place], [place], [place] [address]    Home: [phone]    Provider: [name]        * * *    Telephone Encounter    ---    Answered by  [name] Date: 09/15/2019       Time: 07:53 AM    Reason  xtandi rx sent    ------            Action Taken                     [name] 09/15/2019 07:53:30 AM > please see my note and addendum from the other day: We need to contact the patients daughter and let her know his last hormone shot was on 08/03/19 and it was a 4 month depot and thus he is not due for another Lupron until after 12/04/19 so please set up another appt  just after that date with me. Secondly , he was on 80 mg of Xtandi but there was a ? of him dropping to 40 mg due to diarrhea and if so lets at least put him on 40 mg of Xtandi for now.. thanks      DaSilva,Brittany  09/15/2019 9:07:12 AM > spoke to daughter- appt booked for OVL- he was previsouly taking the 80mg  BID and cut down to once daily- usually with xtandi it is 80mg  BID or 40mg  QID- please advise- do you want me to just send over the 80mg  BID and he can continue taking it as he is now? ty      [name] 09/15/2019 11:36:59 AM > yes that is fine, thanks      DaSilva,Brittany  09/15/2019 12:11:42 PM > xtandi sent to rx. sent this to [facility] because it was the specialty pharmacy the insurance company reccomeended. made daughter aware.                 Refills Start Xtandi Tablet, 80 MG, Orally, 60 Tablet, 1 tablet, twice a day,  30 day(s), Refills=5    ------          * * *                ---          * * *        Provider: [name] 09/15/2019    ---    Note generated by eClinicalWorks EMR/PM Software ([email or web address])

## 2019-09-21 ENCOUNTER — Ambulatory Visit: Admitting: Adult Health

## 2019-09-21 NOTE — Other (Deleted)
 Patient:   [name], [name]            MRN: [id number]            FIN: [id number]               Age:   86 years     Sex:  Male     DOB:  06/05/1931   Associated Diagnoses:   None   Author:   Dayton Scrape NP, Osborne Casco        Chief Complaint: prostate cancer    Oncologic/hematologic History:  He was recently admitted to the hospital s/p fall down the stairs, resulting in a back injury.  Extensive work-up including CT head, CT spine and MRI spine was done.  It revealed evidence of metastatic disease to the back and a mild compression fracture of L2.  There was no evidence of spinal cord compression.  PSA was checked and it returned high at 245.  He was treated conservatively for his fracture and was started on pain meds.  He was then started on Lupron and Casodex, as well as Xgeva. Casodex was then discontinued and he remained on the other two. For his bony met in the back, he underwent kyphoplasty. Repeat PSAs have been low.      Interval History: [name] is accompanied by his daughter today. He complains of ongoing lower back pain. He states that he had been going to a pain clinic in [place] for treatments. He used to be on Fentanyl and oxycodone but those medications were stopped. He is now living in [place] with his daughter but is planning to find a place to live locally. He recently had an appointment with Dr. Roselee [name]. He states that his [name] was decreased due to having diarrhea from the 80 mg dose. He reports his diarrhea is now resolved. He denies any blood in the stool or urine. He denies abdominal pain. He reports his appetite has been decreased and he has lost weight. He denies nausea or vomiting.     He gets winded easily when walking. He denies shortness of breath at rest. He denies cough, chest pain, palpitations, fevers or chills. He has a follow up with [name], his NP, next week to discuss his medications.     ROS: He takes a water pill daily. He denies leg swelling. He has mild numbness and  tingling on the bottoms of his feet particularly when sitting for long periods of time. He denies falls.     Allergies:  Allergies (1) Active Reaction  NKA None Documented  ALLERGIES         Home Meds:   Medication List     Active Medications         Prescribed             potassium phosphate-sodium phosphate: See Instructions, 1 tab twice a               day for 3 days, 6 tab(s), 0 Refill(s).         Documented             acetaminophen: 650 mg, 2 tab(s), PO, q4hr, PRN: Fever/Pain, Mild to               Moderate, 0 Refill(s).             Al hydroxide/Mg hydroxide/simethicone: 30 mL, PO, q4hr, PRN:               Dyspepsia,  0 Refill(s).             ALPRAZolam: See Instructions, 0.25 mg PO TID PRN, PRN: as needed for               anxiety, 0 Refill(s).             aspirin: 81 mg, Chewed, Daily, 0 Refill(s).             calcium carbonate: 600 mg, 1 tab(s), PO, Daily, 0 Refill(s).             cholecalciferol: 1,000 Int_Unit, PO, Daily, 0 Refill(s).             codeine-guaifenesin: 10 mL, PO, q4hr, 10ml twice a day as needed, 0               Refill(s).             docusate: 100 mg, 1 cap(s), PO, BID, PRN: Constipation, 0 Refill(s).             fentaNYL: 25 mcg, q72hr, change q 3 days fentayl 25 mgPartial fill               upon request, 0 Refill(s).             gabapentin: 300 mg, 1 cap(s), PO, BID, 0 Refill(s).             lisinopril: 5 mg, 1 tab(s), PO, Daily, 0 Refill(s).             loperamide: 2 mg, 1 tab(s), PO, q6hr, as needed, 0 Refill(s).             omeprazole: 20 mg, PO, Daily, 0 Refill(s).             ondansetron: See Instructions, 1 tab(s) PO q8hr PRN, 0 Refill(s).             oxyCODONE: 5 mg, PO, as needed, 0 Refill(s).             polyethylene glycol 3350: 17 Gm, PO, Daily, PRN: Constipation, 0               Refill(s).     Medications Inactivated in the Last 72 Hours         No medications found.      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     The patient presents today for a follow up visit. He has continued on Lupron and Xtandi, which we previously started while living in [place]. He is here to continue care. He is following with Dr. Roselee [name] here as well, and receives Lupron there. Recent PSA level was normal. He states he was initially started on 80 mg of Xtandi, and this was recently decreased due to GI issues. He is tolerating the lower dose well. Recent labs appear to be stable, no transaminitis noted. Creatinine is stable.     For his lower back pain, I suspect this is related to metastatic bone disease. He was receiving pain management locally in [place]. Staging scans with CT and bone scan were advised at his last follow up with Dr. Kem [name], however, these were cancelled secondary to the cost. We discussed the importance of  obtaining imaging to help guide his treatment and that we may be able to give palliative radiation if there is localized disease.     Imaging thus far as well as the high PSA which indicates the diagnosis of stage IV prostate cancer.  He was recommended to have staging scans, CT chest/abdomen/pelvis and bone scan but he canceled these secondary to the cost.  He was started on bicalutamide and then received his first dose of Lupron as few days later.      He was previously receiving Xgeva in our clinic, last administered July 2020. Unclear if he continued this while in [place]. I would advise continuing the Xgeva here. He should take calcium and vitamin D daily as well.     SUMMARY OF PLAN:  Continue on Xtandi. Will need to confirm dose with GU.  Continue Lupron every 3 months with Dr. Roselee [name].  Schedule CT c/a/p and bone scan for staging  Consider pain clinic referral for low back pain. May benefit  from palliative XRT if there is targetable disease.  Resume Xgeva. Will confirm with [place] oncology [place] if he was receiving this there.  Follow up again in 3-4 weeks after imaging studies are complete.    The patient expressed agreement and understanding of the plan as discussed above. The patient knows to call with any issues or concern in the interim.     Edmund Hilda, NP                      Reviewed and electronically verified by:  Dayton Scrape NPOsborne Casco                                                                       on:  09/21/2019 16:37    Reviewed and electronically authenticated by:  Edmund Hilda                                                                         on:  09/21/19 16:37

## 2019-09-21 NOTE — Other (Deleted)
 [email or web address] Nurse[facility] Intake Entered On:  09/21/2019 13:09     Performed On:  09/21/2019 13:09  by [name]               Vital Signs   Numeric Pain Scale Score :   0    Peripheral Pulse Rate :   69 bpm   Respiratory Rate :   16 br/min   Systolic Blood Pressure :   136 mmHg   Diastolic Blood Pressure :   75 mmHg   Blood Pressure Extremity :   Left arm   Mean Arterial Pressure :   95 mmHg   SpO2 :   97 %   Oxygen Therapy 1 :   Room air   [name] - 09/21/2019 13:26    Pain Scale Used :   0-10   [name] - 09/21/2019 13:09    Height/Weight   Weight :   82.54 Kg(Converted to: 182 lb 0 oz)    Type of Weight :   Standing   Type of Height :   Stated height   [name] - 09/21/2019 13:51    [facility] General Information/Subjective   Chief Complaint :   follow up   High Risk for Falls :   No   Preferred Language :   English   Preferred Communication Mode :   Verbal   Pain Symptoms :   No   [name] - 09/21/2019 13:51    Patient Preferred Method of Communication   Phone Call   [facility] Infectious Disease Risk Screening   Patient Travel History :   No recent travel   Recent Family Travel History :   No recent travel   Close contact w/ suspect case COVID-19 :   No   Close contact w/ lab-confirmed case COVID-19 :   No   Lab confirmed positive COVID-19 test result? :   No   Recently exposed to COVID-19? :   No   [name] - 09/21/2019 13:51    Allergy   (As Of: 09/21/2019 13:53:37 )   Allergies (Active)   NKA  Estimated Onset Date:   Unspecified ; Created By:   Ronnie Doss; Reaction Status:   Active ; Category:   Drug ; Substance:   NKA ; Type:   Allergy ; Updated By:   Ronnie Doss; Reviewed Date:   09/21/2019 13:51         Medication History   Medication List   (As Of: 09/21/2019 13:53:37 )   Prescription/Discharge Order    potassium phosphate-sodium phosphate  :   potassium phosphate-sodium phosphate ; Status:   Completed ; Ordered As Mnemonic:   Phospha 250 Neutral oral tablet ; Simple Display  Line:   See Instructions, 1 tab twice a day for 3 days, 6 tab(s), 0 Refill(s) ; Ordering Provider:   Lorraine Lax NP, [place] Citizen; Catalog Code:   potassium phosphate-sodium phosphate ; Order Dt/Tm:   12/09/2017 16:32:48             Home Meds    enzalutamide  :   enzalutamide ; Status:   Documented ; Ordered As Mnemonic:   Xtandi 40 mg oral tablet ; Simple Display Line:   80 mg, 2 tab(s), PO, Daily, 0 Refill(s) ; Catalog Code:   enzalutamide ; Order Dt/Tm:   09/21/2019 13:53:12           furosemide  :   furosemide ; Status:   Documented ; Ordered As Mnemonic:  furosemide 40 mg oral tablet ; Simple Display Line:   0 Refill(s) ; Catalog Code:   furosemide ; Order Dt/Tm:   09/21/2019 13:53:19           losartan  :   losartan ; Status:   Documented ; Ordered As Mnemonic:   losartan 50 mg oral tablet ; Simple Display Line:   0 Refill(s) ; Catalog Code:   losartan ; Order Dt/Tm:   09/21/2019 13:53:19           calcium carbonate  :   calcium carbonate ; Status:   Documented ; Ordered As Mnemonic:   calcium (as carbonate) 600 mg oral tablet ; Simple Display Line:   600 mg, 1 tab(s), PO, Daily, 0 Refill(s) ; Catalog Code:   calcium carbonate ; Order Dt/Tm:   10/02/2017 17:18:41           oxyCODONE  :   oxyCODONE ; Status:   Documented ; Ordered As Mnemonic:   oxyCODONE ; Simple Display Line:   5 mg, PO, as needed, 0 Refill(s) ; Catalog Code:   oxyCODONE ; Order Dt/Tm:   09/29/2017 15:24:05           cholecalciferol  :   cholecalciferol ; Status:   Discontinued ; Ordered As Mnemonic:   Vitamin D3 ; Simple Display Line:   1,000 Int_Unit, PO, Daily, 0 Refill(s) ; Catalog Code:   cholecalciferol ; Order Dt/Tm:   09/04/2017 14:19:46           codeine-guaifenesin  :   codeine-guaifenesin ; Status:   Discontinued ; Ordered As Mnemonic:   Robitussin AC ; Simple Display Line:   10 mL, PO, q4hr, 10ml twice a day as needed, 0 Refill(s) ; Catalog Code:   codeine-guaifenesin ; Order Dt/Tm:   08/14/2017 11:47:36           polyethylene glycol 3350   :   polyethylene glycol 3350 ; Status:   Discontinued ; Ordered As Mnemonic:   MiraLax oral powder for reconstitution ; Simple Display Line:   17 Gm, PO, Daily, PRN: Constipation, 0 Refill(s) ; Catalog Code:   polyethylene glycol 3350 ; Order Dt/Tm:   08/14/2017 11:47:11           ondansetron  :   ondansetron ; Status:   Discontinued ; Ordered As Mnemonic:   Zofran 4 mg oral tablet ; Simple Display Line:   See Instructions, 1 tab(s) PO q8hr PRN, 0 Refill(s) ; Catalog Code:   ondansetron ; Order Dt/Tm:   08/14/2017 11:46:16           omeprazole  :   omeprazole ; Status:   Discontinued ; Ordered As Mnemonic:   omeprazole ; Simple Display Line:   20 mg, PO, Daily, 0 Refill(s) ; Catalog Code:   omeprazole ; Order Dt/Tm:   08/14/2017 11:45:57           fentaNYL  :   fentaNYL ; Status:   Discontinued ; Ordered As Mnemonic:   fentaNYL ; Simple Display Line:   25 mcg, q72hr, change q 3 days fentayl 25 mgPartial fill upon request, 0 Refill(s) ; Catalog Code:   fentaNYL ; Order Dt/Tm:   08/14/2017 11:39:32           loperamide  :   loperamide ; Status:   Discontinued ; Ordered As Mnemonic:   loperamide 2 mg oral tablet ; Simple Display Line:   2 mg, 1 tab(s), PO, q6hr, as needed, 0 Refill(s) ;  Catalog Code:   loperamide ; Order Dt/Tm:   08/14/2017 11:38:26           acetaminophen  :   acetaminophen ; Status:   Discontinued ; Ordered As Mnemonic:   acetaminophen 325 mg oral tablet ; Simple Display Line:   650 mg, 2 tab(s), PO, q4hr, PRN: Fever/Pain, Mild to Moderate, 0 Refill(s) ; Ordering Provider:   Susette Racer MD, Collene Leyden; Catalog Code:   acetaminophen ; Order Dt/Tm:   07/22/2017 08:43:08           Al hydroxide/Mg hydroxide/simethicone  :   Al hydroxide/Mg hydroxide/simethicone ; Status:   Discontinued ; Ordered As Mnemonic:   aluminum hydroxide/magnesium hydroxide/simethicone 200 mg-200 mg-20 mg/5 mL oral suspension ; Simple Display Line:   30 mL, PO, q4hr, PRN: Dyspepsia, 0 Refill(s) ; Ordering Provider:   Susette Racer MD, Collene Leyden; Catalog Code:   Al hydroxide/Mg hydroxide/simethicone ; Order Dt/Tm:   07/22/2017 08:43:18           docusate  :   docusate ; Status:   Discontinued ; Ordered As Mnemonic:   docusate sodium 100 mg oral capsule ; Simple Display Line:   100 mg, 1 cap(s), PO, BID, PRN: Constipation, 0 Refill(s) ; Ordering Provider:   Susette Racer MD, Collene Leyden; Catalog Code:   docusate ; Order Dt/Tm:   07/22/2017 08:43:25           lisinopril  :   lisinopril ; Status:   Discontinued ; Ordered As Mnemonic:   lisinopril 5 mg oral tablet ; Simple Display Line:   5 mg, 1 tab(s), PO, Daily, 0 Refill(s) ; Ordering Provider:   Susette Racer MD, Collene Leyden; Catalog Code:   lisinopril ; Order Dt/Tm:   07/22/2017 08:43:40           aspirin  :   aspirin ; Status:   Documented ; Ordered As Mnemonic:   aspirin ; Simple Display Line:   81 mg, Chewed, Daily, 0 Refill(s) ; Catalog Code:   aspirin ; Order Dt/Tm:   07/18/2017 08:40:53           gabapentin  :   gabapentin ; Status:   Documented ; Ordered As Mnemonic:   gabapentin 300 mg oral capsule ; Simple Display Line:   300 mg, 1 cap(s), PO, BID, 0 Refill(s) ; Catalog Code:   gabapentin ; Order Dt/Tm:   07/12/2017 18:01:00           ALPRAZolam  :   ALPRAZolam ; Status:   Discontinued ; Ordered As Mnemonic:   Xanax ; Simple Display Line:   See Instructions, 0.25 mg PO TID PRN, PRN: as needed for anxiety, 0 Refill(s) ; Catalog Code:   ALPRAZolam ; Order Dt/Tm:   09/07/2015 07:07:23             [facility] Social History   Cigarette Smoking Last 365 Days :   No   Exposure to Tobacco Smoke :   Former smoker   Patient used other tobacco products in the last 30 days? :   No   [name] - 09/21/2019 13:51    Social History   (As Of: 09/21/2019 13:53:37 )   Tobacco:        Former smoker   (Last Updated: 10/19/2015 07:51:16  by Konrad Penta RN, Johnny Bridge)          Alcohol:        None   (Last Updated: 10/19/2015 07:51:23  by Konrad Penta RN, Johnny Bridge)  Substance Use:        Never   (Last Updated: 10/19/2015 07:51:50  by Konrad Penta RN,  Johnny Bridge)          Home/Environment:        Lives with Children.   Comments:  08/14/2017 11:49 - Margaretha Glassing RN, [name]: lives with daughter and brother has 7 children   (Last Updated: 08/14/2017 11:49:19  by Margaretha Glassing RN, [name])          Nutrition:        Regular   (Last Updated: 08/14/2017 11:41:02  by Margaretha Glassing RN, [name])          Employment/School:        Retired   (Last Updated: 08/14/2017 11:40:49  by Margaretha Glassing RN, [name])            Advance Directive   Advanced Directives :   No   [name] - 09/21/2019 13:51    [facility] Encounter   Onc Type of Patient :   Outpatient Visit Existing   Onc Visit Level, Existing :   Level 1   [name] - 09/21/2019 13:51

## 2019-09-21 NOTE — Other (Addendum)
 Patient:   [name], [name]            MRN: [id number]            FIN: [id number]               Age:   86 years     Sex:  Male     DOB:  06/05/1931   Associated Diagnoses:   None   Author:   Dayton Scrape NP, Osborne Casco        Chief Complaint: prostate cancer    Oncologic/hematologic History:  He was recently admitted to the hospital s/p fall down the stairs, resulting in a back injury.  Extensive work-up including CT head, CT spine and MRI spine was done.  It revealed evidence of metastatic disease to the back and a mild compression fracture of L2.  There was no evidence of spinal cord compression.  PSA was checked and it returned high at 245.  He was treated conservatively for his fracture and was started on pain meds.  He was then started on Lupron and Casodex, as well as Xgeva. Casodex was then discontinued and he remained on the other two. For his bony met in the back, he underwent kyphoplasty. Repeat PSAs have been low.      Interval History: [name] is accompanied by his daughter today. He complains of ongoing lower back pain. He states that he had been going to a pain clinic in [place] for treatments. He used to be on Fentanyl and oxycodone but those medications were stopped. He is now living in [place] with his daughter but is planning to find a place to live locally. He recently had an appointment with Dr. [name]. He states that his [name] was decreased due to having diarrhea from the 80 mg dose. He reports his diarrhea is now resolved. He denies any blood in the stool or urine. He denies abdominal pain. He reports his appetite has been decreased and he has lost weight. He denies nausea or vomiting.     He gets winded easily when walking. He denies shortness of breath at rest. He denies cough, chest pain, palpitations, fevers or chills. He has a follow up with Marylene Land, his NP, next week to discuss his medications.     ROS: He takes a water pill daily. He denies leg swelling. He has mild numbness and  tingling on the bottoms of his feet particularly when sitting for long periods of time. He denies falls.     Allergies:  Allergies (1) Active Reaction  NKA None Documented  ALLERGIES         Home Meds:   Medication List     Active Medications         Prescribed             potassium phosphate-sodium phosphate: See Instructions, 1 tab twice a               day for 3 days, 6 tab(s), 0 Refill(s).         Documented             acetaminophen: 650 mg, 2 tab(s), PO, q4hr, PRN: Fever/Pain, Mild to               Moderate, 0 Refill(s).             Al hydroxide/Mg hydroxide/simethicone: 30 mL, PO, q4hr, PRN:               Dyspepsia,  0 Refill(s).             ALPRAZolam: See Instructions, 0.25 mg PO TID PRN, PRN: as needed for               anxiety, 0 Refill(s).             aspirin: 81 mg, Chewed, Daily, 0 Refill(s).             calcium carbonate: 600 mg, 1 tab(s), PO, Daily, 0 Refill(s).             cholecalciferol: 1,000 Int_Unit, PO, Daily, 0 Refill(s).             codeine-guaifenesin: 10 mL, PO, q4hr, 10ml twice a day as needed, 0               Refill(s).             docusate: 100 mg, 1 cap(s), PO, BID, PRN: Constipation, 0 Refill(s).             fentaNYL: 25 mcg, q72hr, change q 3 days fentayl 25 mgPartial fill               upon request, 0 Refill(s).             gabapentin: 300 mg, 1 cap(s), PO, BID, 0 Refill(s).             lisinopril: 5 mg, 1 tab(s), PO, Daily, 0 Refill(s).             loperamide: 2 mg, 1 tab(s), PO, q6hr, as needed, 0 Refill(s).             omeprazole: 20 mg, PO, Daily, 0 Refill(s).             ondansetron: See Instructions, 1 tab(s) PO q8hr PRN, 0 Refill(s).             oxyCODONE: 5 mg, PO, as needed, 0 Refill(s).             polyethylene glycol 3350: 17 Gm, PO, Daily, PRN: Constipation, 0               Refill(s).     Medications Inactivated in the Last 72 Hours         No medications found.      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     The patient presents today for a follow up visit. He has continued on Lupron and Xtandi, which we previously started while living in [place]. He is here to continue care. He is following with Dr. [name] here as well, and receives Lupron there. Recent PSA level was normal. He states he was initially started on 80 mg of Xtandi, and this was recently decreased due to GI issues. He is tolerating the lower dose well. Recent labs appear to be stable, no transaminitis noted. Creatinine is stable.     For his lower back pain, I suspect this is related to metastatic bone disease. He was receiving pain management locally in [place]. Staging scans with CT and bone scan were advised at his last follow up with Dr. Kem Kays, however, these were cancelled secondary to the cost. We discussed the importance of  obtaining imaging to help guide his treatment and that we may be able to give palliative radiation if there is localized disease.     Imaging thus far as well as the high PSA which indicates the diagnosis of stage IV prostate cancer.  He was recommended to have staging scans, CT chest/abdomen/pelvis and bone scan but he canceled these secondary to the cost.  He was started on bicalutamide and then received his first dose of Lupron as few days later.      He was previously receiving Xgeva in our clinic, last administered July 2020. Unclear if he continued this while in [place]. I would advise continuing the Xgeva here. He should take calcium and vitamin D daily as well.     SUMMARY OF PLAN:  Continue on Xtandi. Will need to confirm dose with GU.  Continue Lupron every 3 months with Dr. [name].  Schedule CT c/a/p and bone scan for staging  Consider pain clinic referral for low back pain. May benefit  from palliative XRT if there is targetable disease.  Resume Xgeva. Will confirm with [place] oncology office if he was receiving this there.  Follow up again in 3-4 weeks after imaging studies are complete.    The patient expressed agreement and understanding of the plan as discussed above. The patient knows to call with any issues or concern in the interim.     Edmund Hilda, NP                      Reviewed and electronically verified by:  Dayton Scrape NPOsborne Casco                                                                       on:  09/21/2019 16:37    Reviewed and electronically authenticated by:  Edmund Hilda                                                                         on:  09/21/19 16:37

## 2019-09-21 NOTE — Other (Addendum)
 [facility] Intake Entered On:  09/21/2019 13:09     Performed On:  09/21/2019 13:09  by Alfred Levins               Vital Signs   Numeric Pain Scale Score :   0    Peripheral Pulse Rate :   69 bpm   Respiratory Rate :   16 br/min   Systolic Blood Pressure :   136 mmHg   Diastolic Blood Pressure :   75 mmHg   Blood Pressure Extremity :   Left arm   Mean Arterial Pressure :   95 mmHg   SpO2 :   97 %   Oxygen Therapy 1 :   Room air   Alfred Levins - 09/21/2019 13:26    Pain Scale Used :   0-10   Alfred Levins - 09/21/2019 13:09    Height/Weight   Weight :   82.54 Kg(Converted to: 182 lb 0 oz)    Type of Weight :   Standing   Type of Height :   Stated height   Alfred Levins - 09/21/2019 13:51    [facility] General Information/Subjective   Chief Complaint :   follow up   High Risk for Falls :   No   Preferred Language :   English   Preferred Communication Mode :   Verbal   Pain Symptoms :   No   Alfred Levins - 09/21/2019 13:51    Patient Preferred Method of Communication   Phone Call   [facility] Infectious Disease Risk Screening   Patient Travel History :   No recent travel   Recent Family Travel History :   No recent travel   Close contact w/ suspect case COVID-19 :   No   Close contact w/ lab-confirmed case COVID-19 :   No   Lab confirmed positive COVID-19 test result? :   No   Recently exposed to COVID-19? :   No   Alfred Levins - 09/21/2019 13:51    Allergy   (As Of: 09/21/2019 13:53:37 )   Allergies (Active)   NKA  Estimated Onset Date:   Unspecified ; Created By:   Ronnie Doss; Reaction Status:   Active ; Category:   Drug ; Substance:   NKA ; Type:   Allergy ; Updated By:   Ronnie Doss; Reviewed Date:   09/21/2019 13:51         Medication History   Medication List   (As Of: 09/21/2019 13:53:37 )   Prescription/Discharge Order    potassium phosphate-sodium phosphate  :   potassium phosphate-sodium phosphate ; Status:   Completed ; Ordered As Mnemonic:   Phospha 250 Neutral oral tablet ; Simple Display Line:    See Instructions, 1 tab twice a day for 3 days, 6 tab(s), 0 Refill(s) ; Ordering Provider:   Lorraine Lax NP, Rayfield Citizen; Catalog Code:   potassium phosphate-sodium phosphate ; Order Dt/Tm:   12/09/2017 16:32:48             Home Meds    enzalutamide  :   enzalutamide ; Status:   Documented ; Ordered As Mnemonic:   Xtandi 40 mg oral tablet ; Simple Display Line:   80 mg, 2 tab(s), PO, Daily, 0 Refill(s) ; Catalog Code:   enzalutamide ; Order Dt/Tm:   09/21/2019 13:53:12           furosemide  :   furosemide ; Status:   Documented ; Ordered As Mnemonic:  furosemide 40 mg oral tablet ; Simple Display Line:   0 Refill(s) ; Catalog Code:   furosemide ; Order Dt/Tm:   09/21/2019 13:53:19           losartan  :   losartan ; Status:   Documented ; Ordered As Mnemonic:   losartan 50 mg oral tablet ; Simple Display Line:   0 Refill(s) ; Catalog Code:   losartan ; Order Dt/Tm:   09/21/2019 13:53:19           calcium carbonate  :   calcium carbonate ; Status:   Documented ; Ordered As Mnemonic:   calcium (as carbonate) 600 mg oral tablet ; Simple Display Line:   600 mg, 1 tab(s), PO, Daily, 0 Refill(s) ; Catalog Code:   calcium carbonate ; Order Dt/Tm:   10/02/2017 17:18:41           oxyCODONE  :   oxyCODONE ; Status:   Documented ; Ordered As Mnemonic:   oxyCODONE ; Simple Display Line:   5 mg, PO, as needed, 0 Refill(s) ; Catalog Code:   oxyCODONE ; Order Dt/Tm:   09/29/2017 15:24:05           cholecalciferol  :   cholecalciferol ; Status:   Discontinued ; Ordered As Mnemonic:   Vitamin D3 ; Simple Display Line:   1,000 Int_Unit, PO, Daily, 0 Refill(s) ; Catalog Code:   cholecalciferol ; Order Dt/Tm:   09/04/2017 14:19:46           codeine-guaifenesin  :   codeine-guaifenesin ; Status:   Discontinued ; Ordered As Mnemonic:   Robitussin AC ; Simple Display Line:   10 mL, PO, q4hr, 10ml twice a day as needed, 0 Refill(s) ; Catalog Code:   codeine-guaifenesin ; Order Dt/Tm:   08/14/2017 11:47:36           polyethylene glycol 3350  :    polyethylene glycol 3350 ; Status:   Discontinued ; Ordered As Mnemonic:   MiraLax oral powder for reconstitution ; Simple Display Line:   17 Gm, PO, Daily, PRN: Constipation, 0 Refill(s) ; Catalog Code:   polyethylene glycol 3350 ; Order Dt/Tm:   08/14/2017 11:47:11           ondansetron  :   ondansetron ; Status:   Discontinued ; Ordered As Mnemonic:   Zofran 4 mg oral tablet ; Simple Display Line:   See Instructions, 1 tab(s) PO q8hr PRN, 0 Refill(s) ; Catalog Code:   ondansetron ; Order Dt/Tm:   08/14/2017 11:46:16           omeprazole  :   omeprazole ; Status:   Discontinued ; Ordered As Mnemonic:   omeprazole ; Simple Display Line:   20 mg, PO, Daily, 0 Refill(s) ; Catalog Code:   omeprazole ; Order Dt/Tm:   08/14/2017 11:45:57           fentaNYL  :   fentaNYL ; Status:   Discontinued ; Ordered As Mnemonic:   fentaNYL ; Simple Display Line:   25 mcg, q72hr, change q 3 days fentayl 25 mgPartial fill upon request, 0 Refill(s) ; Catalog Code:   fentaNYL ; Order Dt/Tm:   08/14/2017 11:39:32           loperamide  :   loperamide ; Status:   Discontinued ; Ordered As Mnemonic:   loperamide 2 mg oral tablet ; Simple Display Line:   2 mg, 1 tab(s), PO, q6hr, as needed, 0 Refill(s) ;  Catalog Code:   loperamide ; Order Dt/Tm:   08/14/2017 11:38:26           acetaminophen  :   acetaminophen ; Status:   Discontinued ; Ordered As Mnemonic:   acetaminophen 325 mg oral tablet ; Simple Display Line:   650 mg, 2 tab(s), PO, q4hr, PRN: Fever/Pain, Mild to Moderate, 0 Refill(s) ; Ordering Provider:   Susette Racer MD, Collene Leyden; Catalog Code:   acetaminophen ; Order Dt/Tm:   07/22/2017 08:43:08           Al hydroxide/Mg hydroxide/simethicone  :   Al hydroxide/Mg hydroxide/simethicone ; Status:   Discontinued ; Ordered As Mnemonic:   aluminum hydroxide/magnesium hydroxide/simethicone 200 mg-200 mg-20 mg/5 mL oral suspension ; Simple Display Line:   30 mL, PO, q4hr, PRN: Dyspepsia, 0 Refill(s) ; Ordering Provider:   Susette Racer MD, Collene Leyden;  Catalog Code:   Al hydroxide/Mg hydroxide/simethicone ; Order Dt/Tm:   07/22/2017 08:43:18           docusate  :   docusate ; Status:   Discontinued ; Ordered As Mnemonic:   docusate sodium 100 mg oral capsule ; Simple Display Line:   100 mg, 1 cap(s), PO, BID, PRN: Constipation, 0 Refill(s) ; Ordering Provider:   Susette Racer MD, Collene Leyden; Catalog Code:   docusate ; Order Dt/Tm:   07/22/2017 08:43:25           lisinopril  :   lisinopril ; Status:   Discontinued ; Ordered As Mnemonic:   lisinopril 5 mg oral tablet ; Simple Display Line:   5 mg, 1 tab(s), PO, Daily, 0 Refill(s) ; Ordering Provider:   Susette Racer MD, Collene Leyden; Catalog Code:   lisinopril ; Order Dt/Tm:   07/22/2017 08:43:40           aspirin  :   aspirin ; Status:   Documented ; Ordered As Mnemonic:   aspirin ; Simple Display Line:   81 mg, Chewed, Daily, 0 Refill(s) ; Catalog Code:   aspirin ; Order Dt/Tm:   07/18/2017 08:40:53           gabapentin  :   gabapentin ; Status:   Documented ; Ordered As Mnemonic:   gabapentin 300 mg oral capsule ; Simple Display Line:   300 mg, 1 cap(s), PO, BID, 0 Refill(s) ; Catalog Code:   gabapentin ; Order Dt/Tm:   07/12/2017 18:01:00           ALPRAZolam  :   ALPRAZolam ; Status:   Discontinued ; Ordered As Mnemonic:   Xanax ; Simple Display Line:   See Instructions, 0.25 mg PO TID PRN, PRN: as needed for anxiety, 0 Refill(s) ; Catalog Code:   ALPRAZolam ; Order Dt/Tm:   09/07/2015 07:07:23             [facility] Social History   Cigarette Smoking Last 365 Days :   No   Exposure to Tobacco Smoke :   Former smoker   Patient used other tobacco products in the last 30 days? :   No   Alfred Levins - 09/21/2019 13:51    Social History   (As Of: 09/21/2019 13:53:37 )   Tobacco:        Former smoker   (Last Updated: 10/19/2015 07:51:16  by Konrad Penta RN, Johnny Bridge)          Alcohol:        None   (Last Updated: 10/19/2015 07:51:23  by Konrad Penta RN, Johnny Bridge)  Substance Use:        Never   (Last Updated: 10/19/2015 07:51:50  by Konrad Penta RN, Johnny Bridge)           Home/Environment:        Lives with Children.   Comments:  08/14/2017 11:49 - Margaretha Glassing RN, [name]: lives with daughter and brother has 7 children   (Last Updated: 08/14/2017 11:49:19  by Margaretha Glassing RN, [name])          Nutrition:        Regular   (Last Updated: 08/14/2017 11:41:02  by Margaretha Glassing RN, [name])          Employment/School:        Retired   (Last Updated: 08/14/2017 11:40:49  by Margaretha Glassing RN, [name])            Advance Directive   Advanced Directives :   No   Alfred Levins - 09/21/2019 13:51    CCA Encounter   Onc Type of Patient :   Outpatient Visit Existing   Onc Visit Level, Existing :   Level 1   Alfred Levins - 09/21/2019 13:51

## 2019-09-26 ENCOUNTER — Ambulatory Visit

## 2019-09-26 ENCOUNTER — Ambulatory Visit: Admitting: Urology

## 2019-09-26 NOTE — Progress Notes (Signed)
* * *      [name], [name] **DOB:** 08-May-1931 (86 yo M) **Acc No.** [id number] **DOS:**  09/26/2019    ---       **[name]**    ------    45 Y old Male, DOB: 12-21-1931    [address] [place], [place], [place] [address]    Home: [phone]    Provider: [name]        * * *    Telephone Encounter    ---    Answered by  Wendelyn Breslow Date: 09/26/2019       Time: 04:22 PM    Reason  [facility] Bone Imaging whole body    ------            Message                     Good afternoon Dr, [name], I went to schedule patient for the [facility] Imaging whole body, but Central scheduling said that Dr. Betti Cruz from the Cancer center already scheduled the [facility] whole body for 10/05/2019 @8 :00am as well as a few CT scans. Please aadvise??                Action Taken                     Morse,Linda  09/26/2019 4:24:07 PM >      [name] 09/26/2019 04:55:52 PM > no problem as long as they are scheduled, no need to double up but make sure my name is CC'd the results      Morse,Linda  09/29/2019 1:28:38 PM > will do.. Daughter aware and will have them CC Dr. [name]                    * * *                ---          * * *        Provider: [name] 09/26/2019    ---    Note generated by eClinicalWorks EMR/PM Software ([email or web address])

## 2019-09-28 ENCOUNTER — Telehealth: Payer: Self-pay | Admitting: Oncology

## 2019-09-28 NOTE — Telephone Encounter (Signed)
[facility]ased [facility] [phone]

## 2019-10-03 ENCOUNTER — Ambulatory Visit: Admitting: Internal Medicine

## 2019-10-03 LAB — HX MAGNESIUM LEVEL
CASE NUMBER: 2021207001896
HX MAGNESIUM LVL: 2.1 mg/dL — NL (ref 1.7–2.5)

## 2019-10-03 LAB — HX ALBUMIN LEVEL
CASE NUMBER: 2021207001896
HX ALBUMIN LVL: 3.6 g/dL — NL (ref 3.2–5.0)

## 2019-10-03 LAB — HX CREATININE LEVEL
CASE NUMBER: 2021207001896
HX CREATININE: 1.58 mg/dL — ABNORMAL HIGH (ref 0.55–1.3)

## 2019-10-03 LAB — HX CALCIUM LEVEL TOTAL
CASE NUMBER: 2021207001896
HX CALCIUM LVL: 8.4 mg/dL — ABNORMAL LOW (ref 8.5–10.5)

## 2019-10-03 LAB — HX GLOMERULAR FILTRATION RATE (ESTIMATED)
CASE NUMBER: 2021207001896
HX AFN AMER GLOMERULAR FILTRATION RATE: 45 mL/min/{1.73_m2}
HX NON-AFN AMER GLOMERULAR FILTRATION RATE: 39 mL/min/{1.73_m2}

## 2019-10-03 LAB — HX PHOSPHORUS LEVEL
CASE NUMBER: 2021207001896
HX PHOSPHORUS: 2.2 mg/dL — ABNORMAL LOW (ref 2.4–4.9)

## 2019-10-03 NOTE — Other (Deleted)
 [email or web address] Intake Entered On:  10/03/2019 13:23     Performed On:  10/03/2019 13:23  by Radene Knee               General Information - Med-Onc   VAD Monthly Flush :   No   Radene Knee - 10/03/2019 13:27    Fall Risk Assessment :   No   Have you been hospitalized since your last visit? :   No   Pain Symptoms :   No   Have you had any tests SLV :   No   Radene Knee - 10/03/2019 13:23    Infectious Disease Risk Screening   Patient Travel History :   No recent travel   Recent Family Travel History :   No recent travel   Close contact w/ lab-confirmed case COVID-19 :   No   Close contact w/ suspect case COVID-19 :   No   Laboratory confirmed COVID-19 test result? :   No   Recently exposed to COVID-19? :   No   Received the COVID-19 Vaccine? :   Yes   COVID-19 Vaccine Comments :   Pt states he received pfizer vaccine x2 doses in [place]   Radene Knee - 10/03/2019 13:27    Medication History   MAO Inhibitor in past 3 weeks :   No   Radene Knee - 10/03/2019 13:27    Medication List   (As Of: 10/03/2019 13:29:47 )   Normal Order    denosumab 120 mg/1.7 mL  :   denosumab 120 mg/1.7 mL ; Status:   Ordered ; Ordered As Mnemonic:   Xgeva ; Simple Display Line:   120 mg, 1.7 mL, sc, Once ; Ordering Provider:   Dayton Scrape NP, Osborne Casco; Catalog Code:   denosumab ; Order Dt/Tm:   10/03/2019 13:19:56  ; Comment:   q4week treatment          potass & sodium phosphates pack  :   potass & sodium phosphates pack ; Status:   Ordered ; Ordered As Mnemonic:   potassium phosphate-sodium phosphate ; Simple Display Line:   250 mg, 1 EA, PO, Once ; Ordering Provider:   Dayton Scrape NP, Osborne Casco; Catalog Code:   potassium phosphate-sodium phosphate ; Order Dt/Tm:   10/03/2019 13:21:06  ; Comment:   1 packet = 250mg   Each packet contains: Potassium 7 mEq, Phosphate  8 mmol and Sodium 7 mEq            Home Meds    enzalutamide  :   enzalutamide ; Status:   Documented ; Ordered As Mnemonic:   Xtandi 40 mg oral tablet ; Simple Display Line:   80 mg,  2 tab(s), PO, Daily, 0 Refill(s) ; Catalog Code:   enzalutamide ; Order Dt/Tm:   09/21/2019 13:53:12           furosemide  :   furosemide ; Status:   Documented ; Ordered As Mnemonic:   furosemide 40 mg oral tablet ; Simple Display Line:   0 Refill(s) ; Catalog Code:   furosemide ; Order Dt/Tm:   09/21/2019 13:53:19           losartan  :   losartan ; Status:   Documented ; Ordered As Mnemonic:   losartan 50 mg oral tablet ; Simple Display Line:   0 Refill(s) ; Catalog Code:   losartan ; Order Dt/Tm:   09/21/2019 13:53:19           calcium carbonate  :  calcium carbonate ; Status:   Documented ; Ordered As Mnemonic:   calcium (as carbonate) 600 mg oral tablet ; Simple Display Line:   600 mg, 1 tab(s), PO, Daily, 0 Refill(s) ; Catalog Code:   calcium carbonate ; Order Dt/Tm:   10/02/2017 17:18:41           oxyCODONE  :   oxyCODONE ; Status:   Documented ; Ordered As Mnemonic:   oxyCODONE ; Simple Display Line:   5 mg, PO, as needed, 0 Refill(s) ; Catalog Code:   oxyCODONE ; Order Dt/Tm:   09/29/2017 15:24:05           aspirin  :   aspirin ; Status:   Documented ; Ordered As Mnemonic:   aspirin ; Simple Display Line:   81 mg, Chewed, Daily, 0 Refill(s) ; Catalog Code:   aspirin ; Order Dt/Tm:   07/18/2017 08:40:53           gabapentin  :   gabapentin ; Status:   Documented ; Ordered As Mnemonic:   gabapentin 300 mg oral capsule ; Simple Display Line:   300 mg, 1 cap(s), PO, BID, 0 Refill(s) ; Catalog Code:   gabapentin ; Order Dt/Tm:   07/12/2017 18:01:00             Social History   Current Smoking Status :   Former smoker   Did the patient smoke (past 12 months) :   No tobacco smoking   Patient used other tobacco products in the last 30 days? :   No   Radene Knee - 10/03/2019 13:27    Social History   (As Of: 10/03/2019 13:29:47 )   Tobacco:        Former smoker   (Last Updated: 10/19/2015 07:51:16  by Konrad Penta RN, Johnny Bridge)          Alcohol:        None   (Last Updated: 10/19/2015 07:51:23  by Konrad Penta RN, Johnny Bridge)           Substance Use:        Never   (Last Updated: 10/19/2015 07:51:50  by Konrad Penta RN, Johnny Bridge)          Home/Environment:        Lives with Children.   Comments:  08/14/2017 11:49 - Margaretha Glassing RN, [name]: lives with daughter and brother has 7 children   (Last Updated: 08/14/2017 11:49:19  by Margaretha Glassing RN, [name])          Nutrition:        Regular   (Last Updated: 08/14/2017 11:41:02  by Margaretha Glassing RN, [name])          Employment/School:        Retired   (Last Updated: 08/14/2017 11:40:49  by Margaretha Glassing RN, [name])            ONC Nutrition   MST Patient Able to Complete Assessment :   Yes   MST Score :   0    MST Lose Weight Without Trying :   No   Radene Knee - 10/03/2019 13:27

## 2019-10-03 NOTE — Other (Addendum)
 Med-Onc Intake Entered On:  10/03/2019 13:23     Performed On:  10/03/2019 13:23  by Radene Knee               General Information - Med-Onc   VAD Monthly Flush :   No   Radene Knee - 10/03/2019 13:27    Fall Risk Assessment :   No   Have you been hospitalized since your last visit? :   No   Pain Symptoms :   No   Have you had any tests SLV :   No   Radene Knee - 10/03/2019 13:23    Infectious Disease Risk Screening   Patient Travel History :   No recent travel   Recent Family Travel History :   No recent travel   Close contact w/ lab-confirmed case COVID-19 :   No   Close contact w/ suspect case COVID-19 :   No   Laboratory confirmed COVID-19 test result? :   No   Recently exposed to COVID-19? :   No   Received the COVID-19 Vaccine? :   Yes   COVID-19 Vaccine Comments :   Pt states he received pfizer vaccine x2 doses in [place]   Radene Knee - 10/03/2019 13:27    Medication History   MAO Inhibitor in past 3 weeks :   No   Radene Knee - 10/03/2019 13:27    Medication List   (As Of: 10/03/2019 13:29:47 )   Normal Order    denosumab 120 mg/1.7 mL  :   denosumab 120 mg/1.7 mL ; Status:   Ordered ; Ordered As Mnemonic:   Xgeva ; Simple Display Line:   120 mg, 1.7 mL, sc, Once ; Ordering Provider:   Dayton Scrape NP, Osborne Casco; Catalog Code:   denosumab ; Order Dt/Tm:   10/03/2019 13:19:56  ; Comment:   q4week treatment          potass & sodium phosphates pack  :   potass & sodium phosphates pack ; Status:   Ordered ; Ordered As Mnemonic:   potassium phosphate-sodium phosphate ; Simple Display Line:   250 mg, 1 EA, PO, Once ; Ordering Provider:   Dayton Scrape NP, Osborne Casco; Catalog Code:   potassium phosphate-sodium phosphate ; Order Dt/Tm:   10/03/2019 13:21:06  ; Comment:   1 packet = 250mg   Each packet contains: Potassium 7 mEq, Phosphate  8 mmol and Sodium 7 mEq            Home Meds    enzalutamide  :   enzalutamide ; Status:   Documented ; Ordered As Mnemonic:   Xtandi 40 mg oral tablet ; Simple Display Line:   80 mg, 2  tab(s), PO, Daily, 0 Refill(s) ; Catalog Code:   enzalutamide ; Order Dt/Tm:   09/21/2019 13:53:12           furosemide  :   furosemide ; Status:   Documented ; Ordered As Mnemonic:   furosemide 40 mg oral tablet ; Simple Display Line:   0 Refill(s) ; Catalog Code:   furosemide ; Order Dt/Tm:   09/21/2019 13:53:19           losartan  :   losartan ; Status:   Documented ; Ordered As Mnemonic:   losartan 50 mg oral tablet ; Simple Display Line:   0 Refill(s) ; Catalog Code:   losartan ; Order Dt/Tm:   09/21/2019 13:53:19           calcium carbonate  :  calcium carbonate ; Status:   Documented ; Ordered As Mnemonic:   calcium (as carbonate) 600 mg oral tablet ; Simple Display Line:   600 mg, 1 tab(s), PO, Daily, 0 Refill(s) ; Catalog Code:   calcium carbonate ; Order Dt/Tm:   10/02/2017 17:18:41           oxyCODONE  :   oxyCODONE ; Status:   Documented ; Ordered As Mnemonic:   oxyCODONE ; Simple Display Line:   5 mg, PO, as needed, 0 Refill(s) ; Catalog Code:   oxyCODONE ; Order Dt/Tm:   09/29/2017 15:24:05           aspirin  :   aspirin ; Status:   Documented ; Ordered As Mnemonic:   aspirin ; Simple Display Line:   81 mg, Chewed, Daily, 0 Refill(s) ; Catalog Code:   aspirin ; Order Dt/Tm:   07/18/2017 08:40:53           gabapentin  :   gabapentin ; Status:   Documented ; Ordered As Mnemonic:   gabapentin 300 mg oral capsule ; Simple Display Line:   300 mg, 1 cap(s), PO, BID, 0 Refill(s) ; Catalog Code:   gabapentin ; Order Dt/Tm:   07/12/2017 18:01:00             Social History   Current Smoking Status :   Former smoker   Did the patient smoke (past 12 months) :   No tobacco smoking   Patient used other tobacco products in the last 30 days? :   No   Radene Knee - 10/03/2019 13:27    Social History   (As Of: 10/03/2019 13:29:47 )   Tobacco:        Former smoker   (Last Updated: 10/19/2015 07:51:16  by Konrad Penta RN, Johnny Bridge)          Alcohol:        None   (Last Updated: 10/19/2015 07:51:23  by Konrad Penta RN, Johnny Bridge)           Substance Use:        Never   (Last Updated: 10/19/2015 07:51:50  by Konrad Penta RN, Johnny Bridge)          Home/Environment:        Lives with Children.   Comments:  08/14/2017 11:49 - Margaretha Glassing RN, [name]: lives with daughter and brother has 7 children   (Last Updated: 08/14/2017 11:49:19  by Margaretha Glassing RN, [name])          Nutrition:        Regular   (Last Updated: 08/14/2017 11:41:02  by Margaretha Glassing RN, [name])          Employment/School:        Retired   (Last Updated: 08/14/2017 11:40:49  by Margaretha Glassing RN, [name])            ONC Nutrition   MST Patient Able to Complete Assessment :   Yes   MST Score :   0    MST Lose Weight Without Trying :   No   Radene Knee - 10/03/2019 13:27

## 2019-10-05 ENCOUNTER — Ambulatory Visit: Admitting: Adult Health

## 2019-10-07 ENCOUNTER — Ambulatory Visit

## 2019-10-07 ENCOUNTER — Ambulatory Visit: Admitting: Urology

## 2019-10-07 NOTE — Progress Notes (Signed)
* * *      JOEVANNI, RODDEY **DOB:** Apr 27, 1931 (86 yo M) **Acc No.** [id number] **DOS:**  10/07/2019    ---       **[name]**    ------    70 Y old Male, DOB: [birth year]    [address] [place], [place], [place] [address]    Home: [phone]    Provider: [name]        * * *    Telephone Encounter    ---    Answered by  Margarite Gouge Date: 10/07/2019       Time: 01:20 PM    Reason  Surgery - Pending Booking Conf    ------            Message                     Patient aware of surgery on 10/24/19 at [facility]. All paperwork faxed to [facility] and mailed to patient.            Prior auth - not required per Lubertha Basque at [facility] - reference #[id number]      Referral - not required                                                       Pre-screening - not required                                                                                           COVID vaccinated - Pfizer      Medical clearance - not required                Action Taken                     Nikolis Berent,Patricia  10/07/2019 1:21:29 PM > pending booking conf      Ioane Bhola,Patricia  10/12/2019 4:27:58 PM > Noreene Larsson received booking                     * * *                ---          * * *        Provider: [name] 10/07/2019    ---    Note generated by eClinicalWorks EMR/PM Software ([email or web address])

## 2019-10-10 ENCOUNTER — Ambulatory Visit: Admitting: Urology

## 2019-10-10 NOTE — Progress Notes (Signed)
* * *      [name], [name] **DOB:** [birth year] (86 yo M) **Acc No.** [id number] **DOS:**  10/10/2019    ---       **[name]**    ------    58 Y old Male, DOB: Dec 14, 1931    [address] [place], [place], [place] [address]    Home: [phone]    Provider: [name]        * * *    Telephone Encounter    ---    Answered by  Earney Navy Date: 10/10/2019       Time: 04:39 PM    Reason  xtandi refill    ------            Action Taken                     DaSilva,Brittany  10/10/2019 4:40:03 PM > spoke to daughter- because [facility] has been trying to [facility] of them for consent to send out Xtandi medication- infomred her that they need verbal consent from her prior to sending out his refill- made her aware, also have her the number so she can call directly.                     * * *                ---          * * *        Provider: [name] 10/10/2019    ---    Note generated by eClinicalWorks EMR/PM Software ([email or web address])

## 2019-10-13 ENCOUNTER — Ambulatory Visit: Admitting: Urology

## 2019-10-13 NOTE — Progress Notes (Signed)
* * *      [name], [name] **DOB:** 01/06/1932 (86 yo M) **Acc No.** [id number] **DOS:**  10/13/2019    ---       **[name]**    ------    50 Y old Male, DOB: May 11, 1931    [address] [place], [place], [place] [address]    Home: [phone]    Provider: [name]        * * *    Telephone Encounter    ---    Answered by  Wendelyn Breslow Date: 10/13/2019       Time: 10:44 AM    Caller  wife    ------            Reason  [facility].            Message                     patients RX xtandi is going to be 2000.00 dollars an month @ [facility]  . [facility] send over a form to have Dr. [name] to fill out for patient can get RX for free.[name] [phone]                 Action Taken                     Morse,Linda  10/13/2019 10:46:09 AM >      DaSilva,Brittany  10/13/2019 11:14:43 AM > form placed in RA cubby and completed- awwaiting sx and will fax over tomorrow.                     * * *                ---          * * *        Provider: [name] 10/13/2019    ---    Note generated by eClinicalWorks EMR/PM Software ([email or web address])

## 2019-10-14 ENCOUNTER — Ambulatory Visit: Admitting: Urology

## 2019-10-14 NOTE — Progress Notes (Signed)
* * *      [name], [name] **DOB:** December 02, 1931 (86 yo M) **Acc No.** [id number] **DOS:**  10/14/2019    ---       **[name]**    ------    29 Y old Male, DOB: 1931-10-07    [address] [place], [place], [place] [address]    Home: [phone]    Provider: [name]        * * *    Telephone Encounter    ---    Answered by  Georgie Chard Date: 10/14/2019       Time: 09:10 AM    Caller  Pharmacy    ------            Reason  FYI only            Message                     [facility] called to speak with nurse because they had sent a manufacturer application for pt, but that is no longer needed because they are instead going to apply for a grant for the pt. You can call [phone] with any questions, and if pt calls with questions we can refer him to that phone number as well.                Action Taken                     [facility]  10/14/2019 9:11:14 AM >      DaSilva,Brittany  10/14/2019 9:24:39 AM >called [facility] to verify- this is accurate however they need to speak directly to the patient or daughter to further proceed.- called daughter to make her aware- she is going to call the [facility] number now and proceed.                     * * *                ---          * * *        Provider: [name] 10/14/2019    ---    Note generated by eClinicalWorks EMR/PM Software ([email or web address])

## 2019-10-19 ENCOUNTER — Ambulatory Visit: Admitting: Urology

## 2019-10-19 LAB — HX BASIC METABOLIC PANEL
CASE NUMBER: 2021223001360
HX ANION GAP: 5 — NL (ref 3.0–11.0)
HX BUN: 22 mg/dL — NL (ref 8.0–23.0)
HX CALCIUM LVL: 8.8 mg/dL — NL (ref 8.5–10.5)
HX CHLORIDE: 112 mmol/L — ABNORMAL HIGH (ref 98.0–110.0)
HX CO2: 26 mmol/L — NL (ref 21.0–32.0)
HX CREATININE: 1.64 mg/dL — ABNORMAL HIGH (ref 0.55–1.3)
HX GLUCOSE LVL: 157 mg/dL — ABNORMAL HIGH (ref 70.0–110.0)
HX POTASSIUM LVL: 4.7 mmol/L — NL (ref 3.6–5.2)
HX SODIUM LVL: 143 mmol/L — NL (ref 136.0–146.0)

## 2019-10-19 LAB — HX PSC EKG LAB: CASE NUMBER: 2021223001360

## 2019-10-19 LAB — HX CBC W/ INDICES
CASE NUMBER: 2021223001360
HX ABSOLUTE NRBC COUNT: 0 10*3/uL
HX HCT: 34.8 % — ABNORMAL LOW (ref 39.0–53.0)
HX HGB: 11.2 g/dL — ABNORMAL LOW (ref 13.0–17.5)
HX MCH: 34.4 pg — ABNORMAL HIGH (ref 26.0–34.0)
HX MCHC: 32.2 g/dL — NL (ref 31.0–37.0)
HX MCV: 106.7 fL — ABNORMAL HIGH (ref 80.0–100.0)
HX MPV: 10.8 fL — NL (ref 9.4–12.4)
HX NRBC PERCENT: 0 % — NL
HX PLATELET: 138 10*3/uL — ABNORMAL LOW (ref 150.0–400.0)
HX RBC: 3.26 10*6/uL — ABNORMAL LOW (ref 4.2–5.9)
HX RDW-CV: 13.6 % — NL (ref 11.5–14.5)
HX RDW-SD: 53.3 fL — ABNORMAL HIGH (ref 35.0–51.0)
HX WBC: 4.1 10*3/uL — NL (ref 4.0–11.0)

## 2019-10-19 LAB — HX PTT
CASE NUMBER: 2021223001360
HX APTT: 25 s — NL (ref 23.0–32.0)

## 2019-10-19 LAB — HX GLOMERULAR FILTRATION RATE (ESTIMATED)
CASE NUMBER: 2021223001360
HX AFN AMER GLOMERULAR FILTRATION RATE: 43 mL/min/{1.73_m2}
HX NON-AFN AMER GLOMERULAR FILTRATION RATE: 37 mL/min/{1.73_m2}

## 2019-10-19 LAB — HX PT
CASE NUMBER: 2021223001360
HX INR: 1
HX PT: 11.2 s — NL (ref 9.3–11.6)

## 2019-10-24 ENCOUNTER — Ambulatory Visit: Admitting: Urology

## 2019-10-24 NOTE — Op Note (Signed)
 ____________________________________________________________    OPERATIVE REPORT  DATE:  10/24/2019    PREOPERATIVE DIAGNOSIS:  Phimosis.    POSTOPERATIVE DIAGNOSIS:  Phimosis.    PROCEDURES:  Circumcision.    SURGEON:  Georgiann Neider E. [name], [name]    ASSISTANT:  None.    ANESTHESIA:  General and local.    FLUIDS:  As per anesthesia.    ESTIMATED BLOOD LOSS:  Minimal.    INDICATIONS:  The patient is an 86 year old male with a history of prostate   cancer among other issues.  He also has complete phimosis of his foreskin.  He   is unable to retract it.  After having a long discussion with the patient and   his daughter in the office, they have elected for circumcision.    FINDINGS:  Under anesthesia, I was able to eventually get the foreskin back.    There was a lot of smegma but cleaned off the glans and the meatus were normal.    DESCRIPTION OF PROCEDURE:  After being placed under general anesthesia, I was   able to pull the foreskin back.  The patient was then prepped and draped in the   usual sterile fashion.  The foreskin was then pulled back over.    At that point  in time, I used a marking pen to mark the outer foreskin along the corona making  a V up towards the frenulum.  I injected about 7 mL of plain Marcaine as a   penile block and then at that point in time using traction, countertraction with  a sharp 15 blade scalpel, I followed my outer foreskin marking and incised the   outer foreskin.  I used a little bit of low current hemo cautery for hemostasis   purposes as we were doing this.  Once the outer foreskin was incised, I then   pulled back to the inner foreskin again with traction, countertraction and   incised the inner foreskin about 3-5 mm away from the corona circumferentially.   Once that was done, I connected the inner and outer foreskin with hemostats and   Metzenbaum scissors and then I circumferentially peeled the foreskin off of the   shaft with traction countertraction cautery and Metzenbaum  scissors.  Once it   was completely off, I held pressure for five minutes.  We checked hemostasis,   which was intact and then put four quadrant sutures of 4-0 Vicryl including a   U-stitch at the frenulum.  I then bisected and transected each quadrant with   interrupted 4-0 Vicryl sutures until all epithelial edges were closed.  A small   Vaseline gauze, and 1-inch Kling were applied.  The patient tolerated the   procedure well.  There were no complications.  The patient was discharged to the  Recovery Room in hemodynamically stable condition.    Dictated by:  [name] [name], [name]    DD: 10/24/2019 08:45:25  DT: 10/25/2019 11:30:00  REA/tam  Job #: [id number]   SIGNATURE LINE    Electronically signed by [name] MD, Sophiagrace Benbrook E on 10/25/2019 at 14:09:26 EST

## 2019-10-24 NOTE — Progress Notes (Signed)
 Name :  [name], [name]    DOB :  [id number]    Sex :  Male    MRN :  [id number]    Pre-op Diagnosis    phimosis    Post-op Diagnosis    same    Procedures    Date Procedure Modifier Comments    10/24/19 Circumcision  N/A      Case Attendees    Attendee Role    Channamsetty MD, Vijay Anesthesiologist of Record    [name] MD, [name] Primary Surgeon      Anesthesia Type    Nicholos Johns MD, Raelene Bott (Supervisor)    Claiborne Billings CRNA, Nicholos Johns (Provider)    Estimated Blood Loss    2.0 mL    Transfusions    Transfusions    No qualifying data available.    Catheters, Tubes, Drains    Catheters, Drains, and Tubes    No qualifying data available.    Operative Implants    *** No implants in past 24 hours ***    Tourniquet    Operative Specimens    Date/Time Obtained Specimen Description Frozen Section Tests Requested    10/24/19 8:10:00 EDT FORESKIN No Pathology Tissue Exam Request          Operative Fluids    - Parenteral in ml    - Drains in ml    - Urine Output in ml    Findings    see dict op note    Complications    none    Discharge Status    stable    OR Disposition    PACU        ------        SIGNATURE LINE Electronically signed by [name] MD, Shaynna Husby E on 10/24/2019 at  08:46:04 EST

## 2019-10-24 NOTE — Progress Notes (Signed)
 * * *    [name], [name] **DOB:** 22-Oct-1931 (86 yo M) **Acc No.** [id number] **DOS:**  10/24/2019    ---       **[name]**    ------    60 Y old Male, DOB: Apr 03, 1931    Account Number: [id number]    [address] [place], [place], [place]    Home: [phone]    Guarantor: [name], [name] Insurance: AARP Medicare Complete Payer ID:  [id number]    PCP: [facility]    Appointment Facility: [facility]        * * *    10/24/2019 Progress Notes: [name], [name]    ------    ---       **Current Medications**    ---    Taking    * Vitamin B12     ---    * Furosemide 40 MG Tablet 1 tablet Orally Once a day    ---    * Losartan Potassium 100 MG Tablet 1 tablet Orally Once a day    ---    * Omeprazole 20 MG Capsule Delayed Release 1 capsule Orally Once a day    ---    * Gabapentin 300 MG Capsule 1 capsule Orally Once a day    ---    * Aspirin 81 MG Tablet Delayed Release 1 tablet Orally Once a day    ---    * Calcium     ---    * Xtandi 80 MG Tablet 1 tablet Orally twice a day    ---    Not-Taking/PRN    * Megace Oral     ---    * Acetaminophen 500 MG Capsule 1 capsule as needed Orally every 6 hrs    ---    * Lisinopril 5 MG Tablet 1 tablet Orally Once a day    ---    * Metoprolol Succinate ER 25 MG Tablet Extended Release 24 Hour 1 tablet Orally Once a day    ---    * MiraLax - Powder as directed Orally     ---    * oxyCODONE HCl 5 MG/5ML Solution 5 ml as needed Orally every 6 hrs    ---    * Milk of Magnesia 400 MG/5ML Suspension 5 ml as needed Orally Four times a day    ---    * Bisacodyl 10 MG Suppository 1 suppository as needed Rectal Once a day    ---    * Casodex 50 MG Tablet 1 tablet Orally Once a day    ---    * Centrum Silver Tablet as directed Orally     ---    * hydroCHLOROthiazide 25 MG Tablet 1 tablet Orally Once a day    ---    * Xanax 0.25 MG Tablet 1 tablet Orally Twice a day    ---    * HYDROcodone-Acetaminophen 5-325 MG Tablet 1 tablet as needed  Orally every 6 hrs    ---    * Amiodarone HCl 200 MG Tablet 1 tablet Orally Once a day    ---    * Zestril 5 MG Tablet 1 tablet Orally Once a day    ---    * Zocor 10 MG Tablet 1 tablet every evening Orally Once a day    ---    * Xarelto 15 MG Tablet Orally     ---     Past Medical History    ---  prostate cancer: Dx'd 7/06; S/P XRT: 11/06-1/07: gleason's 8 and then Dx'd  with met prostate cancer in 07/2017: Lupron on/off.        ---    GERD.        ---    Hypercholesterolemia.        ---    Hypertension.        ---    Anxiety.        ---    Atrial Fibrillation: @ 2017.        ---    ED - Impotence of organic origin.        ---    Renal insufficiency.        ---    COVID Vacc: Pfizer 05/2019.        ---       **Reason for Appointment**    ---      1\. [facility], Outpt, 2 hr (90 min c/c) Circumcision --phimosis -- [name] received  booking    ---      **Assessments**    ---    1\. Phimosis - N47.1    ---      **Treatment**    ---      **1\. Phimosis**    Notes: Circumcision.    ---    Electronically signed by Leola Brazil , MD on 10/24/2019 at 01:50 PM EDT    Sign off status: Completed        * * *        [facility]    [address]    [place], [place] [id number]    Tel: [phone]    Fax:              * * *          Progress Note: [name] E. [name], [name] 10/24/2019    ---    Note generated by eClinicalWorks EMR/PM Software ([email or web address])

## 2019-10-24 NOTE — H&P (Signed)
 ____________________________________________________________    PREOPERATIVE HISTORY AND PHYSICAL  ADMITTED:  10/24/2019    HISTORY OF PRESENT ILLNESS:  The patient is an 86 year old male with a history   of prostate cancer.  He was initially treated with radiation therapy back in   2007 and then treated with Lupron on and off since then.  Recently, his PSA has   been rising and he has been on Lupron and some secondary antiandrogens.  He has   also lived between here and [place], so his followup has been kind of on   and off.  He also recently on exam, he had a complete phimosis.  I went over the  reasons, risks and benefits of doing a circumcision with the patient and his   daughter, and they understand the above.  The patient is coming in for   circumcision.    PAST MEDICAL HISTORY:  Includes the above as well as GERD, hypercholesterolemia,  hypertension, anxiety, atrial fibrillation, and renal insufficiency.  He has   been vaccinated for COVID.    PAST SURGICAL HISTORY:  Transrectal ultrasound and prostate biopsy back in 2006,  cystoscopy in 2018 consistent with some radiation changes, inguinal hernia   repair, cholecystectomy and cardioversion.    FAMILY HISTORY:  None recorded.    SOCIAL HISTORY:  Nonsmoker, minimal alcohol.  No caffeine.    ALLERGIES:  He has no known drug allergies.    REVIEW OF SYSTEMS:  Unremarkable.    PHYSICAL EXAMINATION:  GENERAL:   He is a well-developed, well-nourished male in  no acute distress, alert and oriented x3.    HEENT:  Within normal limits.    CHEST:  Lungs clear to auscultation bilaterally.    HEART:  Regular rate and rhythm.    GENITALIA:  His external genitalia including complete phimosis of his foreskin.     Scrotum, epididymis testes are otherwise unremarkable.  The urethral meatus I   am unable to see.    RECTAL:  Digital rectal exam has been deferred.    EXTREMITIES:   Grossly normal.    NEUROMUSCULAR:  Grossly normal.    IMPRESSION:  The patient has a  complete phimosis.    PLAN:  Circumcision under anesthesia.    Dictated by:  [name] [name], [name]    DD: 10/16/2019 16:14:33  DT: 10/19/2019 07:07:00  REA/tam  Job #: [id number]   SIGNATURE LINE    Electronically signed by [name] MD, [name] on 10/19/2019 at 08:42:27 EST

## 2019-10-24 NOTE — H&P (Signed)
 Name :  JERL, MUNYAN    DOB :  [id number]    Sex :  Male    MRN :  [id number]    Health Status    I have seen and examined the patient and reviewed the current H and P    no changes        ---        SIGNATURE LINE Electronically signed by [name] MD, Lakesia Dahle E on 10/24/2019 at  07:23:48 EST

## 2019-10-26 ENCOUNTER — Ambulatory Visit (HOSPITAL_BASED_OUTPATIENT_CLINIC_OR_DEPARTMENT_OTHER): Admitting: Urology

## 2019-10-26 LAB — HX SURG PATH FINAL REPORT
CASE NUMBER: 0
HX NOTE: 88304

## 2019-10-27 ENCOUNTER — Ambulatory Visit: Admitting: Adult Health

## 2019-10-27 NOTE — Other (Addendum)
 ONC Nurse-[place] Intake Entered On:  10/27/2019 13:34     Performed On:  10/27/2019 13:34  by Alfred Levins               Vital Signs   Numeric Pain Scale Score :   0    Peripheral Pulse Rate :   69 bpm   Respiratory Rate :   16 br/min   Systolic Blood Pressure :   157 mmHg (HI)    Diastolic Blood Pressure :   90 mmHg   Blood Pressure Extremity :   Right arm   Mean Arterial Pressure :   112 mmHg   SpO2 :   97 %   Oxygen Therapy 1 :   Room air   Alfred Levins - 10/27/2019 13:37    Pain Scale Used :   0-10   Alfred Levins - 10/27/2019 13:34    Height/Weight   Weight :   82.63 Kg(Converted to: 182 lb 3 oz)    Type of Weight :   Standing   Type of Height :   Stated height   Alfred Levins - 10/27/2019 13:37    [facility] General Information/Subjective   Chief Complaint :   follow up   High Risk for Falls :   No   Preferred Language :   English   Preferred Communication Mode :   Verbal   Pain Symptoms :   No   Alfred Levins - 10/27/2019 13:37    Patient Preferred Method of Communication   Phone Call   [facility] Infectious Disease Risk Screening   Patient Travel History :   No recent travel   Recent Family Travel History :   No recent travel   Close contact w/ suspect case COVID-19 :   No   Close contact w/ lab-confirmed case COVID-19 :   No   Lab confirmed positive COVID-19 test result? :   No   Recently exposed to COVID-19? :   No   Alfred Levins - 10/27/2019 13:37    Allergy   (As Of: 10/27/2019 13:39:56 )   Allergies (Active)   NKA  Estimated Onset Date:   Unspecified ; Created By:   Ronnie Doss; Reaction Status:   Active ; Category:   Drug ; Substance:   NKA ; Type:   Allergy ; Updated By:   Ronnie Doss; Reviewed Date:   10/27/2019 13:39         Medication History   Medication List   (As Of: 10/27/2019 13:39:56 )   Home Meds    enzalutamide  :   enzalutamide ; Status:   Documented ; Ordered As Mnemonic:   Xtandi 40 mg oral tablet ; Simple Display Line:   80 mg, 2 tab(s), PO, Daily, 0 Refill(s) ; Catalog Code:    enzalutamide ; Order Dt/Tm:   09/21/2019 13:53:12           furosemide  :   furosemide ; Status:   Documented ; Ordered As Mnemonic:   furosemide 40 mg oral tablet ; Simple Display Line:   0 Refill(s) ; Catalog Code:   furosemide ; Order Dt/Tm:   09/21/2019 13:53:19           losartan  :   losartan ; Status:   Documented ; Ordered As Mnemonic:   losartan 50 mg oral tablet ; Simple Display Line:   0 Refill(s) ; Catalog Code:   losartan ; Order Dt/Tm:   09/21/2019 13:53:19  calcium carbonate  :   calcium carbonate ; Status:   Documented ; Ordered As Mnemonic:   calcium (as carbonate) 600 mg oral tablet ; Simple Display Line:   600 mg, 1 tab(s), PO, Daily, 0 Refill(s) ; Catalog Code:   calcium carbonate ; Order Dt/Tm:   10/02/2017 17:18:41           oxyCODONE  :   oxyCODONE ; Status:   Completed ; Ordered As Mnemonic:   oxyCODONE ; Simple Display Line:   5 mg, PO, as needed, 0 Refill(s) ; Catalog Code:   oxyCODONE ; Order Dt/Tm:   09/29/2017 15:24:05           aspirin  :   aspirin ; Status:   Documented ; Ordered As Mnemonic:   aspirin ; Simple Display Line:   81 mg, Chewed, Daily, 0 Refill(s) ; Catalog Code:   aspirin ; Order Dt/Tm:   07/18/2017 08:40:53           gabapentin  :   gabapentin ; Status:   Documented ; Ordered As Mnemonic:   gabapentin 300 mg oral capsule ; Simple Display Line:   300 mg, 1 cap(s), PO, BID, 0 Refill(s) ; Catalog Code:   gabapentin ; Order Dt/Tm:   07/12/2017 18:01:00             [facility] Social History   Cigarette Smoking Last 365 Days :   No   Exposure to Tobacco Smoke :   Former smoker   Patient used other tobacco products in the last 30 days? :   No   Alfred Levins - 10/27/2019 13:37    Social History   (As Of: 10/27/2019 13:39:56 )   Tobacco:        Former smoker   (Last Updated: 10/19/2015 07:51:16  by Konrad Penta RN, Johnny Bridge)          Alcohol:        None   (Last Updated: 10/19/2015 07:51:23  by Konrad Penta RN, Johnny Bridge)          Substance Use:        Never   (Last Updated: 10/19/2015 07:51:50   by Konrad Penta RN, Johnny Bridge)          Home/Environment:        Lives with Children.   Comments:  08/14/2017 11:49 - Margaretha Glassing RN, [name]: lives with daughter and brother has 7 children   (Last Updated: 08/14/2017 11:49:19  by Margaretha Glassing RN, [name])          Nutrition:        Regular   (Last Updated: 08/14/2017 11:41:02  by Margaretha Glassing RN, [name])          Employment/School:        Retired   (Last Updated: 08/14/2017 11:40:49  by Margaretha Glassing RN, [name])            Advance Directive   Advanced Directives :   Yes   Alfred Levins - 10/27/2019 13:36    [facility] Encounter   Onc Type of Patient :   Outpatient Visit Existing   Onc Visit Level, Existing :   Level 1   Alfred Levins - 10/27/2019 13:36

## 2019-10-27 NOTE — Other (Deleted)
 Patient:   [name], [name]            MRN: [id number]            FIN: [id number]               Age:   86 years     Sex:  Male     DOB:  14-Sep-1931   Associated Diagnoses:   None   Author:   Kem Kays MD, Mendel Corning          Chief Complaint: prostate cancer    Oncologic/hematologic History:  He was recently admitted to the hospital s/p fall down the stairs, resulting in a back injury.  Extensive work-up including CT head, CT spine and MRI spine was done.  It revealed evidence of metastatic disease to the back and a mild compression fracture of L2.  There was no evidence of spinal cord compression.  PSA was checked and it returned high at 245.  He was treated conservatively for his fracture and was started on pain meds.  He was then started on Lupron and Casodex, as well as Xgeva. Casodex was then discontinued and he remained on the other two. For his bony met in the back, he underwent kyphoplasty. Repeat PSAs had been low but in early 2021, PSA was noted to have increased to 7.1 in [place]. Restaging scans showed no visceral disease but worsening bony mets. Thus, in March 2021, he was started on Xtandi (reduced dose at 80 mg daily). He moved back to the [place] area in the summer.       Interval History:   Since his last visit here, he has been feeling okay. He is taking Xtandi but only 40 mg because of loose stools. Tolerating it pretty well, with no major side effects. Continues on Xgeva (monthly) and Lupron (q82months) as well.  Complains of back pain.      ROS:  Constitutional: Negative  HEENT: Negative  Respiratory: Negative  Cardiovascular: Negative  Gastrointestinal: Negative  Genitourinary: Negative  Heme/Lymph: Negative  Endocrine: hot flashes  Immunologic: Negative  Musculoskeletal: back pain  Integumentary: Negative  Neurologic: Negative  Psychiatric: Negative  All other ROS: Negative       Allergies:  Allergies (1) Active Reaction  NKA None Documented  ALLERGIES         Home Meds:    Medication List     Active Medications         Documented             aspirin: 81 mg, Chewed, Daily, 0 Refill(s).             calcium carbonate: 600 mg, 1 tab(s), PO, Daily, 0 Refill(s).             enzalutamide: 80 mg, 2 tab(s), PO, Daily, 0 Refill(s).             furosemide: 0 Refill(s).             gabapentin: 300 mg, 1 cap(s), PO, BID, 0 Refill(s).             losartan: 0 Refill(s).     Medications Inactivated in the Last 72 Hours         acetaminophen: 650 mg, 2 tab(s), PO, q4hr, PRN: Fever/Pain, Mild to           Moderate.         bupivacaine: 30 mL, PYXIS, Once.  ceFAZolin: 1 Gm, 50 mL, IV, Once.         dexamethasone: 4 mg, 1 mL, PYXIS, Once.         dexamethasone: 4 mg, 1 mL, IV, Once.         ePHEDrine: 5 mg, 0.1 mL, IV, Once.         ephedrine in Normal Saline: 25 mg, 5 mL, PYXIS, Once.         fentaNYL: 100 mcg, 2 mL, PYXIS, Once.         fentaNYL: 50 mcg, 1 mL, IV, Once.         fentaNYL: 25 mcg, 0.5 mL, IV, Once.         fentaNYL: 25 mcg, 0.5 mL, IV Push, q7min, PRN: Pain, Mild.         fentaNYL: 25 mcg, 0.5 mL, IV, Once.         Lactated Ringers: 650 mL, IV, Once.         lidocaine: 20 mg, 2 mL, PYXIS, Once.         lidocaine: 100 mg, 5 mL, PYXIS, Once.         lidocaine: 100 mg, 5 mL, PYXIS, Once.         lidocaine: 40 mg, 4 mL, IV, Once.         ondansetron: 4 mg, 2 mL, PYXIS, Once.         ondansetron: 4 mg, 2 mL, IV Push, q8hr, PRN: Nausea/Vomiting.         ondansetron: 4 mg, 2 mL, IV Piggyback, Once.         ondansetron: 4 mg, 2 mL, IV Push, q6hr, PRN: Nausea/Vomiting.         oxyCODONE: 5 mg, PO, as needed, 0 Refill(s).         propofol: 200 mg, 20 mL, PYXIS, Once.         propofol: 200 mg, 20 mL, IV, Once.        Physical Exam:  Gen: chronically-ill appearing elderly male  HEENT: anicteric sclera  CV: regular  Resp: breathing comfortably  Abd: soft, nontender  Ext: no edema    [facility] Labs (at Dr. Stevenson Clinch office):  09/13/19  PSA 0.04    Imaging:  10/05/19 bone scan  IMPRESSION:  Increased  radiotracer uptake in the sacrum may reflect residual known bony  metastatic disease. No definite uptake seen in the lower lumbar spine  corresponding to the metastatic deposits seen previously.    Increased radiotracer uptake in the lateral left sixth rib; a metastatic deposit  is not excluded.    Increased interstitial uptake at the right sternoclavicular joint could be due  to degenerative or inflammatory changes; however, metastatic deposit is not  excluded.    Additional scattered foci of. Tracer uptake which may be degenerative in  etiology.          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    He is tolerating Xtandi pretty well but is on only 40 mg daily. I advised him to try 80 mg daily and to use Imodium as needed for the loose stools. He is agreeable to trying this. For his back pain, I encouraged him to take Tylenol 1000 mng TID. Will continue him on Lupron and Xgeva. Will continue to trend PSA. Recent bone scan showed stable disease. He is not able  to afford CT scans.    Given his stage IV cancer, I will refer him to Palliative Care.     Patient is agreeable to plan.  He will return for follow-up in 3 months.   He knows to call with questions/concerns anytime.                      Reviewed and electronically verified by:  Kem Kays MD, Mendel Corning                                                                       on:  10/27/2019 23:59    Reviewed and electronically authenticated by:  Micheline Maze                                                                         on:  10/27/19 23:59

## 2019-10-27 NOTE — Other (Addendum)
 Patient:   [name], [name]            MRN: [id number]            FIN: [id number]               Age:   86 years     Sex:  Male     DOB:  14-Sep-1931   Associated Diagnoses:   None   Author:   Kem Kays MD, Mendel Corning          Chief Complaint: prostate cancer    Oncologic/hematologic History:  He was recently admitted to the hospital s/p fall down the stairs, resulting in a back injury.  Extensive work-up including CT head, CT spine and MRI spine was done.  It revealed evidence of metastatic disease to the back and a mild compression fracture of L2.  There was no evidence of spinal cord compression.  PSA was checked and it returned high at 245.  He was treated conservatively for his fracture and was started on pain meds.  He was then started on Lupron and Casodex, as well as Xgeva. Casodex was then discontinued and he remained on the other two. For his bony met in the back, he underwent kyphoplasty. Repeat PSAs had been low but in early 2021, PSA was noted to have increased to 7.1 in [place]. Restaging scans showed no visceral disease but worsening bony mets. Thus, in March 2021, he was started on Xtandi (reduced dose at 80 mg daily). He moved back to the [place] area in the summer.       Interval History:   Since his last visit here, he has been feeling okay. He is taking Xtandi but only 40 mg because of loose stools. Tolerating it pretty well, with no major side effects. Continues on Xgeva (monthly) and Lupron (q82months) as well.  Complains of back pain.      ROS:  Constitutional: Negative  HEENT: Negative  Respiratory: Negative  Cardiovascular: Negative  Gastrointestinal: Negative  Genitourinary: Negative  Heme/Lymph: Negative  Endocrine: hot flashes  Immunologic: Negative  Musculoskeletal: back pain  Integumentary: Negative  Neurologic: Negative  Psychiatric: Negative  All other ROS: Negative       Allergies:  Allergies (1) Active Reaction  NKA None Documented  ALLERGIES         Home Meds:    Medication List     Active Medications         Documented             aspirin: 81 mg, Chewed, Daily, 0 Refill(s).             calcium carbonate: 600 mg, 1 tab(s), PO, Daily, 0 Refill(s).             enzalutamide: 80 mg, 2 tab(s), PO, Daily, 0 Refill(s).             furosemide: 0 Refill(s).             gabapentin: 300 mg, 1 cap(s), PO, BID, 0 Refill(s).             losartan: 0 Refill(s).     Medications Inactivated in the Last 72 Hours         acetaminophen: 650 mg, 2 tab(s), PO, q4hr, PRN: Fever/Pain, Mild to           Moderate.         bupivacaine: 30 mL, PYXIS, Once.  ceFAZolin: 1 Gm, 50 mL, IV, Once.         dexamethasone: 4 mg, 1 mL, PYXIS, Once.         dexamethasone: 4 mg, 1 mL, IV, Once.         ePHEDrine: 5 mg, 0.1 mL, IV, Once.         ephedrine in Normal Saline: 25 mg, 5 mL, PYXIS, Once.         fentaNYL: 100 mcg, 2 mL, PYXIS, Once.         fentaNYL: 50 mcg, 1 mL, IV, Once.         fentaNYL: 25 mcg, 0.5 mL, IV, Once.         fentaNYL: 25 mcg, 0.5 mL, IV Push, q7min, PRN: Pain, Mild.         fentaNYL: 25 mcg, 0.5 mL, IV, Once.         Lactated Ringers: 650 mL, IV, Once.         lidocaine: 20 mg, 2 mL, PYXIS, Once.         lidocaine: 100 mg, 5 mL, PYXIS, Once.         lidocaine: 100 mg, 5 mL, PYXIS, Once.         lidocaine: 40 mg, 4 mL, IV, Once.         ondansetron: 4 mg, 2 mL, PYXIS, Once.         ondansetron: 4 mg, 2 mL, IV Push, q8hr, PRN: Nausea/Vomiting.         ondansetron: 4 mg, 2 mL, IV Piggyback, Once.         ondansetron: 4 mg, 2 mL, IV Push, q6hr, PRN: Nausea/Vomiting.         oxyCODONE: 5 mg, PO, as needed, 0 Refill(s).         propofol: 200 mg, 20 mL, PYXIS, Once.         propofol: 200 mg, 20 mL, IV, Once.        Physical Exam:  Gen: chronically-ill appearing elderly male  HEENT: anicteric sclera  CV: regular  Resp: breathing comfortably  Abd: soft, nontender  Ext: no edema    OSH Labs (at Dr. Stevenson Clinch office):  09/13/19  PSA 0.04    Imaging:  10/05/19 bone scan  IMPRESSION:  Increased  radiotracer uptake in the sacrum may reflect residual known bony  metastatic disease. No definite uptake seen in the lower lumbar spine  corresponding to the metastatic deposits seen previously.    Increased radiotracer uptake in the lateral left sixth rib; a metastatic deposit  is not excluded.    Increased interstitial uptake at the right sternoclavicular joint could be due  to degenerative or inflammatory changes; however, metastatic deposit is not  excluded.    Additional scattered foci of. Tracer uptake which may be degenerative in  etiology.          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    He is tolerating Xtandi pretty well but is on only 40 mg daily. I advised him to try 80 mg daily and to use Imodium as needed for the loose stools. He is agreeable to trying this. For his back pain, I encouraged him to take Tylenol 1000 mng TID. Will continue him on Lupron and Xgeva. Will continue to trend PSA. Recent bone scan showed stable disease. He is not able  to afford CT scans.    Given his stage IV cancer, I will refer him to Palliative Care.     Patient is agreeable to plan.  He will return for follow-up in 3 months.   He knows to call with questions/concerns anytime.                      Reviewed and electronically verified by:  Kem Kays MD, Mendel Corning                                                                       on:  10/27/2019 23:59    Reviewed and electronically authenticated by:  Micheline Maze                                                                         on:  10/27/19 23:59

## 2019-10-27 NOTE — Other (Deleted)
 [email or web address] Nurse-[place] Intake Entered On:  10/27/2019 13:34     Performed On:  10/27/2019 13:34  by [name]               Vital Signs   Numeric Pain Scale Score :   0    Peripheral Pulse Rate :   69 bpm   Respiratory Rate :   16 br/min   Systolic Blood Pressure :   157 mmHg (HI)    Diastolic Blood Pressure :   90 mmHg   Blood Pressure Extremity :   Right arm   Mean Arterial Pressure :   112 mmHg   SpO2 :   97 %   Oxygen Therapy 1 :   Room air   [name] - 10/27/2019 13:37    Pain Scale Used :   0-10   [name] - 10/27/2019 13:34    Height/Weight   Weight :   82.63 Kg(Converted to: 182 lb 3 oz)    Type of Weight :   Standing   Type of Height :   Stated height   [name] - 10/27/2019 13:37    [facility] General Information/Subjective   Chief Complaint :   follow up   High Risk for Falls :   No   Preferred Language :   English   Preferred Communication Mode :   Verbal   Pain Symptoms :   No   [name] - 10/27/2019 13:37    Patient Preferred Method of Communication   Phone Call   [facility] Infectious Disease Risk Screening   Patient Travel History :   No recent travel   Recent Family Travel History :   No recent travel   Close contact w/ suspect case COVID-19 :   No   Close contact w/ lab-confirmed case COVID-19 :   No   Lab confirmed positive COVID-19 test result? :   No   Recently exposed to COVID-19? :   No   [name] - 10/27/2019 13:37    Allergy   (As Of: 10/27/2019 13:39:56 )   Allergies (Active)   NKA  Estimated Onset Date:   Unspecified ; Created By:   Ronnie Doss; Reaction Status:   Active ; Category:   Drug ; Substance:   NKA ; Type:   Allergy ; Updated By:   Ronnie Doss; Reviewed Date:   10/27/2019 13:39         Medication History   Medication List   (As Of: 10/27/2019 13:39:56 )   Home Meds    enzalutamide  :   enzalutamide ; Status:   Documented ; Ordered As Mnemonic:   Xtandi 40 mg oral tablet ; Simple Display Line:   80 mg, 2 tab(s), PO, Daily, 0 Refill(s) ; Catalog Code:    enzalutamide ; Order Dt/Tm:   09/21/2019 13:53:12           furosemide  :   furosemide ; Status:   Documented ; Ordered As Mnemonic:   furosemide 40 mg oral tablet ; Simple Display Line:   0 Refill(s) ; Catalog Code:   furosemide ; Order Dt/Tm:   09/21/2019 13:53:19           losartan  :   losartan ; Status:   Documented ; Ordered As Mnemonic:   losartan 50 mg oral tablet ; Simple Display Line:   0 Refill(s) ; Catalog Code:   losartan ; Order Dt/Tm:   09/21/2019 13:53:19  calcium carbonate  :   calcium carbonate ; Status:   Documented ; Ordered As Mnemonic:   calcium (as carbonate) 600 mg oral tablet ; Simple Display Line:   600 mg, 1 tab(s), PO, Daily, 0 Refill(s) ; Catalog Code:   calcium carbonate ; Order Dt/Tm:   10/02/2017 17:18:41           oxyCODONE  :   oxyCODONE ; Status:   Completed ; Ordered As Mnemonic:   oxyCODONE ; Simple Display Line:   5 mg, PO, as needed, 0 Refill(s) ; Catalog Code:   oxyCODONE ; Order Dt/Tm:   09/29/2017 15:24:05           aspirin  :   aspirin ; Status:   Documented ; Ordered As Mnemonic:   aspirin ; Simple Display Line:   81 mg, Chewed, Daily, 0 Refill(s) ; Catalog Code:   aspirin ; Order Dt/Tm:   07/18/2017 08:40:53           gabapentin  :   gabapentin ; Status:   Documented ; Ordered As Mnemonic:   gabapentin 300 mg oral capsule ; Simple Display Line:   300 mg, 1 cap(s), PO, BID, 0 Refill(s) ; Catalog Code:   gabapentin ; Order Dt/Tm:   07/12/2017 18:01:00             [facility] Social History   Cigarette Smoking Last 365 Days :   No   Exposure to Tobacco Smoke :   Former smoker   Patient used other tobacco products in the last 30 days? :   No   [name] - 10/27/2019 13:37    Social History   (As Of: 10/27/2019 13:39:56 )   Tobacco:        Former smoker   (Last Updated: 10/19/2015 07:51:16  by Konrad Penta RN, Johnny Bridge)          Alcohol:        None   (Last Updated: 10/19/2015 07:51:23  by Konrad Penta RN, Johnny Bridge)          Substance Use:        Never   (Last Updated: 10/19/2015 07:51:50   by Konrad Penta RN, Johnny Bridge)          Home/Environment:        Lives with Children.   Comments:  08/14/2017 11:49 - Margaretha Glassing RN, [name]: lives with daughter and brother has 7 children   (Last Updated: 08/14/2017 11:49:19  by Margaretha Glassing RN, [name])          Nutrition:        Regular   (Last Updated: 08/14/2017 11:41:02  by Margaretha Glassing RN, [name])          Employment/School:        Retired   (Last Updated: 08/14/2017 11:40:49  by Margaretha Glassing RN, [name])            Advance Directive   Advanced Directives :   Yes   [name] - 10/27/2019 13:36    [facility] Encounter   Onc Type of Patient :   Outpatient Visit Existing   Onc Visit Level, Existing :   Level 1   [name] - 10/27/2019 13:36

## 2019-11-07 ENCOUNTER — Encounter: Payer: Self-pay | Admitting: Anesthesiology

## 2019-11-07 ENCOUNTER — Ambulatory Visit: Payer: Medicare Other | Attending: Pain Medicine | Admitting: Anesthesiology

## 2019-11-07 ENCOUNTER — Other Ambulatory Visit: Payer: Self-pay

## 2019-11-07 DIAGNOSIS — G893 Neoplasm related pain (acute) (chronic): Secondary | ICD-10-CM | POA: Diagnosis not present

## 2019-11-07 DIAGNOSIS — C7951 Secondary malignant neoplasm of bone: Secondary | ICD-10-CM

## 2019-11-07 DIAGNOSIS — S32020S Wedge compression fracture of second lumbar vertebra, sequela: Secondary | ICD-10-CM

## 2019-11-07 DIAGNOSIS — M545 Low back pain, unspecified: Secondary | ICD-10-CM

## 2019-11-07 DIAGNOSIS — M48061 Spinal stenosis, lumbar region without neurogenic claudication: Secondary | ICD-10-CM

## 2019-11-07 DIAGNOSIS — G8929 Other chronic pain: Secondary | ICD-10-CM

## 2019-11-07 DIAGNOSIS — C61 Malignant neoplasm of prostate: Secondary | ICD-10-CM

## 2019-11-07 DIAGNOSIS — G894 Chronic pain syndrome: Secondary | ICD-10-CM

## 2019-11-07 DIAGNOSIS — M5136 Other intervertebral disc degeneration, lumbar region: Secondary | ICD-10-CM

## 2019-11-07 MED ORDER — OXYCODONE HCL 5 MG PO TABS: 5.0000 mg | ORAL_TABLET | Freq: Three times a day (TID) | ORAL | 0 refills | Status: DC

## 2019-11-07 NOTE — Progress Notes (Signed)
Virtual Visit via Telephone Note  I connected with Logan Brown. on 11/07/19 at  1:15 PM EDT by telephone and verified that I am speaking with the correct person using two identifiers.  Location: Patient: Home Provider: Pain control center   I discussed the limitations, risks, security and privacy concerns of performing an evaluation and management service by telephone and the availability of in person appointments. I also discussed with the patient that there may be a patient responsible charge related to this service. The patient expressed understanding and agreed to proceed.   History of Present Illness: I spoke with [name] via telephone today.  We were unable to link up with the video portion of the virtual conference but he reports that he has been having a considerable amount of low back pain and diffuse body pain.  This is a syndrome consistent with what he has experienced over the past few years.  He has been out of his opioid recently.  He took this 3 times a day and was followed by Dr. Dossie Brown.  As Dr. Dossie Brown is out of the clinic at this point I have been asked to follow-up with [name] to assist with his medication management.  He reports that when he was taking the 5 mg tablets of oxycodone he would get considerable improvement in his low back pain and this would keep the pain tolerable.  He continues to follow along with his primary care physicians and oncologist for his cancer and medical baseline management.  In discussion today he reports that he derive good functional benefit from the opioid protocol with no side effects reported.  He would like to reinitiate this as soon as possible secondary to some persistence of his low back pain.  He is due to see his oncology doctors within the next month for additional pharmaceutical regimen for his prostate cancer   Observations/Objective:  Current Outpatient Medications:  .  aspirin EC 81 MG tablet, Take 81 mg by mouth  daily., Disp: , Rfl:  .  calcium carbonate (OSCAL) 1500 (600 Ca) MG TABS tablet, Take 600 mg of elemental calcium by mouth 2 (two) times daily with a meal., Disp: , Rfl:  .  Cholecalciferol (VITAMIN D3) 125 MCG (5000 UT) CAPS, Take 5,000 Units by mouth daily. , Disp: , Rfl:  .  enzalutamide (XTANDI) 40 MG capsule, Take 2 capsules (80 mg total) by mouth daily., Disp: 60 capsule, Rfl: 1 .  furosemide (LASIX) 40 MG tablet, Take 40 mg by mouth every Monday, Wednesday, and Friday., Disp: , Rfl:  .  gabapentin (NEURONTIN) 600 MG tablet, Take 1/2 to 1 tablet 2 to 3 x /day for chronic pain, Disp: 270 tablet, Rfl: 1 .  hydrOXYzine (ATARAX/VISTARIL) 25 MG tablet, Take 1 tablet 2 to 3 x /day if needed for Nerves or Anxiety (Patient taking differently: Take 25 mg by mouth See admin instructions. Take 25 mg by mouth two to three times a day as needed for "nerves or anxiety"), Disp: 90 tablet, Rfl: 0 .  labetalol (NORMODYNE) 100 MG tablet, Take 50-100 mg by mouth See admin instructions. Take 50 mg by mouth in the morning and 100 mg at bedtime, Disp: , Rfl:  .  losartan (COZAAR) 100 MG tablet, Take 1 tablet Daily for BP (Patient taking differently: Take 100 mg by mouth daily. ), Disp: 90 tablet, Rfl: 0 .  Multiple Vitamins-Minerals (CENTRUM SILVER ADULT 50+ PO), Take 1 tablet by mouth 3 (three) times a week. ,  Disp: , Rfl:  .  omeprazole (PRILOSEC) 20 MG capsule, Take 1 capsule Daily for Heartburn & Indigestion (Patient taking differently: Take 20 mg by mouth daily before breakfast. ), Disp: 90 capsule, Rfl: 1 .  oxyCODONE (OXY IR/ROXICODONE) 5 MG immediate release tablet, Take 1 tablet (5 mg total) by mouth every 8 (eight) hours as needed for severe pain. Must last 30 days. (Patient taking differently: Take 5 mg by mouth every 8 (eight) hours. ), Disp: 90 tablet, Rfl: 0 .  [START ON 12/07/2019] oxyCODONE (ROXICODONE) 5 MG immediate release tablet, Take 1 tablet (5 mg total) by mouth 3 (three) times daily., Disp: 90  tablet, Rfl: 0 .  oxyCODONE (ROXICODONE) 5 MG immediate release tablet, Take 1 tablet (5 mg total) by mouth 3 (three) times daily., Disp: 90 tablet, Rfl: 0 .  simethicone (GAS RELIEF EXTRA STRENGTH) 125 MG chewable tablet, Chew 125 mg by mouth every 6 (six) hours as needed for flatulence (CHERRY (red))., Disp: , Rfl:   Assessment and Plan: 1. Chronic pain syndrome   2. Cancer-related pain   3. Metastatic cancer to spine ([place])   4. Chronic low back pain (Primary Area of Pain) (Bilateral) (R>L) w/o sciatica   5. 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 ([place])   6. Compression fracture of L2 lumbar vertebra, sequela   7. Prostate cancer ([place])   8. 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 DDD (degenerative disc disease), lumbar   10. Lumbar central spinal stenosis, w/o neurogenic claudication   I had a long discussion with [name] regarding Percocet and the risks and benefits of opioid management.  I have reviewed the [facility] practitioner database information and it is appropriate for refill.  He is aware that we will be filling his opioid medications and that he is not to receive these from any other source.  He will continue follow-up with his oncology physicians for his prostate cancer management.  I am going to fill his prescriptions for the next 2 months which will be dated for August 30 and September 29 for 5 mg tablets to be taken 3 times a day.  Should he have any problems with the medication he is instructed on how to contact us at the pain control center he voices understanding with this protocol.  He is also to continue follow-up with Dr. Dossie Brown with a schedule return appointment in 2 months.  Follow Up Instructions:    I discussed the assessment and treatment plan with the patient. The patient was provided an opportunity to ask questions and all were answered. The patient agreed with the plan and demonstrated an understanding of  the instructions.   The patient was advised to call back or seek an in-person evaluation if the symptoms worsen or if the condition fails to improve as anticipated.  I provided 30 minutes of non-face-to-face time during this encounter.   Molli Barrows, MD

## 2019-11-08 ENCOUNTER — Telehealth: Payer: Self-pay | Admitting: Anesthesiology

## 2019-11-08 NOTE — Telephone Encounter (Signed)
Patient called to fill his script and the pharmacy told him they do not have one for him. Please call his pharmacy and check on this. It looks like Dr. Andree Elk sent 2 scripts yesterday.

## 2019-11-09 ENCOUNTER — Ambulatory Visit: Payer: Medicare Other | Admitting: Pain Medicine

## 2019-11-15 ENCOUNTER — Other Ambulatory Visit: Payer: Self-pay | Admitting: Adult Health

## 2019-11-15 DIAGNOSIS — C7951 Secondary malignant neoplasm of bone: Secondary | ICD-10-CM

## 2019-11-22 ENCOUNTER — Ambulatory Visit: Admitting: Urology

## 2019-11-22 NOTE — Progress Notes (Signed)
* * *      [name], [name] **DOB:** [birth year] (86 yo M) **Acc No.** [id number] **DOS:**  11/22/2019    ---       **[name]**    ------    45 Y old Male, DOB: [birth year]    [address] [place], [place], [place] [address]    Home: [phone]    Provider: [name]        * * *    Telephone Encounter    ---    Answered by  Alene Mires Date: 11/22/2019       Time: 10:22 AM    Reason  [name] Refill    ------            Message                     Received Fax from [facility] notifying us that they have made several attempts to contact the pt to fill the medication Xtandi Tab 80mg .                Action Taken                     Giarla,Robyn  11/22/2019 10:23:37 AM > Called patient and reached an answering machine. Lmom Xtandi refill sent                Refills Refill Xtandi Tablet, 80 MG, Orally, 60 Tablet, 1 tablet, twice a  day, 30 day(s), Refills=5    ------          * * *                ---          * * *        Provider: [name] 11/22/2019    ---    Note generated by eClinicalWorks EMR/PM Software ([email or web address])

## 2019-11-27 ENCOUNTER — Ambulatory Visit (HOSPITAL_BASED_OUTPATIENT_CLINIC_OR_DEPARTMENT_OTHER)

## 2019-11-29 ENCOUNTER — Other Ambulatory Visit: Payer: Self-pay | Admitting: Internal Medicine

## 2019-11-29 DIAGNOSIS — I10 Essential (primary) hypertension: Secondary | ICD-10-CM

## 2019-11-29 DIAGNOSIS — I480 Paroxysmal atrial fibrillation: Secondary | ICD-10-CM

## 2019-11-29 MED ORDER — LABETALOL HCL 100 MG PO TABS: ORAL_TABLET | ORAL | 0 refills | Status: DC

## 2019-12-02 ENCOUNTER — Ambulatory Visit: Admitting: Urology

## 2019-12-02 NOTE — Progress Notes (Signed)
* * *      [name], [name] **DOB:** March 02, 1932 (86 yo M) **Acc No.** [id number] **DOS:**  12/02/2019    ---       **[name]**    ------    41 Y old Male, DOB: Aug 02, 1931    [address] [place], [place], [place] [address]    Home: [phone]    Provider: [name]        * * *    Telephone Encounter    ---    Answered by  Wendelyn Breslow Date: 12/02/2019       Time: 09:26 AM    Caller  daughter    ------            Reason  fyi            Message                     Patient's daughter called to cancel [facility] appt. Patient is i [place] and she's not sure when he is returning. Daughter will call to r/s when he returns to [place]                Action Taken                     Morse,Linda  12/02/2019 9:34:04 AM >      Alaysia Lightle E 12/02/2019 9:46:55 AM > will he be seeing a GU doc there..( he needs Lupron) please call the daughter.. thanks      DaSilva,Brittany  12/02/2019 9:53:32 AM > called  daughter no answer- lmom      DaSilva,Brittany  12/02/2019 2:04:07 PM > tried to call daughter again, no answer. lmomx2      DaSilva,Brittany  12/02/2019 3:18:51 PM > fyi only i have tried to call her 4x today and no response or call back.      DaSilva,Brittany  12/02/2019 3:26:11 PM > finally spoke with daughter- states she has no idea what he is doing- not sure if he is going to follow up out there- i advised her of the importance of keeping up with the Lupron- she said she will talk to him and let us know.                    * * *                ---          * * *        Provider: [name] 12/02/2019    ---    Note generated by eClinicalWorks EMR/PM Software ([email or web address])

## 2019-12-06 ENCOUNTER — Telehealth: Payer: Self-pay | Admitting: Oncology

## 2019-12-06 ENCOUNTER — Ambulatory Visit: Admitting: Urology

## 2019-12-06 NOTE — Progress Notes (Signed)
 * * *    [name], [name] **DOB:** 14-Nov-1931 (86 yo M) **Acc No.** [id number] **DOS:**  12/06/2019    ---       **[name]**    ------    53 Y old Male, DOB: [birth year]    Account Number: [id number]    [address] [place], [place], [id number]    Home: [phone]    Guarantor: [name], [name] Insurance: AARP Medicare Complete Payer ID:  [id number]    PCP: [facility]    Appointment Facility: [facility].        * * *    12/06/2019 Progress Notes: [name], [name]    ------    ---       **Current Medications**    ---    Taking    * Vitamin B12     ---    * Furosemide 40 MG Tablet 1 tablet Orally Once a day    ---    * Losartan Potassium 100 MG Tablet 1 tablet Orally Once a day    ---    * Omeprazole 20 MG Capsule Delayed Release 1 capsule Orally Once a day    ---    * Gabapentin 300 MG Capsule 1 capsule Orally Once a day    ---    * Aspirin 81 MG Tablet Delayed Release 1 tablet Orally Once a day    ---    * Calcium     ---    * Xtandi 80 MG Tablet 1 tablet Orally twice a day    ---    Not-Taking/PRN    * Megace Oral     ---    * Acetaminophen 500 MG Capsule 1 capsule as needed Orally every 6 hrs    ---    * Lisinopril 5 MG Tablet 1 tablet Orally Once a day    ---    * Metoprolol Succinate ER 25 MG Tablet Extended Release 24 Hour 1 tablet Orally Once a day    ---    * MiraLax - Powder as directed Orally     ---    * oxyCODONE HCl 5 MG/5ML Solution 5 ml as needed Orally every 6 hrs    ---    * Milk of Magnesia 400 MG/5ML Suspension 5 ml as needed Orally Four times a day    ---    * Bisacodyl 10 MG Suppository 1 suppository as needed Rectal Once a day    ---    * Casodex 50 MG Tablet 1 tablet Orally Once a day    ---    * Centrum Silver Tablet as directed Orally     ---    * hydroCHLOROthiazide 25 MG Tablet 1 tablet Orally Once a day    ---    * Xanax 0.25 MG Tablet 1 tablet Orally Twice a day    ---    * HYDROcodone-Acetaminophen 5-325 MG Tablet 1 tablet as  needed Orally every 6 hrs    ---    * Amiodarone HCl 200 MG Tablet 1 tablet Orally Once a day    ---    * Zestril 5 MG Tablet 1 tablet Orally Once a day    ---    * Zocor 10 MG Tablet 1 tablet every evening Orally Once a day    ---    * Xarelto 15 MG Tablet Orally     ---     Past Medical History    ---  prostate cancer: Dx'd 7/06; S/P XRT: 11/06-1/07: gleason's 8 and then Dx'd  with met prostate cancer in 07/2017: Lupron on/off.        ---    GERD.        ---    Hypercholesterolemia.        ---    Hypertension.        ---    Anxiety.        ---    Atrial Fibrillation: @ 2017.        ---    ED - Impotence of organic origin.        ---    Renal insufficiency.        ---    COVID Vacc: Pfizer 05/2019.        ---      **Surgical History**    ---      Trans Rectal [place] and Prostate BX 09/2004    ---    cholecystectomy 04/2004    ---    inguinal hernia repair    ---    cystoscopy: c/w rad changes 08/27/2016    ---    cardioversion    ---    circumcision 10/24/2019    ---      **Hospitalization/Major Diagnostic Procedure**    ---      fall/compressionfx c/we met disease 5/11-5/15/19    ---       **Reason for Appointment**    ---      1\. Prostate cancer follow up    ---    2\. Post-op    ---      **History of Present Illness**    ---     _Urological History_ :    the pt is here for prostate cancer follow up as well as he is post op a  circumcision:.      **Treatment**    ---      **1\. Others**    Notes: pt cancelled: see telephone encounters.    ---    Electronically signed by Leola Brazil , MD on 12/06/2019 at 01:31 PM EDT    Sign off status: Completed        * * [facility], [facility].    [address]    [place], [place] [address]    Tel: [phone]    Fax: [phone]              * * *          Progress Note: [name], [name] 12/06/2019    ---    Note generated by eClinicalWorks EMR/PM Software ([email or web address])

## 2019-12-06 NOTE — Telephone Encounter (Signed)
Called pt back per 9/27 sch msg - scheduled appt on 10/12 per [facility] Staff message. Left message for patient with appt date and time on vmail.

## 2019-12-08 NOTE — Progress Notes (Signed)
Virtual Visit via Telephone Note   This visit type was conducted due to national recommendations for restrictions regarding the COVID-19 Pandemic (e.g. social distancing) in an effort to limit this patient's exposure and mitigate transmission in our community.  Due to his co-morbid illnesses, this patient is at least at moderate risk for complications without adequate follow up.  This format is felt to be most appropriate for this patient at this time.  The patient did not have access to video technology/had technical difficulties with video requiring transitioning to audio format only (telephone).  All issues noted in this document were discussed and addressed.  No physical exam could be performed with this format.  Please refer to the patient's chart for his  consent to telehealth for [facility].   Date:  12/09/2019   ID:  Logan Miyamoto., DOB 04/13/1931, MRN [id number]  Patient Location: Home Provider Location: Home Office  PCP:  [name], MD  Cardiologist:  Minus Breeding, MD  Electrophysiologist:  None   Evaluation Performed:  Follow-Up Visit  Chief Complaint Fatigue   History of Present Illness:    Logan Biggar. is a 86 y.o. male we are following for ongoing assessment and management of cardiomyopathy, hypertension, atrial fibrillation, with other history to include carotid artery stenosis, chronic dyspnea, chronic kidney disease stage III.  Last seen in the office by Dr. Percival Spanish on 04/05/2019.  The patient was no longer on Xarelto as he had hematuria and therefore this was stopped.  He was continued on amiodarone but apparently this had been discontinued prior to being seen again by Dr. Percival Spanish.  On the last office visit the patient states that he was doing well.  He was limited by chronic back pain.  Dr. Percival Spanish noted that the patient takes a lot of long distance walking but has not had any significant shortness of breath associated with that.  No changes  were made to his medication regimen.  He comes today via telephone visit with complaints of worsening fatigue and some shortness of breath.  He states that he does not walk as far as he used to because he gets tired and has to stop.  He also states that when he washes his car he has to stop halfway and rest and then go back to it.  He denies any chest pressure, palpitations, lightheadedness or dizziness.  He also states that he has chronic back pain and is also being treated for osteosarcoma.   The patient does not have symptoms concerning for COVID-19 infection (fever, chills, cough, or new shortness of breath).    Past Medical History:  Diagnosis Date  . Arthritis   . Atrial fibrillation ([place])   . Bone cancer ([place])   . Cardiomyopathy ([place])   . Chronic kidney disease   . Hyperlipidemia   . Hypertension   . Prostate cancer ([place]) 1997  . Unstable gait 04/16/2018   unstable gait in home   Past Surgical History:  Procedure Laterality Date  . CATARACT EXTRACTION    . EYE SURGERY Bilateral    IOL/CE on Lt in 1998 and Rt in 2011.  [name] Kitchen PROSTATE SURGERY    . WRIST FOREIGN BODY REMOVAL     1957 glass     Current Meds  Medication Sig  . aspirin EC 81 MG tablet Take 81 mg by mouth daily.  . calcium carbonate (OSCAL) 1500 (600 Ca) MG TABS tablet Take 600 mg of elemental calcium by mouth 2 (two)  times daily with a meal.  . Cholecalciferol (VITAMIN D3) 125 MCG (5000 UT) CAPS Take 5,000 Units by mouth daily.   . enzalutamide (XTANDI) 40 MG capsule Take 2 capsules (80 mg total) by mouth daily.  . furosemide (LASIX) 40 MG tablet Take 40 mg by mouth every Monday, Wednesday, and Friday.  . gabapentin (NEURONTIN) 600 MG tablet TAKE 1/2 TO 1 (ONE-HALF TO ONE) TABLET BY MOUTH 2 TO 3 TIMES DAILY FOR  CHRONIC  PAIN  . hydrOXYzine (ATARAX/VISTARIL) 25 MG tablet Take 1 tablet 2 to 3 x /day if needed for Nerves or Anxiety (Patient taking differently: Take 25 mg by mouth See admin instructions. Take 25 mg by  mouth two to three times a day as needed for "nerves or anxiety")  . labetalol (NORMODYNE) 100 MG tablet Take    1 tablet      2 x /day      for BP & Heart  . losartan (COZAAR) 100 MG tablet Take 1 tablet Daily for BP (Patient taking differently: Take 100 mg by mouth daily. )  . omeprazole (PRILOSEC) 20 MG capsule Take 1 capsule Daily for Heartburn & Indigestion (Patient taking differently: Take 20 mg by mouth daily before breakfast. )  . oxyCODONE (OXY IR/ROXICODONE) 5 MG immediate release tablet Take 1 tablet (5 mg total) by mouth every 8 (eight) hours as needed for severe pain. Must last 30 days. (Patient taking differently: Take 5 mg by mouth every 8 (eight) hours. )  . simethicone (GAS RELIEF EXTRA STRENGTH) 125 MG chewable tablet Chew 125 mg by mouth every 6 (six) hours as needed for flatulence (CHERRY (red)).  . [DISCONTINUED] Multiple Vitamins-Minerals (CENTRUM SILVER ADULT 50+ PO) Take 1 tablet by mouth 3 (three) times a week.      Allergies:   Ace inhibitors and Hydrocodone   Social History   Tobacco Use  . Smoking status: Former Smoker    Quit date: 03/14/1975    Years since quitting: 44.7  . Smokeless tobacco: Never Used  Vaping Use  . Vaping Use: Never used  Substance Use Topics  . Alcohol use: No    Alcohol/week: 0.0 standard drinks    Comment: occ  . Drug use: No     Family Hx: The patient's family history is not on file.  ROS:   Please see the history of present illness.    All other systems reviewed and are negative.   Prior CV studies:   The following studies were reviewed today:    Labs/Other Tests and Data Reviewed:    EKG:  No ECG reviewed.  Recent Labs: 04/27/2019: Magnesium 2.2; TSH 3.06 08/03/2019: ALT 15; BUN 27; Creatinine 1.65; Hemoglobin 12.0; Platelet Count 143; Potassium 4.4; Sodium 140   Recent Lipid Panel Lab Results  Component Value Date/Time   CHOL 235 (H) 04/27/2019 03:14 PM   TRIG 487 (H) 04/27/2019 03:14 PM   HDL 43 04/27/2019  03:14 PM   CHOLHDL 5.5 (H) 04/27/2019 03:14 PM   [place]  04/27/2019 03:14 PM     Comment:     . LDL cholesterol not calculated. Triglyceride levels greater than 400 mg/dL invalidate calculated LDL results. . Reference range: <100 . Desirable range <100 mg/dL for primary prevention;   <70 mg/dL for patients with CHD or diabetic patients  with > or = 2 CHD risk factors. [name] Kitchen LDL-C is now calculated using the Martin-Hopkins  calculation, which is a validated novel method providing  better accuracy than the Friedewald equation  in the  estimation of LDL-C.  Cresenciano Genre et al. Annamaria Helling. 6045;409(81): 2061-2068  (http://education.[employer].com/faq/FAQ164)     Wt Readings from Last 3 Encounters:  12/09/19 183 lb (83 kg)  08/03/19 182 lb 3.2 oz (82.6 kg)  06/02/19 188 lb 4.8 oz (85.4 kg)     Objective:    Vital Signs:  BP (!) 170/90   Pulse 63   Ht 5\' 7"  (1.702 m)   Wt 183 lb (83 kg)   BMI 28.66 kg/m  Limited due to telephone visit. VITAL SIGNS:  reviewed GEN:  no acute distress RESPIRATORY:  normal respiratory effort, symmetric expansion NEURO:  alert and oriented x 3, no obvious focal deficit PSYCH:  normal affect  ASSESSMENT & PLAN:    1.  Atrial fibrillation: Remains on Xarelto without any complaints of bleeding bruising.  I am concerned about his fatigue.  He is being followed by oncology and is having labs drawn.  With diagnosis of osteosarcoma I would like to know more about his status concerning anemia on this antiplatelet medication.  Otherwise he is compliant with his medications, tries to walk but has not been able to walk as far due to fatigue.  He has some shortness of breath associated with exertion.  My plan will be to schedule this gentleman for an echocardiogram to evaluate for changes in LV systolic function with changes reported in his stamina and inability to walk very far without dyspnea.  He denies it is related to his chronic back pain.  2.   Cardiomyopathy: Repeating echo.  Continue losartan, metoprolol.  3.  Hypertension: He has not had any episodes of hypotension as he had in the past.  No changes in his medication regimen currently.  4.  Osteosarcoma: Followed by oncology.  He is being seen fairly often and has had blood work drawn.  Will request labs.  COVID-19 Education: The signs and symptoms of COVID-19 were discussed with the patient and how to seek care for testing (follow up with PCP or arrange E-visit).  The importance of social distancing was discussed today.  Time:   Today, I have spent 30 minutes with the patient with telehealth technology discussing the above problems.     Medication Adjustments/Labs and Tests Ordered: Current medicines are reviewed at length with the patient today.  Concerns regarding medicines are outlined above.   Tests Ordered: Orders Placed This Encounter  Procedures  . ECHOCARDIOGRAM COMPLETE    Medication Changes: No orders of the defined types were placed in this encounter.   Disposition:  Follow up one month in person after echocardiogram .   Signed, Phill [name]. [place] Pugh, ANP, AACC  12/09/2019 10:12 AM    [facility]

## 2019-12-09 ENCOUNTER — Telehealth (INDEPENDENT_AMBULATORY_CARE_PROVIDER_SITE_OTHER): Payer: Medicare Other | Admitting: Adult Health

## 2019-12-09 ENCOUNTER — Encounter: Payer: Self-pay | Admitting: Adult Health

## 2019-12-09 VITALS — BP 170/90 | HR 63 | Ht 67.0 in | Wt 183.0 lb

## 2019-12-09 DIAGNOSIS — C7951 Secondary malignant neoplasm of bone: Secondary | ICD-10-CM | POA: Diagnosis not present

## 2019-12-09 DIAGNOSIS — I5032 Chronic diastolic (congestive) heart failure: Secondary | ICD-10-CM | POA: Diagnosis not present

## 2019-12-09 DIAGNOSIS — R0602 Shortness of breath: Secondary | ICD-10-CM

## 2019-12-09 DIAGNOSIS — I43 Cardiomyopathy in diseases classified elsewhere: Secondary | ICD-10-CM

## 2019-12-09 DIAGNOSIS — I482 Chronic atrial fibrillation, unspecified: Secondary | ICD-10-CM

## 2019-12-09 NOTE — Patient Instructions (Signed)
Medication Instructions:  Continue current medications  *If you need a refill on your cardiac medications before your next appointment, please call your pharmacy*   Lab Work: None Ordered   Testing/Procedures: Your physician has requested that you have an echocardiogram. Echocardiography is a painless test that uses sound waves to create images of your heart. It provides your doctor with information about the size and shape of your heart and how well your heart's chambers and valves are working. This procedure takes approximately one hour. There are no restrictions for this procedure.   Follow-Up: At [facility], you and your health needs are our priority.  As part of our continuing mission to provide you with exceptional heart care, we have created designated Provider Care Teams.  These Care Teams include your primary Cardiologist (physician) and Advanced Practice Providers (APPs -  Physician Assistants and Nurse Practitioners) who all work together to provide you with the care you need, when you need it.  We recommend signing up for the patient portal called "MyChart".  Sign up information is provided on this After Visit Summary.  MyChart is used to connect with patients for Virtual Visits (Telemedicine).  Patients are able to view lab/test results, encounter notes, upcoming appointments, etc.  Non-urgent messages can be sent to your provider as well.   To learn more about what you can do with MyChart, go to [email or web address].    Your next appointment:   Tuesday November 2nd @ 1:40 pm  The format for your next appointment:   In Person  Provider:   Minus Breeding, MD

## 2019-12-14 ENCOUNTER — Other Ambulatory Visit: Payer: Medicare Other | Admitting: Hospice

## 2019-12-14 ENCOUNTER — Other Ambulatory Visit: Payer: Self-pay

## 2019-12-14 DIAGNOSIS — G893 Neoplasm related pain (acute) (chronic): Secondary | ICD-10-CM

## 2019-12-14 DIAGNOSIS — Z515 Encounter for palliative care: Secondary | ICD-10-CM

## 2019-12-14 NOTE — Progress Notes (Signed)
[facility] Consult Note Telephone: [phone]  Fax: [phone]  PATIENT NAME: Logan Brown. DOB: August 09, 1931 MRN: [id number]  PRIMARY CARE PROVIDER:   [name], MD [name], [address] [place],  [place] [address]  REFERRING PROVIDER: [name], [place], [place], [place],  [place] [address]  [facility] Contact Information Primary Emergency Contact: [name] [place] Mobile Phone: [phone] Relation: Friend   RECOMMENDATIONS/PLAN:  1. Advance Care Planning/Goals of Care: Visit consisted of building trust and discussions on Palliative Medicine as specialized medical care for people living with serious illness, aimed at facilitating better quality of life through symptoms relief, assisting with advance care plan and establishing goals of care.    CODE STATUS: Reviewed CODE STATUS, its implications and ramifications.  Patient remains remains a DO NOT RESUSCITATE.    GOALS of care: Goalsof care includingto maximize quality of life and symptomrelief.  Signed MOST form in epic; selections include DO NOT RESUSCITATE, limited additional intervention, IV fluids/antibiotics if indicated, no feeding tube.  Follow up: Palliative care will continue to follow patient for goals of care clarification and symptom management.  Follow-up in 2 months.   Symptom management:  Patient reports chronic pain at lower back related to metastatic prostate cancer to the bone.  He has oxycodone for pain; he reports he takes it about once or twice a day, ordered for 5mg  TID PRN pain. Patient is also on Gabapentin.   Telephonic visit with Cardiologist 10/1/2021for ongoing fatigue and weakness. Echocardiogram scheduled for this month. No changes to heart medications at this time. Patient said he is feeling ok  and gets tired when he goes for long walks outside his home or exerts himself a lot.. Pacing and rest in between activities encouraged. Appetite is fair and continues on regular diet.   He continues on Furosemide for CHF. He continues with Labetalol and Losartan for BP.   Syncopal episode  02/21/2019, 05/23/2019; he reports  none since then. Patient said overall he is doing well and his back pain managed with his current pain medication received through a pain clinic.  Patient no longer on Xarelto due to hematuria; he continues on Aspirin; denied bleeding. He denies any chest pressure, palpitations, lightheadedness or dizziness.    Patient continues to see his Oncologist; he gets Lupron inj,  Xgeva inj as scheduled.  He takes [place] daily. Patient is compliant with medications.  He is in no acute distress/concerns at this time.  I spent50 minutes providing this consultation; time includes chart review and documentation.  Morethan 50% of the time in this consultation was spent coordinating communication.  HISTORY OF PRESENT ILLNESS:Logan Pilatois a 86 y.o.year oldmalewith multiple medical problems including arthritis, prostate cancer with mets to bone, chronic pain, a fib, CHF. Palliative Care was asked to help address goals of care  CODE STATUS: DNR  PPS: 60%  HOSPICE ELIGIBILITY/DIAGNOSIS: TBD  PAST MEDICAL HISTORY:  Past Medical History:  Diagnosis Date  . Arthritis   . Atrial fibrillation ([place])   . Bone cancer ([place])   . Cardiomyopathy ([place])   . Chronic kidney disease   . Hyperlipidemia   . Hypertension   . Prostate cancer ([place]) 1997  . Unstable gait 04/16/2018   unstable gait in home    SOCIAL HX:  Social History   Tobacco Use  . Smoking status: Former Smoker    Quit date: 03/14/1975  Years since quitting: 44.7  . Smokeless tobacco: Never Used  Substance Use Topics  . Alcohol use: No    Alcohol/week: 0.0 standard drinks    Comment: occ    ALLERGIES:   Allergies  Allergen Reactions  . Ace Inhibitors Swelling and Other (See Comments)    Angioedema  . Hydrocodone Nausea And Vomiting     PERTINENT MEDICATIONS:  Outpatient Encounter Medications as of 12/14/2019  Medication Sig  . aspirin EC 81 MG tablet Take 81 mg by mouth daily.  . calcium carbonate (OSCAL) 1500 (600 Ca) MG TABS tablet Take 600 mg of elemental calcium by mouth 2 (two) times daily with a meal.  . Cholecalciferol (VITAMIN D3) 125 MCG (5000 UT) CAPS Take 5,000 Units by mouth daily.   . enzalutamide (XTANDI) 40 MG capsule Take 2 capsules (80 mg total) by mouth daily.  . furosemide (LASIX) 40 MG tablet Take 40 mg by mouth every Monday, Wednesday, and Friday.  . gabapentin (NEURONTIN) 600 MG tablet TAKE 1/2 TO 1 (ONE-HALF TO ONE) TABLET BY MOUTH 2 TO 3 TIMES DAILY FOR  CHRONIC  PAIN  . hydrOXYzine (ATARAX/VISTARIL) 25 MG tablet Take 1 tablet 2 to 3 x /day if needed for Nerves or Anxiety (Patient taking differently: Take 25 mg by mouth See admin instructions. Take 25 mg by mouth two to three times a day as needed for "nerves or anxiety")  . labetalol (NORMODYNE) 100 MG tablet Take    1 tablet      2 x /day      for BP & Heart  . losartan (COZAAR) 100 MG tablet Take 1 tablet Daily for BP (Patient taking differently: Take 100 mg by mouth daily. )  . omeprazole (PRILOSEC) 20 MG capsule Take 1 capsule Daily for Heartburn & Indigestion (Patient taking differently: Take 20 mg by mouth daily before breakfast. )  . oxyCODONE (OXY IR/ROXICODONE) 5 MG immediate release tablet Take 1 tablet (5 mg total) by mouth every 8 (eight) hours as needed for severe pain. Must last 30 days. (Patient taking differently: Take 5 mg by mouth every 8 (eight) hours. )  . simethicone (GAS RELIEF EXTRA STRENGTH) 125 MG chewable tablet Chew 125 mg by mouth every 6 (six) hours as needed for flatulence (CHERRY (red)).   No facility-administered encounter medications on file as of 12/14/2019.    PHYSICAL  EXAM/ROS:  General: In no acute distress cooperative Cardiovascular: regular rate and rhythm; denies chest pain Pulmonary: clear ant/post fields; no adventitious sounds auscultated.  Normal respiratory efforts on room air; no shortness of breath Abdomen: soft, nontender, + bowel sounds, no report of constipation GU: no suprapubic tenderness Extremities: no edema, no joint deformities Skin: no rashes to exposed skin Neurological: Weakness but otherwise nonfocal; alert and oriented x4  Teodoro Spray, NP

## 2019-12-20 ENCOUNTER — Inpatient Hospital Stay: Payer: Medicare Other

## 2019-12-20 ENCOUNTER — Inpatient Hospital Stay: Payer: Medicare Other | Attending: Oncology

## 2019-12-20 ENCOUNTER — Other Ambulatory Visit: Payer: Self-pay

## 2019-12-20 ENCOUNTER — Inpatient Hospital Stay (HOSPITAL_BASED_OUTPATIENT_CLINIC_OR_DEPARTMENT_OTHER): Payer: Medicare Other | Admitting: Oncology

## 2019-12-20 VITALS — BP 120/64 | HR 78 | Temp 97.8°F | Resp 18 | Ht 67.0 in | Wt 177.5 lb

## 2019-12-20 DIAGNOSIS — C7951 Secondary malignant neoplasm of bone: Secondary | ICD-10-CM | POA: Diagnosis not present

## 2019-12-20 DIAGNOSIS — M810 Age-related osteoporosis without current pathological fracture: Secondary | ICD-10-CM | POA: Diagnosis not present

## 2019-12-20 DIAGNOSIS — R232 Flushing: Secondary | ICD-10-CM | POA: Insufficient documentation

## 2019-12-20 DIAGNOSIS — Z7982 Long term (current) use of aspirin: Secondary | ICD-10-CM | POA: Diagnosis not present

## 2019-12-20 DIAGNOSIS — Z79818 Long term (current) use of other agents affecting estrogen receptors and estrogen levels: Secondary | ICD-10-CM | POA: Insufficient documentation

## 2019-12-20 DIAGNOSIS — S32020S Wedge compression fracture of second lumbar vertebra, sequela: Secondary | ICD-10-CM

## 2019-12-20 DIAGNOSIS — Z79899 Other long term (current) drug therapy: Secondary | ICD-10-CM | POA: Diagnosis not present

## 2019-12-20 DIAGNOSIS — C61 Malignant neoplasm of prostate: Secondary | ICD-10-CM

## 2019-12-20 LAB — CBC WITH DIFFERENTIAL (CANCER CENTER ONLY)
Abs Immature Granulocytes: 0.02 10*3/uL (ref 0.00–0.07)
Basophils Absolute: 0 10*3/uL (ref 0.0–0.1)
Basophils Relative: 0 %
Eosinophils Absolute: 0.1 10*3/uL (ref 0.0–0.5)
Eosinophils Relative: 1 %
HCT: 34.9 % — ABNORMAL LOW (ref 39.0–52.0)
Hemoglobin: 12.2 g/dL — ABNORMAL LOW (ref 13.0–17.0)
Immature Granulocytes: 0 %
Lymphocytes Relative: 28 %
Lymphs Abs: 1.5 10*3/uL (ref 0.7–4.0)
MCH: 34.9 pg — ABNORMAL HIGH (ref 26.0–34.0)
MCHC: 35 g/dL (ref 30.0–36.0)
MCV: 99.7 fL (ref 80.0–100.0)
Monocytes Absolute: 0.5 10*3/uL (ref 0.1–1.0)
Monocytes Relative: 8 %
Neutro Abs: 3.4 10*3/uL (ref 1.7–7.7)
Neutrophils Relative %: 63 %
Platelet Count: 142 10*3/uL — ABNORMAL LOW (ref 150–400)
RBC: 3.5 MIL/uL — ABNORMAL LOW (ref 4.22–5.81)
RDW: 13.3 % (ref 11.5–15.5)
WBC Count: 5.4 10*3/uL (ref 4.0–10.5)
nRBC: 0 % (ref 0.0–0.2)

## 2019-12-20 LAB — CMP (CANCER CENTER ONLY)
ALT: 18 U/L (ref 0–44)
AST: 22 U/L (ref 15–41)
Albumin: 4 g/dL (ref 3.5–5.0)
Alkaline Phosphatase: 48 U/L (ref 38–126)
Anion gap: 6 (ref 5–15)
BUN: 24 mg/dL — ABNORMAL HIGH (ref 8–23)
CO2: 27 mmol/L (ref 22–32)
Calcium: 9.6 mg/dL (ref 8.9–10.3)
Chloride: 106 mmol/L (ref 98–111)
Creatinine: 1.67 mg/dL — ABNORMAL HIGH (ref 0.61–1.24)
GFR, Estimated: 36 mL/min — ABNORMAL LOW (ref >60)
Glucose, Bld: 113 mg/dL — ABNORMAL HIGH (ref 70–99)
Potassium: 4.2 mmol/L (ref 3.5–5.1)
Sodium: 139 mmol/L (ref 135–145)
Total Bilirubin: 0.8 mg/dL (ref 0.3–1.2)
Total Protein: 7.1 g/dL (ref 6.5–8.1)

## 2019-12-20 MED ORDER — DENOSUMAB 120 MG/1.7ML ~~LOC~~ SOLN
SUBCUTANEOUS | Status: AC
  Filled 2019-12-20: qty 1.7

## 2019-12-20 MED ORDER — DENOSUMAB 120 MG/1.7ML ~~LOC~~ SOLN
120.0000 mg | Freq: Once | SUBCUTANEOUS | Status: AC
  Administered 2019-12-20: 120 mg via SUBCUTANEOUS

## 2019-12-20 MED ORDER — LEUPROLIDE ACETATE (4 MONTH) 30 MG ~~LOC~~ KIT
30.0000 mg | PACK | Freq: Once | SUBCUTANEOUS | Status: AC
  Administered 2019-12-20: 30 mg via SUBCUTANEOUS

## 2019-12-20 MED ORDER — LEUPROLIDE ACETATE (4 MONTH) 30 MG ~~LOC~~ KIT
PACK | SUBCUTANEOUS | Status: AC
  Filled 2019-12-20: qty 30

## 2019-12-20 NOTE — Progress Notes (Signed)
Hematology and Oncology Follow Up Visit  Logan Brown [id number] 03-Oct-1931 86 y.o. 12/20/2019 11:24 AM Logan Brown, MDMcKeown, [name], MD   Principle Diagnosis: 86 year old man with prostate cancer diagnosed and year 2000.  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      Prior Therapy: He is status post  radical prostatectomy followed by external beam radiation and remains disease free after diagnosis in 2000.  He presented with a fall and found to have PSA of 245 and multiple osseous lesions indicating metastatic disease to the bone in May 2019.  He was started on Lupron and Xgeva at that time.    Current therapy:   Eligard 30 mg every 4 months.  He will receive one today and repeated in February 2022.  Xgeva 120 mg every month started in January of 2020.   Xtandi 80 mg daily started in April 2021.  Interim History: Logan Brown is here for a follow-up visit.  Since the last visit, he reports no major changes in his health.  He has tolerated Xtandi without any recent issues or concerns.  He denies any recent hospitalization or illnesses.  Denies any worsening diarrhea or excessive fatigue.  His performance status and quality of life remains unchanged.         Medications: Unchanged on review. Current Outpatient Medications  Medication Sig Dispense Refill  . aspirin EC 81 MG tablet Take 81 mg by mouth daily.    . calcium carbonate (OSCAL) 1500 (600 Ca) MG TABS tablet Take 600 mg of elemental calcium by mouth 2 (two) times daily with a meal.    . Cholecalciferol (VITAMIN D3) 125 MCG (5000 UT) CAPS Take 5,000 Units by mouth daily.     . enzalutamide (XTANDI) 40 MG capsule Take 2 capsules (80 mg total) by mouth daily. 60 capsule 1  . furosemide (LASIX) 40 MG tablet Take 40 mg by mouth every Monday, Wednesday, and Friday.    . gabapentin (NEURONTIN) 600 MG tablet TAKE 1/2 TO 1 (ONE-HALF TO ONE) TABLET BY MOUTH 2  TO 3 TIMES DAILY FOR  CHRONIC  PAIN 270 tablet 0  . hydrOXYzine (ATARAX/VISTARIL) 25 MG tablet Take 1 tablet 2 to 3 x /day if needed for Nerves or Anxiety (Patient taking differently: Take 25 mg by mouth See admin instructions. Take 25 mg by mouth two to three times a day as needed for "nerves or anxiety") 90 tablet 0  . labetalol (NORMODYNE) 100 MG tablet Take    1 tablet      2 x /day      for BP & Heart 180 tablet 0  . losartan (COZAAR) 100 MG tablet Take 1 tablet Daily for BP (Patient taking differently: Take 100 mg by mouth daily. ) 90 tablet 0  . omeprazole (PRILOSEC) 20 MG capsule Take 1 capsule Daily for Heartburn & Indigestion (Patient taking differently: Take 20 mg by mouth daily before breakfast. ) 90 capsule 1  . oxyCODONE (OXY IR/ROXICODONE) 5 MG immediate release tablet Take 1 tablet (5 mg total) by mouth every 8 (eight) hours as needed for severe pain. Must last 30 days. (Patient taking differently: Take 5 mg by mouth every 8 (eight) hours. ) 90 tablet 0  . simethicone (GAS RELIEF EXTRA STRENGTH) 125 MG chewable tablet Chew 125 mg by mouth every 6 (six) hours as needed for flatulence (CHERRY (red)).     No current facility-administered medications for this visit.  Allergies:  Allergies  Allergen Reactions  . Ace Inhibitors Swelling and Other (See Comments)    Angioedema  . Hydrocodone Nausea And Vomiting       Physical Exam:  Blood pressure 120/64, pulse 78, temperature 97.8 F (36.6 C), temperature source Tympanic, resp. rate 18, height 5\' 7"  (1.702 m), weight 177 lb 8 oz (80.5 kg), SpO2 97 %.     ECOG: 2    General appearance: Alert, awake without any distress. Head: Atraumatic without abnormalities Oropharynx: Without any thrush or ulcers. Eyes: No scleral icterus. Lymph nodes: No lymphadenopathy noted in the cervical, supraclavicular, or axillary nodes Heart:regular rate and rhythm, without any murmurs or gallops.   Lung: Clear to auscultation without  any rhonchi, wheezes or dullness to percussion. Abdomin: Soft, nontender without any shifting dullness or ascites. Musculoskeletal: No clubbing or cyanosis. Neurological: No motor or sensory deficits. Skin: No rashes or lesions.          Lab Results: Lab Results  Component Value Date   WBC 5.0 08/03/2019   HGB 12.0 (L) 08/03/2019   HCT 34.9 (L) 08/03/2019   MCV 98.6 08/03/2019   PLT 143 (L) 08/03/2019     Chemistry      Component Value Date/Time   NA 140 08/03/2019 0940   NA 143 03/23/2018 1109   K 4.4 08/03/2019 0940   CL 111 08/03/2019 0940   CO2 21 (L) 08/03/2019 0940   BUN 27 (H) 08/03/2019 0940   BUN 24 03/23/2018 1109   CREATININE 1.65 (H) 08/03/2019 0940   CREATININE 1.58 (H) 04/27/2019 1514      Component Value Date/Time   CALCIUM 9.0 08/03/2019 0940   ALKPHOS 63 08/03/2019 0940   AST 17 08/03/2019 0940   ALT 15 08/03/2019 0940   BILITOT 0.5 08/03/2019 0940       Results for Logan Brown, Logan Brown (MRN [id number]) as of 12/20/2019 11:26  Ref. Range 06/02/2019 11:38 08/03/2019 09:40  Prostate Specific Ag, Serum Latest Ref Range: 0.0 - 4.0 ng/mL 28.3 (H) 0.5      Impression and Plan:   86 year old with the following:   1.    Castration-resistant prostate cancer with disease to the bone diagnosed in 2019.   He is on [place] which she has tolerated it very well with excellent PSA response but has declined in May 2021 to 0.5 after his PSA was 28.3 in March 2021.  Risks and benefits of continuing this treatment were discussed.  Potential complication associated with the this treatment including hypertension, fatigue and rarely seizures.  He is agreeable to continue at this time.  2.  Androgen deprivation: He is currently on Eligard which will be repeated every 4 months.  He will receive it today with complications reiterated.  These complications include weight gain, hot flashes and osteoporosis.  He is agreeable to receive it.  3.  Bone directed  therapy: He has received Xgeva every 4 months.  Complication associated with this treatment including osteonecrosis of the jaw and hypocalcemia were reiterated.   4.  Prognosis and goals of care: Therapy remains patent without dysphagia warranted is reasonable performance status.  5.  Back pain: Manageable currently.  Related to both arthritis and prostate cancer metastasis.  6.  Follow-up:  In 4 months for repeat visit.  30  minutes were dedicated to this encounter.  The time was spent on reviewing his disease status, discussing treatment options and addressing complications related to his cancer and chemotherapy.  Zola Button, MD 10/12/202111:24 AM

## 2019-12-21 ENCOUNTER — Other Ambulatory Visit: Payer: Self-pay | Admitting: Internal Medicine

## 2019-12-21 DIAGNOSIS — K219 Gastro-esophageal reflux disease without esophagitis: Secondary | ICD-10-CM

## 2019-12-21 LAB — PROSTATE-SPECIFIC AG, SERUM (LABCORP): Prostate Specific Ag, Serum: <0.1 ng/mL (ref 0.0–4.0)

## 2019-12-29 ENCOUNTER — Other Ambulatory Visit: Payer: Self-pay

## 2019-12-29 ENCOUNTER — Ambulatory Visit (HOSPITAL_COMMUNITY): Payer: Medicare Other | Attending: Adult Health

## 2019-12-29 DIAGNOSIS — R0602 Shortness of breath: Secondary | ICD-10-CM | POA: Diagnosis present

## 2019-12-29 DIAGNOSIS — I5032 Chronic diastolic (congestive) heart failure: Secondary | ICD-10-CM

## 2019-12-29 LAB — ECHOCARDIOGRAM COMPLETE
Area-P 1/2: 2.95 cm2
S' Lateral: 2.8 cm

## 2020-01-03 ENCOUNTER — Telehealth: Payer: Self-pay | Admitting: Adult Health

## 2020-01-03 NOTE — Telephone Encounter (Signed)
Left message for patient to call back  

## 2020-01-03 NOTE — Telephone Encounter (Signed)
Patient calling to discuss echo results.

## 2020-01-05 NOTE — Telephone Encounter (Signed)
Left detailed message for Pt.  Advised his Echo looked very good.  Pumping function normal.  Valves looked good.  Advised nothing on echo that would explain Pt's sob.  Advised to continue current medication regimen.  Advised to call back if he has any further questions.

## 2020-01-05 NOTE — Telephone Encounter (Signed)
Pt called back.  See previous note.  Pt indicates understanding.

## 2020-01-09 NOTE — Progress Notes (Signed)
Cardiology Office Note   Date:  01/10/2020   ID:  Logan Brown., DOB 13-[name]-1932, MRN [id number]  PCP:  [name], MD  Cardiologist:   Minus Breeding, MD   Chief Complaint  Patient presents with  . Dizziness     History of Present Illness: Logan Brown. is a 86 y.o. male who is referred by [name], MD for follow up of cardiomyopathy.  He had a previous EF of 25%.  However, in 2017 it was checked and it was 50 - 55%.  I suspect he was in atrial fibrillation as he is on amiodarone now.  He did not have a catheterization.  He was on Xarelto until he had hematuria and this was stopped.  When I saw him in [name] 2020 he was on amiodarone and I continued this.  However, was off of this when I saw him later in the year and I was unclear what happened to this.   He had orthostasis when he was seen by Rosaria Ferries [facility] in October.  He was hospitalized with dizziness in Nov.   He was orthostatic and had acute on chronic renal insufficiency.  He was hydrated.   There was a mention of paroxysmal atrial fib.  However, I looked at the EKGs, progress notes and rhythm strips and could not find PAF.    He was sent home off of Lasix and losartan.  He was back in the ED in mid Dec.   He had dizziness and had some increased creat but there were no other acute abnormalities.  He declined an MRI and was discharged from the ED.   He did have an event monitor with no significant arrhythmias.  At a telehealth visit last week he had worsening SOB and fatigue.  An echo was ordered which demonstrated well preserved EF.     He returns today.  There is some confusion over his meds that he is taking at home.  We reviewed the list carefully.  His companion says she does not want him to take his medicines and is not sure whether he is taking them at the schedule times.  He is overall had no increase in his dyspnea which is probably baseline.  He is not describing PND or orthopnea.  He ambulates a  little bit in the store but for the most part he is limited by his back pain.  He also occasionally has dizziness.  He reports these happen when he has hot flashes which he says is related to his Xtandi.  He also might have like had spells of these straining to have a bowel movement.  He has had to be helped to a floor at times with this.  He does not notice any palpitations.  He does not have any chest pressure, neck discomfort or arm discomfort.  He has no weight gain or edema.   Past Medical History:  Diagnosis Date  . Arthritis   . Atrial fibrillation ([place])   . Bone cancer ([place])   . Cardiomyopathy ([place])   . Chronic kidney disease   . Hyperlipidemia   . Hypertension   . Prostate cancer ([place]) 1997  . Unstable gait 04/16/2018   unstable gait in home    Past Surgical History:  Procedure Laterality Date  . CATARACT EXTRACTION    . EYE SURGERY Bilateral    IOL/CE on Lt in 1998 and Rt in 2011.  [name] Kitchen PROSTATE SURGERY    . WRIST  FOREIGN BODY REMOVAL     1957 glass     Current Outpatient Medications  Medication Sig Dispense Refill  . ALPRAZolam (XANAX) 1 MG tablet Take 1 mg by mouth at bedtime as needed for anxiety. Patient takes 1/2 tablet in the morning and 1 tablet at night.    [name] Kitchen aspirin EC 81 MG tablet Take 81 mg by mouth daily.    . Calcium Carbonate Antacid (CALCIUM CARBONATE PO) Take 1,800 mg of elemental calcium by mouth 2 (two) times daily with a meal.     . Cholecalciferol (VITAMIN D3) 125 MCG (5000 UT) CAPS Take 5,000 Units by mouth daily.     . Cyanocobalamin (VITAMIN B-12 PO) Take 1,000 mg by mouth in the morning and at bedtime.    . enzalutamide (XTANDI) 40 MG capsule Take 2 capsules (80 mg total) by mouth daily. 60 capsule 1  . furosemide (LASIX) 40 MG tablet Take 40 mg by mouth every Monday, Wednesday, and Friday.    . gabapentin (NEURONTIN) 600 MG tablet TAKE 1/2 TO 1 (ONE-HALF TO ONE) TABLET BY MOUTH 2 TO 3 TIMES DAILY FOR  CHRONIC  PAIN 270 tablet 0  . hydrOXYzine  (ATARAX/VISTARIL) 25 MG tablet Take 1 tablet 2 to 3 x /day if needed for Nerves or Anxiety (Patient taking differently: Take 25 mg by mouth See admin instructions. Take 25 mg by mouth two to three times a day as needed for "nerves or anxiety") 90 tablet 0  . labetalol (NORMODYNE) 100 MG tablet Take 0.5 tablets (50 mg total) by mouth 2 (two) times daily. 90 tablet 3  . losartan (COZAAR) 100 MG tablet Take 1 tablet Daily for BP (Patient taking differently: Take 100 mg by mouth daily. ) 90 tablet 0  . omeprazole (PRILOSEC) 20 MG capsule Take     1 capsule     Daily     to Prevent Heart burn & Indigestion 90 capsule 0  . oxyCODONE (OXY IR/ROXICODONE) 5 MG immediate release tablet Take 1 tablet (5 mg total) by mouth every 8 (eight) hours as needed for severe pain. Must last 30 days. (Patient taking differently: Take 5 mg by mouth every 8 (eight) hours. ) 90 tablet 0  . simethicone (GAS RELIEF EXTRA STRENGTH) 125 MG chewable tablet Chew 125 mg by mouth every 6 (six) hours as needed for flatulence (CHERRY (red)).     No current facility-administered medications for this visit.    Allergies:   Ace inhibitors and Hydrocodone    ROS:  Please see the history of present illness.   Otherwise, review of systems are positive for none.   All other systems are reviewed and negative.    PHYSICAL EXAM: VS:  BP (!) 162/80   Pulse (!) 46   Ht 5\' 7"  (1.702 m)   Wt 183 lb (83 kg)   SpO2 98%   BMI 28.66 kg/m  , BMI Body mass index is 28.66 kg/m. GENERAL:  Well appearing NECK:  No jugular venous distention, waveform within normal limits, carotid upstroke brisk and symmetric, no bruits, no thyromegaly LUNGS:  Clear to auscultation bilaterally CHEST:  Unremarkable HEART:  PMI not displaced or sustained,S1 and S2 within normal limits, no S3, no S4, no clicks, no rubs, no murmurs ABD:  Flat, positive bowel sounds normal in frequency in pitch, no bruits, no rebound, no guarding, no midline pulsatile mass, no  hepatomegaly, no splenomegaly EXT:  2 plus pulses throughout, no edema, no cyanosis no clubbing   EKG:  EKG is  ordered today. Sinus rhythm, rate 46, axis within normal limits, intervals within normal limits, no acute ST-T wave changes. PACs  Recent Labs: 04/27/2019: Magnesium 2.2; TSH 3.06 12/20/2019: ALT 18; BUN 24; Creatinine 1.67; Hemoglobin 12.2; Platelet Count 142; Potassium 4.2; Sodium 139     Lab Results  Component Value Date   TSH 3.06 04/27/2019   ALT 18 12/20/2019   AST 22 12/20/2019   ALKPHOS 48 12/20/2019   BILITOT 0.8 12/20/2019   PROT 7.1 12/20/2019   ALBUMIN 4.0 12/20/2019     Wt Readings from Last 3 Encounters:  01/10/20 183 lb (83 kg)  12/20/19 177 lb 8 oz (80.5 kg)  12/09/19 183 lb (83 kg)      Other studies Reviewed: Additional studies/ records that were reviewed today include:   Event monitor Review of the above records demonstrates:  See elsewhere    ASSESSMENT AND PLAN:  CARDIOMYOPATHY:    His EF was well preserved on echo last week.  I will be making the med changes as below.  HTN: Blood pressure is elevated.  However, it has not been otherwise They are going to keep a blood pressure diary for 2 weeks and present those data.  He might need the addition of amlodipine.   CAROTID STENOSIS:  He had mild plaque in July 2018.  No further testing.  ATRIAL FIB:    Earlier this year he wore a monitor and I saw no A. fib.  He has had no symptomatic recurrence of atrial fibrillation.  He has been unable to take anticoagulation in the past.  At this point with his bradycardia I am going to reduce his labetalol.  We will as above need to watch his blood pressures.   CKD III:  The last creat was 1.67 which is baseline.  No change in therapy  DYSPNEA: This is baseline and not significant to him.  No change in therapy.   DIZZINESS:   As above I am going to reduce his labetalol with his bradycardia.  I am going to continue a low-dose beta-blocker because of  his previously reduced ejection fraction and apparent atrial fibrillation in the distant past when he lived elsewhere.  However, if he remains bradycardic I would discontinue this medicine altogether.   Current medicines are reviewed at length with the patient today.  The patient does not have concerns regarding medicines.  The following changes have been made:  As above  Labs/ tests ordered today include:  None  Orders Placed This Encounter  Procedures  . EKG 12-Lead     Disposition:   FU with APP in 3 months.    Signed, Minus Breeding, MD  01/10/2020 2:33 PM    [facility]

## 2020-01-10 ENCOUNTER — Other Ambulatory Visit: Payer: Self-pay

## 2020-01-10 ENCOUNTER — Encounter: Payer: Self-pay | Admitting: Cardiology

## 2020-01-10 ENCOUNTER — Ambulatory Visit (INDEPENDENT_AMBULATORY_CARE_PROVIDER_SITE_OTHER): Payer: Medicare Other | Admitting: Cardiology

## 2020-01-10 VITALS — BP 162/80 | HR 46 | Ht 67.0 in | Wt 183.0 lb

## 2020-01-10 DIAGNOSIS — I10 Essential (primary) hypertension: Secondary | ICD-10-CM

## 2020-01-10 DIAGNOSIS — N1831 Chronic kidney disease, stage 3a: Secondary | ICD-10-CM | POA: Diagnosis not present

## 2020-01-10 DIAGNOSIS — I480 Paroxysmal atrial fibrillation: Secondary | ICD-10-CM

## 2020-01-10 DIAGNOSIS — I6523 Occlusion and stenosis of bilateral carotid arteries: Secondary | ICD-10-CM

## 2020-01-10 DIAGNOSIS — R0602 Shortness of breath: Secondary | ICD-10-CM

## 2020-01-10 DIAGNOSIS — R42 Dizziness and giddiness: Secondary | ICD-10-CM

## 2020-01-10 MED ORDER — LABETALOL HCL 100 MG PO TABS: 50.0000 mg | ORAL_TABLET | Freq: Two times a day (BID) | ORAL | 3 refills | Status: DC

## 2020-01-10 NOTE — Patient Instructions (Signed)
Medication Instructions:  DECREASE your labetalol to 50 mg twice a day *If you need a refill on your cardiac medications before your next appointment, please call your pharmacy*   Lab Work: None ordered If you have labs (blood work) drawn today and your tests are completely normal, you will receive your results only by: [name] Kitchen MyChart Message (if you have MyChart) OR . A paper copy in the mail If you have any lab test that is abnormal or we need to change your treatment, we will call you to review the results.   Testing/Procedures: None ordered   Follow-Up: At [facility], you and your health needs are our priority.  As part of our continuing mission to provide you with exceptional heart care, we have created designated Provider Care Teams.  These Care Teams include your primary Cardiologist (physician) and Advanced Practice Providers (APPs -  Physician Assistants and Nurse Practitioners) who all work together to provide you with the care you need, when you need it.  We recommend signing up for the patient portal called "MyChart".  Sign up information is provided on this After Visit Summary.  MyChart is used to connect with patients for Virtual Visits (Telemedicine).  Patients are able to view lab/test results, encounter notes, upcoming appointments, etc.  Non-urgent messages can be sent to your provider as well.   To learn more about what you can do with MyChart, go to [email or web address].    Your next appointment:   2 month(s)  The format for your next appointment:   In Person  Provider:   You will see one of the following Advanced Practice Providers on your designated Care Team:    Rosaria Ferries, PA-C      Other Instructions Keep a log of your blood pressure readings and share them with our office.

## 2020-01-16 ENCOUNTER — Encounter: Payer: Self-pay | Admitting: Pain Medicine

## 2020-01-16 ENCOUNTER — Other Ambulatory Visit: Payer: Self-pay

## 2020-01-16 ENCOUNTER — Ambulatory Visit: Payer: Medicare Other | Attending: Pain Medicine | Admitting: Pain Medicine

## 2020-01-16 VITALS — BP 141/89 | HR 59 | Temp 97.9°F | Resp 18 | Ht 67.0 in | Wt 180.0 lb

## 2020-01-16 DIAGNOSIS — C7951 Secondary malignant neoplasm of bone: Secondary | ICD-10-CM | POA: Diagnosis not present

## 2020-01-16 DIAGNOSIS — G893 Neoplasm related pain (acute) (chronic): Secondary | ICD-10-CM | POA: Insufficient documentation

## 2020-01-16 DIAGNOSIS — M549 Dorsalgia, unspecified: Secondary | ICD-10-CM | POA: Insufficient documentation

## 2020-01-16 DIAGNOSIS — G894 Chronic pain syndrome: Secondary | ICD-10-CM | POA: Diagnosis not present

## 2020-01-16 DIAGNOSIS — C61 Malignant neoplasm of prostate: Secondary | ICD-10-CM | POA: Diagnosis present

## 2020-01-16 DIAGNOSIS — Z9181 History of falling: Secondary | ICD-10-CM | POA: Insufficient documentation

## 2020-01-16 MED ORDER — OXYCODONE HCL 5 MG PO TABS: 5.0000 mg | ORAL_TABLET | Freq: Two times a day (BID) | ORAL | 0 refills | Status: DC

## 2020-01-16 NOTE — Patient Instructions (Signed)
____________________________________________________________________________________________  Medication Rules  Purpose: To inform patients, and their family members, of our rules and regulations.  Applies to: All patients receiving prescriptions (written or electronic).  Pharmacy of record: Pharmacy where electronic prescriptions will be sent. If written prescriptions are taken to a different pharmacy, please inform the nursing staff. The pharmacy listed in the electronic medical record should be the one where you would like electronic prescriptions to be sent.  Electronic prescriptions: In compliance with the [place] Strengthen Opioid Misuse Prevention (STOP) Act of 2017 (Session Law 2017-74/H243), effective March 10, 2018, all controlled substances must be electronically prescribed. Calling prescriptions to the pharmacy will cease to exist.  Prescription refills: Only during scheduled appointments. Applies to all prescriptions.  NOTE: The following applies primarily to controlled substances (Opioid* Pain Medications).   Type of encounter (visit): For patients receiving controlled substances, face-to-face visits are required. (Not an option or up to the patient.)  Patient's responsibilities: 1. Pain Pills: Bring all pain pills to every appointment (except for procedure appointments). 2. Pill Bottles: Bring pills in original pharmacy bottle. Always bring the newest bottle. Bring bottle, even if empty. 3. Medication refills: You are responsible for knowing and keeping track of what medications you take and those you need refilled. The day before your appointment: write a list of all prescriptions that need to be refilled. The day of the appointment: give the list to the admitting nurse. Prescriptions will be written only during appointments. No prescriptions will be written on procedure days. If you forget a medication: it will not be "Called in", "Faxed", or "electronically sent".  You will need to get another appointment to get these prescribed. No early refills. Do not call asking to have your prescription filled early. 4. Prescription Accuracy: You are responsible for carefully inspecting your prescriptions before leaving our office. Have the discharge nurse carefully go over each prescription with you, before taking them home. Make sure that your name is accurately spelled, that your address is correct. Check the name and dose of your medication to make sure it is accurate. Check the number of pills, and the written instructions to make sure they are clear and accurate. Make sure that you are given enough medication to last until your next medication refill appointment. 5. Taking Medication: Take medication as prescribed. When it comes to controlled substances, taking less pills or less frequently than prescribed is permitted and encouraged. Never take more pills than instructed. Never take medication more frequently than prescribed.  6. Inform other Doctors: Always inform, all of your healthcare providers, of all the medications you take. 7. Pain Medication from other Providers: You are not allowed to accept any additional pain medication from any other Doctor or Healthcare provider. There are two exceptions to this rule. (see below) In the event that you require additional pain medication, you are responsible for notifying us, as stated below. 8. Medication Agreement: You are responsible for carefully reading and following our Medication Agreement. This must be signed before receiving any prescriptions from our practice. Safely store a copy of your signed Agreement. Violations to the Agreement will result in no further prescriptions. (Additional copies of our Medication Agreement are available upon request.) 9. Laws, Rules, & Regulations: All patients are expected to follow all Federal and State Laws, Statutes, Rules, & Regulations. Ignorance of the Laws does not constitute a  valid excuse.  10. Illegal drugs and Controlled Substances: The use of illegal substances (including, but not limited to marijuana and its   derivatives) and/or the illegal use of any controlled substances is strictly prohibited. Violation of this rule may result in the immediate and permanent discontinuation of any and all prescriptions being written by our practice. The use of any illegal substances is prohibited. 11. Adopted CDC guidelines & recommendations: Target dosing levels will be at or below 60 MME/day. Use of benzodiazepines** is not recommended.  Exceptions: There are only two exceptions to the rule of not receiving pain medications from other Healthcare Providers. 1. Exception #1 (Emergencies): In the event of an emergency (i.e.: accident requiring emergency care), you are allowed to receive additional pain medication. However, you are responsible for: As soon as you are able, call our office [phone], at any time of the day or night, and leave a message stating your name, the date and nature of the emergency, and the name and dose of the medication prescribed. In the event that your call is answered by a member of our staff, make sure to document and save the date, time, and the name of the person that took your information.  2. Exception #2 (Planned Surgery): In the event that you are scheduled by another doctor or dentist to have any type of surgery or procedure, you are allowed (for a period no longer than 30 days), to receive additional pain medication, for the acute post-op pain. However, in this case, you are responsible for picking up a copy of our "Post-op Pain Management for Surgeons" handout, and giving it to your surgeon or dentist. This document is available at our office, and does not require an appointment to obtain it. Simply go to our office during business hours (Monday-Thursday from 8:00 AM to 4:00 PM) (Friday 8:00 AM to 12:00 Noon) or if you have a scheduled appointment  with us, prior to your surgery, and ask for it by name. In addition, you are responsible for: calling our office [phone], at any time of the day or night, and leaving a message stating your name, name of your surgeon, type of surgery, and date of procedure or surgery. Failure to comply with your responsibilities may result in termination of therapy involving the controlled substances.  *Opioid medications include: morphine, codeine, oxycodone, oxymorphone, hydrocodone, hydromorphone, meperidine, tramadol, tapentadol, buprenorphine, fentanyl, methadone. **Benzodiazepine medications include: diazepam (Valium), alprazolam (Xanax), clonazepam (Klonopine), lorazepam (Ativan), clorazepate (Tranxene), chlordiazepoxide (Librium), estazolam (Prosom), oxazepam (Serax), temazepam (Restoril), triazolam (Halcion) (Last updated: 11/15/2019) ____________________________________________________________________________________________   ____________________________________________________________________________________________  Medication Recommendations and Reminders  Applies to: All patients receiving prescriptions (written and/or electronic).  Medication Rules & Regulations: These rules and regulations exist for your safety and that of others. They are not flexible and neither are we. Dismissing or ignoring them will be considered "non-compliance" with medication therapy, resulting in complete and irreversible termination of such therapy. (See document titled "Medication Rules" for more details.) In all conscience, because of safety reasons, we cannot continue providing a therapy where the patient does not follow instructions.  Pharmacy of record:   Definition: This is the pharmacy where your electronic prescriptions will be sent.   We do not endorse any particular pharmacy, however, we have experienced problems with [facility] not securing enough medication supply for the community.  We do not  restrict you in your choice of pharmacy. However, once we write for your prescriptions, we will NOT be re-sending more prescriptions to fix restricted supply problems created by your pharmacy, or your insurance.   The pharmacy listed in the electronic medical record should be the   one where you want electronic prescriptions to be sent.  If you choose to change pharmacy, simply notify our nursing staff.  Recommendations:  Keep all of your pain medications in a safe place, under lock and key, even if you live alone. We will NOT replace lost, stolen, or damaged medication.  After you fill your prescription, take 1 week's worth of pills and put them away in a safe place. You should keep a separate, properly labeled bottle for this purpose. The remainder should be kept in the original bottle. Use this as your primary supply, until it runs out. Once it's gone, then you know that you have 1 week's worth of medicine, and it is time to come in for a prescription refill. If you do this correctly, it is unlikely that you will ever run out of medicine.  To make sure that the above recommendation works, it is very important that you make sure your medication refill appointments are scheduled at least 1 week before you run out of medicine. To do this in an effective manner, make sure that you do not leave the office without scheduling your next medication management appointment. Always ask the nursing staff to show you in your prescription , when your medication will be running out. Then arrange for the receptionist to get you a return appointment, at least 7 days before you run out of medicine. Do not wait until you have 1 or 2 pills left, to come in. This is very poor planning and does not take into consideration that we may need to cancel appointments due to bad weather, sickness, or emergencies affecting our staff.  DO NOT ACCEPT A "Partial Fill": If for any reason your pharmacy does not have enough pills/tablets  to completely fill or refill your prescription, do not allow for a "partial fill". The law allows the pharmacy to complete that prescription within 72 hours, without requiring a new prescription. If they do not fill the rest of your prescription within those 72 hours, you will need a separate prescription to fill the remaining amount, which we will NOT provide. If the reason for the partial fill is your insurance, you will need to talk to the pharmacist about payment alternatives for the remaining tablets, but again, DO NOT ACCEPT A PARTIAL FILL, unless you can trust your pharmacist to obtain the remainder of the pills within 72 hours.  Prescription refills and/or changes in medication(s):   Prescription refills, and/or changes in dose or medication, will be conducted only during scheduled medication management appointments. (Applies to both, written and electronic prescriptions.)  No refills on procedure days. No medication will be changed or started on procedure days. No changes, adjustments, and/or refills will be conducted on a procedure day. Doing so will interfere with the diagnostic portion of the procedure.  No phone refills. No medications will be "called into the pharmacy".  No Fax refills.  No weekend refills.  No Holliday refills.  No after hours refills.  Remember:  Business hours are:  Monday to Thursday 8:00 AM to 4:00 PM Provider's Schedule: Peni Rupard, MD - Appointments are:  Medication management: Monday and Wednesday 8:00 AM to 4:00 PM Procedure day: Tuesday and Thursday 7:30 AM to 4:00 PM Bilal Lateef, MD - Appointments are:  Medication management: Tuesday and Thursday 8:00 AM to 4:00 PM Procedure day: Monday and Wednesday 7:30 AM to 4:00 PM (Last update: 09/28/2019) ____________________________________________________________________________________________    

## 2020-01-16 NOTE — Progress Notes (Signed)
PROVIDER NOTE: Information contained herein reflects review and annotations entered in association with encounter. Interpretation of such information and data should be left to medically-trained personnel. Information provided to patient can be located elsewhere in the medical record under "Patient Instructions". Document created using STT-dictation technology, any transcriptional errors that may result from process are unintentional.    Patient: Logan Brown.  Service Category: E/M  Provider: Gaspar Cola, MD  DOB: [birth year]  DOS: 01/16/2020  Specialty: Interventional Pain Management  MRN: [id number]  Setting: Ambulatory outpatient  PCP: [name], MD  Type: Established Patient    Referring Provider: [name], MD  Location: Office  Delivery: Face-to-face     HPI  Mr. Logan Egelston., a 86 y.o. year old male, is here today because of his Chronic pain syndrome [G89.4]. [name] primary complain today is Back Pain Last encounter: My last encounter with him was on Visit date not found. Pertinent problems: 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 ([place]); History of prostate cancer; Osteopenia of spine; Osteopenia determined by x-ray; Osteoarthritis of facet joint of lumbar spine; Osteoarthritis involving multiple joints; DDD (degenerative disc disease), lumbar; Osteoarthritis of hip (Bilateral); Abnormal MRI, lumbar spine; Lumbar central spinal stenosis, w/o neurogenic claudication; Lumbar foraminal stenosis (Bilateral: L4-5) (Right: L1-2, L5-S1) (Left: L2-3, L3-4); Lumbar facet hypertrophy; Lumbar facet joint syndrome (Bilateral); Metastatic cancer to spine [facility]); History of kyphoplasty (L2); Spine metastasis ([place]); and Multilevel spine pain on  their pertinent problem list. Pain Assessment: Severity of Chronic pain is reported as a 5 /10. Location: Back Lower/none. Onset: More than a month ago. Quality: Aching, Pressure. Timing: Constant. Modifying factor(s): Laying down. Vitals:  height is 5' 7" (1.702 m) and weight is 180 lb (81.6 kg). His temporal temperature is 97.9 F (36.6 C). His blood pressure is 141/89 (abnormal) and his pulse is 59 (abnormal). His respiration is 18 and oxygen saturation is 98%.   Reason for encounter: medication management.  The patient indicates doing well with the current medication regimen. No adverse reactions or side effects reported to the medications.   Pharmacotherapy Assessment   Analgesic: Oxycodone IR  5 mg, 1 tab PO BID (15 MME/day) MME/day: 15 mg/day.   Monitoring: [place] PMP: PDMP reviewed during this encounter.       Pharmacotherapy: No side-effects or adverse reactions reported. Compliance: No problems identified. Effectiveness: Clinically acceptable.  Hart Rochester, RN  01/16/2020  1:15 PM  Sign when Signing Visit Nursing Pain Medication Assessment:  Safety precautions to be maintained throughout the outpatient stay will include: orient to surroundings, keep bed in low position, maintain call bell within reach at all times, provide assistance with transfer out of bed and ambulation.  Medication Inspection Compliance: Pill count conducted under aseptic conditions, in front of the patient. Neither the pills nor the bottle was removed from the patient's sight at any time. Once count was completed pills were immediately returned to the patient in their original bottle.  Medication: Oxycodone IR Pill/Patch Count: 0 of 90 pills remain Pill/Patch Appearance: Markings consistent with prescribed medication Bottle Appearance: Standard pharmacy container. Clearly labeled. Filled Date: 09 / 01 / 2021 Last Medication intake:  Today    UDS:  Summary  Date Value Ref Range Status  03/23/2018  FINAL  Final  Comment:    ==================================================================== TOXASSURE COMP DRUG ANALYSIS,UR ==================================================================== Test                             Result       Flag       Units Drug Present and Declared for Prescription Verification   Fentanyl                       9            EXPECTED   ng/mg creat   Norfentanyl                    40           EXPECTED   ng/mg creat    Source of fentanyl is a scheduled prescription medication,    including IV, patch, and transmucosal formulations. Norfentanyl    is an expected metabolite of fentanyl.   Gabapentin                     PRESENT      EXPECTED Drug Present not Declared for Prescription Verification   Acetaminophen                  PRESENT      UNEXPECTED Drug Absent but Declared for Prescription Verification   Salicylate                     Not Detected UNEXPECTED    Aspirin, as indicated in the declared medication list, is not    always detected even when used as directed.   Hydroxyzine                    Not Detected UNEXPECTED ==================================================================== Test                      Result    Flag   Units      Ref Range   Creatinine              235              mg/dL      >=20 ==================================================================== Declared Medications:  The flagging and interpretation on this report are based on the  following declared medications.  Unexpected results may arise from  inaccuracies in the declared medications.  **Note: The testing scope of this panel includes these medications:  Fentanyl (Fentanyl Patch)  Gabapentin  Hydroxyzine  **Note: The testing scope of this panel does not include small to  moderate amounts of these reported medications:  Aspirin (Aspirin 81)  **Note: The testing scope of this panel does not include following  reported medications:  Labetalol  Lisinopril   Multivitamin (MVI)  Omeprazole  Vitamin D3 ==================================================================== For clinical consultation, please call [phone]. ====================================================================      ROS  Constitutional: Denies any fever or chills Gastrointestinal: No reported hemesis, hematochezia, vomiting, or acute GI distress Musculoskeletal: Denies any acute onset joint swelling, redness, loss of ROM, or weakness Neurological: No reported episodes of acute onset apraxia, aphasia, dysarthria, agnosia, amnesia, paralysis, loss of coordination, or loss of consciousness  Medication Review  Calcium Carbonate Antacid, Cyanocobalamin, Vitamin D3, aspirin EC, enzalutamide, furosemide, gabapentin, labetalol, losartan, omeprazole, and oxyCODONE  History Review  Allergy: [name] is allergic to ace inhibitors and hydrocodone. Drug: [name]  reports no history of  drug use. Alcohol:  reports no history of alcohol use. Tobacco:  reports that he quit smoking about 44 years ago. He has never used smokeless tobacco. Social: [name]  reports that he quit smoking about 44 years ago. He has never used smokeless tobacco. He reports that he does not drink alcohol and does not use drugs. Medical:  has a past medical history of Arthritis, Atrial fibrillation ([place]), Bone cancer ([place]), Cardiomyopathy ([place]), Chronic kidney disease, Hyperlipidemia, Hypertension, Prostate cancer ([place]) (1997), and Unstable gait (04/16/2018). Surgical: Mr. Wendt  has a past surgical history that includes Eye surgery (Bilateral); Cataract extraction; Prostate surgery; and Wrist foreign body removal. Family: family history is not on file.  Laboratory Chemistry Profile   Renal Lab Results  Component Value Date   BUN 24 (H) 12/20/2019   CREATININE 1.67 (H) 12/20/2019   BCR 15 04/27/2019   GFRAA 43 (L) 08/03/2019   GFRNONAA 36 (L) 12/20/2019     Hepatic Lab Results   Component Value Date   AST 22 12/20/2019   ALT 18 12/20/2019   ALBUMIN 4.0 12/20/2019   ALKPHOS 48 12/20/2019     Electrolytes Lab Results  Component Value Date   NA 139 12/20/2019   K 4.2 12/20/2019   CL 106 12/20/2019   CALCIUM 9.6 12/20/2019   MG 2.2 04/27/2019   PHOS 4.6 01/10/2019     Bone Lab Results  Component Value Date   VD25OH 25 (L) 04/27/2019   25OHVITD1 41 03/23/2018   25OHVITD2 <1.0 03/23/2018   25OHVITD3 41 03/23/2018   TESTOSTERONE <3 (L) 11/17/2018     Inflammation (CRP: Acute Phase) (ESR: Chronic Phase) Lab Results  Component Value Date   CRP 6 03/23/2018   ESRSEDRATE 30 03/23/2018   LATICACIDVEN 1.7 05/23/2019       Note: Above Lab results reviewed.  Recent Imaging Review  ECHOCARDIOGRAM COMPLETE    ECHOCARDIOGRAM REPORT       Patient Name:   Donyea Beverlin. Date of Exam: 12/29/2019 Medical Rec #:  [id number]              Height:       67.0 in Accession #:    [id number]             Weight:       177.5 lb Date of Birth:  1932/01/20               BSA:          1.922 m Patient Age:    24 years               BP:           120/64 mmHg Patient Gender: M                      HR:           60 bpm. Exam Location:  [place]  Procedure: 2D Echo, Cardiac Doppler and Color Doppler  Indications:    I50.9* Heart failure (unspecified); R06.00 Dyspnea   History:        Patient has prior history of Echocardiogram examinations, most                 recent 12/07/2015. Arrythmias:Atrial Fibrillation,                 Signs/Symptoms:Shortness of Breath; Risk Factors:Hypertension  and Dyslipidemia. Bradycardia. Orthostatic hypotension.                 Prediabetes. NICM. Chronic pain syndrome.   Sonographer:    Diamond Nickel RCS Referring Phys: [place]   1. Left ventricular ejection fraction, by estimation, is 55 to 60%. The left ventricle has normal function. The left ventricle demonstrates regional  wall motion abnormalities with basal inferior hypokinesis. There is mild left ventricular hypertrophy.  Left ventricular diastolic parameters are consistent with Grade I diastolic dysfunction (impaired relaxation).  2. Right ventricular systolic function is normal. The right ventricular size is normal. Tricuspid regurgitation signal is inadequate for assessing PA pressure.  3. The mitral valve is normal in structure. Trivial mitral valve regurgitation.  4. The aortic valve is tricuspid. Aortic valve regurgitation is trivial. No aortic stenosis is present.  5. Aortic dilatation noted. There is mild dilatation of the aortic root, measuring 39 mm.  6. The inferior vena cava is normal in size with greater than 50% respiratory variability, suggesting right atrial pressure of 3 mmHg.  FINDINGS  Left Ventricle: Left ventricular ejection fraction, by estimation, is 55 to 60%. The left ventricle has normal function. The left ventricle demonstrates regional wall motion abnormalities. The left ventricular internal cavity size was normal in size.  There is mild left ventricular hypertrophy. Left ventricular diastolic parameters are consistent with Grade I diastolic dysfunction (impaired relaxation).  Right Ventricle: The right ventricular size is normal. No increase in right ventricular wall thickness. Right ventricular systolic function is normal. Tricuspid regurgitation signal is inadequate for assessing PA pressure.  Left Atrium: Left atrial size was normal in size.  Right Atrium: Right atrial size was normal in size.  Pericardium: There is no evidence of pericardial effusion.  Mitral Valve: The mitral valve is normal in structure. Trivial mitral valve regurgitation.  Tricuspid Valve: The tricuspid valve is normal in structure. Tricuspid valve regurgitation is not demonstrated.  Aortic Valve: The aortic valve is tricuspid. Aortic valve regurgitation is trivial. No aortic stenosis is  present.  Pulmonic Valve: The pulmonic valve was normal in structure. Pulmonic valve regurgitation is trivial.  Aorta: Aortic dilatation noted. There is mild dilatation of the aortic root, measuring 39 mm.  Venous: The inferior vena cava is normal in size with greater than 50% respiratory variability, suggesting right atrial pressure of 3 mmHg.  IAS/Shunts: No atrial level shunt detected by color flow Doppler.    LEFT VENTRICLE PLAX 2D LVIDd:         4.10 cm  Diastology LVIDs:         2.80 cm  LV e' medial:    3.64 cm/s LV PW:         1.10 cm  LV E/e' medial:  12.1 LV IVS:        1.30 cm  LV e' lateral:   4.73 cm/s LVOT diam:     2.47 cm  LV E/e' lateral: 9.3 LV SV:         91 LV SV Index:   47 LVOT Area:     4.78 cm    RIGHT VENTRICLE RV Basal diam:  2.60 cm RV S prime:     10.20 cm/s TAPSE (M-mode): 1.9 cm  LEFT ATRIUM             Index       RIGHT ATRIUM           Index LA diam:  4.30 cm 2.24 cm/m  RA Area:     16.90 cm LA Vol (A2C):   49.8 ml 25.91 ml/m RA Volume:   39.50 ml  20.55 ml/m LA Vol (A4C):   40.9 ml 21.28 ml/m LA Biplane Vol: 49.3 ml 25.65 ml/m  AORTIC VALVE LVOT Vmax:   71.00 cm/s LVOT Vmean:  46.200 cm/s LVOT VTI:    0.190 m   AORTA Ao Root diam: 3.90 cm  MITRAL VALVE MV Area (PHT): 2.95 cm    SHUNTS MV Decel Time: 257 msec    Systemic VTI:  0.19 m MV E velocity: 44.20 cm/s  Systemic Diam: 2.47 cm MV A velocity: 85.67 cm/s MV E/A ratio:  0.52  Loralie Champagne MD Electronically signed by Loralie Champagne MD Signature Date/Time: 12/29/2019/6:01:59 PM      Final   Note: Reviewed        Physical Exam  General appearance: Well nourished, well developed, and well hydrated. In no apparent acute distress Mental status: Alert, oriented x 3 (person, place, & time)       Respiratory: No evidence of acute respiratory distress Eyes: PERLA Vitals: BP (!) 141/89 (BP Location: Left Arm, Patient Position: Sitting, Cuff Size: Normal)   Pulse (!)  59   Temp 97.9 F (36.6 C) (Temporal)   Resp 18   Ht 5' 7" (1.702 m)   Wt 180 lb (81.6 kg)   SpO2 98%   BMI 28.19 kg/m  BMI: Estimated body mass index is 28.19 kg/m as calculated from the following:   Height as of this encounter: 5' 7" (1.702 m).   Weight as of this encounter: 180 lb (81.6 kg). Ideal: Ideal body weight: 66.1 kg (145 lb 11.6 oz) Adjusted ideal body weight: 72.3 kg (159 lb 6.9 oz)  Assessment   Status Diagnosis  Controlled Controlled Controlled 1. Chronic pain syndrome   2. Cancer-related pain   3. Prostate cancer ([place])   4. 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 Metastatic cancer to spine ([place])   6. Spine metastasis ([place])   7. Multilevel spine pain      Updated Problems: Problem  Spine Metastasis ([facility])   From prostate cancer   Multilevel Spine Pain   Secondary to metastatic disease   At Maximum Risk for Fall  Chronic Diastolic Chf (Congestive Heart Failure) ([facility])    Plan of Care  Problem-specific:  No problem-specific Assessment & Plan notes found for this encounter.  Mr. Karel Turpen. has a current medication list which includes the following long-term medication(s): gabapentin, labetalol, losartan, omeprazole, oxycodone, [START ON 02/15/2020] oxycodone, and [START ON 03/16/2020] oxycodone.  Pharmacotherapy (Medications Ordered): Meds ordered this encounter  Medications  . oxyCODONE (OXY IR/ROXICODONE) 5 MG immediate release tablet    Sig: Take 1 tablet (5 mg total) by mouth 2 (two) times daily. Must last 30 days.    Dispense:  60 tablet    Refill:  0    Chronic Pain: STOP Act (Not applicable) Fill 1 day early if closed on refill date. Avoid benzodiazepines within 8 hours of opioids  . oxyCODONE (OXY IR/ROXICODONE) 5 MG immediate release tablet    Sig: Take 1 tablet (5 mg total) by mouth 2 (two) times daily. Must last 30 days.    Dispense:  60 tablet    Refill:  0    Chronic Pain: STOP Act (Not applicable) Fill 1 day  early if closed on refill date. Avoid benzodiazepines within 8 hours of  opioids  . oxyCODONE (OXY IR/ROXICODONE) 5 MG immediate release tablet    Sig: Take 1 tablet (5 mg total) by mouth 2 (two) times daily. Must last 30 days.    Dispense:  60 tablet    Refill:  0    Chronic Pain: STOP Act (Not applicable) Fill 1 day early if closed on refill date. Avoid benzodiazepines within 8 hours of opioids   Orders:  No orders of the defined types were placed in this encounter.  Follow-up plan:   Return in about 3 months (around 04/15/2020) for (F2F), (Med Mgmt).      Interventional management options: Planned, scheduled, and/or pending:   Therapeutic/palliative bilateral L2 transforaminal ESI #3 under fluoroscopic guidance and IV sedation   Considering:   Diagnostic IV lidocaine infusion  Diagnostic bilateral lumbar facet NB Possible bilateral lumbar facetRFA   Palliative PRN treatment(s):   Palliative bilateral L2 TFESI #3     Recent Visits Date Type Provider Dept  01/16/20 Office Visit [name], MD [facility]  11/07/19 Telemedicine Molli Barrows, MD [facility]  Showing recent visits within past 90 days and meeting all other requirements Future Appointments Date Type Provider Dept  04/11/20 Appointment [name], MD [facility]  Showing future appointments within next 90 days and meeting all other requirements  I discussed the assessment and treatment plan with the patient. The patient was provided an opportunity to ask questions and all were answered. The patient agreed with the plan and demonstrated an understanding of the instructions.  Patient advised to call back or seek an in-person evaluation if the symptoms or condition worsens.  Duration of encounter: 30 minutes.  Note by: Gaspar Cola, MD Date: 01/16/2020; Time: 5:41 AM

## 2020-01-16 NOTE — Progress Notes (Signed)
Nursing Pain Medication Assessment:  Safety precautions to be maintained throughout the outpatient stay will include: orient to surroundings, keep bed in low position, maintain call bell within reach at all times, provide assistance with transfer out of bed and ambulation.  Medication Inspection Compliance: Pill count conducted under aseptic conditions, in front of the patient. Neither the pills nor the bottle was removed from the patient's sight at any time. Once count was completed pills were immediately returned to the patient in their original bottle.  Medication: Oxycodone IR Pill/Patch Count: 0 of 90 pills remain Pill/Patch Appearance: Markings consistent with prescribed medication Bottle Appearance: Standard pharmacy container. Clearly labeled. Filled Date: 09 / 01 / 2021 Last Medication intake:  Today

## 2020-01-17 DIAGNOSIS — C7951 Secondary malignant neoplasm of bone: Secondary | ICD-10-CM | POA: Insufficient documentation

## 2020-01-17 DIAGNOSIS — M549 Dorsalgia, unspecified: Secondary | ICD-10-CM | POA: Insufficient documentation

## 2020-01-31 ENCOUNTER — Ambulatory Visit: Payer: Commercial Managed Care - HMO | Admitting: Adult Health Nurse Practitioner

## 2020-02-08 ENCOUNTER — Ambulatory Visit (INDEPENDENT_AMBULATORY_CARE_PROVIDER_SITE_OTHER): Payer: Medicare Other | Admitting: Adult Health Nurse Practitioner

## 2020-02-08 ENCOUNTER — Other Ambulatory Visit: Payer: Self-pay

## 2020-02-08 ENCOUNTER — Encounter: Payer: Self-pay | Admitting: Adult Health Nurse Practitioner

## 2020-02-08 VITALS — BP 124/68 | HR 63 | Temp 97.0°F | Ht 67.0 in | Wt 178.6 lb

## 2020-02-08 DIAGNOSIS — I10 Essential (primary) hypertension: Secondary | ICD-10-CM | POA: Diagnosis not present

## 2020-02-08 NOTE — Progress Notes (Signed)
[name] Kitchen MEDICARE ANNUAL WELLNESS VISIT AND FOLLOW UP 60months Assessment:   [name] was seen today for follow-up and medicare wellness.  Diagnoses and all orders for this visit:  Essential hypertension Continue current medications: Losartan 100mg , labetolol 100mg  half tablet BID, Furosemide 40mg , MWF Follow with Cardiology Monitor blood pressure at home; call if consistently over 130/80 Continue DASH diet.   Reminder to go to the ER if any CP, SOB, nausea, dizziness, severe HA, changes vision/speech, left arm numbness and tingling and jaw pain. -     COMPLETE METABOLIC PANEL WITH GFR  Mixed hyperlipidemia Diet controlled Discussed dietary and exercise modifications Low fat diet  Abnormal glucose Discussed dietary and exercise modifications -     COMPLETE METABOLIC PANEL WITH GFR  Vitamin D deficiency Continue supplementation to maintain goal of 70-100 Taking Vitamin D 5,000 IU daily Defer vitamin D level today Continue Vit D supplementation  Paroxysmal atrial fibrillation (HCC) Basa, labetolol 100mg  1/5 tab in am & 1;2 tab in pm Follows with cardiology in [place] and [place] (Dr Percival Spanish)  Hypocalcemia -     COMPLETE METABOLIC PANEL WITH GFR  CKD (chronic kidney disease) stage 3, GFR 30-59 ml/min (HCC) Increase fluids  Avoid NSAIDS Blood pressure control Monitor sugars  Will continue to monitor -     COMPLETE METABOLIC PANEL WITH GFR   Gastroesophageal reflux disease, esophagitis presence not specified  Doing well at this time Continue: prilosec Diet discussed Monitor for triggers Avoid food with high acid content Avoid excessive cafeine Increase water intake  Prostate cancer ([place]) -     COMPLETE METABOLIC PANEL WITH GFR -     Ambulatory referral to Urology for Lupron injections, next due in [name]. Also receiving Delton See, due now Nov.  May need oncology referral if Urology unable to manage. Also receiving calcium infusion with this related to hypokalemia from  [place].  Medication management Continued  Compression fracture of L2 lumbar vertebra, with routine healing, subsequent encounter Prescribed Oxyocodone 5 PRN for pain management.   Reports this is helping with the pain. Continue use of lumbar support and utilizing cane as well for stabilization.   Over 30 minutes of face to face interview,  exam, counseling, chart review, and critical decision making was performed  Future Appointments  Date Time Provider [place]  03/13/2020  2:45 PM Deberah Pelton, NP [facility]  04/11/2020  1:20 PM [name], MD [facility] None  04/17/2020  2:30 PM [facility] None  04/17/2020  3:00 PM Wyatt Portela, MD [facility] None  04/17/2020  3:30 PM [facility] [place] FLUSH [facility] None  09/14/2020 10:00 AM [name], MD [facility] None  02/07/2021  4:00 PM [name], NP [facility] None     Plan:   During the course of the visit the patient was educated and counseled about appropriate screening and preventive services including:    Pneumococcal vaccine   Influenza vaccine  Prevnar 13  Td vaccine  Screening electrocardiogram  Colorectal cancer screening  Diabetes screening  Glaucoma screening  Nutrition counseling    Subjective:  Logan Brown. is a 86 y.o. male who presents for Medicare Annual Wellness Visit and 3 month follow up for HTN, hyperlipidemia, prediabetes, Atrial fib, hypocalcemia, CKD, GERD, vitamin D Def, prostate cancer with spinal mets, and L2 compression fracture.  He lives here 6 months out of the year and 6 months in [place].  There he has Primary Care provider, Cardiologist, Urologist and Oncologist.  Previously had Lupron injections  every 3 months and Xgeva infusions along with calcium replacememt monthly, he is due now, Novenmber for this. He had a fall while walking up the stairs from a lower level.  He had clothes in his hands and slipped and fell.  He  had an elevauation with imaging which resulted in L2 compression fx along with Mets to spine from Prostate cx.  His Oncologist assisted with pain controll and most recently was switched to Fentanyl patches 73mcg and Hydrocodone for pain mangement.  He reports the patches have been working to decrease his pain although it is still there.  He utilizes the Hydrocone PRN and takes at least one a day. He is looking to establish care here in [place] for these health conditions and management.  His blood pressure has been controlled at home, today their BP is BP: 124/68 He does not workout. He denies chest pain, shortness of breath, dizziness.  He is not on cholesterol medication and denies . His cholesterol is not at goal. The cholesterol last visit was:   Lab Results  Component Value Date   CHOL 235 (H) 04/27/2019   HDL 43 04/27/2019   [place]  04/27/2019     Comment:     . LDL cholesterol not calculated. Triglyceride levels greater than 400 mg/dL invalidate calculated LDL results. . Reference range: <100 . Desirable range <100 mg/dL for primary prevention;   <70 mg/dL for patients with CHD or diabetic patients  with > or = 2 CHD risk factors. [name] Kitchen LDL-C is now calculated using the Martin-Hopkins  calculation, which is a validated novel method providing  better accuracy than the Friedewald equation in the  estimation of LDL-C.  Cresenciano Genre et al. Annamaria Helling. 6712;458(09): 2061-2068  (http://education.Quest[employer]ostics.com/faq/FAQ164)    TRIG 487 (H) 04/27/2019   CHOLHDL 5.5 (H) 04/27/2019   He has not been working on diet and exercise for prediabetes, and denies hyperglycemia, hypoglycemia , polydipsia and polyuria. Last A1C in the office was:  Lab Results  Component Value Date   HGBA1C 5.9 (H) 04/27/2019   Last GFR Lab Results  Component Value Date   GFRNONAA 35 (L) 02/08/2020     Lab Results  Component Value Date   GFRAA 41 (L) 02/08/2020   Patient is on Vitamin D supplement.    Lab Results  Component Value Date   VD25OH 25 (L) 04/27/2019      Medication Review:   Current Outpatient Medications (Cardiovascular):  .  furosemide (LASIX) 40 MG tablet, Take 40 mg by mouth every Monday, Wednesday, and Friday. .  labetalol (NORMODYNE) 100 MG tablet, Take 0.5 tablets (50 mg total) by mouth 2 (two) times daily. [name] Kitchen  losartan (COZAAR) 100 MG tablet, Take 1 tablet Daily for BP (Patient taking differently: Take 100 mg by mouth daily. )   Current Outpatient Medications (Analgesics):  .  aspirin EC 81 MG tablet, Take 81 mg by mouth daily. [name] Kitchen  oxyCODONE (OXY IR/ROXICODONE) 5 MG immediate release tablet, Take 1 tablet (5 mg total) by mouth 2 (two) times daily. Must last 30 days. [name] Kitchen  oxyCODONE (OXY IR/ROXICODONE) 5 MG immediate release tablet, Take 1 tablet (5 mg total) by mouth 2 (two) times daily. Must last 30 days. Derrill Memo ON 03/16/2020] oxyCODONE (OXY IR/ROXICODONE) 5 MG immediate release tablet, Take 1 tablet (5 mg total) by mouth 2 (two) times daily. Must last 30 days.  Current Outpatient Medications (Hematological):  [name] Kitchen  Cyanocobalamin (VITAMIN B-12 PO), Take 1,000 mg by mouth in the morning  and at bedtime.  Current Outpatient Medications (Other):  [name] Kitchen  Calcium Carbonate Antacid (CALCIUM CARBONATE PO)*, Take 1,800 mg of elemental calcium by mouth 2 (two) times daily with a meal.  .  Cholecalciferol (VITAMIN D3) 125 MCG (5000 UT) CAPS, Take 5,000 Units by mouth daily.  [name] Kitchen  gabapentin (NEURONTIN) 600 MG tablet, TAKE 1/2 TO 1 (ONE-HALF TO ONE) TABLET BY MOUTH 2 TO 3 TIMES DAILY FOR  CHRONIC  PAIN .  omeprazole (PRILOSEC) 20 MG capsule, Take     1 capsule     Daily     to Prevent Heart burn & Indigestion .  enzalutamide (XTANDI) 40 MG capsule, Take 2 capsules (80 mg total) by mouth daily. * These medications belong to multiple therapeutic classes and are listed under each applicable group.  Allergies: Allergies  Allergen Reactions  . Ace Inhibitors Swelling and Other (See  Comments)    Angioedema  . Hydrocodone Nausea And Vomiting    Current Problems (verified) has HTN (hypertension); Mixed hyperlipidemia; Prediabetes; Vitamin D deficiency; Medication management; Depression, major, in remission ([place]); CKD (chronic kidney disease) stage 3, GFR 30-59 ml/min; Nonischemic cardiomyopathy ([place]); Atrial fibrillation ([place]); Overweight (BMI 25.0-29.9); Prostate cancer (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 ([place]); History of prostate cancer; Osteopenia of spine; Osteopenia determined by x-ray; Osteoarthritis of facet joint of lumbar spine; Osteoarthritis involving multiple joints; DDD (degenerative disc disease), lumbar; Osteoarthritis of hip (Bilateral); Abnormal MRI, lumbar spine; Lumbar central spinal stenosis, w/o neurogenic claudication; Lumbar foraminal stenosis (Bilateral: L4-5) (Right: L1-2, L5-S1) (Left: L2-3, L3-4); Lumbar facet hypertrophy; Lumbar facet joint syndrome (Bilateral); Metastatic cancer to spine [facility]); History of kyphoplasty (L2); Bradycardia by electrocardiogram; Orthostatic hypotension; Chronic diastolic CHF (congestive heart failure) ([place]); At maximum risk for fall; Spine metastasis ([place]); and Multilevel spine pain on their problem list.  Screening Tests Immunization History  Administered Date(s) Administered  . Influenza, High Dose Seasonal PF 02/19/2015, 01/08/2017, 03/25/2018, 11/18/2018  . PFIZER SARS-COV-2 Vaccination 05/09/2019, 06/07/2019    Preventative  care: Last colonoscopy:Never, Declines  Prior vaccinations: TD or Tdap:11/2017  Influenza: 12/2019 Pneumococcal: unsure no records Prevnar13: 11/2017  Shingles/Zostavax: Due SARS-COV2Booster 12-10-2019   Names of Other Physician/Practitioners you currently use: 1. [facility] here for primary care 2. Dr Katy Fitch, eye doctor, DUE 3. Dentures, does do see dentist. Dentures fit well.  Patient Care Team: [name], MD as PCP - General (Internal Medicine) Minus Breeding, MD as PCP - Cardiology (Cardiology) Wyatt Portela, MD as Consulting Physician (Oncology) [name], MD as Referring Physician (Pain Medicine) Berneta Sages, NP as Nurse Practitioner (Adult Health Nurse Practitioner)    Surgical: He  has a past surgical history that includes Eye surgery (Bilateral); Cataract extraction; Prostate surgery; and Wrist foreign body removal. Family His family history is not on file. Social history  He reports that he quit smoking about 44 years ago. He has never used smokeless tobacco. He reports that he does not drink alcohol and does not use drugs.  MEDICARE WELLNESS OBJECTIVES: Physical activity: Current Exercise Habits: Home exercise routine, Intensity: Mild, Exercise limited by: orthopedic condition(s) Cardiac risk factors:  Cardiac Risk Factors include: advanced age (>33men, >28 women);hypertension Depression/mood screen:   Depression screen [facility] 2/9 02/22/2020  Decreased Interest 0  Down, Depressed, Hopeless 0  PHQ - 2 Score 0  Altered sleeping -  Tired, decreased energy -  Change in appetite -  Feeling bad or failure about yourself  -  Trouble concentrating -  Moving slowly or fidgety/restless -  Suicidal thoughts -  PHQ-9 Score -  Difficult doing work/chores -    ADLs:  In your present state of health, do you have any difficulty performing the following activities: 02/22/2020 04/28/2019  Hearing? Y N  Vision? N N   Difficulty concentrating or making decisions? N N  Walking or climbing stairs? N N  Dressing or bathing? N N  Doing errands, shopping? Y N  Preparing Food and eating ? N -  Using the Toilet? N -  In the past six months, have you accidently leaked urine? N -  Do you have problems with loss of bowel control? N -  Managing your Medications? N -  Managing your Finances? Y -  Housekeeping or managing your Housekeeping? N -  Some recent data might be hidden     Cognitive Testing  Alert? Yes  Normal Appearance?Yes  Oriented to person? Yes  Place? Yes   Time? Yes  Recall of three objects?  Yes  Can perform simple calculations? Yes  Displays appropriate judgment?Yes  Can read the correct time from a watch face?Yes  EOL planning: Does Patient Have a Medical Advance Directive?: Yes Type of Advance Directive: [place] will [place] in Chart?: No - copy requested   Objective:   Today's Vitals   02/08/20 1553  BP: 124/68  Pulse: 63  Temp: (!) 97 F (36.1 C)  SpO2: 95%  Weight: 178 lb 9.6 oz (81 kg)  Height: 5\' 7"  (1.702 m)  PainSc: 6   PainLoc: Back   Body mass index is 27.97 kg/m.  Review of Systems  Constitutional: Negative for diaphoresis, malaise/fatigue and weight loss.  HENT: Negative for congestion, ear discharge, ear pain, hearing loss, nosebleeds, sinus pain, sore throat and tinnitus.   Eyes: Negative for blurred vision, double vision, photophobia, pain, discharge and redness.  Respiratory: Negative for cough, hemoptysis, sputum production, shortness of breath and wheezing.   Cardiovascular: Negative for chest pain, palpitations, orthopnea, claudication, leg swelling and PND.  Gastrointestinal: Negative for abdominal pain, blood in stool, constipation, diarrhea, heartburn, melena, nausea and vomiting.  Genitourinary: Negative for dysuria, flank pain, frequency, hematuria and urgency.  Musculoskeletal: Positive  for back pain and falls. Negative for joint pain, myalgias and neck pain.  Skin: Positive for itching. Negative for rash.       Endorese itching and burning to scrotal area.  Mostly after showering.  Reports he is using cream and pill, unsure of name.  Neurological: Negative for dizziness, tingling, tremors, sensory change, speech change, loss of consciousness, weakness and headaches.  Endo/Heme/Allergies: Negative for environmental allergies and polydipsia. Does not bruise/bleed easily.  Psychiatric/Behavioral: Negative for depression, hallucinations, memory loss, substance abuse and suicidal ideas. The patient is nervous/anxious. The patient does not have insomnia.     General appearance: alert, no distress, WD/WN, male HEENT: normocephalic, sclerae anicteric, TMs pearly, nares patent, no discharge or erythema, pharynx normal Oral cavity: MMM, no lesions Neck: supple, no lymphadenopathy, no thyromegaly, no masses Heart: RRR, normal S1, S2, no murmurs Lungs: CTA bilaterally, no wheezes, rhonchi, or rales Abdomen: +  bs, soft, non tender, non distended, no masses, no hepatomegaly, no splenomegaly Skin: no erythema rash or exudate noted to scrotal area. Musculoskeletal: nontender, no swelling, no obvious deformity Extremities: no edema, no cyanosis, no clubbing Pulses: 2+ symmetric, upper and lower extremities, normal cap refill Neurological: alert, oriented x 3, CN2-12 intact, strength normal upper extremities and lower extremities, sensation normal throughout, DTRs 2+ throughout, no cerebellar signs, gait normal Psychiatric: normal affect, behavior normal, pleasant   Medicare Attestation I have personally reviewed: The patient's medical and social history Their use of alcohol, tobacco or illicit drugs Their current medications and supplements The patient's functional ability including ADLs,fall risks, home safety risks, cognitive, and hearing and visual impairment Diet and physical  activities Evidence for depression or mood disorders  The patient's weight, height, BMI, and visual acuity have been recorded in the chart.  I have made referrals, counseling, and provided education to the patient based on review of the above and I have provided the patient with a written personalized care plan for preventive services.     [name], NP   02/08/2020

## 2020-02-08 NOTE — Patient Instructions (Addendum)
   Try some Metamucil, powder, tablet or biscuits.  This will help to bulk stool to reduce diarrhea.    Logan Brown , Thank you for taking time to come for your Medicare Wellness Visit. I appreciate your ongoing commitment to your health goals. Please review the following plan we discussed and let me know if I can assist you in the future.   These are the goals we discussed: Increase water intake    This is a list of the screening recommended for you and due dates:  Health Maintenance  Topic Date Due  . Tetanus Vaccine  Never done  . Pneumonia vaccines (2 of 2 - PPSV23) 09/09/2016  . Flu Shot  10/09/2019  . COVID-19 Vaccine  Completed      GENERAL HEALTH GOALS  Know what a healthy weight is for you (roughly BMI <25) and aim to maintain this  Aim for 7+ servings of fruits and vegetables daily  70-80+ fluid ounces of water or unsweet tea for healthy kidneys  Limit to max 1 drink of alcohol per day; avoid smoking/tobacco  Limit animal fats in diet for cholesterol and heart health - choose grass fed whenever available  Avoid highly processed foods, and foods high in saturated/trans fats  Aim for low stress - take time to unwind and care for your mental health  Aim for 150 min of moderate intensity exercise weekly for heart health, and weights twice weekly for bone health  Aim for 7-9 hours of sleep daily

## 2020-02-09 ENCOUNTER — Telehealth: Payer: Self-pay

## 2020-02-09 LAB — COMPLETE METABOLIC PANEL WITH GFR
AG Ratio: 1.8 (calc) (ref 1.0–2.5)
ALT: 13 U/L (ref 9–46)
AST: 18 U/L (ref 10–35)
Albumin: 4.2 g/dL (ref 3.6–5.1)
Alkaline phosphatase (APISO): 55 U/L (ref 35–144)
BUN/Creatinine Ratio: 16 (calc) (ref 6–22)
BUN: 27 mg/dL — ABNORMAL HIGH (ref 7–25)
CO2: 28 mmol/L (ref 20–32)
Calcium: 9.5 mg/dL (ref 8.6–10.3)
Chloride: 107 mmol/L (ref 98–110)
Creat: 1.72 mg/dL — ABNORMAL HIGH (ref 0.70–1.11)
GFR, Est African American: 41 mL/min/{1.73_m2} — ABNORMAL LOW (ref >=60)
GFR, Est Non African American: 35 mL/min/{1.73_m2} — ABNORMAL LOW (ref >=60)
Globulin: 2.3 g/dL (calc) (ref 1.9–3.7)
Glucose, Bld: 100 mg/dL — ABNORMAL HIGH (ref 65–99)
Potassium: 5 mmol/L (ref 3.5–5.3)
Sodium: 140 mmol/L (ref 135–146)
Total Bilirubin: 0.5 mg/dL (ref 0.2–1.2)
Total Protein: 6.5 g/dL (ref 6.1–8.1)

## 2020-02-09 LAB — CBC WITH DIFFERENTIAL/PLATELET
Absolute Monocytes: 538 cells/uL (ref 200–950)
Basophils Absolute: 29 cells/uL (ref 0–200)
Basophils Relative: 0.6 %
Eosinophils Absolute: 158 cells/uL (ref 15–500)
Eosinophils Relative: 3.3 %
HCT: 35.8 % — ABNORMAL LOW (ref 38.5–50.0)
Hemoglobin: 12.3 g/dL — ABNORMAL LOW (ref 13.2–17.1)
Lymphs Abs: 1762 cells/uL (ref 850–3900)
MCH: 34.7 pg — ABNORMAL HIGH (ref 27.0–33.0)
MCHC: 34.4 g/dL (ref 32.0–36.0)
MCV: 101.1 fL — ABNORMAL HIGH (ref 80.0–100.0)
MPV: 11 fL (ref 7.5–12.5)
Monocytes Relative: 11.2 %
Neutro Abs: 2314 cells/uL (ref 1500–7800)
Neutrophils Relative %: 48.2 %
Platelets: 142 10*3/uL (ref 140–400)
RBC: 3.54 10*6/uL — ABNORMAL LOW (ref 4.20–5.80)
RDW: 13.3 % (ref 11.0–15.0)
Total Lymphocyte: 36.7 %
WBC: 4.8 10*3/uL (ref 3.8–10.8)

## 2020-02-14 ENCOUNTER — Telehealth: Payer: Self-pay

## 2020-02-14 ENCOUNTER — Telehealth: Payer: Self-pay | Admitting: *Deleted

## 2020-02-14 ENCOUNTER — Other Ambulatory Visit: Payer: Self-pay | Admitting: *Deleted

## 2020-02-14 MED ORDER — XTANDI 40 MG PO CAPS
80.0000 mg | ORAL_CAPSULE | Freq: Every day | ORAL | 1 refills | Status: DC
Start: 2020-02-14 — End: 2020-02-14

## 2020-02-14 MED ORDER — XTANDI 40 MG PO CAPS
80.0000 mg | ORAL_CAPSULE | Freq: Every day | ORAL | 1 refills | Status: DC
Start: 2020-02-14 — End: 2020-02-15

## 2020-02-14 NOTE — Telephone Encounter (Signed)
The patient called in stating he needed his medicine. I suggested that he call the pharmacy to check re his refills because he should have enough to last until February.

## 2020-02-14 NOTE — Telephone Encounter (Signed)
Oral Oncology Patient Advocate Encounter  Met patient in lobby to complete re-enrollment application for [facility] [employer] in an effort to reduce patient's out of pocket expense for Xtandi to $0.    Application completed and faxed to [phone].   Xtandi patient assistance phone number for follow up is [phone].   This encounter will be updated until final determination.    Logan Brown Patient [place] Phone [phone] Fax [phone] 02/14/2020 3:26 PM

## 2020-02-14 NOTE — Telephone Encounter (Signed)
Received vm call from pt requesting refill.  Called pt & he is out of his [place] Phy & says it needs to come from manufacturer.  Verified with Oral Chemo tech/Elizabeth.  Will send script to [place] at [facility] [phone].

## 2020-02-15 ENCOUNTER — Other Ambulatory Visit: Payer: Self-pay | Admitting: Pharmacist

## 2020-02-15 DIAGNOSIS — C61 Malignant neoplasm of prostate: Secondary | ICD-10-CM

## 2020-02-15 MED ORDER — XTANDI 40 MG PO CAPS: 80.0000 mg | ORAL_CAPSULE | Freq: Every day | ORAL | 1 refills | Status: DC

## 2020-02-15 NOTE — Progress Notes (Signed)
Oral Oncology Pharmacist Encounter  Notified by [place] that [facility] faxed on 02/14/20 was still not received. Represenative stated that redirecting Xtandi Rx via escribe to [facility] would ensure faster processing and delivery to patient. Prescription refill redirected to [facility].  Leron Croak, PharmD, BCPS Hematology/Oncology Clinical Pharmacist [facility] [phone] 02/15/2020 1:42 PM

## 2020-02-24 ENCOUNTER — Ambulatory Visit: Admitting: Urology

## 2020-02-24 NOTE — Progress Notes (Signed)
* * *      NIK, GORRELL **DOB:** [birth year] (86 yo M) **Acc No.** [id number] **DOS:**  02/24/2020    ---       **[name]**    ------    79 Y old Male, DOB: 01/06/1932    [address] [place], [place], [place] [address]    Home: [phone]    Provider: [name]        * * *    Telephone Encounter    ---    Answered by  [name] Date: 02/24/2020       Time: 08:29 AM    Reason  Status Update    ------            Action Taken                     [name] 02/24/2020 08:29:28 AM > not urgent, but can you call the pt's daughter to get an update on his status. Is he up here or still in [place]? If in [place], is he getting GU care there? thanks      Lowery,Aprile  02/27/2020 9:14:11 AM > Called daughter- lmom asking her to call with status update on pt.      Lowery,Aprile  02/27/2020 11:23:09 AM > Daughter called back- He is still in [place], she said as far as knows he is getting his lurpon shot, but she is just going by what he tells her... I called pt directly- left him a detailed message asking how he was doing and if he call the office with an update.                    * * *                ---          * * *        Provider: [name] 02/24/2020    ---    Note generated by eClinicalWorks EMR/PM Software ([email or web address])

## 2020-03-07 NOTE — Telephone Encounter (Signed)
Patient has been re-enrolled in [employer] support solutions for Xtandi 03/10/20-03/09/21  Cruzita Lederer CPHT Specialty Pharmacy Patient Advocate [facility] Phone [phone] Fax [phone] 03/07/2020 11:29 AM

## 2020-03-08 ENCOUNTER — Telehealth: Payer: Self-pay

## 2020-03-08 NOTE — Telephone Encounter (Signed)
Reviewed blood pressure log and recommendation with patient. No changes at this time. Patient verbalized understanding.

## 2020-03-12 NOTE — Progress Notes (Unsigned)
Cardiology Clinic Note   Patient Name: Logan Brown. Date of Encounter: 03/13/2020  Primary Care Provider:  [name], MD Primary Cardiologist:  Minus Breeding, MD  Patient Profile    Logan Brown. 87 year old male presents the clinic today for follow-up evaluation of his shortness of breath, dizziness, and paroxysmal atrial fibrillation.  Past Medical History    Past Medical History:  Diagnosis Date  . Arthritis   . Atrial fibrillation ([place])   . Bone cancer ([place])   . Cardiomyopathy ([place])   . Chronic kidney disease   . Hyperlipidemia   . Hypertension   . Prostate cancer ([place]) 1997  . Unstable gait 04/16/2018   unstable gait in home   Past Surgical History:  Procedure Laterality Date  . CATARACT EXTRACTION    . EYE SURGERY Bilateral    IOL/CE on Lt in 1998 and Rt in 2011.  [name] Kitchen PROSTATE SURGERY    . WRIST FOREIGN BODY REMOVAL     1957 glass    Allergies  Allergies  Allergen Reactions  . Ace Inhibitors Swelling and Other (See Comments)    Angioedema  . Hydrocodone Nausea And Vomiting    History of Present Illness    Logan Brown has a PMH of cardiomyopathy (EF 25% improved to 50-55% in 2017, paroxysmal atrial fibrillation (Xarelto stopped due to hematuria), dizziness, renal insufficiency.  He had a long-term cardiac event monitor that showed no significant arrhythmias.  Most recent echocardiogram demonstrated well preserved EF.  He was last seen by Dr. Percival Spanish on 01/10/2020.  He expressed some confusion about his home medications.  They were reviewed carefully.  He presented with a companion who stated he did not want to take his medications and was not sure if he was taking them or not.  He denied increased dyspnea.  He denied PND and orthopnea.  He was limited in his physical activity due to his back pain but did do occasional ambulation.  He reported occasional dizziness.  His dizziness/hot flashes were attributed to his Xtandi.  He also  reported similar symptoms with straining during bowel movements.  He denied palpitations, chest pain, neck pain, and weight gain or lower extremity edema.  He presents the clinic today for follow-up evaluation states he does notice some pain in his back which he has been dealing with for some time.  He has been seen by pain management.  He is also seen by oncology for cancer in his back.  His blood pressures been well controlled and it is 130/60 today.  His pulse is 62.  He does state that he is somewhat unstable on his feet when he walks too far.  This is related to his cancer treatment/Xtandi.  He reports over short distances he does fairly well with his cane.  I recommended that he use his Rollator for longer distances.  I will have him continue to be a heart healthy low-sodium diet, continue his current medications, and follow-up in 6 months.  Today he denies chest pain, shortness of breath, lower extremity edema, fatigue, palpitations, melena, hematuria, hemoptysis, diaphoresis, weakness, presyncope, syncope, orthopnea, and PND.  Home Medications    Prior to Admission medications   Medication Sig Start Date End Date Taking? Authorizing Provider  aspirin EC 81 MG tablet Take 81 mg by mouth daily.    [provider]  Calcium Carbonate Antacid (CALCIUM CARBONATE PO) Take 1,800 mg of elemental calcium by mouth 2 (two) times daily with a meal.  [provider]  Cholecalciferol (VITAMIN D3) 125 MCG (5000 UT) CAPS Take 5,000 Units by mouth daily.     [provider]  Cyanocobalamin (VITAMIN B-12 PO) Take 1,000 mg by mouth in the morning and at bedtime.    [provider]  enzalutamide Gillermina Phy) 40 MG capsule Take 2 capsules (80 mg total) by mouth daily. 02/15/20   Wyatt Portela, MD  furosemide (LASIX) 40 MG tablet Take 40 mg by mouth every Monday, Wednesday, and Friday. 04/13/19   [provider]  gabapentin (NEURONTIN) 600 MG tablet TAKE 1/2 TO 1 (ONE-HALF  TO ONE) TABLET BY MOUTH 2 TO 3 TIMES DAILY FOR  CHRONIC  PAIN 11/15/19   Liane Comber, NP  labetalol (NORMODYNE) 100 MG tablet Take 0.5 tablets (50 mg total) by mouth 2 (two) times daily. 01/10/20   Minus Breeding, MD  losartan (COZAAR) 100 MG tablet Take 1 tablet Daily for BP Patient taking differently: Take 100 mg by mouth daily.  05/06/19   [name], MD  omeprazole (PRILOSEC) 20 MG capsule Take     1 capsule     Daily     to Prevent Heart burn & Indigestion 12/21/19   [name], MD  oxyCODONE (OXY IR/ROXICODONE) 5 MG immediate release tablet Take 1 tablet (5 mg total) by mouth 2 (two) times daily. Must last 30 days. 01/16/20 02/15/20  [name], MD  oxyCODONE (OXY IR/ROXICODONE) 5 MG immediate release tablet Take 1 tablet (5 mg total) by mouth 2 (two) times daily. Must last 30 days. 02/15/20 03/16/20  [name], MD  oxyCODONE (OXY IR/ROXICODONE) 5 MG immediate release tablet Take 1 tablet (5 mg total) by mouth 2 (two) times daily. Must last 30 days. 03/16/20 04/15/20  [name], MD    Family History    History reviewed. No pertinent family history. He indicated that his mother is deceased. He indicated that his father is deceased. He indicated that his sister is alive. He indicated that his brother is alive. He indicated that his son is alive.  Social History    Social History   Socioeconomic History  . Marital status: Single    Spouse name: Not on file  . Number of children: Not on file  . Years of education: Not on file  . Highest education level: Not on file  Occupational History  . Not on file  Tobacco Use  . Smoking status: Former Smoker    Quit date: 03/14/1975    Years since quitting: 45.0  . Smokeless tobacco: Never Used  Vaping Use  . Vaping Use: Never used  Substance and Sexual Activity  . Alcohol use: No    Alcohol/week: 0.0 standard drinks    Comment: occ  . Drug use: No  . Sexual activity: Not on file  Other Topics Concern  . Not  on file  Social History Narrative  . Not on file   Social Determinants of Health   Financial Resource Strain: Not on file  Food Insecurity: Not on file  Transportation Needs: Not on file  Physical Activity: Not on file  Stress: Not on file  Social Connections: Not on file  Intimate Partner Violence: Not on file     Review of Systems    General:  No chills, fever, night sweats or weight changes.  Cardiovascular:  No chest pain, dyspnea on exertion, edema, orthopnea, palpitations, paroxysmal nocturnal dyspnea. Dermatological: No rash, lesions/masses Respiratory: No cough, dyspnea Urologic: No hematuria, dysuria Abdominal:   No  nausea, vomiting, diarrhea, bright red blood per rectum, melena, or hematemesis Neurologic:  No visual changes, wkns, changes in mental status. All other systems reviewed and are otherwise negative except as noted above.  Physical Exam    VS:  BP 130/60 (BP Location: Left Arm, Patient Position: Sitting, Cuff Size: Normal)   Pulse 62   Ht 5\' 7"  (1.702 m)   Wt 176 lb (79.8 kg)   SpO2 98%   BMI 27.57 kg/m  , BMI Body mass index is 27.57 kg/m. GEN: Well nourished, well developed, in no acute distress. HEENT: normal. Neck: Supple, no JVD, carotid bruits, or masses. Cardiac: RRR, no murmurs, rubs, or gallops. No clubbing, cyanosis, edema.  Radials/DP/PT 2+ and equal bilaterally.  Respiratory:  Respirations regular and unlabored, clear to auscultation bilaterally. GI: Soft, nontender, nondistended, BS + x 4. MS: no deformity or atrophy. Skin: warm and dry, no rash. Neuro:  Strength and sensation are intact. Psych: Normal affect.  Accessory Clinical Findings    Recent Labs: 04/27/2019: Magnesium 2.2; TSH 3.06 02/08/2020: ALT 13; BUN 27; Creat 1.72; Hemoglobin 12.3; Platelets 142; Potassium 5.0; Sodium 140   Recent Lipid Panel    Component Value Date/Time   CHOL 235 (H) 04/27/2019 1514   TRIG 487 (H) 04/27/2019 1514   HDL 43 04/27/2019 1514    CHOLHDL 5.5 (H) 04/27/2019 1514   VLDL NOT CALC 03/01/2015 1742   LDLCALC  04/27/2019 1514     Comment:     . LDL cholesterol not calculated. Triglyceride levels greater than 400 mg/dL invalidate calculated LDL results. . Reference range: <100 . Desirable range <100 mg/dL for primary prevention;   <70 mg/dL for patients with CHD or diabetic patients  with > or = 2 CHD risk factors. 04/29/2019 LDL-C is now calculated using the Martin-Hopkins  calculation, which is a validated novel method providing  better accuracy than the Friedewald equation in the  estimation of LDL-C.  [name] Kitchen et al. Horald Pollen. Lenox Ahr): 2061-2068  (http://education.[employer].com/faq/FAQ164)     ECG personally reviewed by me today-none today.  Echocardiogram 12/29/2019 IMPRESSIONS    1. Left ventricular ejection fraction, by estimation, is 55 to 60%. The  left ventricle has normal function. The left ventricle demonstrates  regional wall motion abnormalities with basal inferior hypokinesis. There  is mild left ventricular hypertrophy.  Left ventricular diastolic parameters are consistent with Grade I  diastolic dysfunction (impaired relaxation).  2. Right ventricular systolic function is normal. The right ventricular  size is normal. Tricuspid regurgitation signal is inadequate for assessing  PA pressure.  3. The mitral valve is normal in structure. Trivial mitral valve  regurgitation.  4. The aortic valve is tricuspid. Aortic valve regurgitation is trivial.  No aortic stenosis is present.  5. Aortic dilatation noted. There is mild dilatation of the aortic root,  measuring 39 mm.  6. The inferior vena cava is normal in size with greater than 50%  respiratory variability, suggesting right atrial pressure of 3 mmHg.  Assessment & Plan   1.  Cardiomyopathy-no increased DOE or activity intolerance.  Echocardiogram 12/29/2019 showed an EF of 55-60%, G1 DD. Continue furosemide, losartan, Heart  healthy low-sodium diet-salty 6 given Increase physical activity as tolerated  Essential hypertension-BP today 130/60.  Well-controlled on Continue losartan, labetalol Heart healthy low-sodium diet-salty 6 given Increase physical activity as tolerated  Atrial fibrillation-no episodes of palpitations or accelerated heart rate.  Previous cardiac event monitor showed no atrial fibrillation. Continue labetalol Heart healthy low-sodium diet-salty 6 given Increase  physical activity as tolerated  Carotid stenosis-denies episodes of increased dizziness, headache, neurological sequela.  7/18 carotid ultrasound showed mild plaque.  No plans for further testing.  Dizziness-no recent episodes of dizziness/lightheadedness.  Labetalol previously reduced due to his bradycardia.  Plan to further reduce labetalol if dizziness continues. Continue to monitor.  Dyspnea-stable.  At baseline with chronic dyspnea. Continue to monitor.  CKD stage III-creatinine 1.72 on 02/08/2020 Follows with PCP  Disposition: Follow-up with Dr. Percival Spanish in 6 months.   Jossie Ng. Hashir Deleeuw NP-C    03/13/2020, 3:22 PM [facility] [place] [address] Office [phone] Fax [phone]  Notice: This dictation was prepared with Dragon dictation along with smaller phrase technology. Any transcriptional errors that result from this process are unintentional and may not be corrected upon review.

## 2020-03-13 ENCOUNTER — Other Ambulatory Visit: Payer: Self-pay

## 2020-03-13 ENCOUNTER — Ambulatory Visit (INDEPENDENT_AMBULATORY_CARE_PROVIDER_SITE_OTHER): Payer: Medicare Other | Admitting: General Practice

## 2020-03-13 ENCOUNTER — Encounter: Payer: Self-pay | Admitting: General Practice

## 2020-03-13 VITALS — BP 130/60 | HR 62 | Ht 67.0 in | Wt 176.0 lb

## 2020-03-13 DIAGNOSIS — I4891 Unspecified atrial fibrillation: Secondary | ICD-10-CM | POA: Diagnosis not present

## 2020-03-13 DIAGNOSIS — I43 Cardiomyopathy in diseases classified elsewhere: Secondary | ICD-10-CM

## 2020-03-13 DIAGNOSIS — I10 Essential (primary) hypertension: Secondary | ICD-10-CM

## 2020-03-13 DIAGNOSIS — R0602 Shortness of breath: Secondary | ICD-10-CM | POA: Diagnosis not present

## 2020-03-13 DIAGNOSIS — R42 Dizziness and giddiness: Secondary | ICD-10-CM

## 2020-03-13 DIAGNOSIS — N1831 Chronic kidney disease, stage 3a: Secondary | ICD-10-CM

## 2020-03-13 NOTE — Patient Instructions (Addendum)
Medication Instructions:  The current medical regimen is effective;  continue present plan and medications as directed. Please refer to the Current Medication list given to you today.  *If you need a refill on your cardiac medications before your next appointment, please call your pharmacy*  Lab Work:   Testing/Procedures:  NONE    NONE  Special Instructions  PLEASE READ AND FOLLOW SALTY 6-ATTACHED-1,800mg  daily  PLEASE MAINTAIN PHYSICAL ACTIVITY AS TOLERATED  Follow-Up: Your next appointment:  6 month(s) In Person with Rollene Rotunda, MD OR IF UNAVAILABLE JESSE CLEAVER, FNP-C. Please call our office 2 months in advance to schedule this appointment   At [facility], you and your health needs are our priority.  As part of our continuing mission to provide you with exceptional heart care, we have created designated Provider Care Teams.  These Care Teams include your primary Cardiologist (physician) and Advanced Practice Providers (APPs -  Physician Assistants and Nurse Practitioners) who all work together to provide you with the care you need, when you need it.            6 SALTY THINGS TO AVOID     1,800MG  DAILY

## 2020-04-11 ENCOUNTER — Ambulatory Visit
Admission: RE | Admit: 2020-04-11 | Discharge: 2020-04-11 | Disposition: A | Discharge status: Home / Self Care | Payer: Medicare HMO | Source: Ambulatory Visit | Attending: Pain Medicine | Admitting: Pain Medicine

## 2020-04-11 ENCOUNTER — Ambulatory Visit (HOSPITAL_BASED_OUTPATIENT_CLINIC_OR_DEPARTMENT_OTHER): Payer: Medicare HMO | Admitting: Pain Medicine

## 2020-04-11 ENCOUNTER — Encounter: Payer: Self-pay | Admitting: Pain Medicine

## 2020-04-11 ENCOUNTER — Other Ambulatory Visit: Payer: Self-pay

## 2020-04-11 VITALS — BP 172/92 | HR 58 | Temp 97.4°F | Resp 18 | Ht 66.0 in | Wt 175.0 lb

## 2020-04-11 DIAGNOSIS — M48061 Spinal stenosis, lumbar region without neurogenic claudication: Secondary | ICD-10-CM | POA: Insufficient documentation

## 2020-04-11 DIAGNOSIS — M4802 Spinal stenosis, cervical region: Secondary | ICD-10-CM | POA: Diagnosis not present

## 2020-04-11 DIAGNOSIS — G8929 Other chronic pain: Secondary | ICD-10-CM

## 2020-04-11 DIAGNOSIS — C61 Malignant neoplasm of prostate: Secondary | ICD-10-CM

## 2020-04-11 DIAGNOSIS — M4312 Spondylolisthesis, cervical region: Secondary | ICD-10-CM | POA: Insufficient documentation

## 2020-04-11 DIAGNOSIS — F1120 Opioid dependence, uncomplicated: Secondary | ICD-10-CM | POA: Diagnosis not present

## 2020-04-11 DIAGNOSIS — G894 Chronic pain syndrome: Secondary | ICD-10-CM

## 2020-04-11 DIAGNOSIS — C7951 Secondary malignant neoplasm of bone: Secondary | ICD-10-CM | POA: Insufficient documentation

## 2020-04-11 DIAGNOSIS — G893 Neoplasm related pain (acute) (chronic): Secondary | ICD-10-CM

## 2020-04-11 DIAGNOSIS — M545 Low back pain, unspecified: Secondary | ICD-10-CM

## 2020-04-11 DIAGNOSIS — M5136 Other intervertebral disc degeneration, lumbar region: Secondary | ICD-10-CM | POA: Diagnosis not present

## 2020-04-11 DIAGNOSIS — S32020S Wedge compression fracture of second lumbar vertebra, sequela: Secondary | ICD-10-CM | POA: Insufficient documentation

## 2020-04-11 DIAGNOSIS — Z79899 Other long term (current) drug therapy: Secondary | ICD-10-CM | POA: Insufficient documentation

## 2020-04-11 DIAGNOSIS — M546 Pain in thoracic spine: Secondary | ICD-10-CM | POA: Diagnosis not present

## 2020-04-11 DIAGNOSIS — M50321 Other cervical disc degeneration at C4-C5 level: Secondary | ICD-10-CM | POA: Insufficient documentation

## 2020-04-11 DIAGNOSIS — M47812 Spondylosis without myelopathy or radiculopathy, cervical region: Secondary | ICD-10-CM | POA: Diagnosis not present

## 2020-04-11 MED ORDER — PREDNISONE 20 MG PO TABS: ORAL_TABLET | ORAL | 0 refills | Status: AC

## 2020-04-11 MED ORDER — OXYCODONE HCL 5 MG PO TABS: 5.0000 mg | ORAL_TABLET | Freq: Four times a day (QID) | ORAL | 0 refills | Status: DC | PRN

## 2020-04-11 NOTE — Patient Instructions (Addendum)
____________________________________________________________________________________________  Medication Rules  Purpose: To inform patients, and their family members, of our rules and regulations.  Applies to: All patients receiving prescriptions (written or electronic).  Pharmacy of record: Pharmacy where electronic prescriptions will be sent. If written prescriptions are taken to a different pharmacy, please inform the nursing staff. The pharmacy listed in the electronic medical record should be the one where you would like electronic prescriptions to be sent.  Electronic prescriptions: In compliance with the [place] Strengthen Opioid Misuse Prevention (STOP) Act of 2017 (Session Law 2017-74/H243), effective March 10, 2018, all controlled substances must be electronically prescribed. Calling prescriptions to the pharmacy will cease to exist.  Prescription refills: Only during scheduled appointments. Applies to all prescriptions.  NOTE: The following applies primarily to controlled substances (Opioid* Pain Medications).   Type of encounter (visit): For patients receiving controlled substances, face-to-face visits are required. (Not an option or up to the patient.)  Patient's responsibilities: 1. Pain Pills: Bring all pain pills to every appointment (except for procedure appointments). 2. Pill Bottles: Bring pills in original pharmacy bottle. Always bring the newest bottle. Bring bottle, even if empty. 3. Medication refills: You are responsible for knowing and keeping track of what medications you take and those you need refilled. The day before your appointment: write a list of all prescriptions that need to be refilled. The day of the appointment: give the list to the admitting nurse. Prescriptions will be written only during appointments. No prescriptions will be written on procedure days. If you forget a medication: it will not be "Called in", "Faxed", or "electronically sent".  You will need to get another appointment to get these prescribed. No early refills. Do not call asking to have your prescription filled early. 4. Prescription Accuracy: You are responsible for carefully inspecting your prescriptions before leaving our office. Have the discharge nurse carefully go over each prescription with you, before taking them home. Make sure that your name is accurately spelled, that your address is correct. Check the name and dose of your medication to make sure it is accurate. Check the number of pills, and the written instructions to make sure they are clear and accurate. Make sure that you are given enough medication to last until your next medication refill appointment. 5. Taking Medication: Take medication as prescribed. When it comes to controlled substances, taking less pills or less frequently than prescribed is permitted and encouraged. Never take more pills than instructed. Never take medication more frequently than prescribed.  6. Inform other Doctors: Always inform, all of your healthcare providers, of all the medications you take. 7. Pain Medication from other Providers: You are not allowed to accept any additional pain medication from any other Doctor or Healthcare provider. There are two exceptions to this rule. (see below) In the event that you require additional pain medication, you are responsible for notifying us, as stated below. 8. Cough Medicine: Often these contain an opioid, such as codeine or hydrocodone. Never accept or take cough medicine containing these opioids if you are already taking an opioid* medication. The combination may cause respiratory failure and death. 9. Medication Agreement: You are responsible for carefully reading and following our Medication Agreement. This must be signed before receiving any prescriptions from our practice. Safely store a copy of your signed Agreement. Violations to the Agreement will result in no further prescriptions.  (Additional copies of our Medication Agreement are available upon request.) 10. Laws, Rules, & Regulations: All patients are expected to follow all   Federal and State Laws, Statutes, Rules, & Regulations. Ignorance of the Laws does not constitute a valid excuse.  11. Illegal drugs and Controlled Substances: The use of illegal substances (including, but not limited to marijuana and its derivatives) and/or the illegal use of any controlled substances is strictly prohibited. Violation of this rule may result in the immediate and permanent discontinuation of any and all prescriptions being written by our practice. The use of any illegal substances is prohibited. 12. Adopted CDC guidelines & recommendations: Target dosing levels will be at or below 60 MME/day. Use of benzodiazepines** is not recommended.  Exceptions: There are only two exceptions to the rule of not receiving pain medications from other Healthcare Providers. 1. Exception #1 (Emergencies): In the event of an emergency (i.e.: accident requiring emergency care), you are allowed to receive additional pain medication. However, you are responsible for: As soon as you are able, call our office [phone], at any time of the day or night, and leave a message stating your name, the date and nature of the emergency, and the name and dose of the medication prescribed. In the event that your call is answered by a member of our staff, make sure to document and save the date, time, and the name of the person that took your information.  2. Exception #2 (Planned Surgery): In the event that you are scheduled by another doctor or dentist to have any type of surgery or procedure, you are allowed (for a period no longer than 30 days), to receive additional pain medication, for the acute post-op pain. However, in this case, you are responsible for picking up a copy of our "Post-op Pain Management for Surgeons" handout, and giving it to your surgeon or dentist. This  document is available at our office, and does not require an appointment to obtain it. Simply go to our office during business hours (Monday-Thursday from 8:00 AM to 4:00 PM) (Friday 8:00 AM to 12:00 Noon) or if you have a scheduled appointment with us, prior to your surgery, and ask for it by name. In addition, you are responsible for: calling our office [phone], at any time of the day or night, and leaving a message stating your name, name of your surgeon, type of surgery, and date of procedure or surgery. Failure to comply with your responsibilities may result in termination of therapy involving the controlled substances.  *Opioid medications include: morphine, codeine, oxycodone, oxymorphone, hydrocodone, hydromorphone, meperidine, tramadol, tapentadol, buprenorphine, fentanyl, methadone. **Benzodiazepine medications include: diazepam (Valium), alprazolam (Xanax), clonazepam (Klonopine), lorazepam (Ativan), clorazepate (Tranxene), chlordiazepoxide (Librium), estazolam (Prosom), oxazepam (Serax), temazepam (Restoril), triazolam (Halcion) (Last updated: 02/06/2020) ____________________________________________________________________________________________   ____________________________________________________________________________________________  Medication Recommendations and Reminders  Applies to: All patients receiving prescriptions (written and/or electronic).  Medication Rules & Regulations: These rules and regulations exist for your safety and that of others. They are not flexible and neither are we. Dismissing or ignoring them will be considered "non-compliance" with medication therapy, resulting in complete and irreversible termination of such therapy. (See document titled "Medication Rules" for more details.) In all conscience, because of safety reasons, we cannot continue providing a therapy where the patient does not follow instructions.  Pharmacy of record:   Definition:  This is the pharmacy where your electronic prescriptions will be sent.   We do not endorse any particular pharmacy, however, we have experienced problems with [facility] not securing enough medication supply for the community.  We do not restrict you in your choice of pharmacy. However,   once we write for your prescriptions, we will NOT be re-sending more prescriptions to fix restricted supply problems created by your pharmacy, or your insurance.   The pharmacy listed in the electronic medical record should be the one where you want electronic prescriptions to be sent.  If you choose to change pharmacy, simply notify our nursing staff.  Recommendations:  Keep all of your pain medications in a safe place, under lock and key, even if you live alone. We will NOT replace lost, stolen, or damaged medication.  After you fill your prescription, take 1 week's worth of pills and put them away in a safe place. You should keep a separate, properly labeled bottle for this purpose. The remainder should be kept in the original bottle. Use this as your primary supply, until it runs out. Once it's gone, then you know that you have 1 week's worth of medicine, and it is time to come in for a prescription refill. If you do this correctly, it is unlikely that you will ever run out of medicine.  To make sure that the above recommendation works, it is very important that you make sure your medication refill appointments are scheduled at least 1 week before you run out of medicine. To do this in an effective manner, make sure that you do not leave the office without scheduling your next medication management appointment. Always ask the nursing staff to show you in your prescription , when your medication will be running out. Then arrange for the receptionist to get you a return appointment, at least 7 days before you run out of medicine. Do not wait until you have 1 or 2 pills left, to come in. This is very poor planning and  does not take into consideration that we may need to cancel appointments due to bad weather, sickness, or emergencies affecting our staff.  DO NOT ACCEPT A "Partial Fill": If for any reason your pharmacy does not have enough pills/tablets to completely fill or refill your prescription, do not allow for a "partial fill". The law allows the pharmacy to complete that prescription within 72 hours, without requiring a new prescription. If they do not fill the rest of your prescription within those 72 hours, you will need a separate prescription to fill the remaining amount, which we will NOT provide. If the reason for the partial fill is your insurance, you will need to talk to the pharmacist about payment alternatives for the remaining tablets, but again, DO NOT ACCEPT A PARTIAL FILL, unless you can trust your pharmacist to obtain the remainder of the pills within 72 hours.  Prescription refills and/or changes in medication(s):   Prescription refills, and/or changes in dose or medication, will be conducted only during scheduled medication management appointments. (Applies to both, written and electronic prescriptions.)  No refills on procedure days. No medication will be changed or started on procedure days. No changes, adjustments, and/or refills will be conducted on a procedure day. Doing so will interfere with the diagnostic portion of the procedure.  No phone refills. No medications will be "called into the pharmacy".  No Fax refills.  No weekend refills.  No Holliday refills.  No after hours refills.  Remember:  Business hours are:  Monday to Thursday 8:00 AM to 4:00 PM Provider's Schedule: Francisco Naveira, MD - Appointments are:  Medication management: Monday and Wednesday 8:00 AM to 4:00 PM Procedure day: Tuesday and Thursday 7:30 AM to 4:00 PM Bilal Lateef, MD - Appointments are:    Medication management: Tuesday and Thursday 8:00 AM to 4:00 PM Procedure day: Monday and Wednesday  7:30 AM to 4:00 PM (Last update: 09/28/2019) ____________________________________________________________________________________________   ____________________________________________________________________________________________  CBD (cannabidiol) WARNING  Applicable to: All individuals currently taking or considering taking CBD (cannabidiol) and, more important, all patients taking opioid analgesic controlled substances (pain medication). (Example: oxycodone; oxymorphone; hydrocodone; hydromorphone; morphine; methadone; tramadol; tapentadol; fentanyl; buprenorphine; butorphanol; dextromethorphan; meperidine; codeine; etc.)  Legal status: CBD remains a Schedule I drug prohibited for any use. CBD is illegal with one exception. In the [place], CBD has a limited Transport planner (FDA) approval for the treatment of two specific types of epilepsy disorders. Only one CBD product has been approved by the FDA for this purpose: "Epidiolex". FDA is aware that some companies are marketing products containing cannabis and cannabis-derived compounds in ways that violate the [employer], Drug and Cosmetic Act [facility] Act) and that may put the health and safety of consumers at risk. The FDA, a Federal agency, has not enforced the CBD status since 2018.   Legality: Some manufacturers ship CBD products nationally, which is illegal. Often such products are sold online and are therefore available throughout the country. CBD is openly sold in head shops and health food stores in some states where such sales have not been explicitly legalized. Selling unapproved products with unsubstantiated therapeutic claims is not only a violation of the law, but also can put patients at risk, as these products have not been proven to be safe or effective. Federal illegality makes it difficult to conduct research on CBD.  Reference: "FDA Regulation of Cannabis and Cannabis-Derived Products, Including Cannabidiol  (CBD)" - [employer]  Warning: CBD is not FDA approved and has not undergo the same manufacturing controls as prescription drugs.  This means that the purity and safety of available CBD may be questionable. Most of the time, despite manufacturer's claims, it is contaminated with THC (delta-9-tetrahydrocannabinol - the chemical in marijuana responsible for the "HIGH").  When this is the case, the [facility] contaminant will trigger a positive urine drug screen (UDS) test for Marijuana (carboxy-THC). Because a positive UDS for any illicit substance is a violation of our medication agreement, your opioid analgesics (pain medicine) may be permanently discontinued.  MORE ABOUT CBD  General Information: CBD  is a derivative of the Marijuana (cannabis sativa) plant discovered in 25. It is one of the 113 identified substances found in Marijuana. It accounts for up to 40% of the plant's extract. As of 2018, preliminary clinical studies on CBD included research for the treatment of anxiety, movement disorders, and pain. CBD is available and consumed in multiple forms, including inhalation of smoke or vapor, as an aerosol spray, and by mouth. It may be supplied as an oil containing CBD, capsules, dried cannabis, or as a liquid solution. CBD is thought not to be as psychoactive as THC (delta-9-tetrahydrocannabinol - the chemical in marijuana responsible for the "HIGH"). Studies suggest that CBD may interact with different biological target receptors in the body, including cannabinoid and other neurotransmitter receptors. As of 2018 the mechanism of action for its biological effects has not been determined.  Side-effects  Adverse reactions: Dry mouth, diarrhea, decreased appetite, fatigue, drowsiness, malaise, weakness, sleep disturbances, and others.  Drug interactions: CBC may interact with other  medications such as blood-thinners. (Last update: 10/15/2019) ____________________________________________________________________________________________   ____________________________________________________________________________________________  Medication Evaluation  Purpose: The purpose of these questions is to establish the onset of effects (speed of absorption), peak benefit (  effectiveness), side-effects (ability to tolerate), and duration (excretion/metabolism) of the prescribed medication. The results will help the healthcare provider decide what to do with the medication in question.  Please indicate:  1. Time to onset of benefits: The amount of time it takes for you to begin perceiving any benefits after taking (swallowing) your pain medicine. _______ minutes.  2. Time to peak effect: The time it takes between taking medicine (swallowing it) and the moment when you feel the most benefit (peak effect) from the medicine. _______ minutes.  3. Peak benefit: Please quantify the amount of relief you obtain from the medicine at the moment it is working the best. Does you pain completely go away (100% gone)? Is it three quarters (3/4) better (75% relief)? Does half of your pain go away (50% benefit)? Is it a third better (33% improved)? Or does it go down only by a fourth (25% better)? _______ % relief.  4. Duration of benefit: The time it takes for all benefits of the medicine to be gone. This period of time starts when you first swallow your pain pill and ends when you feel that your pain has increased to the point where you absolutely need to take another pill. _______ hours and _______ minutes.  5. Adverse reactions: These are side effects and/or adverse reactions you experience since taking the medicine or only when taking your pain medicine. Please circle any and all that apply:  a. Allergic reactions: itching; hives; generalized redness; swelling of the tongue; swelling of the eyes;  difficulty breathing. b. Neurological Intolerance: cognitive impairment (difficulty thinking clearly; memory problems; difficulty remembering things; slurred speech); oversedation; sleepiness: unsteadiness; difficulty walking. c. Gastrointestinal problems: constipation; nausea; vomiting; dry heaves; decreased appetite. d. Hormonal problems: decreased sex drive; erection problems; abnormal menstrual period; weight gain or inability to lose weight; weakness or low energy; hair loss.  What to do: When completed, please bring back to your next visit. Please give it to the admitting nurse.  (Last updated: 07/22/2018) ____________________________________________________________________________________________

## 2020-04-11 NOTE — Progress Notes (Signed)
PROVIDER NOTE: Information contained herein reflects review and annotations entered in association with encounter. Interpretation of such information and data should be left to medically-trained personnel. Information provided to patient can be located elsewhere in the medical record under "Patient Instructions". Document created using STT-dictation technology, any transcriptional errors that may result from process are unintentional.    Patient: Logan Brown.  Service Category: E/M  Provider: Gaspar Cola, MD  DOB: 03/14/1931  DOS: 04/11/2020  Specialty: Interventional Pain Management  MRN: [id number]  Setting: Ambulatory outpatient  PCP: [name], MD  Type: Established Patient    Referring Provider: [name], MD  Location: Office  Delivery: Face-to-face     HPI  Mr. Logan Plata., a 87 y.o. year old male, is here today because of his Chronic pain syndrome [G89.4]. [name] primary complain today is Back Pain (low) Last encounter: My last encounter with him was on 01/16/2020. Pertinent problems: 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 ([place]); History of prostate cancer; Osteopenia of spine; Osteopenia determined by x-ray; Osteoarthritis of facet joint of lumbar spine; Osteoarthritis involving multiple joints; DDD (degenerative disc disease), lumbar; Osteoarthritis of hip (Bilateral); Abnormal MRI, lumbar spine; Lumbar central spinal stenosis, w/o neurogenic claudication; 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 Pain Assessment: Severity of Chronic pain is reported as a 8 /10. Location: Back Lower/ . Onset: More than a month ago. Quality: Aching,Constant. Timing: Constant. Modifying factor(s): meds, rest. Vitals:  height is $RemoveB'5\' 6"'XPlwBaPV$  (1.676 m) and weight is 175 lb (79.4 kg). His temporal temperature is 97.4 F (36.3 C) (abnormal). His blood pressure is 172/92 (abnormal) and his pulse is 58 (abnormal). His respiration is 18 and oxygen saturation is 100%.   Reason for encounter: medication management.  Today the patient was short on his pill count.  He was supposed to have enough medication until 04/15/2020 and today, 04/11/2020 he is already out of medicine.  He did not call lost to ask if it was okay to increase his use of the medicine.  Today the patient came back short on his pill count.  As it turned out, he started taking more medication without our approval.  This triggered a very long conversation about his medication use and compliance with our practice regulations regarding the controlled substances.  Apparently he has been having more pain in the area of the upper back and right shoulder as well as his entire spine secondary to his metastases to the spine.  Today I went back to see if I could get some idea where his metastases are located based on his x-rays, but I was not able to find any recent ones available in the system.  Because of this, today I will be ordering some x-rays of the cervical, thoracic, and lumbar spine.  To help the patient with his increase in the pain I will go ahead and give him a steroid taper.  Unfortunately for him, because he took more medicine than prescribed and did not consult's before doing so, I will not be rewarding his behavior by simply given him an earlier prescription.  He will still have to wait until he is due for his next refill.  He had been made aware that the medication had to last for 30 days.  However, on his next prescription I will increase  the amount of medicine that he takes allowing him to take it every 6 hours, if needed.  However to get a better idea what his requirements are, I have provided him with a questionnaire in the patient information section of the chart and I have explained to him how to complete it so that I can get some more information in order to make some irrigated changes to the therapy, if needed.  RTCB: 05/15/2020  Pharmacotherapy Assessment   Analgesic: Oxycodone IR  5 mg, 1 tab PO BID (15 MME/day) MME/day: 15 mg/day.   Monitoring: [place] PMP: PDMP reviewed during this encounter.       Pharmacotherapy: No side-effects or adverse reactions reported. Compliance: No problems identified. Effectiveness: Clinically acceptable.  Hart Rochester, RN  04/11/2020  1:29 PM  Sign when Signing Visit Nursing Pain Medication Assessment:  Safety precautions to be maintained throughout the outpatient stay will include: orient to surroundings, keep bed in low position, maintain call bell within reach at all times, provide assistance with transfer out of bed and ambulation.  Medication Inspection Compliance: Pill count conducted under aseptic conditions, in front of the patient. Neither the pills nor the bottle was removed from the patient's sight at any time. Once count was completed pills were immediately returned to the patient in their original bottle.  Medication: Oxycodone IR Pill/Patch Count: 0 of 60 pills remain Pill/Patch Appearance: Markings consistent with prescribed medication Bottle Appearance: Standard pharmacy container. Clearly labeled. Filled Date: 01 / 09 / 2022 Last Medication intake:  Today    UDS:  Summary  Date Value Ref Range Status  03/23/2018 FINAL  Final    Comment:    ==================================================================== TOXASSURE COMP DRUG ANALYSIS,UR ==================================================================== Test                             Result       Flag        Units Drug Present and Declared for Prescription Verification   Fentanyl                       9            EXPECTED   ng/mg creat   Norfentanyl                    40           EXPECTED   ng/mg creat    Source of fentanyl is a scheduled prescription medication,    including IV, patch, and transmucosal formulations. Norfentanyl    is an expected metabolite of fentanyl.   Gabapentin                     PRESENT      EXPECTED Drug Present not Declared for Prescription Verification   Acetaminophen                  PRESENT      UNEXPECTED Drug Absent but Declared for Prescription Verification   Salicylate                     Not Detected UNEXPECTED    Aspirin, as indicated in the  declared medication list, is not    always detected even when used as directed.   Hydroxyzine                    Not Detected UNEXPECTED ==================================================================== Test                      Result    Flag   Units      Ref Range   Creatinine              235              mg/dL      >=20 ==================================================================== Declared Medications:  The flagging and interpretation on this report are based on the  following declared medications.  Unexpected results may arise from  inaccuracies in the declared medications.  **Note: The testing scope of this panel includes these medications:  Fentanyl (Fentanyl Patch)  Gabapentin  Hydroxyzine  **Note: The testing scope of this panel does not include small to  moderate amounts of these reported medications:  Aspirin (Aspirin 81)  **Note: The testing scope of this panel does not include following  reported medications:  Labetalol  Lisinopril  Multivitamin (MVI)  Omeprazole  Vitamin D3 ==================================================================== For clinical consultation, please call [phone]. ====================================================================      ROS   Constitutional: Denies any fever or chills Gastrointestinal: No reported hemesis, hematochezia, vomiting, or acute GI distress Musculoskeletal: Denies any acute onset joint swelling, redness, loss of ROM, or weakness Neurological: No reported episodes of acute onset apraxia, aphasia, dysarthria, agnosia, amnesia, paralysis, loss of coordination, or loss of consciousness  Medication Review  Calcium Carbonate Antacid, Cyanocobalamin, Vitamin D3, aspirin EC, enzalutamide, furosemide, gabapentin, labetalol, losartan, omeprazole, oxyCODONE, and predniSONE  History Review  Allergy: [name] is allergic to ace inhibitors and hydrocodone. Drug: [name]  reports no history of drug use. Alcohol:  reports no history of alcohol use. Tobacco:  reports that he quit smoking about 45 years ago. He has never used smokeless tobacco. Social: [name]  reports that he quit smoking about 45 years ago. He has never used smokeless tobacco. He reports that he does not drink alcohol and does not use drugs. Medical:  has a past medical history of Arthritis, Atrial fibrillation ([place]), Bone cancer ([place]), Cardiomyopathy ([place]), Chronic kidney disease, Hyperlipidemia, Hypertension, Prostate cancer ([place]) (1997), and Unstable gait (04/16/2018). Surgical: Mr. Westrup  has a past surgical history that includes Eye surgery (Bilateral); Cataract extraction; Prostate surgery; and Wrist foreign body removal. Family: family history is not on file.  Laboratory Chemistry Profile   Renal Lab Results  Component Value Date   BUN 27 (H) 02/08/2020   CREATININE 1.72 (H) 02/08/2020   BCR 16 02/08/2020   GFRAA 41 (L) 02/08/2020   GFRNONAA 35 (L) 02/08/2020     Hepatic Lab Results  Component Value Date   AST 18 02/08/2020   ALT 13 02/08/2020   ALBUMIN 4.0 12/20/2019   ALKPHOS 48 12/20/2019     Electrolytes Lab Results  Component Value Date   NA 140 02/08/2020   K 5.0 02/08/2020   CL 107 02/08/2020   CALCIUM 9.5  02/08/2020   MG 2.2 04/27/2019   PHOS 4.6 01/10/2019     Bone Lab Results  Component Value Date   VD25OH 25 (L) 04/27/2019   25OHVITD1 41 03/23/2018   25OHVITD2 <1.0 03/23/2018   25OHVITD3 41 03/23/2018   TESTOSTERONE <3 (L) 11/17/2018  Inflammation (CRP: Acute Phase) (ESR: Chronic Phase) Lab Results  Component Value Date   CRP 6 03/23/2018   ESRSEDRATE 30 03/23/2018   LATICACIDVEN 1.7 05/23/2019       Note: Above Lab results reviewed.  Recent Imaging Review  ECHOCARDIOGRAM COMPLETE    ECHOCARDIOGRAM REPORT       Patient Name:   Logan Brown. Date of Exam: 12/29/2019 Medical Rec #:  [id number]              Height:       67.0 in Accession #:    [id number]             Weight:       177.5 lb Date of Birth:  [birth year]               BSA:          1.922 m Patient Age:    88 years               BP:           120/64 mmHg Patient Gender: M                      HR:           60 bpm. Exam Location:  [address]  Procedure: 2D Echo, Cardiac Doppler and Color Doppler  Indications:    I50.9* Heart failure (unspecified); R06.00 Dyspnea   History:        Patient has prior history of Echocardiogram examinations, most                 recent 12/07/2015. Arrythmias:Atrial Fibrillation,                 Signs/Symptoms:Shortness of Breath; Risk Factors:Hypertension                 and Dyslipidemia. Bradycardia. Orthostatic hypotension.                 Prediabetes. NICM. Chronic pain syndrome.   Sonographer:    Cathie Beams RCS Referring Phys: [phone] Bettey Mare [place]  IMPRESSIONS   1. Left ventricular ejection fraction, by estimation, is 55 to 60%. The left ventricle has normal function. The left ventricle demonstrates regional wall motion abnormalities with basal inferior hypokinesis. There is mild left ventricular hypertrophy.  Left ventricular diastolic parameters are consistent with Grade I diastolic dysfunction (impaired relaxation).  2. Right ventricular systolic  function is normal. The right ventricular size is normal. Tricuspid regurgitation signal is inadequate for assessing PA pressure.  3. The mitral valve is normal in structure. Trivial mitral valve regurgitation.  4. The aortic valve is tricuspid. Aortic valve regurgitation is trivial. No aortic stenosis is present.  5. Aortic dilatation noted. There is mild dilatation of the aortic root, measuring 39 mm.  6. The inferior vena cava is normal in size with greater than 50% respiratory variability, suggesting right atrial pressure of 3 mmHg.  FINDINGS  Left Ventricle: Left ventricular ejection fraction, by estimation, is 55 to 60%. The left ventricle has normal function. The left ventricle demonstrates regional wall motion abnormalities. The left ventricular internal cavity size was normal in size.  There is mild left ventricular hypertrophy. Left ventricular diastolic parameters are consistent with Grade I diastolic dysfunction (impaired relaxation).  Right Ventricle: The right ventricular size is normal. No increase in right ventricular wall thickness. Right ventricular systolic function is normal. Tricuspid regurgitation signal is inadequate for assessing  PA pressure.  Left Atrium: Left atrial size was normal in size.  Right Atrium: Right atrial size was normal in size.  Pericardium: There is no evidence of pericardial effusion.  Mitral Valve: The mitral valve is normal in structure. Trivial mitral valve regurgitation.  Tricuspid Valve: The tricuspid valve is normal in structure. Tricuspid valve regurgitation is not demonstrated.  Aortic Valve: The aortic valve is tricuspid. Aortic valve regurgitation is trivial. No aortic stenosis is present.  Pulmonic Valve: The pulmonic valve was normal in structure. Pulmonic valve regurgitation is trivial.  Aorta: Aortic dilatation noted. There is mild dilatation of the aortic root, measuring 39 mm.  Venous: The inferior vena cava is normal in size with  greater than 50% respiratory variability, suggesting right atrial pressure of 3 mmHg.  IAS/Shunts: No atrial level shunt detected by color flow Doppler.    LEFT VENTRICLE PLAX 2D LVIDd:         4.10 cm  Diastology LVIDs:         2.80 cm  LV e' medial:    3.64 cm/s LV PW:         1.10 cm  LV E/e' medial:  12.1 LV IVS:        1.30 cm  LV e' lateral:   4.73 cm/s LVOT diam:     2.47 cm  LV E/e' lateral: 9.3 LV SV:         91 LV SV Index:   47 LVOT Area:     4.78 cm    RIGHT VENTRICLE RV Basal diam:  2.60 cm RV S prime:     10.20 cm/s TAPSE (M-mode): 1.9 cm  LEFT ATRIUM             Index       RIGHT ATRIUM           Index LA diam:        4.30 cm 2.24 cm/m  RA Area:     16.90 cm LA Vol (A2C):   49.8 ml 25.91 ml/m RA Volume:   39.50 ml  20.55 ml/m LA Vol (A4C):   40.9 ml 21.28 ml/m LA Biplane Vol: 49.3 ml 25.65 ml/m  AORTIC VALVE LVOT Vmax:   71.00 cm/s LVOT Vmean:  46.200 cm/s LVOT VTI:    0.190 m   AORTA Ao Root diam: 3.90 cm  MITRAL VALVE MV Area (PHT): 2.95 cm    SHUNTS MV Decel Time: 257 msec    Systemic VTI:  0.19 m MV E velocity: 44.20 cm/s  Systemic Diam: 2.47 cm MV A velocity: 85.67 cm/s MV E/A ratio:  0.52  Marca Ancona MD Electronically signed by Marca Ancona MD Signature Date/Time: 12/29/2019/6:01:59 PM      Final   Note: Reviewed        Physical Exam  General appearance: Well nourished, well developed, and well hydrated. In no apparent acute distress Mental status: Alert, oriented x 3 (person, place, & time)       Respiratory: No evidence of acute respiratory distress Eyes: PERLA Vitals: BP (!) 172/92   Pulse (!) 58   Temp (!) 97.4 F (36.3 C) (Temporal)   Resp 18   Ht 5\' 6"  (1.676 m)   Wt 175 lb (79.4 kg)   SpO2 100%   BMI 28.25 kg/m  BMI: Estimated body mass index is 28.25 kg/m as calculated from the following:   Height as of this encounter: 5\' 6"  (1.676 m).   Weight as of this encounter: 175  lb (79.4 kg). Ideal: Ideal body  weight: 63.8 kg (140 lb 10.5 oz) Adjusted ideal body weight: 70 kg (154 lb 6.3 oz)  Assessment   Status Diagnosis  Controlled Controlled Controlled 1. Chronic pain syndrome   2. Cancer-related pain   3. Chronic low back pain (1ry area of Pain) (Bilateral) (R>L) w/o sciatica   4. Compression fracture of L2 lumbar vertebra, sequela   5. 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 Lumbar central spinal stenosis, w/o neurogenic claudication   7. Prostate cancer ([place])   8. Spine metastasis ([place])   9. Pharmacologic therapy   10. Uncomplicated opioid dependence ([place])      Updated Problems: Problem  Chronic low back pain (1ry area of Pain) (Bilateral) (R>L) w/o sciatica    Plan of Care  Problem-specific:  No problem-specific Assessment & Plan notes found for this encounter.  [name]. has a current medication list which includes the following long-term medication(s): gabapentin, labetalol, losartan, omeprazole, and [START ON 04/15/2020] oxycodone.  Pharmacotherapy (Medications Ordered): Meds ordered this encounter  Medications  . oxyCODONE (OXY IR/ROXICODONE) 5 MG immediate release tablet    Sig: Take 1 tablet (5 mg total) by mouth every 6 (six) hours as needed for severe pain. Must last 30 days.    Dispense:  120 tablet    Refill:  0    Chronic Pain: STOP Act (Not applicable) Fill 1 day early if closed on refill date. Avoid benzodiazepines within 8 hours of opioids  . predniSONE (DELTASONE) 20 MG tablet    Sig: Take 3 tablets (60 mg total) by mouth daily with breakfast for 3 days, THEN 2 tablets (40 mg total) daily with breakfast for 3 days, THEN 1 tablet (20 mg total) daily with breakfast for 3 days.    Dispense:  18 tablet    Refill:  0   Orders:  Orders Placed This Encounter  Procedures  . DG Cervical Spine With Flex & Extend    Patient presents with axial pain with possible radicular component.  Please evaluate  for any evidence of cervical spine instability. Describe the presence of any spondylolisthesis (Antero- or retrolisthesis). If present, provide displacement "Grade" and measurement in cm. Please describe presence and specific location (Level & Laterality) of any signs of  osteoarthritis, zygapophyseal (Facet) joints DJD (including decreased joint space and/or osteophytosis), DDD, Foraminal narrowing, as well as any sclerosis and/or cyst formation. Please comment on ROM. In addition to any acute findings, please report on:  1. Facet (Zygapophyseal) joint DJD (Hypertrophy, space narrowing, subchondral sclerosis, and/or osteophyte formation) 2. DDD and/or IVDD (Loss of disc height, desiccation or "Black disc disease") 3. Pars defects 4. Spondylolisthesis, spondylosis, and/or spondyloarthropathies (include Degree/Grade of displacement in mm) 5. Vertebral body Fractures, including age (old, new/acute) 70. Modic Type Changes 7. Demineralization 8. Bone pathology 9. Central, Lateral Recess, and/or Foraminal Stenosis (include AP diameter of stenosis in mm) 10. Surgical changes (hardware type, status, and presence of fibrosis) NOTE: Please specify level(s) and laterality. If applicable: Please indicate ROM and/or evidence of instability (>30mm displacement between flexion and extension views)    Standing Status:   Future    Standing Expiration Date:   05/09/2020    Scheduling Instructions:     Imaging must be done as soon as possible. Inform patient that order will expire within 30 days and I will not renew it.    Order Specific Question:   Reason for Exam (  SYMPTOM  OR DIAGNOSIS REQUIRED)    Answer:   Cervicalgia    Order Specific Question:   Preferred imaging location?    Answer:   [facility]    Order Specific Question:   Call Results- Best Contact Number?    Answer:   [phone] [facility])    Order Specific Question:   Radiology Contrast Protocol - do NOT remove file path    Answer:    \\[email or web address]    Order Specific Question:   Release to patient    Answer:   Immediate  . DG Thoracic Spine 2 View    Patient presents with axial pain with possible radicular component.  In addition to any acute findings, please report on:  1. Facet (Zygapophyseal) joint DJD (Hypertrophy, space narrowing, subchondral sclerosis, and/or osteophyte formation) 2. DDD and/or IVDD (Loss of disc height, desiccation or "Black disc disease") 3. Pars defects 4. Spondylolisthesis, spondylosis, and/or spondyloarthropathies (include Degree/Grade of displacement in mm) 5. Vertebral body Fractures, including age (old, new/acute) 6. Modic Type Changes 7. Demineralization 8. Bone pathology 9. Central, Lateral Recess, and/or Foraminal Stenosis (include AP diameter of stenosis in mm) 10. Surgical changes (hardware type, status, and presence of fibrosis) NOTE: Please specify level(s) and laterality.    Standing Status:   Future    Standing Expiration Date:   05/09/2020    Scheduling Instructions:     Imaging must be done as soon as possible. Inform patient that order will expire within 30 days and I will not renew it.    Order Specific Question:   Reason for Exam (SYMPTOM  OR DIAGNOSIS REQUIRED)    Answer:   Upper back pain and/or thoracic spine pain.    Order Specific Question:   Preferred imaging location?    Answer:   [facility]    Order Specific Question:   Call Results- Best Contact Number?    Answer:   [phone] [facility])  . DG Lumbar Spine Complete W/Bend    Patient presents with axial pain with possible radicular component.  In addition to any acute findings, please report on:  1. Facet (Zygapophyseal) joint DJD (Hypertrophy, space narrowing, subchondral sclerosis, and/or osteophyte formation) 2. DDD and/or IVDD (Loss of disc height, desiccation or "Black disc disease") 3. Pars defects 4. Spondylolisthesis, spondylosis, and/or  spondyloarthropathies (include Degree/Grade of displacement in mm) 5. Vertebral body Fractures, including age (old, new/acute) 6. Modic Type Changes 7. Demineralization 8. Bone pathology 9. Central, Lateral Recess, and/or Foraminal Stenosis (include AP diameter of stenosis in mm) 10. Surgical changes (hardware type, status, and presence of fibrosis)  NOTE: Please specify level(s) and laterality. If applicable: Please indicate ROM and/or evidence of instability (>47mm displacement between flexion and extension views)    Standing Status:   Future    Standing Expiration Date:   05/09/2020    Scheduling Instructions:     Imaging must be done as soon as possible. Inform patient that order will expire within 30 days and I will not renew it.    Order Specific Question:   Reason for Exam (SYMPTOM  OR DIAGNOSIS REQUIRED)    Answer:   Low back pain    Order Specific Question:   Preferred imaging location?    Answer:    [facility]    Order Specific Question:   Call Results- Best Contact Number?    Answer:   [phone] [facility])    Order Specific Question:   Radiology Contrast Protocol - do NOT remove  file path    Answer:   \\[email or web address]    Order Specific Question:   Release to patient    Answer:   Immediate  . ToxASSURE Select 13 (MW), Urine    Volume: 30 ml(s). Minimum 3 ml of urine is needed. Document temperature of fresh sample. Indications: Long term (current) use of opiate analgesic (P94.327)    Order Specific Question:   Release to patient    Answer:   Immediate   Follow-up plan:   Return in about 5 weeks (around 05/15/2020) for (F2F), (Med Mgmt).      Interventional management options: Planned, scheduled, and/or pending:   Therapeutic/palliative bilateral L2 transforaminal ESI #3 under fluoroscopic guidance and IV sedation   Considering:   Diagnostic IV lidocaine infusion  Diagnostic bilateral lumbar facet NB Possible  bilateral lumbar facetRFA   Palliative PRN treatment(s):   Palliative bilateral L2 TFESI #3    Recent Visits Date Type Provider Dept  01/16/20 Office Visit [name], MD [facility]  Showing recent visits within past 90 days and meeting all other requirements Today's Visits Date Type Provider Dept  04/11/20 Office Visit [name], MD [facility]  Showing today's visits and meeting all other requirements Future Appointments No visits were found meeting these conditions. Showing future appointments within next 90 days and meeting all other requirements  I discussed the assessment and treatment plan with the patient. The patient was provided an opportunity to ask questions and all were answered. The patient agreed with the plan and demonstrated an understanding of the instructions.  Patient advised to call back or seek an in-person evaluation if the symptoms or condition worsens.  Duration of encounter: 57 minutes.  Note by: Gaspar Cola, MD Date: 04/11/2020; Time: 2:20 PM

## 2020-04-11 NOTE — Progress Notes (Signed)
Nursing Pain Medication Assessment:  Safety precautions to be maintained throughout the outpatient stay will include: orient to surroundings, keep bed in low position, maintain call bell within reach at all times, provide assistance with transfer out of bed and ambulation.  Medication Inspection Compliance: Pill count conducted under aseptic conditions, in front of the patient. Neither the pills nor the bottle was removed from the patient's sight at any time. Once count was completed pills were immediately returned to the patient in their original bottle.  Medication: Oxycodone IR Pill/Patch Count: 0 of 60 pills remain Pill/Patch Appearance: Markings consistent with prescribed medication Bottle Appearance: Standard pharmacy container. Clearly labeled. Filled Date: 01 / 09 / 2022 Last Medication intake:  Today

## 2020-04-16 ENCOUNTER — Other Ambulatory Visit: Payer: Self-pay | Admitting: Internal Medicine

## 2020-04-16 ENCOUNTER — Telehealth: Payer: Self-pay

## 2020-04-16 DIAGNOSIS — K219 Gastro-esophageal reflux disease without esophagitis: Secondary | ICD-10-CM

## 2020-04-16 NOTE — Telephone Encounter (Signed)
The pharmacy did not get the oxycodone script. From 04/11/20 Please look into this, he is highly upset.

## 2020-04-16 NOTE — Telephone Encounter (Signed)
I called the pharmacy to verify. Send a bubble message to Dr. Dossie Arbour.

## 2020-04-17 ENCOUNTER — Inpatient Hospital Stay: Payer: Medicare HMO

## 2020-04-17 ENCOUNTER — Inpatient Hospital Stay: Payer: Medicare HMO | Admitting: Oncology

## 2020-04-17 ENCOUNTER — Telehealth: Payer: Self-pay | Admitting: *Deleted

## 2020-04-17 ENCOUNTER — Telehealth: Payer: Self-pay | Admitting: Oncology

## 2020-04-17 ENCOUNTER — Other Ambulatory Visit: Payer: Self-pay | Admitting: Pain Medicine

## 2020-04-17 DIAGNOSIS — C7951 Secondary malignant neoplasm of bone: Secondary | ICD-10-CM

## 2020-04-17 DIAGNOSIS — G894 Chronic pain syndrome: Secondary | ICD-10-CM

## 2020-04-17 DIAGNOSIS — G893 Neoplasm related pain (acute) (chronic): Secondary | ICD-10-CM

## 2020-04-17 DIAGNOSIS — C61 Malignant neoplasm of prostate: Secondary | ICD-10-CM

## 2020-04-17 LAB — TOXASSURE SELECT 13 (MW), URINE

## 2020-04-17 MED ORDER — OXYCODONE HCL 5 MG PO TABS: 5.0000 mg | ORAL_TABLET | Freq: Four times a day (QID) | ORAL | 0 refills | Status: DC | PRN

## 2020-04-17 NOTE — Telephone Encounter (Signed)
Attempted to call patient to notify him that script for Oxycodone has been sent to pharmacy. Mailbox full, unable to leave a message.

## 2020-04-17 NOTE — Telephone Encounter (Signed)
R/s appt per 2/8 sch msg - pt is aware of appt date and time

## 2020-04-17 NOTE — Telephone Encounter (Signed)
Attempted to call patient to notify him that prescription for Oxycodone has been sent to pharmacy. Voicemail full.

## 2020-04-17 NOTE — Progress Notes (Signed)
The patient's last prescription had to be resent to pharmacy due to the fact that despite the report on the prescription indicating that it was received by the pharmacy, they indicated that they did not.  Prescription had to be resent.

## 2020-04-18 ENCOUNTER — Telehealth: Payer: Self-pay

## 2020-04-19 ENCOUNTER — Inpatient Hospital Stay: Payer: Medicare HMO

## 2020-04-19 ENCOUNTER — Other Ambulatory Visit: Payer: Self-pay

## 2020-04-19 ENCOUNTER — Inpatient Hospital Stay: Payer: Medicare HMO | Attending: Oncology

## 2020-04-19 ENCOUNTER — Inpatient Hospital Stay (HOSPITAL_BASED_OUTPATIENT_CLINIC_OR_DEPARTMENT_OTHER): Payer: Medicare HMO | Admitting: Oncology

## 2020-04-19 VITALS — BP 121/70 | HR 103 | Temp 97.8°F | Resp 18 | Ht 66.0 in | Wt 174.6 lb

## 2020-04-19 DIAGNOSIS — Z192 Hormone resistant malignancy status: Secondary | ICD-10-CM | POA: Insufficient documentation

## 2020-04-19 DIAGNOSIS — C7951 Secondary malignant neoplasm of bone: Secondary | ICD-10-CM | POA: Insufficient documentation

## 2020-04-19 DIAGNOSIS — S32020S Wedge compression fracture of second lumbar vertebra, sequela: Secondary | ICD-10-CM

## 2020-04-19 DIAGNOSIS — Z923 Personal history of irradiation: Secondary | ICD-10-CM | POA: Insufficient documentation

## 2020-04-19 DIAGNOSIS — M549 Dorsalgia, unspecified: Secondary | ICD-10-CM | POA: Insufficient documentation

## 2020-04-19 DIAGNOSIS — R42 Dizziness and giddiness: Secondary | ICD-10-CM | POA: Diagnosis not present

## 2020-04-19 DIAGNOSIS — C61 Malignant neoplasm of prostate: Secondary | ICD-10-CM

## 2020-04-19 DIAGNOSIS — Z79899 Other long term (current) drug therapy: Secondary | ICD-10-CM | POA: Insufficient documentation

## 2020-04-19 DIAGNOSIS — Z79818 Long term (current) use of other agents affecting estrogen receptors and estrogen levels: Secondary | ICD-10-CM | POA: Diagnosis not present

## 2020-04-19 DIAGNOSIS — Z7982 Long term (current) use of aspirin: Secondary | ICD-10-CM | POA: Diagnosis not present

## 2020-04-19 LAB — CBC WITH DIFFERENTIAL (CANCER CENTER ONLY)
Abs Immature Granulocytes: 0.05 10*3/uL (ref 0.00–0.07)
Basophils Absolute: 0 10*3/uL (ref 0.0–0.1)
Basophils Relative: 0 %
Eosinophils Absolute: 0.1 10*3/uL (ref 0.0–0.5)
Eosinophils Relative: 2 %
HCT: 36 % — ABNORMAL LOW (ref 39.0–52.0)
Hemoglobin: 12.4 g/dL — ABNORMAL LOW (ref 13.0–17.0)
Immature Granulocytes: 1 %
Lymphocytes Relative: 29 %
Lymphs Abs: 1.5 10*3/uL (ref 0.7–4.0)
MCH: 34.9 pg — ABNORMAL HIGH (ref 26.0–34.0)
MCHC: 34.4 g/dL (ref 30.0–36.0)
MCV: 101.4 fL — ABNORMAL HIGH (ref 80.0–100.0)
Monocytes Absolute: 0.5 10*3/uL (ref 0.1–1.0)
Monocytes Relative: 10 %
Neutro Abs: 2.9 10*3/uL (ref 1.7–7.7)
Neutrophils Relative %: 58 %
Platelet Count: 158 10*3/uL (ref 150–400)
RBC: 3.55 MIL/uL — ABNORMAL LOW (ref 4.22–5.81)
RDW: 13.4 % (ref 11.5–15.5)
WBC Count: 5.1 10*3/uL (ref 4.0–10.5)
nRBC: 0 % (ref 0.0–0.2)

## 2020-04-19 LAB — CMP (CANCER CENTER ONLY)
ALT: 16 U/L (ref 0–44)
AST: 17 U/L (ref 15–41)
Albumin: 3.8 g/dL (ref 3.5–5.0)
Alkaline Phosphatase: 43 U/L (ref 38–126)
Anion gap: 9 (ref 5–15)
BUN: 29 mg/dL — ABNORMAL HIGH (ref 8–23)
CO2: 24 mmol/L (ref 22–32)
Calcium: 9.1 mg/dL (ref 8.9–10.3)
Chloride: 108 mmol/L (ref 98–111)
Creatinine: 1.66 mg/dL — ABNORMAL HIGH (ref 0.61–1.24)
GFR, Estimated: 40 mL/min — ABNORMAL LOW (ref >60)
Glucose, Bld: 95 mg/dL (ref 70–99)
Potassium: 4.3 mmol/L (ref 3.5–5.1)
Sodium: 141 mmol/L (ref 135–145)
Total Bilirubin: 0.6 mg/dL (ref 0.3–1.2)
Total Protein: 6.3 g/dL — ABNORMAL LOW (ref 6.5–8.1)

## 2020-04-19 MED ORDER — LEUPROLIDE ACETATE (4 MONTH) 30 MG ~~LOC~~ KIT
30.0000 mg | PACK | Freq: Once | SUBCUTANEOUS | Status: AC
  Administered 2020-04-19: 30 mg via SUBCUTANEOUS

## 2020-04-19 MED ORDER — DENOSUMAB 120 MG/1.7ML ~~LOC~~ SOLN
120.0000 mg | Freq: Once | SUBCUTANEOUS | Status: AC
  Administered 2020-04-19: 120 mg via SUBCUTANEOUS

## 2020-04-19 MED ORDER — DENOSUMAB 120 MG/1.7ML ~~LOC~~ SOLN
SUBCUTANEOUS | Status: AC
  Filled 2020-04-19: qty 1.7

## 2020-04-19 NOTE — Progress Notes (Signed)
Hematology and Oncology Follow Up Visit  Logan Brown [id number] [name] 23, 1932 87 y.o. 04/19/2020 9:02 AM Logan Brown, MDMcKeown, [name], MD   Principle Diagnosis: 87 year old man with castration-resistant advanced prostate cancer with disease to the bone diagnosed in 2019.  Initially presented with localized disease in 2000. [name] Kitchen      Prior Therapy: He is status post  radical prostatectomy followed by external beam radiation and remains disease free after diagnosis in 2000.  He presented with a fall and found to have PSA of 245 and multiple osseous lesions indicating metastatic disease to the bone in May 2019.  He was started on Lupron and Xgeva at that time.    Current therapy:   Eligard 30 mg every 4 months.  He is scheduled to have that repeated today.  Xgeva 120 mg every month started in January of 2020.  He is currently receiving it every 4 months.  Xtandi 80 mg daily started in April 2021.  Interim History: Logan Brown returns today for repeat evaluation.  Since the last visit, he reports no major changes in his health.  He has not tolerated Xtandi without any major complaints at this time.  He does report some occasional lightheadedness and dizziness which is chronic and unchanged at this time.  His performance status remains limited and ambulates with the help of a cane but no falls or syncope.  His appetite remained reasonable and his pain is manageable.  Denies any other complications related to [facility] or his cancer in general.         Medications: Updated on review. Current Outpatient Medications  Medication Sig Dispense Refill  . aspirin EC 81 MG tablet Take 81 mg by mouth daily.    . Calcium Carbonate Antacid (CALCIUM CARBONATE PO) Take 1,800 mg of elemental calcium by mouth 2 (two) times daily with a meal.     . Cholecalciferol (VITAMIN D3) 125 MCG (5000 UT) CAPS Take 5,000 Units by mouth daily.     . Cyanocobalamin (VITAMIN B-12 PO) Take 1,000 mg by  mouth in the morning and at bedtime.    . enzalutamide (XTANDI) 40 MG capsule Take 2 capsules (80 mg total) by mouth daily. 60 capsule 1  . furosemide (LASIX) 40 MG tablet Take 40 mg by mouth every Monday, Wednesday, and Friday.    . gabapentin (NEURONTIN) 600 MG tablet TAKE 1/2 TO 1 (ONE-HALF TO ONE) TABLET BY MOUTH 2 TO 3 TIMES DAILY FOR  CHRONIC  PAIN (Patient taking differently: 600 mg 2 (two) times daily. TAKE 1/2 TO 1 (ONE-HALF TO ONE) TABLET BY MOUTH 2 TO 3 TIMES DAILY FOR  CHRONIC  PAIN) 270 tablet 0  . labetalol (NORMODYNE) 100 MG tablet Take 0.5 tablets (50 mg total) by mouth 2 (two) times daily. 90 tablet 3  . losartan (COZAAR) 100 MG tablet Take 1 tablet Daily for BP (Patient taking differently: Take 100 mg by mouth daily.) 90 tablet 0  . omeprazole (PRILOSEC) 20 MG capsule Take  1 capsule  Daily  to Prevent Heartburn & Indigestion 90 capsule 0  . oxyCODONE (OXY IR/ROXICODONE) 5 MG immediate release tablet Take 1 tablet (5 mg total) by mouth every 6 (six) hours as needed for severe pain. Must last 30 days. 120 tablet 0  . predniSONE (DELTASONE) 20 MG tablet Take 3 tablets (60 mg total) by mouth daily with breakfast for 3 days, THEN 2 tablets (40 mg total) daily with breakfast for 3 days, THEN 1 tablet (20 mg total)  daily with breakfast for 3 days. 18 tablet 0   No current facility-administered medications for this visit.     Allergies:  Allergies  Allergen Reactions  . Ace Inhibitors Swelling and Other (See Comments)    Angioedema  . Hydrocodone Nausea And Vomiting       Physical Exam:   Blood pressure 121/70, pulse (!) 103, temperature 97.8 F (36.6 C), temperature source Tympanic, resp. rate 18, height 5\' 6"  (1.676 m), weight 174 lb 9.6 oz (79.2 kg), SpO2 97 %.     ECOG: 2   General appearance: Comfortable appearing without any discomfort Head: Normocephalic without any trauma Oropharynx: Mucous membranes are moist and pink without any thrush or ulcers. Eyes:  Pupils are equal and round reactive to light. Lymph nodes: No cervical, supraclavicular, inguinal or axillary lymphadenopathy.   Heart:regular rate and rhythm.  S1 and S2 without leg edema. Lung: Clear without any rhonchi or wheezes.  No dullness to percussion. Abdomin: Soft, nontender, nondistended with good bowel sounds.  No hepatosplenomegaly. Musculoskeletal: No joint deformity or effusion.  Full range of motion noted. Neurological: No deficits noted on motor, sensory and deep tendon reflex exam. Skin: No petechial rash or dryness.  Appeared moist.            Lab Results: Lab Results  Component Value Date   WBC 4.8 02/08/2020   HGB 12.3 (L) 02/08/2020   HCT 35.8 (L) 02/08/2020   MCV 101.1 (H) 02/08/2020   PLT 142 02/08/2020     Chemistry      Component Value Date/Time   NA 140 02/08/2020 1710   NA 143 03/23/2018 1109   K 5.0 02/08/2020 1710   CL 107 02/08/2020 1710   CO2 28 02/08/2020 1710   BUN 27 (H) 02/08/2020 1710   BUN 24 03/23/2018 1109   CREATININE 1.72 (H) 02/08/2020 1710      Component Value Date/Time   CALCIUM 9.5 02/08/2020 1710   ALKPHOS 48 12/20/2019 1121   AST 18 02/08/2020 1710   AST 22 12/20/2019 1121   ALT 13 02/08/2020 1710   ALT 18 12/20/2019 1121   BILITOT 0.5 02/08/2020 1710   BILITOT 0.8 12/20/2019 1121        Results for Logan Brown (MRN [id number]) as of 04/19/2020 09:04  Ref. Range 12/20/2019 11:21  Prostate Specific Ag, Serum Latest Ref Range: 0.0 - 4.0 ng/mL <0.1      Impression and Plan:   87 year old with the following:   1.    Advanced prostate cancer with disease to the bone diagnosed in 2019.  He has castration-resistant disease at this time.  His disease status was updated today and treatment options were reiterated.  He is currently on Xtandi which he has tolerated very well without any complications.  Risks and benefits of continuing this treatment long-term were reviewed.  Complications occluding  hypertension, weight gain, rarely seizure activity, excessive fatigue were reviewed.  Alternative treatment options including systemic chemotherapy will be deferred at this time.  His PSA continues show excellent response is agreeable to continue.  2.  Androgen deprivation: He will receive Eligard today and repeated in 4 months.  Complications including weight gain, osteoporosis among others were reviewed.  He is agreeable to proceed.  3.  Bone directed therapy: He is currently on Xgeva and the schedule changed every 4 months for convenience.  Complications including hypocalcemia and osteonecrosis of the jaw were reiterated.   4.  Prognosis and goals of care: His disease  is incurable and aggressive measures are warranted at this time.  His performance status remains adequate currently.  5.  Back pain: Related to arthritis as well as metastatic disease to the bone.  He is currently on oxycodone managed by pain clinic.  6.  Follow-up:  He will return in 4 months for repeat evaluation.  30  minutes were spent on this visit.  The time was dedicated to reviewing disease status, discussing treatment options and future plan of care review.     Zola Button, MD 2/10/20229:02 AM

## 2020-04-19 NOTE — Patient Instructions (Signed)
Leuprolide depot injection What is this medicine? LEUPROLIDE (loo PROE lide) is a man-made protein that acts like a natural hormone in the body. It decreases testosterone in men and decreases estrogen in women. In men, this medicine is used to treat advanced prostate cancer. In women, some forms of this medicine may be used to treat endometriosis, uterine fibroids, or other male hormone-related problems. This medicine may be used for other purposes; ask your health care provider or pharmacist if you have questions. COMMON BRAND NAME(S): Eligard, Fensolv, Lupron Depot, Lupron Depot-Ped, Viadur What should I tell my health care provider before I take this medicine? They need to know if you have any of these conditions:  diabetes  heart disease or previous heart attack  high blood pressure  high cholesterol  mental illness  osteoporosis  pain or difficulty passing urine  seizures  spinal cord metastasis  stroke  suicidal thoughts, plans, or attempt; a previous suicide attempt by you or a family member  tobacco smoker  unusual vaginal bleeding (women)  an unusual or allergic reaction to leuprolide, benzyl alcohol, other medicines, foods, dyes, or preservatives  pregnant or trying to get pregnant  breast-feeding How should I use this medicine? This medicine is for injection into a muscle or for injection under the skin. It is given by a health care professional in a hospital or clinic setting. The specific product will determine how it will be given to you. Make sure you understand which product you receive and how often you will receive it. Talk to your pediatrician regarding the use of this medicine in children. Special care may be needed. Overdosage: If you think you have taken too much of this medicine contact a poison control center or emergency room at once. NOTE: This medicine is only for you. Do not share this medicine with others. What if I miss a dose? It is  important not to miss a dose. Call your doctor or health care professional if you are unable to keep an appointment. Depot injections: Depot injections are given either once-monthly, every 12 weeks, every 16 weeks, or every 24 weeks depending on the product you are prescribed. The product you are prescribed will be based on if you are male or male, and your condition. Make sure you understand your product and dosing. What may interact with this medicine? Do not take this medicine with any of the following medications:  chasteberry  cisapride  dronedarone  pimozide  thioridazine This medicine may also interact with the following medications:  herbal or dietary supplements, like black cohosh or DHEA  male hormones, like estrogens or progestins and birth control pills, patches, rings, or injections  male hormones, like testosterone  other medicines that prolong the QT interval (abnormal heart rhythm) This list may not describe all possible interactions. Give your health care provider a list of all the medicines, herbs, non-prescription drugs, or dietary supplements you use. Also tell them if you smoke, drink alcohol, or use illegal drugs. Some items may interact with your medicine. What should I watch for while using this medicine? Visit your doctor or health care professional for regular checks on your progress. During the first weeks of treatment, your symptoms may get worse, but then will improve as you continue your treatment. You may get hot flashes, increased bone pain, increased difficulty passing urine, or an aggravation of nerve symptoms. Discuss these effects with your doctor or health care professional, some of them may improve with continued use of this  medicine. Male patients may experience a menstrual cycle or spotting during the first months of therapy with this medicine. If this continues, contact your doctor or health care professional. This medicine may increase blood  sugar. Ask your healthcare provider if changes in diet or medicines are needed if you have diabetes. What side effects may I notice from receiving this medicine? Side effects that you should report to your doctor or health care professional as soon as possible:  allergic reactions like skin rash, itching or hives, swelling of the face, lips, or tongue  breathing problems  chest pain  depression or memory disorders  pain in your legs or groin  pain at site where injected or implanted  seizures  severe headache  signs and symptoms of high blood sugar such as being more thirsty or hungry or having to urinate more than normal. You may also feel very tired or have blurry vision  swelling of the feet and legs  suicidal thoughts or other mood changes  visual changes  vomiting Side effects that usually do not require medical attention (report to your doctor or health care professional if they continue or are bothersome):  breast swelling or tenderness  decrease in sex drive or performance  diarrhea  hot flashes  loss of appetite  muscle, joint, or bone pains  nausea  redness or irritation at site where injected or implanted  skin problems or acne This list may not describe all possible side effects. Call your doctor for medical advice about side effects. You may report side effects to FDA at 1-800-FDA-1088. Where should I keep my medicine? This drug is given in a hospital or clinic and will not be stored at home. NOTE: This sheet is a summary. It may not cover all possible information. If you have questions about this medicine, talk to your doctor, pharmacist, or health care provider.  2021 Elsevier/Gold Standard (2019-01-26 10:35:13)  Denosumab injection What is this medicine? DENOSUMAB (den oh sue mab) slows bone breakdown. Prolia is used to treat osteoporosis in women after menopause and in men, and in people who are taking corticosteroids for 6 months or more.  Delton See is used to treat a high calcium level due to cancer and to prevent bone fractures and other bone problems caused by multiple myeloma or cancer bone metastases. Delton See is also used to treat giant cell tumor of the bone. This medicine may be used for other purposes; ask your health care provider or pharmacist if you have questions. COMMON BRAND NAME(S): Prolia, XGEVA What should I tell my health care provider before I take this medicine? They need to know if you have any of these conditions:  dental disease  having surgery or tooth extraction  infection  kidney disease  low levels of calcium or Vitamin D in the blood  malnutrition  on hemodialysis  skin conditions or sensitivity  thyroid or parathyroid disease  an unusual reaction to denosumab, other medicines, foods, dyes, or preservatives  pregnant or trying to get pregnant  breast-feeding How should I use this medicine? This medicine is for injection under the skin. It is given by a health care professional in a hospital or clinic setting. A special MedGuide will be given to you before each treatment. Be sure to read this information carefully each time. For Prolia, talk to your pediatrician regarding the use of this medicine in children. Special care may be needed. For Delton See, talk to your pediatrician regarding the use of this medicine in children.  While this drug may be prescribed for children as young as 13 years for selected conditions, precautions do apply. Overdosage: If you think you have taken too much of this medicine contact a poison control center or emergency room at once. NOTE: This medicine is only for you. Do not share this medicine with others. What if I miss a dose? It is important not to miss your dose. Call your doctor or health care professional if you are unable to keep an appointment. What may interact with this medicine? Do not take this medicine with any of the following medications:  other  medicines containing denosumab This medicine may also interact with the following medications:  medicines that lower your chance of fighting infection  steroid medicines like prednisone or cortisone This list may not describe all possible interactions. Give your health care provider a list of all the medicines, herbs, non-prescription drugs, or dietary supplements you use. Also tell them if you smoke, drink alcohol, or use illegal drugs. Some items may interact with your medicine. What should I watch for while using this medicine? Visit your doctor or health care professional for regular checks on your progress. Your doctor or health care professional may order blood tests and other tests to see how you are doing. Call your doctor or health care professional for advice if you get a fever, chills or sore throat, or other symptoms of a cold or flu. Do not treat yourself. This drug may decrease your body's ability to fight infection. Try to avoid being around people who are sick. You should make sure you get enough calcium and vitamin D while you are taking this medicine, unless your doctor tells you not to. Discuss the foods you eat and the vitamins you take with your health care professional. See your dentist regularly. Brush and floss your teeth as directed. Before you have any dental work done, tell your dentist you are receiving this medicine. Do not become pregnant while taking this medicine or for 5 months after stopping it. Talk with your doctor or health care professional about your birth control options while taking this medicine. Women should inform their doctor if they wish to become pregnant or think they might be pregnant. There is a potential for serious side effects to an unborn child. Talk to your health care professional or pharmacist for more information. What side effects may I notice from receiving this medicine? Side effects that you should report to your doctor or health care  professional as soon as possible:  allergic reactions like skin rash, itching or hives, swelling of the face, lips, or tongue  bone pain  breathing problems  dizziness  jaw pain, especially after dental work  redness, blistering, peeling of the skin  signs and symptoms of infection like fever or chills; cough; sore throat; pain or trouble passing urine  signs of low calcium like fast heartbeat, muscle cramps or muscle pain; pain, tingling, numbness in the hands or feet; seizures  unusual bleeding or bruising  unusually weak or tired Side effects that usually do not require medical attention (report to your doctor or health care professional if they continue or are bothersome):  constipation  diarrhea  headache  joint pain  loss of appetite  muscle pain  runny nose  tiredness  upset stomach This list may not describe all possible side effects. Call your doctor for medical advice about side effects. You may report side effects to FDA at 1-800-FDA-1088. Where should I keep my  medicine? This medicine is only given in a clinic, doctor's office, or other health care setting and will not be stored at home. NOTE: This sheet is a summary. It may not cover all possible information. If you have questions about this medicine, talk to your doctor, pharmacist, or health care provider.  2021 Elsevier/Gold Standard (2017-07-03 16:10:44)

## 2020-04-20 LAB — PROSTATE-SPECIFIC AG, SERUM (LABCORP): Prostate Specific Ag, Serum: <0.1 ng/mL (ref 0.0–4.0)

## 2020-04-20 NOTE — Telephone Encounter (Signed)
error 

## 2020-05-06 NOTE — Progress Notes (Deleted)
No show

## 2020-05-07 ENCOUNTER — Encounter: Payer: Medicare HMO | Admitting: Pain Medicine

## 2020-05-10 DIAGNOSIS — H43812 Vitreous degeneration, left eye: Secondary | ICD-10-CM | POA: Diagnosis not present

## 2020-05-10 DIAGNOSIS — H353212 Exudative age-related macular degeneration, right eye, with inactive choroidal neovascularization: Secondary | ICD-10-CM | POA: Diagnosis not present

## 2020-05-10 DIAGNOSIS — H40023 Open angle with borderline findings, high risk, bilateral: Secondary | ICD-10-CM | POA: Diagnosis not present

## 2020-05-10 DIAGNOSIS — H353122 Nonexudative age-related macular degeneration, left eye, intermediate dry stage: Secondary | ICD-10-CM | POA: Diagnosis not present

## 2020-05-10 DIAGNOSIS — H5213 Myopia, bilateral: Secondary | ICD-10-CM | POA: Diagnosis not present

## 2020-05-10 DIAGNOSIS — H02831 Dermatochalasis of right upper eyelid: Secondary | ICD-10-CM | POA: Diagnosis not present

## 2020-05-10 DIAGNOSIS — H35372 Puckering of macula, left eye: Secondary | ICD-10-CM | POA: Diagnosis not present

## 2020-05-10 DIAGNOSIS — H02834 Dermatochalasis of left upper eyelid: Secondary | ICD-10-CM | POA: Diagnosis not present

## 2020-05-10 DIAGNOSIS — Z961 Presence of intraocular lens: Secondary | ICD-10-CM | POA: Diagnosis not present

## 2020-05-11 ENCOUNTER — Telehealth: Payer: Self-pay | Admitting: Internal Medicine

## 2020-05-11 NOTE — Telephone Encounter (Signed)
called for referral, no further information given at time message was taken. returned call lvm to call the office.

## 2020-05-28 MED ORDER — VITAMIN B-12 1000 MCG PO TABS: ORAL_TABLET | ORAL | Status: AC

## 2020-05-28 NOTE — Progress Notes (Signed)
History of Present Illness:       This very nice 87 y.o. DWM presents for  follow up with HTN, HLD, ASHD/pAfib, Pre-Diabetes and Vitamin D Deficiency.        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 Patient is receiving ongoing Lupron at [facility] and is followed closely by Dr Alen Blew on Gillermina Phy daily  & Xgeva inj  q9months.  Patient is on chronic Opioids followed in pain management.        Patient is treated for HTN  Circa 2005 & BP has been controlled at home. Today's BP is at goal - 118/68.  patirent does have hx/o cardiomyopathy & pAfib. Patient has had no complaints of any cardiac type chest pain, palpitations, dyspnea / orthopnea / PND, dizziness, claudication, or dependent edema.       Hyperlipidemia is controlled with diet & meds. Patient denies myalgias or other med SE's. Last Lipids were  Lab Results  Component Value Date   CHOL 235 (H) 04/27/2019   HDL 43 04/27/2019   LDLCALC not calculated 04/27/2019     Comment:   TRIG 487 (H) 04/27/2019   CHOLHDL 5.5 (H) 04/27/2019     Also, the patient has history of PreDiabetes  (A1c 6.3% / 2016) and has had no symptoms of reactive hypoglycemia, diabetic polys, paresthesias or visual blurring.  Last A1c was not at goal:  Lab Results  Component Value Date   HGBA1C 5.9 (H) 04/27/2019        Further, the patient also has history of Vitamin D Deficiency  ("39" on Tx /2019) and supplements vitamin D without any suspected side-effects. Last vitamin D was still very low:  Lab Results  Component Value Date   VD25OH 25 (L) 04/27/2019    Current Outpatient Medications on File Prior to Visit  Medication Sig  . aspirin EC 81 MG tablet Take daily.  . Calcium Carbonate Antacid  Take 1,800 mg  2 (times daily with a meal.   . VITAMIN D5000 u  Take  daily.   Gillermina Phy 40 MG capsule Take 2 capsules (80 mg total) by mouth daily.  . furosemide  40 MG tablet Take 40 mg by  mouth every Monday, Wednesday, and Friday.  . gabapentin  600 MG tablet TAKE  1  TAB 2  TIMES DAILY FOR  CHRONIC  PAIN   . labetalol  100 MG tablet Take 0.5 tablets  2 times daily.  [name] Kitchen losartan 100 MG tablet Take 1 tablet Daily for BP   . omeprazole  20 MG capsule Take  1 capsule  Daily  to Prevent Heartburn & Indigestion  . oxyCODONE  5 MG Take 1 tablet  every 6  hours as needed for severe pain.      Allergies  Allergen Reactions  . Ace Inhibitors Swelling and Other (See Comments)    Angioedema  . Hydrocodone Nausea And Vomiting    PMHx:   Past Medical History:  Diagnosis Date  . Arthritis   . Atrial fibrillation ([place])   . Bone cancer ([place])   . Cardiomyopathy ([place])   . Chronic kidney disease   . Hyperlipidemia   . Hypertension   . Prostate cancer ([place]) 1997  . Unstable gait 04/16/2018   unstable gait in home    Immunization History  Administered Date(s) Administered  . Influenza, High Dose Seasonal PF 02/19/2015, 01/08/2017, 03/25/2018, 11/18/2018  . PFIZER(Purple  Top)SARS-COV-2 Vaccination 05/09/2019, 06/07/2019    Past Surgical History:  Procedure Laterality Date  . CATARACT EXTRACTION    . EYE SURGERY Bilateral    IOL/CE on Lt in 1998 and Rt in 2011.  [name] Kitchen PROSTATE SURGERY    . WRIST FOREIGN BODY REMOVAL     1957 glass    FHx:    Reviewed / unchanged  SHx:    Reviewed / unchanged   Systems Review:  Constitutional: Denies fever, chills, wt changes, headaches, insomnia, fatigue, night sweats, change in appetite. Eyes: Denies redness, blurred vision, diplopia, discharge, itchy, watery eyes.  ENT: Denies discharge, congestion, post nasal drip, epistaxis, sore throat, earache, hearing loss, dental pain, tinnitus, vertigo, sinus pain, snoring.  CV: Denies chest pain, palpitations, irregular heartbeat, syncope, dyspnea, diaphoresis, orthopnea, PND, claudication or edema. Respiratory: denies cough, dyspnea, DOE, pleurisy, hoarseness, laryngitis, wheezing.   Gastrointestinal: Denies dysphagia, odynophagia, heartburn, reflux, water brash, abdominal pain or cramps, nausea, vomiting, bloating, diarrhea, constipation, hematemesis, melena, hematochezia  or hemorrhoids. Genitourinary: Denies dysuria, frequency, urgency, nocturia, hesitancy, discharge, hematuria or flank pain. Musculoskeletal: Denies arthralgias, myalgias, stiffness, jt. swelling, pain, limping or strain/sprain.  Skin: Denies pruritus, rash, hives, warts, acne, eczema or change in skin lesion(s). Neuro: No weakness, tremor, incoordination, spasms, paresthesia or pain. Psychiatric: Denies confusion, memory loss or sensory loss. Endo: Denies change in weight, skin or hair change.  Heme/Lymph: No excessive bleeding, bruising or enlarged lymph nodes.  Physical Exam  BP 118/68   Pulse 64   Temp 97.7 F (36.5 C)   Resp 16   Ht 5\' 7"  (1.702 m)   Wt 175 lb 6.4 oz (79.6 kg)   SpO2 99%   BMI 27.47 kg/m   Appears  well nourished, well groomed  and in no distress.  Eyes: PERRLA, EOMs, conjunctiva no swelling or erythema. Sinuses: No frontal/maxillary tenderness ENT/Mouth: EAC's clear, TM's nl w/o erythema, bulging. Nares clear w/o erythema, swelling, exudates. Oropharynx clear without erythema or exudates. Oral hygiene is good. Tongue normal, non obstructing. Hearing intact.  Neck: Supple. Thyroid not palpable. Car 2+/2+ without bruits, nodes or JVD. Chest: Respirations nl with BS clear & equal w/o rales, rhonchi, wheezing or stridor.  Cor: Heart sounds normal w/ regular rate and rhythm without sig. murmurs, gallops, clicks or rubs. Peripheral pulses normal and equal  without edema.  Abdomen: Soft & bowel sounds normal. Non-tender w/o guarding, rebound, hernias, masses or organomegaly.  Lymphatics: Unremarkable.  Musculoskeletal: Full ROM all peripheral extremities, joint stability, 5/5 strength and normal gait.  Skin: Warm, dry without exposed rashes, lesions or ecchymosis apparent.   Neuro: Cranial nerves intact, reflexes equal bilaterally. Sensory-motor testing grossly intact. Tendon reflexes grossly intact.  Pysch: Alert & oriented x 3.  Insight and judgement nl & appropriate. No ideations.  Assessment and Plan:  - Continue medication, monitor blood pressure at home.  - Continue DASH diet.  Reminder to go to the ER if any CP,  SOB, nausea, dizziness, severe HA, changes vision/speech.  - Continue diet/meds, exercise,& lifestyle modifications.  - Continue monitor periodic cholesterol/liver & renal functions    - Continue diet, exercise  - Lifestyle modifications.  - Monitor appropriate labs. - Continue supplementation.       Discussed  regular exercise, BP monitoring, weight control to achieve/maintain BMI less than 25 and discussed med and SE's. Recommended labs to assess and monitor clinical status with further disposition pending results of labs.  I discussed the assessment and treatment plan with the patient. The patient  was provided an opportunity to ask questions and all were answered. The patient agreed with the plan and demonstrated an understanding of the instructions.  I provided over 30 minutes of exam, counseling, chart review and  complex critical decision making.         The patient was advised to call back or seek an in-person evaluation if the symptoms worsen or if the condition fails to improve as anticipated.   Kirtland Bouchard, MD

## 2020-05-29 ENCOUNTER — Ambulatory Visit (INDEPENDENT_AMBULATORY_CARE_PROVIDER_SITE_OTHER): Payer: Medicare HMO | Admitting: Internal Medicine

## 2020-05-29 ENCOUNTER — Other Ambulatory Visit: Payer: Self-pay

## 2020-05-29 ENCOUNTER — Encounter: Payer: Self-pay | Admitting: Internal Medicine

## 2020-05-29 VITALS — BP 118/68 | HR 64 | Temp 97.7°F | Resp 16 | Ht 67.0 in | Wt 175.4 lb

## 2020-05-29 DIAGNOSIS — C61 Malignant neoplasm of prostate: Secondary | ICD-10-CM

## 2020-05-29 DIAGNOSIS — E782 Mixed hyperlipidemia: Secondary | ICD-10-CM | POA: Diagnosis not present

## 2020-05-29 DIAGNOSIS — R7309 Other abnormal glucose: Secondary | ICD-10-CM

## 2020-05-29 DIAGNOSIS — Z79899 Other long term (current) drug therapy: Secondary | ICD-10-CM | POA: Diagnosis not present

## 2020-05-29 DIAGNOSIS — E559 Vitamin D deficiency, unspecified: Secondary | ICD-10-CM | POA: Diagnosis not present

## 2020-05-29 DIAGNOSIS — C7951 Secondary malignant neoplasm of bone: Secondary | ICD-10-CM | POA: Diagnosis not present

## 2020-05-29 DIAGNOSIS — I10 Essential (primary) hypertension: Secondary | ICD-10-CM

## 2020-05-29 DIAGNOSIS — N1832 Chronic kidney disease, stage 3b: Secondary | ICD-10-CM | POA: Diagnosis not present

## 2020-05-29 DIAGNOSIS — I480 Paroxysmal atrial fibrillation: Secondary | ICD-10-CM | POA: Diagnosis not present

## 2020-05-29 MED ORDER — GABAPENTIN 600 MG PO TABS
ORAL_TABLET | ORAL | 1 refills | Status: AC
Start: 2020-05-29 — End: ?

## 2020-05-29 NOTE — Patient Instructions (Signed)

## 2020-05-30 LAB — CBC WITH DIFFERENTIAL/PLATELET
Absolute Monocytes: 510 cells/uL (ref 200–950)
Basophils Absolute: 20 cells/uL (ref 0–200)
Basophils Relative: 0.4 %
Eosinophils Absolute: 158 cells/uL (ref 15–500)
Eosinophils Relative: 3.1 %
HCT: 34.1 % — ABNORMAL LOW (ref 38.5–50.0)
Hemoglobin: 11.7 g/dL — ABNORMAL LOW (ref 13.2–17.1)
Lymphs Abs: 1418 cells/uL (ref 850–3900)
MCH: 35 pg — ABNORMAL HIGH (ref 27.0–33.0)
MCHC: 34.3 g/dL (ref 32.0–36.0)
MCV: 102.1 fL — ABNORMAL HIGH (ref 80.0–100.0)
MPV: 10.3 fL (ref 7.5–12.5)
Monocytes Relative: 10 %
Neutro Abs: 2994 cells/uL (ref 1500–7800)
Neutrophils Relative %: 58.7 %
Platelets: 133 10*3/uL — ABNORMAL LOW (ref 140–400)
RBC: 3.34 10*6/uL — ABNORMAL LOW (ref 4.20–5.80)
RDW: 13.5 % (ref 11.0–15.0)
Total Lymphocyte: 27.8 %
WBC: 5.1 10*3/uL (ref 3.8–10.8)

## 2020-05-30 LAB — COMPLETE METABOLIC PANEL WITH GFR
AG Ratio: 1.8 (calc) (ref 1.0–2.5)
ALT: 11 U/L (ref 9–46)
AST: 19 U/L (ref 10–35)
Albumin: 4 g/dL (ref 3.6–5.1)
Alkaline phosphatase (APISO): 43 U/L (ref 35–144)
BUN/Creatinine Ratio: 17 (calc) (ref 6–22)
BUN: 27 mg/dL — ABNORMAL HIGH (ref 7–25)
CO2: 29 mmol/L (ref 20–32)
Calcium: 10.3 mg/dL (ref 8.6–10.3)
Chloride: 104 mmol/L (ref 98–110)
Creat: 1.56 mg/dL — ABNORMAL HIGH (ref 0.70–1.11)
GFR, Est African American: 46 mL/min/{1.73_m2} — ABNORMAL LOW (ref >=60)
GFR, Est Non African American: 40 mL/min/{1.73_m2} — ABNORMAL LOW (ref >=60)
Globulin: 2.2 g/dL (calc) (ref 1.9–3.7)
Glucose, Bld: 103 mg/dL — ABNORMAL HIGH (ref 65–99)
Potassium: 4.4 mmol/L (ref 3.5–5.3)
Sodium: 141 mmol/L (ref 135–146)
Total Bilirubin: 0.6 mg/dL (ref 0.2–1.2)
Total Protein: 6.2 g/dL (ref 6.1–8.1)

## 2020-05-30 LAB — VITAMIN D 25 HYDROXY (VIT D DEFICIENCY, FRACTURES): Vit D, 25-Hydroxy: 40 ng/mL (ref 30–100)

## 2020-05-30 LAB — HEMOGLOBIN A1C
Hgb A1c MFr Bld: 5.9 % of total Hgb — ABNORMAL HIGH (ref <5.7)
Mean Plasma Glucose: 123 mg/dL
eAG (mmol/L): 6.8 mmol/L

## 2020-05-30 LAB — INSULIN, RANDOM: Insulin: 22 u[IU]/mL — ABNORMAL HIGH

## 2020-05-30 LAB — TSH: TSH: 4.13 mIU/L (ref 0.40–4.50)

## 2020-05-30 LAB — MAGNESIUM: Magnesium: 2 mg/dL (ref 1.5–2.5)

## 2020-05-31 DIAGNOSIS — Z79891 Long term (current) use of opiate analgesic: Secondary | ICD-10-CM | POA: Insufficient documentation

## 2020-05-31 DIAGNOSIS — F1120 Opioid dependence, uncomplicated: Secondary | ICD-10-CM | POA: Insufficient documentation

## 2020-05-31 NOTE — Progress Notes (Deleted)
No show

## 2020-06-04 ENCOUNTER — Encounter: Payer: Medicare HMO | Admitting: Pain Medicine

## 2020-06-04 DIAGNOSIS — Z79891 Long term (current) use of opiate analgesic: Secondary | ICD-10-CM

## 2020-06-04 DIAGNOSIS — C7951 Secondary malignant neoplasm of bone: Secondary | ICD-10-CM

## 2020-06-04 DIAGNOSIS — G894 Chronic pain syndrome: Secondary | ICD-10-CM

## 2020-06-04 DIAGNOSIS — G8929 Other chronic pain: Secondary | ICD-10-CM

## 2020-06-04 DIAGNOSIS — Z79899 Other long term (current) drug therapy: Secondary | ICD-10-CM

## 2020-06-04 DIAGNOSIS — S32020S Wedge compression fracture of second lumbar vertebra, sequela: Secondary | ICD-10-CM

## 2020-06-04 DIAGNOSIS — F1120 Opioid dependence, uncomplicated: Secondary | ICD-10-CM

## 2020-06-04 DIAGNOSIS — M48061 Spinal stenosis, lumbar region without neurogenic claudication: Secondary | ICD-10-CM

## 2020-06-04 DIAGNOSIS — G893 Neoplasm related pain (acute) (chronic): Secondary | ICD-10-CM

## 2020-06-04 DIAGNOSIS — C61 Malignant neoplasm of prostate: Secondary | ICD-10-CM

## 2020-06-05 NOTE — Progress Notes (Signed)
* * *        **  [name]    --- ---    68 Y old Male, DOB: [birth year]    [address] , [place], [place] [address]    Home: [phone]    Provider: [name]        * * *    Telephone Encounter    ---    Answered by   Real Cons  Date: 08/28/2017         Time: 09:17 AM    Reason   okay for Lupron    --- ---            Action Taken                      [name],[name]  08/28/2017 9:17:31 AM > spoke with Elita Quick at [facility] okay for Lupron- no referral needed      [name],[name]  08/28/2017 11:44:14 AM > noted                    * * *                ---          * * *          Patient: [name] DOB: 10-06-1931 Provider: [name]  08/28/2017    ---    Note generated by eClinicalWorks EMR/PM Software ([email or web address])

## 2020-06-05 NOTE — Progress Notes (Signed)
* * *        **[name]    --- ---    23 Y old Male, DOB: [birth year]    Account Number: [id number]    [address] [place], [place]    Home: [phone]    Guarantor: [name], [name] Insurance: [name] Medicare Complete Payer ID:  [id number]    PCP: Tyler Musa, MD Referring: Tyler Musa, MD    Appointment Facility: [facility], [facility].        * * *    08/27/2016  Progress Notes: [name]. [name], [name]    --- ---    ---        Current Medications    ---    Taking     * Centrum Silver Tablet as directed Orally     ---    * Hydrochlorothiazide 25 MG Tablet 1 tablet Orally Once a day    ---    * Omeprazole 20 MG Capsule Delayed Release 1 capsule Orally Once a day    ---    * Xanax 0.25 MG Tablet 1 tablet Orally Twice a day    ---    * Hydrocodone-Acetaminophen 5-325 MG Tablet 1 tablet as needed Orally every 6 hrs    ---    * Amiodarone HCl 200 MG Tablet 1 tablet Orally Once a day    ---    * Zestril 5 MG Tablet 1 tablet Orally Once a day    ---    Not-Taking/PRN    * Zocor 10 MG Tablet 1 tablet every evening Orally Once a day    ---    * Aspirin 81 MG Tablet Delayed Release 1 tablet Orally Once a day    ---    * Xarelto 15 MG Tablet Orally     ---    * Medication List reviewed and reconciled with the patient    ---      Past Medical History    ---       prostate cancer: Dx'd 7/06; S/P XRT: 11/06-1/07: gleason's 8.        ---    GERD.        ---    Hypercholesterolemia.        ---    Hypertension.        ---    Anxiety.        ---    Atrial Fibrillation: @ 2017.        ---    ED - Impotence of organic origin.        ---    Renal insufficiency.        ---      Surgical History    ---      Trans Rectal [place] and Prostate BX 09/2004    ---    cholecystectomy 04/2004    ---    inguinal hernia repair    ---      Social History    ---    Alcohol: yes, occasional.    no Smoking status: never smoker, Patient counseled on the dangers of tobacco  use: 08/20/2016.    no Caffeine.       Allergies    ---      N.K.D.A.    ---        Reason for Appointment    ---      1\. Hematuria    ---      History of Present Illness    ---  _Urology Follow Up_ :    Hematuria here for cystoscopy.    he is voiding well with no more hematuria.      Vital Signs    ---    Ht 5'7", Wt 164, BMI 25.68, BP 140/78, Temp 97.3.      Assessments    ---    1\. Gross hematuria - R31.0 (Primary)    ---    2\. Prostate cancer - C61    ---    3\. 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 Gross hematuria**    _LAB: PSA Diagnostic_     [name] 08/27/2016 8:43:30 AM >    --- ---        Notes: His work up was c/w with radiation cystitis: see that discussion:.        **2\. Prostate cancer**    Notes: I discussed this with the pt and his daughter at length: his PSA was 38  but may have been acutely elevated a little since it was during this acute  radiation cystitis attack. I want to repeat it in 3 months but if still up he  will need a bone scan and I discussed with both of them about the next step in  treatment: hormonal therapy I explained that this is not unusual over 11 years  out form hsi radiation and thus his radiation may have controlled the cancer  but was not a cure.        **3\. Radiation cystitis**    Notes: We discussed this as well and will observe for now but they understand  that sometimes this can get worse. Also if he needs anticoagulants for his  atrial fibrillation he is at risk more bleeding due to this.        **4\. Others**    Notes: Patient Educated with: www.[email or web address] ([email or web address]).    Clinical Notes: Finally, he lives in [place] and here. He has a doc in  [place] but no urologist., I strongly encouraged him to get a GU doc down there in  case he has more episodes of radiation cystitis and also since his PSA is  rising and he may eventually need hormonal therapy. He and his daughter  understand my concerns about this.      Procedures    ---     _Cystourethroscopy_ :     Indication Hematuria. Procedure Cystourethroscopy done under sterile  precautions, Patient was prepped and draped, A 15 F flexible cystoscope was  used.. Bladder No PVR, No tumors noted, Trabeculation was grade 2, bladder c/w  with radiation changes and increased vascularity, no stones. Urethra no  inflammation, no stricture, normal. Trigone ureteral orifices seen with clear  efflux, Normal. prostate small non-obstructive prostate s/p radiation.            Labs    --- ---    [name]: ClinitekStatus SG_    ---       Color  yellow/clear     \-    --- --- --- ---    GLU  Negative     \-    --- --- --- ---    BIL  Negative     \-    --- --- --- ---    KET  Negative     \-    --- --- --- ---    SG  1.020     \-    --- --- --- ---  BLO  Negative     \-    --- --- --- ---    pH  5.5     \-    --- --- --- ---    PRO  Negative     \-    --- --- --- ---    URO  0.2 E.U./dL     \-    --- --- --- ---    NIT  Negative     \-    --- --- --- ---    LEU  Negative     \-    --- --- --- ---         [name] 08/27/2016 7:47:06 AM > [name] E 08/27/2016  8:13:23 AM >    --- ---      Procedure Codes    ---      91478 UA Automated    ---    52000 CYSTO    ---      Follow Up    ---    routine OV, 3 Monthsw/ PSA first    Electronically signed by [name] , MD on 08/27/2016 at 08:49 AM EDT    Sign off status: Completed        * * [facility]    [address]    [place], [place] [id number]    Tel: [phone]    Fax: [phone]              * * *          Patient: [name], [name] DOB: Feb 06, 1932 Progress Note: [name].  [name], [name] 08/27/2016    ---    Note generated by eClinicalWorks EMR/PM Software ([email or web address])

## 2020-06-05 NOTE — Progress Notes (Signed)
* * *        **[name]    --- ---    76 Y old Male, DOB: 02-Oct-1931    Account Number: [id number]    [address] , [place], [place]    Home: [phone]    Guarantor: [name], Jos Insurance: [name] Medicare Complete Payer ID:  [id number]    PCP: [name], MD    Appointment Facility: [facility], [facility].        * * *    08/28/2017  Progress Notes: [name], [name]    --- ---    ---         **Current Medications**    ---    Taking     * Omeprazole 20 MG Capsule Delayed Release 1 capsule Orally Once a day    ---    * Acetaminophen 500 MG Capsule 1 capsule as needed Orally every 6 hrs    ---    * Fentanyl 50 MCG/HR Patch 72 Hour 1 patch to skin Transdermal     ---    * Gabapentin 300 MG Capsule 1 capsule Orally Once a day    ---    * Lisinopril 5 MG Tablet 1 tablet Orally Once a day    ---    * Metoprolol Succinate ER 25 MG Tablet Extended Release 24 Hour 1 tablet Orally Once a day    ---    * MiraLax - Powder as directed Orally     ---    * Oxycodone HCl 5 MG/5ML Solution 5 ml as needed Orally every 6 hrs    ---    * Milk of Magnesia 400 MG/5ML Suspension 5 ml as needed Orally Four times a day    ---    * Bisacodyl 10 MG Suppository 1 suppository as needed Rectal Once a day    ---    * Aspirin 81 MG Tablet Delayed Release 1 tablet Orally Once a day    ---    * Casodex 50 MG Tablet 1 tablet Orally Once a day    ---    Not-Taking/PRN    * Centrum Silver Tablet as directed Orally     ---    * Hydrochlorothiazide 25 MG Tablet 1 tablet Orally Once a day    ---    * Xanax 0.25 MG Tablet 1 tablet Orally Twice a day    ---    * Hydrocodone-Acetaminophen 5-325 MG Tablet 1 tablet as needed Orally every 6 hrs    ---    * Amiodarone HCl 200 MG Tablet 1 tablet Orally Once a day    ---    * Zestril 5 MG Tablet 1 tablet Orally Once a day    ---    * Zocor 10 MG Tablet 1 tablet every evening Orally Once a day    ---    * Xarelto 15 MG Tablet Orally     ---    * Medication List  reviewed and reconciled with the patient    ---      Past Medical History    ---       prostate cancer: Dx'd 7/06; S/P XRT: 11/06-1/07: gleason's 8.        ---    GERD.        ---    Hypercholesterolemia.        ---    Hypertension.        ---  Anxiety.        ---    Atrial Fibrillation: @ 2017.        ---    ED - Impotence of organic origin.        ---    Renal insufficiency.        ---       **Surgical History**    ---       Trans Rectal [place] and Prostate BX 09/2004    ---    cholecystectomy 04/2004    ---    inguinal hernia repair    ---    cystoscopy: c/w rad changes 08/27/2016    ---    cardioversion    ---      **Family History**    ---       No Family History documented.    ---       **Social History**    ---    Alcohol: yes, occasional.    no Smoking status: never smoker, Patient counseled on the dangers of tobacco  use: 08/20/2016.    no Caffeine.      **Allergies**    ---       N.K.D.A.    ---       **Hospitalization/Major Diagnostic Procedure**    ---       fall/compressionfx c/we met disease 5/11-5/15/19    ---         **Reason for Appointment**    ---       1\. First lupron shot    ---    2\. Prostate cancer    ---       **History of Present Illness**    ---     _Urological History_ :    patient is here for 1st Lupron injection.       **Vital Signs**    ---    Ht 5'7", Wt 160, BMI 25.06, BP 130/62, Temp 97.8, RR 16.       **Examination**    ---     _General_ :    General appearance:  A&O x 3, no acute distress, pleasant. Lungs/Respirations:  No labored breathing. general  **alert, oriented**.          **Assessments**    ---    1\. Prostate cancer - C61 (Primary)    ---       **Treatment**    ---       **1\. Prostate cancer**    Notes: Patient came in today for 1st Lupron injection. Went over what to  expect at injection site and if area is tender, sore, apply warm compress to  help relieve discomfort. Most common side effects are hot flashes/night  sweats. Gave pt Lupron insert to look over other s/s and  report anything  unusal or concerning. Services provided by Karoline Caldwell RN Patient  Educated with: www.uptodate.com ([email or web address]).    ---      **Therapeutic Injections**    ---       Lupron : 22.5 mg (Dose No:1) (Route: Intramuscular) given by Karoline Caldwell on right gluteus (Prostate cancer)    ---       **Procedure Codes**    ---       Z6109 LUPRON    ---       **Follow Up**    ---    next appt 12/04/17 with MD    Electronically signed by Leola Brazil , MD on 08/30/2017 at 09:15 PM EDT    Sign  off status: Completed        * * [facility] [address] [facility]    [address]    [place], [place] [id number]    Tel: [phone]    Fax: [phone]              * * *          Patient: [name], [name] DOB: 07/12/1931 Progress Note: [name].  [name], [name] 08/28/2017    ---    Note generated by eClinicalWorks EMR/PM Software ([email or web address])

## 2020-06-05 NOTE — Progress Notes (Signed)
* * *        **  [name]    --- ---    8 Y old Male, DOB: 08/29/1931    [address] , [place], [place] [address]    Home: [phone]    Provider: [name]        * * *    Telephone Encounter    ---    Answered by   Karoline Caldwell  Date: 12/04/2017         Time: 02:32 PM    Reason   Urologist in [facility]    --- ---            Action Taken                      [facility]  12/04/2017 2:32:53 PM > Patient will be going down to [place] in a few weeks and staying there until April sometime. He will be due for a Lupron in January, then he can have the April Lupron if needed when he returns home. Dr. [name] wanted to make sure we got all the information on urologist he will be seeing in [place] so there's no gaps in his care or missed Lupron injection.  Here is the information:      Dr. Marinus Maw,       [address]      [place], [place] [address]   phone [phone]       Thank you [name]!      [name],[name]  12/07/2017 8:28:54 AM > notes sent                    * * *                ---          * * *          Patient: [name] DOB: 30-Aug-1931 Provider: [name]  12/04/2017    ---    Note generated by e[employer] EMR/PM Software ([email or web address])

## 2020-06-05 NOTE — Progress Notes (Signed)
* * *        **  [name]    --- ---    41 Y old Male, DOB: 1931-06-05    [address] , [place], [place] [address]    Home: [phone]    Provider: [name]        * * *    Telephone Encounter    ---    Answered by   Kathreen Devoid  Date: 02/23/2018         Time: 12:38 PM    Caller   Self    --- ---            Reason   Refill            Message                      Pt requesting refill of Fentanyl 25mg  patch. Pharmacy is [facility] [address] [place], [place]. Also wondering if we can prescribe anything else for the pain. CB# [phone]                Action Taken                      Ivos,Maria  02/23/2018 12:38:48 PM >      Puzio, Corinne L 02/23/2018 04:13:49 PM > Pt called about this issue at 4:00.  Requesting pain meds for chronic back pain.  I told him that I don't think that the fentanyl patches were prescribed through you.  Patient's spouse was shouting very loudly in the background at him.  He then hung up.                          * * *                ---          * * *          Patient: Daray, Polgar DOB: 02-Aug-1931 Provider: [name]  02/23/2018    ---    Note generated by eClinicalWorks EMR/PM Software ([email or web address])

## 2020-06-05 NOTE — Progress Notes (Signed)
* * *        **[name]    --- ---    51 Y old Male, DOB: [birth year]    [address] [place], [place] [address]    Home: [phone]    Provider: [name]        * * *    Telephone Encounter    ---    Answered by   [name]  Date: 07/22/2017         Time: 02:44 PM    Caller   [name]    --- ---            Reason   Please Advise            Message                      PT was seen at [facility] goign to [facility] for Rehab and needs sooner appt w Dr. [name] [phone] x [phone]                                  Action Taken                      [name],[name]  07/22/2017 2:45:00 PM >       [name],[name]  07/22/2017 3:42:46 PM > [name], please get any recent records.      [name],[name]  07/22/2017 3:50:11 PM > Recent [facility] info in doc      [name],[name]  07/22/2017 4:58:49 PM > please get [facility] notes       [name],[name]  07/23/2017 9:19:16 AM > info in docs       [name],[name]  07/23/2017 9:33:16 AM > pt was seen at [facility] back pain- possible mets to the spine. Pt cancelled 12/2016 appt and did not have bone scan that i can see as recommended at 08/2016 appt. i can try and get im in sooner do you want a bone scan? prior or just 15 minute ov?       [name] 07/23/2017 11:32:35 AM > he needs a visit and lets also get a PSA on him before he sees me: see virtual visit: maybe it can be drawn at [facility]?       [name],[name]  07/23/2017 11:46:33 AM > pt is at [facility]- spoke with roney order faxed order to [phone]- can you please call [facility] back with appt given, ty      [name],[name]  07/23/2017 3:10:29 PM > spoke to [name] and gave her appt info                    * * *              * * *        ---        Reason for Appointment    ---      1\. Please Advise    ---      Assessments    ---    1\. Prostate cancer - C61 (Primary)    ---      Treatment    ---       **1\. Prostate cancer**    _LAB: PSA Diagnostic_    ---          * *  *  Patient: [name], [name] DOB: 05/26/1931 Provider: [name]  07/22/2017    ---    Note generated by eClinicalWorks EMR/PM Software ([email or web address])

## 2020-06-05 NOTE — Progress Notes (Signed)
* * *        **[name]    --- ---    57 Y old Male, DOB: 12-10-1931    Account Number: [id number]    [address] , [place], [place]    Home: [phone]    Guarantor: [name], [name] Insurance: [name] Medicare Complete Payer ID:  [id number]    PCP: Tyler Muskrat, MD    Appointment Facility: [facility]        * * *    12/04/2017  Progress Notes: [name], [name]    --- ---    ---         **Current Medications**    ---    Taking     * Omeprazole 20 MG Capsule Delayed Release 1 capsule Orally Once a day    ---    * Acetaminophen 500 MG Capsule 1 capsule as needed Orally every 6 hrs    ---    * Fentanyl 50 MCG/HR Patch 72 Hour 1 patch to skin Transdermal     ---    * Gabapentin 300 MG Capsule 1 capsule Orally Once a day    ---    * Lisinopril 5 MG Tablet 1 tablet Orally Once a day    ---    * Metoprolol Succinate ER 25 MG Tablet Extended Release 24 Hour 1 tablet Orally Once a day    ---    * MiraLax - Powder as directed Orally     ---    * Oxycodone HCl 5 MG/5ML Solution 5 ml as needed Orally every 6 hrs    ---    * Milk of Magnesia 400 MG/5ML Suspension 5 ml as needed Orally Four times a day    ---    * Bisacodyl 10 MG Suppository 1 suppository as needed Rectal Once a day    ---    * Aspirin 81 MG Tablet Delayed Release 1 tablet Orally Once a day    ---    * Calcium     ---    Not-Taking/PRN    * Casodex 50 MG Tablet 1 tablet Orally Once a day    ---    * Centrum Silver Tablet as directed Orally     ---    * Hydrochlorothiazide 25 MG Tablet 1 tablet Orally Once a day    ---    * Xanax 0.25 MG Tablet 1 tablet Orally Twice a day    ---    * Hydrocodone-Acetaminophen 5-325 MG Tablet 1 tablet as needed Orally every 6 hrs    ---    * Amiodarone HCl 200 MG Tablet 1 tablet Orally Once a day    ---    * Zestril 5 MG Tablet 1 tablet Orally Once a day    ---    * Zocor 10 MG Tablet 1 tablet every evening Orally Once a day    ---    * Xarelto 15 MG Tablet Orally     ---    *  Medication List reviewed and reconciled with the patient    ---      Past Medical History    ---       prostate cancer: Dx'd 7/06; S/P XRT: 11/06-1/07: gleason's 8 and then Dx'd  with met prostate cancer in 07/2017.        ---    GERD.        ---    Hypercholesterolemia.        ---  Hypertension.        ---    Anxiety.        ---    Atrial Fibrillation: @ 2017.        ---    ED - Impotence of organic origin.        ---    Renal insufficiency.        ---       **Surgical History**    ---       Trans Rectal [place] and Prostate BX 09/2004    ---    cholecystectomy 04/2004    ---    inguinal hernia repair    ---    cystoscopy: c/w rad changes 08/27/2016    ---    cardioversion    ---      **Family History**    ---       No Family History documented.    ---       **Social History**    ---    Alcohol: yes, occasional.    no Caffeine.    no Smoking status: never smoker.      **Allergies**    ---       N.K.D.A.    ---       **Hospitalization/Major Diagnostic Procedure**    ---       fall/compressionfx c/we met disease 5/11-5/15/19    ---       **Review of Systems**    ---     _General_ :    no Fever. no Chills. no Weakness. no Abdominal pain.    _Endocrine_ :    Patient complains of: not flashes.    _Gastroenterology_ :    no Constipation. no Diarrhea.    _Urology_ :    no Blood in urine. no Frequent urination. no Urinary incontinence.            **Reason for Appointment**    ---       1\. 3 mo follow up    ---    2\. Prostate cancer follow up    ---       **History of Present Illness**    ---     _Urological History_ :    pt is voiding OK, mod stream, empties OK, no dysuria, double voids, no  hematuria: getting some hot flashes: can tolerate and manage it: on the Xgeva  injeciton via medical onc.       **Vital Signs**    ---    Ht 5'7", Wt 168, BMI 26.31, BP 126/74, Temp 98.1.       **Examination**    ---     _General_ :    General appearance:  A&O x 3, no acute distress, pleasant. HEENT  Head:  normocephalic, head: atraumatic,  no icterus. Lungs/Respirations:  No labored  breathing, good excursion. Abdomen:  normal. Musculoskeletal:  has a brace and  walks slowly. Skin:  normal turgor.    GU exam deferred.           **Assessments**    ---    1\. Prostate cancer - C61 (Primary)    ---       **Treatment**    ---       **1\. Prostate cancer**    Notes: I discussed this with the pt and his daughter. he is seeing med onc as  well and his recent PSA was negligible. He is "tolerating" the hot flashes and  I told him that if it gets worse  we can consider treating that but he is OK  with it. For now the biggest issue remains social: he will be going back down  to [place] soon and will need a doc down there ( they have one and he  is due for this next Lupron in early January and then if he comes back up here  he would need another one here in the spring. ) I told the daughter to stay in  touch with [place] so we know when he will be coming up and we can check the exact  date of his next Lupron. As far as the Xgeva:and calcium issues that will be  set up by the medical oncology dept. .    ---      **Therapeutic Injections**    ---       Lupron : 22.5 mg (Dose No:1) (Route: Intramuscular) given by [name] on left gluteus (Prostate cancer)    ---         **Labs**    --- ---    _Lab: ClinitekStatus SG_    ---       GLU  Negative     \-    --- --- --- ---    BIL  Negative     \-    --- --- --- ---    KET  Negative     \-    --- --- --- ---    SG  1.025     \-    --- --- --- ---    BLO  Trace-intact     \-    --- --- --- ---    pH  5.5     \-    --- --- --- ---    PRO  1+     \-    --- --- --- ---    URO  1.0 [name].U./dL     \-    --- --- --- ---    NIT  Negative     \-    --- --- --- ---    LEU  Negative     \-    --- --- --- ---         [name],[name] 12/04/2017 10:01:43 AM > [name] 12/04/2017  10:12:49 AM >    --- ---    [name]: Urine Microscopy_       WBC  0       --- --- --- ---    RBC  0-1       --- --- --- ---    Epithelial  0        --- --- --- ---    Bacteria  0       --- --- --- ---    Amorphous  trace       --- --- --- ---    Mucous  0       --- --- --- ---    Casts  0       --- --- --- ---    Other  0       --- --- --- ---         [name],[name] 12/04/2017 10:19:28 AM > Ralf Konopka [name] 12/04/2017  10:50:45 AM >    --- ---       **Procedure Codes**    ---       81003 UA Automated    ---    13086 MICROSCOPIC EXAM OF URINE    ---  V9563 LUPRON    ---       **Follow Up**    ---    depends on when he comes back from [facility]    Electronically signed by Leola Brazil , MD on 12/04/2017 at 12:45 PM EDT    Sign off status: Completed        * * [facility]    [address]    [place], [place] [id number]    Tel: [phone]    Fax: [phone]              * * *          Patient: [name], [name] DOB: Nov 26, 1931 Progress Note: [name].  [name], [name] 12/04/2017    ---    Note generated by eClinicalWorks EMR/PM Software ([email or web address])

## 2020-06-05 NOTE — Progress Notes (Signed)
* * *        **[name]    --- ---    47 Y old Male, DOB: 12-16-1931    Account Number: [id number]    [address] , [place], [place]    Home: [phone]    Guarantor: [name], Lawyer Insurance: [name] Medicare Complete Payer ID:  [id number]    PCP: [name], MD    Appointment Facility: [facility].        * * *    08/12/2017  Progress Notes: [name]. [name], [name]    --- ---    ---         **Current Medications**    ---    Taking     * Omeprazole 20 MG Capsule Delayed Release 1 capsule Orally Once a day    ---    * Acetaminophen 500 MG Capsule 1 capsule as needed Orally every 6 hrs    ---    * Fentanyl 50 MCG/HR Patch 72 Hour 1 patch to skin Transdermal     ---    * Gabapentin 300 MG Capsule 1 capsule Orally Once a day    ---    * Lisinopril 5 MG Tablet 1 tablet Orally Once a day    ---    * Metoprolol Succinate ER 25 MG Tablet Extended Release 24 Hour 1 tablet Orally Once a day    ---    * MiraLax - Powder as directed Orally     ---    * Oxycodone HCl 5 MG/5ML Solution 5 ml as needed Orally every 6 hrs    ---    * Milk of Magnesia 400 MG/5ML Suspension 5 ml as needed Orally Four times a day    ---    * Bisacodyl 10 MG Suppository 1 suppository as needed Rectal Once a day    ---    * Aspirin 81 MG Tablet Delayed Release 1 tablet Orally Once a day    ---    Not-Taking/PRN    * Centrum Silver Tablet as directed Orally     ---    * Hydrochlorothiazide 25 MG Tablet 1 tablet Orally Once a day    ---    * Xanax 0.25 MG Tablet 1 tablet Orally Twice a day    ---    * Hydrocodone-Acetaminophen 5-325 MG Tablet 1 tablet as needed Orally every 6 hrs    ---    * Amiodarone HCl 200 MG Tablet 1 tablet Orally Once a day    ---    * Zestril 5 MG Tablet 1 tablet Orally Once a day    ---    * Zocor 10 MG Tablet 1 tablet every evening Orally Once a day    ---    * Xarelto 15 MG Tablet Orally     ---    * Medication List reviewed and reconciled with the patient    ---      Past  Medical History    ---       prostate cancer: Dx'd 7/06; S/P XRT: 11/06-1/07: gleason's 8.        ---    GERD.        ---    Hypercholesterolemia.        ---    Hypertension.        ---    Anxiety.        ---    Atrial Fibrillation: @ 2017.        ---  ED - Impotence of organic origin.        ---    Renal insufficiency.        ---       **Surgical History**    ---       Trans Rectal [place] and Prostate BX 09/2004    ---    cholecystectomy 04/2004    ---    inguinal hernia repair    ---    cystoscopy: c/w rad changes 08/27/2016    ---    cardioversion    ---      **Family History**    ---       No Family History documented.    ---       **Social History**    ---    Alcohol: yes, occasional.    no Smoking status: never smoker, Patient counseled on the dangers of tobacco  use: 08/20/2016.    no Caffeine.      **Allergies**    ---       N.K.D.A.    ---       **Hospitalization/Major Diagnostic Procedure**    ---       fall/compressionfx c/we met disease 5/11-5/15/19    ---       **Review of Systems**    ---     _General_ :    Fatigue yes. no flank pain. no Fever. no Chills. Weakness yes.    _Cardiovascular_ :    no Chest pain.    _Gastroenterology_ :    no Constipation. no Diarrhea.    _Urology_ :    no Dysuria. no Blood in urine. no Frequent urination. no Urinary incontinence.            **Reason for Appointment**    ---       1\. Prostate cancer follow up    ---       **History of Present Illness**    ---     _Urological History_ :    pt not seen in close to year due to social issues ( pt in [place],  etc.) now returns with a very high PSA. he was recently hospitalized with a  fall and compression Fx c/w met disease: he is here with his 2 daughters. he  is voiding well.       **Vital Signs**    ---    Ht 5'7'', Wt 168, BMI 26.31, BP 130/74, Temp 97.9, RR 16.       **Examination**    ---     _General_ :    General appearance:  A&O x 3, no acute distress, pleasant. HEENT  Head:  normocephalic, head: atraumatic, no  icterus. Lungs/Respirations:  No labored  breathing, good excursion. Abdomen:  pt in a back brace; not examined.  Musculoskeletal:  as above: walks slowly with a walker. Skin:  normal turgor.    GU exam deferred.           **Assessments**    ---    1\. Prostate cancer - C61 (Primary)    ---       **Treatment**    ---       **1\. Prostate cancer**    Start Casodex Tablet, 50 MG, 1 tablet, Orally, Once a day, 30 day(s), 30,  Refills 5    _IMAGING: NM Bone Imaging Whole Body e-sch*_     PSA 185 with MRI proven  spinal mets: look at bones as a whole Scheduled at [facility] on    --- ---  Notes: I spoke with the pt and his 2 daughters and we spoke about the fact  that he has metastatic prostate cancer based on his PSA and his MRI of the  spine. We discussed about treatment with Hormonal therapy and the side  effects. We discussed about Lupron every 3 months and starting on Casodex to  prevent a flare. I also want to get a nuclear bone scan to evaluate the rest  of his bones besides his spine. We discussed about other newer antiandrogen's  such as Xtandi but may have to get PA for that so for now I want to start him  on Casodex. We also talked about an orchiectomy. all ?'s answered. he also  will be seeing Dr. Lynann Bologna ( med onc) later this week at [facility].        **2\. Others**    Notes: the pt and his daughters asked me about travel He wants to go back to  [place]. first of all, due ot his compression FX, I told him that the  orthopedist, not myself can give him permission to travel. Secondly, it was  his going to [place] last year that prevented his follow up with me ( he has no  doctor in [place]) and thus if he had followed up we might have been able to start  his hormonal therapy earlier.. I did suggest that if this si really what he  wants to do, we could wait and see if the initial Lupron seems ot help and if  it does, we can do an orchiectomy which would be permanent but that I would  still recommend  that he needs follow up. Unfortunately he is very stubborn and  was focused a lot on returning to [facility]. .        **Labs**    --- ---    _Lab: ClinitekStatus SG_    ---       GLU  Negative     \-    --- --- --- ---    BIL  Negative     \-    --- --- --- ---    KET  Negative     \-    --- --- --- ---    SG  1.015     \-    --- --- --- ---    BLO  Negative     \-    --- --- --- ---    pH  5.5     \-    --- --- --- ---    PRO  Negative     \-    --- --- --- ---    URO  2.0 E.U./dL     \-    --- --- --- ---    NIT  Negative     \-    --- --- --- ---    LEU  Negative     \-    --- --- --- ---         Sherlene Shams 08/12/2017 1:19:38 PM > Bryndle Corredor E 08/12/2017  1:39:18 PM >    --- ---       **Procedure Codes**    ---       81003 UA Automated    ---       **Follow Up**    ---    lupron injeciton wiht nurse or NP in about 2 weeks ( must ahve bone scan  before that and not earlier than 2 week)  Electronically signed by Leola Brazil , MD on 08/12/2017 at 01:49 PM EDT    Sign off status: Completed        * * [facility]    [address]    [place], [place] [id number]    Tel: [phone]    Fax: [phone]              * * *          Patient: [name], [name] DOB: 1931/09/08 Progress Note: [name].  [name], [name] 08/12/2017    ---    Note generated by eClinicalWorks EMR/PM Software ([email or web address])

## 2020-06-05 NOTE — Progress Notes (Signed)
* * *        **  [name]**    --- ---    89 Y old Male, DOB: 02-23-1932    [address] [place], [place] [address]    Home: [phone]    Provider: [name]        * * *    Telephone Encounter    ---    Answered by   [name]  Date: 12/16/2016         Time: 11:12 AM    Caller   pt    --- ---            Reason   appt tomorrow            Message                      PT called hes in [place] will not be coming to appt                 Action Taken   [name],[name] 12/16/2016 11:12:06 AM > [name],[name] 12/16/2016  11:46:55 AM > Dr Currie Paris Kalesha Irving E 12/16/2016 11:51:17 AM > for how  long and do we have an address: I may ahve to send a letter [name],[name]  12/16/2016 1:11:18 PM > records have already been sent this appt should have  been cancelled                * * *                ---          * * *          Patient: [name] DOB: June 18, 1931 Provider: [name]  12/16/2016    ---    Note generated by eClinicalWorks EMR/PM Software ([email or web address])

## 2020-06-05 NOTE — Progress Notes (Signed)
* * *        **  [name]**    --- ---    67 Y old Male, DOB: November 15, 1931    [address], [place], [place] [address]    Home: [phone]    Provider: [name]        * * *    Telephone Encounter    ---    Answered by   Dolores Frame  Date: 08/07/2016         Time: 11:02 AM    Reason   FYI only    --- ---            Message                      In ER at 3 AM today with  Gross Heme/clots                Action Taken   [facility] 08/07/2016 11:02:35 AM > lm for pt to call - he  should probably be seen if he wishes to come her - there is no insurance  listed in [facility] so might want to ask Leahy,Nancy 08/07/2016 3:05:02 PM > lm  Leahy,Nancy 08/07/2016 5:00:13 PM > lm Leahy,Nancy 08/07/2016 5:00:17 PM > can  you check on him tomorrow - old pt of RA and I think he lives alone  [facility] 08/08/2016 10:18:41 AM > pt declined appt- he was told  hematuria was secondary to infection- he will call in the future if needs to  schedule Leahy,Nancy 08/11/2016 8:34:12 AM > just an FYI on an old pt  Carolan Avedisian E 08/11/2016 2:53:20 PM > if he is back in town: i should see  him:; please call him he has prostate cancer and was radiated so hematuria may  o may not be due to a UTI.[name] Kitchen also try to see if he is living here now.. get an  address if possible Leahy,Nancy 08/11/2016 3:18:59 PM > called pt - when he  hears who I am he hangs up - address we have is same as [facility] recently -  Delmos Velaquez E 08/12/2016 8:00:22 AM > will send a letter                * * *                ---          * * *          Patient: [name], [name] DOB: 1932-01-12 Provider: [name]  08/07/2016    ---    Note generated by eClinicalWorks EMR/PM Software ([email or web address])

## 2020-06-05 NOTE — Progress Notes (Signed)
* * *        **  [name]**    --- ---    23 Y old Male, DOB: 03/20/1931    [address] [place], [place] [address]    Home: [phone]    Provider: [name]        * * *    Telephone Encounter    ---    Answered by   Jaynie Crumble  Date: 10/13/2016         Time: 11:48 AM    Reason   verify address    --- ---            Message                      and let him know that a records release has been sent to the office   and how far back are we going to send   asked for call back                 Action Taken   [name],[name] 10/13/2016 11:49:14 AM > [name],[name] 10/13/2016 4:51:55  PM > called pt again left another message [name],[name] 10/14/2016 11:11:45 AM >  spoke to pt he h as moved to [place] and is see another dr there                * * *                ---          * * *          Patient: [name] DOB: [birth year] Provider: [name]  10/13/2016    ---    Note generated by eClinicalWorks EMR/PM Software ([email or web address])

## 2020-06-05 NOTE — Progress Notes (Signed)
* * *        **[name]    --- ---    14 Y old Male, DOB: 23-Feb-1932    Account Number: [id number]    [address], [place], [place]    Home: [phone]    Guarantor: [name] Insurance: [name] Medicare Complete Payer ID: [id number]    PCP: Tyler Musa, MD Referring: Tyler Musa, MD    Appointment Facility: [facility], [facility].        * * *    08/20/2016  Progress Notes: [name]. [name], [name]    --- ---    ---        Current Medications    ---    Taking     * Centrum Silver Tablet as directed Orally     ---    * Hydrochlorothiazide 25 MG Tablet 1 tablet Orally Once a day    ---    * Omeprazole 20 MG Capsule Delayed Release 1 capsule Orally Once a day    ---    * Xanax 0.25 MG Tablet 1 tablet Orally Twice a day    ---    * Hydrocodone-Acetaminophen 5-325 MG Tablet 1 tablet as needed Orally every 6 hrs    ---    * Amiodarone HCl 200 MG Tablet 1 tablet Orally Once a day    ---    * Zestril 5 MG Tablet 1 tablet Orally Once a day    ---    Not-Taking/PRN    * Zocor 10 MG Tablet 1 tablet every evening Orally Once a day    ---    * Aspirin 81 MG Tablet Delayed Release 1 tablet Orally Once a day    ---    * Xarelto 15 MG Tablet Orally     ---    * Medication List reviewed and reconciled with the patient    ---      Past Medical History    ---       prostate cancer: Dx'd 7/06; S/P XRT: 11/06-1/07: gleason's 8.        ---    GERD.        ---    Hypercholesterolemia.        ---    Hypertension.        ---    Anxiety.        ---    Atrial Fibrillation: @ 2017.        ---    ED - Impotence of organic origin.        ---    Renal insufficiency.        ---      Surgical History    ---      Trans Rectal [place] and Prostate BX 09/2004    ---    cholecystectomy 04/2004    ---    inguinal hernia repair    ---    cardioversion    ---      Family History    ---      No Family History documented.    ---      Social History    ---    Alcohol: yes, occasional.    no Smoking status: never smoker,  Patient counseled on the dangers of tobacco  use: 08/20/2016.    no Caffeine.      Allergies    ---      N.K.D.A.    ---  Review of Systems    ---     _General_ :    no Chills. no Weakness. no Abdominal pain.    _Gastroenterology_ :    no Constipation. no Diarrhea.    _Urology_ :    Blood in urine yes. no Frequent urination. no Urinary incontinence.            Reason for Appointment    ---      1\. Gross Hematuria/Clots ; prostate cancer    ---      History of Present Illness    ---     _Urological History_ :    pt here for the first time in 5 years: pt noticed some blood in his urine: did  clear up with antibiotics; then returned and no true infection: good flow, ?  empties, nocturia x 1; no recent urinary issues until now was on xarelto and  stopped 3 days ago and a baby asa.      Vital Signs    ---    Ht 5'7", Wt 168, BMI 26.31, BP 152/86, Temp 97.4.      Examination    ---     _General_ :    Lungs/Respirations: No labored breathing, good excursion. Musculoskeletal:  normal gait.    _Genitourinary - Male_ :    General appearance: alert, oriented, build: average, no apparent distress,  pleasant. Abdomen: soft, non-tender, no mass, no cvat, non-distended.  Anus/Perineum: anus was intact, no lesions, no rashes, no fissures/fistulas.  Digital Rectal Exam (DRE): normal, no masses. Epididymis: normal, non-tender,  bilateral. Penis normal, no rashes, no phimosis. Prostate flat, firm, non-  tender. Scrotum: normal, no rashes, no lesions. Sphincter tone: normal.  Testicles: not enlarged. non-tender, normal bilaterally. Urethral meatus:  normal, no discharge. HEENT: Head: normocephalic, Head: atraumatic, Sclera:  anicteric. Lymphatic: no inguinal adenopathy noted. Skin: good turgor.          Assessments    ---    1\. Prostate cancer - C61 (Primary)    ---    2\. Gross hematuria - R31.0    ---    3\. Atrial fibrillation, unspecified type - I48.91    ---    4\. Essential hypertension - I10    ---    5\.  Hypercholesteremia - E78.00    ---    6\. Gastroesophageal reflux disease, esophagitis presence not specified -  K21.9    ---    7\. Anxiety - F41.9    ---    8\. Renal insufficiency - N28.9    ---      Treatment    ---       **1\. Prostate cancer**    Notes: I discussed this issue with the pt and his wife at length. although his  PSA could be somewhat falsely elevated by an acute LUT issue now, the main  concern is that as I have told him in the past that he is not cured of hsi  prostate cancer and needs routine GU follow up and that he is 11 years after  radiation for a high grade tumor and that this is not unusual and that we may  need to start hormonal therapy on him once i have worked up his hematuria. He  may need a nuclear one scan at some point in time as well.    ---        **2\. Gross hematuria**    _LAB: Urine Cytology_     Marcaurelle,[name] 08/20/2016 9:11:58 AM >  [name] 08/20/2016 9:19:50 AM >    --- ---        Notes: discussed with pt at length about a hematuria workup using pictures. Pt  needs an upper tract study followed by an office cystoscopy. he had a CT (  with no contrast due to his renal insuff) that showed no obvious GU pathology  of his upper tracts but that he needs a cystoscopy and I explained my concerns  abut bladder cancer and radiation cystitis etc.        **3\. Atrial fibrillation, unspecified type**    Notes: the ED told him to hold his xarelto and baby asa and i told then to  contact Dr. Wyline [name].        **4\. Others**    Notes: Patient Educated with: [name].pdf [name], materials.pdf).    Clinical Notes: I spent extra time with them and emphasized the importance of  close medical follow up: for his prostate cancer with me and for his cardiac  issues with Dr. Gwinda [name]. He travels between here and [place] and I  strongly suggested to them that he get Docs down there as well for routine  follow ups. His wife became emotional because I do not think she realized  this  and I made sure that the pt understood what I had said about routine GU follow  up.        Labs    --- ---    [name]: Urine Culture, Routine_    ---    _Lab: ClinitekStatus SG_       Color  red       --- --- --- ---    GLU  negative       --- --- --- ---    BIL  negative       --- --- --- ---    KET  negative       --- --- --- ---    SG  1.015       --- --- --- ---    BLO  large       --- --- --- ---    pH  5.0       --- --- --- ---    PRO  trace       --- --- --- ---    URO  0.2       --- --- --- ---    NIT  negative       --- --- --- ---    LEU  large       --- --- --- ---         [facility] 08/20/2016 8:40:33 AM > Camaria Gerald [name] 08/20/2016  8:53:42 AM >    --- ---    [name]: Urine Microscopy_       WBC  5-10       --- --- --- ---    RBC  30-40       --- --- --- ---    Epithelial  0       --- --- --- ---    Bacteria  trace       --- --- --- ---    Amorphous  trace       --- --- --- ---    Mucous  trace       --- --- --- ---    Casts  0       --- --- --- ---         Sherlene Shams 08/20/2016  9:10:05 AM > Omair Dettmer [name] 17-Sep-2016  9:19:40 AM >    --- ---    [name]: PSA, total_ 23.8 ng/ml       level  23.8 ng/ml       --- --- --- ---         Sherlene Shams 17-Sep-2016 9:09:25 AM > Kody Vigil [name] Sep 17, 2016  9:13:05 AM > Bradden Tadros [name] 09/17/2016 9:19:45 AM >    --- ---      Preventive Medicine    ---      Counseling: Blood pressure management Patient encouraged to adhere to blood  pressure testing and management per Primary care physician 2016-09-17.    ---      Procedure Codes    ---      81003 UA Automated    ---    69629 UCCC    ---    81015 MICROSCOPIC EXAM OF URINE    ---    52841 PSA    ---      Follow Up    ---    cystoscopy    Electronically signed by Leola Brazil , MD on 09/17/16 at 12:11 PM EDT    Sign off status: Completed        * * [facility]    [address]    [place], [place] [id number]    Tel: [phone]    Fax: [phone]              * *  *          Patient: [name], [name] DOB: January 25, 1932 Progress Note: [name]. [name],  [name] 09-17-2016    ---    Note generated by eClinicalWorks EMR/PM Software ([email or web address])

## 2020-06-13 ENCOUNTER — Ambulatory Visit: Payer: Medicare HMO | Attending: Internal Medicine

## 2020-06-13 ENCOUNTER — Other Ambulatory Visit: Payer: Self-pay

## 2020-06-13 DIAGNOSIS — Z23 Encounter for immunization: Secondary | ICD-10-CM

## 2020-06-13 NOTE — Progress Notes (Signed)
   [id number]  Name:  Cam Dauphin.    MRN: [id number] DOB: 05/16/1931  06/13/2020  Mr. Lorusso was observed post Covid-19 immunization for 15 minutes without incident. He was provided with Vaccine Information Sheet and instruction to access the V-Safe system.   Mr. Tsuchiya was instructed to call 911 with any severe reactions post vaccine: [name] Kitchen Difficulty breathing  . Swelling of face and throat  . A fast heartbeat  . A bad rash all over body  . Dizziness and weakness   Immunizations Administered    Name Date Dose VIS Date Route   PFIZER Comrnaty(Gray TOP) Covid-19 Vaccine 06/13/2020 12:54 PM 0.3 mL 02/16/2020 Intramuscular   Manufacturer: [employer]   Lot: [id number]   [place]: [phone]

## 2020-06-21 ENCOUNTER — Other Ambulatory Visit (HOSPITAL_BASED_OUTPATIENT_CLINIC_OR_DEPARTMENT_OTHER): Payer: Self-pay

## 2020-06-21 MED ORDER — COVID-19 MRNA VACCINE (PFIZER) 30 MCG/0.3ML IM SUSP
INTRAMUSCULAR | 0 refills | Status: AC
  Filled 2020-06-21: qty 0.3, 1d supply, fill #0

## 2020-06-28 ENCOUNTER — Encounter (HOSPITAL_BASED_OUTPATIENT_CLINIC_OR_DEPARTMENT_OTHER): Admitting: Adult Health

## 2020-07-12 ENCOUNTER — Encounter (HOSPITAL_BASED_OUTPATIENT_CLINIC_OR_DEPARTMENT_OTHER): Admitting: Internal Medicine

## 2020-07-24 ENCOUNTER — Telehealth (INDEPENDENT_AMBULATORY_CARE_PROVIDER_SITE_OTHER): Admitting: Urology

## 2020-07-24 NOTE — Telephone Encounter (Signed)
Left message for patient to call and schedule appointment. Advise to call if they have any questions or concerns

## 2020-07-24 NOTE — Telephone Encounter (Signed)
Needs his medication filled for hot flashes she was not sure what it is called - he is out

## 2020-07-25 ENCOUNTER — Ambulatory Visit: Attending: Family | Admitting: Family

## 2020-07-25 ENCOUNTER — Other Ambulatory Visit (HOSPITAL_BASED_OUTPATIENT_CLINIC_OR_DEPARTMENT_OTHER): Admitting: Family

## 2020-07-25 DIAGNOSIS — C7951 Secondary malignant neoplasm of bone: Secondary | ICD-10-CM | POA: Diagnosis not present

## 2020-07-25 DIAGNOSIS — M47816 Spondylosis without myelopathy or radiculopathy, lumbar region: Secondary | ICD-10-CM | POA: Diagnosis not present

## 2020-07-25 DIAGNOSIS — I119 Hypertensive heart disease without heart failure: Secondary | ICD-10-CM | POA: Diagnosis not present

## 2020-07-25 DIAGNOSIS — I129 Hypertensive chronic kidney disease with stage 1 through stage 4 chronic kidney disease, or unspecified chronic kidney disease: Secondary | ICD-10-CM | POA: Diagnosis not present

## 2020-07-25 DIAGNOSIS — N1832 Chronic kidney disease, stage 3b: Secondary | ICD-10-CM | POA: Diagnosis not present

## 2020-07-25 LAB — CBC WITH DIFFERENTIAL
Basophils %: 0.4 %
Basophils Absolute: 0.02 10*3/uL (ref 0.00–0.22)
Eosinophils %: 3.7 %
Eosinophils Absolute: 0.17 10*3/uL (ref 0.00–0.50)
Hematocrit: 35.6 % — ABNORMAL LOW (ref 37.0–53.0)
Hemoglobin: 11.8 g/dL — ABNORMAL LOW (ref 13.0–17.5)
Immature Granulocytes %: 0.2 %
Immature Granulocytes Absolute: 0.01 10*3/uL (ref 0.00–0.10)
Lymphocyte %: 34.6 %
Lymphocytes Absolute: 1.59 10*3/uL (ref 0.70–4.00)
MCH: 34.6 pg — ABNORMAL HIGH (ref 26.0–34.0)
MCHC: 33.1 g/dL (ref 31.0–37.0)
MCV: 104.4 fL — ABNORMAL HIGH (ref 80.0–100.0)
MPV: 10.6 fL (ref 9.1–12.4)
Monocytes %: 8.9 %
Monocytes Absolute: 0.41 10*3/uL (ref 0.38–0.83)
NRBC %: 0 % (ref 0.0–0.0)
NRBC Absolute: 0 10*3/uL (ref 0.00–2.00)
Neutrophil %: 52.2 %
Neutrophils Absolute: 2.39 10*3/uL (ref 1.50–7.95)
Platelets: 153 10*3/uL (ref 150–400)
RBC: 3.41 M/uL — ABNORMAL LOW (ref 4.20–5.90)
RDW-CV: 13.6 % (ref 11.5–14.5)
RDW-SD: 51.7 fL — ABNORMAL HIGH (ref 35.0–51.0)
WBC: 4.6 10*3/uL (ref 4.0–11.0)

## 2020-07-25 LAB — BASIC METABOLIC PANEL
Anion Gap: 9 mmol/L (ref 3–14)
BUN: 22 mg/dL (ref 6–24)
CO2 (Bicarbonate): 23 mmol/L (ref 20–32)
Calcium: 9.6 mg/dL (ref 8.5–10.5)
Chloride: 108 mmol/L (ref 98–110)
Creatinine: 1.46 mg/dL — ABNORMAL HIGH (ref 0.55–1.30)
Glucose: 132 mg/dL — ABNORMAL HIGH (ref 70–110)
Potassium: 3.9 mmol/L (ref 3.6–5.2)
Sodium: 140 mmol/L (ref 135–146)
eGFRcr: 47 mL/min/{1.73_m2} — ABNORMAL LOW (ref >=60)

## 2020-08-07 DIAGNOSIS — H53123 Transient visual loss, bilateral: Secondary | ICD-10-CM | POA: Diagnosis not present

## 2020-08-07 DIAGNOSIS — H353131 Nonexudative age-related macular degeneration, bilateral, early dry stage: Secondary | ICD-10-CM | POA: Diagnosis not present

## 2020-08-09 ENCOUNTER — Other Ambulatory Visit (HOSPITAL_BASED_OUTPATIENT_CLINIC_OR_DEPARTMENT_OTHER): Admitting: Family

## 2020-08-17 ENCOUNTER — Inpatient Hospital Stay: Payer: Medicare HMO

## 2020-08-17 ENCOUNTER — Inpatient Hospital Stay: Payer: Medicare HMO | Admitting: Oncology

## 2020-08-19 ENCOUNTER — Inpatient Hospital Stay
Admit: 2020-08-19 | Disposition: A | Discharge status: Home / Self Care | Payer: MEDICARE | Attending: Physician Assistant | Primary: Internal Medicine

## 2020-08-19 ENCOUNTER — Other Ambulatory Visit

## 2020-08-19 ENCOUNTER — Encounter (INDEPENDENT_AMBULATORY_CARE_PROVIDER_SITE_OTHER)

## 2020-08-19 VITALS — BP 126/79 | HR 75 | Temp 99.2°F | Resp 18 | Ht 67.0 in | Wt 170.0 lb

## 2020-08-19 DIAGNOSIS — U071 COVID-19: Secondary | ICD-10-CM

## 2020-08-19 DIAGNOSIS — Z1159 Encounter for screening for other viral diseases: Secondary | ICD-10-CM | POA: Diagnosis not present

## 2020-08-19 LAB — POCT SARS-COV-2/FLU A&B
POCT Flu A: NOT DETECTED
POCT Flu B: NOT DETECTED
POCT SARS-CoV-2: DETECTED — AB

## 2020-08-19 LAB — POCT STREP A PCR: POCT Strep A PCR: NOT DETECTED

## 2020-08-19 MED ORDER — nirmatrelvir-ritonavir 150 mg x 2- 100 mg tablet
300 | ORAL_TABLET | Freq: Two times a day (BID) | ORAL | 0 refills | 5.00000 days | Status: AC
Start: 2020-08-19 — End: 2020-08-24

## 2020-08-19 NOTE — ED Provider Notes (Signed)
History  Chief Complaint   Patient presents with   ? Earache   ? Sore Throat     C/o sore throat & ear pain x 2 days   ,  87 year old male with history of prostate cancer, presents to urgent care today with symptoms of a sore throat and left earache that started 2 days ago.  Patient denies any known sick contacts.  He is currently COVID vaccinated.  He has not used anything over-the-counter to alleviate his symptoms.  He denies any fevers, cough, shortness of breath, chills, body aches, fatigue.          History reviewed. No pertinent past medical history.    History reviewed. No pertinent surgical history.    No family history on file.    Social History     Tobacco Use   ? Smoking status: Former Smoker     Quit date: 1962     Years since quitting: 60.4   ? Smokeless tobacco: Never Used   Substance Use Topics   ? Alcohol use: Not on file   ? Drug use: Not on file       Review of Systems   Constitutional: Negative for appetite change, chills, fatigue and fever.   HENT: Positive for ear pain (Left) and sore throat. Negative for congestion, rhinorrhea, sinus pressure and sinus pain.    Eyes: Negative for pain and visual disturbance.   Respiratory: Negative for cough and shortness of breath.    Cardiovascular: Negative for chest pain and palpitations.   Gastrointestinal: Negative for abdominal pain, diarrhea, nausea and vomiting.   Genitourinary: Negative for dysuria and hematuria.   Musculoskeletal: Negative for arthralgias and back pain.   Skin: Negative for color change and rash.   Neurological: Negative for headaches.   All other systems reviewed and are negative.      Physical Exam  ED Triage Vitals [08/19/20 1234]   Temp Pulse Resp BP   37.3 ?C (99.2 ?F) 75 18 126/79      SpO2 Temp Source Heart Rate Source Patient Position   97 % Oral Monitor Sitting      BP Location FiO2 (%)     Left arm --         Physical Exam  Vitals and nursing note reviewed.   Constitutional:       General: He is not in acute distress.      Appearance: Normal appearance. He is well-developed. He is not ill-appearing.   HENT:      Head: Normocephalic and atraumatic.      Right Ear: Tympanic membrane, ear canal and external ear normal.      Left Ear: Tympanic membrane, ear canal and external ear normal.      Mouth/Throat:      Mouth: Mucous membranes are moist.      Pharynx: Oropharynx is clear. No oropharyngeal exudate or posterior oropharyngeal erythema.   Eyes:      Conjunctiva/sclera: Conjunctivae normal.   Cardiovascular:      Rate and Rhythm: Normal rate and regular rhythm.      Heart sounds: Normal heart sounds.   Pulmonary:      Effort: Pulmonary effort is normal. No respiratory distress.      Breath sounds: Normal breath sounds. No stridor. No wheezing, rhonchi or rales.   Musculoskeletal:         General: Normal range of motion.      Cervical back: Normal range of motion and neck supple.  Skin:     General: Skin is warm and dry.   Neurological:      General: No focal deficit present.      Mental Status: He is alert and oriented to person, place, and time. Mental status is at baseline.              No orders to display     Labs Reviewed   POCT SARS-COV-2/FLU A&B - Abnormal       Result Value    POCT SARS-CoV-2 Detected (*)     POCT Flu A Not Detected      POCT Flu B Not Detected      Narrative:     This test was performed with the [employer] SARS-COV-2 & Influenza A/B RNA by RT PCR molecular test. Not detected results do not rule out infection with COVID-19, Influenza A and/or Influenza B. This test is offered under Emergency use Authorization by the FDA.   POCT STREP A PCR - Normal    POCT Strep A PCR Not Detected         Procedures  Procedures    UC Course  Diagnoses as of 08/19/20 1309   COVID-19       MDM  Number of Diagnoses or Management Options  COVID-19: new and requires workup  Diagnosis management comments: Patient positive for COVID-19 today.  Negative for influenza A/B.  Vital signs stable.  Physical exam without signs of infection.   Discussed quarantine guidelines per CDC protocol.  Patient does meet criteria for Paxlovid antiviral treatment.  Rx sent to pharmacy with renal dosing.  Recommended plenty fluids and rest.  Patient to take over-the-counter Tylenol as needed for additional effects.  Recommended salt water gargles, throat lozenges, Chloraseptic throat sprays, tea with honey and lemon for additional effects.  Patient to contact his PCP if any further questions arise.      Discharge Meds  ED Prescriptions     Medication Sig Dispense Start Date End Date Auth. Provider    nirmatrelvir-ritonavir 150 mg x 2- 100 mg tablet Take 3 tablets (1 Dose pack) by mouth in the morning and at bedtime for 5 days. GFR 45 -please renally dose Rx 30 tablet 08/19/2020 08/24/2020 Ernie Avena, PA          Home Meds  Prior to Admission medications    Medication Sig Start Date End Date Taking? Authorizing Provider   furosemide (Lasix) 40 mg tablet Take by mouth. 12/11/19   Nurse [facility], RN   gabapentin (Neurontin) 600 mg tablet Take 600 mg by mouth if needed in the morning, at noon, and at bedtime. 05/31/20   Nurse [facility], RN   labetalol (Normodyne) 100 mg tablet Take 0.5 tablets by mouth in the morning and at bedtime. 05/02/20   Nurse [facility], RN   losartan (Cozaar) 50 mg tablet Take 50 mg by mouth in the morning. 07/25/20   Nurse [facility], RN   omeprazole (PriLOSEC) 20 mg DR capsule Take 20 mg by mouth in the morning. 04/16/20   Nurse [facility], RN         Total amount of time spent on day of service doing chart review, history and physical exam,? order/ review results of testing ordered (if any), patient counseling, documentation: 30 minutes.      Patient encounter note may have been created using voice recognition software and in real time during the office visit. Please excuse any typographical errors that may not have  been edited out.               [place], [place]  08/19/20 1309

## 2020-08-23 ENCOUNTER — Ambulatory Visit: Payer: Medicare HMO

## 2020-08-23 ENCOUNTER — Other Ambulatory Visit: Payer: Medicare HMO

## 2020-08-23 ENCOUNTER — Telehealth: Payer: Self-pay | Admitting: *Deleted

## 2020-08-23 ENCOUNTER — Ambulatory Visit: Payer: Medicare HMO | Admitting: Oncology

## 2020-08-23 NOTE — Telephone Encounter (Signed)
Patient missed appts 6/16 for lab/Dr. Shadad/Injection Contacted patient -"mailbox full"- unable to leave message.

## 2020-09-03 ENCOUNTER — Other Ambulatory Visit: Admit: 2020-09-03 | Payer: MEDICARE | Primary: Internal Medicine

## 2020-09-03 ENCOUNTER — Other Ambulatory Visit

## 2020-09-03 DIAGNOSIS — C61 Malignant neoplasm of prostate: Secondary | ICD-10-CM

## 2020-09-03 DIAGNOSIS — D649 Anemia, unspecified: Secondary | ICD-10-CM | POA: Diagnosis not present

## 2020-09-03 DIAGNOSIS — R739 Hyperglycemia, unspecified: Secondary | ICD-10-CM | POA: Diagnosis not present

## 2020-09-03 LAB — CBC WITH DIFFERENTIAL
Basophils %: 0.6 %
Basophils Absolute: 0.03 10*3/uL (ref 0.00–0.22)
Eosinophils %: 4.6 %
Eosinophils Absolute: 0.23 10*3/uL (ref 0.00–0.50)
Hematocrit: 36.1 % — ABNORMAL LOW (ref 37.0–53.0)
Hemoglobin: 12.5 g/dL — ABNORMAL LOW (ref 13.0–17.5)
Immature Granulocytes %: 0.2 %
Immature Granulocytes Absolute: 0.01 10*3/uL (ref 0.00–0.10)
Lymphocyte %: 37.6 %
Lymphocytes Absolute: 1.87 10*3/uL (ref 0.70–4.00)
MCH: 34.9 pg — ABNORMAL HIGH (ref 26.0–34.0)
MCHC: 34.6 g/dL (ref 31.0–37.0)
MCV: 100.8 fL — ABNORMAL HIGH (ref 80.0–100.0)
MPV: 10.2 fL (ref 9.1–12.4)
Monocytes %: 13.1 %
Monocytes Absolute: 0.65 10*3/uL (ref 0.38–0.83)
NRBC %: 0 % (ref 0.0–0.0)
NRBC Absolute: 0 10*3/uL (ref 0.00–2.00)
Neutrophil %: 43.9 %
Neutrophils Absolute: 2.18 10*3/uL (ref 1.50–7.95)
Platelets: 160 10*3/uL (ref 150–400)
RBC: 3.58 M/uL — ABNORMAL LOW (ref 4.20–5.90)
RDW-CV: 13 % (ref 11.5–14.5)
RDW-SD: 47.4 fL (ref 35.0–51.0)
WBC: 5 10*3/uL (ref 4.0–11.0)

## 2020-09-03 LAB — HEMOGLOBIN A1C
Estimated Average Glucose mg/dL (INT/EXT): 111 mg/dL
HEMOGLOBIN A1C % (INT/EXT): 5.5 % (ref <5.6)

## 2020-09-03 LAB — IRON, FERRITIN, TIBC
Ferritin: 223.9 ng/mL (ref 22.0–336.0)
Iron Saturation: 27 % (ref 10–50)
Iron: 68 ug/dL (ref 30–180)
TIBC: 253 ug/dL (ref 250–463)

## 2020-09-03 LAB — PSA TOTAL, DIAGNOSTIC: PSA: 3.17 ng/mL (ref 0.00–4.00)

## 2020-09-05 ENCOUNTER — Other Ambulatory Visit

## 2020-09-05 ENCOUNTER — Ambulatory Visit: Admit: 2020-09-05 | Discharge: 2020-09-05 | Payer: MEDICARE | Attending: Internal Medicine | Primary: Internal Medicine

## 2020-09-05 ENCOUNTER — Encounter (HOSPITAL_BASED_OUTPATIENT_CLINIC_OR_DEPARTMENT_OTHER): Admitting: Internal Medicine

## 2020-09-05 VITALS — BP 125/71 | HR 97 | Temp 97.2°F | Ht 67.0 in | Wt 174.8 lb

## 2020-09-05 DIAGNOSIS — C61 Malignant neoplasm of prostate: Secondary | ICD-10-CM

## 2020-09-05 DIAGNOSIS — Z79899 Other long term (current) drug therapy: Secondary | ICD-10-CM | POA: Diagnosis not present

## 2020-09-05 DIAGNOSIS — G893 Neoplasm related pain (acute) (chronic): Secondary | ICD-10-CM | POA: Diagnosis not present

## 2020-09-05 DIAGNOSIS — G622 Polyneuropathy due to other toxic agents: Secondary | ICD-10-CM | POA: Diagnosis not present

## 2020-09-05 DIAGNOSIS — Z79891 Long term (current) use of opiate analgesic: Secondary | ICD-10-CM | POA: Diagnosis not present

## 2020-09-05 DIAGNOSIS — C7951 Secondary malignant neoplasm of bone: Secondary | ICD-10-CM | POA: Diagnosis not present

## 2020-09-05 DIAGNOSIS — Z79818 Long term (current) use of other agents affecting estrogen receptors and estrogen levels: Secondary | ICD-10-CM | POA: Diagnosis not present

## 2020-09-05 DIAGNOSIS — Z7982 Long term (current) use of aspirin: Secondary | ICD-10-CM | POA: Diagnosis not present

## 2020-09-05 DIAGNOSIS — Z981 Arthrodesis status: Secondary | ICD-10-CM | POA: Diagnosis not present

## 2020-09-05 DIAGNOSIS — G629 Polyneuropathy, unspecified: Secondary | ICD-10-CM | POA: Diagnosis not present

## 2020-09-05 NOTE — Addendum Note (Signed)
 Addended by: Micheline Maze on: 10/17/2020 09:44 AM     Modules accepted: Orders

## 2020-09-05 NOTE — Progress Notes (Addendum)
Patient ID: [name]. is a 87 y.o. male with metastatic prostate cancer.  Primary Care Provider: Perfecto Kingdom, MD      Subjective   ONCOLOGIC HISTORY: He was admitted to the hospital s/p fall down the stairs, resulting in a back injury.  Extensive work-up including CT head, CT spine and MRI spine was done.  It revealed evidence of metastatic disease to the back and a mild compression fracture of L2.  There was no evidence of spinal cord compression.  PSA was checked and it returned high at 245.  He was treated conservatively for his fracture and was started on pain meds.  He was then started on Lupron and Casodex, as well as Xgeva. Casodex was then discontinued and he remained on the other two. For his bony met in the back, he underwent kyphoplasty. Repeat PSAs had been low but in early 2021, PSA was noted to have increased to 7.1 in [place]. Restaging scans showed no visceral disease but worsening bony mets. Thus, in March 2021, he was started on Xtandi (reduced dose at 80 mg daily and then 40 mg daily due to loose stools). He moved back to the [place] area in the summer.  Then, in Aug 2021, he left for [facility] again.        INTERVAL HISTORY: Since his last visit here in Aug 2021, he has been living in [place]. He states that he has been off [place]. He does recall getting a Lupron shot at some point but cannot remember when exactly.    Currently feels okay. Endorses low back pain for which he was previously on Fentanyl patch and then Oxycontin, but is no longer on any of that. He does have neuropathy for which he is on gabapentin TID.      Review of Systems:  A detailed and complete review of systems with emphasis on today?s chief complaint was performed, and all other systems were negative other than as denoted elsewhere in this note.      No Known Allergies     Current Outpatient Medications:   ?  aspirin 81 mg capsule, Take 81 mg by mouth in the morning., Disp: ,  Rfl:   ?  calcium carbonate-vitamin D3 500 mg-5 mcg (200 unit) tablet, Take 1 tablet by mouth in the morning., Disp: , Rfl:   ?  cyanocobalamin, vitamin B-12, (VITAMIN B-12 ORAL), 1 (one) time each day., Disp: , Rfl:   ?  furosemide (Lasix) 40 mg tablet, Take by mouth., Disp: , Rfl:   ?  gabapentin (Neurontin) 600 mg tablet, Take 600 mg by mouth if needed in the morning, at noon, and at bedtime., Disp: , Rfl:   ?  labetalol (Normodyne) 100 mg tablet, Take 0.5 tablets by mouth in the morning and at bedtime., Disp: , Rfl:   ?  losartan (Cozaar) 50 mg tablet, Take 50 mg by mouth in the morning., Disp: , Rfl:   ?  omega-3 (Fish Oil) 300-1,000 mg capsule, Take 1,000 mg by mouth 3 (three) times a week., Disp: , Rfl:   ?  omeprazole (PriLOSEC) 20 mg DR capsule, Take 20 mg by mouth in the morning., Disp: , Rfl:         Objective    Physical Examination:   Performance Status: Symptomatic; in bed <50% of the day  Blood pressure 125/71, pulse 97, temperature 36.2 ?C (97.2 ?F), temperature source Temporal, height 1.702 m, weight 79.3 kg, SpO2 97 %.,  Elnoria Howard Pain Score: 7, Body surface area is 1.94 meters squared. body mass index is 27.38 kg/m?[name] Kitchen     Constitutional: Well-appearing elderly male, not in acute distress  HEENT: Oropharynx normal, trachea midline, neck supple, no masses.   Lymphatics: No cervical, supraclavicular, axillary or groin adenopathy.   Respiratory: Lungs clear to auscultation and percussion bilaterally.   CV: Regular rate and rhythm, no murmurs, rubs, or crackles.  GI: Soft, nontender, without masses or organomegaly; normal bowel sounds.  Musculoskeletal: Walks with a cane.  Extremities: No edema, clubbing, or signs of VTE.  Neuro: Alert and oriented; no motor asymmetry or gait disturbance.  Skin: No rash or ecchymoses.    Labs:  CBC:  Lab Results   Component Value Date    WBC 5.0 09/03/2020    HGB 12.5 (L) 09/03/2020    HCT 36.1 (L) 09/03/2020    PLT 160 09/03/2020    LYMPHOPCT 37.6 09/03/2020    MONOPCT  13.1 09/03/2020    EOSPCT 4.6 09/03/2020     Chemistry:  Lab Results   Component Value Date    NA 140 07/25/2020    K 3.9 07/25/2020    CL 108 07/25/2020    CO2 23 07/25/2020    BUN 22 07/25/2020    CREATININE 1.46 (H) 07/25/2020    CALCIUM 9.6 07/25/2020    GLUCOSE 132 (H) 07/25/2020       Lab Results   Component Value Date    IRON 68 09/03/2020    FERRITIN 223.9 09/03/2020    TIBC 253 09/03/2020     Tumor Markers:  Lab Results   Component Value Date    PSA 3.17 09/03/2020                 ASSESSMENT/PLAN:  Cancer Staging  No matching staging information was found for the patient.    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 Was previously on [place] but those have been lost to follow up over the last few months, while in [place].    He is doing okay overall. I will obtain his records from [facility] and resume Lupron and Xgeva for him. Recent PSA is overall low, which is reassuring.  Will continue to trend PSA.     For his back pain, I encouraged him to take Tylenol 1000 mng TID for now. I will refer him to Palliative Care for further management. Will consider imaging but will first review records from [facility].    For his neuropathy, I encouraged him to continue gabapentin TID. I will refer him to PT as well.     Patient is agreeable to plan.  He will return for follow-up in 6 weeks.   He knows to call with questions/concerns anytime.      09/27/20: Reviewed records from [place]. Appears that he was on dose reduced Xtandi (80 mg BID), Lupron and Xgeva there, last dose  was in  Feb 2022. He was lost to follow up after that. Will arrange for all these to restart here soon. Risks and benefits of Xtandi were reviewed again and patient is agreeable to proceed. Will arrange for a chemo teach session prior to starting.

## 2020-09-05 NOTE — Addendum Note (Signed)
 Addended by: Satchel Heidinger on: 09/27/2020 04:24 PM     Modules accepted: Orders

## 2020-09-06 ENCOUNTER — Encounter (HOSPITAL_BASED_OUTPATIENT_CLINIC_OR_DEPARTMENT_OTHER)

## 2020-09-14 ENCOUNTER — Encounter: Payer: Medicare Other | Admitting: Internal Medicine

## 2020-09-17 ENCOUNTER — Other Ambulatory Visit: Payer: Self-pay | Admitting: Internal Medicine

## 2020-09-17 DIAGNOSIS — K219 Gastro-esophageal reflux disease without esophagitis: Secondary | ICD-10-CM

## 2020-09-17 MED ORDER — OMEPRAZOLE 20 MG PO CPDR: DELAYED_RELEASE_CAPSULE | ORAL | 1 refills | Status: DC

## 2020-09-25 ENCOUNTER — Other Ambulatory Visit

## 2020-09-25 ENCOUNTER — Ambulatory Visit: Payer: MEDICARE | Attending: Nurse Practitioner | Primary: Internal Medicine

## 2020-09-25 ENCOUNTER — Other Ambulatory Visit: Admit: 2020-09-25 | Payer: MEDICARE | Primary: Internal Medicine

## 2020-09-25 DIAGNOSIS — C61 Malignant neoplasm of prostate: Secondary | ICD-10-CM

## 2020-09-25 DIAGNOSIS — Z8589 Personal history of malignant neoplasm of other organs and systems: Secondary | ICD-10-CM | POA: Diagnosis not present

## 2020-09-25 LAB — MAGNESIUM: Magnesium: 2.2 mg/dL (ref 1.6–2.6)

## 2020-09-25 LAB — CREATININE, SERUM
Creatinine: 1.39 mg/dL — ABNORMAL HIGH (ref 0.55–1.30)
eGFRcr: 49 mL/min/{1.73_m2} — ABNORMAL LOW (ref >=60)

## 2020-09-25 LAB — ALBUMIN: Albumin: 3.7 g/dL (ref 3.2–5.0)

## 2020-09-25 LAB — CALCIUM, TOTAL: Calcium: 9.1 mg/dL (ref 8.5–10.5)

## 2020-09-25 NOTE — Progress Notes (Deleted)
 Palliative Care Visit Note  Palliative care consulted for symptom management, support in the setting of an advanced illness, goals of care discussion. Pt is a 87 y.o.  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       Pain: {denies default vs explaintion:35675} For his back pain, I encouraged him to take Tylenol 1000 mng TID for now. I will refer him to Palliative Care for further management. Will consider imaging but will first review records from [facility].  ?  For his neuropathy, I encouraged him to continue gabapentin TID. Getting Gapapentin from PCPs    Breathing:  {without difficulty default:35676}     Appetite:  {appetite - good,fair,poor:35674::"Good"}    Fatigue: {denies default vs explaintion:35675}    Insomnia: {denies default vs explaintion:35675}  Nausea: {denies default vs explaintion:35675}  Vomiting: {denies default vs explaintion:35675}    Constipation/Diarrhea: {denies default vs explaintion:35675}  Urination:  {without difficulty default:35676}     Anxiety: {denies default vs explaintion:35675}    Depression: {denies default vs explaintion:35675}    Goals of care: ***    Palliative Care Social History:  Social History: {Palliative Care Social History:21412}    Functional Assessment: PPS Score:  ***     PCP: Perfecto Kingdom, MD    HCP: ***  MOLST: *** none on file      PMH: Anxiety, Arthritis, Atrial flutter, Back pain, GERD - Gastro-esophageal reflux disease, HTN, Hyperlipidemia, Metastatix Prostate cancer, SOB - Shortness of breath      No past surgical history on file.    No family history on file.    Social History     Tobacco Use   ? Smoking status: Former Smoker     Quit date: 1962     Years since quitting: 60.5   ? Smokeless tobacco: Never Used   Substance Use Topics   ? Alcohol use: Not on file   ? Drug use: Not on file         Review of Systems negative except as described above    Current Outpatient Medications   Medication Instructions   ? aspirin 81 mg,  oral, Daily RT   ? calcium carbonate-vitamin D3 500 mg-5 mcg (200 unit) tablet 1 tablet, oral, Daily   ? cyanocobalamin, vitamin B-12, (VITAMIN B-12 ORAL) Daily   ? furosemide (Lasix) 40 mg tablet oral   ? gabapentin (NEURONTIN) 600 mg, oral, 3 times daily PRN   ? labetalol (Normodyne) 100 mg tablet 0.5 tablets, oral, 2 times daily   ? losartan (COZAAR) 50 mg, oral, Daily   ? omega-3 (Fish Oil) 300-1,000 mg capsule 1,000 mg, oral, 3 times weekly   ? omeprazole (PRILOSEC) 20 mg, oral, Daily       No Known Allergies      Physical Exam    Physcial Exam:    General: *** appearing *** in NAD.  HEENT:  head atraumatic, moist mucous membranes  Resp: RR regular, no cough, no dyspnea  CV: regular, no edema  GI: soft, non-tender, non-distended, +BS  Skin: warm, dry  MS: No acute abnormalities, normal ROM  Neuro: Alert, oriented x 3, able to follow commands  Psych: calm, cooperative      Assessment & Plan:  Palliative care consulted for symptom management, support in the setting of an advanced illness, goals of care discussion. Pt is a 87 y.o.  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  Symptom Management:  ***    Illness Understanding:  ***    Goals of Care:  ***    Advanced Care Planning:    Time Spent: More than half of this *** minute visit was spent counseling the patient on their medical condition and prognosis, overall goals of care, symptom management, and providing emotional and psychosocial support. Total time includes time spent preparing to see the patient, review of medical records, tests, and imaging, time spent documenting clinical information in the health record, referring and communicating with other healthcare professionals, and direct patient contact and care including history and physical exam.     Hulen Skains, NP,ACHPN

## 2020-09-27 ENCOUNTER — Encounter (HOSPITAL_BASED_OUTPATIENT_CLINIC_OR_DEPARTMENT_OTHER): Admitting: Internal Medicine

## 2020-09-27 MED ORDER — prochlorperazine (Compazine) 10 mg tablet
10 | ORAL_TABLET | Freq: Four times a day (QID) | ORAL | 1 refills | Status: DC | PRN
Start: 2020-09-27 — End: 2020-11-20

## 2020-09-27 MED ORDER — loperamide (Imodium) 2 mg capsule
2 | ORAL_CAPSULE | ORAL | 3 refills | 7.00000 days | Status: DC
Start: 2020-09-27 — End: 2020-11-20

## 2020-09-27 MED ORDER — sennosides (Senokot) 8.6 mg tablet
8.6 | ORAL_TABLET | Freq: Every evening | ORAL | 1 refills | Status: DC
Start: 2020-09-27 — End: 2020-11-20

## 2020-09-27 MED ORDER — enzalutamide (Xtandi) 40 mg chemo capsule
40 | ORAL_CAPSULE | Freq: Every day | ORAL | 5 refills | Status: DC
Start: 2020-09-27 — End: 2020-10-15

## 2020-10-02 ENCOUNTER — Emergency Department: Admit: 2020-10-02 | Payer: MEDICARE | Primary: Internal Medicine

## 2020-10-02 ENCOUNTER — Encounter

## 2020-10-02 ENCOUNTER — Other Ambulatory Visit (HOSPITAL_BASED_OUTPATIENT_CLINIC_OR_DEPARTMENT_OTHER)

## 2020-10-02 ENCOUNTER — Inpatient Hospital Stay
Admission: EM | Admit: 2020-10-02 | Discharge: 2020-10-10 | Disposition: A | Discharge status: Rehabilitation Facility | Payer: MEDICARE | Admitting: Nephrology

## 2020-10-02 ENCOUNTER — Ambulatory Visit: Admit: 2020-10-02 | Discharge: 2020-10-02 | Payer: MEDICARE | Attending: Nurse Practitioner | Primary: Internal Medicine

## 2020-10-02 ENCOUNTER — Other Ambulatory Visit

## 2020-10-02 ENCOUNTER — Encounter (HOSPITAL_BASED_OUTPATIENT_CLINIC_OR_DEPARTMENT_OTHER)

## 2020-10-02 DIAGNOSIS — I63512 Cerebral infarction due to unspecified occlusion or stenosis of left middle cerebral artery: Principal | ICD-10-CM

## 2020-10-02 DIAGNOSIS — C61 Malignant neoplasm of prostate: Secondary | ICD-10-CM

## 2020-10-02 DIAGNOSIS — I340 Nonrheumatic mitral (valve) insufficiency: Secondary | ICD-10-CM | POA: Diagnosis not present

## 2020-10-02 DIAGNOSIS — I5022 Chronic systolic (congestive) heart failure: Secondary | ICD-10-CM | POA: Diagnosis not present

## 2020-10-02 DIAGNOSIS — Z20822 Contact with and (suspected) exposure to covid-19: Secondary | ICD-10-CM | POA: Diagnosis not present

## 2020-10-02 DIAGNOSIS — R001 Bradycardia, unspecified: Secondary | ICD-10-CM | POA: Diagnosis not present

## 2020-10-02 DIAGNOSIS — I639 Cerebral infarction, unspecified: Secondary | ICD-10-CM | POA: Diagnosis not present

## 2020-10-02 DIAGNOSIS — I63412 Cerebral infarction due to embolism of left middle cerebral artery: Secondary | ICD-10-CM | POA: Diagnosis not present

## 2020-10-02 DIAGNOSIS — C7951 Secondary malignant neoplasm of bone: Secondary | ICD-10-CM | POA: Diagnosis not present

## 2020-10-02 DIAGNOSIS — R471 Dysarthria and anarthria: Secondary | ICD-10-CM | POA: Diagnosis not present

## 2020-10-02 DIAGNOSIS — I4892 Unspecified atrial flutter: Secondary | ICD-10-CM | POA: Diagnosis not present

## 2020-10-02 DIAGNOSIS — R509 Fever, unspecified: Secondary | ICD-10-CM | POA: Diagnosis not present

## 2020-10-02 DIAGNOSIS — J189 Pneumonia, unspecified organism: Secondary | ICD-10-CM | POA: Diagnosis not present

## 2020-10-02 DIAGNOSIS — G319 Degenerative disease of nervous system, unspecified: Secondary | ICD-10-CM | POA: Diagnosis not present

## 2020-10-02 DIAGNOSIS — I130 Hypertensive heart and chronic kidney disease with heart failure and stage 1 through stage 4 chronic kidney disease, or unspecified chronic kidney disease: Secondary | ICD-10-CM | POA: Diagnosis not present

## 2020-10-02 DIAGNOSIS — R9082 White matter disease, unspecified: Secondary | ICD-10-CM | POA: Diagnosis not present

## 2020-10-02 DIAGNOSIS — R4701 Aphasia: Secondary | ICD-10-CM | POA: Diagnosis not present

## 2020-10-02 DIAGNOSIS — I6789 Other cerebrovascular disease: Secondary | ICD-10-CM | POA: Diagnosis not present

## 2020-10-02 DIAGNOSIS — R29703 NIHSS score 3: Secondary | ICD-10-CM | POA: Diagnosis not present

## 2020-10-02 DIAGNOSIS — J690 Pneumonitis due to inhalation of food and vomit: Secondary | ICD-10-CM | POA: Diagnosis not present

## 2020-10-02 DIAGNOSIS — N183 Chronic kidney disease, stage 3 unspecified: Secondary | ICD-10-CM | POA: Diagnosis not present

## 2020-10-02 DIAGNOSIS — Z8679 Personal history of other diseases of the circulatory system: Secondary | ICD-10-CM | POA: Diagnosis not present

## 2020-10-02 HISTORY — PX: MRA NECK W AND WO CONTRAST: IMG268

## 2020-10-02 HISTORY — PX: MRA HEAD WO CONTRAST: IMG263

## 2020-10-02 LAB — URINALYSIS REFLEX TO CULTURE
Bilirubin, Ur: NEGATIVE
Blood, Ur: NEGATIVE
Glucose,Ur: NEGATIVE mg/dL
Ketones, Ur: NEGATIVE mg/dL
Leukocyte Esterase, Ur: NEGATIVE WBC/uL
Nitrite, Ur: NEGATIVE
Protein,Ur: NEGATIVE mg/dL
Specific Gravity, Ur: 1.018 (ref 1.003–1.030)
Urobilinogen, Ur: 2 — AB
pH, Ur: 6 (ref 5.0–8.0)

## 2020-10-02 LAB — PTT: aPTT: 25 s (ref 23–32)

## 2020-10-02 LAB — COMPREHENSIVE METABOLIC PANEL
ALT: 19 U/L (ref 0–55)
AST: 28 U/L (ref 6–42)
Albumin: 3.6 g/dL (ref 3.2–5.0)
Alkaline phosphatase: 76 U/L (ref 30–130)
Anion Gap: 6 mmol/L (ref 3–14)
BUN: 20 mg/dL (ref 6–24)
Bilirubin, total: 0.6 mg/dL (ref 0.2–1.2)
CO2 (Bicarbonate): 25 mmol/L (ref 20–32)
Calcium: 9 mg/dL (ref 8.5–10.5)
Chloride: 109 mmol/L (ref 98–110)
Creatinine: 1.49 mg/dL — ABNORMAL HIGH (ref 0.55–1.30)
Glucose: 126 mg/dL — ABNORMAL HIGH (ref 70–110)
Potassium: 4.1 mmol/L (ref 3.6–5.2)
Protein, total: 7.5 g/dL (ref 6.0–8.4)
Sodium: 140 mmol/L (ref 135–146)
eGFRcr: 45 mL/min/{1.73_m2} — ABNORMAL LOW (ref >=60)

## 2020-10-02 LAB — POCT GLUCOSE: POCT Glucose: 152 mg/dL — ABNORMAL HIGH (ref 70–110)

## 2020-10-02 LAB — TSH WITH REFLEX: TSH: 2.84 u[IU]/mL (ref 0.358–3.740)

## 2020-10-02 LAB — CBC WITH DIFFERENTIAL
Basophils %: 0.5 %
Basophils Absolute: 0.03 10*3/uL (ref 0.00–0.22)
Eosinophils %: 3.8 %
Eosinophils Absolute: 0.22 10*3/uL (ref 0.00–0.50)
Hematocrit: 37.9 % (ref 37.0–53.0)
Hemoglobin: 12.8 g/dL — ABNORMAL LOW (ref 13.0–17.5)
Immature Granulocytes %: 0.2 %
Immature Granulocytes Absolute: 0.01 10*3/uL (ref 0.00–0.10)
Lymphocyte %: 34.2 %
Lymphocytes Absolute: 2 10*3/uL (ref 0.70–4.00)
MCH: 34.8 pg — ABNORMAL HIGH (ref 26.0–34.0)
MCHC: 33.8 g/dL (ref 31.0–37.0)
MCV: 103 fL — ABNORMAL HIGH (ref 80.0–100.0)
MPV: 10.1 fL (ref 9.1–12.4)
Monocytes %: 8.4 %
Monocytes Absolute: 0.49 10*3/uL (ref 0.38–0.83)
NRBC %: 0 % (ref 0.0–0.0)
NRBC Absolute: 0 10*3/uL (ref 0.00–2.00)
Neutrophil %: 52.9 %
Neutrophils Absolute: 3.1 10*3/uL (ref 1.50–7.95)
Platelets: 162 10*3/uL (ref 150–400)
RBC: 3.68 M/uL — ABNORMAL LOW (ref 4.20–5.90)
RDW-CV: 13.2 % (ref 11.5–14.5)
RDW-SD: 50.7 fL (ref 35.0–51.0)
WBC: 5.9 10*3/uL (ref 4.0–11.0)

## 2020-10-02 LAB — VITAMIN B12: Vitamin B-12: 1036 pg/mL — ABNORMAL HIGH (ref 193–986)

## 2020-10-02 LAB — SARS/FLU/RSV
Influenza A RNA: NOT DETECTED
Influenza B RNA: NOT DETECTED
Respiratory syncytial virus: NOT DETECTED
SARS-CoV-2 RNA PCR: NOT DETECTED

## 2020-10-02 LAB — PROTIME-INR
INR: <1
Protime: 10.4 s (ref 9.3–11.6)

## 2020-10-02 LAB — LIPID PANEL
Cholesterol: 217 mg/dL — ABNORMAL HIGH (ref <=200)
HDL cholesterol: 50 mg/dL (ref >=40)
LDL cholesterol, calculated: 98 mg/dL (ref 0–130)
Triglycerides: 346 mg/dL — ABNORMAL HIGH (ref <=150)

## 2020-10-02 LAB — LIGHT BLUE TOP

## 2020-10-02 LAB — TRIGLYCERIDES: Triglycerides: 346 mg/dL — ABNORMAL HIGH (ref <=150)

## 2020-10-02 LAB — RAINBOW DRAW SST GOLD TOP

## 2020-10-02 MED ORDER — ondansetron ODT (Zofran-ODT) disintegrating tablet 4 mg
4 | Freq: Three times a day (TID) | ORAL | Status: DC | PRN
Start: 2020-10-02 — End: 2020-10-10

## 2020-10-02 MED ORDER — acetaminophen (Tylenol) liquid 650 mg
160 | ORAL | Status: DC | PRN
Start: 2020-10-02 — End: 2020-10-10

## 2020-10-02 MED ORDER — docusate sodium (Colace) capsule 100 mg
100 | Freq: Two times a day (BID) | ORAL | Status: DC
Start: 2020-10-02 — End: 2020-10-10
  Administered 2020-10-03 – 2020-10-09 (×12): 100 mg via ORAL

## 2020-10-02 MED ORDER — labetalol (Normodyne) tablet 50 mg
100 | Freq: Two times a day (BID) | ORAL | Status: DC
Start: 2020-10-02 — End: 2020-10-10
  Administered 2020-10-03 – 2020-10-09 (×9): 50 via ORAL

## 2020-10-02 MED ORDER — loperamide (Imodium) capsule 2 mg
2 | Freq: Four times a day (QID) | ORAL | Status: DC | PRN
Start: 2020-10-02 — End: 2020-10-10
  Administered 2020-10-08: 08:00:00 2 mg via ORAL

## 2020-10-02 MED ORDER — polyethylene glycol (Glycolax) packet 17 g
17 | Freq: Every day | ORAL | Status: DC | PRN
Start: 2020-10-02 — End: 2020-10-10

## 2020-10-02 MED ORDER — pantoprazole (ProtoNix) EC tablet 40 mg
40 | Freq: Every day | ORAL | Status: DC
Start: 2020-10-02 — End: 2020-10-02

## 2020-10-02 MED ORDER — acetaminophen (Tylenol) tablet 650 mg
325 | ORAL | Status: DC | PRN
Start: 2020-10-02 — End: 2020-10-10
  Administered 2020-10-08: 20:00:00 650 mg via ORAL

## 2020-10-02 MED ORDER — acetaminophen (Tylenol) suppository 650 mg
650 | RECTAL | Status: DC | PRN
Start: 2020-10-02 — End: 2020-10-10

## 2020-10-02 MED ORDER — pantoprazole (ProtoNix) EC tablet 20 mg
20 | Freq: Every day | ORAL | Status: DC
Start: 2020-10-02 — End: 2020-10-10
  Administered 2020-10-03 – 2020-10-09 (×7): 20 mg via ORAL

## 2020-10-02 MED ORDER — aspirin EC tablet 81 mg
81 | Freq: Every day | ORAL | Status: DC
Start: 2020-10-02 — End: 2020-10-10
  Administered 2020-10-03 – 2020-10-09 (×7): 81 mg via ORAL

## 2020-10-02 MED ORDER — bisacodyl (Dulcolax) suppository 10 mg
10 | Freq: Every day | RECTAL | Status: DC | PRN
Start: 2020-10-02 — End: 2020-10-10

## 2020-10-02 MED ORDER — sodium chloride 0.9 % flush 1 mL
Freq: Three times a day (TID) | INTRAMUSCULAR | Status: DC | PRN
Start: 2020-10-02 — End: 2020-10-10

## 2020-10-02 MED ORDER — gadoterate meglumine (Dotarem) 0.5 mmol/mL injection 15 mL
0.5 | Freq: Once | INTRAVENOUS | Status: AC
Start: 2020-10-02 — End: 2020-10-02
  Administered 2020-10-03: 03:00:00 15 mL via INTRAVENOUS

## 2020-10-02 MED ORDER — atorvastatin (Lipitor) tablet 80 mg
40 | Freq: Every evening | ORAL | Status: DC
Start: 2020-10-02 — End: 2020-10-10
  Administered 2020-10-03 – 2020-10-09 (×8): 80 mg via ORAL

## 2020-10-02 MED ORDER — gabapentin (Neurontin) capsule 600 mg
300 | Freq: Three times a day (TID) | ORAL | Status: DC
Start: 2020-10-02 — End: 2020-10-10
  Administered 2020-10-03 – 2020-10-09 (×22): 600 mg via ORAL

## 2020-10-02 MED ORDER — ondansetron (Zofran) injection 4 mg
4 | Freq: Four times a day (QID) | INTRAMUSCULAR | Status: DC | PRN
Start: 2020-10-02 — End: 2020-10-10

## 2020-10-02 NOTE — ED Triage Notes (Signed)
?  Stroke. LKW Friday 09/28/20. Daughter states he "has trouble getting words out". Pt has bone and prostate cancer.

## 2020-10-02 NOTE — ED Notes (Signed)
 Report given to [facility] RN. Patient currently at MRI, instructed to bring PT to [facility] when test is done.      Venia Carbon, RN  10/02/20 2157

## 2020-10-02 NOTE — ED Provider Notes (Signed)
 [facility] EMERGENCY DEPARTMENT MAIN CAMPUS  [address]  [place] [address]    PATIENT  Tyler Huether JR.  DOB: [birth year], MRN: [id number]    DATE OF SERVICE  10/02/20    CHIEF COMPLAINT  Haley is a 87 y.o. male  has a past medical history of Atrial flutter (CMS/HCC), Bone cancer (CMS/HCC), CHF (congestive heart failure) (CMS/HCC), HTN (hypertension), and PC (prostate cancer) (CMS/HCC). presenting with a chief complaint of Stroke      HISTORY OF PRESENT ILLNESS  87 year old male who presents with 4 days of new onset dysarthria.  His daughter denies any weakness or numbness.  The patient denies any vision changes, headache.  He is clearly frustrated by his difficulty speaking.      History provided by:  Medical records and patient  Illness  Severity:  Severe  Onset quality:  Sudden  Duration:  4 days  Timing:  Constant  Progression:  Worsening  Chronicity:  New  Associated symptoms: no abdominal pain, no chest pain, no congestion, no cough, no diarrhea, no fever, no headaches, no myalgias, no rash, no rhinorrhea, no shortness of breath, no sore throat and no vomiting        REVIEW OF SYSTEMS  Review of Systems   Constitutional: Negative for chills, diaphoresis and fever.   HENT: Negative for congestion, rhinorrhea and sore throat.    Eyes: Negative for pain, redness and visual disturbance.   Respiratory: Negative for apnea, cough and shortness of breath.    Cardiovascular: Negative for chest pain and palpitations.   Gastrointestinal: Negative for abdominal distention, abdominal pain, constipation, diarrhea and vomiting.   Genitourinary: Negative for dysuria, flank pain and hematuria.   Musculoskeletal: Negative for back pain, joint swelling and myalgias.   Skin: Negative for color change and rash.   Allergic/Immunologic: Negative for environmental allergies and food allergies.   Neurological: Positive for speech difficulty. Negative for dizziness, weakness, numbness and headaches.   Psychiatric/Behavioral:  Negative for agitation, confusion and suicidal ideas.   All other systems reviewed and are negative.      PATIENT HISTORY  MEDICAL  Past Medical History:   Diagnosis Date   . Atrial flutter (CMS/HCC)    . Bone cancer (CMS/HCC)    . CHF (congestive heart failure) (CMS/HCC)    . HTN (hypertension)    . PC (prostate cancer) (CMS/HCC)        SURGICAL  Past Surgical History:   Procedure Laterality Date   . MRA HEAD WO CONTRAST  10/02/2020    MRA HEAD WO CONTRAST 10/02/2020 [facility] MRI   . MRA NECK W AND WO CONTRAST  10/02/2020    MRA NECK W AND WO CONTRAST 10/02/2020 [facility] MRI       ALLERGIES  No Known Allergies    FAMILY  No family history on file.    SOCIAL  Social History     Tobacco Use   . Smoking status: Former Smoker     Quit date: 1962     Years since quitting: 60.6   . Smokeless tobacco: Never Used   Substance Use Topics   . Alcohol use: Not Currently   . Drug use: Never       PHYSICAL EXAM  TRIAGE (FIRST SET) VITAL SIGNS  Temp: 36.6 C (97.8 F) Oral  Pulse: 61  BP: (!) 167/79  Resp: 20  SpO2: 98 % Oxygen Therapy: None (Room air)    Vitals:    10/02/20 2309   BP:  Pulse:    Resp: 16   Temp:    SpO2: 97%       BP Readings from Last 5 Encounters:   10/02/20 (!) 178/84   09/05/20 125/71   08/19/20 126/79   09/13/19 (!) 136/90   09/21/18 (!) 142/90       Physical Exam  Vitals and nursing note reviewed.   Constitutional:       General: He is not in acute distress.     Appearance: Normal appearance. He is not ill-appearing, toxic-appearing or diaphoretic.   HENT:      Head: Normocephalic and atraumatic.      Right Ear: External ear normal.      Left Ear: External ear normal.      Nose: Nose normal.      Mouth/Throat:      Mouth: Mucous membranes are moist.   Eyes:      Extraocular Movements: Extraocular movements intact.      Conjunctiva/sclera: Conjunctivae normal.      Pupils: Pupils are equal, round, and reactive to light.   Cardiovascular:      Rate and Rhythm: Regular rhythm.      Pulses: Normal pulses.      Heart  sounds: Normal heart sounds.   Pulmonary:      Effort: Pulmonary effort is normal. No respiratory distress.      Breath sounds: Normal breath sounds.   Abdominal:      General: Bowel sounds are normal. There is no distension.      Palpations: Abdomen is soft.      Tenderness: There is no abdominal tenderness.   Musculoskeletal:         General: No tenderness or deformity. Normal range of motion.      Cervical back: Normal range of motion and neck supple. No rigidity.   Skin:     General: Skin is warm and dry.      Findings: No rash.   Neurological:      Mental Status: He is alert and oriented to person, place, and time. Mental status is at baseline.      Cranial Nerves: No cranial nerve deficit.      Sensory: No sensory deficit.      Motor: No weakness.   Psychiatric:         Mood and Affect: Mood normal.         Behavior: Behavior normal.         RESULTS  Admission on 10/02/2020   Component Date Value   . POCT Glucose 10/02/2020 152 (A)   . Sodium 10/02/2020 140    . Potassium 10/02/2020 4.1    . Chloride 10/02/2020 109    . CO2 (Bicarbonate) 10/02/2020 25    . Anion Gap 10/02/2020 6    . BUN 10/02/2020 20    . Creatinine 10/02/2020 1.49 (A)   . eGFRcr 10/02/2020 45 (A)   . Glucose 10/02/2020 126 (A)   . Fasting? 10/02/2020 No    . Calcium 10/02/2020 9.0    . AST 10/02/2020 28    . ALT 10/02/2020 19    . Alkaline phosphatase 10/02/2020 76    . Protein, total 10/02/2020 7.5    . Albumin 10/02/2020 3.6    . Bilirubin, total 10/02/2020 0.6    . WBC 10/02/2020 5.9    . RBC 10/02/2020 3.68 (A)   . Hemoglobin 10/02/2020 12.8 (A)   . Hematocrit 10/02/2020 37.9    . MCV 10/02/2020 103.0 (  A)   . MCH 10/02/2020 34.8 (A)   . MCHC 10/02/2020 33.8    . RDW-CV 10/02/2020 13.2    . RDW-SD 10/02/2020 50.7    . Platelets 10/02/2020 162    . MPV 10/02/2020 10.1    . Neutrophil % 10/02/2020 52.9    . Lymphocyte % 10/02/2020 34.2    . Monocytes % 10/02/2020 8.4    . Eosinophils % 10/02/2020 3.8    . Basophils % 10/02/2020 0.5    .  Immature Granulocytes % 10/02/2020 0.2    . NRBC % 10/02/2020 0.0    . Neutrophils Absolute 10/02/2020 3.10    . Lymphocytes Absolute 10/02/2020 2.00    . Monocytes Absolute 10/02/2020 0.49    . Eosinophils Absolute 10/02/2020 0.22    . Basophils Absolute 10/02/2020 0.03    . Immature Granulocytes Ab* 10/02/2020 0.01    . NRBC Absolute 10/02/2020 0.00    . Extra Tube 10/02/2020 Hold for add-ons.    . Extra Tube 10/02/2020 Hold for add-ons.    . Color, Ur 10/02/2020 Yellow    . Clarity, Ur 10/02/2020 Clear    . Specific Gravity, Ur 10/02/2020 1.018    . pH, Ur 10/02/2020 6.0    . Protein,Ur 10/02/2020 Negative    . Glucose,Ur 10/02/2020 Negative    . Ketones, Ur 10/02/2020 Negative    . Bilirubin, Ur 10/02/2020 Negative    . Blood, Ur 10/02/2020 Negative    . Urobilinogen, Ur 10/02/2020 2.0  (A)   . Nitrite, Ur 10/02/2020 Negative    . Leukocyte Esterase, Ur 10/02/2020 Negative    . Protime 10/02/2020 10.4    . INR 10/02/2020 <1.00    . aPTT 10/02/2020 25    . Vitamin B-12 10/02/2020 1,036 (A)   . TSH 10/02/2020 2.840    . Triglycerides 10/02/2020 346 (A)   . Cholesterol 10/02/2020 217 (A)   . HDL cholesterol 10/02/2020 50    . LDL cholesterol, calcula* 02/72/5366 98    . Triglycerides 10/02/2020 346 (A)   . Influenza A DNA 10/02/2020 Not Detected    . Influenza B DNA 10/02/2020 Not Detected    . SARS-CoV-2 RNA PCR 10/02/2020 Not Detected    . Respiratory syncytial vi* 10/02/2020 Not Detected        Lab Results   Component Value Date    HGB 12.8 (L) 10/02/2020    HGB 12.5 (L) 09/03/2020    HGB 11.8 (L) 07/25/2020    HCT 37.9 10/02/2020    HCT 36.1 (L) 09/03/2020    HCT 35.6 (L) 07/25/2020    BUN 20 10/02/2020    BUN 22 07/25/2020       MRA NECK W AND WO CONTRAST   Final Result   No cervical arterial occlusion, high-grade stenosis, or evidence of dissection.        Loni Beckwith, MD 10/02/2020 11:20 PM      MR BRAIN WO CONTRAST   Final Result   Addendum 1 of 1   A Tigertext electronic message was sent to Dr.  Benay Pike on    10/02/2020 10:49 PM .      Loni Beckwith, MD 10/02/2020 10:55 PM      Final   1.  Left MCA territory likely acute ischemic infarct without hemorrhage or mass effect. Less likely ictal related seizure edema or encephalitis.   2.  No intracranial large vessel occlusion or aneurysm.   3.  Absent mid to to distal right posterior cerebral  artery flow related enhancement, likely severe/critical stenosis and less likely occlusion given the absence of acute or prior infarct.         A Tigertext electronic message was sent to the Dr. Tobe Sos on 10/02/2020 10:32 PM .      Loni Beckwith, MD 10/02/2020 10:32 PM      MRA HEAD WO CONTRAST   Final Result   Addendum 1 of 1   A Tigertext electronic message was sent to Dr. Benay Pike on    10/02/2020 10:49 PM .      Loni Beckwith, MD 10/02/2020 10:55 PM      Final   1.  Left MCA territory likely acute ischemic infarct without hemorrhage or mass effect. Less likely ictal related seizure edema or encephalitis.   2.  No intracranial large vessel occlusion or aneurysm.   3.  Absent mid to to distal right posterior cerebral artery flow related enhancement, likely severe/critical stenosis and less likely occlusion given the absence of acute or prior infarct.         A Tigertext electronic message was sent to the Dr. Tobe Sos on 10/02/2020 10:32 PM .      Loni Beckwith, MD 10/02/2020 10:32 PM      CT HEAD WO CONTRAST   Final Result      No acute intracranial pathology identified.    ____________________________________________      Romana Juniper 10/02/2020 4:26 PM      Vascular US carotid artery duplex bilateral    (Results Pending)       No orders to display       ED COURSE/MEDICAL DECISION MAKING    Chart review performed of prior records as available to obtain additional history.    Reviewed and confirmed history obtained in nursing triage notes regarding past medical history, past social history, and past family  history.    Last known well was 4 days ago  On anticoagulation with aspirin    Interventions: POC Glucose WNLs, BP management with labetalol PRN, sent for CT head    NIHSS Total Score: 3    A/P: Differential is broad including hypoglycemia, ischemic stroke, TIA, transverse myelitis, multiple sclerosis, reversible cerebral vasoconstriction syndrome, posterior reversible encephalopathy syndrome, seizure, syncope, ICH, complex migraine, hypokalemic periodic paralysis, NPH, electrolyte abnormality such as hyponatremia or hypercalcemia, UTI, sepsis, meningitis, encephalitis, DKA/HHS, uremic or hepatic encephalopathy, illicit drug overdose or polypharmacy medication misadventure, delirium, ETOH or benzo withdrawal, dementia, thyroid storm, myxedema coma, neurosyphilis, or B12 deficiency.    - basic labs, tox screens, ua  - CT head  - neuro consult  - stroke labs: A1C, TSH, lipid panel, triglycerides, B12  - NPO until passes speech and swallow assessment  - goal SBP < 220/120  - ASA 325 mg once pt clears speech/swallow assessment; NPO for now  - will treat hyperglycemia PRN as it is associated with poor neurological outcomes in stroke, increases tissue acidosis/free radicals/increased BBB permeability/ worsens ischemic damage. Goal glucose of 140-180    Consulted and discussed pt's case and care plan with neurology      Cardiac monitoring was ordered by me and indicated during ED stay for stroke and to monitor the patient for dysrhythmia. Cardiac monitor revealed normal sinus rhythm as interpreted independently and contemporaneously by me with a rate of 64.    Will admit.    Update:  Neuro recommends MRI, admission.      Meds Given this ED Visit, if any:  ---  ED Medication Administration from 10/02/2020 1436 to 10/02/2020 2252       Date/Time Order Dose Route Action Action by     10/02/2020 1930 aspirin EC tablet 81 mg 81 mg oral Not Given Caraccio, S     10/02/2020 2235 gadoterate meglumine (Dotarem) 0.5 mmol/mL  injection 15 mL 15 mL intravenous Given Mireault, C          Diagnoses as of 10/03/20 0131   Dysarthria   Cerebrovascular accident (CVA), unspecified mechanism (CMS/HCC)       Critical Care  Performed by: Braulio Conte, MD  Authorized by: Braulio Conte, MD     Critical care provider statement:     Critical care time (minutes):  40    Critical care time was exclusive of:  Separately billable procedures and treating other patients and teaching time    Critical care was necessary to treat or prevent imminent or life-threatening deterioration of the following conditions:  CNS failure or compromise    Critical care was time spent personally by me on the following activities:  Ordering and performing treatments and interventions, ordering and review of laboratory studies, ordering and review of radiographic studies, development of treatment plan with patient or surrogate, discussions with consultants, re-evaluation of patient's condition, evaluation of patient's response to treatment, examination of patient, review of old charts, interpretation of cardiac output measurements, obtaining history from patient or surrogate and pulse oximetry    I assumed direction of critical care for this patient from another provider in my specialty: no      Care discussed with: admitting provider    Comments:      In assessment, the patient is in critical condition and there is a high probability of imminent or life-threatening deterioration in the patient's condition. Prevention of a clinically significant change in the patient's condition required my direct frequent patient re-assessment, ongoing interpretation of multiple data sets, coordination and communication of care with consulting providers and family, and frequent adjustment of the treatment plan.    I, the ED attending, personally spent the above listed amount of time directly in the care of this patient exclusive of separately billable procedures.         Braulio Conte, MD  10/03/20 [phone]

## 2020-10-02 NOTE — Progress Notes (Signed)
 Palliative Care Visit Note  Palliative care consulted for symptom management, support in the setting of an advanced illness, goals of care discussion. Pt is a 87 y.o. yo male with a history of metastatic prostate cancer, recently hospitalized after a fall. MRI of the spine showed a mild compression fracture at L2. He underwent hypoplasty. Over the past few years he has been splitting his time between [place] and [place].  He has resumed care at the [facility] cancer center and is on Lupron, [place].     He is seen in the cancer center with his dtr Robin.     She tells me that for the past several days pt has been more confused. His speech has been slurred/garbled. He was not able to write or recognize his written name. His dtr explains this is far from his baseline. Normally he is very sharp.    I ask him to give me a summary of his cancer care and to help my understand how he transitions his care from here to [facility]. He is able to start a sentence of explanation, but it deteriorates into gibberish. His dtr explains that he has recurrent prostate cancer and that the oncologist in [place] was given him his Lupron on a difference schedule, every four months rather than every three months as he was getting it here. She also tells me a little about his plan to restart treatment up here.    She tells me that at one point he was on a fentanyl patch for his bone/back pain, and that he was taken off this in [place]. She is unclear about when this was stopped. I search [facility] and there is no recent history of him being prescribed fentanyl. I ask him to tell me about his back pain and again he can start his sentence, but after a few words it is all gibberish.     I ask Dr. Kem Kays to see him as this is the first time I am meeting him and she has established care with him already. After her assessment and discussion with his dtr, we decide to send him to the ED, ? CVA. Pt is escorted to  the ED and I discuss his case with the inbound coordinator.     Goals of care: not addressed at this visit    Functional Assessment: PPS Score:   30-40%     PCP: Perfecto Kingdom, MD    HCP:  None on file  MOLST:  none on file      PMH:  metastatic prostate cancer    No past surgical history on file.    No family history on file.    Social History     Tobacco Use   . Smoking status: Former Smoker     Quit date: 1962     Years since quitting: 60.6   . Smokeless tobacco: Never Used   Substance Use Topics   . Alcohol use: Not on file   . Drug use: Not on file         Review of Systems: Negative expect as documented above    Current Outpatient Medications   Medication Instructions   . aspirin 81 mg, oral, Daily RT   . calcium carbonate-vitamin D3 500 mg-5 mcg (200 unit) tablet 1 tablet, oral, Daily   . cyanocobalamin, vitamin B-12, (VITAMIN B-12 ORAL) Daily   . enzalutamide (XTANDI) 80 mg, oral, Daily, Take with or without food at the same  time each day. Do not crush, break, or dissolve. Swallow it whole.   . furosemide (Lasix) 40 mg tablet oral   . gabapentin (NEURONTIN) 600 mg, oral, 3 times daily PRN   . labetalol (Normodyne) 100 mg tablet 0.5 tablets, oral, 2 times daily   . loperamide (Imodium) 2 mg capsule Take 2 capsules (4 mg total) by mouth after first loose stool, then 1 capsule (2 mg total) after each loose stool. Max 8 capsules per day.   . losartan (COZAAR) 50 mg, oral, Daily   . omega-3 (Fish Oil) 300-1,000 mg capsule 1,000 mg, oral, 3 times weekly   . omeprazole (PRILOSEC) 20 mg, oral, Daily   . prochlorperazine (COMPAZINE) 10 mg, oral, Every 6 hours PRN   . sennosides (SENOKOT) 17.2 mg, oral, Nightly, Hold for loose stool.       No Known Allergies      Physical Exam  There were no vitals taken for this visit.    Physcial Exam:    General: elderly male sitting in a W/C in NAD.  HEENT:  head atraumatic, moist mucous membranes  Resp: RR regular, no cough, no dyspnea  CV: no edema  GI: soft, non-tender,  non-distended  MS: No acute abnormalities  Neuro: Alert, oriented x 2, able to follow simple commands, speech garbled  Psych: calm, cooperative    Assessment & Plan:  Palliative care consulted for symptom management, support in the setting of an advanced illness, goals of care discussion. Pt is a 87 y.o. yo male with a history of metastatic prostate cancer, recently hospitalized after a fall. MRI of the spine showed a mild compression fracture at L2. He underwent hypoplasty. Over the past few years he has been splitting his time between [place] and [place].  He has resumed care at the [facility] cancer center and is on Lupron, [place].     He is seen in the cancer center today with his dtr for an initial palliative care visit. Dtr expresses concerns about a few days of increased confusion, difficulty speaking, difficult recognizing words. Pt exhibits this behavior today as well. After consultation with his oncologist, he is sent to the ED, ? CVA.    Time Spent: More than half of this 45 minute visit was spent counseling the patient on their medical condition and prognosis, overall goals of care, symptom management, and providing emotional and psychosocial support. Total time includes time spent preparing to see the patient, review of medical records, tests, and imaging, time spent documenting clinical information in the health record, referring and communicating with other healthcare professionals, and direct patient contact and care including history and physical exam.     Hulen Skains, NP,ACHPN  D/w: oncology, ED staff

## 2020-10-03 ENCOUNTER — Inpatient Hospital Stay: Payer: MEDICARE | Primary: Internal Medicine

## 2020-10-03 ENCOUNTER — Other Ambulatory Visit

## 2020-10-03 ENCOUNTER — Inpatient Hospital Stay: Admit: 2020-10-03 | Payer: MEDICARE | Primary: Internal Medicine

## 2020-10-03 DIAGNOSIS — Z8679 Personal history of other diseases of the circulatory system: Secondary | ICD-10-CM | POA: Diagnosis not present

## 2020-10-03 DIAGNOSIS — J690 Pneumonitis due to inhalation of food and vomit: Secondary | ICD-10-CM | POA: Diagnosis not present

## 2020-10-03 DIAGNOSIS — R001 Bradycardia, unspecified: Secondary | ICD-10-CM | POA: Diagnosis not present

## 2020-10-03 DIAGNOSIS — R471 Dysarthria and anarthria: Secondary | ICD-10-CM | POA: Diagnosis not present

## 2020-10-03 DIAGNOSIS — N183 Chronic kidney disease, stage 3 unspecified: Secondary | ICD-10-CM | POA: Diagnosis not present

## 2020-10-03 DIAGNOSIS — I639 Cerebral infarction, unspecified: Secondary | ICD-10-CM | POA: Diagnosis not present

## 2020-10-03 DIAGNOSIS — I340 Nonrheumatic mitral (valve) insufficiency: Secondary | ICD-10-CM | POA: Diagnosis not present

## 2020-10-03 DIAGNOSIS — I63412 Cerebral infarction due to embolism of left middle cerebral artery: Secondary | ICD-10-CM | POA: Diagnosis not present

## 2020-10-03 LAB — CBC
Hematocrit: 34.4 % — ABNORMAL LOW (ref 37.0–53.0)
Hemoglobin: 12 g/dL — ABNORMAL LOW (ref 13.0–17.5)
MCH: 35.1 pg — ABNORMAL HIGH (ref 26.0–34.0)
MCHC: 34.9 g/dL (ref 31.0–37.0)
MCV: 100.6 fL — ABNORMAL HIGH (ref 80.0–100.0)
MPV: 9.5 fL (ref 9.1–12.4)
NRBC %: 0 % (ref 0.0–0.0)
NRBC Absolute: 0 10*3/uL (ref 0.00–2.00)
Platelets: 126 10*3/uL — ABNORMAL LOW (ref 150–400)
RBC: 3.42 M/uL — ABNORMAL LOW (ref 4.20–5.90)
RDW-CV: 13.2 % (ref 11.5–14.5)
RDW-SD: 48.8 fL (ref 35.0–51.0)
WBC: 5.5 10*3/uL (ref 4.0–11.0)

## 2020-10-03 LAB — HEMOGLOBIN A1C
Estimated Average Glucose mg/dL (INT/EXT): 111 mg/dL
Estimated Average Glucose mg/dL (INT/EXT): 111 mg/dL
HEMOGLOBIN A1C % (INT/EXT): 5.5 % (ref <5.6)
HEMOGLOBIN A1C % (INT/EXT): 5.5 % (ref <5.6)

## 2020-10-03 LAB — BASIC METABOLIC PANEL
Anion Gap: 9 mmol/L (ref 3–14)
BUN: 24 mg/dL (ref 6–24)
CO2 (Bicarbonate): 23 mmol/L (ref 20–32)
Calcium: 9.1 mg/dL (ref 8.5–10.5)
Chloride: 109 mmol/L (ref 98–110)
Creatinine: 1.54 mg/dL — ABNORMAL HIGH (ref 0.55–1.30)
Glucose: 124 mg/dL — ABNORMAL HIGH (ref 70–110)
Potassium: 4.2 mmol/L (ref 3.6–5.2)
Sodium: 141 mmol/L (ref 135–146)
eGFRcr: 43 mL/min/{1.73_m2} — ABNORMAL LOW (ref >=60)

## 2020-10-03 LAB — LIPID PANEL
Cholesterol: 208 mg/dL — ABNORMAL HIGH (ref <=200)
HDL cholesterol: 52 mg/dL (ref >=40)
LDL cholesterol, calculated: 117 mg/dL (ref 0–130)
Triglycerides: 197 mg/dL — ABNORMAL HIGH (ref <=150)

## 2020-10-03 LAB — BLOOD BANK HOLD TUBE

## 2020-10-03 MED ORDER — clopidogrel (Plavix) tablet 75 mg
75 | Freq: Every day | ORAL | Status: DC
Start: 2020-10-03 — End: 2020-10-10
  Administered 2020-10-03 – 2020-10-09 (×7): 75 mg via ORAL

## 2020-10-03 MED FILL — GABAPENTIN 300 MG CAPSULE: 300 300 mg | ORAL | Qty: 2

## 2020-10-03 MED FILL — LABETALOL 100 MG TABLET: 100 100 mg | ORAL | Qty: 1

## 2020-10-03 MED FILL — ATORVASTATIN 40 MG TABLET: 40 40 mg | ORAL | Qty: 2

## 2020-10-03 MED FILL — ASPIRIN 81 MG TABLET,DELAYED RELEASE: 81 81 mg | ORAL | Qty: 1

## 2020-10-03 MED FILL — CLOPIDOGREL 75 MG TABLET: 75 75 mg | ORAL | Qty: 1

## 2020-10-03 MED FILL — DOCUSATE SODIUM 100 MG CAPSULE: 100 100 mg | ORAL | Qty: 1

## 2020-10-03 MED FILL — PANTOPRAZOLE 20 MG TABLET,DELAYED RELEASE: 20 20 mg | ORAL | Qty: 1

## 2020-10-03 NOTE — Care Plan (Signed)
 Problem: Adult Inpatient Plan of Care  Goal: Plan of Care Review  Outcome: Ongoing, Progressing  Goal: Patient-Specific Goal (Individualized)  Outcome: Ongoing, Progressing  Goal: Absence of Hospital-Acquired Illness or Injury  Outcome: Ongoing, Progressing  Goal: Optimal Comfort and Wellbeing  Outcome: Ongoing, Progressing  Goal: Readiness for Transition of Care  Outcome: Ongoing, Progressing     Problem: Pain Acute  Goal: Acceptable Pain Control and Functional Ability  Outcome: Ongoing, Progressing     Problem: Fall Injury Risk  Goal: Absence of Fall and Fall-Related Injury  Outcome: Ongoing, Progressing   Goal Outcome Evaluation:

## 2020-10-03 NOTE — Other (Signed)
 Patient Education   Table of Contents       Ischemic Stroke     To view videos and all your education online visit,   [email or web address]   or scan this QR code with your smartphone.                    Ischemic Stroke        An ischemic stroke (cerebrovascular accident, or CVA) is the sudden death of brain tissue that occurs when an area of the brain does not get enough blood flow. This condition is a medical emergency that must be treated right away. An ischemic stroke can cause permanent loss of brain function. Losing brain function can cause problems with how different parts of the body work.     What are the causes?    This condition is caused by a decrease of blood flow to an area of the brain, which may be the result of:       A small blood clot (embolus) or a buildup of plaque in the blood vessels, called atherosclerosis, that blocks blood flow in the brain.       An abnormal heart rhythm called atrial fibrillation, which sends a small blood clot to the brain.       A blocked or damaged artery in the head or neck.       Certain infections.       Inflammation of the arteries in the brain (vasculitis).     Sometimes, the cause of ischemic stroke is not known.   What increases the risk?   The following factors may make you more likely to develop this condition:   Factors that you can change        High blood pressure (hypertension) or certain other medical conditions, such as:       Heart disease.       Diabetes mellitus.       High cholesterol.       Obesity.       Sleep apnea.       Migraine headache.       Smoking cigarettes or using other tobacco products.       Physical inactivity.       Heavy alcohol use.       Use of illegal drugs, especially cocaine and methamphetamine.       Taking birth control pills, especially if you also use tobacco.     Factors that you cannot change         Being older than age 40.       History of blood clots, stroke, or mini-stroke (transient ischemic attack, TIA).        History of high blood pressure when pregnant (preeclampsia), in women.       Family history of stroke.       Sickle cell disease.       Blood clotting disorders (hypercoagulable state).     What are the signs or symptoms?    Symptoms of this condition usually develop suddenly, or you may notice them after waking from sleep. These sudden symptoms may include:       Weakness or numbness of your face, arm, or leg, especially on one side of your body.       Loss of balance or coordination.       Slurred speech, or aphasia. Aphasia is trouble speaking or trouble understanding speech or both.       Vision  changes in one or both eyes. You may have double vision, blurred vision, or loss of vision.       Dizziness or confusion.       Nausea and vomiting.       Severe headache with no known cause.     If possible, write down the exact time that you last felt like your normal self and what time your symptoms started. Tell your health care provider.   If symptoms come and go, they could be signs of a TIA (transient ischemic attack). Get help right away, even if you feel better.   How is this diagnosed?    This condition may be diagnosed based on:       Your symptoms, your medical history, and a physical exam.       CT scan of the brain.       MRI.       Imaging tests that scan blood flow (circulation) in the brain. These may be CT angiogram, MRI angiogram, or cerebral angiogram.     You may need to see a health care provider who specializes in stroke care. A stroke specialist can be seen in person or through communication using telephone or television technology (telemedicine).    You may also have other tests, including:       Electrocardiogram (ECG).       Continuous heart monitoring.       Transthoracic echocardiogram (TTE).       Transesophageal echocardiogram (TEE).       Carotid ultrasound.       Blood tests.       Sleep study to check for sleep apnea.     How is this treated?    Treatment for this condition depends  on the duration, severity, and cause of your symptoms and on the area of the brain affected. It is very important to get treatment at the first sign of stroke symptoms. Some treatments work better if they are done within 3?6 hours of the start of stroke symptoms. These initial treatments may include:       Thrombolytic medicine that is injected to dissolve the blood clot.       Treatments given directly to the affected artery to remove or dissolve the blood clot.       Medicines to control blood pressure.       Anticoagulant or antiplatelet medicines to thin the blood.      Other treatments may include:       Oxygen.       IV fluids.       Procedures to increase blood flow.     Medicines and diet changes may be used to help manage risk factors for stroke, such as diabetes, high cholesterol, and high blood pressure.   After a stroke, you may work with physical, speech, mental health, or occupational therapists to help you recover.   Follow these instructions at home:   Medicines         Take over-the-counter and prescription medicines only as told by your health care provider.      If you were told to take a medicine to thin your blood, take your medicine exactly as told, at the same time every day. This includes aspirin or an anticoagulant.       Taking too much blood-thinning medicine can cause bleeding.       If you do not take enough blood-thinning medicine, you will not be protected enough  against another stroke and other problems.      Understand the side effects of taking anticoagulant medicine. When taking this type of medicine, make sure you:       Hold pressure over any cuts for longer than usual.       Tell your dentist and other health care providers that you are taking anticoagulants before you have any procedures that may cause bleeding.       Avoid activities that could cause injury or bruising.       Wear a medical alert bracelet or carry a card that lists what medicines you take.     Eating and  drinking         Follow instructions from your health care provider about diet.       Eat healthy foods.       If your stroke affected your ability to swallow, you may need to take steps to avoid choking. These may include taking small bites when eating and eating foods that are soft or pureed.     Safety         Follow instructions from your health care team about physical activity.       Use a walker or cane as told by your health care provider.      Take steps to create a safe home environment to lower your risk of falls. These steps may include:       Having your home looked at by specialists.       Installing grab bars in the bedroom and bathroom.       Using safety equipment, such as raised toilets and a seat in the shower.     General instructions        Do not  use any products that contain nicotine or tobacco, such as cigarettes, e-cigarettes, and chewing tobacco. If you need help quitting, ask your health care provider.      If you drink alcohol:      Limit how much you use to:       0?1 drink a day for women.       0?2 drinks a day for men.           Be aware of how much alcohol is in your drink. In the [place] one drink equals one 12 oz bottle of beer (355 mL), one 5 oz glass of wine (148 mL), or one 1? oz glass of hard liquor (44 mL).         If you need help to stop using drugs or alcohol, ask your health care provider about a referral to a program or specialist.       Maintain an active and healthy lifestyle. Get regular exercise as told.       Wear a medical bracelet as told by your health care provider.       Keep all follow-up visits as told by your health care provider, including visits with all specialists on your health care team. This is important.   How is this prevented?   You can lower your risk of another stroke by managing high blood pressure, high cholesterol, diabetes, heart disease, sleep apnea, and obesity. Your risk can also be lowered by quitting smoking, limiting alcohol, and staying  physically active.   Your health care provider will continue to help you with ways to prevent short-term and long-term problems caused by stroke.   Get help right away if:  You have any symptoms of a stroke. "BE FAST"  is an easy way to remember the main warning signs of a stroke:      B - Balance  . Signs are dizziness, sudden trouble walking, or loss of balance.      E - Eyes  . Signs are trouble seeing or a sudden change in vision.      F - Face  . Signs are sudden weakness or numbness of the face, or the face or eyelid drooping on one side.      A - Arms  . Signs are weakness or numbness in an arm. This happens suddenly and usually on one side of the body.      S - Speech  . Signs are sudden trouble speaking, slurred speech, or trouble understanding what people say.      T - Time  . Time to call emergency services. Write down what time symptoms started.      You have other signs of a stroke, such as:       A sudden, severe headache with no known cause.       Nausea or vomiting.       Seizure.     These symptoms may represent a serious problem that is an emergency. Do not wait to see if the symptoms will go away. Get medical help right away. Call your local emergency services (911 in the [place] Do not drive yourself to the hospital.     Summary         An ischemic stroke (cerebrovascular accident, or CVA) is the sudden death of brain tissue that occurs when an area of the brain does not get enough blood flow.       Symptoms of this condition usually develop suddenly, or you may notice them after waking from sleep.       It is very important to get treatment at the first sign of stroke symptoms. Stroke is a medical emergency that must be treated right away.     This information is not intended to replace advice given to you by your health care provider. Make sure you discuss any questions you have with your health care provider.     Document Released: 12/18/2006Document Revised: 12/14/2020Document Reviewed:  02/21/2019     Elsevier Patient Education ? 2021 [employer]

## 2020-10-03 NOTE — Consults (Signed)
 History Of Present Illness  [name]. is a 87 y.o. male presenting with speech difficulty of several days' duration. Chart was reviewed and patient interviewed and examined. Onset of difficulty articulating and getting words out about four days ago. No palpitations, headache, limb weakness, or change in gait. No history of similar problem in the past. Has noted no difficulty with vision. Has been walking with a cane for the past few months because of balance issues which he ascribed to his advanced age; has not fallen.  In ED, CTH (reviewed) unremarkable (mild atrophy but no acute abnormalities) but MRI shows an acute left MCA distribution stroke involving the fontal operculum, superior temporal lobe, posterior insula, and parietal lobe. MRA show a high grade stenosis of the right PCA (not related to this stroke) but normal ICAs. BPs have been mildly elevated in 150-160/80 range. No atrial arrhythmias have been noted overnight.    Past Medical History  He has a past medical history of Atrial flutter (CMS/HCC), Bone cancer (CMS/HCC), CHF (congestive heart failure) (CMS/HCC), HTN (hypertension), and PC (prostate cancer) (CMS/HCC).    Surgical History  None noted  Social History  He reports that he quit smoking about 60 years ago. He has never used smokeless tobacco. He reports previous alcohol use. He reports that he does not use drugs. Has a home in [place] but has been living with family members in this area.     Allergies  Patient has no known allergies.    Medications  Medications Prior to Admission   Medication Sig Dispense Refill Last Dose   . aspirin 81 mg capsule Take 81 mg by mouth in the morning.   10/01/2020   . calcium carbonate-vitamin D3 500 mg-5 mcg (200 unit) tablet Take 1 tablet by mouth in the morning.   10/01/2020   . cyanocobalamin, vitamin B-12, (VITAMIN B-12 ORAL) 1 (one) time each day.   10/01/2020   . enzalutamide (Xtandi) 40 mg chemo capsule Take 2 capsules (80 mg total) by  mouth in the morning Take with or without food at the same time each day. Do not crush, break, or dissolve. Swallow it whole. 60 capsule 5 10/01/2020   . furosemide (Lasix) 40 mg tablet Take by mouth.   10/01/2020   . gabapentin (Neurontin) 600 mg tablet Take 600 mg by mouth if needed in the morning, at noon, and at bedtime.   Past Week   . labetalol (Normodyne) 100 mg tablet Take 0.5 tablets by mouth in the morning and at bedtime.   10/01/2020   . losartan (Cozaar) 50 mg tablet Take 50 mg by mouth in the morning.   10/01/2020   . omega-3 (Fish Oil) 300-1,000 mg capsule Take 1,000 mg by mouth 3 (three) times a week.   10/01/2020   . omeprazole (PriLOSEC) 20 mg DR capsule Take 20 mg by mouth in the morning.   10/01/2020   . loperamide (Imodium) 2 mg capsule Take 2 capsules (4 mg total) by mouth after first loose stool, then 1 capsule (2 mg total) after each loose stool. Max 8 capsules per day. (Patient not taking: Reported on 10/02/2020) 60 capsule 3 Unknown   . prochlorperazine (Compazine) 10 mg tablet Take 1 tablet (10 mg) by mouth every 6 (six) hours if needed for nausea or vomiting. (Patient not taking: Reported on 10/02/2020) 30 tablet 1 Unknown   . sennosides (Senokot) 8.6 mg tablet Take 2 tablets (17.2 mg) by mouth at bedtime. Hold for loose stool. (  Patient not taking: Reported on 10/02/2020) 90 tablet 1 Unknown     Review of Systems   Per HPI    Neurological Exam  Physical Exam   Lying in bed in NAD. He is awake, alert, and attentive. Mild speech dysfluency with occasional word substitutions that are self-corrected. Can name objects, body parts and can perform calculations in his head. Can read letters and words. Good insight.  Visual fields are full to confrontation. Pupils are 3/3 round and reactive to light. Full EOMs. No nystagmus. Face is symmetrical. Tongue protrudes in midline.  Muscle tone is normal. Power is 5/5 throughout on segmental testing. No pronator drift. No upper limb dysmetria. DTRs 1+ in upper  limbs, 0-tr in lower limbs. Bilateral flexor plantar response.    Last Recorded Vitals   Blood pressure 123/74, pulse 59, temperature 36.2 C (97.2 F), temperature source Oral, resp. rate 16, height 1.702 m, weight 79.5 kg, SpO2 97 %.    Relevant Results  Chem profile significant for creatinine of 1.54. Glucose mildly elevated (120-150). Cholesterol at presentation 217 with trigylcerides at 346 AND LDL 98.  MCV elevated but B12 level is not low.  PT/PTT not elevated. Mildly thrombocytopenic (160,000 and 126,000 upon repeat)     Assessment/Plan   He had a recent left MCA distribution stroke and has remarkably limited associated neurological deficits.  Agree with continuing ASA 81mg /day and addition of Plavix 75mg /day for 3 months pending results of evaluation. May be embolic etiology, so agree with cardiac monitoring and would advise discharge with event monitor if no paroxysms of atrial fibrillation are noted during the hospital stay. If PAF is diagnosed, then transitioning from DAPT to NOAC in 10 to 14 days would be advised.    Also recommend:  TTE to screen for possible embolic source  High dose statin (already ordered)- goal of LDL less than 70  Check HgbA1C and initiate treatment to achieve euglycemia  Speech therapy and PT assessments    Thank you for this referral. Will sign off. Please contact if any questions.

## 2020-10-03 NOTE — H&P (Signed)
 INPATIENT H&P  [facility]  [address]  [place] [address]  [id number]    Today's Date: 10/03/2020  MRN: [id number]  Name: [name].  DOB: [birth year]    Chief Complaint  Stroke     ED team initial take: [name] is a 87 y.o. male  has a past medical history of Atrial flutter (CMS/HCC), Bone cancer (CMS/HCC), CHF (congestive heart failure) (CMS/HCC), HTN (hypertension), and PC (prostate cancer) (CMS/HCC). presenting with a chief complaint of Stroke    87 year old male who presents with 4 days of new onset dysarthria.  His daughter denies any weakness or numbness.  The patient denies any vision changes, headache.  He is clearly frustrated by his difficulty speaking.    He has been on beta-blockers and this morning was a bit bradycardic, holding beta-blockers and consulting cardiology, especially given the fact that his CVA has been demonstrated    History Of Present Illness  [name]. is a 87 y.o. male presenting with 4 days of altered speech.  Patient followed by outpatient palliative care team.  He has a history of atrial flutter, not anticoagulated, receiving aspirin.  The patient's seek attention only at the time of the visit to the emergency department.    Patient seen and examined this morning.  Case discussed with the neurologist.  He is now on aspirin and Plavix.  Neurology recommends event monitor to rule out recurrent arrhythmia.  Patient with a history of cardiac ablation.    Past Medical History  He has a past medical history of Atrial flutter (CMS/HCC), Bone cancer (CMS/HCC), CHF (congestive heart failure) (CMS/HCC), HTN (hypertension), and PC (prostate cancer) (CMS/HCC).     Surgical History  He has a past surgical history that includes MRA HEAD WO CONTRAST (10/02/2020) and MRA NECK W AND WO CONTRAST (10/02/2020).     Social History  He reports that he quit smoking about 60 years ago. He has never used smokeless tobacco. He reports previous alcohol use. He  reports that he does not use drugs.     Family History  No family history on file.     Code status: Full Code    Allergies  Patient has no known allergies.    Current Outpatient Medications   Medication Instructions   . aspirin 81 mg, oral, Daily RT   . calcium carbonate-vitamin D3 500 mg-5 mcg (200 unit) tablet 1 tablet, oral, Daily   . cyanocobalamin, vitamin B-12, (VITAMIN B-12 ORAL) Daily   . enzalutamide (XTANDI) 80 mg, oral, Daily, Take with or without food at the same time each day. Do not crush, break, or dissolve. Swallow it whole.   . furosemide (Lasix) 40 mg tablet oral   . gabapentin (NEURONTIN) 600 mg, oral, 3 times daily PRN   . labetalol (Normodyne) 100 mg tablet 0.5 tablets, oral, 2 times daily   . loperamide (Imodium) 2 mg capsule Take 2 capsules (4 mg total) by mouth after first loose stool, then 1 capsule (2 mg total) after each loose stool. Max 8 capsules per day.   . losartan (COZAAR) 50 mg, oral, Daily   . omega-3 (Fish Oil) 300-1,000 mg capsule 1,000 mg, oral, 3 times weekly   . omeprazole (PRILOSEC) 20 mg, oral, Daily   . prochlorperazine (COMPAZINE) 10 mg, oral, Every 6 hours PRN   . sennosides (SENOKOT) 17.2 mg, oral, Nightly, Hold for loose stool.      IN-HOUSE MEDS:  aspirin, 81 mg, oral, Daily  atorvastatin, 80 mg, oral, Nightly  docusate sodium, 100 mg, oral, BID  gabapentin, 600 mg, oral, TID  labetalol, 0.5 tablet, oral, BID  pantoprazole, 20 mg, oral, Daily before breakfast       PRN Meds: PRN medications: acetaminophen, acetaminophen, acetaminophen, bisacodyl, loperamide, ondansetron ODT **OR** ondansetron, polyethylene glycol, [COMPLETED] Insert peripheral IV **AND** [COMPLETED] Saline lock IV **AND** sodium chloride 0.9 %    Review of Systems   Unremarkable a 12 point examination otherwise    Physical Exam   Blood pressure 108/60, pulse 54, temperature 36.6 C (97.9 F), temperature source Oral, resp. rate 12, height 1.702 m, weight 79.5 kg, SpO2 98 %.  Awake alert and oriented  x3.  Speaking in full sentences.  No real slurred speech without my visit.  HEENT within normal limits.  Neck no JVD no bruits.  Lungs are clear to auscultation and percussion.  Heart regular rate and rhythm, he has no significant rubs murmurs or gallops.  Abdomen benign.  Extremities no edema, no clubbing or cyanosis    Relevant Lab Results  Lab Results   Component Value Date    GLUCOSE 124 (H) 10/02/2020    NA 141 10/02/2020    K 4.2 10/02/2020    CO2 23 10/02/2020    CL 109 10/02/2020    BUN 24 10/02/2020    CREATININE 1.54 (H) 10/02/2020    CALCIUM 9.1 10/02/2020    ALBUMIN 3.6 10/02/2020    AST 28 10/02/2020    ALT 19 10/02/2020    BILITOT 0.6 10/02/2020    ALKPHOS 76 10/02/2020    FERRITIN 223.9 09/03/2020    INR <1.00 10/02/2020    HGBA1C 5.5 09/03/2020    CHOL 217 (H) 10/02/2020    TRIG 346 (H) 10/02/2020    TRIG 346 (H) 10/02/2020     Lab Results   Component Value Date    WBC 5.5 10/03/2020    HGB 12.0 (L) 10/03/2020    HCT 34.4 (L) 10/03/2020    MCV 100.6 (H) 10/03/2020    PLT 126 (L) 10/03/2020       XR RESULTS: Patchy left basilar atelectasis and/or consolidation.  Clarisse Gouge MD 07/01/2018 1:28 PM    Head CT shows no acute intracranial pathology identified.    Brain MRI showed left MCA infarct without hemorrhage or mass-effect.  No intracranial large vessel occlusion or aneurysm.  Likely severe/critical right posterior cerebral artery absence of flow.  C-spine MRI shows no significant stenosis    ASSESSMENT AND PLAN:  Principal Problem:    Dysarthria    Past Medical History:  #1 atrial flutter  #2 DJD  #3 GERD    #4 hypertension   #5 hyperlipidemia  #6 prostatic carcinoma  #7 chronic renal failure stage III  #8 history of cardioversion  #9 hyperlipidemia  #10 vitamin D deficiency  #11  History of fall resulting in back pain  #12 Constipation    Assessment and Plan:   #1  Slurred speech, patient will need for CVA  #2  History of flutter, rule out atrial fibrillation, status post ablation  #3  Bradycardia,  on beta-blockers plus CVA, cardio consulted  #4  Hypertension, improved this morning  #5  Hyperlipidemia, on high-dose statin now  #6 Chronic renal failure stage III creatinine 1.5 which is essentially baseline    DISCUSSION:  Carsin Randazzo. is a 87 y.o. male admitted from the emergency department with the following chief complaint of Stroke. The patient is initially seen in  triage and later by the emergency department physician. The initial working diagnosis is Dysarthria [R47.1]  Cerebrovascular accident (CVA), unspecified mechanism (CMS/HCC) [I63.9].    The initial work up shows patient presented with dysarthria.  This is now quite significant in terms of his speech impairment.  He has an unremarkable head CT, however MRI of the head demonstrated a right CVA.    As a result the initial treatment includes admission, neurology consultation, aspirin and Plavix, cardiology consultation.  Discharge on heart monitor    Initial presentation most consistent with CVA in a patient of advanced age.  Possibly cardiac, however can be a localized event just given his age.    Supporting lab work includes the following:   CBC shows minimal anemia  Chem 7 shows stable chronic renal failure stage III  Other remarkable labs include hyperlipidemia    Further work up and elaboration includes hyperlipidemia as above and COVID-19 negative.    The patient is expected to need inpatient services for less than two midnights.    The patient is not a diabetic, as a result capillary blood sugars and sliding scale not been utilized.    DVT and GI prophylaxis ordered in accordance with the patient's clinical condition.    The patient's code status is full by default    I certify that hospital inpatient services are reasonable and medically necessary. They are appropriately provided as inpatient services in accordance with the two midnight benchmark under 42CFR 412 3(e), or the services are specified as inpatient only procedure under 42  CFR 419 22(n).    Note dictated on Dragon, not reviewed line by line. If you find any typos or issues; or have any questions please text or call me at [phone]. It will be appreciated.     Benay Pike, MD

## 2020-10-03 NOTE — Progress Notes (Signed)
 Occupational Therapy Evaluation     Patient Name: [name].  MRN: [id number]  DOB: January 06, 1932  Today's Date: 10/03/2020    Subjective   HPI/Hospital Course: Pt is a 87 yo M presenting with speech difficulties. MRI shows an acute left MCA distribution stroke involving the fontal operculum, superior temporal lobe, posterior insula, and parietal lobe.     Patient Active Problem List   Diagnosis   . Prostate cancer (CMS/HCC)   . Bone metastases (CMS/HCC)   . Dysarthria       Past Medical History:   Diagnosis Date   . Atrial flutter (CMS/HCC)    . Bone cancer (CMS/HCC)    . CHF (congestive heart failure) (CMS/HCC)    . HTN (hypertension)    . PC (prostate cancer) (CMS/HCC)        Past Surgical History:   Procedure Laterality Date   . MRA HEAD WO CONTRAST  10/02/2020    MRA HEAD WO CONTRAST 10/02/2020 [facility] MRI   . MRA NECK W AND WO CONTRAST  10/02/2020    MRA NECK W AND WO CONTRAST 10/02/2020 [facility] MRI       Objective     General Info  OT Received On: 10/03/20  Following Therapy Session:: Patient in Bed, Bed Alarm Activated, Call bell within reach, Nursing Staff Aware of Patient Location  Plan of Care Reviewed With:: Patient, PT, RN/Charge Nurse  Eval Information  Type of Evaluation: Initial Evaluation         Home Living  Was Patient Admitted from STR?: No  Lives With: Daughter  Type of Home: House  Home Layout: One Level  Bathroom Shower/Tub: Pension scheme manager: Standard  Home Equipment: Single [employer]  Prior Level of Function  Information Provided By: Patient  Independent at Baseline: All Museum/gallery exhibitions officer, All Community Mobility, All ADL's  Needs Assistance with at Baseline: All IADL's (Pt family assist with laundry, cooking and cleaning)  Pre-stroke mRS: 0 - No Symptoms    Modified Rankin Scale  Current Status: 4 - Moderately severe disability - unable to walk without assistance and unable to attend to own bodily needs without assistance    Pain Assessment  Pain Assessment: No/denies pain          Cognition  Overall Cognitive Status: Impaired  Orientation Level: Oriented to place, Oriented to time, Oriented to person  Following Commands: Follows one step commands (with cueing)  Safety Judgment: Decreased Safety awareness  Awareness of Errors/Deficits: Impaired  Problem Solving: Impaired  Communication:  (difficulty word finding, sometimes using wrong words)  Behavioral/Affect Comments: Calm and cooperative  Cognition Comments: Pt frequently repeated commands prior to completing asking for clarification.    AMPAC - (Daily Activity Inpatient Short Form) How much help from another person do you currently need?  Putting on and taking off regular lower body clothing?: 2- A Lot  Bathing (including washing, rinsing, drying)?: 3 - A Little  Toileting, which includes using toilet,bedpan or urinal?: 3 - A Little  Putting on and taking off regular upper body clothing?: 4 - None  Taking care of personal grooming such as brushing teeth?: 4 - None  Eating meals?: 4 - None  Raw Score: 20         PROM Right Upper Extremity  Overall RUE PROM: WFL  PROM Left Upper Extremity  Overall LUE PROM: WFL    Strength Right Upper Extremity  RUE Overall Strength:  (Grip 4+/5)  Strength Left Upper Extremity  LUE  Overall Strength:  (Grip 4+/5)         Neuromuscular Assessment  Coordination: Impaired  Functional Coordination: Impaired (increased time to donn socks, dropping socks and repositioning in hands)  Serial Opposition:  (slow but accurate)  Finger to nose: Impaired (BUE inaccurate)  Sensation: WNL (reports BUE intact to light touch)  Vision: Comments (Pt wearing bifocals, when asked if needs to waer at all times pt said only for reading. When asked if needed tow ear throughout assessment pt said yes at all times)  Smooth Pursuits: Impaired (Pt unable to follow commands to complete tracking)  Bed Mobility  Bed Position: HOB elevated, Use of bedrail  Supine to Sit: Supervision  Sit to Supine: Supervision  Transfers  Sit to Stand:  Contact Guard  Stand to Sit: Engineering geologist  Distance: Household distances  Assistance Level: Administrator, sports Device: Single Point Cane  Comments: Unsteady with mobility, one episode of knee buckling. Pt veering to left side with mobility.  ADL  Lower Body Dressing: Contact guard (for balance with increase time to doff/donn socks)    Education  Education Provided: Role of OT/Plan of Care, Discharge Planning, ADL Training, Adaptive Equipment/Assistive Device, Development worker, community, Safety Awareness/Fall Risk  Education Provided to: Patient  Learning Evaluation: Needs practice    Assessment   Pt received in bed, agreeable to OT evaluation. Pt demonstrating decreased coordination, balance and difficulty with word finding throughout. Patient presents with ADL/Self care deficit, Functional Mobility deficit, Impaired safety awareness, Balance deficit, Impaired cognition, Impaired coordination limiting safe participation in daily tasks. Pt would benefit from rehab to increase safety and independence with ADLs and functional mobility.       Goals       Transfer Goal 1 (OT)  Activity/Assistive Device (Transfer Goal 1, OT): transfers, all  Independence Level/Cues Needed (Transfer Goal 1, OT): supervision required  Time Frame (Transfer Goal 1, OT): 3 days         Dressing Goal 1 (OT)  Activity/Device (Dressing Goal 1, OT): dressing skills, all  Independence/Cues Needed (Dressing Goal 1, OT): supervision required  Time Frame (Dressing Goal 1, OT): 3 days         Grooming Goal 1 (OT)  Activity/Device (Grooming Goal 1, OT): oral care (including container management)  Independence (Grooming Goal 1, OT): supervision required  Time Frame (Grooming Goal 1, OT): 3 days           Plan   Plan: Continued skilled Inpatient OT services   Treatment Interventions: ADLs/Self Care Training, Patient/Caregiver Education, Functional Mobility, Teacher, early years/pre, Paediatric nurse,  Engineer, civil (consulting), Insurance claims handler, Naval architect  OT Frequency and Duration: 3-5 x/week until Discharge  Treatment:      Recommendations  Anticipate Discharge to: Rehab    Ebbie Ridge, OT  [place] lic # [id number]

## 2020-10-03 NOTE — Progress Notes (Signed)
 Follow Up Social Work Progress Note        Data: SW consult re: Stroke.       Action: Chart reviewed.   Case discussed with CM. Consulted noted to be placed in error.         Response: Per chart review, pt is a 87 y.o male with a past medical history of Atrial flutter,  Bone cancer, Congestive Heart Failure) (CMS/HCC), HTN , and Prostate cancer who presented with speech difficulty of several days' duration. Pt's daughters, [name] [phone]) and [name] [phone]) are listed as contact persons.     Pt has [employer] Medicare for insurance and his PCP  is listed as Perfecto Kingdom MD [phone].     Chart reviewed and no social needs noted. Pt noted to live with family.       Plan: No social needs identified. No further SW interventions indicated. Please re-consult if needed.

## 2020-10-03 NOTE — Progress Notes (Signed)
 Speech-Language Pathology Evaluation - Communication                                            Patient Name: [name].  MRN: [id number]  DOB: 12-Nov-1931  Evaluation Date: 10/03/2020    General Occurrence  Type of Evaluation: Cognitive-Communication Evaluation  Time spent with patient: 45  In person or Telemedicine?: In Person  SLP Received On: 10/03/20  Visit number: 1  Date of Onset: 10/02/20  Start of Care/Eval: 10/03/20  Medical Diagnosis: CVA    Visit Information  Type of Evaluation: Clinical Swallowing Evaluation  PPE Used: SLP Wore: Surgical Mask, Gloves    History  Brief Introduction: [name]. was admitted to the [facility] ED on 10/02/2020 following CVA. He presented with dysarthria and unremarkable head CT (mild atrophy but no acute abnormalities). However, MRI showed acute left MCA distribution stroke involving the fontal operculum, superior temporal lobe, posterior insula, and parietal lobe. Onset of difficulty articulating and getting words out about four days ago.    Chief Complaint/Expectation: SLP consulted for speech-language evaluation due to acute CVA with aphasic and apraxic errors in speech and language.    Medical History: Atrial flutter (CMS/HCC), Bone cancer (CMS/HCC), CHF (congestive heart failure) (CMS/HCC), HTN (hypertension), and PC (prostate cancer) (CMS/HCC).    Social History: He reports that he quit smoking about 60 years ago. He has never used smokeless tobacco. He reports previous alcohol use. He reports that he does not use drugs. Has a home in [place] but has been living with family members in this area.    Prior Level of Function: WFL. Per daughter [name] (via chart review), he is very sharp.     Current Level of Function: Pt presents with intermittent distortions and articulatory groping. Per daughter [name] (via chart review), pt has been more confused, his speech has been slurred/garbled, and he was not able to write or  recognize his written name.     Evaluation Observations: SLP entered pt's room accompanied with supervising SLP; pt asleep upon arrival. Pt easily awoke to verbal stimuli and agreeable to sit up in bed. Pt was very pleasant, engaged, and maintained alertness throughout the evaluation. Pt required intermittent repetitions of test stimuli, potentially due to difficulty hearing with SLP mask on. Pt occasionally frustrated when aware of speech errors but demonstrated good self-awareness and perseverance.     Standardized Measures  Bedside Western Aphasia Battery    Assessment  See findings and impression for full details.    Findings  Speech/Articulation:  Intermittent distorted substitutions and articulatory groping in spontaneous speech. Inconsistent articulatory errors. Increased errors with spontaneous speech and increased length/complexity (e.g., "The quick brown fox jumps over the lazy dog." as "The lady the lady jumps over the dog"). Significant difficulty repeating "buttercup" 10x. Several unintelligible words/utterances.  Oral Mechanism Exam: WFL  Voice: WFL  Auditory Comprehension: 90% accuracy answering yes/no questions independently. 80% accuracy independently following any form of command.   Cognition: Oriented x 3.  Language Expression: Fluency characterized by some hesitations and minimal word-finding difficulty. Strong, meaningful language content. 100% accuracy naming.    Pragmatics/Behavior: Appropriate.   Reading Comprehension: 100% accuracy when reading menu.  Writing: 70% accuracy. decreased writing mechanics and self-correction present.   Augmentative Communication: Not addressed this date.   Swallowing: Please see dysphagia eval and notes  for details.    Summary & Impression  Introductory Statement: [name]. is an 89-old male who was seen for a communication evaluation at bedside following acute CVA. At this time, pt presents with significant apraxia of speech characterized by  distorted substitutions, articulatory groping, and increased errors with length/complexity and spontaneous speech. Receptive and expressive language relatively intact.  He would greatly benefit from acute rehab for aphasia given acute and significant change from baseline.    Recommendations  1: Speech-language treatment 3-5x/week to address goals below.    Plan of Care  Is Skilled SLP Therapy Recommended: Yes  Prognosis: Excellent  Postive Prognosis Factors: Previous Level of Function, Patient Motivation    Frequency & Duration  Frequency of Treatment: 3-5x Per week  Duration of Treatment: 4 Weeks  Treatment Plan Start Date: 10/03/20  Treatment Plan End Date: 11/03/20    Goals  Long Term Goal 1 - Pt will improve speech articulation to make wants/needs known.  Short term goal 1a - Pt will repeat words and sentences with appropriate articulation at 80% accuracy independently.   Short term goal 1b - Pt will produce sentences in response to a question (e.g. what's your favorite holiday and why) with appropriate articulation at 80% accuracy independently.  Short term goal 1c - Pt will participate in simple and complex conversation at 80% articulatory accuracy independently.  Short term goal 1d - Pt will acknowledge and self-correct articulatory breakdowns using taught strategies with 80% accuracy independently.         Rocky Crafts, M.S. SLP-CF

## 2020-10-03 NOTE — Consults (Signed)
 Palliative Care Consult    History Of Present Illness   Palliative care is consulted for support in the setting of an advanced illness. Pt  is a 87 y.o. male admitted with speech difficulty of several days' duration. MRI showed an acute left MCA distribution stroke involving the fontal operculum, superior temporal lobe, posterior insula, and parietal lobe. MRA show a high grade stenosis of the right PCA (not related to this stroke) but normal ICAs.     Past Medical History   has a past medical history of Atrial flutter (CMS/HCC), Bone cancer (CMS/HCC), CHF (congestive heart failure) (CMS/HCC), HTN (hypertension), and PC (prostate cancer) (CMS/HCC).    Surgical History   has a past surgical history that includes MRA HEAD WO CONTRAST (10/02/2020) and MRA NECK W AND WO CONTRAST (10/02/2020).     Social History   reports that he quit smoking about 60 years ago. He has never used smokeless tobacco. He reports previous alcohol use. He reports that he does not use drugs.    PPS: 30/100% PPS prior to hospitalization:unable to assess    PCP: Perfecto Kingdom, MD      HCP: none on file    Code Status: full code    Palliative Care Discussion:     Pt is sitting in bed in NAD. He is awake and alert. He continues to have trouble with speech and struggles to provide more than 1-2 word answers. He denies pain, states he is breathing ok. He remembers meeting with palliative care yesterday.    I called his dtr [name] and left a message asking for a call back.    Allergies  Patient has no known allergies.    Medications  aspirin, 81 mg, oral, Daily  atorvastatin, 80 mg, oral, Nightly  clopidogrel, 75 mg, oral, Daily  docusate sodium, 100 mg, oral, BID  gabapentin, 600 mg, oral, TID  labetalol, 0.5 tablet, oral, BID  pantoprazole, 20 mg, oral, Daily before breakfast         PRN medications: acetaminophen, acetaminophen, acetaminophen, bisacodyl, loperamide, ondansetron ODT **OR** ondansetron, polyethylene glycol, [COMPLETED] Insert  peripheral IV **AND** [COMPLETED] Saline lock IV **AND** sodium chloride 0.9 %    Review of Systems  Unable to obtain     Physical Exam  General: elderly male sitting in bed in NAD.  HEENT:  head atraumatic, moist mucous membranes  Resp: RR regular  CV: regular  GI: SNT, +BS  Skin: warm, dry  MS: No acute abnormalities  Neuro: Alert, oriented, able to follow commands  Psych: calm, cooperative     Last Recorded Vitals  Blood pressure 123/74, pulse 59, temperature 36.2 C (97.2 F), temperature source Oral, resp. rate 16, height 1.702 m, weight 79.5 kg, SpO2 97 %.    Impression/Plan:   Palliative care is consulted for support in the setting of an advanced illness. Pt  is a 87 y.o. male admitted with speech difficulty of several days' duration. MRI showed an acute left MCA distribution stroke involving the fontal operculum, superior temporal lobe, posterior insula, and parietal lobe. MRA show a high grade stenosis of the right PCA (not related to this stroke) but normal ICAs. Pt is unable to participate in today's visit. I have a call out to his dtr [name]    Illness Understanding: Unable to assess    Goals of Care: Unable to determine. I have a call out to his dtr to discuss    Symptom Management:  Per primary team    Advanced  Directives: Pt does not have a HCP or MOLST on file     Support/Coping Skills:  Unable to assess.       total time spent: 30 minutes. More than 50% of which was coordination of care    d/w: RN, COC, hospitalist

## 2020-10-03 NOTE — Progress Notes (Signed)
 Speech Therapy Note      Patient Name: [name].  MRN: [id number]  DOB: 08/16/1931    SPEECH-LANGUAGE PATHOLOGY  INPATIENT SWALLOWING EVALUATION    Type of Session Clinical Swallowing Evaluation Visit # 1   Today's Date 10/03/20 Time Spent in Session 45   Date of Onset 10/02/20 Medical Diagnosis CVA   Start of Care 10/03/20 Treatment Diagnosis Rule-Out Dysphagia   Plan of Care Start Date 10/03/20 Plan of Care End Date 10/17/20        I.  PATIENT HISTORY   History may be obtained from a variety of sources, including EMR, paper records, discussion with staff, patient,   and family.  Brief Introduction:  Pt is an 87 year old male admitted with after 4 days of speech difficulty described as unable to get the words out. Pt found to have a left MCA ischemic infarction.    Reason for Referral:  Rule out aspiration    Medical and Surgery History:  #1 atrial flutter  #2 DJD  #3 GERD    #4 hypertension   #5 hyperlipidemia  #6 prostatic carcinoma  #7 chronic renal failure stage III  #8 history of cardioversion  #9 hyperlipidemia  #10 vitamin D deficiency  #11  History of fall resulting in back pain  #12 Constipation    Social / Educational History:  Lives at home with family    Prior Swallowing History:  Pt reported no difficulty with p.o.    Diet and Liquid Consistency PRIOR to admission:  Regular Consistency solids, Thin liquid    CXR and Other Relevant Diagnostic Information:  MRI shows 1.  Left MCA territory likely acute ischemic infarct without hemorrhage or mass effect. Less likely ictal related seizure edema or encephalitis.  2.  No intracranial large vessel occlusion or aneurysm.  3.  Absent mid to to distal right posterior cerebral artery flow related enhancement, likely severe/critical stenosis and less likely occlusion given the absence of acute or prior infarct.  RN reports: RN reports no difficulty with p.o. noted    PPE: Surgical Mask, Gloves        II.  EXAMINATION       A.  Current Status  Follows Simple  Commands Functionally: Yes  Expresses Needs Functionally: Yes (See communication evaluation.)  Vision Functional : Yes  Hearing Functional for Conversation: Yes  Respiratory Modality: Room Air       B.  Oral Mechanism  Dentition: Edentulous, Upper dentures only  Labial Strength: WFL  Facial Symmetry: WFL  Lingual Strength: WFL  Oral Cavity: WFL       C.  Consistencies Trialed  L0: Thin Liquids: Juice via straw  L4: Pureed/Extremely Thick: Applesauce  Regular: Graham cracker        III.  ASSESSMENT       A.  INTRODUCTION AND OBSERVATIONS:  Pt was awake and alert in bed when SLP entered room. Pt positioned upright in bed. Please see communication evaluation for communication status. Pt willingly trialed all p.o. presented.       B.  ORAL PHASE  Adequate labial seal with no anterior bolus loss. Adequate lingual strength and ROM. Pt with upper dentures only, no lower teeth. Pt reported he has eaten with this dentition for years. Mastication for solids adequate. Adequate oral transit time. No oral residue observed.       C. PHARYNGEAL PHASE  Swallow trigger appeared timely. Laryngeal elevation appeared [facility]. No cough and clear voice after all p.o. trials.  No s/sx of aspiration observed.      D.  OVERALL SUMMARY & IMPRESSIONS  Overall, pt presented with adequate oral and pharyngeal phases of swallow. No s/sx of aspiration observed. SLP will f/u x1-2 for diet tolerance.           IV.  RECOMMENDATIONS  Recommended Solid Consistency: Regular Consistency  Recommended Liquid Consistency: L0: Thin Liquids  Administer Liquids Via: Per patient preference  Administer Medications (with MD/PharmD Approval): Regular Pills as Tolerated  Positional Techniques: As Upright as Possible  Supervision: Assist with Feeding if Needed  Mouthcare: Mouthcare per MD Discretion  Modified Barium Swallow Recommendations: Does not appear to be clinically indicated at this time     Education  Results, Recommendations, & Plan Discussed with: Patient,  Registered Nurse  Education Provided Regarding: Signs & Symptoms of Penetration/Aspiration, Food Consistency Recommendations, Safe Swallow Strategies, Plan of Care  Method of Education: Discussion         V.  PLAN OF CARE  Is Skilled SLP Therapy Recommended: Yes  Prognosis: Excellent  Postive Prognosis Factors: Previous Level of Function, Patient Motivation  Frequency of Treatment: 2-3x Per week  Duration of Treatment: 2 Weeks        VI.  GOALS  Long Term Goal     LTG1:  Patient will maintain adequate hydration/nutrition with optimum safety and efficiency of swallowing function on P.O. intake without overt signs and symptoms of aspiration for the highest appropriate diet level      Short Term Objective     STG1:  Pt will eat prescribed diet without oral pocketing and pharyngeal struggle.              PLAN for Next Session    SLP will continue to follow for ongoing diet texture tolerance and education as appropriate.         Redmond Pulling, MS, CCC-SLP  [place] lic # [id number]
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
## 2020-10-03 NOTE — Progress Notes (Signed)
 Physical Therapy Evaluation    Patient Name: [name].  MRN: [id number]  DOB: [birth year]  Evaluation Date: 10/03/2020    Subjective   HPI/Hospital Course:   Pt continues to demonstrate expressive aphasia, has difficulty finding words at times. States that PTA he was indep with most activities.     Patient Active Problem List   Diagnosis   . Prostate cancer (CMS/HCC)   . Bone metastases (CMS/HCC)   . Dysarthria       Past Medical History:   Diagnosis Date   . Atrial flutter (CMS/HCC)    . Bone cancer (CMS/HCC)    . CHF (congestive heart failure) (CMS/HCC)    . HTN (hypertension)    . PC (prostate cancer) (CMS/HCC)        Past Surgical History:   Procedure Laterality Date   . MRA HEAD WO CONTRAST  10/02/2020    MRA HEAD WO CONTRAST 10/02/2020 [facility] MRI   . MRA NECK W AND WO CONTRAST  10/02/2020    MRA NECK W AND WO CONTRAST 10/02/2020 [facility] MRI       Objective     General Info  PT Received On: 10/03/20  Following Therapy Session:: Call bell within reach, Patient in Bed, Patient in Care of Staff Member  Plan of Care Reviewed With:: Patient, OT, RN/Charge Nurse  Recommended mobility with nursing staff: A x 1 with cane  Eval Information  Type of Evaluation: Initial Evaluation         Home Living  Was Patient Admitted from STR?: No  Lives With: Daughter  Type of Home: House  Home Layout: One Level  Number of Stairs to Enter Home: 0  Number of Stairs Within Home: 0  Home Equipment: Single [employer]  Prior Level of Function  Information Provided By: Patient  Independent at Baseline: All Museum/gallery exhibitions officer, All Community Mobility, All ADL's/IADL's, With Device  History of Recent Falls: No    Vital Signs  Pulse: 57  Oxygen Therapy  SpO2: 96 %    Cognition  Orientation Level: Oriented to person, Oriented to place, Oriented to time    Strength Right Lower Extremity  RLE Overall Strength: WFL    Bed Mobility  Supine to Sit: Supervision  Sit to Supine: Supervision  Transfers  Sit to Stand: Contact Guard  Stand to Sit: Patent attorney (Feet): 100  Assistance Level: Contact Guard, Minimal Assist  Assistive Device: Single Point Cane  Gait Characteristics: Pt observed to be unsteady with ambulation, once instance of knee buckling noted. Pt somewhat inattentive to L side of environment as well.    Balance  Standing Balance - Static: Fair- with cane  AMPAC - (Basic Mobility Inpatient Short Form) How much help from another person do you currently need?  Turning from your back to your side while in a flat bed without using bedrails?: 4 - None  Moving from lying on your back to sitting on the side of a flat bed without using bedrails?: 4 - None  Moving to and from a bed to a chair (including a wheelchair)?: 3 - A little  Standing up from a chair using your arms (e.g., wheelchair, or bedside chair)?: 3 - A little  To walk in hospital room?: 3 - A little  Climbing 3-5 steps with a railing?: 3 - A little  Raw Score: 20    Education  Education Provided: Role of PT/Plan of Care, Discharge Planning, Adaptive Equipment/Assistive Device, Development worker, community,  Safety Awareness/Fall Risk  Education Provided to: Patient  Barriers to learning: Acuity of illness  Learning Evaluation: Needs practice    Assessment   Pt demonstrates some instability with ambulation at this time with one instance of significant knee buckling noted. Pt is also noted to be inattentive to L side of body and visual field and has difficulty following more complex commands at times. Pt not safe to DC home without 24/7 supervision/assist at this time, will need rehab to address deficits.     Patient presents with Impaired gait, Functional Mobility deficit, Impaired safety awareness, Balance deficit, Strength deficit       Prognosis: Good       PT Goals       Transfer Goal 1 (PT)  Activity/Assistive Device (Transfer Goal 1, PT): sit-to-stand/stand-to-sit, bed-to-chair/chair-to-bed  Independence Level/Cues Needed (Transfer Goal 1, PT): modified independence  Time Frame  (Transfer Goal 1, PT): 1 week  Progress/Outcome (Transfer Goal 1, PT): goal ongoing    Gait Training Goal 1 (PT)  Activity/Assistive Device (Gait Training Goal 1, PT): gait (walking locomotion), assistive device use  Independence Level (Gait Training Goal 1, PT): modified independence  Distance (Gait Training Goal 1, PT): 1=200  Time Frame (Gait Training Goal 1, PT): 1 week  Progress/Outcome (Gait Training Goal 1, PT): goal ongoing    Stairs Goal 1 (PT)  Activity/Assistive Device (Stairs Goal 1, PT): ascending stairs, descending stairs  Independence Level/Cues Needed (Stairs Goal 1, PT): modified independence  Number of Stairs (Stairs Goal 1, PT): FOS  Time Frame (Stairs Goal 1, PT): 1 week  Progress/Outcome (Stairs Goal 1, PT): goal ongoing      Plan   Plan: Continued Skilled Inpatient PT Services   Treatment Interventions: Therapeutic exercise, Assistive device training, Funtional mobility training, Gait training, Stair training, Neuromuscular Re-education/balance  PT Frequency and Duration: 5-7 x/week until Discharge  Treatment:      Recommendations  Anticipate Discharge to: Rehab  Recommended mobility with nursing staff: A x 1 with cane    Sharion Dove, PT  [place] lic # [id number]

## 2020-10-03 NOTE — Nursing Note (Signed)
 An agitated mixture of 8 mL normal saline with 2 mL of air is injected intravenously per protocol at the request of the Cardiac Sonographer during an echocardiogram.  The bubble study may be repeated at the request of the sonographer.    A Cardiologist will interpret the echocardiogram results with the bubble study.      Number of bubble studies:2    Valsalva maneuver performed (yes/no):yes    IV access per LDA Avatar;existing    Adverse Events (yes/no):  NO  Comments:pt tolerated procedure well

## 2020-10-03 NOTE — Progress Notes (Signed)
 10/03/20 1301   Case Management Initial Assessment   Type of Residence Apartment   Lives With  Children   Support Systems Children   Current Home Treatment/Devices/Equipment  Cane   Case Management Progress Note:   Patient admitted from Palliative Care office.  He was noted to have had four days of slurred speech by daughter.  He was diagnosed with left MCA ischemic infarction.  He will have ST and PT assessments, HbA1c checked and med dosing for hypercholesterolemia.  Plan for IP Echo and OP follow up with 30 day halter monitor for evaluation of AFIB/Flutter.  PMH includes prostate and bone cancer and recent compression fracture at L2 with hypoplasty.  Called and spoke with daughter [name].  She said best contact would be sister, [name] [phone]) or brother [name] since he resides with them. Patient splits his time between apartment with 2 children in [place] and [place].  PLOF - I with I/ADLs, managed own meds and used a cane for ambulation.  The apartment he is currently staying in is a garden style with 3 STE.  His children drive him to MD visits and grocery shopping.  He did not use oxygen or BiPAP/CPAP or any types of services prior to admit.  Marisue Ivan, RN  10/03/2020  1:11 PM

## 2020-10-04 ENCOUNTER — Other Ambulatory Visit: Payer: Self-pay | Admitting: Internal Medicine

## 2020-10-04 DIAGNOSIS — N183 Chronic kidney disease, stage 3 unspecified: Secondary | ICD-10-CM | POA: Diagnosis not present

## 2020-10-04 DIAGNOSIS — J690 Pneumonitis due to inhalation of food and vomit: Secondary | ICD-10-CM | POA: Diagnosis not present

## 2020-10-04 DIAGNOSIS — C61 Malignant neoplasm of prostate: Secondary | ICD-10-CM | POA: Diagnosis not present

## 2020-10-04 DIAGNOSIS — R001 Bradycardia, unspecified: Secondary | ICD-10-CM | POA: Diagnosis not present

## 2020-10-04 DIAGNOSIS — I639 Cerebral infarction, unspecified: Secondary | ICD-10-CM | POA: Diagnosis not present

## 2020-10-04 DIAGNOSIS — R471 Dysarthria and anarthria: Secondary | ICD-10-CM | POA: Diagnosis not present

## 2020-10-04 MED ORDER — clopidogrel (Plavix) 75 mg tablet
75 | ORAL_TABLET | Freq: Every day | ORAL | 11 refills | Status: DC
Start: 2020-10-04 — End: 2021-02-27

## 2020-10-04 MED ORDER — atorvastatin (Lipitor) 80 mg tablet
80 | ORAL_TABLET | Freq: Every evening | ORAL | 11 refills | 90.00000 days | Status: DC
Start: 2020-10-04 — End: 2021-04-12

## 2020-10-04 MED ORDER — FAMOTIDINE 40 MG PO TABS: ORAL_TABLET | ORAL | 3 refills | Status: DC

## 2020-10-04 MED FILL — PANTOPRAZOLE 20 MG TABLET,DELAYED RELEASE: 20 20 mg | ORAL | Qty: 1

## 2020-10-04 MED FILL — GABAPENTIN 300 MG CAPSULE: 300 300 mg | ORAL | Qty: 2

## 2020-10-04 MED FILL — LABETALOL 100 MG TABLET: 100 100 mg | ORAL | Qty: 1

## 2020-10-04 MED FILL — DOCUSATE SODIUM 100 MG CAPSULE: 100 100 mg | ORAL | Qty: 1

## 2020-10-04 MED FILL — ATORVASTATIN 40 MG TABLET: 40 40 mg | ORAL | Qty: 2

## 2020-10-04 MED FILL — CLOPIDOGREL 75 MG TABLET: 75 75 mg | ORAL | Qty: 1

## 2020-10-04 MED FILL — ASPIRIN 81 MG TABLET,DELAYED RELEASE: 81 81 mg | ORAL | Qty: 1

## 2020-10-04 NOTE — Progress Notes (Signed)
 Sam Overbeck. is a 87 y.o. year old male history of metastatic prostate cancer recently hospitalized after a fall. History of atrial flutter s/p CV in 2017 echo at that time showed LVEF of 25%. Presented from palliative meeting with work finding difficulties and MR brain confirmed left MCA territory acute ischemic infarct without hemorrhage or mass-effect.  There was absent mid to distal right posterior cerebral arterial flow likely severe/critical stenosis and less likely occlusion.  MRA of the neck showed no cervical arterial occlusion high-grade stenosis or dissection, who is seen in follow up. Feels well today denies any shortness of breath, dizziness, chest pain, orthopnea, PND, edema or syncope.       Intake/Output Summary (Last 24 hours) at 10/04/2020 0845  Last data filed at 10/03/2020 1900  Gross per 24 hour   Intake 300 ml   Output 200 ml   Net 100 ml       CURRENT INPATIENT MEDICATIONS:  aspirin, 81 mg, oral, Daily  atorvastatin, 80 mg, oral, Nightly  clopidogrel, 75 mg, oral, Daily  docusate sodium, 100 mg, oral, BID  gabapentin, 600 mg, oral, TID  labetalol, 0.5 tablet, oral, BID  pantoprazole, 20 mg, oral, Daily before breakfast         REVIEW OF SYSTEMS: Review of Systems   All other systems reviewed and are negative.       EXAM:   Vitals:    10/04/20 0024 10/04/20 0025 10/04/20 0314 10/04/20 0810   BP:  119/74 121/55 108/72   BP Location:  Left arm Right arm Right arm   Patient Position:   Lying Lying   Pulse: 57  (!) 49 59   Resp: 14  15 12    Temp: 36.8 C (98.2 F)  36.8 C (98.3 F) 36.8 C (98.2 F)   TempSrc: Oral  Oral Oral   SpO2: 97%  99% 95%   Weight:       Height:          PHYSICAL EXAM:  Constitutional:       Appearance: Healthy appearance.   Pulmonary:      Breath sounds: Normal breath sounds.   Cardiovascular:      Normal rate. Regular rhythm.      Murmurs: There is no murmur.   Edema:     Peripheral edema absent.   Skin:     General: Skin is warm.        DATA:   === 07/01/18  ===  - Impression -  Patchy left basilar atelectasis and/or consolidation.  Clarisse Gouge MD 07/01/2018 1:28 PM     Lab Results   Component Value Date    GLUCOSE 124 (H) 10/02/2020    CALCIUM 9.1 10/02/2020    NA 141 10/02/2020    K 4.2 10/02/2020    CO2 23 10/02/2020    CL 109 10/02/2020    BUN 24 10/02/2020    CREATININE 1.54 (H) 10/02/2020     No results found for: TROPONINT   Lab Results   Component Value Date    WBC 5.5 10/03/2020    HGB 12.0 (L) 10/03/2020    HCT 34.4 (L) 10/03/2020    MCV 100.6 (H) 10/03/2020    PLT 126 (L) 10/03/2020        Telemetry: Sinus bradycardia    Echocardiogram:   There is borderline concentric left ventricular hypertrophy  Left ventricular systolic function is normal.  LVEF = 65%.  There is basal posterolateral wall mild hypokinesis.  There  is basal inferior wall moderate hypokinesis.  The right ventricle is normal in size and function.  Injection of agitated saline documented no interatrial shunt.  The mitral valve leaflets are thickened, but show no functional abnormalities.  The aortic valve is trileaflet with trace aortic regurgitation.  Pulmonary artery systolic pressure is 16 mmHg + right atrial pressure  There is no pericardial effusion.      IMP/PLAN:   1.  Left MCA distribution stroke  2.  Metastatic prostate cancer  3.  Asymptomatic sinus bradycardia  4.  History of atrial flutter requiring cardioversion in 2017, [facility]  5.  He has a history of LV systolic dysfunction in the setting of atrial flutter with LVEF of 25% 2017    Echocardiogram with normal LVEF 65%, no e/o PFO/shunt. Recommend 30-day monitor to be placed at discharge looking for evidence of atrial fibrillation/flutter which may have caused his CVA.  Per neurology recommendation he will be started on aspirin/Plavix.  However, for any evidence of repeat atrial arrhythmia, we will have to go on a direct acting anticoagulant such as Xarelto or Eliquis. Will sign off.     Follow up  at [facility] office (next [facility]) 11/20/20 @ 9:30am with [place].

## 2020-10-04 NOTE — Progress Notes (Signed)
 Speech Therapy Note      Patient Name: [name].  MRN: [id number]  DOB: January 01, 1932    SPEECH-LANGUAGE PATHOLOGY  INPATIENT SWALLOWING DAILY TREATMENT    Type of Session Individual Dysphagia Therapy Visit # 2   Today's Date 10/04/20 Time Spent in Session 30   Date of Onset 10/02/20     Start of Care 10/03/20     Plan of Care Start Date 10/03/20 Plan of Care End Date 10/17/20   Frequency 2-3x Per week Duration 2 Weeks        S.  SUBJECTIVE  Level of Alertness: Awake and alert     Positioning: Upright in bed     RN reports: RN reports no difficulty with p.o. noted              O. OBJECTIVE INFORMATION  1.   Consistencies Trialed  L0: Thin Liquids: Juice via straw  Regular: Cheree Ditto cracker          A .  ASSESSMENT  Introduction Statement: Pt awake and alert in bed when SLP entered room. Pt reports no difficulty with p.o. Pt willingly trialed all p.o. presented.  Oral Phase: Adequate oral transit time with no oral residue observed.  Pharyngeal Phase: No cough and clear voice after all p.o. trials. No s/sx of aspiration observed at bedside today.  Overall Summary & Impressions: Overall, pt presented with adequate oral and pharyngeal phases of swallow. No s/sx of aspiration observed. Pt will be d/c from skilled dysphagia tx at this time. Communication tx will continue. Please see that note for details.        P.  RECOMMENDATIONS:  Recommended Solid Consistency: CONTINUE ON, Regular Consistency  Recommended Liquid Consistency: CONTINUE ON, L0: Thin Liquids  Administer Liquids Via: Per patient preference  Administer Medications (with MD/PharmD Approval): Regular Pills as Tolerated  Positional Techniques: As Upright as Possible  Supervision: Assist with Feeding if Needed  Mouthcare: Mouthcare per MD Discretion  Modified Barium Swallow Recommendations: Does not appear to be clinically indicated at this time     Education  Results, Recommendations, & Plan Discussed with: Patient, Registered Nurse  Education Provided  Regarding: Signs & Symptoms of Penetration/Aspiration, Plan of Care  Method of Education: Discussion          PLAN for Next Session  Pt will be d/c from skilled dysphagia tx. Communication tx will continue.       Redmond Pulling, MS, CCC-SLP  [place] lic # [id number]
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
## 2020-10-04 NOTE — Progress Notes (Signed)
 Physical Therapy Treatment  Progress Note    Patient Name: [name].  MRN: [id number]  DOB: May 26, 1931  Today's Date: 10/04/2020    Subjective      Pt reports that he is feeling okay today, has not been out of bed yet today.     Objective     General Info  PT Received On: 10/04/20  Following Therapy Session:: Call bell within reach, Patient in Bed, Nursing Staff Aware of Patient Location  Plan of Care Reviewed With:: Patient, OT  Recommended mobility with nursing staff: A x 1 with cane    Pain Assessment  Pain Assessment: No/denies pain    Vital Signs  BP: 116/82  Oxygen Therapy  SpO2: 98 %    Orientation Level: Oriented to person, Oriented to place, Oriented to time    Bed Mobility  Supine to Sit: Supervision  Sit to Supine: Supervision  Transfers  Sit to Stand: Contact Guard  Stand to Sit: Cabin crew (Feet): 120  Assistance Level: Minimal Assist  Assistive Device: Single Point Cane  Gait Characteristics: Continues to be unsteady with poor attentiveness to L visiual field, does not respond to stimuli on L while walking.    Balance  Standing Balance - Static: Fair- with cane  AMPAC - (Basic Mobility Inpatient Short Form) How much help from another person do you currently need?  Turning from your back to your side while in a flat bed without using bedrails?: 4 - None  Moving from lying on your back to sitting on the side of a flat bed without using bedrails?: 4 - None  Moving to and from a bed to a chair (including a wheelchair)?: 3 - A little  Standing up from a chair using your arms (e.g., wheelchair, or bedside chair)?: 3 - A little  To walk in hospital room?: 3 - A little  Climbing 3-5 steps with a railing?: 3 - A little  Raw Score: 20    Standing Therapeutic Exercises  Exercises:  (Rhomberg, sharpened Rhomberg with UE support)    Education  Education Provided: Mobility Training, Safety Awareness/Fall Risk, Adaptive Equipment/Assistive Device, Discharge Planning  Education Provided  to: Patient  Teaching Method: Demonstration, Discussion  Barriers to learning: Acuity of illness  Learning Evaluation: Needs practice    Assessment   Pt continues to demonstrate decreased balance and stability with functional mobility as well as decreased attentiveness to L side of visual field. Has difficulty with balance exercises and requires B UE assist to prevent LOB with all of these activities. Pt will benefit from IP rehab at DC to improve function.     PT Goals    Transfer Goal 1 (PT)  Activity/Assistive Device (Transfer Goal 1, PT): sit-to-stand/stand-to-sit, bed-to-chair/chair-to-bed  Independence Level/Cues Needed (Transfer Goal 1, PT): modified independence  Time Frame (Transfer Goal 1, PT): 1 week  Progress/Outcome (Transfer Goal 1, PT): goal ongoing    Gait Training Goal 1 (PT)  Activity/Assistive Device (Gait Training Goal 1, PT): gait (walking locomotion), assistive device use  Independence Level (Gait Training Goal 1, PT): modified independence  Distance (Gait Training Goal 1, PT): 1=200  Time Frame (Gait Training Goal 1, PT): 1 week  Progress/Outcome (Gait Training Goal 1, PT): goal ongoing    Stairs Goal 1 (PT)  Activity/Assistive Device (Stairs Goal 1, PT): ascending stairs, descending stairs  Independence Level/Cues Needed (Stairs Goal 1, PT): modified independence  Number of Stairs (Stairs Goal 1, PT): FOS  Time Frame (  Stairs Goal 1, PT): 1 week  Progress/Outcome (Stairs Goal 1, PT): goal ongoing        Plan   Plan: Continued Skilled Inpatient PT Services   Treatment Interventions: Therapeutic exercise, Assistive device training, Funtional mobility training, Gait training, Stair training, Neuromuscular Re-education/balance  PT Frequency and Duration: 5-7 x/week until Discharge    Recommendations  Anticipate Discharge to: Rehab  Recommended mobility with nursing staff: A x 1 with cane    Sharion Dove, PT  [place] lic # [id number]

## 2020-10-04 NOTE — Progress Notes (Signed)
 Speech Therapy Note      Patient Name: [name].  MRN: [id number]  DOB: 12-27-1931    Subjective: [name] was in bed upon SLP arrival; he was on his phone. [name] was pleasant and engaged throughout the duration of the session. He asked appropriate questions and demonstrated understanding of therapy goals and objectives. At one point when discussing his house in [place] and friend, he became tearful; however, this did not impact his overall affect significantly.     Objective:  Long Term Goal 1 - Pt will improve speech articulation to make wants/needs known.  Short term goal 1a - Pt will repeat words and sentences with appropriate articulation at 80% accuracy independently. 60% accuracy. Difficulty transitioning between alveolar and velar stops (/t/ and /k/) during diadochokinetic tasks (puh, tuh, kuh) and repeating "buttercup" and "desktop." Demonstrated some perseverations and weak retention with delay.  Short term goal 1b - Pt will produce sentences in response to a question (e.g. what's your favorite holiday and why) with appropriate articulation at 80% accuracy independently. 70% accuracy when discussing favorite foods, [place], family, and friends.  Short term goal 1c - Pt will participate in simple and complex conversation at 80% articulatory accuracy independently. 60% accuracy.  Short term goal 1d - Pt will acknowledge and self-correct articulatory breakdowns using taught strategies with 80% accuracy independently.  Self-corrected 3x with 80% accuracy independently.     Assessment:  Overall, [name] appeared have increased articulatory precision today versus previous attempts at therapy. He demonstrates good self-awareness and attempts to self-correct. Perseverations and several reformulations during spontaneous conversation may suggest evidence of aphasia; however, no word finding difficulties observed during today's session. ? Mild cognitive deficits, as [name] had difficulty unlocking  phone albeit remembering password.    Education: SLP reiterated importance of slowing down speech and repeating utterances if listener does not understand.      Plan:  Recommend skilled speech therapy at acute rehab. 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     Plan of Care  Is Skilled SLP Therapy Recommended: Yes  Prognosis: Excellent  Postive Prognosis Factors: Previous Level of Function, Patient Motivation    Frequency & Duration  Frequency of Treatment: 3-5x Per week  Duration of Treatment: 4 Weeks  Treatment Plan Start Date: 10/03/20  Treatment Plan End Date: 11/03/20     Therapy Time   30 minutes    Rocky Crafts, M.S. CF-SLP

## 2020-10-04 NOTE — Discharge Summary (Addendum)
 Discharge Diagnosis, updated today and I was the first  #1 dysarthria  #2 CVA  #3 hypertension, medications adjusted  #4 history of systolic CHF ejection fraction of about 30%, back to 65%  #5 Chronic renal failure stage III, serum creatinine 1.5 baseline  #6 Hyperlipidemia, on high-dose statin  #7 History of atrial flutter, status post cardioversion, discharged on event monitor  #8 Metastatic prostatic carcinomatosis, follow-up with his urologist  #9 Mild bradycardia on low-dose beta-blockers    Hospital Course  This 87 year old male came in with discharge of 4 days duration.  He was documented to have a CVA by MRI.  The patient also has hyperlipidemia for which he is on high-dose statin.    Seen by cardiology given previous arrhythmia history.  This was an atrial flutter he is status post cardioversion.  He was not chronically anticoagulated as a result.  New echocardiogram shows resolution of previous ejection fraction related problems.    Is found to be hyperlipidemic and he is on a high dose statin for that.  He has been on aspirin 81 mg p.o. once a day Plavix has been added.  Neurology recommends to continue this combination at least for the next 3 months.    Test Results Pending At Discharge  Nothing pending at this time    Pertinent Physical Exam At Time of Discharge  Physical Exam blood pressure 98.2 pulse 51 respirations 12 blood pressure 116/82 he is awake alert and oriented x3.  Speaking in full sentences.  No JVD.  Lungs are clear.  Heart regular rate and rhythm.  Abdomen is benign.  Extremities no edema.    Chem-7 is essentially normal except for a creatinine 1.5.  Total cholesterol was 210 with an LDL of 120.  CBC has a white cell count of 5000 hematocrit 35% and platelets 126,000.    No other acute problems found on this admission.  He is ready for discharge today    Issues Requiring Follow-Up  Follow-up on his event monitor results    Outpatient Follow-Up  Future Appointments   Date Time Provider  Department Center   10/15/2020 10:30 AM Laurelyn Sickle, NP [facility]   11/20/2020  9:30 AM Sangita Lucendia Herrlich, NP [facility]     Patient to be followed at the rehab unit by Dr. Tasia Catchings.    The patient was kept in the house over the weekend and waiting for disposition and insurance approval.  Absolutely no new medical complaints or issues found    -Patient was scheduled to be discharged last week.  He was kept in the house because of insurance issues.  - He went on to develop fever.  Diagnosed with a pneumonia.'s been treated with vancomycin and Zosyn which should be continued for the next 5 days.  -Because of technical limitations Zosyn and vancomycin cannot be actually appropriately added to the discharge med rec  -Patient going to acute care, discussed with the unit liaison who discussed the case with the medical director.  In the event the blood cultures are positive they are willing to have the means to continue care for this patient.  -They did obtain a bed for this patient to be transferred to acute rehab today and we will delay the discharge they are concerned about losing this option

## 2020-10-04 NOTE — Progress Notes (Signed)
 Occupational Therapy Treatment       Patient Name: [name].  MRN: [id number]  Treatment Date: 10/04/2020    Subjective   Pt received in bed, agreeable to OT treatment session.    Patient Active Problem List   Diagnosis   . Prostate cancer (CMS/HCC)   . Bone metastases (CMS/HCC)   . Dysarthria       Past Medical History:   Diagnosis Date   . Atrial flutter (CMS/HCC)    . Bone cancer (CMS/HCC)    . CHF (congestive heart failure) (CMS/HCC)    . HTN (hypertension)    . PC (prostate cancer) (CMS/HCC)        Past Surgical History:   Procedure Laterality Date   . MRA HEAD WO CONTRAST  10/02/2020    MRA HEAD WO CONTRAST 10/02/2020 [facility] MRI   . MRA NECK W AND WO CONTRAST  10/02/2020    MRA NECK W AND WO CONTRAST 10/02/2020 [facility] MRI       Objective     General Info  OT Received On: 10/04/20  Following Therapy Session:: Patient in Bed, Bed Alarm Activated, Call bell within reach  Plan of Care Reviewed With:: Patient, PT         Modified Rankin Scale  Current Status: 4 - Moderately severe disability - unable to walk without assistance and unable to attend to own bodily needs without assistance    Pain Assessment  Pain Assessment: No/denies pain         Cognition  Overall Cognitive Status: Impaired  Orientation Level: Oriented to person, Oriented to place, Disoriented to time (After cueing oriented to season and month of August)  Following Commands: Follows one step commands (with cueing)  Communication:  (Difficulty with word finding and  unintelligible words throughout)  Cognition Treatment Details: Unpon arrival pt finishing breakfast, noted to be reading reciept then used as napkin. When cued to use napkin in front of him on table pt using napkin appropriately. Pt given tooth brush, accurately bringing to mouth for demonstration without cueing.         ROM/Strength  ROM/Strength Progress Update:: Following commands with BUE, increased cueing         Bed Mobility  Bed Position: HOB elevated, Use of bedrail  Supine to  Sit: Supervision  Sit to Supine: Supervision  Transfers  Sit to Stand: Contact Guard  Stand to Sit: Armed forces training and education officer  Distance: Household distances  Assistance Level: Minimal Assist  Assistive Device: Single [employer]  Comments: continues tobe unsteady  ADL  Grooming:  (Pt putting tooth paste on tooth brush and container management without assist.)         Education  Education Provided: Role of OT/Plan of Care, Discharge Planning, ADL Training, Adaptive Equipment/Assistive Device, Development worker, community, Safety Awareness/Fall Risk  Education Provided to: Patient  Teaching Method: Discussion  Learning Evaluation: Needs practice      Assessment   Pt continues to demonstrate decreased balance during mobility requiring Min A. Pt with speech difficulties throughout. Pt is motivated to participate in therapy. Continue to recommend rehab to increase safety and independence with ADLs and functional mobility.    Goals       Transfer Goal 1 (OT)  Activity/Assistive Device (Transfer Goal 1, OT): transfers, all  Independence Level/Cues Needed (Transfer Goal 1, OT): supervision required  Time Frame (Transfer Goal 1, OT): 3 days  Progress/Outcome (Transfer Goal 1, OT): goal ongoing  Dressing Goal 1 (OT)  Activity/Device (Dressing Goal 1, OT): dressing skills, all  Independence/Cues Needed (Dressing Goal 1, OT): supervision required  Time Frame (Dressing Goal 1, OT): 3 days  Progress/Outcome (Dressing Goal 1, OT): goal ongoing         Grooming Goal 1 (OT)  Activity/Device (Grooming Goal 1, OT): oral care (including container management)  Independence (Grooming Goal 1, OT): supervision required  Time Frame (Grooming Goal 1, OT): 3 days  Progress/Outcome (Grooming Goal 1, OT): goal ongoing           Plan   Plan: Continued skilled Inpatient OT services   Treatment Interventions: ADLs/Self Care Training, Patient/Caregiver Education, Functional Mobility, Teacher, early years/pre, Paediatric nurse, Engineer, civil (consulting), Insurance claims handler, Naval architect  OT Frequency and Duration: 3-5 x/week until Discharge    Recommendations  Anticipate Discharge to: Rehab    Ebbie Ridge, OT  [place] lic # [id number]

## 2020-10-04 NOTE — Care Plan (Signed)
 Problem: Adult Inpatient Plan of Care  Goal: Plan of Care Review  Outcome: Ongoing, Progressing  Goal: Patient-Specific Goal (Individualized)  Outcome: Ongoing, Progressing  Goal: Absence of Hospital-Acquired Illness or Injury  Outcome: Ongoing, Progressing  Goal: Optimal Comfort and Wellbeing  Outcome: Ongoing, Progressing  Goal: Readiness for Transition of Care  Outcome: Ongoing, Progressing     Problem: Pain Acute  Goal: Acceptable Pain Control and Functional Ability  Outcome: Ongoing, Progressing     Problem: Fall Injury Risk  Goal: Absence of Fall and Fall-Related Injury  Outcome: Ongoing, Progressing   Goal Outcome Evaluation:

## 2020-10-04 NOTE — Progress Notes (Signed)
 Case Management Progress Note:      10/03/20 1301   Case Management Initial Assessment   Type of Residence Apartment   Lives With  Children   Support Systems Children   Current Home Treatment/Devices/Equipment  Cane   Case Management Progress Note:   Patient admitted from Palliative Care office.  He was noted to have had four days of slurred speech by daughter.  He was diagnosed with left MCA ischemic infarction.  He will have ST and PT assessments, HbA1c checked and med dosing for hypercholesterolemia.  Plan for IP Echo and OP follow up with 30 day halter monitor for evaluation of AFIB/Flutter.  PMH includes prostate and bone cancer and recent compression fracture at L2 with hypoplasty.  Called and spoke with daughter [name].  She said best contact would be sister, [name] [phone]) or brother Deniece Portela since he resides with them. Patient splits his time between apartment with 2 children in [place] and [place].  PLOF - I with I/ADLs, managed own meds and used a cane for ambulation.  The apartment he is currently staying in is a garden style with 3 STE.  His children drive him to MD visits and grocery shopping.  He did not use oxygen or BiPAP/CPAP or any types of services prior to admit.  Marisue Ivan, RN  10/04/2020  11:44 AM    10/04/20 Case Management  Returned phone call from dtr, [name], regarding next steps for treatment of her father.  Explained that d/c is hoped to Acute Rehab pending bed availability and insurance coverage.  Dtr would prefer placement as close as possible to [place] where both she and her siblings reside.  Referrals were made to [facility], [facility] and [facility].  Explained that if no beds were available at this level or if insurance coverage not possible, we would refer to SNF.  Met with patient at bed side and explained plan and he is in agreement.  Marisue Ivan, RN  10/04/2020  11:48 AM

## 2020-10-05 DIAGNOSIS — R471 Dysarthria and anarthria: Secondary | ICD-10-CM | POA: Diagnosis not present

## 2020-10-05 DIAGNOSIS — I639 Cerebral infarction, unspecified: Secondary | ICD-10-CM | POA: Diagnosis not present

## 2020-10-05 DIAGNOSIS — J690 Pneumonitis due to inhalation of food and vomit: Secondary | ICD-10-CM | POA: Diagnosis not present

## 2020-10-05 DIAGNOSIS — N183 Chronic kidney disease, stage 3 unspecified: Secondary | ICD-10-CM | POA: Diagnosis not present

## 2020-10-05 MED FILL — ASPIRIN 81 MG TABLET,DELAYED RELEASE: 81 81 mg | ORAL | Qty: 1

## 2020-10-05 MED FILL — LABETALOL 100 MG TABLET: 100 100 mg | ORAL | Qty: 1

## 2020-10-05 MED FILL — GABAPENTIN 300 MG CAPSULE: 300 300 mg | ORAL | Qty: 2

## 2020-10-05 MED FILL — DOCUSATE SODIUM 100 MG CAPSULE: 100 100 mg | ORAL | Qty: 1

## 2020-10-05 MED FILL — ATORVASTATIN 40 MG TABLET: 40 40 mg | ORAL | Qty: 2

## 2020-10-05 MED FILL — PANTOPRAZOLE 20 MG TABLET,DELAYED RELEASE: 20 20 mg | ORAL | Qty: 1

## 2020-10-05 MED FILL — CLOPIDOGREL 75 MG TABLET: 75 75 mg | ORAL | Qty: 1

## 2020-10-05 NOTE — Progress Notes (Signed)
 Speech-Language Pathology Visit - Communication Daily Note      Patient Name: [name].  MRN: [id number]  DOB: 01-29-1932    Subjective: SLP f/u with pt for communication treatment. Pt was awake, alert, and seated upright in bed upon SLP arrival. Pt was pleasant and engaged throughout the duration of the session.  ?  Objective:  Long Term Goal 1 - Pt will improve speech articulation to make wants/needs known.  Short term goal 1a - Pt will repeat words and sentences with appropriate articulation at 80% accuracy independently. 100% accuracy at the single word level, 85% accuracy at the phrase level, 55% accurate at the sentence level independently. Demonstrated some perseverations and weak retention with delay.  Short term goal 1b - Pt will produce sentences in response to a question (e.g. what?s your favorite holiday and why) with appropriate articulation at 80% accuracy independently. 70% accuracy when discussing his wife and their history independently.  Short term goal 1c - Pt will participate in simple and complex conversation at 80% articulatory accuracy independently. 65% accuracy.  Short term goal 1d - Pt will acknowledge and self-correct articulatory breakdowns using taught strategies with 80% accuracy independently.??Self-corrected with 75% accuracy independently.  ?  Assessment: While today's session was this writer's first encounter with this pt, [name] was almost entirely intelligible and was able to participate in spontaneous conversation. [name] demonstrated almost perfect repetition of single words and short phrases independently. His articulatory breakdowns occur primarily at the sentence level at this time. He continues to demonstrate good self-awareness and attempts to self-correct. Suspect that pt also had a mild expressive aphasia based on perseverations, reformulations of sentences, and word finding difficulties observed spontaneously throughout today's session. Despite this, he is almost  always able to get his message across. He benefited from cues to formulate his sentences in his head first, reduce his rate of speech, and to restart a sentence if needed.    Education: SLP educated pt on speech strategies and apraxia of speech.  ?  Plan:?Recommend skilled speech therapy at acute rehab. 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  ?  Plan of Care  Is Skilled SLP Therapy Recommended: Yes  Prognosis: Excellent  Postive Prognosis Factors: Previous Level of Function, Patient Motivation    Frequency & Duration  Frequency of Treatment:?3-5x?Per week  Duration of Treatment:?4?Weeks  Treatment Plan Start Date: 10/03/20  Treatment Plan End Date: 11/03/20  ?  Therapy Time: 45 Minutes    Argentina Donovan, MS, CCC-SLP  [place] License No. [id number]

## 2020-10-05 NOTE — Progress Notes (Signed)
 Physical Therapy Treatment  Progress Note    Patient Name: [name].  MRN: [id number]  DOB: 1931-09-02  Today's Date: 10/05/2020    Subjective      Pt continues to have difficulty articulating speaking, notes he has been stuttering more.     Objective     General Info  PT Received On: 10/05/20  Following Therapy Session:: Call bell within reach, Patient in Bed, Patient in Care of Staff Member  Plan of Care Reviewed With:: Patient, OT  Recommended mobility with nursing staff: A x 1 with cane    Oxygen Therapy  SpO2: 96 %  Oxygen Therapy: None (Room air)    Orientation Level: Oriented to person, Oriented to situation, Oriented to place, Disoriented to time  Following Commands: Follows one step commands  Awareness of Errors/Deficits: Impaired  Problem Solving: Impaired    Bed Mobility  Supine to Sit: Contact Guard  Sit to Supine: Supervision  Transfers  Sit to Stand: Contact Guard  Stand to Sit: Cabin crew (Feet): 75  Assistance Level: Minimal Assist  Assistive Device: Single Point Cane  Gait Characteristics: Increased instability noted today, noted to deviation to R and is inattentive to L. Narrow BOS as well.    Balance  Standing Balance - Static: Fair- with cane  Standing Balance - Dynamic: Poor+ with cane  AMPAC - (Basic Mobility Inpatient Short Form) How much help from another person do you currently need?  Turning from your back to your side while in a flat bed without using bedrails?: 4 - None  Moving from lying on your back to sitting on the side of a flat bed without using bedrails?: 4 - None  Moving to and from a bed to a chair (including a wheelchair)?: 3 - A little  Standing up from a chair using your arms (e.g., wheelchair, or bedside chair)?: 3 - A little  To walk in hospital room?: 3 - A little  Climbing 3-5 steps with a railing?: 3 - A little  Raw Score: 20    Education  Education Provided: Mobility Training, Adaptive Equipment/Assistive Device, Safety Awareness/Fall  Risk  Education Provided to: Patient  Teaching Method: Demonstration, Discussion  Barriers to learning: Acuity of illness  Learning Evaluation: Needs practice    Assessment   Pt continues to demonstrate decreased stability with ambulation, decreased safety awareness noted today as well with multiple LOB noted during ambulation due to increased deviation and weakness. Noted to have increased difficulty with standing activity today, pt will require rehab at DC due to decreased safety with mobility.     PT Goals       Transfer Goal 1 (PT)  Activity/Assistive Device (Transfer Goal 1, PT): sit-to-stand/stand-to-sit, bed-to-chair/chair-to-bed  Independence Level/Cues Needed (Transfer Goal 1, PT): modified independence  Time Frame (Transfer Goal 1, PT): 1 week  Progress/Outcome (Transfer Goal 1, PT): goal ongoing    Gait Training Goal 1 (PT)  Activity/Assistive Device (Gait Training Goal 1, PT): gait (walking locomotion), assistive device use  Independence Level (Gait Training Goal 1, PT): modified independence  Distance (Gait Training Goal 1, PT): 1=200  Time Frame (Gait Training Goal 1, PT): 1 week  Progress/Outcome (Gait Training Goal 1, PT): goal ongoing    Stairs Goal 1 (PT)  Activity/Assistive Device (Stairs Goal 1, PT): ascending stairs, descending stairs  Independence Level/Cues Needed (Stairs Goal 1, PT): modified independence  Number of Stairs (Stairs Goal 1, PT): FOS  Time Frame (Stairs Goal 1, PT):  1 week  Progress/Outcome (Stairs Goal 1, PT): goal ongoing        Plan   Plan: Continued Skilled Inpatient PT Services   Treatment Interventions: Therapeutic exercise, Assistive device training, Funtional mobility training, Gait training, Stair training, Neuromuscular Re-education/balance  PT Frequency and Duration: 5-7 x/week until Discharge    Recommendations  Anticipate Discharge to: Rehab  Recommended mobility with nursing staff: A x 1 with cane    Sharion Dove, PT  [place] lic # [id number]

## 2020-10-05 NOTE — Care Plan (Signed)
 Problem: Adult Inpatient Plan of Care  Goal: Plan of Care Review  Outcome: Ongoing, Progressing  Goal: Patient-Specific Goal (Individualized)  Outcome: Ongoing, Progressing  Goal: Absence of Hospital-Acquired Illness or Injury  Outcome: Ongoing, Progressing  Goal: Optimal Comfort and Wellbeing  Outcome: Ongoing, Progressing  Goal: Readiness for Transition of Care  Outcome: Ongoing, Progressing     Problem: Pain Acute  Goal: Acceptable Pain Control and Functional Ability  Outcome: Ongoing, Progressing     Problem: Fall Injury Risk  Goal: Absence of Fall and Fall-Related Injury  Outcome: Ongoing, Progressing   Goal Outcome Evaluation:

## 2020-10-05 NOTE — Progress Notes (Signed)
 Case Management Progress Note:   Patient has been denied at all contracted SNFs with his insurance.  He has been clinically accepted at [facility] in [place] and they are seeking a one time approval from [employer].  Returned phone call to dtr, [name], about awaiting confirmation of decision from [facility] about acceptance.  Marisue Ivan, RN  10/05/2020  12:24 PM

## 2020-10-05 NOTE — Progress Notes (Signed)
 Case Management Progress Note:   Patient has been denied at all contracted SNFs with his insurance.  He has been clinically accepted at [facility] in [place] and they are seeking a one time approval from [employer].  Returned phone call to dtr, [name], about awaiting confirmation of decision from [facility] about acceptance.  Marisue Ivan, RN  10/05/2020  12:24 PM  Case Management Progress Note:   Call to [facility] contact [place] at [phone] x [phone].  She is on PTO.  I also left a VMM for her back up at [phone].  He leaves at 1630.  Called his dtr [name] and confirmed awaiting insurance authorization.  Will follow up again on Monday.  Marisue Ivan, RN  10/05/2020  4:33 PM

## 2020-10-05 NOTE — Progress Notes (Signed)
 Occupational Therapy Treatment       Patient Name: [name].  MRN: [id number]  Treatment Date: 10/05/2020    Subjective   Pt verbalizing difficulty with speech and stuttering. Pt with decreased balance this date with multiple LOB during functional mobility.    Patient Active Problem List   Diagnosis   . Prostate cancer (CMS/HCC)   . Bone metastases (CMS/HCC)   . Dysarthria       Past Medical History:   Diagnosis Date   . Atrial flutter (CMS/HCC)    . Bone cancer (CMS/HCC)    . CHF (congestive heart failure) (CMS/HCC)    . HTN (hypertension)    . PC (prostate cancer) (CMS/HCC)        Past Surgical History:   Procedure Laterality Date   . MRA HEAD WO CONTRAST  10/02/2020    MRA HEAD WO CONTRAST 10/02/2020 [facility] MRI   . MRA NECK W AND WO CONTRAST  10/02/2020    MRA NECK W AND WO CONTRAST 10/02/2020 [facility] MRI       Objective     General Info  OT Received On: 10/05/20  Following Therapy Session:: Patient in Bed, Bed Alarm Activated, Call bell within reach, Nursing Staff Aware of Patient Location  Plan of Care Reviewed With:: Patient, PT, RN/Charge Nurse         Modified Rankin Scale  Current Status: 4 - Moderately severe disability - unable to walk without assistance and unable to attend to own bodily needs without assistance    Pain Assessment  Pain Assessment: No/denies pain         Cognition  Overall Cognitive Status: Impaired  Orientation Level: Oriented to person, Oriented to situation, Oriented to place, Disoriented to time  Following Commands: Follows one step commands  Safety Judgment: Decreased Safety awareness  Awareness of Errors/Deficits: Impaired  Problem Solving: Impaired  Communication:  (Difficulty word finding and unintelligible speech)         Bed Mobility  Bed Position: HOB elevated, Use of bedrail  Supine to Sit: Contact Guard  Sit to Supine: Supervision  Scooting: Contact Guard  Transfers  Sit to Stand: Advertising account executive  Stand to Sit: Microbiologist: Minimal Assist (to land properly on  toileting, max cueing for hand placement)  Functional Mobility  Distance: Household distances  Assistance Level: Minimal Assist  Assistive Device: Single [employer]  Comments: Pt with decreased balance today with norrow base of support  ADL  Lower Body Dressing: Moderate Assist (to donn brief)  Toileting: Maximum Assist (for standing hygiene due to decreased balance)         Education  Education Provided: Role of OT/Plan of Care, Discharge Planning, ADL Training, Adaptive Equipment/Assistive Device, Development worker, community, Safety Awareness/Fall Risk  Education Provided to: Patient  Teaching Method: Demonstration, Discussion  Learning Evaluation: Needs reinforcement/further teaching, Needs practice      Assessment   Pt received in bed, continues to have difficulties with speech. Pt with decreased balance and safety awareness today, multiple LOB during mobility with limited awareness into deficits. Pt requiring increased assist with ADLs and cueing for safety with standing tasks. Continue to recommend rehab to increased safety and participation in ADLs and functional mobility.    Goals       Transfer Goal 1 (OT)  Activity/Assistive Device (Transfer Goal 1, OT): transfers, all  Independence Level/Cues Needed (Transfer Goal 1, OT): supervision required  Time Frame (Transfer Goal 1, OT): 3 days  Progress/Outcome Programmer, applications  Goal 1, OT): goal ongoing      Dressing Goal 1 (OT)  Activity/Device (Dressing Goal 1, OT): dressing skills, all  Independence/Cues Needed (Dressing Goal 1, OT): supervision required  Time Frame (Dressing Goal 1, OT): 3 days  Progress/Outcome (Dressing Goal 1, OT): goal ongoing         Grooming Goal 1 (OT)  Activity/Device (Grooming Goal 1, OT): oral care (including container management)  Independence (Grooming Goal 1, OT): supervision required  Time Frame (Grooming Goal 1, OT): 3 days  Progress/Outcome (Grooming Goal 1, OT): goal ongoing         Plan   Plan: Continued skilled Inpatient OT services    Treatment Interventions: ADLs/Self Care Training, Patient/Caregiver Education, Functional Mobility, Teacher, early years/pre, Paediatric nurse, Engineer, civil (consulting), Insurance claims handler, Naval architect  OT Frequency and Duration: 3-5 x/week until Discharge    Recommendations  Anticipate Discharge to: Rehab    Ebbie Ridge, OT  [place] lic # [id number]

## 2020-10-05 NOTE — Progress Notes (Signed)
 [facility]  SEP MED PROGRESS NOTE    Today's Date: 10/05/2020  MRN: [id number]  Name: [name].  DOB: 13-May-1931    SUBJECTIVE  Feeling well today    OBJECTIVE  Looking well    PHYSICAL EXAM  Blood pressure 113/54, pulse 52, temperature 36.6 C (97.9 F), temperature source Oral, resp. rate 15, height 1.702 m, weight 79.5 kg, SpO2 98 %.  Awake alert and conversational.  No JVD.  Lungs are clear.  Heart regular rate and rhythm.  Abdomen benign.  Extremities no edema    No intake or output data in the 24 hours ending 10/05/20 1104    CURRENT INPATIENT MEDS:  aspirin, 81 mg, oral, Daily  atorvastatin, 80 mg, oral, Nightly  clopidogrel, 75 mg, oral, Daily  docusate sodium, 100 mg, oral, BID  gabapentin, 600 mg, oral, TID  labetalol, 0.5 tablet, oral, BID  pantoprazole, 20 mg, oral, Daily before breakfast       PRN Meds:   PRN medications: acetaminophen, acetaminophen, acetaminophen, bisacodyl, loperamide, ondansetron ODT **OR** ondansetron, polyethylene glycol, [COMPLETED] Insert peripheral IV **AND** [COMPLETED] Saline lock IV **AND** sodium chloride 0.9 %      RECENT LABS  Lab Results   Component Value Date    GLUCOSE 124 (H) 10/02/2020    NA 141 10/02/2020    K 4.2 10/02/2020    CO2 23 10/02/2020    CL 109 10/02/2020    BUN 24 10/02/2020    CREATININE 1.54 (H) 10/02/2020    CALCIUM 9.1 10/02/2020    ALBUMIN 3.6 10/02/2020    AST 28 10/02/2020    ALT 19 10/02/2020    BILITOT 0.6 10/02/2020    ALKPHOS 76 10/02/2020    FERRITIN 223.9 09/03/2020    INR <1.00 10/02/2020    HGBA1C 5.5 10/03/2020    HGBA1C 5.5 10/03/2020     Lab Results   Component Value Date    WBC 5.5 10/03/2020    HGB 12.0 (L) 10/03/2020    HCT 34.4 (L) 10/03/2020    PLT 126 (L) 10/03/2020       DAILY A/P:  Borden is a 87 y.o. male admitted with Stroke  Working list of problems include Dysarthria [R47.1]  Cerebrovascular accident (CVA), unspecified mechanism (CMS/HCC) [I63.9]     The patient  has a past medical history of Atrial  flutter (CMS/HCC), Bone cancer (CMS/HCC), CHF (congestive heart failure) (CMS/HCC), HTN (hypertension), and PC (prostate cancer) (CMS/HCC).    Overall clinical status is stable   Hemodynamically stable and afebrile   Mentation wise the patient is pretty much at baseline  CV: BP is within normal limits  Blood sugars: Quite normal  Renal: Acceptable GFR with chronic renal failure stage III  Heme: Mild anemia  Pulm: No issues  Consults: Cardio neurology  I/Os as above, UO is quite acceptable  DVT Prophylaxis patient on aspirin and Plavix  GI prophylaxis on Protonix  Clinical course is stable and improved  Remarkable new tests: Nothing new  ID: No issues  Plan: Charge rehabilitation    Benay Pike, MD

## 2020-10-06 DIAGNOSIS — N183 Chronic kidney disease, stage 3 unspecified: Secondary | ICD-10-CM | POA: Diagnosis not present

## 2020-10-06 DIAGNOSIS — R471 Dysarthria and anarthria: Secondary | ICD-10-CM | POA: Diagnosis not present

## 2020-10-06 DIAGNOSIS — I639 Cerebral infarction, unspecified: Secondary | ICD-10-CM | POA: Diagnosis not present

## 2020-10-06 DIAGNOSIS — J690 Pneumonitis due to inhalation of food and vomit: Secondary | ICD-10-CM | POA: Diagnosis not present

## 2020-10-06 MED FILL — GABAPENTIN 300 MG CAPSULE: 300 300 mg | ORAL | Qty: 2

## 2020-10-06 MED FILL — DOCUSATE SODIUM 100 MG CAPSULE: 100 100 mg | ORAL | Qty: 1

## 2020-10-06 MED FILL — ASPIRIN 81 MG TABLET,DELAYED RELEASE: 81 81 mg | ORAL | Qty: 1

## 2020-10-06 MED FILL — LABETALOL 100 MG TABLET: 100 100 mg | ORAL | Qty: 1

## 2020-10-06 MED FILL — CLOPIDOGREL 75 MG TABLET: 75 75 mg | ORAL | Qty: 1

## 2020-10-06 MED FILL — ATORVASTATIN 40 MG TABLET: 40 40 mg | ORAL | Qty: 2

## 2020-10-06 MED FILL — PANTOPRAZOLE 20 MG TABLET,DELAYED RELEASE: 20 20 mg | ORAL | Qty: 1

## 2020-10-06 NOTE — Progress Notes (Signed)
 [facility]  SEP MED PROGRESS NOTE    Today's Date: 10/06/2020  MRN: [id number]  Name: [name].  DOB: 13-Oct-1931    SUBJECTIVE  No new complaints    OBJECTIVE  Awaiting for placement    PHYSICAL EXAM  Blood pressure 122/61, pulse 61, temperature 36.4 C (97.5 F), temperature source Oral, resp. rate 16, height 1.702 m, weight 79.5 kg, SpO2 98 %.  Full awake alert and oriented x3.  Speaking in full sentences.  No JVD.  Lungs are clear.  Heart regular rate and rhythm.  Abdomen is benign.  Extremities no edema    No intake or output data in the 24 hours ending 10/06/20 1249    CURRENT INPATIENT MEDS:  aspirin, 81 mg, oral, Daily  atorvastatin, 80 mg, oral, Nightly  clopidogrel, 75 mg, oral, Daily  docusate sodium, 100 mg, oral, BID  gabapentin, 600 mg, oral, TID  labetalol, 0.5 tablet, oral, BID  pantoprazole, 20 mg, oral, Daily before breakfast       PRN Meds:   PRN medications: acetaminophen, acetaminophen, acetaminophen, bisacodyl, loperamide, ondansetron ODT **OR** ondansetron, polyethylene glycol, [COMPLETED] Insert peripheral IV **AND** [COMPLETED] Saline lock IV **AND** sodium chloride 0.9 %      RECENT LABS  Lab Results   Component Value Date    GLUCOSE 124 (H) 10/02/2020    NA 141 10/02/2020    K 4.2 10/02/2020    CO2 23 10/02/2020    CL 109 10/02/2020    BUN 24 10/02/2020    CREATININE 1.54 (H) 10/02/2020    CALCIUM 9.1 10/02/2020    ALBUMIN 3.6 10/02/2020    AST 28 10/02/2020    ALT 19 10/02/2020    BILITOT 0.6 10/02/2020    ALKPHOS 76 10/02/2020    FERRITIN 223.9 09/03/2020    INR <1.00 10/02/2020    HGBA1C 5.5 10/03/2020    HGBA1C 5.5 10/03/2020     Lab Results   Component Value Date    WBC 5.5 10/03/2020    HGB 12.0 (L) 10/03/2020    HCT 34.4 (L) 10/03/2020    PLT 126 (L) 10/03/2020       DAILY A/P:  [name] is a 87 y.o. male admitted with Stroke  Working list of problems include Dysarthria [R47.1]  Cerebrovascular accident (CVA), unspecified mechanism (CMS/HCC) [I63.9]     The  patient  has a past medical history of Atrial flutter (CMS/HCC), Bone cancer (CMS/HCC), CHF (congestive heart failure) (CMS/HCC), HTN (hypertension), and PC (prostate cancer) (CMS/HCC).    Overall clinical status is stable   Hemodynamically stable and afebrile   Mentation wise the patient is quite normal  CV: BP within normal limits  Blood sugars: Very acceptable  Renal: Stable chronic renal failure stage III  Heme: No new issues  Pulm: No complaints  Consults: Followed by neurology and cardiology  I/Os as above, UO is stable  DVT Prophylaxis he is on aspirin and Plavix and he is ambulatory  GI prophylaxis on Protonix  Clinical course is stable  Remarkable new tests: Nothing new  ID: No issues  Plan: Continue to wait    Benay Pike, MD

## 2020-10-07 DIAGNOSIS — I639 Cerebral infarction, unspecified: Secondary | ICD-10-CM | POA: Diagnosis not present

## 2020-10-07 DIAGNOSIS — R471 Dysarthria and anarthria: Secondary | ICD-10-CM | POA: Diagnosis not present

## 2020-10-07 DIAGNOSIS — N183 Chronic kidney disease, stage 3 unspecified: Secondary | ICD-10-CM | POA: Diagnosis not present

## 2020-10-07 DIAGNOSIS — J690 Pneumonitis due to inhalation of food and vomit: Secondary | ICD-10-CM | POA: Diagnosis not present

## 2020-10-07 MED FILL — GABAPENTIN 300 MG CAPSULE: 300 300 mg | ORAL | Qty: 2

## 2020-10-07 MED FILL — ASPIRIN 81 MG TABLET,DELAYED RELEASE: 81 81 mg | ORAL | Qty: 1

## 2020-10-07 MED FILL — DOCUSATE SODIUM 100 MG CAPSULE: 100 100 mg | ORAL | Qty: 1

## 2020-10-07 MED FILL — PANTOPRAZOLE 20 MG TABLET,DELAYED RELEASE: 20 20 mg | ORAL | Qty: 1

## 2020-10-07 MED FILL — CLOPIDOGREL 75 MG TABLET: 75 75 mg | ORAL | Qty: 1

## 2020-10-07 MED FILL — LABETALOL 100 MG TABLET: 100 100 mg | ORAL | Qty: 1

## 2020-10-07 MED FILL — ATORVASTATIN 40 MG TABLET: 40 40 mg | ORAL | Qty: 2

## 2020-10-07 NOTE — Progress Notes (Signed)
 Case Management Progress Note:   Patient has been denied at all contracted SNFs with his insurance.  He has been clinically accepted at [facility] in [place] and they are seeking a one time approval from [employer].  Returned phone call to dtr, [name], about awaiting confirmation of decision from [facility] about acceptance.  Marisue Ivan, RN  10/05/2020  12:24 PM  Case Management Progress Note:   Call to [facility] contact [place] at [phone] x [phone].  She is on PTO.  I also left a VMM for her back up at x [phone].  He leaves at 1630.  Called his dtr [name] and confirmed awaiting insurance authorization.  Will follow up again on Monday.  Marisue Ivan, RN  10/05/2020  4:33 PM    Case Management Progress Note: [facility] inquiring whether pt would need his onc RX while in rehab facility. COC to follow up in am. Pt will need Jodie Echevaria, RN  10/07/20  3:22 PM

## 2020-10-07 NOTE — Progress Notes (Signed)
 [facility]  SEP MED PROGRESS NOTE    Today's Date: 10/07/2020  MRN: [id number]  Name: [name].  DOB: 12/02/1931    SUBJECTIVE  No new complaints    OBJECTIVE  No new clinical findings    PHYSICAL EXAM  Blood pressure 128/75, pulse 64, temperature 36.6 C (97.9 F), temperature source Oral, resp. rate 16, height 1.702 m, weight 79.5 kg, SpO2 95 %.  Examination at baseline.  Full awake alert and oriented x3.  No JVD.  Clear lungs.  Heart regular rate and rhythm.  No edema    No intake or output data in the 24 hours ending 10/07/20 1227    CURRENT INPATIENT MEDS:  aspirin, 81 mg, oral, Daily  atorvastatin, 80 mg, oral, Nightly  clopidogrel, 75 mg, oral, Daily  docusate sodium, 100 mg, oral, BID  gabapentin, 600 mg, oral, TID  labetalol, 0.5 tablet, oral, BID  pantoprazole, 20 mg, oral, Daily before breakfast       PRN Meds:   PRN medications: acetaminophen, acetaminophen, acetaminophen, bisacodyl, loperamide, ondansetron ODT **OR** ondansetron, polyethylene glycol, [COMPLETED] Insert peripheral IV **AND** [COMPLETED] Saline lock IV **AND** sodium chloride 0.9 %      RECENT LABS  Lab Results   Component Value Date    GLUCOSE 124 (H) 10/02/2020    NA 141 10/02/2020    K 4.2 10/02/2020    CO2 23 10/02/2020    CL 109 10/02/2020    BUN 24 10/02/2020    CREATININE 1.54 (H) 10/02/2020    CALCIUM 9.1 10/02/2020    ALBUMIN 3.6 10/02/2020    AST 28 10/02/2020    ALT 19 10/02/2020    BILITOT 0.6 10/02/2020    ALKPHOS 76 10/02/2020    FERRITIN 223.9 09/03/2020    INR <1.00 10/02/2020    HGBA1C 5.5 10/03/2020    HGBA1C 5.5 10/03/2020     Lab Results   Component Value Date    WBC 5.5 10/03/2020    HGB 12.0 (L) 10/03/2020    HCT 34.4 (L) 10/03/2020    PLT 126 (L) 10/03/2020       DAILY A/P:  Gemini is a 87 y.o. male admitted with Stroke  Working list of problems include Dysarthria [R47.1]  Cerebrovascular accident (CVA), unspecified mechanism (CMS/HCC) [I63.9]     The patient  has a past medical history of  Atrial flutter (CMS/HCC), Bone cancer (CMS/HCC), CHF (congestive heart failure) (CMS/HCC), HTN (hypertension), and PC (prostate cancer) (CMS/HCC).    Overall clinical status is stable   Hemodynamically stable and afebrile   Mentation wise the patient is at baseline  CV: BP within normal limits  Blood sugars: Quite acceptable  Renal: Stable creatinine  Heme: No issues  Pulm: No complaints  Consults: Already seen by neurology and cardiology  I/Os as above, UO is very acceptable  DVT Prophylaxis ambulatory  GI prophylaxis on Protonix  Clinical course is stable  Remarkable new tests: Nothing new  ID: No issues  Plan: Awaiting for disposition    Benay Pike, MD

## 2020-10-08 ENCOUNTER — Other Ambulatory Visit: Payer: Self-pay | Admitting: Internal Medicine

## 2020-10-08 ENCOUNTER — Inpatient Hospital Stay: Admit: 2020-10-08 | Payer: MEDICARE | Primary: Internal Medicine

## 2020-10-08 DIAGNOSIS — R509 Fever, unspecified: Secondary | ICD-10-CM | POA: Diagnosis not present

## 2020-10-08 DIAGNOSIS — R471 Dysarthria and anarthria: Secondary | ICD-10-CM | POA: Diagnosis not present

## 2020-10-08 DIAGNOSIS — J690 Pneumonitis due to inhalation of food and vomit: Secondary | ICD-10-CM | POA: Diagnosis not present

## 2020-10-08 DIAGNOSIS — I639 Cerebral infarction, unspecified: Secondary | ICD-10-CM | POA: Diagnosis not present

## 2020-10-08 DIAGNOSIS — N183 Chronic kidney disease, stage 3 unspecified: Secondary | ICD-10-CM | POA: Diagnosis not present

## 2020-10-08 LAB — CBC WITH DIFFERENTIAL
Basophils %: 0.3 %
Basophils Absolute: 0.01 10*3/uL (ref 0.00–0.22)
Eosinophils %: 1.1 %
Eosinophils Absolute: 0.04 10*3/uL (ref 0.00–0.50)
Hematocrit: 32.9 % — ABNORMAL LOW (ref 37.0–53.0)
Hemoglobin: 10.9 g/dL — ABNORMAL LOW (ref 13.0–17.5)
Immature Granulocytes %: 0.8 %
Immature Granulocytes Absolute: 0.03 10*3/uL (ref 0.00–0.10)
Lymphocyte %: 17.2 %
Lymphocytes Absolute: 0.63 10*3/uL — ABNORMAL LOW (ref 0.70–4.00)
MCH: 34.1 pg — ABNORMAL HIGH (ref 26.0–34.0)
MCHC: 33.1 g/dL (ref 31.0–37.0)
MCV: 102.8 fL — ABNORMAL HIGH (ref 80.0–100.0)
Monocytes %: 9.3 %
Monocytes Absolute: 0.34 10*3/uL — ABNORMAL LOW (ref 0.38–0.83)
NRBC %: 0 % (ref 0.0–0.0)
NRBC Absolute: 0 10*3/uL (ref 0.00–2.00)
Neutrophil %: 71.3 %
Neutrophils Absolute: 2.61 10*3/uL (ref 1.50–7.95)
Platelets: 124 10*3/uL — ABNORMAL LOW (ref 150–400)
RBC: 3.2 M/uL — ABNORMAL LOW (ref 4.20–5.90)
RDW-CV: 13 % (ref 11.5–14.5)
RDW-SD: 49 fL (ref 35.0–51.0)
WBC: 3.7 10*3/uL — ABNORMAL LOW (ref 4.0–11.0)

## 2020-10-08 LAB — BASIC METABOLIC PANEL
Anion Gap: 6 mmol/L (ref 3–14)
BUN: 24 mg/dL (ref 6–24)
CO2 (Bicarbonate): 20 mmol/L (ref 20–32)
Calcium: 7.7 mg/dL — ABNORMAL LOW (ref 8.5–10.5)
Chloride: 111 mmol/L — ABNORMAL HIGH (ref 98–110)
Creatinine: 1.51 mg/dL — ABNORMAL HIGH (ref 0.55–1.30)
Glucose: 143 mg/dL — ABNORMAL HIGH (ref 70–110)
Potassium: 4.4 mmol/L (ref 3.6–5.2)
Sodium: 137 mmol/L (ref 135–146)
eGFRcr: 44 mL/min/{1.73_m2} — ABNORMAL LOW (ref >=60)

## 2020-10-08 LAB — CLOSTRIDIOIDES DIFFICILE BY PCR: C diff PCR: NOT DETECTED

## 2020-10-08 MED ORDER — sodium chloride 0.9 % infusion
0.9 | INTRAVENOUS | Status: DC
Start: 2020-10-08 — End: 2020-10-10
  Administered 2020-10-08 (×2): 75 mL/h via INTRAVENOUS

## 2020-10-08 MED ORDER — piperacillin-tazobactam (Zosyn) 3.375 g in sodium chloride 0.9 % 100 mL IVPB-MBP
3.375 | Freq: Three times a day (TID) | INTRAVENOUS | Status: DC
Start: 2020-10-08 — End: 2020-10-10
  Administered 2020-10-09 (×3): 3.375 g via INTRAVENOUS

## 2020-10-08 MED ORDER — piperacillin-tazobactam (Zosyn) 3.375 g in sodium chloride 0.9 % 100 mL IVPB-MBP
3.375 | Freq: Once | INTRAVENOUS | Status: AC
Start: 2020-10-08 — End: 2020-10-09
  Administered 2020-10-09: 3.375 g via INTRAVENOUS

## 2020-10-08 MED ORDER — vancomycin 1.25 g in sodium chloride 0.9 % 250 mL IVPB
1.25 | Freq: Once | INTRAVENOUS | Status: AC
Start: 2020-10-08 — End: 2020-10-08
  Administered 2020-10-09: 01:00:00 1.25 g via INTRAVENOUS

## 2020-10-08 MED ORDER — vanco dose daily by pharmacy
Status: DC
Start: 2020-10-08 — End: 2020-10-10

## 2020-10-08 MED FILL — LABETALOL 100 MG TABLET: 100 100 mg | ORAL | Qty: 1

## 2020-10-08 MED FILL — DOCUSATE SODIUM 100 MG CAPSULE: 100 100 mg | ORAL | Qty: 1

## 2020-10-08 MED FILL — PANTOPRAZOLE 20 MG TABLET,DELAYED RELEASE: 20 20 mg | ORAL | Qty: 1

## 2020-10-08 MED FILL — ACETAMINOPHEN 325 MG TABLET: 325 325 mg | ORAL | Qty: 2

## 2020-10-08 MED FILL — LOPERAMIDE 2 MG CAPSULE: 2 2 mg | ORAL | Qty: 1

## 2020-10-08 MED FILL — ASPIRIN 81 MG TABLET,DELAYED RELEASE: 81 81 mg | ORAL | Qty: 1

## 2020-10-08 MED FILL — CLOPIDOGREL 75 MG TABLET: 75 75 mg | ORAL | Qty: 1

## 2020-10-08 MED FILL — GABAPENTIN 300 MG CAPSULE: 300 300 mg | ORAL | Qty: 2

## 2020-10-08 MED FILL — PIPERACILLIN-TAZOBACTAM 3.375 GRAM IN 100 ML NS (MINI-BAG PLUS): 3.375 3.3750 g | INTRAVENOUS | Qty: 3.38

## 2020-10-08 MED FILL — ATORVASTATIN 40 MG TABLET: 40 40 mg | ORAL | Qty: 2

## 2020-10-08 NOTE — Progress Notes (Signed)
 [facility]  SEP MED PROGRESS NOTE    Today's Date: 10/08/2020  MRN: [id number]  Name: [name].  DOB: 1931-11-25    SUBJECTIVE  No new complaints    OBJECTIVE  Looking better seems to be baseline    PHYSICAL EXAM  Blood pressure 116/55, pulse 64, temperature 36.8 C (98.3 F), resp. rate 16, height 1.702 m, weight 79.5 kg, SpO2 97 %.  Awake alert and oriented x3.  Speaking in full sentences.  No JVD.  Lung examination is unchanged.  Heart regular rate and rhythm.  Abdomen is benign.  Extremities no edema    No intake or output data in the 24 hours ending 10/08/20 1002    CURRENT INPATIENT MEDS:  aspirin, 81 mg, oral, Daily  atorvastatin, 80 mg, oral, Nightly  clopidogrel, 75 mg, oral, Daily  docusate sodium, 100 mg, oral, BID  gabapentin, 600 mg, oral, TID  labetalol, 0.5 tablet, oral, BID  pantoprazole, 20 mg, oral, Daily before breakfast       PRN Meds:   PRN medications: acetaminophen, acetaminophen, acetaminophen, bisacodyl, loperamide, ondansetron ODT **OR** ondansetron, polyethylene glycol, [COMPLETED] Insert peripheral IV **AND** [COMPLETED] Saline lock IV **AND** sodium chloride 0.9 %      RECENT LABS  Lab Results   Component Value Date    GLUCOSE 124 (H) 10/02/2020    NA 141 10/02/2020    K 4.2 10/02/2020    CO2 23 10/02/2020    CL 109 10/02/2020    BUN 24 10/02/2020    CREATININE 1.54 (H) 10/02/2020    CALCIUM 9.1 10/02/2020    ALBUMIN 3.6 10/02/2020    AST 28 10/02/2020    ALT 19 10/02/2020    BILITOT 0.6 10/02/2020    ALKPHOS 76 10/02/2020    FERRITIN 223.9 09/03/2020    INR <1.00 10/02/2020    HGBA1C 5.5 10/03/2020    HGBA1C 5.5 10/03/2020     Lab Results   Component Value Date    WBC 5.5 10/03/2020    HGB 12.0 (L) 10/03/2020    HCT 34.4 (L) 10/03/2020    PLT 126 (L) 10/03/2020       DAILY A/P:  [name] is a 87 y.o. male admitted with Stroke  Working list of problems include Dysarthria [R47.1]  Cerebrovascular accident (CVA), unspecified mechanism (CMS/HCC) [I63.9]     The patient   has a past medical history of Atrial flutter (CMS/HCC), Bone cancer (CMS/HCC), CHF (congestive heart failure) (CMS/HCC), HTN (hypertension), and PC (prostate cancer) (CMS/HCC).    Overall clinical status is stable   Hemodynamically stable and afebrile   Mentation wise the patient is completely stable  CV: BP within normal limits  Blood sugars: Nothing new  Renal: Chronic stable CRF, and recent Chem-7  Heme: Mild anemia on recent CBC  Pulm: No issues  Consults: None at this point  I/Os as above, UO is acceptable  DVT Prophylaxis on aspirin and Plavix plus ambulatory  GI prophylaxis on Protonix  Clinical course is stable  Remarkable new tests: Nothing new  ID: No issues  Plan: Hopefully to rehabilitation today    Benay Pike, MD

## 2020-10-08 NOTE — Progress Notes (Signed)
 Speech-Language Pathology Visit - Communication Daily Note      Patient Name: [name].  MRN: [id number]  DOB: 1931-08-27    Subjective: SLP f/u with pt for communication treatment. Pt was awake, alert, and seated upright in bed upon SLP arrival. Pt was pleasant and engaged throughout the duration of the session.    Objective:  Long Term Goal 1 - Pt will improve speech articulation to make wants/needs known.  Short term goal 1a - Pt will repeat words and sentences with appropriate articulation at 80% accuracy independently.[n/a today]    Short term goal 1b - Pt will produce sentences in response to a question (e.g. what's your favorite holiday and why) with appropriate articulation at 80% accuracy independently.[95% accuracy when asked to provide sentence after shown an object].    Short term goal 1c - Pt will participate in simple and complex conversation at 80% articulatory accuracy independently.[75% accuracy when discussing his family and living situation].    Short term goal 1d - Pt will acknowledge and self-correct articulatory breakdowns using taught strategies with 80% accuracy independently.[Self-corrected with 90% accuracy independently].    Assessment: [name] continues to present with articulatory breakdowns at the sentence and conversational level.  However, after given education re: slow rate and to take deep breaths when breakdowns occur, he was able to utilize this strategy fairly independently.  Slowing his rate significantly improved his ability to communicate his thoughts.    Education: SLP educated pt on speech strategies and apraxia of speech.    Plan:Recommend skilled speech therapy at acute rehab. Continue to target apraxia of speech with focus onarticulatory-kinematic approach and speech motor learning via repetition drills.    Plan of Care  Is Skilled SLP Therapy Recommended: Yes  Prognosis: Excellent  Postive Prognosis Factors: Previous Level of Function, Patient  Motivation    Frequency &Duration  Frequency of Treatment:3-5xPer week  Duration of Treatment:4Weeks  Treatment Plan Start Date: 10/03/20  Treatment Plan End Date: 11/03/20    Therapy Time: 30 Minutes  Loraine Grip, M.S., CCC-SLP  [place] lic # [id number]

## 2020-10-08 NOTE — Progress Notes (Signed)
 Physical Therapy Treatment  Progress Note    Patient Name: [name].  MRN: [id number]  DOB: 03/22/1931  Today's Date: 10/08/2020    Subjective      Patient seen for tx, updated treatment needed for insurance auth.  Patient agreeable to treatment.    Objective     General Info  PT Received On: 10/08/20  Following Therapy Session:: Patient in Bed, Call bell within reach, Bed Alarm Activated, Nursing Staff Aware of Patient Location  Plan of Care Reviewed With:: Patient, OT, RN/Charge Nurse, Case Manager  Recommended mobility with nursing staff: A x 1 with cane              Modified Rankin Scale  Current Status: 4 - Moderately severe disability - unable to walk without assistance and unable to attend to own bodily needs without assistance              Overall Cognitive Status: Impaired  Following Commands: Follows one step commands  Safety Judgment: Decreased Safety awareness  Awareness of Errors/Deficits: Impaired  Attention Span: Intact  Memory: Impaired  Problem Solving: Impaired  Communication: Slurred or unclear diction  Behavioral/Affect Comments: Patient cooperative, but vague, unaware of LOB                   Bed Mobility  Supine to Sit: Moderate Assist  Sit to Supine: Maximal Assist, X2 Assists  Comments: Patient c/o dizziness with inital sitting, BP 107/65  Transfers  Sit to Stand: Moderate Assist  Stand to Sit: Moderate Assist  Assistive Device: Single [employer]  Comments: Patient leaning posteriorly at EOB, unable to achieve standing balance.  Mod A given for balance, eventually progressed towards CG A with cane.  Patient noted to be soiled, able to stand x2-3 minutes to allow for hygiene (needed total assist for this), unaware of fluctuating balance.  Patient with sudden c/o worsening dizziness, then sudden decreased responsivenes with LEs buckling.  Did not recover in sitting, rapid and dependent return to bed. BP 75/46. RN in to assess patient at this time, aware of events  Ambulation  Comments:  Ambulation defered given patient's symptoms in standing     Of note, patient noted to have significant difficulty reaching L UE into hospital gown during change, needed hand over hand to coordinate movement and place UE in gown.  Patient unaware of coordination deficit.    Balance  Sitting Balance - Static: Good  Sitting Balance - Dynamic: Good  Standing Balance - Static: Fair- w/ cane  Standing Balance - Dynamic: Poor w/ cane  AMPAC - (Basic Mobility Inpatient Short Form) How much help from another person do you currently need?  Turning from your back to your side while in a flat bed without using bedrails?: 4 - None  Moving from lying on your back to sitting on the side of a flat bed without using bedrails?: 3 - A little  Moving to and from a bed to a chair (including a wheelchair)?: 2 - A Lot  Standing up from a chair using your arms (e.g., wheelchair, or bedside chair)?: 3 - A little  To walk in hospital room?: 2 - A Lot  Climbing 3-5 steps with a railing?: 2 - A Lot  Raw Score: 16                        Education  Education Provided: Mobility Training, Adaptive Equipment/Assistive Device, Safety Awareness/Fall Risk  Education Provided  to: Patient  Teaching Method: Demonstration, Discussion  Barriers to learning: Acuity of illness, Difficulty concentrating  Learning Evaluation: Needs reinforcement/further teaching    Assessment      Patient noted to have poor activity tolerance today, as evidenced by inability for patient to hold himself upright in a dependent position.  He continues to have poor balance and L sided neglect.  It is clear he is grossly unsteady and unsafe to mobilize on his own given balance deficits, and poor insight into these deficits.  Continue to recommend rehab when medically cleared.         PT Goals       Transfer Goal 1 (PT)  Activity/Assistive Device (Transfer Goal 1, PT): sit-to-stand/stand-to-sit, bed-to-chair/chair-to-bed  Independence Level/Cues Needed (Transfer Goal 1, PT):  modified independence  Time Frame (Transfer Goal 1, PT): 1 week  Progress/Outcome (Transfer Goal 1, PT): goal ongoing    Gait Training Goal 1 (PT)  Activity/Assistive Device (Gait Training Goal 1, PT): gait (walking locomotion), assistive device use  Independence Level (Gait Training Goal 1, PT): modified independence  Distance (Gait Training Goal 1, PT): 1=200  Time Frame (Gait Training Goal 1, PT): 1 week  Progress/Outcome (Gait Training Goal 1, PT): goal ongoing                        Stairs Goal 1 (PT)  Activity/Assistive Device (Stairs Goal 1, PT): ascending stairs, descending stairs  Independence Level/Cues Needed (Stairs Goal 1, PT): modified independence  Number of Stairs (Stairs Goal 1, PT): FOS  Time Frame (Stairs Goal 1, PT): 1 week  Progress/Outcome (Stairs Goal 1, PT): goal ongoing            Plan   Plan: Continued Skilled Inpatient PT Services   Treatment Interventions: Therapeutic exercise, Assistive device training, Funtional mobility training, Gait training, Stair training, Neuromuscular Re-education/balance  PT Frequency and Duration: 5-7 x/week until Discharge    Recommendations  Anticipate Discharge to: Acute Rehab  Recommended mobility with nursing staff: A x 1 with cane    Dahlia Bailiff, PT  [place] lic # [id number]

## 2020-10-08 NOTE — Progress Notes (Signed)
 Case Management Progress Note:   Patient has been denied at all contracted SNFs with his insurance.  He has been clinically accepted at [facility] in [place] and they are seeking a one time approval from [employer].  Returned phone call to dtr, [name], about awaiting confirmation of decision from [facility] about acceptance.  Marisue Ivan, RN  10/05/2020  12:24 PM  Case Management Progress Note:   Call to [facility] contact [place] at [phone].  She is on PTO.  I also left a VMM for her back up at x [phone].  He leaves at 1630.  Called his dtr [name] and confirmed awaiting insurance authorization.  Will follow up again on Monday.  Marisue Ivan, RN  10/05/2020  4:33 PM    Case Management Progress Note: [facility] inquiring whether pt would need his onc RX while in rehab facility. COC to follow up in am. Pt will need auth  Lillia Mountain, RN  10/07/20  3:22 PM    Case Management Progress Note:[facility] has closed out referral as [facility] is going for auth. COC following  Lillia Mountain, RN  10/08/20  9:07 AM    Rehab requesting updated PT/OT notes. Spoke w/ PT and they will see him shortly. COC following.  Esperanza Heir, RN  10/08/2020  11:10 AM

## 2020-10-08 NOTE — Progress Notes (Signed)
 Case Management Progress Note:   Patient has been denied at all contracted SNFs with his insurance.  He has been clinically accepted at [facility] in [place] and they are seeking a one time approval from [employer].  Returned phone call to dtr, [name], about awaiting confirmation of decision from [facility] about acceptance.  Marisue Ivan, RN  10/05/2020  12:24 PM  Case Management Progress Note:   Call to [facility] contact [place] at [phone] x [phone].  She is on PTO.  I also left a VMM for her back up at x [phone].  He leaves at 1630.  Called his dtr [name] and confirmed awaiting insurance authorization.  Will follow up again on Monday.  Marisue Ivan, RN  10/05/2020  4:33 PM    Case Management Progress Note: [facility] rehab inquiring whether pt would need his onc RX while in rehab facility. COC to follow up in am. Pt will need auth  Lillia Mountain, RN  10/07/20  3:22 PM    Case Management Progress Note:[facility] has closed out referral as [facility] is going for auth. COC following  Lillia Mountain, RN  10/08/20  9:07 AM    Rehab requesting updated PT/OT notes. Spoke w/ PT and they will see him shortly. COC following.  Esperanza Heir, RN  10/08/2020  11:10 AM    Case Management Progress Note: B/P dropped to 75 sys with PT. Pt to receive IVF, not cleared for d/c today. COC following  Lillia Mountain, RN  10/08/20  12:27 PM

## 2020-10-08 NOTE — Progress Notes (Signed)
 Occupational Therapy Treatment       Patient Name: [name].  MRN: [id number]  Treatment Date: 10/08/2020    Subjective   HPI/Hospital Course: Pt agreeable to OT services, motivated to get OOB and work with therapy.     Dx: CVA    Patient Active Problem List   Diagnosis   . Prostate cancer (CMS/HCC)   . Bone metastases (CMS/HCC)   . Dysarthria       Past Medical History:   Diagnosis Date   . Atrial flutter (CMS/HCC)    . Bone cancer (CMS/HCC)    . CHF (congestive heart failure) (CMS/HCC)    . HTN (hypertension)    . PC (prostate cancer) (CMS/HCC)        Past Surgical History:   Procedure Laterality Date   . MRA HEAD WO CONTRAST  10/02/2020    MRA HEAD WO CONTRAST 10/02/2020 [facility] MRI   . MRA NECK W AND WO CONTRAST  10/02/2020    MRA NECK W AND WO CONTRAST 10/02/2020 [facility] MRI       Objective     General Info  OT Received On: 10/08/20  Following Therapy Session:: Patient in Bed, Bed Alarm Activated, Call bell within reach, Nursing Staff Aware of Patient Location  Plan of Care Reviewed With:: Patient, RN/Charge Nurse, PT              Modified Rankin Scale  Current Status: 4 - Moderately severe disability - unable to walk without assistance and unable to attend to own bodily needs without assistance    Pain Assessment  Pain Assessment: No/denies pain         Cognition  Overall Cognitive Status: Impaired  Following Commands: Follows one step commands  Safety Judgment: Decreased Safety awareness  Awareness of Errors/Deficits: Impaired  Attention Span: Intact  Memory: Impaired  Problem Solving: Impaired  Communication: Slurred or unclear diction (difficulty with word finding)  Behavioral/Affect Comments: Vague                                  Bed Mobility  Bed Position: HOB elevated, Use of bedrail  Supine to Sit: Cues for Sequencing, Moderate Assist (cues for hand placement)  Sit to Supine: Maximal Assist, X2 Assists  Scooting: Contact Guard  Comments: Pt's BP supine 107/65.  Transfers  Sit to Stand: Moderate  Assist  Stand to Sit: Moderate Assist, X2 Assists  Comments: Pt tolerate standing ~29min for hygeine after received incontinent, with fluctuating standing balance requiring Mod-CGA. While standing pt becoming minimally responsive with eyes closing and significantly delayed responses in which pt began knee buckling and dropped cane. Pt assisted into seated EOB for safety pt continuing to close eyes and respond delayed. Pt assisted back to supine with BP 74/46. Once supine pt  responding more appropriately and pt's BP improved to 120/62. Pt repositioned for comfort in bed and therapist defering further activity at this time.RN present at end of session and made aware.  ADL  Lower Body Dressing: Maximum Assist (don underware in standing due to poor balance)  Toileting: Maximum Assist (hygeine in standing)  ADL Comments: Pt found to be incontinent during bed mobility at beginning of session in which pt assisted with hygein.  Balance  Standing Balance - Static: Fair- with cane (<5  min with Mod-CGA fluctuating)  Education  Education Provided: Role of OT/Plan of Care, Discharge Planning, ADL Training, Adaptive Equipment/Assistive Device, Development worker, community, Safety Awareness/Fall Risk  Education Provided to: Patient  Teaching Method: Demonstration, Discussion  Barriers to Learning: Acuity of illness  Learning Evaluation: Needs practice      Assessment   Pt seen for follow up OT treatment session with focus on ADLs and functional mobility. Session limited as pt becoming orthostatic in standing during ADLs requiring Total Ax2 to safely return to bed in which pt's BP was 74/46, RN notified.  Patient presents with ADL/Self care deficit, Functional Mobility deficit, Impaired safety awareness, Balance deficit, Impaired cognition, Impaired coordination limiting safe participation in daily tasks. Continue to recommend rehab to increase safety and independence with ADLs and functional mobility.    Goals        Transfer Goal 1 (OT)  Activity/Assistive Device (Transfer Goal 1, OT): transfers, all  Independence Level/Cues Needed (Transfer Goal 1, OT): supervision required  Time Frame (Transfer Goal 1, OT): 3 days  Progress/Outcome (Transfer Goal 1, OT): goal ongoing              Dressing Goal 1 (OT)  Activity/Device (Dressing Goal 1, OT): dressing skills, all  Independence/Cues Needed (Dressing Goal 1, OT): supervision required  Time Frame (Dressing Goal 1, OT): 3 days  Progress/Outcome (Dressing Goal 1, OT): goal ongoing         Grooming Goal 1 (OT)  Activity/Device (Grooming Goal 1, OT): oral care (including container management)  Independence (Grooming Goal 1, OT): supervision required  Time Frame (Grooming Goal 1, OT): 3 days  Progress/Outcome (Grooming Goal 1, OT): goal ongoing                                                                Plan   Plan: Continued skilled Inpatient OT services   Treatment Interventions: ADLs/Self Care Training, Patient/Caregiver Education, Functional Mobility, Teacher, early years/pre, Paediatric nurse, Engineer, civil (consulting), Insurance claims handler, Naval architect  OT Frequency and Duration: 3-5 x/week until Discharge    Recommendations  Anticipate Discharge to: Rehab    Jolyn Nap, OT  [place] lic # [id number]

## 2020-10-08 NOTE — Progress Notes (Signed)
 Case Management Progress Note:   Patient has been denied at all contracted SNFs with his insurance.  He has been clinically accepted at [facility] in [place] and they are seeking a one time approval from [employer].  Returned phone call to dtr, [name], about awaiting confirmation of decision from [facility] about acceptance.  Marisue Ivan, RN  10/05/2020  12:24 PM  Case Management Progress Note:   Call to [facility] contact [place] at [phone] x [phone].  She is on PTO.  I also left a VMM for her back up at x [phone].  He leaves at 1630.  Called his dtr [name] and confirmed awaiting insurance authorization.  Will follow up again on Monday.  Marisue Ivan, RN  10/05/2020  4:33 PM    Case Management Progress Note: [facility] rehab inquiring whether pt would need his onc RX while in rehab facility. COC to follow up in am. Pt will need auth  Lillia Mountain, RN  10/07/20  3:22 PM    Case Management Progress Note:[facility] has closed out referral as [facility] is going for auth. COC following  Lillia Mountain, RN  10/08/20  9:07 AM

## 2020-10-09 ENCOUNTER — Encounter

## 2020-10-09 ENCOUNTER — Inpatient Hospital Stay: Admit: 2020-10-09 | Payer: MEDICARE | Primary: Internal Medicine

## 2020-10-09 DIAGNOSIS — I517 Cardiomegaly: Secondary | ICD-10-CM | POA: Diagnosis not present

## 2020-10-09 DIAGNOSIS — N189 Chronic kidney disease, unspecified: Secondary | ICD-10-CM | POA: Diagnosis not present

## 2020-10-09 DIAGNOSIS — R2681 Unsteadiness on feet: Secondary | ICD-10-CM | POA: Diagnosis not present

## 2020-10-09 DIAGNOSIS — I639 Cerebral infarction, unspecified: Secondary | ICD-10-CM | POA: Diagnosis not present

## 2020-10-09 DIAGNOSIS — I5030 Unspecified diastolic (congestive) heart failure: Secondary | ICD-10-CM | POA: Diagnosis not present

## 2020-10-09 DIAGNOSIS — J690 Pneumonitis due to inhalation of food and vomit: Secondary | ICD-10-CM | POA: Diagnosis not present

## 2020-10-09 DIAGNOSIS — R471 Dysarthria and anarthria: Secondary | ICD-10-CM | POA: Diagnosis not present

## 2020-10-09 DIAGNOSIS — C61 Malignant neoplasm of prostate: Secondary | ICD-10-CM | POA: Diagnosis not present

## 2020-10-09 DIAGNOSIS — R1310 Dysphagia, unspecified: Secondary | ICD-10-CM | POA: Diagnosis not present

## 2020-10-09 DIAGNOSIS — I4891 Unspecified atrial fibrillation: Secondary | ICD-10-CM | POA: Diagnosis not present

## 2020-10-09 DIAGNOSIS — J129 Viral pneumonia, unspecified: Secondary | ICD-10-CM | POA: Diagnosis not present

## 2020-10-09 DIAGNOSIS — R2689 Other abnormalities of gait and mobility: Secondary | ICD-10-CM | POA: Diagnosis not present

## 2020-10-09 DIAGNOSIS — I67848 Other cerebrovascular vasospasm and vasoconstriction: Secondary | ICD-10-CM | POA: Diagnosis not present

## 2020-10-09 DIAGNOSIS — I69391 Dysphagia following cerebral infarction: Secondary | ICD-10-CM | POA: Diagnosis not present

## 2020-10-09 DIAGNOSIS — J189 Pneumonia, unspecified organism: Secondary | ICD-10-CM | POA: Diagnosis not present

## 2020-10-09 DIAGNOSIS — I63512 Cerebral infarction due to unspecified occlusion or stenosis of left middle cerebral artery: Secondary | ICD-10-CM | POA: Diagnosis not present

## 2020-10-09 DIAGNOSIS — I6789 Other cerebrovascular disease: Secondary | ICD-10-CM | POA: Diagnosis not present

## 2020-10-09 DIAGNOSIS — I69322 Dysarthria following cerebral infarction: Secondary | ICD-10-CM | POA: Diagnosis not present

## 2020-10-09 DIAGNOSIS — I69398 Other sequelae of cerebral infarction: Secondary | ICD-10-CM | POA: Diagnosis not present

## 2020-10-09 DIAGNOSIS — I130 Hypertensive heart and chronic kidney disease with heart failure and stage 1 through stage 4 chronic kidney disease, or unspecified chronic kidney disease: Secondary | ICD-10-CM | POA: Diagnosis not present

## 2020-10-09 DIAGNOSIS — N183 Chronic kidney disease, stage 3 unspecified: Secondary | ICD-10-CM | POA: Diagnosis not present

## 2020-10-09 LAB — CBC
Hematocrit: 33.6 % — ABNORMAL LOW (ref 37.0–53.0)
Hemoglobin: 11.3 g/dL — ABNORMAL LOW (ref 13.0–17.5)
MCH: 34.3 pg — ABNORMAL HIGH (ref 26.0–34.0)
MCHC: 33.6 g/dL (ref 31.0–37.0)
MCV: 102.1 fL — ABNORMAL HIGH (ref 80.0–100.0)
MPV: 9.9 fL (ref 9.1–12.4)
NRBC %: 0 % (ref 0.0–0.0)
NRBC Absolute: 0 10*3/uL (ref 0.00–2.00)
Platelets: 108 10*3/uL — ABNORMAL LOW (ref 150–400)
RBC: 3.29 M/uL — ABNORMAL LOW (ref 4.20–5.90)
RDW-CV: 13.1 % (ref 11.5–14.5)
RDW-SD: 49.4 fL (ref 35.0–51.0)
WBC: 3.4 10*3/uL — ABNORMAL LOW (ref 4.0–11.0)

## 2020-10-09 LAB — BASIC METABOLIC PANEL
Anion Gap: 5 mmol/L (ref 3–14)
BUN: 21 mg/dL (ref 6–24)
CO2 (Bicarbonate): 23 mmol/L (ref 20–32)
Calcium: 7.4 mg/dL — ABNORMAL LOW (ref 8.5–10.5)
Chloride: 113 mmol/L — ABNORMAL HIGH (ref 98–110)
Creatinine: 1.57 mg/dL — ABNORMAL HIGH (ref 0.55–1.30)
Glucose: 96 mg/dL (ref 70–110)
Potassium: 4.2 mmol/L (ref 3.6–5.2)
Sodium: 141 mmol/L (ref 135–146)
eGFRcr: 42 mL/min/{1.73_m2} — ABNORMAL LOW (ref >=60)

## 2020-10-09 LAB — URINALYSIS REFLEX TO CULTURE
Bilirubin, Ur: NEGATIVE
Blood, Ur: NEGATIVE
Glucose,Ur: NEGATIVE mg/dL
Ketones, Ur: NEGATIVE mg/dL
Leukocyte Esterase, Ur: NEGATIVE WBC/uL
Nitrite, Ur: NEGATIVE
Protein,Ur: NEGATIVE mg/dL
Specific Gravity, Ur: 1.015 (ref 1.003–1.030)
Urobilinogen, Ur: 2 — AB
pH, Ur: 6 (ref 5.0–8.0)

## 2020-10-09 LAB — VANCOMYCIN LEVEL, RANDOM: Vancomycin Random: 8.9 ug/mL — ABNORMAL LOW (ref 10.0–40.0)

## 2020-10-09 MED ORDER — vancomycin 1.5 g in sodium chloride 0.9 % 500 mL IVPB
0.9 | Freq: Once | INTRAVENOUS | Status: DC
Start: 2020-10-09 — End: 2020-10-10

## 2020-10-09 MED ORDER — sodium chloride 0.9 % piggyback 100 mL with piperacillin-tazobactam 3.375 gram recon soln 3.375 g
3.375 | Freq: Three times a day (TID) | INTRAVENOUS | 0 refills | Status: AC
Start: 2020-10-09 — End: 2020-10-14

## 2020-10-09 MED FILL — PIPERACILLIN-TAZOBACTAM 3.375 GRAM IN 100 ML NS (MINI-BAG PLUS): 3.375 3.3750 g | INTRAVENOUS | Qty: 3.38

## 2020-10-09 MED FILL — LABETALOL 100 MG TABLET: 100 100 mg | ORAL | Qty: 1

## 2020-10-09 MED FILL — VANCOMYCIN IVPB 1.25 G ADD-EASE/VIALMATE: 1.25 1.2500 g | INTRAVENOUS | Qty: 1.25

## 2020-10-09 MED FILL — PANTOPRAZOLE 20 MG TABLET,DELAYED RELEASE: 20 20 mg | ORAL | Qty: 1

## 2020-10-09 MED FILL — ASPIRIN 81 MG TABLET,DELAYED RELEASE: 81 81 mg | ORAL | Qty: 1

## 2020-10-09 MED FILL — GABAPENTIN 300 MG CAPSULE: 300 300 mg | ORAL | Qty: 2

## 2020-10-09 MED FILL — ATORVASTATIN 40 MG TABLET: 40 40 mg | ORAL | Qty: 2

## 2020-10-09 MED FILL — DOCUSATE SODIUM 100 MG CAPSULE: 100 100 mg | ORAL | Qty: 1

## 2020-10-09 MED FILL — CLOPIDOGREL 75 MG TABLET: 75 75 mg | ORAL | Qty: 1

## 2020-10-09 NOTE — Care Plan (Signed)
 Goal Outcome Evaluation:   Pt developed fever. CXR done. Pt started on vanco and zosyn. Pt tolerating well. Will continue to monitor

## 2020-10-09 NOTE — Nursing Note (Signed)
 Report given to Nurse at [facility] at 1827. Aware patient is to d.c with IV access for the purpose of IV abt.   Cont to monitor

## 2020-10-09 NOTE — Progress Notes (Signed)
 Speech-Language Pathology Visit- Communication Daily Note      Patient Name:[name].  NUU:[id number]  DOB:August 15, 1931    Subjective:SLP f/u with pt for communication treatment. Pt was awake, alert, and seated upright in bed upon SLP arrival. Pt was teary during the session; therefore, limited structured tasks were completed today.    Objective:  Long Term Goal 1 - Pt will improve speech articulation to make wants/needs known.  Short term goal 1a - Pt will repeat words and sentences with appropriate articulation at 80% accuracy independently.[n/a today]    Short term goal 1b - Pt will produce sentences in response to a question (e.g. what's your favorite holiday and why) with appropriate articulation at 80% accuracy independently.[n/a today].    Short term goal 1c - Pt will participate in simple and complex conversation at 80% articulatory accuracy independently.[75% accuracy when discussing his family and living situation].    Short term goal 1d - Pt will acknowledge and self-correct articulatory breakdowns using taught strategies with 80% accuracy independently.[Self-correctedwith 100% accuracy independently].    Assessment:[name] continues to present with articulatory breakdowns at the sentence and conversational level.  He appeared to have some reduced short term memory as he did not recall this SLP from yesterday and the education that was provided.  SLP reviewed education and he expressed understanding.  Pt was visibly upset re: his current situation and SLP attempted to provide encouragement.  He was noted to repair communication breakdowns by taking deep breaths and slowing his rate.  SLP spoke with RN re: consulting psych due to his increased sadness.  RN to speak with MD.      Education: SLP educated pt on speech strategies and apraxia of speech.  SLP also provided education re: role of psych and pt agreed that he would benefit from talking to a therapist.    Plan:Recommend  skilled speech therapy at acute rehab. Continue to target apraxia of speech with focus onarticulatory-kinematic approach and speech motor learning via repetition drills.    Plan of Care  Is Skilled SLP Therapy Recommended: Yes  Prognosis: Excellent  Postive Prognosis Factors: Previous Level of Function, Patient Motivation    Frequency &Duration  Frequency of Treatment:3-5xPer week  Duration of Treatment:4Weeks  Treatment Plan Start Date: 10/03/20  Treatment Plan End Date: 11/03/20    Therapy Time: 30 Minutes  Loraine Grip, M.S., CCC-SLP  [place] lic # [id number]

## 2020-10-09 NOTE — Progress Notes (Signed)
 [facility]  SEP MED PROGRESS NOTE    Today's Date: 10/09/2020  MRN: [id number]  Name: [name].  DOB: 1932-03-06    SUBJECTIVE  Feeling better today    OBJECTIVE  Looking better today.  No longer febrile    PHYSICAL EXAM  Blood pressure 104/60, pulse 54, temperature 37.1 C (98.7 F), temperature source Oral, resp. rate 16, height 1.702 m, weight 79.5 kg, SpO2 97 %.  Awake alert, cooperative.  No JVD.  Lungs rather clear.  Heart regular rate and rhythm.  Abdomen is benign.  Extremities no edema    Intake/Output Summary (Last 24 hours) at 10/09/2020 1121  Last data filed at 10/08/2020 1223  Gross per 24 hour   Intake 118 ml   Output --   Net 118 ml       CURRENT INPATIENT MEDS:  aspirin, 81 mg, oral, Daily  atorvastatin, 80 mg, oral, Nightly  clopidogrel, 75 mg, oral, Daily  docusate sodium, 100 mg, oral, BID  gabapentin, 600 mg, oral, TID  labetalol, 0.5 tablet, oral, BID  pantoprazole, 20 mg, oral, Daily before breakfast  piperacillin-tazobactam, 3.375 g, intravenous, q8h  vanco, , intravenous, Warfarin pharmacy dosing       PRN Meds:   PRN medications: acetaminophen, acetaminophen, acetaminophen, bisacodyl, loperamide, ondansetron ODT **OR** ondansetron, polyethylene glycol, [COMPLETED] Insert peripheral IV **AND** [COMPLETED] Saline lock IV **AND** sodium chloride 0.9 %   sodium chloride, 75 mL/hr, Last Rate: 75 mL/hr (10/08/20 1939)      RECENT LABS  Lab Results   Component Value Date    GLUCOSE 96 10/09/2020    NA 141 10/09/2020    K 4.2 10/09/2020    CO2 23 10/09/2020    CL 113 (H) 10/09/2020    BUN 21 10/09/2020    CREATININE 1.57 (H) 10/09/2020    CALCIUM 7.4 (L) 10/09/2020    ALBUMIN 3.6 10/02/2020    AST 28 10/02/2020    ALT 19 10/02/2020    BILITOT 0.6 10/02/2020    ALKPHOS 76 10/02/2020    FERRITIN 223.9 09/03/2020    INR <1.00 10/02/2020    HGBA1C 5.5 10/03/2020    HGBA1C 5.5 10/03/2020     Lab Results   Component Value Date    WBC 3.4 (L) 10/09/2020    HGB 11.3 (L) 10/09/2020    HCT 33.6  (L) 10/09/2020    PLT 108 (L) 10/09/2020       DAILY A/P:  Loys is a 87 y.o. male admitted with Stroke  Working list of problems include Dysarthria [R47.1]  Cerebrovascular accident (CVA), unspecified mechanism (CMS/HCC) [I63.9]     The patient  has a past medical history of Atrial flutter (CMS/HCC), Bone cancer (CMS/HCC), CHF (congestive heart failure) (CMS/HCC), HTN (hypertension), and PC (prostate cancer) (CMS/HCC).    Overall clinical status is stable and improved  Hemodynamically stable and afebrile, no further fevers  Mentation wise the patient is at baseline  CV: BP completely normal.  Transient isolated, false positive episode of hypotension yesterday  Blood sugars: Within normal limits  Renal: Chronic and stable CRF stage III  Heme: No leukocytosis, stable anemia 34%  Pulm: Pneumonia in the lingula region per chest x-ray  Consults: No new consultations  Teary this morning; the nurse just called me, consulted psych  I/Os as above, UO is acceptable  DVT Prophylaxis on aspirin and Plavix  GI prophylaxis on Protonix  Clinical course is improved  Remarkable new tests: Chest x-ray as above  ID:  Been treated for pneumonia  Plan: Discharge to rehab possibly today    Benay Pike, MD

## 2020-10-09 NOTE — Progress Notes (Signed)
 Case Management Progress Note:   Case Management Progress Note:   Patient has been denied at all contracted SNFs with his insurance.  He has been clinically accepted at [facility] in [place] and they are seeking a one time approval from [employer].  Returned phone call to dtr, [name], about awaiting confirmation of decision from [facility] about acceptance.  Marisue Ivan, RN  10/05/2020  12:24 PM  Case Management Progress Note:   Call to [facility] contact [place] at [phone] x [phone].  She is on PTO.  I also left a VMM for her back up at x [phone].  He leaves at 1630.  Called his dtr [name] and confirmed awaiting insurance authorization.  Will follow up again on Monday.  Marisue Ivan, RN  10/05/2020  4:33 PM    Case Management Progress Note:   Patient has been denied at all contracted SNFs with his insurance.  He has been clinically accepted at [facility] in [place] and they are seeking a one time approval from [employer].  Returned phone call to dtr, [name], about awaiting confirmation of decision from [facility] about acceptance.  Marisue Ivan, RN  10/05/2020  12:24 PM  Case Management Progress Note:   Call to [facility] contact [place] at [phone] x [phone].  She is on PTO.  I also left a VMM for her back up at x [phone].  He leaves at 1630.  Called his dtr [name] and confirmed awaiting insurance authorization.  Will follow up again on Monday.  Marisue Ivan, RN  10/05/2020  4:33 PM    Case Management Progress Note: [facility] rehab inquiring whether pt would need his onc RX while in rehab facility. COC to follow up in am. Pt will need auth  Lillia Mountain, RN  10/07/20  3:22 PM    Case Management Progress Note:[facility] Re[facility] has closed out referral as [facility] is going for auth. COC following  Lillia Mountain, RN  10/08/20  9:07 AM    Rehab requesting updated PT/OT notes. Spoke w/ PT and they will see him shortly. COC following.  Esperanza Heir, RN  10/08/2020  11:10 AM    COC note:  Request for d/c to rehab needs to be sent to [facility] [name] when  ready per Doreen Beam at [facility].  Will need current PT/OT notes showing that patient is actively working with therapy and MD discharge order.  PT/OT worked with patient today and he walked 75 feet and actively participated with therapy.  Hospitalist saw patient today and confirmed diagnosis of pneumonia.  [facility] is asking to move forward with auth request with [facility].  Awaiting MD note to submit to [facility].  Marisue Ivan, RN  10/09/2020  11:08 AM

## 2020-10-09 NOTE — Progress Notes (Signed)
 Case Management Progress Note:   Member to discharge at 2100 to [facility] [place].  He will have IV access and holter monitor in place in place at discharge.  [facility] will follow up with Dr. Doyce Para on preliminary culture results on Wed AM and determine if a PICC or midline needs to be placed for ongoing IV antibiotics.  Plan is for member to receive PT/OT and ST at Acute Rehab level of care.  Marisue Ivan, RN  10/09/2020  4:17 PM

## 2020-10-09 NOTE — Progress Notes (Signed)
 Nutrition Assessment Initial  Name: [name].  MRN: [id number]  DOB: 22-Dec-1931    Date Seen: 10/09/2020    Reason for Visit/Consult:Standards of Care    No Known Allergies  Past Medical History:   Diagnosis Date   . Atrial flutter (CMS/HCC)    . Bone cancer (CMS/HCC)    . CHF (congestive heart failure) (CMS/HCC)    . HTN (hypertension)    . PC (prostate cancer) (CMS/HCC)        Admission Diagnosis:   Dysarthria [R47.1]  Cerebrovascular accident (CVA), unspecified mechanism (CMS/HCC) [I63.9]    Current Diet order:  Dietary Orders (From admission, onward)     Start     Ordered    10/03/20 0848  Adult diet Room service; Heart Healthy  Diet effective now        Question Answer Comment   Room Service Room service    Diet type: Heart Healthy        10/03/20 0847              Percent Meals Eaten (%): 75  Nutrition: Adequate         Assessment  89 YOM pmhx atrial flutter, bone CA, CHF, HTN and prostate CA admitted w/ stroke. Speech is following. +BM 10/09/2020 (loose/watery)-Imodium given. C.diff negative. +IVF for adequate hydration. Electrolytes and glucose WNL.    Current diet is Heart healthy. Recommend liberalizing diet to Regular 2000mg  Na d/t advanced age and decreased appetite/intake. Consumed 75-100% x 2 meals documented per nrsg flow sheet. Spoke to pt, just finished lunch at time of interview. Pt reports his appetite has been decreased over the last several weeks PTA but has improved gradually throughout admit. States he has someone help him w/ the cooking and shopping at home. C/o of some diarrhea yesterday- has resolved today. Pt agreeable to [place] at dinner for optimal intake. Will continue to monitor and adjust accordingly. 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 He reports "gradual" wt loss over the last 6 months, from 190lbs to 170lbs. States UBW ~175lbs which is consistent to current wt 79.5 kg (bed scale). Wt hx shows non-significant wt loss 5% x  11 months. Will continue to monitor wt trends during admit. RD to follow per [facility].      Anthropometrics  Height: 170.2 cm  Weight: 79.5 kg  IBW (kg) (Calculated) : 66.1 kg  Weight Change: 3.1  BMI (Calculated): 27.44               Wt Readings from Last 50 Encounters:   10/02/20 79.5 kg   09/05/20 79.3 kg   08/19/20 77.1 kg   10/27/19 82.6 kg   10/21/19 84 kg   09/21/19 82.5 kg   09/13/19 82.6 kg   09/21/18 86.2 kg   09/15/18 85 kg   06/23/18 76.2 kg   12/04/17 76.2 kg   11/10/17 75.5 kg   09/29/17 75.6 kg   09/15/17 75 kg   09/08/17 74.8 kg   12/03/17 75.1 kg   08/28/17 72.6 kg   08/26/17 76 kg   08/14/17 75.1 kg   08/12/17 76.2 kg   08/27/16 74.4 kg   08/20/16 76.2 kg   10/19/15 73.8 kg       Energy Needs  Estimated Energy Needs  Height: 170.2 cm  Weight Used for Equation Calculations: 79.5 kg  IBW (kg) (Calculated) : 66.1 kg  Temp: 37.1 C (98.7 F)  Equation Utilized: kcals/kg  Mifflin- St. Jeor Equation: 1429  Kcals/kg: 20-25 kcal/kg current wt [phone] kcal/day)  Estimated Protein Needs  Method for Estimating Needs: 1.0 g/kg current BW  Total Protein Estimated Needs : 80g/day  Fluid Needs  Method for Estimating Needs: ml/kg (1 mL/kcal)                      GI assessment:  Gastrointestinal  Gastrointestinal (WDL): Within Defined Limits  Last BM Date: 10/08/20  Bowel Incontinence: Yes  Stool Appearance: Loose, Watery (c.diff negative)  Stool Color: Manson Passey    Skin/edema:  Integumentary  Integumentary (WDL): Within Defined Limits  Nutrition: Adequate  Edema  Edema: None    Meds:   sodium chloride, 75 mL/hr, Last Rate: 75 mL/hr (10/08/20 1939)       aspirin, 81 mg, oral, Daily  atorvastatin, 80 mg, oral, Nightly  clopidogrel, 75 mg, oral, Daily  docusate sodium, 100 mg, oral, BID  gabapentin, 600 mg, oral, TID  labetalol, 0.5 tablet, oral, BID  pantoprazole, 20 mg, oral, Daily before breakfast  piperacillin-tazobactam, 3.375 g, intravenous, q8h  vanco, , intravenous, Pharmacy dosing      PRN medications:  acetaminophen, acetaminophen, acetaminophen, bisacodyl, loperamide, ondansetron ODT **OR** ondansetron, polyethylene glycol, [COMPLETED] Insert peripheral IV **AND** [COMPLETED] Saline lock IV **AND** sodium chloride 0.9 %      Labs:  Lab Results   Component Value Date    GLUCOSE 96 10/09/2020    NA 141 10/09/2020    K 4.2 10/09/2020    CL 113 (H) 10/09/2020    CO2 23 10/09/2020    BUN 21 10/09/2020    CREATININE 1.57 (H) 10/09/2020    CALCIUM 7.4 (L) 10/09/2020    ALBUMIN 3.6 10/02/2020    PROT 7.5 10/02/2020    MG 2.2 09/25/2020        Malnutrition Criteria:   Pt at risk for malnutrition but does not meet criteria for malnutrition at this time. Will continue to monitor and reassess during admit.    Nutrition Diagnosis  Nutrition Diagnosis  Nutrition Diagnosis: Predicted subopt intake  Related to: decreased appetite  As Evidenced By: limited intake for several weeks per pt report    Interventions   Interventions  Nutrition Interventions: Specified foods/beverage or groups, General/healthful diet, Medical food supplements, Collaboration with other providers    Recommendations  1. Recommend liberalizing to Regular 2000mg  Na diet per pt advanced age and limited intake    2. Biochemist, clinical at [employer] and adjust accordingly    Monitoring/Evaluation   Monitor/Evaluation: PO intake, ONS acceptance, wt, labs, skin, meds    Follow Up  Next Date for Nutrition Services Follow Up: 10/16/20      Ronne Binning, RD

## 2020-10-09 NOTE — Progress Notes (Signed)
 Physical Therapy Treatment  Progress Note    Patient Name: [name].  MRN: [id number]  DOB: 03/04/1932  Today's Date: 10/09/2020    Subjective   Pertinent Medical Changes: pt with new dx PNA this date    Pt calm and cooperative, agreeable to PT session at this time.     Objective     General Info  PT Received On: 10/09/20  Following Therapy Session:: Patient in Chair, Call bell within reach, Nursing Staff Aware of Patient Location  Plan of Care Reviewed With:: Patient, RN/Charge Nurse, OT, Case Manager  Recommended mobility with nursing staff: A x 1 with cane  Medical Information  Pertinent Medical Changes: pt with new dx PNA this date              Modified Rankin Scale  Current Status: 4 - Moderately severe disability - unable to walk without assistance and unable to attend to own bodily needs without assistance              Orientation Level: Oriented X4  Following Commands: Follows one step commands  Safety Judgment: Decreased Safety awareness  Communication: Slurred or unclear diction  Cognition Treatment Details: pt impulsive at times, found standing in room, attempting to untangle self from wires, TCs/VCs needed in order to sit on chair                   Transfers  Sit to Stand: Minimal Assist  Stand to Sit: Minimal Assist  Assistive Device: Single [employer]  Comments: stands with appropriate hand placement, able to tolerate standing with min A, intermittently retropulsive  Ambulation  Distance (Feet): 75  Assistance Level: Minimal Assist  Assistive Device: Single Point Cane  Gait Characteristics: pt with L path deviation, narrow BOS, decr cadence and arm swing  Comments: Pt requires max cues to scan environment for different objects, pt typically stops walking to scan, difficulty with dynamic activities. Requires min A for static balance when scanning    Balance  Sitting Balance - Static: Good  Sitting Balance - Dynamic: Good  Standing Balance - Static: Fair- with cane  Standing Balance - Dynamic: fair  - with cane                        Education  Education Provided: Mobility Training, Adaptive Equipment/Assistive Device, Safety Awareness/Fall Risk  Education Provided to: Patient  Teaching Method: Demonstration, Discussion  Barriers to learning: Acuity of illness, Difficulty concentrating  Learning Evaluation: Needs practice    Assessment   Pt seen by PT this date for progression of functional mobility. Able to progress mobility at this time given improved vital sign response to mobility. BP sitting 124/69, decr to 104/64 with static standing, but this improves with LE therex- MIP and few bouts of amb, BP incr to 116/63. Able to progress ambulation distance this date but continues to require max VCs for obstacle negotiation and visual scanning given L neglect. He would benefit from d/c to acute inpatient rehab in order to maximize PT/OT/SLP services received. Nurse updated, will continue to follow.     PT Goals       Transfer Goal 1 (PT)  Activity/Assistive Device (Transfer Goal 1, PT): sit-to-stand/stand-to-sit, bed-to-chair/chair-to-bed  Independence Level/Cues Needed (Transfer Goal 1, PT): modified independence  Time Frame (Transfer Goal 1, PT): 1 week  Progress/Outcome (Transfer Goal 1, PT): good progress toward goal    Gait Training Goal 1 (PT)  Activity/Assistive Device (Gait  Training Goal 1, PT): gait (walking locomotion), assistive device use  Independence Level (Gait Training Goal 1, PT): modified independence  Distance (Gait Training Goal 1, PT): 200'  Time Frame (Gait Training Goal 1, PT): 1 week  Progress/Outcome (Gait Training Goal 1, PT): good progress toward goal                        Stairs Goal 1 (PT)  Activity/Assistive Device (Stairs Goal 1, PT): ascending stairs, descending stairs  Independence Level/Cues Needed (Stairs Goal 1, PT): modified independence  Number of Stairs (Stairs Goal 1, PT): FOS  Time Frame (Stairs Goal 1, PT): 1 week  Progress/Outcome (Stairs Goal 1, PT): goal ongoing             Plan   Plan: Continued Skilled Inpatient PT Services   Treatment Interventions: Therapeutic exercise, Assistive device training, Funtional mobility training, Gait training, Stair training, Neuromuscular Re-education/balance  PT Frequency and Duration: 5-7 x/week until Discharge    Recommendations  Anticipate Discharge to: Acute Rehab  Recommended mobility with nursing staff: A x 1 with cane    Judithe Modest, PT  [place] lic # [id number]

## 2020-10-09 NOTE — Care Plan (Signed)
 Goal Outcome Evaluation:               Pt noted to be tearful and frustrated when talking to family member on phone; pt having difficulty articulating words d/t recent CVA. Pt amenable to psych consult, which has been ordered.

## 2020-10-09 NOTE — Progress Notes (Signed)
 Occupational Therapy Treatment       Patient Name: [name].  MRN: [id number]  Treatment Date: 10/09/2020    Subjective   HPI/Hospital Course: Pt agreeable to OT services, motivated to work with therapy. PT initially received standing bedside impulsively attempting to untangle self from IV line.    Dx: CVA      Patient Active Problem List   Diagnosis   . Prostate cancer (CMS/HCC)   . Bone metastases (CMS/HCC)   . Dysarthria       Past Medical History:   Diagnosis Date   . Atrial flutter (CMS/HCC)    . Bone cancer (CMS/HCC)    . CHF (congestive heart failure) (CMS/HCC)    . HTN (hypertension)    . PC (prostate cancer) (CMS/HCC)        Past Surgical History:   Procedure Laterality Date   . MRA HEAD WO CONTRAST  10/02/2020    MRA HEAD WO CONTRAST 10/02/2020 [facility] MRI   . MRA NECK W AND WO CONTRAST  10/02/2020    MRA NECK W AND WO CONTRAST 10/02/2020 [facility] MRI       Objective     General Info  OT Received On: 10/09/20  Following Therapy Session:: Patient in Chair, Call bell within reach, Nursing Staff Aware of Patient Location  Plan of Care Reviewed With:: Patient, RN/Charge Nurse, PT              Modified Rankin Scale  Current Status: 4 - Moderately severe disability - unable to walk without assistance and unable to attend to own bodily needs without assistance    Pain Assessment  Pain Assessment: No/denies pain         Cognition  Following Commands: Follows one step commands  Safety Judgment: Decreased Safety awareness  Awareness of Errors/Deficits: Impaired  Communication: Slurred or unclear diction  Behavioral/Affect Comments: slightly impulsive  Cognition Treatment Details: Upon arrival by found to be standing at bedside attempting to untangle self from IV line in which therapist redirected pt to sit and assisted with line management.                             Neuro Treatment  Neuro Comments: Pt demo L neglect, able to id objects in hallway on L side with 50% accuracy with increased time/VCs.    Bed  Mobility  Comments: Pt's BP seated EOB 124/68.  Transfers  Sit to Stand: Minimal Assist  Stand to Sit: Minimal Assist  Bed to Chair: Minimal Assist  Comments: Pt's  BP standing 104/64, with no reported symptoms. BP improving with standing excercise/marching to 116/63.  Functional Mobility  Distance: Household distances  Assistance Level: Minimal Assist  Assistive Device: Single [employer]  Comments: Pt presenting slightly unsteady and intermittently stopping during mobility to id objects when prompted by therapist, ?difficulty multi tasking.  Upon returning room pt presenting slightly impulsive attempting to walk under IV line despite assistance with line management, requiring max VCs for safety. Pt's BP seated in chair following mobility 125/80.  Balance  Standing Balance - Static: Fair- with Development worker, international aid - Dynamic: Fair- with [employer] Provided: Role of OT/Plan of Care, Discharge Planning, ADL Training, Adaptive Equipment/Assistive Device, Development worker, community, Safety Awareness/Fall Risk  Education Provided  to: Patient  Teaching Method: Demonstration, Discussion  Learning Evaluation: Needs practice      Assessment   Pt seen for  Follow up OT treatment session with focus on functional mobility, balance, and safety awareness. Pt continues to present with L neglect, decreased activity tolerance, decreased safety awareness, decreased functional mobility, and decreased ADL independence. Continue to recommend rehab to maximize pt's functional independence.    Goals       Transfer Goal 1 (OT)  Activity/Assistive Device (Transfer Goal 1, OT): transfers, all  Independence Level/Cues Needed (Transfer Goal 1, OT): supervision required  Time Frame (Transfer Goal 1, OT): 3 days  Progress/Outcome (Transfer Goal 1, OT): continuing progress toward goal              Dressing Goal 1 (OT)  Activity/Device (Dressing Goal 1, OT): dressing skills, all  Independence/Cues Needed  (Dressing Goal 1, OT): supervision required  Time Frame (Dressing Goal 1, OT): 3 days  Progress/Outcome (Dressing Goal 1, OT): continuing progress toward goal         Grooming Goal 1 (OT)  Activity/Device (Grooming Goal 1, OT): oral care (including container management)  Independence (Grooming Goal 1, OT): supervision required  Time Frame (Grooming Goal 1, OT): 3 days  Progress/Outcome (Grooming Goal 1, OT): continuing progress toward goal                                                                Plan   Plan: Continued skilled Inpatient OT services   Treatment Interventions: ADLs/Self Care Training, Patient/Caregiver Education, Functional Mobility, Teacher, early years/pre, Paediatric nurse, Engineer, civil (consulting), Insurance claims handler, Naval architect  OT Frequency and Duration: 3-5 x/week until Discharge    Recommendations  Anticipate Discharge to: Acute Rehab    Jolyn Nap, OT  [place] lic # [id number]

## 2020-10-09 NOTE — Progress Notes (Signed)
 [facility]  SEP MED PROGRESS NOTE    Today's Date: 10/09/2020  MRN: [id number]  Name: [name].  DOB: [birth year]    SUBJECTIVE  Feeling better today    OBJECTIVE  He has a pneumonia    PHYSICAL EXAM  Blood pressure 104/60, pulse 54, temperature 37.1 C (98.7 F), temperature source Oral, resp. rate 16, height 1.702 m, weight 79.5 kg, SpO2 97 %.  Full awake alert and oriented x3.  Speaking in full sentences.  No JVD.  Lung examination is rather clear.  Heart regular rate and rhythm.  Abdomen benign.  Extremities no edema    Intake/Output Summary (Last 24 hours) at 10/09/2020 0959  Last data filed at 10/08/2020 1223  Gross per 24 hour   Intake 118 ml   Output --   Net 118 ml       CURRENT INPATIENT MEDS:  aspirin, 81 mg, oral, Daily  atorvastatin, 80 mg, oral, Nightly  clopidogrel, 75 mg, oral, Daily  docusate sodium, 100 mg, oral, BID  gabapentin, 600 mg, oral, TID  labetalol, 0.5 tablet, oral, BID  pantoprazole, 20 mg, oral, Daily before breakfast  piperacillin-tazobactam, 3.375 g, intravenous, q8h  vanco, , intravenous, Warfarin pharmacy dosing       PRN Meds:   PRN medications: acetaminophen, acetaminophen, acetaminophen, bisacodyl, loperamide, ondansetron ODT **OR** ondansetron, polyethylene glycol, [COMPLETED] Insert peripheral IV **AND** [COMPLETED] Saline lock IV **AND** sodium chloride 0.9 %   sodium chloride, 75 mL/hr, Last Rate: 75 mL/hr (10/08/20 1939)      RECENT LABS  Lab Results   Component Value Date    GLUCOSE 96 10/09/2020    NA 141 10/09/2020    K 4.2 10/09/2020    CO2 23 10/09/2020    CL 113 (H) 10/09/2020    BUN 21 10/09/2020    CREATININE 1.57 (H) 10/09/2020    CALCIUM 7.4 (L) 10/09/2020    ALBUMIN 3.6 10/02/2020    AST 28 10/02/2020    ALT 19 10/02/2020    BILITOT 0.6 10/02/2020    ALKPHOS 76 10/02/2020    FERRITIN 223.9 09/03/2020    INR <1.00 10/02/2020    HGBA1C 5.5 10/03/2020    HGBA1C 5.5 10/03/2020     Lab Results   Component Value Date    WBC 3.4 (L) 10/09/2020    HGB 11.3  (L) 10/09/2020    HCT 33.6 (L) 10/09/2020    PLT 108 (L) 10/09/2020       DAILY A/P:  [name] is a 87 y.o. male admitted with Stroke  Working list of problems include Dysarthria [R47.1]  Cerebrovascular accident (CVA), unspecified mechanism (CMS/HCC) [I63.9]     The patient  has a past medical history of Atrial flutter (CMS/HCC), Bone cancer (CMS/HCC), CHF (congestive heart failure) (CMS/HCC), HTN (hypertension), and PC (prostate cancer) (CMS/HCC).    Overall clinical status is stable   Diagnosed with pneumonia, on vancomycin and Zosyn  Hemodynamically stable and afebrile   Mentation wise the patient is what seems to be  CV: BP is within normal limits  Blood sugars: Is normal  Renal: Baseline creatinine  Heme: No leukocytosis, actually pancytopenia, which is new, not on heparin  Pulm: Pneumonia  Consults: None  I/Os as above, UO is acceptable  DVT Prophylaxis on aspirin and Plavix plus ambulatory  GI prophylaxis on Protonix  Clinical course is altered by new pneumonia  Remarkable new tests: CVS as above  ID: On vancomycin and Zosyn  Plan: Continue present plan  Benay Pike, MD

## 2020-10-10 ENCOUNTER — Encounter (HOSPITAL_BASED_OUTPATIENT_CLINIC_OR_DEPARTMENT_OTHER)

## 2020-10-10 MED FILL — VANCOMYCIN IVPB 1.5 G ADD-EASE/VIALMATE: 1.5 1.5000 g | INTRAVENOUS | Qty: 1.5

## 2020-10-12 ENCOUNTER — Encounter

## 2020-10-12 NOTE — Progress Notes (Signed)
 Is the patient appropriate for Home Care? yes    Homebound Status: yes  Covid status: unknown  Insurance carrier: Payor: HUMANA MEDICARE REPLACEMENT / Plan: HUMANA MEDICARE / Product Type: *No Product type* /     Date of potential discharge: 10/23/20    Is there a next day need? No    Does the patient have a PCP? Yes. Will the PCP sign the plan of care? Yes    Is the patient a [facility] patient?Yes    Does patient use Home O2No    Have your cordinated with the IP care team on the patients IV? No  Have your coordinated with the IP care team on the patients Drains? No  Have your coordinated with the IP care team on the patients Wounds? No

## 2020-10-13 DIAGNOSIS — I517 Cardiomegaly: Secondary | ICD-10-CM | POA: Diagnosis not present

## 2020-10-13 DIAGNOSIS — J129 Viral pneumonia, unspecified: Secondary | ICD-10-CM | POA: Diagnosis not present

## 2020-10-13 LAB — BMP (EXT)
Anion Gap (EXT): 9 mmol/L (ref 5–15)
BUN (EXT): 15 mg/dL (ref 9–26)
CO2 (EXT): 22 mmol/L (ref 20–32)
CalciumCalcium (EXT): 8.8 mg/dL (ref 8.5–10.5)
Chloride (EXT): 109 mmol/L (ref 97–109)
Creatinine (EXT): 1.5 mg/dL — ABNORMAL HIGH (ref 0.7–1.3)
Glucose (EXT): 90 mg/dL (ref 70–110)
Potassium (EXT): 4 mmol/L (ref 3.2–5.1)
Sodium (EXT): 140 mmol/L (ref 134–144)
eGFR - Creat MDRD (EXT): 44 mL/min/BSA — ABNORMAL LOW (ref >=60)

## 2020-10-13 LAB — BLOOD CULTURE: Blood Culture: NO GROWTH

## 2020-10-14 ENCOUNTER — Telehealth (HOSPITAL_BASED_OUTPATIENT_CLINIC_OR_DEPARTMENT_OTHER): Admitting: Acute Care

## 2020-10-14 NOTE — Telephone Encounter (Signed)
 Can I confirm that Lupron was started as ordered by Dr Kem Kays at last visit?  Active order is in chart.  Patient had mult hospital visits since last visit

## 2020-10-15 ENCOUNTER — Other Ambulatory Visit

## 2020-10-15 ENCOUNTER — Encounter (HOSPITAL_BASED_OUTPATIENT_CLINIC_OR_DEPARTMENT_OTHER): Admitting: Acute Care

## 2020-10-15 ENCOUNTER — Encounter: Admitting: Internal Medicine

## 2020-10-15 ENCOUNTER — Encounter (HOSPITAL_BASED_OUTPATIENT_CLINIC_OR_DEPARTMENT_OTHER): Admitting: Internal Medicine

## 2020-10-15 ENCOUNTER — Telehealth (HOSPITAL_BASED_OUTPATIENT_CLINIC_OR_DEPARTMENT_OTHER)

## 2020-10-15 ENCOUNTER — Ambulatory Visit: Admit: 2020-10-15 | Discharge: 2020-10-15 | Payer: MEDICARE | Attending: Acute Care | Primary: Internal Medicine

## 2020-10-15 ENCOUNTER — Other Ambulatory Visit (HOSPITAL_BASED_OUTPATIENT_CLINIC_OR_DEPARTMENT_OTHER): Admitting: Acute Care

## 2020-10-15 DIAGNOSIS — C61 Malignant neoplasm of prostate: Secondary | ICD-10-CM

## 2020-10-15 MED ORDER — enzalutamide (Xtandi) 40 mg chemo capsule
40 | ORAL_CAPSULE | Freq: Every day | ORAL | 5 refills | Status: DC
Start: 2020-10-15 — End: 2020-10-15

## 2020-10-15 MED ORDER — enzalutamide (Xtandi) 40 mg chemo capsule
40 | ORAL_CAPSULE | Freq: Every day | ORAL | 5 refills | Status: DC
Start: 2020-10-15 — End: 2020-11-20

## 2020-10-15 NOTE — Telephone Encounter (Signed)
 So [name] is currently in acute rehab, so I dont think he can get the Lupron.  His daughter will call us when he is discharged so the appt can be made.  She is thinking he will be D/C by the 16th.  Also, he never got the xdandi delivered.  So i need to look into that

## 2020-10-15 NOTE — Progress Notes (Signed)
 Patient ID: Tyler Gum. is a 87 y.o. male with metastatic prostate cancer.  Primary Care Provider: Perfecto Kingdom, MD      ONCOLOGIC HISTORY: He was admitted to the hospital s/p fall down the stairs, resulting in a back injury.  Extensive work-up including CT head, CT spine and MRI spine was done.  It revealed evidence of metastatic disease to the back and a mild compression fracture of L2.  There was no evidence of spinal cord compression.  PSA was checked and it returned high at 245.  He was treated conservatively for his fracture and was started on pain meds.  He was then started on Lupron and Casodex, as well as Xgeva. Casodex was then discontinued and he remained on the other two. For his bony met in the back, he underwent kyphoplasty. Repeat PSAs had been low but in early 2021, PSA was noted to have increased to 7.1 in [place]. Restaging scans showed no visceral disease but worsening bony mets. Thus, in March 2021, he was started on Xtandi (reduced dose at 80 mg daily and then 40 mg daily due to loose stools). He moved back to the [place] area in the summer.  Then, in Aug 2021, he left for [facility] again.    In Aug 2021, he was living in [place]. He was on Lupron,  [place] - last doses of Lupron and Xtandi were in Feb 2022 when he was living there. At his last visit with Dr Domenic Moras in June 2022, she reordered the Lupron, [place], [place]. However, patient was admitted to [facility] with an acute CVA, so he has not yet resumed these medications. Patient is currently in acute rehab.    INTERVAL HISTORY: Since his last visit here, patient is in acute rehab. He is recovering after having a CVA. He is working with PT/OT and speech. He denies any new pain, just reports his chronic back pain is stable. He was hospitalized last month due to CVA and PNA- was D/C to acute rehab. He is here today with his daughter. Of note, patient never resumed Xtandi either- he does not yet have the  prescription. He is scheduled to follow up with Jess in the infusion room for teaching.    Review of Systems:  A detailed and complete review of systems with emphasis on today's chief complaint was performed, and all other systems were negative other than as denoted elsewhere in this note.      No Known Allergies     Current Outpatient Medications:   .  acetaminophen (Tylenol) 325 mg capsule, Take 650 mg by mouth every 6 (six) hours if needed for pain score 1-3., Disp: , Rfl:   .  aspirin 81 mg capsule, Take 81 mg by mouth in the morning., Disp: , Rfl:   .  atorvastatin (Lipitor) 80 mg tablet, Take 1 tablet (80 mg) by mouth at bedtime., Disp: 30 tablet, Rfl: 11  .  calcium carbonate-vitamin D3 500 mg-5 mcg (200 unit) tablet, Take 1 tablet by mouth in the morning., Disp: , Rfl:   .  clopidogrel (Plavix) 75 mg tablet, Take 1 tablet (75 mg) by mouth in the morning., Disp: 30 tablet, Rfl: 11  .  cyanocobalamin, vitamin B-12, (VITAMIN B-12 ORAL), 1 (one) time each day., Disp: , Rfl:   .  docusate sodium (Colace) 100 mg capsule, Take 100 mg by mouth in the morning., Disp: , Rfl:   .  gabapentin (Neurontin) 600 mg  tablet, Take 600 mg by mouth if needed in the morning, at noon, and at bedtime., Disp: , Rfl:   .  L. acidophilus/L. bifidus (LACTOBACILLUS ACIDOPH-L. BIFID ORAL), Take by mouth 1 (one) time each day. For constipation, Disp: , Rfl:   .  labetalol (Normodyne) 100 mg tablet, Take 0.5 tablets by mouth in the morning and at bedtime., Disp: , Rfl:   .  lactulose 20 gram/30 mL oral solution, Take 20 g by mouth. For contipatipn, Disp: , Rfl:   .  losartan (Cozaar) 50 mg tablet, Take 50 mg by mouth in the morning., Disp: , Rfl:   .  melatonin 3 mg tablet, Take by mouth at bedtime., Disp: , Rfl:   .  omeprazole (PriLOSEC) 20 mg DR capsule, Take 20 mg by mouth in the morning., Disp: , Rfl:   .  pantoprazole (ProtoNix) 40 mg EC tablet, Take 40 mg by mouth before breakfast. Do not crush, chew, or split., Disp: , Rfl:   .   prochlorperazine (Compazine) 10 mg tablet, Take 1 tablet (10 mg) by mouth every 6 (six) hours if needed for nausea or vomiting., Disp: 30 tablet, Rfl: 1  .  sennosides (Senokot) 8.6 mg tablet, Take 2 tablets (17.2 mg) by mouth at bedtime. Hold for loose stool., Disp: 90 tablet, Rfl: 1  .  traZODone (Desyrel) 50 mg tablet, Take 50 mg by mouth at bedtime., Disp: , Rfl:   .  enzalutamide (Xtandi) 40 mg chemo capsule, Take 2 capsules (80 mg total) by mouth in the morning Take with or without food at the same time each day. Do not crush, break, or dissolve. Swallow it whole., Disp: 60 capsule, Rfl: 5  .  furosemide (Lasix) 40 mg tablet, Take by mouth., Disp: , Rfl:   .  loperamide (Imodium) 2 mg capsule, Take 2 capsules (4 mg total) by mouth after first loose stool, then 1 capsule (2 mg total) after each loose stool. Max 8 capsules per day. (Patient not taking: No sig reported), Disp: 60 capsule, Rfl: 3  .  omega-3 (Fish Oil) 300-1,000 mg capsule, Take 1,000 mg by mouth 3 (three) times a week., Disp: , Rfl:      Treatment Plans     Name Type Plan Dates Plan Provider         Active    Enzalutamide Oral - Prostate Ancillary  09/27/2020 - Present Micheline Maze, MD                  Physical Examination:   Performance Status: Symptomatic; in bed <50% of the day  Blood pressure 125/75, pulse 61, temperature 36.3 C (97.4 F), temperature source Temporal, height 1.702 m, weight 81.2 kg, SpO2 97 %.,  , Body surface area is 1.96 meters squared. body mass index is 28.04 kg/m.     Constitutional: Well-appearing elderly male, not in acute distress. Sitting in wheelchair  HEENT: Oropharynx normal, trachea midline, neck supple, no masses.   Lymphatics: No cervical, supraclavicular, or axillary adenopathy.   Respiratory: Lungs clear to auscultation and percussion bilaterally.   CV: Regular rate and rhythm, no murmurs, rubs, or crackles.  Wearing heart monitor  GI: Soft, nontender, without masses or organomegaly; normal bowel  sounds.  Musculoskeletal: Sitting in wheelchair  Extremities: No edema, clubbing, or signs of VTE.  Neuro: Alert and oriented; no motor asymmetry or gait disturbance.  Skin: No rash or ecchymoses.    Labs:  CBC:  Lab Results   Component Value Date  WBC 3.4 (L) 10/09/2020    HGB 11.3 (L) 10/09/2020    HCT 33.6 (L) 10/09/2020    PLT 108 (L) 10/09/2020    LYMPHOPCT 17.2 10/08/2020    MONOPCT 9.3 10/08/2020    EOSPCT 1.1 10/08/2020     Chemistry:  Lab Results   Component Value Date    NA 141 10/09/2020    K 4.2 10/09/2020    CL 113 (H) 10/09/2020    CO2 23 10/09/2020    BUN 21 10/09/2020    CREATININE 1.57 (H) 10/09/2020    CALCIUM 7.4 (L) 10/09/2020    GLUCOSE 96 10/09/2020       Lab Results   Component Value Date    IRON 68 09/03/2020    FERRITIN 223.9 09/03/2020    TIBC 253 09/03/2020     Tumor Markers:  Lab Results   Component Value Date    PSA 3.17 09/03/2020     ASSESSMENT/PLAN:  Problem List Items Addressed This Visit        Genitourinary    Prostate cancer (CMS/HCC)    Relevant Medications    enzalutamide (Xtandi) 40 mg chemo capsule    Other Relevant Orders    PSA, total and free    Clinic Appointment Request (Dr Kem Kays)    Clinic Appointment Request (with Dr Kem Kays)        This is an 87 y.o. 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 He saw Dr Kem Kays in June and she reordered him for Lupron, Xgeva, and Xtandi, but patient has not yet resumed these given his hospitalization and now in acute rehab. His daughter thinks that he will be D/C August 16th per plan. He is currently at [facility].     Patient's daughter will call us once he is D/C from rehab so that he may meet with Jess in infusion room and have teaching done. He will also received Lupron and Xgeva at that time. His [name] was resent to [facility] pharmacy so that delivery can be arranged.    Patient is now followed by palliative care team due  to his advanced diagnosis.    For his neuropathy, he takes gabapentin TID. He also takes Tylenol as needed for back pain. He denies any new pain.     Patient is agreeable to plan.  He will return for follow-up in 12 weeks. His PSA level will be repeated when he comes for his [place] labs. We will repeat it again prior to his three month visit. He knows to call with questions/concerns anytime.    [name], AOCNP

## 2020-10-15 NOTE — Telephone Encounter (Signed)
 Thank you. I appreciate it.

## 2020-10-15 NOTE — Progress Notes (Signed)
 Is the patient appropriate for Home Care?     Homebound Status: ***  Covid status: ***  Insurance carrier: Payor: HUMANA MEDICARE REPLACEMENT / Plan: HUMANA MEDICARE / Product Type: *No Product type* /     Date of potential discharge: ***    Is there a next day need?     Does the patient have a PCP?     Is the patient a [facility] patient?    Does patient use Home O2    Have your cordinated with the IP care team on the patients IV?   Have your coordinated with the IP care team on the patients Drains?   Have your coordinated with the IP care team on the patients Wounds?

## 2020-10-16 ENCOUNTER — Encounter (HOSPITAL_BASED_OUTPATIENT_CLINIC_OR_DEPARTMENT_OTHER)

## 2020-10-16 ENCOUNTER — Other Ambulatory Visit

## 2020-10-16 NOTE — Telephone Encounter (Signed)
 Let's schedule him with Melissa end of Aug then.     Melissa, you can see who he is doing and then set him up for the Lupron and Xtandi then if he looks ok

## 2020-10-16 NOTE — Progress Notes (Signed)
 Phone call to patient's sister [name] 8/8 & 8/9 with voicemails left explaining the need to schedule education for him after his 8/16 presumed discharge from rehab facility. Advised to call ed. Dept to set up. Patient's copay assistance is pending for Xtandi. JBRN

## 2020-10-17 ENCOUNTER — Other Ambulatory Visit

## 2020-10-17 ENCOUNTER — Other Ambulatory Visit (HOSPITAL_BASED_OUTPATIENT_CLINIC_OR_DEPARTMENT_OTHER)

## 2020-10-17 ENCOUNTER — Encounter (HOSPITAL_BASED_OUTPATIENT_CLINIC_OR_DEPARTMENT_OTHER): Admitting: Internal Medicine

## 2020-10-20 LAB — BMP (EXT)
Anion Gap (EXT): 11 mmol/L (ref 5–15)
BUN (EXT): 17 mg/dL (ref 9–26)
CO2 (EXT): 21 mmol/L (ref 20–32)
CalciumCalcium (EXT): 9.1 mg/dL (ref 8.5–10.5)
Chloride (EXT): 107 mmol/L (ref 97–109)
Creatinine (EXT): 1.4 mg/dL — ABNORMAL HIGH (ref 0.7–1.3)
Glucose (EXT): 81 mg/dL (ref 70–110)
Potassium (EXT): 4.6 mmol/L (ref 3.2–5.1)
Sodium (EXT): 139 mmol/L (ref 134–144)
eGFR - Creat MDRD (EXT): 48 mL/min/BSA — ABNORMAL LOW (ref >=60)

## 2020-10-23 ENCOUNTER — Other Ambulatory Visit: Attending: Internal Medicine

## 2020-10-24 ENCOUNTER — Telehealth

## 2020-10-24 NOTE — Telephone Encounter (Signed)
 Referral date validation: [facility] 10/26/20 per patient's request. Received via TT from Dr. Leodis [place].

## 2020-10-25 ENCOUNTER — Encounter: Payer: MEDICARE | Primary: Internal Medicine

## 2020-10-26 ENCOUNTER — Encounter: Admit: 2020-10-26 | Discharge: 2020-10-26 | Payer: MEDICARE | Primary: Internal Medicine

## 2020-10-26 ENCOUNTER — Other Ambulatory Visit

## 2020-10-26 DIAGNOSIS — I69322 Dysarthria following cerebral infarction: Secondary | ICD-10-CM | POA: Diagnosis not present

## 2020-10-26 DIAGNOSIS — S32000D Wedge compression fracture of unspecified lumbar vertebra, subsequent encounter for fracture with routine healing: Secondary | ICD-10-CM | POA: Diagnosis not present

## 2020-10-26 DIAGNOSIS — G620 Drug-induced polyneuropathy: Secondary | ICD-10-CM | POA: Diagnosis not present

## 2020-10-26 DIAGNOSIS — C61 Malignant neoplasm of prostate: Secondary | ICD-10-CM | POA: Diagnosis not present

## 2020-10-26 DIAGNOSIS — C7951 Secondary malignant neoplasm of bone: Secondary | ICD-10-CM | POA: Diagnosis not present

## 2020-10-26 DIAGNOSIS — T451X5D Adverse effect of antineoplastic and immunosuppressive drugs, subsequent encounter: Secondary | ICD-10-CM | POA: Diagnosis not present

## 2020-10-26 DIAGNOSIS — W109XXD Fall (on) (from) unspecified stairs and steps, subsequent encounter: Secondary | ICD-10-CM | POA: Diagnosis not present

## 2020-10-26 DIAGNOSIS — I130 Hypertensive heart and chronic kidney disease with heart failure and stage 1 through stage 4 chronic kidney disease, or unspecified chronic kidney disease: Secondary | ICD-10-CM | POA: Diagnosis not present

## 2020-10-26 DIAGNOSIS — I69393 Ataxia following cerebral infarction: Secondary | ICD-10-CM | POA: Diagnosis not present

## 2020-10-26 NOTE — Home Health (Signed)
 This SN soc is for 87 y.o. male who was last seen at [facility] ED for dysarthria which lasted for 4 days and unsteady gait. 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 He was started on high intensity statins with aspirin and Plavix being added to the regimen and to be followed neurology. Patient also developed PNA during his hospitalization and was treated empirically with vancomycin and Zosyn x 5 days. He was transferred to [facility] facility for comprehensive medical management and rehabilitation. PMH: Atrial flutter status post cardioversion, CHF, hypertension, chronic kidney disease (baseline creatinine 1.5), chemo-induced polyneuropathy, metastatic prostate cancer on Lupron.    Patient is alert and oriented x3, he is widowed, lives with son and has five children. He denies breathing difficulty or respiratory distress Lung sounds are clear to the upper airways and slightly diminished in the bases. He denies dizziness, headaches, blurriness, double vision, loss of consciousness, seizures, syncopal episodes, chest pain, or palpitations. Patient also has a heart monitor. He reports no issues with bowel movements, abdomen is soft and non-tender with positive BS x4 and no abdominal pain on palpation. He ambulates with a cane and endorses mild generalized weakness. His speech is slow and he at times takes long to answer questions.    Homebound: Patient has metastatic prostate cancer; gait is unsteady limited to short distances, and he has generalized weakness s/p CVA for which he needs assistance with ADLs and IADLs.     SN visits will be scheduled once weekly for cardiovascular and respiratory assessments, monitoring, education and medication management.

## 2020-10-28 ENCOUNTER — Other Ambulatory Visit

## 2020-10-29 ENCOUNTER — Ambulatory Visit

## 2020-10-29 ENCOUNTER — Other Ambulatory Visit

## 2020-10-29 DIAGNOSIS — D6489 Other specified anemias: Secondary | ICD-10-CM | POA: Diagnosis not present

## 2020-10-29 DIAGNOSIS — E612 Magnesium deficiency: Secondary | ICD-10-CM | POA: Diagnosis not present

## 2020-10-29 DIAGNOSIS — I6330 Cerebral infarction due to thrombosis of unspecified cerebral artery: Secondary | ICD-10-CM | POA: Diagnosis not present

## 2020-10-29 DIAGNOSIS — C61 Malignant neoplasm of prostate: Secondary | ICD-10-CM | POA: Diagnosis not present

## 2020-10-30 ENCOUNTER — Ambulatory Visit

## 2020-10-30 ENCOUNTER — Encounter: Admit: 2020-10-30 | Payer: MEDICARE | Primary: Internal Medicine

## 2020-10-30 ENCOUNTER — Other Ambulatory Visit

## 2020-10-30 ENCOUNTER — Ambulatory Visit: Attending: Family | Admitting: Family

## 2020-10-30 ENCOUNTER — Ambulatory Visit: Admitting: Rehabilitative and Restorative Service Providers"

## 2020-10-30 ENCOUNTER — Encounter (HOSPITAL_BASED_OUTPATIENT_CLINIC_OR_DEPARTMENT_OTHER)

## 2020-10-30 ENCOUNTER — Other Ambulatory Visit: Admit: 2020-10-30 | Payer: MEDICARE | Primary: Internal Medicine

## 2020-10-30 DIAGNOSIS — D649 Anemia, unspecified: Secondary | ICD-10-CM

## 2020-10-30 DIAGNOSIS — Z5111 Encounter for antineoplastic chemotherapy: Secondary | ICD-10-CM

## 2020-10-30 DIAGNOSIS — C61 Malignant neoplasm of prostate: Secondary | ICD-10-CM

## 2020-10-30 DIAGNOSIS — C7951 Secondary malignant neoplasm of bone: Secondary | ICD-10-CM | POA: Diagnosis not present

## 2020-10-30 DIAGNOSIS — Z0184 Encounter for antibody response examination: Secondary | ICD-10-CM | POA: Diagnosis not present

## 2020-10-30 LAB — HEPATITIS B SURFACE ANTIBODY QUANT
Hepatitis B surface antibody conc: <3.1 m[IU]/mL (ref <8.00)
Hepatitis B surface antibody: NONREACTIVE

## 2020-10-30 LAB — CBC
Hematocrit: 39.8 % (ref 37.0–53.0)
Hemoglobin: 12.5 g/dL — ABNORMAL LOW (ref 13.0–17.5)
MCH: 34.2 pg — ABNORMAL HIGH (ref 26.0–34.0)
MCHC: 31.4 g/dL (ref 31.0–37.0)
MCV: 109 fL — ABNORMAL HIGH (ref 80.0–100.0)
MPV: 9.9 fL (ref 9.1–12.4)
NRBC %: 0 % (ref 0.0–0.0)
NRBC Absolute: 0 10*3/uL (ref 0.00–2.00)
Platelets: 169 10*3/uL (ref 150–400)
RBC: 3.65 M/uL — ABNORMAL LOW (ref 4.20–5.90)
RDW-CV: 14.4 % (ref 11.5–14.5)
RDW-SD: 57 fL — ABNORMAL HIGH (ref 35.0–51.0)
WBC: 5.4 10*3/uL (ref 4.0–11.0)

## 2020-10-30 LAB — COMPREHENSIVE METABOLIC PANEL
ALT: 18 U/L (ref 0–55)
AST: 23 U/L (ref 6–42)
Albumin: 3.6 g/dL (ref 3.2–5.0)
Alkaline phosphatase: 82 U/L (ref 30–130)
Anion Gap: 6 mmol/L (ref 3–14)
BUN: 24 mg/dL (ref 6–24)
Bilirubin, total: 0.7 mg/dL (ref 0.2–1.2)
CO2 (Bicarbonate): 26 mmol/L (ref 20–32)
Calcium: 9.4 mg/dL (ref 8.5–10.5)
Chloride: 110 mmol/L (ref 98–110)
Creatinine: 1.64 mg/dL — ABNORMAL HIGH (ref 0.55–1.30)
Glucose: 107 mg/dL (ref 70–110)
Potassium: 4.3 mmol/L (ref 3.6–5.2)
Protein, total: 6.9 g/dL (ref 6.0–8.4)
Sodium: 142 mmol/L (ref 135–146)
eGFRcr: 40 mL/min/{1.73_m2} — ABNORMAL LOW (ref >=60)

## 2020-10-30 LAB — PHOSPHORUS: Phosphorus: 3 mg/dL (ref 2.4–4.9)

## 2020-10-30 LAB — CBC WITH DIFFERENTIAL
Basophils %: 0.8 %
Basophils Absolute: 0.04 10*3/uL (ref 0.00–0.22)
Eosinophils %: 6.1 %
Eosinophils Absolute: 0.31 10*3/uL (ref 0.00–0.50)
Hematocrit: 37.5 % (ref 37.0–53.0)
Hemoglobin: 12.3 g/dL — ABNORMAL LOW (ref 13.0–17.5)
Immature Granulocytes %: 0.2 %
Immature Granulocytes Absolute: 0.01 10*3/uL (ref 0.00–0.10)
Lymphocyte %: 32.1 %
Lymphocytes Absolute: 1.63 10*3/uL (ref 0.70–4.00)
MCH: 33.5 pg (ref 26.0–34.0)
MCHC: 32.8 g/dL (ref 31.0–37.0)
MCV: 102.2 fL — ABNORMAL HIGH (ref 80.0–100.0)
MPV: 9.9 fL (ref 9.1–12.4)
Monocytes %: 8.3 %
Monocytes Absolute: 0.42 10*3/uL (ref 0.38–0.83)
NRBC %: 0 % (ref 0.0–0.0)
NRBC Absolute: 0 10*3/uL (ref 0.00–2.00)
Neutrophil %: 52.5 %
Neutrophils Absolute: 2.67 10*3/uL (ref 1.50–7.95)
Platelets: 169 10*3/uL (ref 150–400)
RBC: 3.67 M/uL — ABNORMAL LOW (ref 4.20–5.90)
RDW-CV: 13.2 % (ref 11.5–14.5)
RDW-SD: 49.1 fL (ref 35.0–51.0)
WBC: 5.1 10*3/uL (ref 4.0–11.0)

## 2020-10-30 LAB — BASIC METABOLIC PANEL
Anion Gap: 9 mmol/L (ref 3–14)
BUN: 23 mg/dL (ref 6–24)
CO2 (Bicarbonate): 21 mmol/L (ref 20–32)
Calcium: 9.7 mg/dL (ref 8.5–10.5)
Chloride: 108 mmol/L (ref 98–110)
Creatinine: 1.64 mg/dL — ABNORMAL HIGH (ref 0.55–1.30)
Glucose: 101 mg/dL (ref 70–110)
Potassium: 4.6 mmol/L (ref 3.6–5.2)
Sodium: 138 mmol/L (ref 135–146)
eGFRcr: 40 mL/min/{1.73_m2} — ABNORMAL LOW (ref >=60)

## 2020-10-30 LAB — MAGNESIUM
Magnesium: 2.1 mg/dL (ref 1.6–2.6)
Magnesium: 2.2 mg/dL (ref 1.6–2.6)

## 2020-10-30 LAB — HEPATITIS B SURFACE ANTIGEN: Hepatitis B surface antigen: NONREACTIVE

## 2020-10-30 LAB — RAINBOW DRAW SST GOLD TOP

## 2020-10-30 LAB — HEPATITIS B CORE ANTIBODY, TOTAL: Hepatitis B core antibody, total: NONREACTIVE

## 2020-10-30 NOTE — Progress Notes (Signed)
 His daughter [name].    Thanks!   Jess

## 2020-10-30 NOTE — Progress Notes (Signed)
 Patient in for re-educate on Xtandi and have Lupron/Xgeva injections. He has not received his financial assistance for high copay yet, so no fill of Xtandi with [facility] pharmacy yet. He has taken this in the past, understands all chemo precautions in the home, but revisited this for a refresher. We discussed his current dosing of 8-mg once daily, how to take it, to separate from his protonix, and when to call the physician if there is a problem. Refill process was covered in detail including the 7 day rule to allow time for ordering and fill by pharmacy with delivery. Patient was advised to get labs before his visit today, but thought his labs at PCP office would be visible by [place] from yesterday and they are not. We cannot give Xgeva with no calcium level, so patient will have labs drawn and return tomorrow morning for Xgeva and lupron injection at 830am. Daughter here with patient today and participated in education. She inquired about palliative care follow up since his visit was interrupted by admission for stroke before he went to rehab. He is still suffering back pain. This RN will reach out to paliative care to have them call and schedule his visit. A new weight was obtained. Vitals to be done in the morning with injections. All questions answered, will see them tomorrow. 45 minutes spent face to face with this patient. JBRN

## 2020-10-30 NOTE — Progress Notes (Signed)
 [place],    Patient is released from rehab after stroke that occurred during your visit. His daughter is asking about seeing you again to discuss his pain control during my visit with them today. He will be restarting Xtandi once copay assistance is received. Can you ask your staff to call and schedule a visit with them?    Thanks!  Jill Poling

## 2020-10-30 NOTE — Progress Notes (Signed)
 [name]    Thanks!  Jess

## 2020-10-30 NOTE — Progress Notes (Signed)
 Beth, that sounds perfect, thank you!

## 2020-10-30 NOTE — Progress Notes (Signed)
 Ok thanks for the update

## 2020-10-31 ENCOUNTER — Other Ambulatory Visit

## 2020-10-31 ENCOUNTER — Ambulatory Visit

## 2020-10-31 ENCOUNTER — Encounter (HOSPITAL_BASED_OUTPATIENT_CLINIC_OR_DEPARTMENT_OTHER): Admitting: Internal Medicine

## 2020-10-31 ENCOUNTER — Institutional Professional Consult (permissible substitution): Admit: 2020-10-31 | Payer: MEDICARE | Primary: Family Medicine

## 2020-10-31 ENCOUNTER — Encounter: Admit: 2020-10-31 | Discharge: 2020-10-31 | Payer: MEDICARE | Primary: Internal Medicine

## 2020-10-31 VITALS — BP 164/76 | HR 60 | Temp 97.6°F

## 2020-10-31 DIAGNOSIS — C7951 Secondary malignant neoplasm of bone: Secondary | ICD-10-CM

## 2020-10-31 DIAGNOSIS — Z5111 Encounter for antineoplastic chemotherapy: Secondary | ICD-10-CM | POA: Diagnosis not present

## 2020-10-31 DIAGNOSIS — T451X5D Adverse effect of antineoplastic and immunosuppressive drugs, subsequent encounter: Secondary | ICD-10-CM | POA: Diagnosis not present

## 2020-10-31 DIAGNOSIS — I130 Hypertensive heart and chronic kidney disease with heart failure and stage 1 through stage 4 chronic kidney disease, or unspecified chronic kidney disease: Secondary | ICD-10-CM | POA: Diagnosis not present

## 2020-10-31 DIAGNOSIS — W109XXD Fall (on) (from) unspecified stairs and steps, subsequent encounter: Secondary | ICD-10-CM | POA: Diagnosis not present

## 2020-10-31 DIAGNOSIS — S32000D Wedge compression fracture of unspecified lumbar vertebra, subsequent encounter for fracture with routine healing: Secondary | ICD-10-CM | POA: Diagnosis not present

## 2020-10-31 DIAGNOSIS — I69393 Ataxia following cerebral infarction: Secondary | ICD-10-CM | POA: Diagnosis not present

## 2020-10-31 DIAGNOSIS — C61 Malignant neoplasm of prostate: Secondary | ICD-10-CM | POA: Diagnosis not present

## 2020-10-31 DIAGNOSIS — G620 Drug-induced polyneuropathy: Secondary | ICD-10-CM | POA: Diagnosis not present

## 2020-10-31 DIAGNOSIS — I69322 Dysarthria following cerebral infarction: Secondary | ICD-10-CM | POA: Diagnosis not present

## 2020-10-31 LAB — PSA, TOTAL AND FREE
PSA, % Free: 20 % — ABNORMAL LOW (ref >25)
PSA, Free: 1.9 ng/mL
PSA, Total: 9.3 ng/mL — ABNORMAL HIGH (ref <=4.0)

## 2020-10-31 MED ORDER — denosumab (Xgeva) injection 120 mg
120 | Freq: Once | SUBCUTANEOUS | Status: AC
Start: 2020-10-31 — End: 2020-10-31
  Administered 2020-10-31: 13:00:00 120 mg via SUBCUTANEOUS

## 2020-10-31 MED ORDER — leuprolide (3-month) (Eligard) injection 22.5 mg
22.5 | Freq: Once | SUBCUTANEOUS | Status: AC
Start: 2020-10-31 — End: 2020-10-31
  Administered 2020-10-31: 13:00:00 22.5 mg via SUBCUTANEOUS

## 2020-10-31 MED ORDER — leuprolide (3-month) (Lupron Depot) injection 22.5 mg
22.5 | Freq: Once | INTRAMUSCULAR | Status: DC
Start: 2020-10-31 — End: 2020-10-31

## 2020-10-31 MED ORDER — denosumab (Xgeva) 120 mg/1.7 mL (70 mg/mL) injection  - Omnicell Override Pull
120 | SUBCUTANEOUS | Status: AC
Start: 2020-10-31 — End: ?

## 2020-10-31 MED ORDER — leuprolide (3-month) (Eligard) 22.5 mg injection  - Omnicell Override Pull
22.5 | SUBCUTANEOUS | Status: AC
Start: 2020-10-31 — End: ?

## 2020-10-31 MED FILL — DENOSUMAB 120 MG/1.7 ML (70 MG/ML) SUBCUTANEOUS SOLUTION: 120 120 mg/1.7 mL (70 mg/mL) | SUBCUTANEOUS | Qty: 1.7

## 2020-10-31 MED FILL — LEUPROLIDE ACETATE 22.5 MG (3 MONTH) SUBCUTANEOUS SYRINGE: 22.5 22.5 mg | SUBCUTANEOUS | Qty: 1

## 2020-10-31 NOTE — Progress Notes (Signed)
 Patient returns to clinic today for Xgeva/Lupron. Labs resulted & WNL to give Xgeva. Confirms name & Dob. BP up at 160's but patient just took his morning meds for this and can take at home, advised repeat in one hour to ensure coming down. Injections given SC to left arm. Will continue to follow for PO drug copay assistance and set up RN follow ups once this is all set. If approved and med received by 9/6, can start using labs draw yesterday which are within parameters. 3 month Xgeva/Lupron appt requested. JBRN

## 2020-10-31 NOTE — Home Health (Signed)
 This SN soc is for 87 y.o. male who was last seen at [facility] ED for dysarthria which lasted for 4 days and unsteady gait. 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 He was started on high intensity statins with aspirin and Plavix being added to the regimen and to be followed neurology. Patient also developed PNA during his hospitalization and was treated empirically with vancomycin and Zosyn x 5 days. He was transferred to [facility] facility for comprehensive medical management and rehabilitation. PMH: Atrial flutter status post cardioversion, CHF, hypertension, chronic kidney disease (baseline creatinine 1.5), chemo-induced polyneuropathy, metastatic prostate cancer on Lupron.  Pt seen today with supportive daughter present.  Somewhat difficult to engage, appears tired and easily dismissive of issues.  does admit to difficulty being understood at times.pt demos awareness of errors in most occurances, able to correct with slower speech but will benefit from ongoing practice of reduced rate, increased volume and overarticulation to improve intelligibility.  Attempted MOCA with pt who demos impaired STM and attention.  Will benefit from ongoing training and eduation to improve intelligibilty and increase safety with use of compensatory memory strategies.

## 2020-11-01 ENCOUNTER — Other Ambulatory Visit

## 2020-11-02 ENCOUNTER — Encounter
Admit: 2020-11-02 | Discharge: 2020-11-02 | Payer: MEDICARE | Attending: Rehabilitative and Restorative Service Providers" | Primary: Internal Medicine

## 2020-11-02 ENCOUNTER — Encounter

## 2020-11-02 ENCOUNTER — Other Ambulatory Visit

## 2020-11-02 ENCOUNTER — Other Ambulatory Visit: Admitting: Cytopathology

## 2020-11-02 ENCOUNTER — Encounter: Admit: 2020-11-02 | Discharge: 2020-11-02 | Payer: MEDICARE | Primary: Internal Medicine

## 2020-11-02 DIAGNOSIS — I69393 Ataxia following cerebral infarction: Secondary | ICD-10-CM | POA: Diagnosis not present

## 2020-11-02 DIAGNOSIS — C61 Malignant neoplasm of prostate: Secondary | ICD-10-CM | POA: Diagnosis not present

## 2020-11-02 DIAGNOSIS — W109XXD Fall (on) (from) unspecified stairs and steps, subsequent encounter: Secondary | ICD-10-CM | POA: Diagnosis not present

## 2020-11-02 DIAGNOSIS — T451X5D Adverse effect of antineoplastic and immunosuppressive drugs, subsequent encounter: Secondary | ICD-10-CM | POA: Diagnosis not present

## 2020-11-02 DIAGNOSIS — I130 Hypertensive heart and chronic kidney disease with heart failure and stage 1 through stage 4 chronic kidney disease, or unspecified chronic kidney disease: Secondary | ICD-10-CM | POA: Diagnosis not present

## 2020-11-02 DIAGNOSIS — C7951 Secondary malignant neoplasm of bone: Secondary | ICD-10-CM | POA: Diagnosis not present

## 2020-11-02 DIAGNOSIS — I69322 Dysarthria following cerebral infarction: Secondary | ICD-10-CM | POA: Diagnosis not present

## 2020-11-02 DIAGNOSIS — G620 Drug-induced polyneuropathy: Secondary | ICD-10-CM | POA: Diagnosis not present

## 2020-11-02 DIAGNOSIS — S32000D Wedge compression fracture of unspecified lumbar vertebra, subsequent encounter for fracture with routine healing: Secondary | ICD-10-CM | POA: Diagnosis not present

## 2020-11-02 NOTE — Home Health (Signed)
 History of recent hospitalization: Patient is an 87 y.o. male who was last seen at [facility] ED for dysarthria which lasted for 4 days and unsteady gait. 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 He was started on high intensity statins with aspirin and Plavix being added to the regimen and to be followed neurology. Patient also developed PNA during his hospitalization and was treated empirically with vancomycin and Zosyn x 5 days. He was transferred to [facility] facility for comprehensive medical management and rehabilitation.                                                                                                                                                                                                                                       PMHX: Atrial flutter status post cardioversion, CHF, hypertension, chronic kidney disease (baseline creatinine 1.5), chemo-induced polyneuropathy, metastatic prostate cancer on Lupron.      Precautions:  Universal,       Home Living Environment/Equipment:  SC, GB, Shower Chair       PLOF: Patient was independent with ADL's and transfers.    CLOF: Patient is currently Supervision with shower transfers and LB Dressing tasks.       Upon arrival.  Patient seated in living room chair. Patient without any complaints of pain but with concerns of dysarthria.      Pain: 0/10    Vitals: BP: 156/91 HR: 74    Continence:  No issues    Hearing:  No issues    Vision:  Glasses Reading    Cognition/perception: A &O x 2 Person and Place    Speech/swallowing:  Dysarthria    Sensation:  No issues    Edema:  No issues    BUE function:   WFL and strength LUE.   RUE: Limited IR    Functional Ambulation: FM approximately 20 feet x 2 utilizing SC with Supervision     Dynamic Standing Balance: F    Chair transfer:  Patient transferred with Supervision.    Toilet transfer: Patient able to perform transfers with Mod I . Patient stating decreased  reach with  RUE affecting peri hygiene    Tub transfer:- Patient able to perform tub transfers with supervision utilizing GB    Bed transfer: Did not test    Spongebathing:  Patient can perform without assist but becomes easily fatigued when bending over.  Patient could benefit from [facility] to maximize independence     Showering: Supervision.     UB Dressing: Mod I     LB Dressing:  Supervision for LB Dressing tasks. Patient required extended time and was fatigued after   Activity. Patient could benefit from [facility] to maximize independence and safety.    Toileting: Patient can perform but with difficulty reaching back to wipe peri area due to decreased  IR.    Meal Prep: Family assist with these tasks     Cleaning: Family assist with these tasks     Laundry:  Family assist with these tasks     Patient receptive to training. Patient will receive OT skilled Services 1x/week for 8 weeks to maximize  Independence and safety with ADL's and transfers and address cognitive compensatory strategies.

## 2020-11-03 ENCOUNTER — Other Ambulatory Visit

## 2020-11-03 ENCOUNTER — Ambulatory Visit: Admitting: Cytopathology

## 2020-11-03 ENCOUNTER — Other Ambulatory Visit: Admitting: Cytopathology

## 2020-11-04 ENCOUNTER — Other Ambulatory Visit

## 2020-11-05 ENCOUNTER — Ambulatory Visit

## 2020-11-05 ENCOUNTER — Other Ambulatory Visit

## 2020-11-06 ENCOUNTER — Other Ambulatory Visit

## 2020-11-06 ENCOUNTER — Encounter
Admit: 2020-11-06 | Discharge: 2020-11-06 | Payer: MEDICARE | Attending: Rehabilitative and Restorative Service Providers" | Primary: Internal Medicine

## 2020-11-06 ENCOUNTER — Encounter: Admit: 2020-11-06 | Discharge: 2020-11-06 | Payer: MEDICARE | Primary: Internal Medicine

## 2020-11-06 DIAGNOSIS — T451X5D Adverse effect of antineoplastic and immunosuppressive drugs, subsequent encounter: Secondary | ICD-10-CM | POA: Diagnosis not present

## 2020-11-06 DIAGNOSIS — I69322 Dysarthria following cerebral infarction: Secondary | ICD-10-CM | POA: Diagnosis not present

## 2020-11-06 DIAGNOSIS — C7951 Secondary malignant neoplasm of bone: Secondary | ICD-10-CM | POA: Diagnosis not present

## 2020-11-06 DIAGNOSIS — S32000D Wedge compression fracture of unspecified lumbar vertebra, subsequent encounter for fracture with routine healing: Secondary | ICD-10-CM | POA: Diagnosis not present

## 2020-11-06 DIAGNOSIS — I130 Hypertensive heart and chronic kidney disease with heart failure and stage 1 through stage 4 chronic kidney disease, or unspecified chronic kidney disease: Secondary | ICD-10-CM | POA: Diagnosis not present

## 2020-11-06 DIAGNOSIS — W109XXD Fall (on) (from) unspecified stairs and steps, subsequent encounter: Secondary | ICD-10-CM | POA: Diagnosis not present

## 2020-11-06 DIAGNOSIS — C61 Malignant neoplasm of prostate: Secondary | ICD-10-CM | POA: Diagnosis not present

## 2020-11-06 DIAGNOSIS — G620 Drug-induced polyneuropathy: Secondary | ICD-10-CM | POA: Diagnosis not present

## 2020-11-06 DIAGNOSIS — I69393 Ataxia following cerebral infarction: Secondary | ICD-10-CM | POA: Diagnosis not present

## 2020-11-06 NOTE — Home Health (Signed)
 Skilled nursing visit for cardiovascular assessment.  Pt's daughter present during visit.  Pt is alert and oriented x3, pleasant and cooperative with assessment.  VSS, LSCTO all lobes, SPO2 97% RA, BS+x4 abdomen is soft and non tender.  Pt denies CP or DOE, reports SOB if ambulating over 20 feet, no cough noted or reported, no s/s respiratory distress noted or reported.  Pt was given a heart monitor when inpatient at [facility] but pt is non compliant with it, Tigertext sent to PCP, daughter is returning monitor today.  Pt denies pain or discomfort.  No edema noted to BLE.  Voiding clear yellow urine without difficulty and last BM was yesterday.  Reports fair appetite, drinking adequate fluids.  Daugher manages pt's medication, no recent changes.  Pt states he has no questions or concerns at this time.

## 2020-11-06 NOTE — Home Health (Signed)
 Patient seen for OT training and follow up.     Upon arrival, patient seated outside in chair. Patient able to ambulate with cane down three steps with Supervision.   Patient presenting with flat/depressed effect. Upon questioning, patient stating that he is feeling tired as he doesn't sleep many   hours at night.  Patient given suggestions to assist with lack of sleep.  Patient, not receptive stating that he has always slept  for a few hours nightly due to shift work and this is his baseline.    Therapist introduced long handled reacher and sock aid to patient in an effort to  maximize independence with donning socks  Patient  exhibited increased and exhaustive effort  to donn socks  during last visit.  Therapist demonstrated technique and patient able to utilize sock aide  and return demonstration effectively to donn socks.   Patient also educated in regards to ensuring laces of shoes were tied at all times to reduce the risks of falls.  Patient presenting  with elastic laces able to tie laces with Mod I.  Therapist also trained patient with how to utilize reacher effectively to grab items off floor in order to reduce the risks of falls.     Patient given SLUMS cognitive assessment. Patient scored 17/30 with deficits mostly with short term memory and delay recall.  Patient given strategies such as utilizing calendar and writing tasks in memory books  daily to compensate.     Therapist educated both patient and care-giver in regards to utilizing shower chair to conserve energy and for safety. Both parties  are not receptive and stating that patient can perform shower transfers without any problems    Plan is for OT to continue with 1 x/week  to ensure patient is safe with shower transfers and for patient/care-giver education and then discharge.

## 2020-11-07 ENCOUNTER — Other Ambulatory Visit

## 2020-11-07 ENCOUNTER — Encounter: Admit: 2020-11-07 | Discharge: 2020-11-07 | Payer: MEDICARE | Primary: Internal Medicine

## 2020-11-07 DIAGNOSIS — I130 Hypertensive heart and chronic kidney disease with heart failure and stage 1 through stage 4 chronic kidney disease, or unspecified chronic kidney disease: Secondary | ICD-10-CM | POA: Diagnosis not present

## 2020-11-07 DIAGNOSIS — C61 Malignant neoplasm of prostate: Secondary | ICD-10-CM | POA: Diagnosis not present

## 2020-11-07 DIAGNOSIS — C7951 Secondary malignant neoplasm of bone: Secondary | ICD-10-CM | POA: Diagnosis not present

## 2020-11-07 DIAGNOSIS — G620 Drug-induced polyneuropathy: Secondary | ICD-10-CM | POA: Diagnosis not present

## 2020-11-07 DIAGNOSIS — T451X5D Adverse effect of antineoplastic and immunosuppressive drugs, subsequent encounter: Secondary | ICD-10-CM | POA: Diagnosis not present

## 2020-11-07 DIAGNOSIS — I69393 Ataxia following cerebral infarction: Secondary | ICD-10-CM | POA: Diagnosis not present

## 2020-11-07 DIAGNOSIS — S32000D Wedge compression fracture of unspecified lumbar vertebra, subsequent encounter for fracture with routine healing: Secondary | ICD-10-CM | POA: Diagnosis not present

## 2020-11-07 DIAGNOSIS — I69322 Dysarthria following cerebral infarction: Secondary | ICD-10-CM | POA: Diagnosis not present

## 2020-11-07 DIAGNOSIS — W109XXD Fall (on) (from) unspecified stairs and steps, subsequent encounter: Secondary | ICD-10-CM | POA: Diagnosis not present

## 2020-11-07 NOTE — Home Health (Signed)
 Msw eval; Pt is a 87 years old male living with his daughter, [name] and Son [name] in a 1 floor apartment. The daughter works night shift and take care of pt needs during the day time. 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 Pt has 2 daughters and one son and they are all very involve with his care. Pt was sleeping most of today eval. Msw was talking to pt's daughter, [name] while pt was resting due to his high blood pressure. Pt and Pt's daughter is in communication with his pcp regarding his blood pressure issue. Pt did come out to the end of the eval. He was alert, orient x3 but did not say much. The house was clean, pt was clean and appropriately dress. Msw gave pt handicap placard application and [address] [place] application for transportation. Pt and his daughter feels they are doing fine and do not need outside help at this time. Pt agree he does not need future msw intervention at this time. Msw D/C per pt request.

## 2020-11-08 ENCOUNTER — Encounter: Admit: 2020-11-08 | Discharge: 2020-11-08 | Payer: MEDICARE | Primary: Internal Medicine

## 2020-11-08 ENCOUNTER — Other Ambulatory Visit

## 2020-11-08 DIAGNOSIS — C61 Malignant neoplasm of prostate: Secondary | ICD-10-CM | POA: Diagnosis not present

## 2020-11-08 DIAGNOSIS — I69393 Ataxia following cerebral infarction: Secondary | ICD-10-CM | POA: Diagnosis not present

## 2020-11-08 DIAGNOSIS — C7951 Secondary malignant neoplasm of bone: Secondary | ICD-10-CM | POA: Diagnosis not present

## 2020-11-08 DIAGNOSIS — G620 Drug-induced polyneuropathy: Secondary | ICD-10-CM | POA: Diagnosis not present

## 2020-11-08 DIAGNOSIS — I69322 Dysarthria following cerebral infarction: Secondary | ICD-10-CM | POA: Diagnosis not present

## 2020-11-08 DIAGNOSIS — T451X5D Adverse effect of antineoplastic and immunosuppressive drugs, subsequent encounter: Secondary | ICD-10-CM | POA: Diagnosis not present

## 2020-11-08 DIAGNOSIS — W109XXD Fall (on) (from) unspecified stairs and steps, subsequent encounter: Secondary | ICD-10-CM | POA: Diagnosis not present

## 2020-11-08 DIAGNOSIS — S32000D Wedge compression fracture of unspecified lumbar vertebra, subsequent encounter for fracture with routine healing: Secondary | ICD-10-CM | POA: Diagnosis not present

## 2020-11-08 DIAGNOSIS — I130 Hypertensive heart and chronic kidney disease with heart failure and stage 1 through stage 4 chronic kidney disease, or unspecified chronic kidney disease: Secondary | ICD-10-CM | POA: Diagnosis not present

## 2020-11-08 NOTE — Home Health (Signed)
 Pt seen today seen with supportive daughter present. pt has had higher than normal bp this week but MD aware, no med changes at this time. pt asymptomatic besides reporting being more tired this week. Pt admits to frustration when people are unable to understand him on the phone, reminded pt that sometimes phone connection may be poor and other listener may be in noisy area and pt should request patience from communication partners. reviewed "slob" speech techniques with patient, practiced speech agility drills with pt needing mod cueing to slow down. reviewed need for good hydration as pt reports not liking water, daughter encourages liquids as much as possible.  pt still awaiting PT eval, message sent to team to clarify.

## 2020-11-13 ENCOUNTER — Other Ambulatory Visit (HOSPITAL_COMMUNITY): Payer: Self-pay

## 2020-11-13 ENCOUNTER — Other Ambulatory Visit

## 2020-11-13 ENCOUNTER — Encounter
Admit: 2020-11-13 | Discharge: 2020-11-13 | Payer: MEDICARE | Attending: Rehabilitative and Restorative Service Providers" | Primary: Internal Medicine

## 2020-11-13 ENCOUNTER — Ambulatory Visit: Payer: MEDICARE | Attending: Nurse Practitioner | Primary: Internal Medicine

## 2020-11-13 ENCOUNTER — Encounter: Admit: 2020-11-13 | Discharge: 2020-11-13 | Payer: MEDICARE | Primary: Internal Medicine

## 2020-11-13 DIAGNOSIS — W109XXD Fall (on) (from) unspecified stairs and steps, subsequent encounter: Secondary | ICD-10-CM | POA: Diagnosis not present

## 2020-11-13 DIAGNOSIS — S32000D Wedge compression fracture of unspecified lumbar vertebra, subsequent encounter for fracture with routine healing: Secondary | ICD-10-CM | POA: Diagnosis not present

## 2020-11-13 DIAGNOSIS — I69322 Dysarthria following cerebral infarction: Secondary | ICD-10-CM | POA: Diagnosis not present

## 2020-11-13 DIAGNOSIS — C61 Malignant neoplasm of prostate: Secondary | ICD-10-CM | POA: Diagnosis not present

## 2020-11-13 DIAGNOSIS — I130 Hypertensive heart and chronic kidney disease with heart failure and stage 1 through stage 4 chronic kidney disease, or unspecified chronic kidney disease: Secondary | ICD-10-CM | POA: Diagnosis not present

## 2020-11-13 DIAGNOSIS — I69393 Ataxia following cerebral infarction: Secondary | ICD-10-CM | POA: Diagnosis not present

## 2020-11-13 DIAGNOSIS — C7951 Secondary malignant neoplasm of bone: Secondary | ICD-10-CM | POA: Diagnosis not present

## 2020-11-13 DIAGNOSIS — T451X5D Adverse effect of antineoplastic and immunosuppressive drugs, subsequent encounter: Secondary | ICD-10-CM | POA: Diagnosis not present

## 2020-11-13 DIAGNOSIS — G620 Drug-induced polyneuropathy: Secondary | ICD-10-CM | POA: Diagnosis not present

## 2020-11-13 NOTE — Home Health (Signed)
 SN routine visit for 88 y.o. male for cardiopulomonary assessment. Patient in bed on arrival of this RN, daughter and son at home. Appearing tired and sleepy but denying pain or discomfort. AOx3, continues to have speech issues but able to answer questions during this encounter. He reports no chest pain/dizziness or related signs/symptoms of cardiac distress. Daughter reports that patient no longer want uses the  cardiac monitor and it that it will be returned to the supplying company. 1+ edema to the bilateral ankle is noted. RN contacted the patient's PCP regarding the patient's sleepiness and weakness, per daughter he sleeps most of the day and day. Awaiting response from the patient's PCP. Also the patient's BP continues to be elevated. Daughter reports that she reported this issues to the patient's NP but no changes to his medications were made. RN reviewed the patient's medications, sodium intake iwth both the daughter and the patient. Abdomen is SNT with positive BS x4 quadrants, he reports moving his bowels the previous day. Appetite is fair, he denies GI dyspepsia. Education provided on relaxation techniques as well as monitoring for the signs and symptoms of stroke/ heart attack and when to call the 911. No other issues voiced during this visit.

## 2020-11-13 NOTE — Progress Notes (Deleted)
 Palliative Care Visit Note  Palliative care consulted for symptom management, support in the setting of an advanced illness, goals of care discussion. Pt is a 87 y.o. yo male with a history of metastatic prostate cancer, recently hospitalized after a fall. MRI of the spine showed a mild compression fracture at L2. He underwent hypoplasty. Over the past few years he has been splitting his time between [place] and [place].  He has resumed care at the [facility] cancer center and is on Lupron, [place].     At his last palliative care visit, his dtr  [name] thought he was exhibiting signs of a stroke. He was sent to the ED and found have had a CVA. Also noted to have pnuemonia. He was discharged to an acute rehab.    He is recovering after having a CVA. He is working with PT/OT and speech. He denies any new pain, just reports his chronic back pain is stable. He was hospitalized last month due to CVA and PNA- was D/C to acute rehab. He is here today with his daughter. Of note, patient never resumed Xtandi either- he does not yet have the prescription. He is scheduled to follow up with Jess in the infusion room for teaching.He will also received Lupron and Xgeva at that time. His [name] was resent to [facility] pharmacy so that delivery can be arranged.    Patient is now followed by palliative care team due to his advanced diagnosis.    For his neuropathy, he takes gabapentin TID. He also takes Tylenol as needed for back pain. He denies any new pain.      Pain: {denies default vs explaintion:35675}   Breathing:  {without difficulty default:35676}     Appetite:  {appetite - good,fair,poor:35674::"Good"}    Fatigue: {denies default vs explaintion:35675}    Insomnia: {denies default vs explaintion:35675}  Nausea: {denies default vs explaintion:35675}  Vomiting: {denies default vs explaintion:35675}    Constipation/Diarrhea: {denies default vs explaintion:35675}  Urination:  {without difficulty default:35676}      Anxiety: {denies default vs explaintion:35675}Stable.  Continue alprazolam 1 mg p.o. 3 times daily.    Depression: {denies default vs explaintion:35675}    Goals of care: ***  Social History: ***  Functional Assessment: PPS Score:   30-40%     PCP: Perfecto Kingdom, MD    HCP:  Noel Christmas, Saul Fordyce  MOLST:  none on file      PMH:  metastatic prostate cancer    Past Surgical History:   Procedure Laterality Date   . MRA HEAD WO CONTRAST  10/02/2020    MRA HEAD WO CONTRAST 10/02/2020 [facility] MRI   . MRA NECK W AND WO CONTRAST  10/02/2020    MRA NECK W AND WO CONTRAST 10/02/2020 [facility] MRI       No family history on file.    Social History     Tobacco Use   . Smoking status: Former Smoker     Quit date: 1962     Years since quitting: 60.7   . Smokeless tobacco: Never Used   Substance Use Topics   . Alcohol use: Not Currently   . Drug use: Never         Review of Systems: Negative expect as documented above    Current Outpatient Medications   Medication Instructions   . acetaminophen (TYLENOL) 650 mg, oral, Every 6 hours PRN   . aspirin 81 mg, oral, Daily RT   . atorvastatin (LIPITOR) 80 mg,  oral, Nightly   . calcium carbonate-vitamin D3 500 mg-5 mcg (200 unit) tablet 1 tablet, oral, Daily   . clopidogrel (PLAVIX) 75 mg, oral, Daily   . cyanocobalamin, vitamin B-12, (VITAMIN B-12 ORAL) Daily   . docusate sodium (COLACE) 100 mg, oral, Daily   . enzalutamide (Xtandi) 40 mg chemo capsule TAKE 2 CAPSULES BY MOUTH IN THE MORNING WITH OR WITHOUT FOOD AT THE SAME TIME EACH DAY. DO NOT CRUSH, BREAK, OR DISSOLVE. SWALLOW IT WHOLE.   . furosemide (Lasix) 40 mg tablet oral   . gabapentin (NEURONTIN) 600 mg, oral, 3 times daily PRN   . L. acidophilus/L. bifidus (LACTOBACILLUS ACIDOPH-L. BIFID ORAL) oral, Daily, For constipation   . labetalol (Normodyne) 100 mg tablet 0.5 tablets, oral, 2 times daily   . lactulose 20 g, oral, For contipatipn   . loperamide (Imodium) 2 mg capsule Take 2 capsules (4 mg total) by mouth after first loose  stool, then 1 capsule (2 mg total) after each loose stool. Max 8 capsules per day.   . losartan (COZAAR) 50 mg, oral, Daily   . melatonin 3 mg tablet oral, Nightly   . omega-3 (Fish Oil) 300-1,000 mg capsule 1,000 mg, oral, 3 times weekly   . omeprazole (PRILOSEC) 20 mg, oral, Daily   . pantoprazole (PROTONIX) 40 mg, oral, Daily before breakfast, Do not crush, chew, or split.    [name] Kitchen prochlorperazine (COMPAZINE) 10 mg, oral, Every 6 hours PRN   . sennosides (SENOKOT) 17.2 mg, oral, Nightly, Hold for loose stool.   . traZODone (DESYREL) 50 mg, oral, Nightly       No Known Allergies      Physical Exam  There were no vitals taken for this visit.    Physcial Exam:    General: elderly male sitting in a W/C in NAD.  HEENT:  head atraumatic, moist mucous membranes  Resp: RR regular, no cough, no dyspnea  CV: no edema  GI: soft, non-tender, non-distended  MS: No acute abnormalities  Neuro: Alert, oriented x 2, able to follow simple commands, speech garbled  Psych: calm, cooperative    Assessment & Plan:  Palliative care consulted for symptom management, support in the setting of an advanced illness, goals of care discussion. Pt is a 87 y.o. yo male with a history of metastatic prostate cancer, recently hospitalized after a fall. MRI of the spine showed a mild compression fracture at L2. He underwent hypoplasty. Over the past few years he has been splitting his time between [place] and [place].  He has resumed care at the [facility] cancer center and is on Lupron, [place].     He is seen in the cancer center today with his dtr for an initial palliative care visit. Dtr expresses concerns about a few days of increased confusion, difficulty speaking, difficult recognizing words. Pt exhibits this behavior today as well. After consultation with his oncologist, he is sent to the ED, ? CVA.    Time Spent: More than half of this 45 minute visit was spent counseling the patient on their medical condition and prognosis,  overall goals of care, symptom management, and providing emotional and psychosocial support. Total time includes time spent preparing to see the patient, review of medical records, tests, and imaging, time spent documenting clinical information in the health record, referring and communicating with other healthcare professionals, and direct patient contact and care including history and physical exam.     Hulen Skains, NP,ACHPN  D/w: oncology, ED staff

## 2020-11-13 NOTE — Home Health (Signed)
 Patient seen for OT visit and follow up. Patient presenting with flat depressed affect.Patient stating that he gets up at different times and often times lie in bed awake before getting out of bed. Patient educated in regards to the importance of sleep schedule to help manage insomnia.     Therapist discussed goals with both patient and care-giver. Patient has been educated in regards to DME such as sock aide and reacher to perform LB Dressing tasks. Patient and care-giver stating that patient is  able to perform tasks with Mod I . Patient has also been able to safely transfer in and out of tub utilizing grab bar with distant supervision. Patient also educated and aware of toilet aid device to facilitate independence with toileting tasks such as wiping.    Patients vitals BP's132/85, HR:57.  Care-giver stating that patients vitals have fluctuated greatly and "been all over the place". Care-giver stating that nursing was alerted and aware. Patient has met all goals related to OT and caregiver has also been educated in regards to fall prevention and cognitive compensatory strategies. Plan is for discharge from OT skilled services next visit.

## 2020-11-14 ENCOUNTER — Encounter: Admit: 2020-11-14 | Discharge: 2020-11-14 | Payer: MEDICARE | Primary: Internal Medicine

## 2020-11-14 ENCOUNTER — Other Ambulatory Visit

## 2020-11-14 DIAGNOSIS — C7951 Secondary malignant neoplasm of bone: Secondary | ICD-10-CM | POA: Diagnosis not present

## 2020-11-14 DIAGNOSIS — C61 Malignant neoplasm of prostate: Secondary | ICD-10-CM | POA: Diagnosis not present

## 2020-11-14 DIAGNOSIS — I69393 Ataxia following cerebral infarction: Secondary | ICD-10-CM | POA: Diagnosis not present

## 2020-11-14 DIAGNOSIS — S32000D Wedge compression fracture of unspecified lumbar vertebra, subsequent encounter for fracture with routine healing: Secondary | ICD-10-CM | POA: Diagnosis not present

## 2020-11-14 DIAGNOSIS — T451X5D Adverse effect of antineoplastic and immunosuppressive drugs, subsequent encounter: Secondary | ICD-10-CM | POA: Diagnosis not present

## 2020-11-14 DIAGNOSIS — W109XXD Fall (on) (from) unspecified stairs and steps, subsequent encounter: Secondary | ICD-10-CM | POA: Diagnosis not present

## 2020-11-14 DIAGNOSIS — I130 Hypertensive heart and chronic kidney disease with heart failure and stage 1 through stage 4 chronic kidney disease, or unspecified chronic kidney disease: Secondary | ICD-10-CM | POA: Diagnosis not present

## 2020-11-14 DIAGNOSIS — I69322 Dysarthria following cerebral infarction: Secondary | ICD-10-CM | POA: Diagnosis not present

## 2020-11-14 DIAGNOSIS — G620 Drug-induced polyneuropathy: Secondary | ICD-10-CM | POA: Diagnosis not present

## 2020-11-14 NOTE — Home Health (Signed)
 pt seen today with supportive daughter present. still awaiting PT eval. message sent to CM. Pt continues to use fast rate of speech at time, poor articulation.  practiced articulation strategies and pacing strategies, pt demos some reading difficulty but reports this as baseline. is responsive to cueing and at times able to self correct.

## 2020-11-15 ENCOUNTER — Other Ambulatory Visit

## 2020-11-15 ENCOUNTER — Ambulatory Visit

## 2020-11-16 ENCOUNTER — Encounter (HOSPITAL_BASED_OUTPATIENT_CLINIC_OR_DEPARTMENT_OTHER)

## 2020-11-16 ENCOUNTER — Telehealth (HOSPITAL_BASED_OUTPATIENT_CLINIC_OR_DEPARTMENT_OTHER)

## 2020-11-16 NOTE — Oncology Clinic Note (Deleted)
Patient:   [name], [name]            MRN: [id number]            FIN: [id number]               Age:   87 years     Sex:  Male     DOB:  1931-09-08   Associated Diagnoses:   None   Author:   Kem Kays MD, Mendel Corning      CC: Dr. Dianah Field    Chief Complaint: metastatic prostate cancer    History of Presenting Illness:  This is a 87 year old male with history of a flutter, GERD, hyperlipidemia and hypertension now here for consultation regarding his new diagnosis of metastatic prostate cancer.  He is accompanied by his 2 daughters today     Briefly, he was recently admitted to the hospital s/p fall down the stairs, resulting in a back injury.  Extensive work-up including CT head, CT spine and MRI spine was done.  It revealed evidence of metastatic disease to the back and a mild compression fracture of L2.  There was no evidence of spinal cord compression.  PSA was checked and it returned high at 245.  He was treated conservatively for his fracture and was started on pain meds.  He was discharged to rehab where he currently resides.     Currently patient is feeling okay.  He states that his back pain is at level 8 out of 10 most of the time, in spite of being on a fentanyl patch.  He has oxycodone available for breakthrough pain but he is not in the habit of asking for it much.  Denies any neurological symptoms.      Of note, he was given a prescription for bicalutamide at discharge. He started taking it today. He has an appt with his urologist Dr. [name] next week, at which time he is scheduled to receive a Lupron shot.            ROS:  Constitutional: fatigue  Eye: Negative  ENMT: Negative  Respiratory: Negative  Cardiovascular: Negative  Gastrointestinal: Negative  Genitourinary: Negative  Heme/Lymph: Negative  Endocrine: Negative  Immunologic: Negative  Musculoskeletal: back pain  Integumentary: Negative  Neurologic: Negative  Psychiatric: Negative  All other ROS: Negative     Past Medical  History:  Anxiety  Arthritis  Atrial flutter  Back pain  GERD - Gastro-esophageal reflux disease  HTN  Hyperlipidemia  Prostate cancer  SOB - Shortness of breath      Social History:  Alcohol  Details: None  Employment/School  Details: Retired  Home/Environment  Details: Lives with Children.; Comment(s): lives with daughter and brother has 7 children  Nutrition/Health  Details: Regular  Substance Abuse  Details: Never  Tobacco  Details: Former smoker      Family History:  Daughter: High blood pressure; Kidney transplant  Daughter: High blood pressure        Allergies:  Allergies (1) Active Reaction  NKA None Documented  ALLERGIES         Home Meds:   Medication List     Active Medications         Documented             acetaminophen: 650 mg, 2 tab(s), PO, q4hr, PRN: Fever/Pain, Mild to               Moderate, 0 Refill(s).  Al hydroxide/Mg hydroxide/simethicone: 30 mL, PO, q4hr, PRN:               Dyspepsia, 0 Refill(s).             ALPRAZolam: PRN: as needed for anxiety, 0 Refill(s).             aspirin: 81 mg, Chewed, Daily, 0 Refill(s).             bicalutamide: 50 mg, 1 tab(s), PO, q24hr, 0 Refill(s).             codeine-guaifenesin: PO, q4hr, 10ml twice a day as needed, 0               Refill(s).             docusate: 100 mg, 1 cap(s), PO, BID, PRN: Constipation, 0 Refill(s).             fentaNYL: 25 mcg, q72hr, change q 3 days fentayl 25 mgPartial fill               upon request, 0 Refill(s).             gabapentin: 300 mg, 1 cap(s), PO, TID, 0 Refill(s).             lisinopril: 5 mg, 1 tab(s), PO, Daily, 0 Refill(s).             loperamide: 2 mg, 1 tab(s), PO, q6hr, as needed, 0 Refill(s).             metoprolol: 25 mg, 1 cap(s), PO, Daily, hold BP less than 100mg , 0               Refill(s).             omeprazole: 20 mg, PO, Daily, 0 Refill(s).             ondansetron: 4 mg, 1 tab(s), PO, q8hr, as needed, 0 Refill(s).             oxyCODONE: 5 mg, 1 tab(s), PO, q6hr, Partial fill upon request, 0                Refill(s).             polyethylene glycol 3350: 17 Gm, PO, Daily, PRN: Constipation, 0               Refill(s).     Medications Inactivated in the Last 72 Hours         fentaNYL: 12 mcg/hr, 1 patch(es), Transderm, q72hr, Partial fill upon           request, 3 patch(es), 0 Refill(s).         metoprolol: 0 Refill(s).         omeprazole: 20 mg, PO, Daily, 0 Refill(s).        Physical Exam:  Vital signs:  per RN assessment  ECOG: 1-2  General: elderly male appearing weak, not in acute distress  Head and neck: normocephalic, no masses  Eyes, Ear and Throat: moist mucous membranes, extraocular muscles intact, anicteric sclera  Cardiovascular: regular rate and rhythm, no murmurs, gallops or rubs  Respiratory: clear to auscultation bilaterally, no wheezes or crackles  Abdomen: soft, nontender, nondistended, no organomegaly  Extremities: warm, perfused, no edema  Back: with a brace      Labs:  07/21/17  PSA 245        Imaging:  07/20/17 MRI L spine  IMPRESSION:   Multilevel bony metastatic  disease which is most prominent in the sacrum and L1  and L5 vertebral bodies.  Acute mild compression fracture in the L2 superior endplate. A pathologic  fracture cannot be excluded.  Suspected small hematoma in the L1-2 and L1 right posterolateral epidural space  causing moderate central spinal stenosis.  Central spinal stenosis secondary to degenerative change which is moderate at  L2-3 and L4-5, mild to moderate at L5-S1, and mild at L3-4.        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     I reviewed his imaging thus far as well as the high PSA which indicates the diagnosis of stage IV prostate cancer.  I recommend staging scans, CT chest/abdomen/pelvis and bone scan.  I agree with starting bicalutamide now. After a few days, he will receive his first dose of Lupron.  I recommend also starting Xgeva for his bony mets. It is a monthly dose, and  I will order that to start next week. Eventually, bicalutamide can be discontinued and patient will be maintained on Lupron alone until progression. Will discuss more at the next visit.     For his pain, he will continue on fentanyl patch and oxycodone as needed.  I encouraged him to not hesitate to use the oxycodone as needed for his breakthrough pain.  I will also refer him to RadOnc to see if there is a role for palliative XRT.     Patient is agreeable to plan.  He will return for follow-up in 1 month.  He knows to call with questions/concerns anytime.                    Reviewed and electronically verified by:  Kem Kays MD, Mendel Corning                                                                     on:  08/17/2017 17:43Reviewed and electronically authenticated by:  Micheline Maze                                                                       on:  08/17/17 17:43

## 2020-11-16 NOTE — Radiation Clinical Support Note (Deleted)
[email or web address] Intake Entered On:  08/14/2017 11:49     Performed On:  08/14/2017 11:35  by Margaretha Glassing RN, [name]               Vital Signs   Pain Scale Used :   0-10   Temperature Oral :   96.6 DegF   Peripheral Pulse Rate :   52 bpm   Respiratory Rate :   16 br/min   Systolic Blood Pressure :   164 mmHg (HI)    Diastolic Blood Pressure :   78 mmHg   Blood Pressure Extremity :   Right arm   Mean Arterial Pressure :   107 mmHg   SpO2 :   98 %   Oxygen Therapy 1 :   Room air   [facility], [name] - 08/14/2017 11:35    Height/Weight   Height :   170 cm(Converted to: 5 foot 7 in, 66.93 in)    Body Surface Area :   1.86 m2   Weight :   75.1 Kg(Converted to: 165 lb 9 oz)    Body Mass Index :   25.99 Kg/m2   Type of Weight :   Standing   Type of Height :   Stated height   Max Fickle, [name] - 08/14/2017 11:35    [facility] General Information/Subjective   Chief Complaint :   new consult prosatae cancer   High Risk for Falls :   Yes   Cancer Fatigue Scale :   9   Cancer Fatigue Score :   9    Preferred Language :   English   Preferred Communication Mode :   Written   Pain Symptoms :   Yes   Max Fickle, [name] - 08/14/2017 11:35    Primary Pain   Primary Pain Location :   Lower back   Pain Scale Used :   0-10   Pain Score :   9    Primary Pain Interventions :   Medications   Max Fickle, [name] - 08/14/2017 11:35    Image 1 -  Images currently included in the form version of this document have not been included in the text rendition version of the form.   [facility] Infectious Disease Risk Screening   Patient Travel History :   No recent travel   Recent Family Travel History :   No recent travel   Max Fickle, [name] - 08/14/2017 11:35    Allergy   (As Of: 08/14/2017 11:49:23 )   Allergies (Active)   NKA  Estimated Onset Date:   Unspecified ; Created By:   Ronnie Doss; Reaction Status:   Active ; Category:   Drug ; Substance:   NKA ; Type:   Allergy ; Updated By:   Ronnie Doss; Reviewed Date:   08/14/2017 11:37         Medication History   Medication  List   (As Of: 08/14/2017 11:49:23 )   Prescription/Discharge Order    fentaNYL  :   fentaNYL ; Status:   Completed ; Ordered As Mnemonic:   fentaNYL 12 mcg/hr transdermal film, extended release ; Simple Display Line:   12 mcg/hr, 1 patch(es), Transderm, q72hr, Partial fill upon request, 3 patch(es), 0 Refill(s) ; Ordering Provider:   Susette Racer MD, Collene Leyden; Catalog Code:   fentaNYL ; Order Dt/Tm:   07/22/2017 08:44:20            Home Meds    metoprolol  :  metoprolol ; Status:   Discontinued ; Ordered As Mnemonic:   metoprolol succinate 100 mg oral capsule, extended release ; Simple Display Line:   0 Refill(s) ; Ordering Provider:   Susette Racer MD, Collene Leyden; Catalog Code:   metoprolol ; Order Dt/Tm:   07/22/2017 08:43:57          docusate  :   docusate ; Status:   Documented ; Ordered As Mnemonic:   docusate sodium 100 mg oral capsule ; Simple Display Line:   100 mg, 1 cap(s), PO, BID, PRN: Constipation, 0 Refill(s) ; Ordering Provider:   Susette Racer MD, Collene Leyden; Catalog Code:   docusate ; Order Dt/Tm:   07/22/2017 08:43:25          polyethylene glycol 3350  :   polyethylene glycol 3350 ; Status:   Documented ; Ordered As Mnemonic:   MiraLax oral powder for reconstitution ; Simple Display Line:   17 Gm, PO, Daily, PRN: Constipation, 0 Refill(s) ; Catalog Code:   polyethylene glycol 3350 ; Order Dt/Tm:   08/14/2017 11:47:11          loperamide  :   loperamide ; Status:   Documented ; Ordered As Mnemonic:   loperamide 2 mg oral tablet ; Simple Display Line:   2 mg, 1 tab(s), PO, q6hr, as needed, 0 Refill(s) ; Catalog Code:   loperamide ; Order Dt/Tm:   08/14/2017 11:38:26          omeprazole  :   omeprazole ; Status:   Discontinued ; Ordered As Mnemonic:   omeprazole ; Simple Display Line:   20 mg, PO, Daily, 0 Refill(s) ; Catalog Code:   omeprazole ; Order Dt/Tm:   10/19/2015 07:42:45          omeprazole  :   omeprazole ; Status:   Documented ; Ordered As Mnemonic:   omeprazole ; Simple Display Line:   20 mg, PO, Daily, 0  Refill(s) ; Catalog Code:   omeprazole ; Order Dt/Tm:   08/14/2017 11:45:57          fentaNYL  :   fentaNYL ; Status:   Documented ; Ordered As Mnemonic:   fentaNYL ; Simple Display Line:   25 mcg, q72hr, change q 3 days fentayl 25 mgPartial fill upon request, 0 Refill(s) ; Catalog Code:   fentaNYL ; Order Dt/Tm:   08/14/2017 11:39:32          metoprolol  :   metoprolol ; Status:   Documented ; Ordered As Mnemonic:   metoprolol succinate 25 mg oral capsule, extended release ; Simple Display Line:   25 mg, 1 cap(s), PO, Daily, hold BP less than 100mg , 0 Refill(s) ; Catalog Code:   metoprolol ; Order Dt/Tm:   08/14/2017 11:45:01          Al hydroxide/Mg hydroxide/simethicone  :   Al hydroxide/Mg hydroxide/simethicone ; Status:   Documented ; Ordered As Mnemonic:   aluminum hydroxide/magnesium hydroxide/simethicone 200 mg-200 mg-20 mg/5 mL oral suspension ; Simple Display Line:   30 mL, PO, q4hr, PRN: Dyspepsia, 0 Refill(s) ; Ordering Provider:   Susette Racer MD, Collene Leyden; Catalog Code:   Al hydroxide/Mg hydroxide/simethicone ; Order Dt/Tm:   07/22/2017 08:43:18          gabapentin  :   gabapentin ; Status:   Documented ; Ordered As Mnemonic:   gabapentin 300 mg oral capsule ; Simple Display Line:   300 mg, 1 cap(s), PO, TID, 0 Refill(s) ; Catalog Code:  gabapentin ; Order Dt/Tm:   07/12/2017 18:01:00          ondansetron  :   ondansetron ; Status:   Documented ; Ordered As Mnemonic:   Zofran 4 mg oral tablet ; Simple Display Line:   4 mg, 1 tab(s), PO, q8hr, as needed, 0 Refill(s) ; Catalog Code:   ondansetron ; Order Dt/Tm:   08/14/2017 11:46:16          lisinopril  :   lisinopril ; Status:   Documented ; Ordered As Mnemonic:   lisinopril 5 mg oral tablet ; Simple Display Line:   5 mg, 1 tab(s), PO, Daily, 0 Refill(s) ; Ordering Provider:   Susette Racer MD, Collene Leyden; Catalog Code:   lisinopril ; Order Dt/Tm:   07/22/2017 08:43:40          oxyCODONE  :   oxyCODONE ; Status:   Documented ; Ordered As Mnemonic:   oxyCODONE 5 mg  oral tablet ; Simple Display Line:   5 mg, 1 tab(s), PO, q6hr, Partial fill upon request, 0 Refill(s) ; Catalog Code:   oxyCODONE ; Order Dt/Tm:   08/14/2017 11:46:48          bicalutamide  :   bicalutamide ; Status:   Documented ; Ordered As Mnemonic:   bicalutamide 50 mg oral tablet ; Simple Display Line:   50 mg, 1 tab(s), PO, q24hr, 0 Refill(s) ; Catalog Code:   bicalutamide ; Order Dt/Tm:   08/14/2017 11:37:59          acetaminophen  :   acetaminophen ; Status:   Documented ; Ordered As Mnemonic:   acetaminophen 325 mg oral tablet ; Simple Display Line:   650 mg, 2 tab(s), PO, q4hr, PRN: Fever/Pain, Mild to Moderate, 0 Refill(s) ; Ordering Provider:   Susette Racer MD, Collene Leyden; Catalog Code:   acetaminophen ; Order Dt/Tm:   07/22/2017 08:43:08          aspirin  :   aspirin ; Status:   Documented ; Ordered As Mnemonic:   aspirin ; Simple Display Line:   81 mg, Chewed, Daily, 0 Refill(s) ; Catalog Code:   aspirin ; Order Dt/Tm:   07/18/2017 08:40:53          codeine-guaifenesin  :   codeine-guaifenesin ; Status:   Documented ; Ordered As Mnemonic:   Robitussin AC ; Simple Display Line:   PO, q4hr, 10ml twice a day as needed, 0 Refill(s) ; Catalog Code:   codeine-guaifenesin ; Order Dt/Tm:   08/14/2017 11:47:36          ALPRAZolam  :   ALPRAZolam ; Status:   Documented ; Ordered As Mnemonic:   Xanax ; Simple Display Line:   PRN: as needed for anxiety, 0 Refill(s) ; Catalog Code:   ALPRAZolam ; Order Dt/Tm:   09/07/2015 07:07:23            [facility] Social History   Cigarette Smoking Last 365 Days :   No   Exposure to Tobacco Smoke :   Former smoker   Patient used other tobacco products in the last 30 days? :   No   Max Fickle, [name] - 08/14/2017 11:35    Social History   (As Of: 08/14/2017 11:49:23 )   Tobacco:        Former smoker   (Last Updated: 10/19/2015 07:51:16  by Konrad Penta RN, Johnny [place])          Alcohol:  None   (Last Updated: 10/19/2015 07:51:23  by Konrad Penta RN, Johnny Bridge)          Employment/School:        Retired   (Last  Updated: 08/14/2017 11:40:49  by Margaretha Glassing RN, [name])          Nutrition/Health:        Regular   (Last Updated: 08/14/2017 11:41:02  by Margaretha Glassing RN, [name])          Substance Abuse:        Never   (Last Updated: 10/19/2015 07:51:50  by Konrad Penta RN, Johnny Bridge)          Home/Environment:        Lives with Children.   Comments:  08/14/2017 11:49 - Margaretha Glassing RN, [name]: lives with daughter and brother has 7 children   (Last Updated: 08/14/2017 11:49:19  by Margaretha Glassing RN, [name])            Advance Directive   Advanced Directives :   Yes   (Comment: family to bring in [place] RN, [name] - 08/14/2017 11:50 ] )       Margaretha Glassing RN, [name] - 08/14/2017 11:50      [facility] ONC Nutrition   Weight Change > 10lbs in 6 Months :   No   Home Diet :   Regular   Appetite :   Marchia Meiers RN, [name] - 08/14/2017 11:35    Nutritional Risk Factors   Nausea/Vomiting/Diarrhea X 3 Days :   Yes   (Comment: diarrhea Margaretha Glassing RN, [name] - 08/14/2017 11:35 ] )   Margaretha Glassing RN, [name] - 08/14/2017 11:35    [facility] Encounter   Onc Type of Patient :   Outpatient Visit Existing   Onc Visit Level, Existing :   Level 2   Max Fickle, [name] - 08/14/2017 11:35

## 2020-11-16 NOTE — Radiation Clinical Support Note (Deleted)
[email or web address] Intake Entered On:  11/05/2017 15:58     Performed On:  11/05/2017 15:57  by Kendal Hymen               General Information - Med-Onc   Fall Risk Assessment :   No   Have you been hospitalized since your last visit? :   No   Pain Symptoms :   No   Have you had any tests SLV :   No   VAD Monthly Flush :   No   Kendal Hymen - 11/05/2017 15:57    Social History   Current Smoking Status :   Former smoker   Did the patient smoke (past 12 months) :   No tobacco smoking   Patient used other tobacco products in the last 30 days? :   No   Kendal Hymen - 11/05/2017 15:57    Social History   (As Of: 11/05/2017 15:58:20 )   Tobacco:        Former smoker   (Last Updated: 10/19/2015 07:51:16  by Konrad Penta RN, [place] Bridge)          Alcohol:        None   (Last Updated: 10/19/2015 07:51:23  by Konrad Penta RN, Johnny Bridge)          Employment/School:        Retired   (Last Updated: 08/14/2017 11:40:49  by Margaretha Glassing RN, [name])          Nutrition/Health:        Regular   (Last Updated: 08/14/2017 11:41:02  by Margaretha Glassing RN, [name])          Substance Abuse:        Never   (Last Updated: 10/19/2015 07:51:50  by Konrad Penta RN, Johnny Bridge)          Home/Environment:        Lives with Children.   Comments:  08/14/2017 11:49 - Margaretha Glassing RN, [name]: lives with daughter and brother has 7 children   (Last Updated: 08/14/2017 11:49:19  by Margaretha Glassing RN, [name])            ONC Nutrition   MST Patient Able to Complete Assessment :   Yes   MST Score :   0    MST Lose Weight Without Trying :   No   Kendal Hymen - 11/05/2017 15:57

## 2020-11-16 NOTE — Radiation Clinical Support Note (Deleted)
[email or web address] Intake Entered On:  09/03/2017 16:36     Performed On:  09/03/2017 16:36  by Ladell Pier RN, Jasmine December Information - Med-Onc   Preferred Language :   [place]   Fall Risk Assessment :   Yes   Have you been hospitalized since your last visit? :   No   Pain Symptoms :   Yes   Have you had any tests SLV :   No   VAD Monthly Flush :   No   Cancer Fatigue Scale :   4-Moderate fatigue, need to take breaks on occasion   Cancer Fatigue Score :   4    [place] RNRaynelle Fanning - 09/03/2017 16:36    Allergies   (As Of: 09/03/2017 16:36:55 )   Allergies (Active)   NKA  Estimated Onset Date:   Unspecified ; Created By:   Ronnie Doss; Reaction Status:   Active ; Category:   Drug ; Substance:   NKA ; Type:   Allergy ; Updated By:   Ronnie Doss; Reviewed Date:   09/03/2017 16:36         Social History   Current Smoking Status :   Former smoker   Did the patient smoke (past 12 months) :   No tobacco smoking   Patient used other tobacco products in the last 30 days? :   No   Environmental education officer - 09/03/2017 16:36    Social History   (As Of: 09/03/2017 16:36:55 )   Tobacco:        Former smoker   (Last Updated: 10/19/2015 07:51:16  by Konrad Penta RN, [place])          Alcohol:        None   (Last Updated: 10/19/2015 07:51:23  by Konrad Penta RN, [place])          Employment/School:        Retired   (Last Updated: 08/14/2017 11:40:49  by Margaretha Glassing RN, [name])          Nutrition/Health:        Regular   (Last Updated: 08/14/2017 11:41:02  by Margaretha Glassing RN, [name])          Substance Abuse:        Never   (Last Updated: 10/19/2015 07:51:50  by Konrad Penta RN, [place])          Home/Environment:        Lives with Children.   Comments:  08/14/2017 11:49 - Margaretha Glassing RN, [name]: lives with daughter and brother has 7 children   (Last Updated: 08/14/2017 11:49:19  by Margaretha Glassing RN, [name])            ONC Nutrition   MST Patient Able to Complete Assessment :   Yes   MST Score :   0    MST Lose Weight Without Trying :   No   Home Diet :   Regular    Appetite :   Fair   Feeding Ability :   Complete independence   Environmental education officer - 09/03/2017 16:36    Primary Pain   Primary Pain Location :   Lower back   Pain Scale Used :   0-10   Pain Score :   4    Primary Pain Interventions :   Pain medication previously taken, Repositioning, Rest   [place] RNRaynelle Fanning - 09/03/2017 16:36    Image 1 -  Images currently  included in the form version of this document have not been included in the text rendition version of the form.

## 2020-11-16 NOTE — Radiation Clinical Support Note (Deleted)
[email or web address] Intake Entered On:  11/10/2017 13:13     Performed On:  11/10/2017 13:11  by Lonia [place], Amanda               Vital Signs   Temperature Oral :   96.9 DegF   Peripheral Pulse Rate :   80 bpm   Respiratory Rate :   18 br/min   Systolic Blood Pressure :   153 mmHg (HI)    Diastolic Blood Pressure :   85 mmHg   Blood Pressure Extremity :   Right arm   Mean Arterial Pressure :   108 mmHg   SpO2 :   97 %   Sok1 [place], Amanda - 11/10/2017 13:11    Height/Weight   Height :   170 cm(Converted to: 5 foot 7 in, 66.93 in)    Body Surface Area :   1.87 m2   Weight :   75.5 Kg(Converted to: 166 lb 7 oz)    Body Mass Index :   26.12 Kg/m2   Type of Weight :   Standing   Type of Height :   Stated height   Sok1 [place], Amanda - 11/10/2017 13:11    [facility] General Information/Subjective   Chief Complaint :   revisit    High Risk for Falls :   No   Preferred Language :   English   Preferred Communication Mode :   Verbal   Pain Symptoms :   No   Sok1 , [name] - 11/10/2017 13:11    Patient Preferred Method of Communication   Phone Call   [facility] Infectious Disease Risk Screening   Patient Travel History :   No recent travel   Recent Family Travel History :   No recent travel   [place] - 11/10/2017 13:11    Allergy   (As Of: 11/10/2017 13:13:00 )   Allergies (Active)   NKA  Estimated Onset Date:   Unspecified ; Created By:   Ronnie Doss; Reaction Status:   Active ; Category:   Drug ; Substance:   NKA ; Type:   Allergy ; Updated By:   Ronnie Doss; Reviewed Date:   11/10/2017 13:11         Medication History   Medication List   (As Of: 11/10/2017 13:13:00 )   Home Meds    calcium carbonate  :   calcium carbonate ; Status:   Documented ; Ordered As Mnemonic:   calcium (as carbonate) 600 mg oral tablet ; Simple Display Line:   600 mg, 1 tab(s), PO, Daily, 0 Refill(s) ; Catalog Code:   calcium carbonate ; Order Dt/Tm:   10/02/2017 17:18:41          oxyCODONE  :   oxyCODONE ; Status:   Documented ; Ordered As Mnemonic:    oxyCODONE ; Simple Display Line:   5 mg, PO, as needed, 0 Refill(s) ; Catalog Code:   oxyCODONE ; Order Dt/Tm:   09/29/2017 15:24:05          cholecalciferol  :   cholecalciferol ; Status:   Documented ; Ordered As Mnemonic:   Vitamin D3 ; Simple Display Line:   1,000 Int_Unit, PO, Daily, 0 Refill(s) ; Catalog Code:   cholecalciferol ; Order Dt/Tm:   09/04/2017 14:19:46          codeine-guaifenesin  :   codeine-guaifenesin ; Status:   Documented ; Ordered As Mnemonic:   Robitussin AC ; Simple Display Line:  10 mL, PO, q4hr, 10ml twice a day as needed, 0 Refill(s) ; Catalog Code:   codeine-guaifenesin ; Order Dt/Tm:   08/14/2017 11:47:36          polyethylene glycol 3350  :   polyethylene glycol 3350 ; Status:   Documented ; Ordered As Mnemonic:   MiraLax oral powder for reconstitution ; Simple Display Line:   17 Gm, PO, Daily, PRN: Constipation, 0 Refill(s) ; Catalog Code:   polyethylene glycol 3350 ; Order Dt/Tm:   08/14/2017 11:47:11          ondansetron  :   ondansetron ; Status:   Documented ; Ordered As Mnemonic:   Zofran 4 mg oral tablet ; Simple Display Line:   See Instructions, 1 tab(s) PO q8hr PRN, 0 Refill(s) ; Catalog Code:   ondansetron ; Order Dt/Tm:   08/14/2017 11:46:16          metoprolol  :   metoprolol ; Status:   Completed ; Ordered As Mnemonic:   metoprolol succinate 25 mg oral capsule, extended release ; Simple Display Line:   25 mg, 1 cap(s), PO, Daily, 0 Refill(s) ; Catalog Code:   metoprolol ; Order Dt/Tm:   08/14/2017 11:45:01          omeprazole  :   omeprazole ; Status:   Documented ; Ordered As Mnemonic:   omeprazole ; Simple Display Line:   20 mg, PO, Daily, 0 Refill(s) ; Catalog Code:   omeprazole ; Order Dt/Tm:   08/14/2017 11:45:57          fentaNYL  :   fentaNYL ; Status:   Documented ; Ordered As Mnemonic:   fentaNYL ; Simple Display Line:   25 mcg, q72hr, change q 3 days fentayl 25 mgPartial fill upon request, 0 Refill(s) ; Catalog Code:   fentaNYL ; Order Dt/Tm:   08/14/2017  11:39:32          loperamide  :   loperamide ; Status:   Documented ; Ordered As Mnemonic:   loperamide 2 mg oral tablet ; Simple Display Line:   2 mg, 1 tab(s), PO, q6hr, as needed, 0 Refill(s) ; Catalog Code:   loperamide ; Order Dt/Tm:   08/14/2017 11:38:26          bicalutamide  :   bicalutamide ; Status:   Completed ; Ordered As Mnemonic:   bicalutamide 50 mg oral tablet ; Simple Display Line:   50 mg, 1 tab(s), PO, q24hr, 0 Refill(s) ; Catalog Code:   bicalutamide ; Order Dt/Tm:   08/14/2017 11:37:59          acetaminophen  :   acetaminophen ; Status:   Documented ; Ordered As Mnemonic:   acetaminophen 325 mg oral tablet ; Simple Display Line:   650 mg, 2 tab(s), PO, q4hr, PRN: Fever/Pain, Mild to Moderate, 0 Refill(s) ; Ordering Provider:   Susette Racer MD, Collene Leyden; Catalog Code:   acetaminophen ; Order Dt/Tm:   07/22/2017 08:43:08          Al hydroxide/Mg hydroxide/simethicone  :   Al hydroxide/Mg hydroxide/simethicone ; Status:   Documented ; Ordered As Mnemonic:   aluminum hydroxide/magnesium hydroxide/simethicone 200 mg-200 mg-20 mg/5 mL oral suspension ; Simple Display Line:   30 mL, PO, q4hr, PRN: Dyspepsia, 0 Refill(s) ; Ordering Provider:   Susette Racer MD, Collene Leyden; Catalog Code:   Al hydroxide/Mg hydroxide/simethicone ; Order Dt/Tm:   07/22/2017 08:43:18          docusate  :  docusate ; Status:   Documented ; Ordered As Mnemonic:   docusate sodium 100 mg oral capsule ; Simple Display Line:   100 mg, 1 cap(s), PO, BID, PRN: Constipation, 0 Refill(s) ; Ordering Provider:   Susette Racer MD, Collene Leyden; Catalog Code:   docusate ; Order Dt/Tm:   07/22/2017 08:43:25          lisinopril  :   lisinopril ; Status:   Documented ; Ordered As Mnemonic:   lisinopril 5 mg oral tablet ; Simple Display Line:   5 mg, 1 tab(s), PO, Daily, 0 Refill(s) ; Ordering Provider:   Susette Racer MD, Collene Leyden; Catalog Code:   lisinopril ; Order Dt/Tm:   07/22/2017 08:43:40          aspirin  :   aspirin ; Status:   Documented ; Ordered As Mnemonic:    aspirin ; Simple Display Line:   81 mg, Chewed, Daily, 0 Refill(s) ; Catalog Code:   aspirin ; Order Dt/Tm:   07/18/2017 08:40:53          gabapentin  :   gabapentin ; Status:   Documented ; Ordered As Mnemonic:   gabapentin 300 mg oral capsule ; Simple Display Line:   300 mg, 1 cap(s), PO, BID, 0 Refill(s) ; Catalog Code:   gabapentin ; Order Dt/Tm:   07/12/2017 18:01:00          ALPRAZolam  :   ALPRAZolam ; Status:   Documented ; Ordered As Mnemonic:   Xanax ; Simple Display Line:   See Instructions, 0.25 mg PO TID PRN, PRN: as needed for anxiety, 0 Refill(s) ; Catalog Code:   ALPRAZolam ; Order Dt/Tm:   09/07/2015 07:07:23            [facility] Social History   Cigarette Smoking Last 365 Days :   No   Exposure to Tobacco Smoke :   Former smoker   Patient used other tobacco products in the last 30 days? :   No   Sok1 [place], [name] - 11/10/2017 13:11    Social History   (As Of: 11/10/2017 13:13:00 )   Tobacco:        Former smoker   (Last Updated: 10/19/2015 07:51:16  by Konrad Penta RN, Johnny Bridge)          Alcohol:        None   (Last Updated: 10/19/2015 07:51:23  by Konrad Penta RN, Johnny Bridge)          Employment/School:        Retired   (Last Updated: 08/14/2017 11:40:49  by Margaretha Glassing RN, [name])          Nutrition/Health:        Regular   (Last Updated: 08/14/2017 11:41:02  by Margaretha Glassing RN, [name])          Substance Abuse:        Never   (Last Updated: 10/19/2015 07:51:50  by Konrad Penta RN, Johnny Bridge)          Home/Environment:        Lives with Children.   Comments:  08/14/2017 11:49 - Margaretha Glassing RN, [name]: lives with daughter and brother has 7 children   (Last Updated: 08/14/2017 11:49:19  by Margaretha Glassing RN, [name])            Advance Directive   Advanced Directives :   Yes   August Luz - 11/10/2017 13:11    CCA Encounter   Onc Type of Patient :   Outpatient Visit Existing  Onc Visit Level, Existing :   Level 1   Sok1 [place], Amanda - 11/10/2017 13:11

## 2020-11-16 NOTE — Oncology Clinic Note (Deleted)
Patient:   [name], [name]            MRN: [id number]            FIN: [id number]               Age:   87 years     Sex:  Male     DOB:  1931-11-21   Associated Diagnoses:   None   Author:   Lorraine Lax NP, [place] Citizen      Visit Information   Patient is seen under the supervision of Dr. Kem Kays.      Chief Complaint   metastatic prostate cancer    History of Presenting Illness:  This is a 87 year old male with history of a flutter, GERD, hyperlipidemia and hypertension now here for consultation regarding his new diagnosis of metastatic prostate cancer.  He is accompanied by his 2 daughters today     Briefly, he was recently admitted to the hospital s/p fall down the stairs, resulting in a back injury.  Extensive work-up including CT head, CT spine and MRI spine was done.  It revealed evidence of metastatic disease to the back and a mild compression fracture of L2.  There was no evidence of spinal cord compression.  PSA was checked and it returned high at 245.  He was treated conservatively for his fracture and was started on pain meds.  He was discharged to rehab where he currently resides.    Of note, he was given a prescription for bicalutamide at discharge. He started taking it today. He has an appt with his urologist Dr. [name] next week, at which time he is scheduled to receive a Lupron shot.         Interval History   Since his last visit here the patient has had his first injection of Lupron. He states he has had some night sweats but is feeling overall better. He did have the kyphoplasty to his back and is using oxycodone with good relief. He denies any new pains. He also states he started on the Xgeva injections as well. Denies fevers, chills, headache, shortness of breath.     Of note: he wishes to return home to [facility].      Review of Systems   Constitutional:  Fatigue, Decreased activity.    Eye:  Negative.    Ear/Nose/Mouth/Throat:  Negative.    Respiratory:  Negative.    Cardiovascular:  Negative.     Gastrointestinal:  Negative.    Genitourinary:  Negative.    Hematology/Lymphatics:  Negative.    Endocrine:  Negative.    Immunologic:  Negative.    Musculoskeletal:  Negative.    Integumentary:  Negative.    Neurologic:  Alert and oriented X4.    Psychiatric:  Negative.    All other systems are negative      Health Status   Allergies:    Allergic Reactions (Selected)  NKA,    Allergies (1) Active Reaction  NKA None Documented     Problem list:    All Problems  Anxiety / 6213086578 / Confirmed  Atrial flutter / 4696295 / Confirmed  Back pain / 284132440 / Confirmed  SOB - Shortness of breath / 102725366 / Confirmed  Fracture of lumbar vertebrae / 440347425 / Confirmed  GERD - Gastro-esophageal reflux disease / 9563875643 / Confirmed  Hematoma / 3295188416 / Confirmed  Hyperlipidemia / 60630160 / Confirmed  HTN / 1093235573 / Confirmed  Arthritis / 2202542706 / Confirmed  Prostate  cancer / 8119147829 / Confirmed  Metastasis of malignant neoplasm to bone / 562130865 / Confirmed,    Active Problems (12)  Anxiety   Arthritis   Atrial flutter   Back pain   Fracture of lumbar vertebrae   GERD - Gastro-esophageal reflux disease   Hematoma   HTN   Hyperlipidemia   Metastasis of malignant neoplasm to bone   Prostate cancer   SOB - Shortness of breath         Histories   Past Medical History:    No active or resolved past medical history items have been selected or recorded.   Family History:    High blood pressure  Daughter  Daughter  Kidney transplant  Daughter     Procedure history:    repair wrist laceration.   Social History        Social & Psychosocial Habits    Home/Environment  08/14/2017  Lives with: Children    Comment: lives with daughter and brother has 7 children - 08/14/2017 11:49 - Margaretha Glassing RN, [name]    Nutrition/Health  08/14/2017  Type of diet: Regular    Substance Abuse  10/19/2015  Use: Never    Tobacco  10/19/2015  Use: Former smoker    Alcohol  10/19/2015  Use: None    Employment/School  08/14/2017   Status: Retired  .        Physical Examination   Vital Signs   09/15/2017 15:19  Temperature Oral 97.4 DegF    Peripheral Pulse Rate 52 bpm    Respiratory Rate 20 br/min    Blood Pressure Extremity Right arm    Systolic Blood Pressure 161 mmHg  HI    Diastolic Blood Pressure 78 mmHg    Mean Arterial Pressure 106 mmHg    SpO2 97 %    Oxygen Therapy Room air      Measurements from flowsheet : Measurements   09/15/2017 15:19  Height 170 cm    Type of Height Stated height    Weight 75 Kg    Type of Weight Stated weight    BSA 1.86 m2    Body Mass Index 25.95 Kg/m2      General:  Alert and oriented, No acute distress.    Eye:  Normal conjunctiva.    HENT:  Normocephalic, Oral mucosa is moist, No pharyngeal erythema.    Neck:  Supple, Non-tender, No lymphadenopathy.    Respiratory:  Lungs are clear to auscultation, Respirations are non-labored, Breath sounds are equal, Symmetrical chest wall expansion, No chest wall tenderness.    Cardiovascular:  Normal rate, Regular rhythm, No murmur, No gallop, Good pulses equal in all extremities, Normal peripheral perfusion, No edema.    Integumentary:  Warm, Dry, Intact.    Neurologic:  Alert, Oriented.    Cognition and Speech:  Oriented, Speech clear and coherent.    Psychiatric:  Cooperative, Appropriate mood & affect.       Review / Management   Results review:  Lab results   09/09/2017 08:18  PT 10.9 sec    INR 1.1  NA    aPTT 26 sec   09/09/2017 07:02  Glucose Lvl 78 mg/dL    BUN 17 mg/dL    Creatinine 7.846 mg/dL  HI    Afn Amer Glomerular Filtration Rate 52 ml/min/1.42m2  NA    Non-Afn Amer Glomerular Filtration Rate 45 ml/min/1.54m2  NA    Sodium Lvl 143 mmol/L    Potassium Lvl 4.9 mmol/L  Chloride 113 mmol/L  HI    CO2 24 mmol/L    Anion Gap 6    Calcium Lvl 7.3 mg/dL  LOW    WBC 5.8 thous/mm3    RBC 3.86 Mil/mm3  LOW    Hgb 12.4 Gm/dL  LOW    Hct 16.1 %  LOW    Platelet 155 thous/mm3    MCV 99.0 fL    MCH 32.1 pGm    MCHC 32.5 Gm/dL    RDW-SD 09.6 fL    MPV 10.5 fL     Absolute Neutro Count 2.76 thous/mm3    Absolute Lymphs Count 1.74 thous/mm3    Absolute Mono Count 0.63 thous/mm3    Absolute Eos Count 0.59 thous/mm3  HI    Absolute Baso Count 0.04 thous/mm3    Neutrophils 47.8 %  NA    Lymphocytes 30.2 %  NA    Monocytes 10.9 %  NA    Eosinophils 10.2 %  NA    Basophils 0.7 %  NA    Immature Granulocytes 0.2 %    NRBC Percent 0.0 %    Absolute NRBC Count 0.00 thous/mm3  NA       Impression and Plan   This is an 87 year old male with a new diagnosis of stage IV prostate cancer, 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     Imaging thus far as well as the high PSA which indicates the diagnosis of stage IV prostate cancer.  He was recommended to have staging scans, CT chest/abdomen/pelvis and bone scan but he canceled these secondary to the cost.  He was started on bicalutamide and then received his first dose of Lupron as few days later.  He  also started [facility] for his bony mets. It is a monthly dose. He tolerated both these injections well. He does have hot flashes from the Lupron. He will continue.      The bicalutamide can be discontinued and patient will be maintained on Lupron alone until progression.      For his pain, he will continue on fentanyl patch and oxycodone as needed.  He was referred to RadOnc to see if there was a role for palliative XRT. He ultimately went for  kyphoplasty to his spine.     I will trend his PSA monthly.      Patient is agreeable to plan.  He will return for follow-up in 8 weeks.   He knows to call with questions/concerns anytime.                  Reviewed and electronically verified by:  Lorraine Lax NP, Carolinepar                                                                     on:  09/16/2017 14:38parpar Reviewed and electronically authenticated by:  Rosita Fire  par                                                                     on:  09/16/17 14:38parpar

## 2020-11-16 NOTE — Radiation Oncology Re-Assessment (Deleted)
[email or web address] Oncology Re-Assessment Entered On:  09/29/2017 15:23     Performed On:  09/29/2017 15:19  by Corrow RN, Beth               Vitals/Height/Weight   Temperature Temporal :   96.0 DegF (LOW)    Peripheral Pulse Rate :   53 bpm   Respiratory Rate :   20 br/min   Systolic Blood Pressure :   189 mmHg (>HHI)    Diastolic Blood Pressure :   88 mmHg   Mean Arterial Pressure :   122 mmHg   Weight :   75.6 Kg(Converted to: 166 lb 11 oz)    Type of Weight :   Standing   Type of Height :   Estimated height   Corrow RN, Beth - 09/29/2017 15:19    General Information   Chief Complaint :   f/u   Preferred Language :   English   Preferred Communication Mode :   Verbal   High Risk for Falls :   No   Cancer Fatigue Scale :   0-No fatigue   Cancer Fatigue Score :   0    Corrow RN, Beth - 09/29/2017 15:19    Patient Preferred Method of Communication   Phone Call   Infectious Disease Risk Screening   Patient Travel History :   No recent travel   Recent Family Travel History :   No recent travel   Public house manager, Beth - 09/29/2017 15:19    Subjective   Pain Symptoms :   Yes   Corrow RN, Beth - 09/29/2017 15:19    Primary Pain   Primary Pain Location :   Other: lower back    Pain Scale Used :   0-10   Pain Score :   3    Primary Pain Interventions :   No interventions at this time   Corrow RN, Beth - 09/29/2017 15:19    Image 1 -  Images currently included in the form version of this document have not been included in the text rendition version of the form.   Allergy   (As Of: 09/29/2017 15:23:42 )   Allergies (Active)   NKA  Estimated Onset Date:   Unspecified ; Created By:   Ronnie Doss; Reaction Status:   Active ; Category:   Drug ; Substance:   NKA ; Type:   Allergy ; Updated By:   Ronnie Doss; Reviewed Date:   09/29/2017 15:21         Medication History   Medication List   (As Of: 09/29/2017 15:23:42 )   Home Meds    cholecalciferol  :   cholecalciferol ; Status:   Documented ; Ordered As Mnemonic:   Vitamin D3 ;  Simple Display Line:   1,000 Int_Unit, PO, Daily, 0 Refill(s) ; Catalog Code:   cholecalciferol ; Order Dt/Tm:   09/04/2017 14:19:46          codeine-guaifenesin  :   codeine-guaifenesin ; Status:   Documented ; Ordered As Mnemonic:   Robitussin AC ; Simple Display Line:   10 mL, PO, q4hr, 10ml twice a day as needed, 0 Refill(s) ; Catalog Code:   codeine-guaifenesin ; Order Dt/Tm:   08/14/2017 11:47:36          docusate  :   docusate ; Status:   Documented ; Ordered As Mnemonic:   docusate sodium 100 mg oral capsule ; Simple Display Line:   100 mg, 1 cap(s),  PO, BID, PRN: Constipation, 0 Refill(s) ; Ordering Provider:   Susette Racer MD, Collene Leyden; Catalog Code:   docusate ; Order Dt/Tm:   07/22/2017 08:43:25          polyethylene glycol 3350  :   polyethylene glycol 3350 ; Status:   Documented ; Ordered As Mnemonic:   MiraLax oral powder for reconstitution ; Simple Display Line:   17 Gm, PO, Daily, PRN: Constipation, 0 Refill(s) ; Catalog Code:   polyethylene glycol 3350 ; Order Dt/Tm:   08/14/2017 11:47:11          loperamide  :   loperamide ; Status:   Documented ; Ordered As Mnemonic:   loperamide 2 mg oral tablet ; Simple Display Line:   2 mg, 1 tab(s), PO, q6hr, as needed, 0 Refill(s) ; Catalog Code:   loperamide ; Order Dt/Tm:   08/14/2017 11:38:26          omeprazole  :   omeprazole ; Status:   Documented ; Ordered As Mnemonic:   omeprazole ; Simple Display Line:   20 mg, PO, Daily, 0 Refill(s) ; Catalog Code:   omeprazole ; Order Dt/Tm:   08/14/2017 11:45:57          fentaNYL  :   fentaNYL ; Status:   Documented ; Ordered As Mnemonic:   fentaNYL ; Simple Display Line:   25 mcg, q72hr, change q 3 days fentayl 25 mgPartial fill upon request, 0 Refill(s) ; Catalog Code:   fentaNYL ; Order Dt/Tm:   08/14/2017 11:39:32          metoprolol  :   metoprolol ; Status:   Documented ; Ordered As Mnemonic:   metoprolol succinate 25 mg oral capsule, extended release ; Simple Display Line:   25 mg, 1 cap(s), PO, Daily, 0  Refill(s) ; Catalog Code:   metoprolol ; Order Dt/Tm:   08/14/2017 11:45:01          Al hydroxide/Mg hydroxide/simethicone  :   Al hydroxide/Mg hydroxide/simethicone ; Status:   Documented ; Ordered As Mnemonic:   aluminum hydroxide/magnesium hydroxide/simethicone 200 mg-200 mg-20 mg/5 mL oral suspension ; Simple Display Line:   30 mL, PO, q4hr, PRN: Dyspepsia, 0 Refill(s) ; Ordering Provider:   Susette Racer MD, Collene Leyden; Catalog Code:   Al hydroxide/Mg hydroxide/simethicone ; Order Dt/Tm:   07/22/2017 08:43:18          gabapentin  :   gabapentin ; Status:   Documented ; Ordered As Mnemonic:   gabapentin 300 mg oral capsule ; Simple Display Line:   300 mg, 1 cap(s), PO, BID, 0 Refill(s) ; Catalog Code:   gabapentin ; Order Dt/Tm:   07/12/2017 18:01:00          lisinopril  :   lisinopril ; Status:   Documented ; Ordered As Mnemonic:   lisinopril 5 mg oral tablet ; Simple Display Line:   5 mg, 1 tab(s), PO, Daily, 0 Refill(s) ; Ordering Provider:   Susette Racer MD, Collene Leyden; Catalog Code:   lisinopril ; Order Dt/Tm:   07/22/2017 08:43:40          bicalutamide  :   bicalutamide ; Status:   Documented ; Ordered As Mnemonic:   bicalutamide 50 mg oral tablet ; Simple Display Line:   50 mg, 1 tab(s), PO, q24hr, 0 Refill(s) ; Catalog Code:   bicalutamide ; Order Dt/Tm:   08/14/2017 11:37:59          acetaminophen  :   acetaminophen ;  Status:   Documented ; Ordered As Mnemonic:   acetaminophen 325 mg oral tablet ; Simple Display Line:   650 mg, 2 tab(s), PO, q4hr, PRN: Fever/Pain, Mild to Moderate, 0 Refill(s) ; Ordering Provider:   Susette Racer MD, Collene Leyden; Catalog Code:   acetaminophen ; Order Dt/Tm:   07/22/2017 08:43:08          aspirin  :   aspirin ; Status:   Documented ; Ordered As Mnemonic:   aspirin ; Simple Display Line:   81 mg, Chewed, Daily, 0 Refill(s) ; Catalog Code:   aspirin ; Order Dt/Tm:   07/18/2017 08:40:53          ondansetron  :   ondansetron ; Status:   Documented ; Ordered As Mnemonic:   Zofran 4 mg oral tablet ;  Simple Display Line:   See Instructions, 1 tab(s) PO q8hr PRN, 0 Refill(s) ; Catalog Code:   ondansetron ; Order Dt/Tm:   08/14/2017 11:46:16          ALPRAZolam  :   ALPRAZolam ; Status:   Documented ; Ordered As Mnemonic:   Xanax ; Simple Display Line:   See Instructions, 0.25 mg PO TID PRN, PRN: as needed for anxiety, 0 Refill(s) ; Catalog Code:   ALPRAZolam ; Order Dt/Tm:   09/07/2015 07:07:23            Social History   Current Smoking Status :   Former smoker   Did the patient smoke (past 12 months) :   No tobacco smoking   Patient used other tobacco products in the last 30 days? :   No   Corrow RN, Beth - 09/29/2017 15:19    Social History   (As Of: 09/29/2017 15:23:42 )   Tobacco:        Former smoker   (Last Updated: 10/19/2015 07:51:16  by Konrad Penta RN, Johnny Bridge)          Alcohol:        None   (Last Updated: 10/19/2015 07:51:23  by Konrad Penta RN, Johnny Bridge)          Employment/School:        Retired   (Last Updated: 08/14/2017 11:40:49  by Margaretha Glassing RN, [name])          Nutrition/Health:        Regular   (Last Updated: 08/14/2017 11:41:02  by Margaretha Glassing RN, [name])          Substance Abuse:        Never   (Last Updated: 10/19/2015 07:51:50  by Konrad Penta RN, Johnny Bridge)          Home/Environment:        Lives with Children.   Comments:  08/14/2017 11:49 - Margaretha Glassing RN, [name]: lives with daughter and brother has 7 children   (Last Updated: 08/14/2017 11:49:19  by Margaretha Glassing RN, [name])            ONC Nutrition   MST Patient Able to Complete Assessment :   Yes   MST Score :   0    MST Lose Weight Without Trying :   No   Appetite :   Fair   Public house manager, Beth - 09/29/2017 15:19    Advance Directive   Advanced Directives :   Yes   Advance Directive Type :   Health Care Proxy   Corrow RN, Beth - 09/29/2017 15:19    CCA Encounter   Onc Type of Patient :   Outpatient Visit Existing  Onc Visit Level, Existing :   No charge   Public house manager, Beth - 09/29/2017 15:19

## 2020-11-16 NOTE — Radiation Clinical Support Note (Deleted)
[email or web address] Intake Entered On:  10/29/2017 16:05     Performed On:  10/29/2017 14:30  by Ladell Pier RN, Jasmine December Information - Med-Onc   Preferred Language :   [place]   Fall Risk Assessment :   Yes   Have you been hospitalized since your last visit? :   No   Pain Symptoms :   Yes   Have you had any tests SLV :   No   VAD Monthly Flush :   No   Cancer Fatigue Scale :   3   Cancer Fatigue Score :   3    Drinkwater RN, Raynelle Fanning - 10/29/2017 16:02    Allergies   (As Of: 10/29/2017 16:05:23 )   Allergies (Active)   NKA  Estimated Onset Date:   Unspecified ; Created By:   Ronnie Doss; Reaction Status:   Active ; Category:   Drug ; Substance:   NKA ; Type:   Allergy ; Updated By:   Ronnie Doss; Reviewed Date:   10/29/2017 16:03         Medication History   Medication List   (As Of: 10/29/2017 16:05:23 )   Normal Order    calcium gluconate concentrate  :   calcium gluconate concentrate ; Status:   Completed ; Ordered As Mnemonic:   calcium gluconate ; Simple Display Line:   1,000 mg, 10 mL, 285 ml/hr, IV Piggyback, Once ; Ordering Provider:   Kem Kays MD, Mendel Corning; Catalog Code:   calcium gluconate ; Order Dt/Tm:   10/29/2017 14:11:14            Home Meds    calcium carbonate  :   calcium carbonate ; Status:   Documented ; Ordered As Mnemonic:   calcium (as carbonate) 600 mg oral tablet ; Simple Display Line:   600 mg, 1 tab(s), PO, Daily, 0 Refill(s) ; Catalog Code:   calcium carbonate ; Order Dt/Tm:   10/02/2017 17:18:41          oxyCODONE  :   oxyCODONE ; Status:   Documented ; Ordered As Mnemonic:   oxyCODONE ; Simple Display Line:   5 mg, PO, as needed, 0 Refill(s) ; Catalog Code:   oxyCODONE ; Order Dt/Tm:   09/29/2017 15:24:05          cholecalciferol  :   cholecalciferol ; Status:   Documented ; Ordered As Mnemonic:   Vitamin D3 ; Simple Display Line:   1,000 Int_Unit, PO, Daily, 0 Refill(s) ; Catalog Code:   cholecalciferol ; Order Dt/Tm:   09/04/2017 14:19:46           codeine-guaifenesin  :   codeine-guaifenesin ; Status:   Documented ; Ordered As Mnemonic:   Robitussin AC ; Simple Display Line:   10 mL, PO, q4hr, 10ml twice a day as needed, 0 Refill(s) ; Catalog Code:   codeine-guaifenesin ; Order Dt/Tm:   08/14/2017 11:47:36          polyethylene glycol 3350  :   polyethylene glycol 3350 ; Status:   Documented ; Ordered As Mnemonic:   MiraLax oral powder for reconstitution ; Simple Display Line:   17 Gm, PO, Daily, PRN: Constipation, 0 Refill(s) ; Catalog Code:   polyethylene glycol 3350 ; Order Dt/Tm:   08/14/2017 11:47:11          ondansetron  :   ondansetron ; Status:   Documented ; Ordered  As Mnemonic:   Zofran 4 mg oral tablet ; Simple Display Line:   See Instructions, 1 tab(s) PO q8hr PRN, 0 Refill(s) ; Catalog Code:   ondansetron ; Order Dt/Tm:   08/14/2017 11:46:16          metoprolol  :   metoprolol ; Status:   Documented ; Ordered As Mnemonic:   metoprolol succinate 25 mg oral capsule, extended release ; Simple Display Line:   25 mg, 1 cap(s), PO, Daily, 0 Refill(s) ; Catalog Code:   metoprolol ; Order Dt/Tm:   08/14/2017 11:45:01          omeprazole  :   omeprazole ; Status:   Documented ; Ordered As Mnemonic:   omeprazole ; Simple Display Line:   20 mg, PO, Daily, 0 Refill(s) ; Catalog Code:   omeprazole ; Order Dt/Tm:   08/14/2017 11:45:57          fentaNYL  :   fentaNYL ; Status:   Documented ; Ordered As Mnemonic:   fentaNYL ; Simple Display Line:   25 mcg, q72hr, change q 3 days fentayl 25 mgPartial fill upon request, 0 Refill(s) ; Catalog Code:   fentaNYL ; Order Dt/Tm:   08/14/2017 11:39:32          loperamide  :   loperamide ; Status:   Documented ; Ordered As Mnemonic:   loperamide 2 mg oral tablet ; Simple Display Line:   2 mg, 1 tab(s), PO, q6hr, as needed, 0 Refill(s) ; Catalog Code:   loperamide ; Order Dt/Tm:   08/14/2017 11:38:26          bicalutamide  :   bicalutamide ; Status:   Documented ; Ordered As Mnemonic:   bicalutamide 50 mg oral tablet ;  Simple Display Line:   50 mg, 1 tab(s), PO, q24hr, 0 Refill(s) ; Catalog Code:   bicalutamide ; Order Dt/Tm:   08/14/2017 11:37:59          acetaminophen  :   acetaminophen ; Status:   Documented ; Ordered As Mnemonic:   acetaminophen 325 mg oral tablet ; Simple Display Line:   650 mg, 2 tab(s), PO, q4hr, PRN: Fever/Pain, Mild to Moderate, 0 Refill(s) ; Ordering Provider:   Susette Racer MD, Collene Leyden; Catalog Code:   acetaminophen ; Order Dt/Tm:   07/22/2017 08:43:08          Al hydroxide/Mg hydroxide/simethicone  :   Al hydroxide/Mg hydroxide/simethicone ; Status:   Documented ; Ordered As Mnemonic:   aluminum hydroxide/magnesium hydroxide/simethicone 200 mg-200 mg-20 mg/5 mL oral suspension ; Simple Display Line:   30 mL, PO, q4hr, PRN: Dyspepsia, 0 Refill(s) ; Ordering Provider:   Susette Racer MD, Collene Leyden; Catalog Code:   Al hydroxide/Mg hydroxide/simethicone ; Order Dt/Tm:   07/22/2017 08:43:18          docusate  :   docusate ; Status:   Documented ; Ordered As Mnemonic:   docusate sodium 100 mg oral capsule ; Simple Display Line:   100 mg, 1 cap(s), PO, BID, PRN: Constipation, 0 Refill(s) ; Ordering Provider:   Susette Racer MD, Collene Leyden; Catalog Code:   docusate ; Order Dt/Tm:   07/22/2017 08:43:25          lisinopril  :   lisinopril ; Status:   Documented ; Ordered As Mnemonic:   lisinopril 5 mg oral tablet ; Simple Display Line:   5 mg, 1 tab(s), PO, Daily, 0 Refill(s) ; Ordering Provider:   Susette Racer MD, Gladstone Pih  Z; Catalog Code:   lisinopril ; Order Dt/Tm:   07/22/2017 08:43:40          aspirin  :   aspirin ; Status:   Documented ; Ordered As Mnemonic:   aspirin ; Simple Display Line:   81 mg, Chewed, Daily, 0 Refill(s) ; Catalog Code:   aspirin ; Order Dt/Tm:   07/18/2017 08:40:53          gabapentin  :   gabapentin ; Status:   Documented ; Ordered As Mnemonic:   gabapentin 300 mg oral capsule ; Simple Display Line:   300 mg, 1 cap(s), PO, BID, 0 Refill(s) ; Catalog Code:   gabapentin ; Order Dt/Tm:   07/12/2017 18:01:00           ALPRAZolam  :   ALPRAZolam ; Status:   Documented ; Ordered As Mnemonic:   Xanax ; Simple Display Line:   See Instructions, 0.25 mg PO TID PRN, PRN: as needed for anxiety, 0 Refill(s) ; Catalog Code:   ALPRAZolam ; Order Dt/Tm:   09/07/2015 07:07:23          cholecalciferol  :   cholecalciferol ; Status:   Discontinued ; Ordered As Mnemonic:   Vitamin D3 ; Simple Display Line:   0 Refill(s) ; Catalog Code:   cholecalciferol ; Order Dt/Tm:   10/02/2017 17:19:08            Social History   Current Smoking Status :   Former smoker   Did the patient smoke (past 12 months) :   No tobacco smoking   Patient used other tobacco products in the last 30 days? 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    Social History   (As Of: 10/29/2017 16:05:23 )   Tobacco:        Former smoker   (Last Updated: 10/19/2015 07:51:16  by Konrad Penta RN, Johnny Bridge)          Alcohol:        None   (Last Updated: 10/19/2015 07:51:23  by Konrad Penta RN, Johnny Bridge)          Employment/School:        Retired   (Last Updated: 08/14/2017 11:40:49  by Margaretha Glassing RN, [name])          Nutrition/Health:        Regular   (Last Updated: 08/14/2017 11:41:02  by Margaretha Glassing RN, [name])          Substance Abuse:        Never   (Last Updated: 10/19/2015 07:51:50  by Konrad Penta RN, Johnny Bridge)          Home/Environment:        Lives with Children.   Comments:  08/14/2017 11:49 - Margaretha Glassing RN, [name]: lives with daughter and brother has 7 children   (Last Updated: 08/14/2017 11:49:19  by Margaretha Glassing RN, [name])            ONC Nutrition   MST Patient Able to Complete Assessment :   Yes   MST Score :   0    MST Lose Weight Without Trying :   No   Home Diet :   Regular   Appetite :   Fair   Feeding Ability :   Complete independence   Environmental education officer - 10/29/2017 16:02    Primary Pain   Primary Pain Location :   Lower back   Pain Scale Used :   0-10   Pain  Score :   3    Primary Pain Interventions :   Repositioning, Rest   Primary Pain Time Pattern :   Chronic, Constant   Primary Pain Quality :   Aching    Primary Pain Aggravating Factors :   Movement   Primary Pain Alleviating Factors :   Assistive devices   Associated Symptoms :   None   Pain Negatively Impacts :   Daily life   Drinkwater RN, Raynelle Fanning - 10/29/2017 16:02    Image 1 -  Images currently included in the form version of this document have not been included in the text rendition version of the form.

## 2020-11-16 NOTE — Progress Notes (Signed)
 Phone call to patient's phone again, Robin answers phone and denies getting any messages until today. I advised she needs to reach out to the manufacturer or [employer] to set up delivery and patient needs to have labs before starting, so we need to make a plan. She continues to say she will just call us. I advised she needs to take patient for labs by monday and I will call Tuesday to get the delivery info from her and give lab results and ok to start as well as book his first follow up. I asked if there is anything we can do to ease the stress of helping him with his care because she voices that he has appts to get to and people coming into the home and they cannot just go for labs. She continues to say she will just call us. It has already been three weeks since his teach, and patient needs to start med. Will be sending note to social work to alert. JBRN

## 2020-11-16 NOTE — Progress Notes (Signed)
 Phone call to patient's primary phone number, which is for daughter Valentina Lucks to return phone call to coordinate delivery of medication.  Message in [place], that she has left multiple messages as well, with no return phone call yet.

## 2020-11-16 NOTE — Progress Notes (Signed)
 [name],    Please read my DARP below regarding patient's PO med that has not been started & difficulty arranging copy assistance/delivery with his daughter [name].    Thank you

## 2020-11-16 NOTE — Radiation Clinical Support Note (Deleted)
[email or web address] Intake Entered On:  12/03/2017 16:41     Performed On:  12/03/2017 16:41  by Vladimir Crofts RN, Raynelle Fanning               General Information - Med-Onc   Fall Risk Assessment :   No   Have you been hospitalized since your last visit? :   No   Pain Symptoms :   No   Have you had any tests SLV :   No   VAD Monthly Flush :   No   Morissette RN, Raynelle Fanning - 12/03/2017 16:41    Social History   Current Smoking Status :   Former smoker   Did the patient smoke (past 12 months) :   No tobacco smoking   Patient used other tobacco products in the last 30 days? 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    Social History   (As Of: 12/03/2017 16:41:27 )   Tobacco:        Former smoker   (Last Updated: 10/19/2015 07:51:16  by Konrad Penta RN, Johnny Bridge)          Alcohol:        None   (Last Updated: 10/19/2015 07:51:23  by Konrad Penta RN, Johnny Bridge)          Employment/School:        Retired   (Last Updated: 08/14/2017 11:40:49  by Margaretha Glassing RN, [name])          Nutrition/Health:        Regular   (Last Updated: 08/14/2017 11:41:02  by Margaretha Glassing RN, [name])          Substance Abuse:        Never   (Last Updated: 10/19/2015 07:51:50  by Konrad Penta RN, Johnny Bridge)          Home/Environment:        Lives with Children.   Comments:  08/14/2017 11:49 - Margaretha Glassing RN, [name]: lives with daughter and brother has 7 children   (Last Updated: 08/14/2017 11:49:19  by Margaretha Glassing RN, [name])            ONC Nutrition   MST Patient Able to Complete Assessment :   Yes   MST Score :   0    MST Lose Weight Without Trying :   No   Appetite :   Good   Morissette RN, Raynelle Fanning - 12/03/2017 16:41

## 2020-11-16 NOTE — Oncology Clinic Note (Deleted)
Patient:   [name], [name]            MRN: [id number]            FIN: [id number]               Age:   87 years     Sex:  Male     DOB:  26-[name]-1932   Associated Diagnoses:   None   Author:   Kem Kays MD, Mendel Corning      Chief Complaint: prostate cancer    Oncologic/hematologic History:  He was recently admitted to the hospital s/p fall down the stairs, resulting in a back injury.  Extensive work-up including CT head, CT spine and MRI spine was done.  It revealed evidence of metastatic disease to the back and a mild compression fracture of L2.  There was no evidence of spinal cord compression.  PSA was checked and it returned high at 245.  He was treated conservatively for his fracture and was started on pain meds.  He was then started on Lupron and Casodex, as well as Xgeva. Casodex was then discontinued and he remained on the other two. For his bony met in the back, he underwent kyphoplasty. Repeat PSA on 10/29/17 was 0.1.      Interval History:   Since his last visit here, he has been feeling well. He is tolerating treatment pretty well. He does endorse hot flashes but states they are manageable. Back pain is improved after kyphoplasty.    Of note, he will be going to [place] for the winter. His PCP is Dr. Oneta Rack there.      ROS:  Constitutional: Negative  HEENT: Negative  Respiratory: Negative  Cardiovascular: Negative  Gastrointestinal: Negative  Genitourinary: Negative  Heme/Lymph: Negative  Endocrine: hot flashes  Immunologic: Negative  Musculoskeletal: back pain  Integumentary: Negative  Neurologic: Negative  Psychiatric: Negative  All other ROS: Negative       Allergies:  Allergies (1) Active Reaction  NKA None Documented  ALLERGIES         Home Meds:   Medication List     Active Medications         Documented             acetaminophen: 650 mg, 2 tab(s), PO, q4hr, PRN: Fever/Pain, Mild to               Moderate, 0 Refill(s).             Al hydroxide/Mg hydroxide/simethicone: 30 mL, PO, q4hr, PRN:                Dyspepsia, 0 Refill(s).             ALPRAZolam: See Instructions, 0.25 mg PO TID PRN, PRN: as needed for               anxiety, 0 Refill(s).             aspirin: 81 mg, Chewed, Daily, 0 Refill(s).             calcium carbonate: 600 mg, 1 tab(s), PO, Daily, 0 Refill(s).             cholecalciferol: 1,000 Int_Unit, PO, Daily, 0 Refill(s).             codeine-guaifenesin: 10 mL, PO, q4hr, 10ml twice a day as needed, 0               Refill(s).  docusate: 100 mg, 1 cap(s), PO, BID, PRN: Constipation, 0 Refill(s).             fentaNYL: 25 mcg, q72hr, change q 3 days fentayl 25 mgPartial fill               upon request, 0 Refill(s).             gabapentin: 300 mg, 1 cap(s), PO, BID, 0 Refill(s).             lisinopril: 5 mg, 1 tab(s), PO, Daily, 0 Refill(s).             loperamide: 2 mg, 1 tab(s), PO, q6hr, as needed, 0 Refill(s).             omeprazole: 20 mg, PO, Daily, 0 Refill(s).             ondansetron: See Instructions, 1 tab(s) PO q8hr PRN, 0 Refill(s).             oxyCODONE: 5 mg, PO, as needed, 0 Refill(s).             polyethylene glycol 3350: 17 Gm, PO, Daily, PRN: Constipation, 0               Refill(s).     Medications Inactivated in the Last 72 Hours         bicalutamide: 50 mg, 1 tab(s), PO, q24hr, 0 Refill(s).         metoprolol: 25 mg, 1 cap(s), PO, Daily, 0 Refill(s).        Physical Exam:  Vital signs:  Temperature 96.9  (13:13)  Systolic Blood Pressure 153  (16:10)  Diastolic Blood Pressure 85  (13:13)  Pulse 80  (13:13)  SpO2 97  (13:13)  Respiratory Rate 18  (13:13)    ECOG: 1-2  General: well-appearing elderly male, not in acute distress, in a wheelchair  Head and neck: normocephalic, no masses  Eyes, Ear and Throat: moist mucous membranes, extraocular muscles intact, anicteric sclera  Cardiovascular: regular rate and rhythm, no murmurs, gallops or rubs  Respiratory: clear to auscultation bilaterally, no wheezes or crackles  Abdomen: soft, nontender, nondistended, no  organomegaly  Extremities: warm, perfused, no edema        Labs:  10/29/17  PSA 0.1          Impression and Plan: This is an 87 year old male with a new diagnosis of stage IV prostate cancer, 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     Imaging thus far as well as the high PSA which indicates the diagnosis of stage IV prostate cancer.  He was recommended to have staging scans, CT chest/abdomen/pelvis and bone scan but he canceled these secondary to the cost.  He was started on bicalutamide and then received his first dose of Lupron as few days later.  He  also started [facility] for his bony mets. It is a monthly dose. He tolerated both these injections well. He does have hot flashes from the Lupron. He will continue. Encouraged him to take Ca/Vit D suppl daily. Will check bone density next year.     The bicalutamide was discontinued and patient will be maintained on Lupron alone until progression.      For his pain, he will continue on fentanyl patch and oxycodone as needed.  He was referred to RadOnc to see if there was a role for palliative XRT. He ultimately went for kyphoplasty to his spine.     Recent  PSA showed an excellent response to treatment, currently 0.1. Will continue to trend every 3 months.     I will send records to Dr. Oneta Rack in [place] so patient can be set up to continue treatment down there during the winter months.      Patient is agreeable to plan.  He will return for follow-up in April when he is back in town.   He knows to call with questions/concerns anytime.                      Reviewed and electronically verified by:  Kem Kays MD, Mendel Corning                                                                     on:  11/10/2017 15:59Reviewed and electronically authenticated by:  Micheline Maze                                                                       on:  11/10/17 15:59

## 2020-11-16 NOTE — Progress Notes (Signed)
 [name],     Please understand this has been going on for a couple weeks now. Patient was educated a while ago & [employer] worked to get him free medication. The patient wants to be on this therapy and asked me about it when I gave him Lupron and Xgeva injections. He has already been on it before and needs to restart to control the disease. Getting in touch in order to get info from Robin for his financial info in order to get assistance was very difficult and once it was approved is still diff. To set up delivery. I advised I will be calling her tomorrow 9/13 in order to ensure she has reached out to manufacturer who is providing free medication to set up the delivery. That is my biggest concern is that I don't know if [name] understands what the hold up is as he did express to me the want to take it.    Thanks  Jill Poling

## 2020-11-16 NOTE — Telephone Encounter (Signed)
 Call to Robin per chemo educator concerns of delay in obtaining medication.  [name] reports that her father is away through Monday, but is not rushed to get medication until he knows it is covered.  She described him as sometimes stubborn and does not see urgency to getting the oral medication as he has gotten his shots.  SW offered to call next week to assess further desire to pursue tx. She feels her father sometimes is "tired" and has alot of other conditions, and gets VNA services at home.

## 2020-11-16 NOTE — Radiation Clinical Support Note (Deleted)
[email or web address] Intake Entered On:  10/02/2017 17:19     Performed On:  10/02/2017 15:10  by Ladell Pier RN, Jasmine December Information - Med-Onc   Preferred Language :   [place]   Fall Risk Assessment :   Yes   Have you been hospitalized since your last visit? :   Yes   Pain Symptoms :   Yes   Have you had any tests SLV :   Yes   VAD Monthly Flush :   No   Cancer Fatigue Scale :   4-Moderate fatigue, need to take breaks on occasion   Cancer Fatigue Score :   4    [place] RNRaynelle Fanning - 10/02/2017 17:17    Allergies   (As Of: 10/02/2017 17:19:41 )   Allergies (Active)   NKA  Estimated Onset Date:   Unspecified ; Created By:   Ronnie Doss; Reaction Status:   Active ; Category:   Drug ; Substance:   NKA ; Type:   Allergy ; Updated By:   Ronnie Doss; Reviewed Date:   10/02/2017 17:17         Medication History   Medication List   (As Of: 10/02/2017 17:19:41 )   Normal Order    calcium gluconate concentrate  :   calcium gluconate concentrate ; Status:   Completed ; Ordered As Mnemonic:   calcium gluconate ; Simple Display Line:   1 Gm, 285 ml/hr, IV Piggyback, Once ; Ordering Provider:   Kem Kays MD, Mendel Corning; Catalog Code:   calcium gluconate ; Order Dt/Tm:   10/02/2017 15:19:08          denosumab 120 mg/1.7 mL  :   denosumab 120 mg/1.7 mL ; Status:   Completed ; Ordered As Mnemonic:   Xgeva ; Simple Display Line:   120 mg, 1.7 mL, sc, Once ; Ordering Provider:   Kem Kays MD, Mendel Corning; Catalog Code:   denosumab ; Order Dt/Tm:   10/02/2017 15:26:21 ; Comment:   q4week treatment            Home Meds    cholecalciferol  :   cholecalciferol ; Status:   Documented ; Ordered As Mnemonic:   Vitamin D3 ; Simple Display Line:   0 Refill(s) ; Catalog Code:   cholecalciferol ; Order Dt/Tm:   10/02/2017 17:19:08          calcium carbonate  :   calcium carbonate ; Status:   Documented ; Ordered As Mnemonic:   calcium (as carbonate) 600 mg oral tablet ; Simple Display Line:   600 mg, 1 tab(s), PO, Daily, 0 Refill(s)  ; Catalog Code:   calcium carbonate ; Order Dt/Tm:   10/02/2017 17:18:41          oxyCODONE  :   oxyCODONE ; Status:   Documented ; Ordered As Mnemonic:   oxyCODONE ; Simple Display Line:   5 mg, PO, as needed, 0 Refill(s) ; Catalog Code:   oxyCODONE ; Order Dt/Tm:   09/29/2017 15:24:05          cholecalciferol  :   cholecalciferol ; Status:   Documented ; Ordered As Mnemonic:   Vitamin D3 ; Simple Display Line:   1,000 Int_Unit, PO, Daily, 0 Refill(s) ; Catalog Code:   cholecalciferol ; Order Dt/Tm:   09/04/2017 14:19:46          codeine-guaifenesin  :   codeine-guaifenesin ; Status:   Documented ;  Ordered As Mnemonic:   Robitussin AC ; Simple Display Line:   10 mL, PO, q4hr, 10ml twice a day as needed, 0 Refill(s) ; Catalog Code:   codeine-guaifenesin ; Order Dt/Tm:   08/14/2017 11:47:36          polyethylene glycol 3350  :   polyethylene glycol 3350 ; Status:   Documented ; Ordered As Mnemonic:   MiraLax oral powder for reconstitution ; Simple Display Line:   17 Gm, PO, Daily, PRN: Constipation, 0 Refill(s) ; Catalog Code:   polyethylene glycol 3350 ; Order Dt/Tm:   08/14/2017 11:47:11          ondansetron  :   ondansetron ; Status:   Documented ; Ordered As Mnemonic:   Zofran 4 mg oral tablet ; Simple Display Line:   See Instructions, 1 tab(s) PO q8hr PRN, 0 Refill(s) ; Catalog Code:   ondansetron ; Order Dt/Tm:   08/14/2017 11:46:16          metoprolol  :   metoprolol ; Status:   Documented ; Ordered As Mnemonic:   metoprolol succinate 25 mg oral capsule, extended release ; Simple Display Line:   25 mg, 1 cap(s), PO, Daily, 0 Refill(s) ; Catalog Code:   metoprolol ; Order Dt/Tm:   08/14/2017 11:45:01          omeprazole  :   omeprazole ; Status:   Documented ; Ordered As Mnemonic:   omeprazole ; Simple Display Line:   20 mg, PO, Daily, 0 Refill(s) ; Catalog Code:   omeprazole ; Order Dt/Tm:   08/14/2017 11:45:57          fentaNYL  :   fentaNYL ; Status:   Documented ; Ordered As Mnemonic:   fentaNYL ; Simple  Display Line:   25 mcg, q72hr, change q 3 days fentayl 25 mgPartial fill upon request, 0 Refill(s) ; Catalog Code:   fentaNYL ; Order Dt/Tm:   08/14/2017 11:39:32          loperamide  :   loperamide ; Status:   Documented ; Ordered As Mnemonic:   loperamide 2 mg oral tablet ; Simple Display Line:   2 mg, 1 tab(s), PO, q6hr, as needed, 0 Refill(s) ; Catalog Code:   loperamide ; Order Dt/Tm:   08/14/2017 11:38:26          bicalutamide  :   bicalutamide ; Status:   Documented ; Ordered As Mnemonic:   bicalutamide 50 mg oral tablet ; Simple Display Line:   50 mg, 1 tab(s), PO, q24hr, 0 Refill(s) ; Catalog Code:   bicalutamide ; Order Dt/Tm:   08/14/2017 11:37:59          acetaminophen  :   acetaminophen ; Status:   Documented ; Ordered As Mnemonic:   acetaminophen 325 mg oral tablet ; Simple Display Line:   650 mg, 2 tab(s), PO, q4hr, PRN: Fever/Pain, Mild to Moderate, 0 Refill(s) ; Ordering Provider:   Susette Racer MD, Collene Leyden; Catalog Code:   acetaminophen ; Order Dt/Tm:   07/22/2017 08:43:08          Al hydroxide/Mg hydroxide/simethicone  :   Al hydroxide/Mg hydroxide/simethicone ; Status:   Documented ; Ordered As Mnemonic:   aluminum hydroxide/magnesium hydroxide/simethicone 200 mg-200 mg-20 mg/5 mL oral suspension ; Simple Display Line:   30 mL, PO, q4hr, PRN: Dyspepsia, 0 Refill(s) ; Ordering Provider:   Susette Racer MD, Collene Leyden; Catalog Code:   Al hydroxide/Mg hydroxide/simethicone ; Order Dt/Tm:  07/22/2017 08:43:18          docusate  :   docusate ; Status:   Documented ; Ordered As Mnemonic:   docusate sodium 100 mg oral capsule ; Simple Display Line:   100 mg, 1 cap(s), PO, BID, PRN: Constipation, 0 Refill(s) ; Ordering Provider:   Susette Racer MD, Collene Leyden; Catalog Code:   docusate ; Order Dt/Tm:   07/22/2017 08:43:25          lisinopril  :   lisinopril ; Status:   Documented ; Ordered As Mnemonic:   lisinopril 5 mg oral tablet ; Simple Display Line:   5 mg, 1 tab(s), PO, Daily, 0 Refill(s) ; Ordering Provider:   Susette Racer  MD, Collene Leyden; Catalog Code:   lisinopril ; Order Dt/Tm:   07/22/2017 08:43:40          aspirin  :   aspirin ; Status:   Documented ; Ordered As Mnemonic:   aspirin ; Simple Display Line:   81 mg, Chewed, Daily, 0 Refill(s) ; Catalog Code:   aspirin ; Order Dt/Tm:   07/18/2017 08:40:53          gabapentin  :   gabapentin ; Status:   Documented ; Ordered As Mnemonic:   gabapentin 300 mg oral capsule ; Simple Display Line:   300 mg, 1 cap(s), PO, BID, 0 Refill(s) ; Catalog Code:   gabapentin ; Order Dt/Tm:   07/12/2017 18:01:00          ALPRAZolam  :   ALPRAZolam ; Status:   Documented ; Ordered As Mnemonic:   Xanax ; Simple Display Line:   See Instructions, 0.25 mg PO TID PRN, PRN: as needed for anxiety, 0 Refill(s) ; Catalog Code:   ALPRAZolam ; Order Dt/Tm:   09/07/2015 07:07:23            Social History   Current Smoking Status :   Former smoker   Did the patient smoke (past 12 months) :   No tobacco smoking   Patient used other tobacco products in the last 30 days? :   No   Environmental education officer - 10/02/2017 17:17    Social History   (As Of: 10/02/2017 17:19:41 )   Tobacco:        Former smoker   (Last Updated: 10/19/2015 07:51:16  by Konrad Penta RN, Johnny Bridge)          Alcohol:        None   (Last Updated: 10/19/2015 07:51:23  by Konrad Penta RN, Johnny Bridge)          Employment/School:        Retired   (Last Updated: 08/14/2017 11:40:49  by Margaretha Glassing RN, [name])          Nutrition/Health:        Regular   (Last Updated: 08/14/2017 11:41:02  by Margaretha Glassing RN, [name])          Substance Abuse:        Never   (Last Updated: 10/19/2015 07:51:50  by Konrad Penta RN, Johnny Bridge)          Home/Environment:        Lives with Children.   Comments:  08/14/2017 11:49 - Margaretha Glassing RN, [name]: lives with daughter and brother has 7 children   (Last Updated: 08/14/2017 11:49:19  by Margaretha Glassing RN, [name])            ONC Nutrition   MST Patient Able to Complete Assessment :   Yes   MST Score :  0    MST Lose Weight Without Trying :   No   Home Diet :   Regular   Appetite :   Good    Feeding Ability :   Complete independence   Environmental education officer - 10/02/2017 17:17    Primary Pain   Primary Pain Location :   Lower back   Pain Scale Used :   0-10   Pain Score :   3    Primary Pain Interventions :   Pain medication previously taken   [place] RN, Raynelle Fanning - 10/02/2017 17:17    Image 1 -  Images currently included in the form version of this document have not been included in the text rendition version of the form.

## 2020-11-16 NOTE — Radiation Clinical Support Note (Deleted)
[email or web address] Nurse- Intake Entered On:  09/15/2017 15:23     Performed On:  09/15/2017 15:19  by Margaretha Glassing RN, [name]               Vital Signs   Pain Scale Used :   0-10   Temperature Oral :   97.4 DegF   Peripheral Pulse Rate :   52 bpm   Respiratory Rate :   20 br/min   Systolic Blood Pressure :   161 mmHg (HI)    Diastolic Blood Pressure :   78 mmHg   Blood Pressure Extremity :   Right arm   Mean Arterial Pressure :   106 mmHg   SpO2 :   97 %   Oxygen Therapy 1 :   Room air   [name], [name] - 09/15/2017 15:19    Height/Weight   Height :   170 cm(Converted to: 5 foot 7 in, 66.93 in)    Body Surface Area :   1.86 m2   Weight :   75 Kg(Converted to: 165 lb 6 oz)    Body Mass Index :   25.95 Kg/m2   Type of Weight :   Stated weight   Type of Height :   Stated height   Max Fickle, [name] - 09/15/2017 15:19    [facility] General Information/Subjective   Chief Complaint :   follow up prostate cancer  had recent kyphoplasty   High Risk for Falls :   Yes   Cancer Fatigue Scale :   6-Severe fatigue, interferes with ADLs, need breaks often   Cancer Fatigue Score :   6    Preferred Language :   English   Preferred Communication Mode :   Verbal   Pain Symptoms :   Yes   Max Fickle, [name] - 09/15/2017 15:19    Patient Preferred Method of Communication   Phone Call   Primary Pain   Primary Pain Location :   Lower back   Pain Scale Used :   0-10   Pain Score :   5    Primary Pain Interventions :   Medications   Max Fickle, [name] - 09/15/2017 15:19    Image 1 -  Images currently included in the form version of this document have not been included in the text rendition version of the form.   [facility] Infectious Disease Risk Screening   Patient Travel History :   No recent travel   Recent Family Travel History :   No recent travel   Max Fickle, [name] - 09/15/2017 15:19    Allergy   (As Of: 09/15/2017 15:23:44 )   Allergies (Active)   NKA  Estimated Onset Date:   Unspecified ; Created By:   Ronnie Doss; Reaction Status:   Active ; Category:   Drug ;  Substance:   NKA ; Type:   Allergy ; Updated By:   Ronnie Doss; Reviewed Date:   09/15/2017 15:21         Medication History   Medication List   (As Of: 09/15/2017 15:23:44 )   Home Meds    cholecalciferol  :   cholecalciferol ; Status:   Documented ; Ordered As Mnemonic:   Vitamin D3 ; Simple Display Line:   1,000 Int_Unit, PO, Daily, 0 Refill(s) ; Catalog Code:   cholecalciferol ; Order Dt/Tm:   09/04/2017 14:19:46          codeine-guaifenesin  :   codeine-guaifenesin ; Status:  Documented ; Ordered As Mnemonic:   Robitussin AC ; Simple Display Line:   10 mL, PO, q4hr, 10ml twice a day as needed, 0 Refill(s) ; Catalog Code:   codeine-guaifenesin ; Order Dt/Tm:   08/14/2017 11:47:36          docusate  :   docusate ; Status:   Documented ; Ordered As Mnemonic:   docusate sodium 100 mg oral capsule ; Simple Display Line:   100 mg, 1 cap(s), PO, BID, PRN: Constipation, 0 Refill(s) ; Ordering Provider:   Susette Racer MD, Collene Leyden; Catalog Code:   docusate ; Order Dt/Tm:   07/22/2017 08:43:25          polyethylene glycol 3350  :   polyethylene glycol 3350 ; Status:   Documented ; Ordered As Mnemonic:   MiraLax oral powder for reconstitution ; Simple Display Line:   17 Gm, PO, Daily, PRN: Constipation, 0 Refill(s) ; Catalog Code:   polyethylene glycol 3350 ; Order Dt/Tm:   08/14/2017 11:47:11          loperamide  :   loperamide ; Status:   Documented ; Ordered As Mnemonic:   loperamide 2 mg oral tablet ; Simple Display Line:   2 mg, 1 tab(s), PO, q6hr, as needed, 0 Refill(s) ; Catalog Code:   loperamide ; Order Dt/Tm:   08/14/2017 11:38:26          omeprazole  :   omeprazole ; Status:   Documented ; Ordered As Mnemonic:   omeprazole ; Simple Display Line:   20 mg, PO, Daily, 0 Refill(s) ; Catalog Code:   omeprazole ; Order Dt/Tm:   08/14/2017 11:45:57          fentaNYL  :   fentaNYL ; Status:   Documented ; Ordered As Mnemonic:   fentaNYL ; Simple Display Line:   25 mcg, q72hr, change q 3 days fentayl 25 mgPartial fill  upon request, 0 Refill(s) ; Catalog Code:   fentaNYL ; Order Dt/Tm:   08/14/2017 11:39:32          metoprolol  :   metoprolol ; Status:   Documented ; Ordered As Mnemonic:   metoprolol succinate 25 mg oral capsule, extended release ; Simple Display Line:   25 mg, 1 cap(s), PO, Daily, 0 Refill(s) ; Catalog Code:   metoprolol ; Order Dt/Tm:   08/14/2017 11:45:01          Al hydroxide/Mg hydroxide/simethicone  :   Al hydroxide/Mg hydroxide/simethicone ; Status:   Documented ; Ordered As Mnemonic:   aluminum hydroxide/magnesium hydroxide/simethicone 200 mg-200 mg-20 mg/5 mL oral suspension ; Simple Display Line:   30 mL, PO, q4hr, PRN: Dyspepsia, 0 Refill(s) ; Ordering Provider:   Susette Racer MD, Collene Leyden; Catalog Code:   Al hydroxide/Mg hydroxide/simethicone ; Order Dt/Tm:   07/22/2017 08:43:18          gabapentin  :   gabapentin ; Status:   Documented ; Ordered As Mnemonic:   gabapentin 300 mg oral capsule ; Simple Display Line:   300 mg, 1 cap(s), PO, BID, 0 Refill(s) ; Catalog Code:   gabapentin ; Order Dt/Tm:   07/12/2017 18:01:00          lisinopril  :   lisinopril ; Status:   Documented ; Ordered As Mnemonic:   lisinopril 5 mg oral tablet ; Simple Display Line:   5 mg, 1 tab(s), PO, Daily, 0 Refill(s) ; Ordering Provider:   Susette Racer MD, Collene Leyden; Catalog Code:  lisinopril ; Order Dt/Tm:   07/22/2017 08:43:40          bicalutamide  :   bicalutamide ; Status:   Documented ; Ordered As Mnemonic:   bicalutamide 50 mg oral tablet ; Simple Display Line:   50 mg, 1 tab(s), PO, q24hr, 0 Refill(s) ; Catalog Code:   bicalutamide ; Order Dt/Tm:   08/14/2017 11:37:59          acetaminophen  :   acetaminophen ; Status:   Documented ; Ordered As Mnemonic:   acetaminophen 325 mg oral tablet ; Simple Display Line:   650 mg, 2 tab(s), PO, q4hr, PRN: Fever/Pain, Mild to Moderate, 0 Refill(s) ; Ordering Provider:   Susette Racer MD, Collene Leyden; Catalog Code:   acetaminophen ; Order Dt/Tm:   07/22/2017 08:43:08          aspirin  :   aspirin ;  Status:   Documented ; Ordered As Mnemonic:   aspirin ; Simple Display Line:   81 mg, Chewed, Daily, 0 Refill(s) ; Catalog Code:   aspirin ; Order Dt/Tm:   07/18/2017 08:40:53          ondansetron  :   ondansetron ; Status:   Documented ; Ordered As Mnemonic:   Zofran 4 mg oral tablet ; Simple Display Line:   See Instructions, 1 tab(s) PO q8hr PRN, 0 Refill(s) ; Catalog Code:   ondansetron ; Order Dt/Tm:   08/14/2017 11:46:16          ALPRAZolam  :   ALPRAZolam ; Status:   Documented ; Ordered As Mnemonic:   Xanax ; Simple Display Line:   See Instructions, 0.25 mg PO TID PRN, PRN: as needed for anxiety, 0 Refill(s) ; Catalog Code:   ALPRAZolam ; Order Dt/Tm:   09/07/2015 07:07:23            [facility] Social History   Cigarette Smoking Last 365 Days :   No   Exposure to Tobacco Smoke :   Former smoker   Patient used other tobacco products in the last 30 days? :   No   Max Fickle, [name] - 09/15/2017 15:19    Social History   (As Of: 09/15/2017 15:23:44 )   Tobacco:        Former smoker   (Last Updated: 10/19/2015 07:51:16  by Konrad Penta RN, Johnny Bridge)          Alcohol:        None   (Last Updated: 10/19/2015 07:51:23  by Konrad Penta RN, Johnny Bridge)          Employment/School:        Retired   (Last Updated: 08/14/2017 11:40:49  by Margaretha Glassing RN, [name])          Nutrition/Health:        Regular   (Last Updated: 08/14/2017 11:41:02  by Margaretha Glassing RN, [name])          Substance Abuse:        Never   (Last Updated: 10/19/2015 07:51:50  by Konrad Penta RN, Johnny Bridge)          Home/Environment:        Lives with Children.   Comments:  08/14/2017 11:49 - Margaretha Glassing RN, [name]: lives with daughter and brother has 7 children   (Last Updated: 08/14/2017 11:49:19  by Margaretha Glassing RN, [name])            Advance Directive   Advanced Directives :   No       Max Fickle, [name] -  09/15/2017 15:24      [facility] ONC Nutrition   Home Diet :   Regular   Appetite :   Annamaria Boots RN, [name] - 09/15/2017 15:19    [facility] Encounter   Onc Visit Level, Existing :   Level 1   Max Fickle, [name] - 09/17/2017 13:22    Onc Type  of Patient :   Outpatient Visit Existing   Max Fickle, [name] - 09/15/2017 15:19

## 2020-11-19 ENCOUNTER — Other Ambulatory Visit

## 2020-11-20 ENCOUNTER — Telehealth (HOSPITAL_BASED_OUTPATIENT_CLINIC_OR_DEPARTMENT_OTHER): Admitting: Internal Medicine

## 2020-11-20 ENCOUNTER — Other Ambulatory Visit

## 2020-11-20 ENCOUNTER — Ambulatory Visit: Admit: 2020-11-20 | Discharge: 2020-11-20 | Payer: MEDICARE | Attending: Nurse Practitioner | Primary: Internal Medicine

## 2020-11-20 ENCOUNTER — Encounter (HOSPITAL_BASED_OUTPATIENT_CLINIC_OR_DEPARTMENT_OTHER): Admitting: Internal Medicine

## 2020-11-20 ENCOUNTER — Encounter
Admit: 2020-11-20 | Discharge: 2020-11-20 | Payer: MEDICARE | Attending: Rehabilitative and Restorative Service Providers" | Primary: Internal Medicine

## 2020-11-20 ENCOUNTER — Encounter (INDEPENDENT_AMBULATORY_CARE_PROVIDER_SITE_OTHER): Admitting: Nurse Practitioner

## 2020-11-20 ENCOUNTER — Telehealth (INDEPENDENT_AMBULATORY_CARE_PROVIDER_SITE_OTHER)

## 2020-11-20 VITALS — BP 126/78 | HR 59 | Temp 97.5°F | Ht 67.0 in | Wt 179.8 lb

## 2020-11-20 DIAGNOSIS — I483 Typical atrial flutter: Secondary | ICD-10-CM

## 2020-11-20 DIAGNOSIS — I69393 Ataxia following cerebral infarction: Secondary | ICD-10-CM | POA: Diagnosis not present

## 2020-11-20 DIAGNOSIS — S32000D Wedge compression fracture of unspecified lumbar vertebra, subsequent encounter for fracture with routine healing: Secondary | ICD-10-CM | POA: Diagnosis not present

## 2020-11-20 DIAGNOSIS — I10 Essential (primary) hypertension: Secondary | ICD-10-CM | POA: Diagnosis not present

## 2020-11-20 DIAGNOSIS — I130 Hypertensive heart and chronic kidney disease with heart failure and stage 1 through stage 4 chronic kidney disease, or unspecified chronic kidney disease: Secondary | ICD-10-CM | POA: Diagnosis not present

## 2020-11-20 DIAGNOSIS — Z8673 Personal history of transient ischemic attack (TIA), and cerebral infarction without residual deficits: Secondary | ICD-10-CM | POA: Diagnosis not present

## 2020-11-20 DIAGNOSIS — C61 Malignant neoplasm of prostate: Secondary | ICD-10-CM | POA: Diagnosis not present

## 2020-11-20 DIAGNOSIS — W109XXD Fall (on) (from) unspecified stairs and steps, subsequent encounter: Secondary | ICD-10-CM | POA: Diagnosis not present

## 2020-11-20 DIAGNOSIS — I69322 Dysarthria following cerebral infarction: Secondary | ICD-10-CM | POA: Diagnosis not present

## 2020-11-20 DIAGNOSIS — T451X5D Adverse effect of antineoplastic and immunosuppressive drugs, subsequent encounter: Secondary | ICD-10-CM | POA: Diagnosis not present

## 2020-11-20 DIAGNOSIS — C7951 Secondary malignant neoplasm of bone: Secondary | ICD-10-CM | POA: Diagnosis not present

## 2020-11-20 DIAGNOSIS — I5032 Chronic diastolic (congestive) heart failure: Secondary | ICD-10-CM | POA: Diagnosis not present

## 2020-11-20 DIAGNOSIS — G620 Drug-induced polyneuropathy: Secondary | ICD-10-CM | POA: Diagnosis not present

## 2020-11-20 DIAGNOSIS — E785 Hyperlipidemia, unspecified: Secondary | ICD-10-CM | POA: Diagnosis not present

## 2020-11-20 NOTE — Progress Notes (Signed)
 Reason for visit:   HFU: CVA 8/22  Hx of Atrial flutter s/p CV on 2017 and went off anticoagulation    HPI: [name]. is a 87 y.o. male who presents for for hospital follow-up and to establish care in our practice.  He was [facility]    He has a history of atrial flutter status post cardioversion in 2017, echo at that time showed LVEF 20%.  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   He was sent to hospital in July from palliative meeting with word finding difficulties and MR brain confirmed?left MCA territory acute ischemic infarct without hemorrhage or mass-effect. ?There was absent mid to distal right posterior cerebral arterial flow likely severe/critical stenosis and less likely occlusion. ?MRA of the neck showed no cervical arterial occlusion high-grade stenosis or dissection.   There was no evidence of atrial arrhythmia on monitor during his hospital stay.  He was discharged on 30-day event monitor that he brought in today to return.     He was started on dual antiplatelet therapy for CVA  [facility] and received Lupron and gets it every 3 months and Xgeva every month. Planning to resume Xtandi.     Still has slurred speech and word finding difficulty. No legs or arms weakness from CVA. No difficulty swallowing food. He was not on anticoagulation when presented with CVA.    Limited activity due to back pain. Walks with cane. No fall.     Chronic fatigue. No blood in stool or urine or melena. No chest pain, worseing dyspnea, palpitation, orthopnea, PND, lightheadedness, or edema. Appetite up and down.   ?  PMH:  1.  Left MCA distribution stroke  2.  Metastatic prostate cancer  3.  Asymptomatic sinus bradycardia  4.  History of atrial flutter requiring cardioversion in 2017, [facility]  5.  He has a history of LV systolic dysfunction  in the setting of atrial flutter with LVEF of 25% 2017>> 65% 7/22  ?  MEDICATIONS:   Current Outpatient Medications:   ?  acetaminophen (Tylenol) 325 mg capsule, Take 650 mg by mouth every 6 (six) hours if needed for pain score 1-3., Disp: , Rfl:   ?  aspirin 81 mg capsule, Take 81 mg by mouth in the morning., Disp: , Rfl:   ?  atorvastatin (Lipitor) 80 mg tablet, Take 1 tablet (80 mg) by mouth at bedtime., Disp: 30 tablet, Rfl: 11  ?  calcium carbonate-vitamin D3 500 mg-5 mcg (200 unit) tablet, Take 1 tablet by mouth in the morning., Disp: , Rfl:   ?  clopidogrel (Plavix) 75 mg tablet, Take 1 tablet (75 mg) by mouth in the morning., Disp: 30 tablet, Rfl: 11  ?  cyanocobalamin, vitamin B-12, (VITAMIN B-12 ORAL), 1 (one) time each day., Disp: , Rfl:   ?  gabapentin (Neurontin) 600 mg tablet, Take 600 mg by mouth if needed in the morning, at noon, and at bedtime., Disp: , Rfl:   ?  hydrALAZINE (Apresoline) 25 mg tablet, Take by mouth., Disp: , Rfl:   ?  losartan (Cozaar) 50 mg tablet, Take 50 mg by mouth in the morning., Disp: , Rfl:   ?  melatonin 3 mg tablet, Take by mouth at bedtime., Disp: , Rfl:   ?  metoprolol tartrate (Lopressor) 25 mg tablet, Take by mouth in the  morning and at bedtime., Disp: , Rfl:   ?  omega-3 (Fish Oil) 300-1,000 mg capsule, Take 1,000 mg by mouth 3 (three) times a week., Disp: , Rfl:   ?  pantoprazole (ProtoNix) 40 mg EC tablet, Take 40 mg by mouth before breakfast. Do not crush, chew, or split., Disp: , Rfl:   ?  traZODone (Desyrel) 50 mg tablet, Take 50 mg by mouth at bedtime., Disp: , Rfl:   ?  furosemide (Lasix) 40 mg tablet, Take by mouth., Disp: , Rfl:     No Known Allergies     ROS: ROS   Chronic back pain and dyspnea with moderate exertion. Denies chest pain, palpitation, orthopnea, PND, lightheadedness, presyncope, syncope, or fall.   No blood in stool or urine or melena.  Denies nausea or vomiting or abdominal pain  Denies fever, chills, or night sweats    EXAM: 87 y.o. male in  NAD  Vitals:    11/20/20 0936   BP: 126/78   Pulse: 59   Temp: 36.4 ?C (97.5 ?F)   SpO2: 97%   Weight: 81.6 kg   Height: 1.702 m     @WEIGHTCHANGE7DAY @  Physical Exam    Physical Exam  Alert and oriented x3, no acute distress, accompanied by daughter  Neck: No elevated JVP, No carotid bruit  Lungs: Clear to auscultation bilaterally, respirations unlabored and equal air movement  Heart: Regular without S3, S4.  PMI not felt, No murmur rubs or gallop.  Abdomen: Soft and nontender,+ bowel sounds  Extremities: trace edema BL, no clubbing, or cyanosis, palpable pulses  Neuro: slurred speech, Grossly non focal  Skin: warm, dry, and intact without rashes  Psych: Alert and oriented x3, mood appropriate      DATA:   EKG:   >> 11/20/20: SR 59 bpm, PAC, PRWP, NSSTT  ECHO:  >> 7/22: LVEF 65%.  Borderline concentric LVH.  Basal posterior wall mild hypokinesis.  Moderate hypokinesis of basal inferior wall.  Normal RV size and function.  No ASD.  Thickened mitral valve leaflet without functional abnormality.  Trileaflet aortic valve with trace AI.  PASP 16 mmHg plus right atrial pressure.  >> 2017: LVEF 25%     MRA neck: 7/22: No cervical arterial occlusion, high-grade stenosis, or evidence of dissection.    ?    Lab Results   Component Value Date    GLUCOSE 101 10/30/2020    CALCIUM 9.7 10/30/2020    NA 138 10/30/2020    K 4.6 10/30/2020    CO2 21 10/30/2020    CL 108 10/30/2020    BUN 23 10/30/2020    CREATININE 1.64 (H) 10/30/2020     Lab Results   Component Value Date    CHOL 208 (H) 10/03/2020    HDL 52 10/03/2020    LDLCALC 117 10/03/2020    TRIG 197 (H) 10/03/2020     Lab Results   Component Value Date    HGBA1C 5.5 10/03/2020    HGBA1C 5.5 10/03/2020       IMP/PLAN:  Atrial flutter  History of atrial flutter status post cardioversion.  At some point, he went off anticoagulation.  He recently presented to hospital with word finding difficulty and MR brain confirmed?left MCA territory acute ischemic infarct without hemorrhage  or mass-effect. ?There was absent mid to distal right posterior cerebral arterial flow likely severe/critical stenosis and less likely occlusion. ?MRA of the neck showed no cervical arterial occlusion high-grade stenosis or dissection.   There was  no evidence of atrial arrhythmia on monitor during his hospital stay.  He was discharged on 30-day event monitor that he brought in today to return.  If there is any evidence of paroxysmal atrial flutter or fibrillation, will resume Eliquis for CVA prophylaxis.  Based on his weight and age the recommended dose for him will be 2.5 mg twice daily.    At that time, will have to discontinue one of the antiplatelet after discussion with neurology.  At present, he is on aspirin and Plavix without bleeding complication.   He continues to have slurred speech and word finding difficulties without upper or lower extremity weakness.    CVA  As above.  Follow-up with neurology.    Chronic diastolic heart failure  NYHA class II. No HF on exam today. Continue furosemide 40mg  daily. Continue low salt diet.     Hypertension  Well-controlled on current medication.     Hyperlipidemia  Continue statin    CKD  Creatinine 1.6    Prostate Ca  Followed by Dr. Kem Kays    Problem List Items Addressed This Visit    None     Visit Diagnoses     Typical atrial flutter (CMS/HCC)    -  Primary    Relevant Medications    metoprolol tartrate (Lopressor) 25 mg tablet    Other Relevant Orders    ECG 12 lead (Completed)    Cerebrovascular accident (CVA) within last 3 months        Chronic diastolic heart failure (CMS/HCC)        Relevant Medications    metoprolol tartrate (Lopressor) 25 mg tablet    Hyperlipidemia LDL goal <70        Primary hypertension        Relevant Medications    metoprolol tartrate (Lopressor) 25 mg tablet    hydrALAZINE (Apresoline) 25 mg tablet

## 2020-11-20 NOTE — Telephone Encounter (Signed)
 He should continue what he is taking now.

## 2020-11-20 NOTE — Home Health (Signed)
 Patient seen for OT discipline discharge. Patient presenting without any complaints of pain or discomfort. Patient stating that he is able to perform shower tasks and ADL's independently. Patient educated in regards to [facility]  and DME to facilitate independence with ADL tasks. Patient verbalized understanding.  Patient also educated in regards to compensatory cognitive  strategies due to impaired memory.  Patient ambulated approximately 10 feet after performing toileting tasks and presenting with heavy breathing. Patient educated in regards to PLB'ing and pacing. Oxygen measured at 99. Patient discharged from OT services as he has met all goals.

## 2020-11-20 NOTE — Progress Notes (Signed)
 Hi Jess,      [name] is aware to get labs before taking meds. She did mention that to concerning the labs me when she confirmed delivery.      Thanks,   Adwoa      ----- Message -----   From: Marilynne Drivers, RN   Sent: 11/20/2020  2:11 PM EDT   To: Micheline Maze, MD, Johnney Ou, RN, *   Subject: RE: update please                    PAtient has still not had labs, attempted to call [name] and phone call was ignored. Called his other sister [name] who is returning to the same house as them this afternoon and stressed the importance of [name] or [name] calling me back. [name] confirmed she would give the message. [name] has been told on multiple calls that he cannot start without labs. Otilio Connors here with me during call to [facility]. She is informed of the issues that have arose in coordinating [name]'s care.      Jess B   ----- Message -----   From: Jonnie Kind, PharmD   Sent: 11/20/2020  1:23 PM EDT   To: Tenna Child, Micheline Maze, MD, *   Subject: RE: update please                    Hi,       I spoke with [name] and she was able to schedule delivery for Wednesday 11/21/20 with [facility](free drug pharmacy via manufacturer).      I informed [name] to reach out to [facility] pharmacy when pt has 7 days left of medication to coordinate delivery.      [name] has my number to reach out with any issues.      Thanks,   Adwoa      ----- Message -----   From: Marilynne Drivers, RN   Sent: 11/16/2020  1:38 PM EDT   To: Tenna Child, Micheline Maze, MD, *   Subject: RE: update please                    I just spoke with [name] & she is hesitant to set up delivery because they are so busy with appts. I stressed the importance and advised to have labs Monday and I will call tuesday to ensure she has set up delivery. Note sent to SW dept as well to alert.      Jess B   ----- Message -----   From: Jonnie Kind, PharmD   Sent: 11/16/2020 12:39 PM EDT   To: Tenna Child, Micheline Maze,  MD, *   Subject: RE: update please                    Hi Beth      I have reached out to [facility] for over a week to coordinate delivery and copay assistance that provides medication has left multiple messages for Robin.       I have received no call back from Robin and neither has manufacturer.      I left another message for [name] for delivery.      Thanks    Adwoa   ----- Message -----   From: Johnney Ou, RN   Sent: 11/16/2020 12:14 PM EDT   To: Lorel Monaco, MD, *   Subject: update please  Adwoa, do you have an update regarding medication delivery/coverage status for this patient?   Thanks,       beth   ----- Message -----   From: Marilynne Drivers, RN   Sent: 11/13/2020 11:43 AM EDT   To: Tenna Child, Micheline Maze, MD, *      Marianna Payment,      We still have no further info on Mr Dukes's assistance. Please advise as he does need to start therapy.      Thanks,   Jess B   ----- Message -----   From: Jonnie Kind, PharmD   Sent: 11/07/2020  9:36 AM EDT   To: Tenna Child, Micheline Maze, MD, *      Hi Beth      I will be reaching out to foundation this morning for a status update. I will notify you and pt with an update.      Thanks   adwoa   ----- Message -----   From: Johnney Ou, RN   Sent: 11/07/2020  9:06 AM EDT   To: Tenna Child, Micheline Maze, MD, *      Hi Adwoa!      Do we have any updates regarding copay assistance for this patient?      Please advise.    Beth   ----- Message -----   From: Marilynne Drivers, RN   Sent: 11/05/2020  2:02 PM EDT   To: Tenna Child, Micheline Maze, MD, *      Hi Adwoa,      Looking for further updates on fin. Assistance for patient.      Thanks!   Jess B   ----- Message -----   From: Jonnie Kind, PharmD   Sent: 10/30/2020 12:46 PM EDT   To: Tenna Child, Micheline Maze, MD, *      Hi       Pt application is still pending review with manufacturer and has bout 2 week turn around. We are at about day 8. I  will reach out to manufacture and pt with update today      Thanks   Adwoa   ----- Message -----   From: Marilynne Drivers, RN   Sent: 10/30/2020 11:32 AM EDT   To: Tenna Child, Micheline Maze, MD, *      Hi Adwoa,      Patient in for education today. Any updates on this coverage?      Thanks   Jess B   ----- Message -----   From: Jonnie Kind, PharmD   Sent: 10/22/2020  4:55 PM EDT   To: Tenna Child, Micheline Maze, MD, *      Hi Jess      I did get everything i needed and application was submitted to manufacture. This has about 7-14 day turn around after review      I will check on application status on Friday.       Thanks,   Adwoa   ----- Message -----   From: Marilynne Drivers, RN   Sent: 10/22/2020  3:14 PM EDT   To: Tenna Child, Micheline Maze, MD, *      Marianna Payment,      Did you get everything you need for this copay assistance and is delivery set? Patient will be educated this week.      Thanks!      Jess B   ----- Message -----   From: Laurelyn Sickle, NP   Sent: 10/17/2020 10:18 PM EDT   To: Delice Bison  Lattie Haw, MD, *      Great! Thanks :)   ----- Message -----   From: Marilynne Drivers, RN   Sent: 10/17/2020  3:27 PM EDT   To: Tenna Child, Micheline Maze, MD, *      Spoke with [name] (daughter). She will bring patient to be educated 8/18 and plans to call Adwoa back with SSN for dad today to process copay assistance.      Jess B   ----- Message -----   From: Jonnie Kind, PharmD   Sent: 10/16/2020 10:33 AM EDT   To: Tenna Child, Micheline Maze, MD, *      Hi,      Rx received by [facility]   XTANDI 40MG  - COPAY IS $2,082.40.    I will look into copay assistance/ funding for pt.      Thanks   Adwoa         ----- Message -----   From: Micheline Maze, MD   Sent: 10/16/2020 12:40 AM EDT   To: Tenna Child, Manuela Schwartz, RN, *      Agree. Can the financial coordinators enter the correct specialty pharmacy into the pt's chart and delete any other specialty  pharmacies that may be there erroneously before messaging [place] about sending the rx in? Will be super helpful! Thank you!      ----- Message -----   From: Laurelyn Sickle, NP   Sent: 10/15/2020  7:11 PM EDT   To: Tenna Child, Micheline Maze, MD, *      Ok. I resent to [facility].  I just wish there was an easier way to do this! I feel like with these oral meds I am resending the prescription multiple times! Can the correct pharmacy be added to the chart once it is approved and we know where to send? This patient only had [employer] on his list so I assumed (incorrectly) that was where to send   ----- Message -----   From: Jonnie Kind, PharmD   Sent: 10/15/2020  5:06 PM EDT   To: Tenna Child, Micheline Maze, MD, *      Hi      Please send rx to [facility]      Thanks   Adwoa   ----- Message -----   From: Manuela Schwartz, RN   Sent: 10/15/2020  4:11 PM EDT   To: Tenna Child, Micheline Maze, MD, *      Melissa sent this to [facility] today because we weren't sure. So should this go to [facility]?   ----- Message -----   From: Marilynne Drivers, RN   Sent: 10/15/2020  3:38 PM EDT   To: Tenna Child, Micheline Maze, MD, *      Patient saw [facility] NP today for f/u. Still at [facility], hope for d/c 8/16. Spoke with Melissa and plan to ensure delivery of medication, book education and labs for after 8/16 and administer first doses of Xgeva/Lupron at education. Plan to transfer pt to [facility] for injections after first doses, will be seen by RN for routine labs/follow up for the first cycle or two. Left detailed message on Robin's phone (patient's daughter) to call us to schedule education and ensure delivery set up. Advised to call education dept.       Adwoa - Please feel free to reach out to Robin to discuss delivery from [facility] now that patient is ready to start. Robin's phone is listed as his secondary contact.  Thanks all,   Jess   ----- Message -----   From: Micheline Maze, MD   Sent: 10/10/2020  5:04 PM EDT   To: Tenna Child, Marilynne Drivers, RN, *      Sounds good      ----- Message -----   From: Marilynne Drivers, RN   Sent: 10/10/2020 11:01 AM EDT   To: Tenna Child, Micheline Maze, MD, *      Spoke with [name] - daughter, advised he may still be able to come to appt with Dr Kem Kays on 8/8 if stable by van/ambulance ride from rehab. She will discuss this with this nurse today when she visits him. I advised I will watch to see that he's seen the 8th and reach out after his visit or communicate if it doesn't occur.       Jess B   ----- Message -----   From: Marilynne Drivers, RN   Sent: 10/10/2020 10:56 AM EDT   To: Tenna Child, Micheline Maze, MD, *      Patient discharged to [facility] rehab as of yesterday - possibly with a PICC line for antibiotics. Will follow to discharge from [facility] and make sure f/u with oncologist occurs before educating/ starting Xtandi.       Dr Kem Kays,      Is this plan ok with you?      Thanks   Jess   ----- Message -----   From: Marilynne Drivers, RN   Sent: 10/08/2020  8:55 AM EDT   To: Tenna Child, Micheline Maze, MD, *      Patient still admitted as of today. JBRN   ----- Message -----   From: Jonnie Kind, PharmD   Sent: 10/03/2020  2:52 PM EDT   To: Tenna Child, Micheline Maze, MD, *      Got it. Will hold rx at [facility].      Also [facility] did not recieve rx.      Thanks   Adwoa   ----- Message -----   From: Marilynne Drivers, RN   Sent: 10/03/2020 11:02 AM EDT   To: Tenna Child, Micheline Maze, MD, *      Patient is admitted with possible stroke. Please withhold calls to patient/family until he is discharged. We will follow to discharge and then get him restarted on his therapy if appropriate per Dr Gunturi's orders.      Thank you for your work on this      Jess B   ----- Message -----   From: Jonnie Kind, PharmD   Sent: 10/03/2020 10:48 AM EDT   To: Tenna Child, Micheline Maze, MD, *      Hi Tara       Please send rx to [facility]. I will follow up shortly      thanks   ----- Message -----   From: Tenna Child   Sent: 10/03/2020  8:51 AM EDT   To: Micheline Maze, MD, Marilynne Drivers, RN, *      Received fax this morning that [name] was approved.   Adwoa - what pharmacy do they need to send it to?      Thanks,   Delice Bison   ----- Message -----   From: Micheline Maze, MD   Sent: 10/02/2020  1:10 PM EDT   To: Tenna Child, Marilynne Drivers, RN, *      He needs to start on all 3 things- Xtandi, Lupron and [place] He has been on nothing since  Feb. I would like the ONC navigators to do the whole process including teach and labs, etc just like a new start would be. Hope that clarifies?         ----- Message -----   From: Tenna Child   Sent: 10/02/2020  8:55 AM EDT   To: Tenna Child, Micheline Maze, MD, *      FYI- xgeva has been approved. Other PAs are still pending, will keep you posted.   ----- Message -----   From: Tenna Child   Sent: 10/01/2020  9:47 AM EDT   To: Micheline Maze, MD, Marilynne Drivers, RN, [place] Payment - please process oral pa for [name].      PA request submitted and is pending for the xgeva/lupron. I will keep you posted.      Thanks,   Delice Bison    ----- Message -----   From: Marilynne Drivers, RN   Sent: 10/01/2020  8:16 AM EDT   To: Micheline Maze, MD, Marilynne Drivers, RN, *      Please also clarify if he last had a dose of Xtandi in Feb, or you are only referring to Lupron/Xgeva dosing? Has he continued PO Xtandi?      Thank you!   Jess   ----- Message -----   From: Marilynne Drivers, RN   Sent: 10/01/2020  8:08 AM EDT   To: Micheline Maze, MD, *      Please advise if once financially cleared he needs to see education for anything or if he just needs booking to see provider for f/u for [facility] and appointments for lupron/xgeva?      Thank you!   Jess/Beth   ----- Message -----   From: Micheline Maze, MD   Sent: 09/27/2020  4:21 PM EDT   To: [facility] Clinical Support  Pool, *

## 2020-11-20 NOTE — Telephone Encounter (Signed)
 Hi Jess,      [name] is aware to get labs before taking meds. She did mention that to concerning the labs me when she confirmed delivery.      Thanks,   Adwoa      ----- Message -----   From: Marilynne Drivers, RN   Sent: 11/20/2020  2:11 PM EDT   To: Micheline Maze, MD, Johnney Ou, RN, *   Subject: RE: update please                    PAtient has still not had labs, attempted to call [name] and phone call was ignored. Called his other sister [name] who is returning to the same house as them this afternoon and stressed the importance of [name] or [name] calling me back. [name] confirmed she would give the message. [name] has been told on multiple calls that he cannot start without labs. Otilio Connors here with me during call to [facility]. She is informed of the issues that have arose in coordinating [name]'s care.      Jess B   ----- Message -----   From: Jonnie Kind, PharmD   Sent: 11/20/2020  1:23 PM EDT   To: Tenna Child, Micheline Maze, MD, *   Subject: RE: update please                    Hi,       I spoke with [name] and she was able to schedule delivery for Wednesday 11/21/20 with [facility](free drug pharmacy via manufacturer).      I informed [name] to reach out to [facility] pharmacy when pt has 7 days left of medication to coordinate delivery.      [name] has my number to reach out with any issues.      Thanks,   Adwoa      ----- Message -----   From: Marilynne Drivers, RN   Sent: 11/16/2020  1:38 PM EDT   To: Tenna Child, Micheline Maze, MD, *   Subject: RE: update please                    I just spoke with [name] & she is hesitant to set up delivery because they are so busy with appts. I stressed the importance and advised to have labs Monday and I will call tuesday to ensure she has set up delivery. Note sent to SW dept as well to alert.      Jess B   ----- Message -----   From: Jonnie Kind, PharmD   Sent: 11/16/2020 12:39 PM EDT   To: Tenna Child, Micheline Maze,  MD, *   Subject: RE: update please                    Hi Beth      I have reached out to [facility] for over a week to coordinate delivery and copay assistance that provides medication has left multiple messages for Robin.       I have received no call back from Robin and neither has manufacturer.      I left another message for [name] for delivery.      Thanks    Adwoa   ----- Message -----   From: Johnney Ou, RN   Sent: 11/16/2020 12:14 PM EDT   To: Lorel Monaco, MD, *   Subject: update please  Adwoa, do you have an update regarding medication delivery/coverage status for this patient?   Thanks,       beth   ----- Message -----   From: Marilynne Drivers, RN   Sent: 11/13/2020 11:43 AM EDT   To: Tenna Child, Micheline Maze, MD, *      Marianna Payment,      We still have no further info on Mr Dukes's assistance. Please advise as he does need to start therapy.      Thanks,   Jess B   ----- Message -----   From: Jonnie Kind, PharmD   Sent: 11/07/2020  9:36 AM EDT   To: Tenna Child, Micheline Maze, MD, *      Hi Beth      I will be reaching out to foundation this morning for a status update. I will notify you and pt with an update.      Thanks   adwoa   ----- Message -----   From: Johnney Ou, RN   Sent: 11/07/2020  9:06 AM EDT   To: Tenna Child, Micheline Maze, MD, *      Hi Adwoa!      Do we have any updates regarding copay assistance for this patient?      Please advise.    Beth   ----- Message -----   From: Marilynne Drivers, RN   Sent: 11/05/2020  2:02 PM EDT   To: Tenna Child, Micheline Maze, MD, *      Hi Adwoa,      Looking for further updates on fin. Assistance for patient.      Thanks!   Jess B   ----- Message -----   From: Jonnie Kind, PharmD   Sent: 10/30/2020 12:46 PM EDT   To: Tenna Child, Micheline Maze, MD, *      Hi       Pt application is still pending review with manufacturer and has bout 2 week turn around. We are at about day 8. I  will reach out to manufacture and pt with update today      Thanks   Adwoa   ----- Message -----   From: Marilynne Drivers, RN   Sent: 10/30/2020 11:32 AM EDT   To: Tenna Child, Micheline Maze, MD, *      Hi Adwoa,      Patient in for education today. Any updates on this coverage?      Thanks   Jess B   ----- Message -----   From: Jonnie Kind, PharmD   Sent: 10/22/2020  4:55 PM EDT   To: Tenna Child, Micheline Maze, MD, *      Hi Jess      I did get everything i needed and application was submitted to manufacture. This has about 7-14 day turn around after review      I will check on application status on Friday.       Thanks,   Adwoa   ----- Message -----   From: Marilynne Drivers, RN   Sent: 10/22/2020  3:14 PM EDT   To: Tenna Child, Micheline Maze, MD, *      Marianna Payment,      Did you get everything you need for this copay assistance and is delivery set? Patient will be educated this week.      Thanks!      Jess B   ----- Message -----   From: Laurelyn Sickle, NP   Sent: 10/17/2020 10:18 PM EDT   To: Delice Bison  Lattie Haw, MD, *      Great! Thanks :)   ----- Message -----   From: Marilynne Drivers, RN   Sent: 10/17/2020  3:27 PM EDT   To: Tenna Child, Micheline Maze, MD, *      Spoke with [name] (daughter). She will bring patient to be educated 8/18 and plans to call Adwoa back with SSN for dad today to process copay assistance.      Jess B   ----- Message -----   From: Jonnie Kind, PharmD   Sent: 10/16/2020 10:33 AM EDT   To: Tenna Child, Micheline Maze, MD, *      Hi,      Rx received by [facility]   XTANDI 40MG  - COPAY IS $2,082.40.    I will look into copay assistance/ funding for pt.      Thanks   Adwoa         ----- Message -----   From: Micheline Maze, MD   Sent: 10/16/2020 12:40 AM EDT   To: Tenna Child, Manuela Schwartz, RN, *      Agree. Can the financial coordinators enter the correct specialty pharmacy into the pt's chart and delete any other specialty  pharmacies that may be there erroneously before messaging [place] about sending the rx in? Will be super helpful! Thank you!      ----- Message -----   From: Laurelyn Sickle, NP   Sent: 10/15/2020  7:11 PM EDT   To: Tenna Child, Micheline Maze, MD, *      Ok. I resent to [facility].  I just wish there was an easier way to do this! I feel like with these oral meds I am resending the prescription multiple times! Can the correct pharmacy be added to the chart once it is approved and we know where to send? This patient only had [employer] on his list so I assumed (incorrectly) that was where to send   ----- Message -----   From: Jonnie Kind, PharmD   Sent: 10/15/2020  5:06 PM EDT   To: Tenna Child, Micheline Maze, MD, *      Hi      Please send rx to [facility]      Thanks   Adwoa   ----- Message -----   From: Manuela Schwartz, RN   Sent: 10/15/2020  4:11 PM EDT   To: Tenna Child, Micheline Maze, MD, *      Melissa sent this to [facility] today because we weren't sure. So should this go to [facility]?   ----- Message -----   From: Marilynne Drivers, RN   Sent: 10/15/2020  3:38 PM EDT   To: Tenna Child, Micheline Maze, MD, *      Patient saw [facility] NP today for f/u. Still at [facility], hope for d/c 8/16. Spoke with Melissa and plan to ensure delivery of medication, book education and labs for after 8/16 and administer first doses of Xgeva/Lupron at education. Plan to transfer pt to [facility] for injections after first doses, will be seen by RN for routine labs/follow up for the first cycle or two. Left detailed message on Robin's phone (patient's daughter) to call us to schedule education and ensure delivery set up. Advised to call education dept.       Adwoa - Please feel free to reach out to Robin to discuss delivery from [facility] now that patient is ready to start. Robin's phone is listed as his secondary contact.  Thanks all,   Jess   ----- Message -----   From: Micheline Maze, MD   Sent: 10/10/2020  5:04 PM EDT   To: Tenna Child, Marilynne Drivers, RN, *      Sounds good      ----- Message -----   From: Marilynne Drivers, RN   Sent: 10/10/2020 11:01 AM EDT   To: Tenna Child, Micheline Maze, MD, *      Spoke with [name] - daughter, advised he may still be able to come to appt with Dr Kem Kays on 8/8 if stable by van/ambulance ride from rehab. She will discuss this with this nurse today when she visits him. I advised I will watch to see that he's seen the 8th and reach out after his visit or communicate if it doesn't occur.       Jess B   ----- Message -----   From: Marilynne Drivers, RN   Sent: 10/10/2020 10:56 AM EDT   To: Tenna Child, Micheline Maze, MD, *      Patient discharged to [facility] rehab as of yesterday - possibly with a PICC line for antibiotics. Will follow to discharge from [facility] and make sure f/u with oncologist occurs before educating/ starting Xtandi.       Dr Kem Kays,      Is this plan ok with you?      Thanks   Jess   ----- Message -----   From: Marilynne Drivers, RN   Sent: 10/08/2020  8:55 AM EDT   To: Tenna Child, Micheline Maze, MD, *      Patient still admitted as of today. JBRN   ----- Message -----   From: Jonnie Kind, PharmD   Sent: 10/03/2020  2:52 PM EDT   To: Tenna Child, Micheline Maze, MD, *      Got it. Will hold rx at [facility].      Also [facility] did not recieve rx.      Thanks   Adwoa   ----- Message -----   From: Marilynne Drivers, RN   Sent: 10/03/2020 11:02 AM EDT   To: Tenna Child, Micheline Maze, MD, *      Patient is admitted with possible stroke. Please withhold calls to patient/family until he is discharged. We will follow to discharge and then get him restarted on his therapy if appropriate per Dr Gunturi's orders.      Thank you for your work on this      Jess B   ----- Message -----   From: Jonnie Kind, PharmD   Sent: 10/03/2020 10:48 AM EDT   To: Tenna Child, Micheline Maze, MD, *      Hi Tara       Please send rx to [facility]. I will follow up shortly      thanks   ----- Message -----   From: Tenna Child   Sent: 10/03/2020  8:51 AM EDT   To: Micheline Maze, MD, Marilynne Drivers, RN, *      Received fax this morning that [name] was approved.   Adwoa - what pharmacy do they need to send it to?      Thanks,   Delice Bison   ----- Message -----   From: Micheline Maze, MD   Sent: 10/02/2020  1:10 PM EDT   To: Tenna Child, Marilynne Drivers, RN, *      He needs to start on all 3 things- Xtandi, Lupron and [place] He has been on nothing since  Feb. I would like the ONC navigators to do the whole process including teach and labs, etc just like a new start would be. Hope that clarifies?         ----- Message -----   From: Tenna Child   Sent: 10/02/2020  8:55 AM EDT   To: Tenna Child, Micheline Maze, MD, *      FYI- xgeva has been approved. Other PAs are still pending, will keep you posted.   ----- Message -----   From: Tenna Child   Sent: 10/01/2020  9:47 AM EDT   To: Micheline Maze, MD, Marilynne Drivers, RN, Marianna Payment - please process oral pa for [name].      PA request submitted and is pending for the xgeva/lupron. I will keep you posted.      Thanks,   Delice Bison    ----- Message -----   From: Marilynne Drivers, RN   Sent: 10/01/2020  8:16 AM EDT   To: Micheline Maze, MD, Marilynne Drivers, RN, *      Please also clarify if he last had a dose of Xtandi in Feb, or you are only referring to Lupron/Xgeva dosing? Has he continued PO Xtandi?      Thank you!   Jess   ----- Message -----   From: Marilynne Drivers, RN   Sent: 10/01/2020  8:08 AM EDT   To: Micheline Maze, MD, *      Please advise if once financially cleared he needs to see education for anything or if he just needs booking to see provider for f/u for [facility] and appointments for lupron/xgeva?      Thank you!   Jess/Beth   ----- Message -----   From: Micheline Maze, MD   Sent: 09/27/2020  4:21 PM EDT   To: [facility]  Pool, *

## 2020-11-21 ENCOUNTER — Other Ambulatory Visit

## 2020-11-21 ENCOUNTER — Encounter: Payer: MEDICARE | Primary: Internal Medicine

## 2020-11-21 ENCOUNTER — Encounter (HOSPITAL_BASED_OUTPATIENT_CLINIC_OR_DEPARTMENT_OTHER)

## 2020-11-21 ENCOUNTER — Other Ambulatory Visit (HOSPITAL_BASED_OUTPATIENT_CLINIC_OR_DEPARTMENT_OTHER)

## 2020-11-21 ENCOUNTER — Encounter: Admit: 2020-11-21 | Discharge: 2020-11-21 | Payer: MEDICARE | Primary: Internal Medicine

## 2020-11-21 DIAGNOSIS — C7951 Secondary malignant neoplasm of bone: Secondary | ICD-10-CM | POA: Diagnosis not present

## 2020-11-21 DIAGNOSIS — S32000D Wedge compression fracture of unspecified lumbar vertebra, subsequent encounter for fracture with routine healing: Secondary | ICD-10-CM | POA: Diagnosis not present

## 2020-11-21 DIAGNOSIS — I130 Hypertensive heart and chronic kidney disease with heart failure and stage 1 through stage 4 chronic kidney disease, or unspecified chronic kidney disease: Secondary | ICD-10-CM | POA: Diagnosis not present

## 2020-11-21 DIAGNOSIS — I69322 Dysarthria following cerebral infarction: Secondary | ICD-10-CM | POA: Diagnosis not present

## 2020-11-21 DIAGNOSIS — T451X5D Adverse effect of antineoplastic and immunosuppressive drugs, subsequent encounter: Secondary | ICD-10-CM | POA: Diagnosis not present

## 2020-11-21 DIAGNOSIS — C61 Malignant neoplasm of prostate: Secondary | ICD-10-CM | POA: Diagnosis not present

## 2020-11-21 DIAGNOSIS — W109XXD Fall (on) (from) unspecified stairs and steps, subsequent encounter: Secondary | ICD-10-CM | POA: Diagnosis not present

## 2020-11-21 DIAGNOSIS — G620 Drug-induced polyneuropathy: Secondary | ICD-10-CM | POA: Diagnosis not present

## 2020-11-21 DIAGNOSIS — I69393 Ataxia following cerebral infarction: Secondary | ICD-10-CM | POA: Diagnosis not present

## 2020-11-21 NOTE — Progress Notes (Signed)
 Phone call from [name], patient's sister after leaving office last evening, message found this morning, attempted call back and LMOM that [name] needs labs, the verbal ok to start once labs result, and to book RN f/u. Awaiting call back from Robin. Ensured lab orders are in the chart for patient. JBRN

## 2020-11-21 NOTE — Progress Notes (Signed)
 Second attempt today to call [name], patient's daughter to ask if med was delivered and advise lab orders are pending & he needs RN f/u booked now that he has his medication. Will attempt to reach back again next week as I am out of office until Monday. JBRN

## 2020-11-21 NOTE — Home Health (Signed)
 RN routine visit for cardiopulmonary assessment. At home with daughter and son, looking tired and states that he did not sleep well. LSCTA bilaterally and no signs/symptoms of cardiac distress. Blood pressure is now well controlled with reading in the 120s systolically. Continues to have speech issues-only able to speak a few words. No GI dyspepsia and reports no issues with bowel movement. Patient refused PT initial evaluation stating that he does not need it. Educated patient on the importance of moderate exercise in the healing process. Medications reviewed, patient is current with all scheduled medications. No other issues voiced.

## 2020-11-22 ENCOUNTER — Other Ambulatory Visit

## 2020-11-22 ENCOUNTER — Ambulatory Visit: Payer: MEDICARE | Attending: Nurse Practitioner | Primary: Internal Medicine

## 2020-11-22 ENCOUNTER — Encounter: Admit: 2020-11-22 | Discharge: 2020-11-22 | Payer: MEDICARE | Primary: Internal Medicine

## 2020-11-22 DIAGNOSIS — I69322 Dysarthria following cerebral infarction: Secondary | ICD-10-CM | POA: Diagnosis not present

## 2020-11-22 DIAGNOSIS — W109XXD Fall (on) (from) unspecified stairs and steps, subsequent encounter: Secondary | ICD-10-CM | POA: Diagnosis not present

## 2020-11-22 DIAGNOSIS — G620 Drug-induced polyneuropathy: Secondary | ICD-10-CM | POA: Diagnosis not present

## 2020-11-22 DIAGNOSIS — I69393 Ataxia following cerebral infarction: Secondary | ICD-10-CM | POA: Diagnosis not present

## 2020-11-22 DIAGNOSIS — S32000D Wedge compression fracture of unspecified lumbar vertebra, subsequent encounter for fracture with routine healing: Secondary | ICD-10-CM | POA: Diagnosis not present

## 2020-11-22 DIAGNOSIS — T451X5D Adverse effect of antineoplastic and immunosuppressive drugs, subsequent encounter: Secondary | ICD-10-CM | POA: Diagnosis not present

## 2020-11-22 DIAGNOSIS — I130 Hypertensive heart and chronic kidney disease with heart failure and stage 1 through stage 4 chronic kidney disease, or unspecified chronic kidney disease: Secondary | ICD-10-CM | POA: Diagnosis not present

## 2020-11-22 DIAGNOSIS — C7951 Secondary malignant neoplasm of bone: Secondary | ICD-10-CM | POA: Diagnosis not present

## 2020-11-22 DIAGNOSIS — C61 Malignant neoplasm of prostate: Secondary | ICD-10-CM | POA: Diagnosis not present

## 2020-11-22 NOTE — Home Health (Signed)
 pt seen today with daughter present, daughter reporting pt frustrated with ongoing visits "why are they still coming i dont need this anymore". pt demos understanding of compensatory strategies with fair carryover. pt struggles when frustrated, benefits from cues to slow down.  reports increased difficulty on phone but also reporting this is when speaking to telemarketers "who dont understand english".  encouraged pt to focus on conversations with important partners and advocate for more time to speak when necessary.  pt family continues to manage medications (daughter reports pt continues to complain "why do i need all this" but is compliant with assistance).  pt discharged from speech therapy at this time.

## 2020-11-23 ENCOUNTER — Other Ambulatory Visit

## 2020-11-25 ENCOUNTER — Other Ambulatory Visit

## 2020-11-26 ENCOUNTER — Encounter (HOSPITAL_BASED_OUTPATIENT_CLINIC_OR_DEPARTMENT_OTHER)

## 2020-11-26 ENCOUNTER — Other Ambulatory Visit

## 2020-11-26 NOTE — Progress Notes (Signed)
 Last phone call attempt to patient's sister [name] to inform her that we still do not have labs resulted for patient to start his PO Enzalutamide and he cannot start until these are done and needs 1 week f/u with RN once he starts his medication which she has not called to schedule either. I advised that both of these pieces of information have been left in multiple voicemails for her and that we will be sending a certified letter to [name]'s home address and if he has chosen not to take the medicaiton, we need to hear that directly from him. Infusion room phone number left for call back. I have attached [name] from SW here as she is aware of patient's case and has been working with me on this attempt to reach [name] - patient's sister.    [facility] clerical team,    Please send certified letter to patients home address stating:    We have tried multiple times to reach you through your sister's phone number regarding lab work that needs to be completed before you start Enzalutamide as well as attempt to schedule a follow up with a nurse once it is started. Please contact us regarding these issues. We requested a phone number for you, but we were told you do not have a phone.    Thank you

## 2020-11-26 NOTE — Progress Notes (Signed)
 [facility] clerical team:    Can you confirm if you have sent a certified letter to patient's home that was requested? Irving Burton in SW is following this case due to non compliance of caregiver.    Thank you,  Jess B

## 2020-11-27 ENCOUNTER — Other Ambulatory Visit

## 2020-11-27 NOTE — Oncology Clinic Note (Addendum)
Patient:    [name], [name]            MRN: [id number]            FIN: [id number]               Age:   86 years     Sex:  Male     DOB:  01/04/1932   Associated Diagnoses:   None   Author:   Taleen Prosser MD, Shiloh Swopes      Chief Complaint: prostate cancer    Oncologic/hematologic History:  He was recently admitted to the hospital s/p fall down the stairs, resulting in a back injury.  Extensive work-up including CT head, CT spine and MRI spine was done.  It revealed evidence of metastatic disease to the back and a mild compression fracture of L2.  There was no evidence of spinal cord compression.  PSA was checked and it returned high at 245.  He was treated conservatively for his fracture and was started on pain meds.  He was then started on Lupron and Casodex, as well as Xgeva. Casodex was then discontinued and he remained on the other two. For his bony met in the back, he underwent kyphoplasty. Repeat PSA on 10/29/17 was 0.1.      Interval History:   Since his last visit here, he has been feeling well. He is tolerating treatment pretty well. He does endorse hot flashes but states they are manageable. Back pain is improved after kyphoplasty.    Of note, he will be going to [place] for the winter. His PCP is Dr. McKeown there.      ROS:  Constitutional: Negative  HEENT: Negative  Respiratory: Negative  Cardiovascular: Negative  Gastrointestinal: Negative  Genitourinary: Negative  Heme/Lymph: Negative  Endocrine: hot flashes  Immunologic: Negative  Musculoskeletal: back pain  Integumentary: Negative  Neurologic: Negative  Psychiatric: Negative  All other ROS: Negative       Allergies:  Allergies (1) Active Reaction  NKA None Documented  ALLERGIES         Home Meds:   Medication List     Active Medications         Documented             acetaminophen: 650 mg, 2 tab(s), PO, q4hr, PRN: Fever/Pain, Mild to               Moderate, 0 Refill(s).             Al hydroxide/Mg hydroxide/simethicone: 30 mL, PO, q4hr, PRN:                Dyspepsia, 0 Refill(s).             ALPRAZolam: See Instructions, 0.25 mg PO TID PRN, PRN: as needed for               anxiety, 0 Refill(s).             aspirin: 81 mg, Chewed, Daily, 0 Refill(s).             calcium carbonate: 600 mg, 1 tab(s), PO, Daily, 0 Refill(s).             cholecalciferol: 1,000 Int_Unit, PO, Daily, 0 Refill(s).             codeine-guaifenesin: 10 mL, PO, q4hr, 10ml twice a day as needed, 0               Refill(s).               docusate: 100 mg, 1 cap(s), PO, BID, PRN: Constipation, 0 Refill(s).             fentaNYL: 25 mcg, q72hr, change q 3 days fentayl 25 mgPartial fill               upon request, 0 Refill(s).             gabapentin: 300 mg, 1 cap(s), PO, BID, 0 Refill(s).             lisinopril: 5 mg, 1 tab(s), PO, Daily, 0 Refill(s).             loperamide: 2 mg, 1 tab(s), PO, q6hr, as needed, 0 Refill(s).             omeprazole: 20 mg, PO, Daily, 0 Refill(s).             ondansetron: See Instructions, 1 tab(s) PO q8hr PRN, 0 Refill(s).             oxyCODONE: 5 mg, PO, as needed, 0 Refill(s).             polyethylene glycol 3350: 17 Gm, PO, Daily, PRN: Constipation, 0               Refill(s).     Medications Inactivated in the Last 72 Hours         bicalutamide: 50 mg, 1 tab(s), PO, q24hr, 0 Refill(s).         metoprolol: 25 mg, 1 cap(s), PO, Daily, 0 Refill(s).        Physical Exam:  Vital signs:  Temperature 96.9  (13:13)  Systolic Blood Pressure 153  (13:13)  Diastolic Blood Pressure 85  (13:13)  Pulse 80  (13:13)  SpO2 97  (13:13)  Respiratory Rate 18  (13:13)    ECOG: 1-2  General: well-appearing elderly male, not in acute distress, in a wheelchair  Head and neck: normocephalic, no masses  Eyes, Ear and Throat: moist mucous membranes, extraocular muscles intact, anicteric sclera  Cardiovascular: regular rate and rhythm, no murmurs, gallops or rubs  Respiratory: clear to auscultation bilaterally, no wheezes or crackles  Abdomen: soft, nontender, nondistended, no  organomegaly  Extremities: warm, perfused, no edema        Labs:  10/29/17  PSA 0.1          Impression and Plan: This is an 86-year-old male with a new diagnosis of stage IV prostate cancer, 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     Imaging thus far as well as the high PSA which indicates the diagnosis of stage IV prostate cancer.  He was recommended to have staging scans, CT chest/abdomen/pelvis and bone scan but he canceled these secondary to the cost.  He was started on bicalutamide and then received his first dose of Lupron as few days later.  He  also started Xgeva for his bony mets. It is a monthly dose. He tolerated both these injections well. He does have hot flashes from the Lupron. He will continue. Encouraged him to take Ca/Vit D suppl daily. Will check bone density next year.     The bicalutamide was discontinued and patient will be maintained on Lupron alone until progression.      For his pain, he will continue on fentanyl patch and oxycodone as needed.  He was referred to [facility] to see if there was a role for palliative XRT. He ultimately went for kyphoplasty to his spine.     Recent   PSA showed an excellent response to treatment, currently 0.1. Will continue to trend every 3 months.     I will send records to Dr. McKeown in [place] so patient can be set up to continue treatment down there during the winter months.      Patient is agreeable to plan.  He will return for follow-up in April when he is back in town.   He knows to call with questions/concerns anytime.                      Reviewed and electronically verified by:  Jaysa Kise MD, Iann Rodier                                                                     on:  11/10/2017 15:59Reviewed and electronically authenticated by:  Clarence Dunsmore                                                                       on:  11/10/17 15:59

## 2020-11-27 NOTE — Oncology Clinic Note (Addendum)
Patient:   [name], [name]            MRN: [id number]            FIN: [id number]               Age:   87 years     Sex:  Male     DOB:  [birth year]   Associated Diagnoses:   None   Author:   Lorraine Lax NP, [place] Citizen      Visit Information   Patient is seen under the supervision of Dr. Kem Kays.      Chief Complaint   metastatic prostate cancer    History of Presenting Illness:  This is a 87 year old male with history of a flutter, GERD, hyperlipidemia and hypertension now here for consultation regarding his new diagnosis of metastatic prostate cancer.  He is accompanied by his 2 daughters today     Briefly, he was recently admitted to the hospital s/p fall down the stairs, resulting in a back injury.  Extensive work-up including CT head, CT spine and MRI spine was done.  It revealed evidence of metastatic disease to the back and a mild compression fracture of L2.  There was no evidence of spinal cord compression.  PSA was checked and it returned high at 245.  He was treated conservatively for his fracture and was started on pain meds.  He was discharged to rehab where he currently resides.    Of note, he was given a prescription for bicalutamide at discharge. He started taking it today. He has an appt with his urologist Dr. [name] next week, at which time he is scheduled to receive a Lupron shot.         Interval History   Since his last visit here the patient has had his first injection of Lupron. He states he has had some night sweats but is feeling overall better. He did have the kyphoplasty to his back and is using oxycodone with good relief. He denies any new pains. He also states he started on the Xgeva injections as well. Denies fevers, chills, headache, shortness of breath.     Of note: he wishes to return home to [facility] ([place]).      Review of Systems   Constitutional:  Fatigue, Decreased activity.    Eye:  Negative.    Ear/Nose/Mouth/Throat:  Negative.    Respiratory:  Negative.    Cardiovascular:  Negative.     Gastrointestinal:  Negative.    Genitourinary:  Negative.    Hematology/Lymphatics:  Negative.    Endocrine:  Negative.    Immunologic:  Negative.    Musculoskeletal:  Negative.    Integumentary:  Negative.    Neurologic:  Alert and oriented X4.    Psychiatric:  Negative.    All other systems are negative      Health Status   Allergies:    Allergic Reactions (Selected)  NKA,    Allergies (1) Active Reaction  NKA None Documented     Problem list:    All Problems  Anxiety / 7253664403 / Confirmed  Atrial flutter / 4742595 / Confirmed  Back pain / 638756433 / Confirmed  SOB - Shortness of breath / 295188416 / Confirmed  Fracture of lumbar vertebrae / 606301601 / Confirmed  GERD - Gastro-esophageal reflux disease / 0932355732 / Confirmed  Hematoma / 2025427062 / Confirmed  Hyperlipidemia / 37628315 / Confirmed  HTN / 1761607371 / Confirmed  Arthritis / 0626948546 / Confirmed  Prostate  cancer / 1610960454 / Confirmed  Metastasis of malignant neoplasm to bone / 098119147 / Confirmed,    Active Problems (12)  Anxiety   Arthritis   Atrial flutter   Back pain   Fracture of lumbar vertebrae   GERD - Gastro-esophageal reflux disease   Hematoma   HTN   Hyperlipidemia   Metastasis of malignant neoplasm to bone   Prostate cancer   SOB - Shortness of breath         Histories   Past Medical History:    No active or resolved past medical history items have been selected or recorded.   Family History:    High blood pressure  Daughter  Daughter  Kidney transplant  Daughter     Procedure history:    repair wrist laceration.   Social History        Social & Psychosocial Habits    Home/Environment  08/14/2017  Lives with: Children    Comment: lives with daughter and brother has 7 children - 08/14/2017 11:49 - Margaretha Glassing RN, [name]    Nutrition/Health  08/14/2017  Type of diet: Regular    Substance Abuse  10/19/2015  Use: Never    Tobacco  10/19/2015  Use: Former smoker    Alcohol  10/19/2015  Use: None    Employment/School  08/14/2017   Status: Retired  .        Physical Examination   Vital Signs   09/15/2017 15:19  Temperature Oral 97.4 DegF    Peripheral Pulse Rate 52 bpm    Respiratory Rate 20 br/min    Blood Pressure Extremity Right arm    Systolic Blood Pressure 161 mmHg  HI    Diastolic Blood Pressure 78 mmHg    Mean Arterial Pressure 106 mmHg    SpO2 97 %    Oxygen Therapy Room air      Measurements from flowsheet : Measurements   09/15/2017 15:19  Height 170 cm    Type of Height Stated height    Weight 75 Kg    Type of Weight Stated weight    BSA 1.86 m2    Body Mass Index 25.95 Kg/m2      General:  Alert and oriented, No acute distress.    Eye:  Normal conjunctiva.    HENT:  Normocephalic, Oral mucosa is moist, No pharyngeal erythema.    Neck:  Supple, Non-tender, No lymphadenopathy.    Respiratory:  Lungs are clear to auscultation, Respirations are non-labored, Breath sounds are equal, Symmetrical chest wall expansion, No chest wall tenderness.    Cardiovascular:  Normal rate, Regular rhythm, No murmur, No gallop, Good pulses equal in all extremities, Normal peripheral perfusion, No edema.    Integumentary:  Warm, Dry, Intact.    Neurologic:  Alert, Oriented.    Cognition and Speech:  Oriented, Speech clear and coherent.    Psychiatric:  Cooperative, Appropriate mood & affect.       Review / Management   Results review:  Lab results   09/09/2017 08:18  PT 10.9 sec    INR 1.1  NA    aPTT 26 sec   09/09/2017 07:02  Glucose Lvl 78 mg/dL    BUN 17 mg/dL    Creatinine 8.295 mg/dL  HI    Afn Amer Glomerular Filtration Rate 52 ml/min/1.30m2  NA    Non-Afn Amer Glomerular Filtration Rate 45 ml/min/1.21m2  NA    Sodium Lvl 143 mmol/L    Potassium Lvl 4.9 mmol/L  Chloride 113 mmol/L  HI    CO2 24 mmol/L    Anion Gap 6    Calcium Lvl 7.3 mg/dL  LOW    WBC 5.8 thous/mm3    RBC 3.86 Mil/mm3  LOW    Hgb 12.4 Gm/dL  LOW    Hct 16.1 %  LOW    Platelet 155 thous/mm3    MCV 99.0 fL    MCH 32.1 pGm    MCHC 32.5 Gm/dL    RDW-SD 09.6 fL    MPV 10.5 fL     Absolute Neutro Count 2.76 thous/mm3    Absolute Lymphs Count 1.74 thous/mm3    Absolute Mono Count 0.63 thous/mm3    Absolute Eos Count 0.59 thous/mm3  HI    Absolute Baso Count 0.04 thous/mm3    Neutrophils 47.8 %  NA    Lymphocytes 30.2 %  NA    Monocytes 10.9 %  NA    Eosinophils 10.2 %  NA    Basophils 0.7 %  NA    Immature Granulocytes 0.2 %    NRBC Percent 0.0 %    Absolute NRBC Count 0.00 thous/mm3  NA       Impression and Plan   This is an 87 year old male with a new diagnosis of stage IV prostate cancer, 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     Imaging thus far as well as the high PSA which indicates the diagnosis of stage IV prostate cancer.  He was recommended to have staging scans, CT chest/abdomen/pelvis and bone scan but he canceled these secondary to the cost.  He was started on bicalutamide and then received his first dose of Lupron as few days later.  He  also started [facility] for his bony mets. It is a monthly dose. He tolerated both these injections well. He does have hot flashes from the Lupron. He will continue.      The bicalutamide can be discontinued and patient will be maintained on Lupron alone until progression.      For his pain, he will continue on fentanyl patch and oxycodone as needed.  He was referred to RadOnc to see if there was a role for palliative XRT. He ultimately went for  kyphoplasty to his spine.     I will trend his PSA monthly.      Patient is agreeable to plan.  He will return for follow-up in 8 weeks.   He knows to call with questions/concerns anytime.                  Reviewed and electronically verified by:  Lorraine Lax [place] Citizen                                                                       on:  09/16/2017 14:38    Reviewed and electronically authenticated by:  Rosita Fire  on:  09/16/17 14:38

## 2020-11-27 NOTE — Oncology Clinic Note (Addendum)
Patient:    [name], [name]            MRN: [id number]            FIN: [id number]               Age:   86 years     Sex:  Male     DOB:  09/21/1931   Associated Diagnoses:   None   Author:   Chryl Holten MD, Yumna Ebers      CC: Dr. Elias Nabbout    Chief Complaint: metastatic prostate cancer    History of Presenting Illness:  This is a 86-year-old male with history of a flutter, GERD, hyperlipidemia and hypertension now here for consultation regarding his new diagnosis of metastatic prostate cancer.  He is accompanied by his 2 daughters today     Briefly, he was recently admitted to the hospital s/p fall down the stairs, resulting in a back injury.  Extensive work-up including CT head, CT spine and MRI spine was done.  It revealed evidence of metastatic disease to the back and a mild compression fracture of L2.  There was no evidence of spinal cord compression.  PSA was checked and it returned high at 245.  He was treated conservatively for his fracture and was started on pain meds.  He was discharged to rehab where he currently resides.     Currently patient is feeling okay.  He states that his back pain is at level 8 out of 10 most of the time, in spite of being on a fentanyl patch.  He has oxycodone available for breakthrough pain but he is not in the habit of asking for it much.  Denies any neurological symptoms.      Of note, he was given a prescription for bicalutamide at discharge. He started taking it today. He has an appt with his urologist Dr. Altman next week, at which time he is scheduled to receive a Lupron shot.            ROS:  Constitutional: fatigue  Eye: Negative  ENMT: Negative  Respiratory: Negative  Cardiovascular: Negative  Gastrointestinal: Negative  Genitourinary: Negative  Heme/Lymph: Negative  Endocrine: Negative  Immunologic: Negative  Musculoskeletal: back pain  Integumentary: Negative  Neurologic: Negative  Psychiatric: Negative  All other ROS: Negative     Past Medical  History:  Anxiety  Arthritis  Atrial flutter  Back pain  GERD - Gastro-esophageal reflux disease  HTN  Hyperlipidemia  Prostate cancer  SOB - Shortness of breath      Social History:  Alcohol  Details: None  Employment/School  Details: Retired  Home/Environment  Details: Lives with Children.; Comment(s): lives with daughter and brother has 7 children  Nutrition/Health  Details: Regular  Substance Abuse  Details: Never  Tobacco  Details: Former smoker      Family History:  Daughter: High blood pressure; Kidney transplant  Daughter: High blood pressure        Allergies:  Allergies (1) Active Reaction  NKA None Documented  ALLERGIES         Home Meds:   Medication List     Active Medications         Documented             acetaminophen: 650 mg, 2 tab(s), PO, q4hr, PRN: Fever/Pain, Mild to               Moderate, 0 Refill(s).               Al hydroxide/Mg hydroxide/simethicone: 30 mL, PO, q4hr, PRN:               Dyspepsia, 0 Refill(s).             ALPRAZolam: PRN: as needed for anxiety, 0 Refill(s).             aspirin: 81 mg, Chewed, Daily, 0 Refill(s).             bicalutamide: 50 mg, 1 tab(s), PO, q24hr, 0 Refill(s).             codeine-guaifenesin: PO, q4hr, 10ml twice a day as needed, 0               Refill(s).             docusate: 100 mg, 1 cap(s), PO, BID, PRN: Constipation, 0 Refill(s).             fentaNYL: 25 mcg, q72hr, change q 3 days fentayl 25 mgPartial fill               upon request, 0 Refill(s).             gabapentin: 300 mg, 1 cap(s), PO, TID, 0 Refill(s).             lisinopril: 5 mg, 1 tab(s), PO, Daily, 0 Refill(s).             loperamide: 2 mg, 1 tab(s), PO, q6hr, as needed, 0 Refill(s).             metoprolol: 25 mg, 1 cap(s), PO, Daily, hold BP less than 100mg, 0               Refill(s).             omeprazole: 20 mg, PO, Daily, 0 Refill(s).             ondansetron: 4 mg, 1 tab(s), PO, q8hr, as needed, 0 Refill(s).             oxyCODONE: 5 mg, 1 tab(s), PO, q6hr, Partial fill upon request, 0                Refill(s).             polyethylene glycol 3350: 17 Gm, PO, Daily, PRN: Constipation, 0               Refill(s).     Medications Inactivated in the Last 72 Hours         fentaNYL: 12 mcg/hr, 1 patch(es), Transderm, q72hr, Partial fill upon           request, 3 patch(es), 0 Refill(s).         metoprolol: 0 Refill(s).         omeprazole: 20 mg, PO, Daily, 0 Refill(s).        Physical Exam:  Vital signs:  per RN assessment  ECOG: 1-2  General: elderly male appearing weak, not in acute distress  Head and neck: normocephalic, no masses  Eyes, Ear and Throat: moist mucous membranes, extraocular muscles intact, anicteric sclera  Cardiovascular: regular rate and rhythm, no murmurs, gallops or rubs  Respiratory: clear to auscultation bilaterally, no wheezes or crackles  Abdomen: soft, nontender, nondistended, no organomegaly  Extremities: warm, perfused, no edema  Back: with a brace      Labs:  07/21/17  PSA 245        Imaging:  07/20/17 MRI L spine  IMPRESSION:   Multilevel bony metastatic   disease which is most prominent in the sacrum and L1  and L5 vertebral bodies.  Acute mild compression fracture in the L2 superior endplate. A pathologic  fracture cannot be excluded.  Suspected small hematoma in the L1-2 and L1 right posterolateral epidural space  causing moderate central spinal stenosis.  Central spinal stenosis secondary to degenerative change which is moderate at  L2-3 and L4-5, mild to moderate at L5-S1, and mild at L3-4.        Impression and Plan:  This is a 86-year-old male with a new diagnosis of stage IV prostate cancer, 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     I reviewed his imaging thus far as well as the high PSA which indicates the diagnosis of stage IV prostate cancer.  I recommend staging scans, CT chest/abdomen/pelvis and bone scan.  I agree with starting bicalutamide now. After a few days, he will receive his first dose of Lupron.  I recommend also starting Xgeva for his bony mets. It is a monthly dose, and  I will order that to start next week. Eventually, bicalutamide can be discontinued and patient will be maintained on Lupron alone until progression. Will discuss more at the next visit.     For his pain, he will continue on fentanyl patch and oxycodone as needed.  I encouraged him to not hesitate to use the oxycodone as needed for his breakthrough pain.  I will also refer him to RadOnc to see if there is a role for palliative XRT.     Patient is agreeable to plan.  He will return for follow-up in 1 month.  He knows to call with questions/concerns anytime.                    Reviewed and electronically verified by:  Pasqualina Colasurdo MD, Paulina Muchmore                                                                     on:  08/17/2017 17:43Reviewed and electronically authenticated by:  Harnoor Kohles                                                                       on:  08/17/17 17:43

## 2020-11-28 ENCOUNTER — Other Ambulatory Visit: Admitting: Rehabilitative and Restorative Service Providers"

## 2020-11-28 ENCOUNTER — Other Ambulatory Visit

## 2020-11-28 ENCOUNTER — Encounter

## 2020-11-28 ENCOUNTER — Encounter: Admit: 2020-11-28 | Discharge: 2020-11-28 | Payer: MEDICARE | Primary: Family Medicine

## 2020-11-28 DIAGNOSIS — S32000D Wedge compression fracture of unspecified lumbar vertebra, subsequent encounter for fracture with routine healing: Secondary | ICD-10-CM | POA: Diagnosis not present

## 2020-11-28 DIAGNOSIS — I69322 Dysarthria following cerebral infarction: Secondary | ICD-10-CM | POA: Diagnosis not present

## 2020-11-28 DIAGNOSIS — W109XXD Fall (on) (from) unspecified stairs and steps, subsequent encounter: Secondary | ICD-10-CM | POA: Diagnosis not present

## 2020-11-28 DIAGNOSIS — I69393 Ataxia following cerebral infarction: Secondary | ICD-10-CM | POA: Diagnosis not present

## 2020-11-28 DIAGNOSIS — I130 Hypertensive heart and chronic kidney disease with heart failure and stage 1 through stage 4 chronic kidney disease, or unspecified chronic kidney disease: Secondary | ICD-10-CM | POA: Diagnosis not present

## 2020-11-28 DIAGNOSIS — C61 Malignant neoplasm of prostate: Secondary | ICD-10-CM | POA: Diagnosis not present

## 2020-11-28 DIAGNOSIS — T451X5D Adverse effect of antineoplastic and immunosuppressive drugs, subsequent encounter: Secondary | ICD-10-CM | POA: Diagnosis not present

## 2020-11-28 DIAGNOSIS — G620 Drug-induced polyneuropathy: Secondary | ICD-10-CM | POA: Diagnosis not present

## 2020-11-28 DIAGNOSIS — C7951 Secondary malignant neoplasm of bone: Secondary | ICD-10-CM | POA: Diagnosis not present

## 2020-11-28 NOTE — Home Health (Signed)
 [facility] discharge for 87 y.o male who was last seen at the ED for dysarthria lasting for 4 days and unsteady gait. 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 Treatment involved high density statins with aspirin, Plavix was added to the treatment regimen later and was also followed by neurology.  During hospitalization, patient developed PNA necessitating treatment with Vancomycin and Zosyn for 5 days. [facility]. Patient also has metastatic prostate cancer and is on Lupron but has of recent been missing scheduled due to refusal to go for lab work. Other pertinent PMH: Atrial flutter status post cardioversion, CHF, HTN, CKD (baseline creatinine 1.5). Patient was discharged from rehab with home health care services which included skilled nursing, OT, PT, and speech therapy.  Unfortunately, patient refused assessment and intake for PT and refused participation in OT and PT during the certification period. He also refused SN services stating that 'what are you doing for me other than checking my BP.   At home with daughter and son on arrival of the RN.  Speech is dysarthric and appearing sleepy and tired. He mentions that he stays awake the whole night despite taking melatonin and trazodone at HS.  Breath sounds are clear in the bilateral airways, he still has a non-productive cough, but he denies SOB/respiratory distress.  No complaints of chest pain, headache, or dizziness.  Bowel sounds are present x 4 quadrants, last bm was today. He has urinary hesitancy but denies dysuria. Patient continues to require assistance with ADLs and IADLs, his daughter being the sole caregiver and manages his medical appointment as well as his medications. SN services discontinued per patient's request.

## 2020-11-29 ENCOUNTER — Other Ambulatory Visit

## 2020-12-01 ENCOUNTER — Other Ambulatory Visit

## 2020-12-02 ENCOUNTER — Other Ambulatory Visit

## 2020-12-03 ENCOUNTER — Other Ambulatory Visit

## 2020-12-04 ENCOUNTER — Other Ambulatory Visit: Admitting: Rehabilitative and Restorative Service Providers"

## 2020-12-04 ENCOUNTER — Other Ambulatory Visit

## 2020-12-04 LAB — CARDIAC EVENT MONITOR: BSA: 1.94 m2

## 2020-12-05 ENCOUNTER — Other Ambulatory Visit

## 2020-12-06 ENCOUNTER — Other Ambulatory Visit

## 2020-12-11 ENCOUNTER — Other Ambulatory Visit

## 2020-12-11 ENCOUNTER — Other Ambulatory Visit: Admitting: Rehabilitative and Restorative Service Providers"

## 2020-12-12 ENCOUNTER — Encounter

## 2020-12-12 DIAGNOSIS — Z23 Encounter for immunization: Secondary | ICD-10-CM | POA: Diagnosis not present

## 2020-12-12 DIAGNOSIS — N1832 Chronic kidney disease, stage 3b: Secondary | ICD-10-CM | POA: Diagnosis not present

## 2020-12-12 DIAGNOSIS — I129 Hypertensive chronic kidney disease with stage 1 through stage 4 chronic kidney disease, or unspecified chronic kidney disease: Secondary | ICD-10-CM | POA: Diagnosis not present

## 2020-12-12 DIAGNOSIS — I119 Hypertensive heart disease without heart failure: Secondary | ICD-10-CM | POA: Diagnosis not present

## 2020-12-12 DIAGNOSIS — I6330 Cerebral infarction due to thrombosis of unspecified cerebral artery: Secondary | ICD-10-CM | POA: Diagnosis not present

## 2020-12-13 ENCOUNTER — Other Ambulatory Visit

## 2020-12-14 DIAGNOSIS — I69322 Dysarthria following cerebral infarction: Secondary | ICD-10-CM | POA: Diagnosis not present

## 2020-12-14 DIAGNOSIS — I69393 Ataxia following cerebral infarction: Secondary | ICD-10-CM | POA: Diagnosis not present

## 2020-12-15 ENCOUNTER — Other Ambulatory Visit

## 2020-12-18 ENCOUNTER — Encounter: Admitting: Rehabilitative and Restorative Service Providers"

## 2020-12-19 ENCOUNTER — Other Ambulatory Visit

## 2020-12-25 ENCOUNTER — Other Ambulatory Visit

## 2020-12-27 ENCOUNTER — Encounter

## 2020-12-28 ENCOUNTER — Other Ambulatory Visit

## 2021-01-03 ENCOUNTER — Observation Stay (HOSPITAL_COMMUNITY)
Admission: EM | Admit: 2021-01-03 | Discharge: 2021-01-05 | Disposition: A | Discharge status: Home / Self Care | Payer: Medicare HMO | Attending: Family Medicine | Admitting: Family Medicine

## 2021-01-03 ENCOUNTER — Encounter (HOSPITAL_COMMUNITY): Payer: Self-pay | Admitting: Emergency Medicine

## 2021-01-03 ENCOUNTER — Other Ambulatory Visit: Payer: Self-pay

## 2021-01-03 DIAGNOSIS — I130 Hypertensive heart and chronic kidney disease with heart failure and stage 1 through stage 4 chronic kidney disease, or unspecified chronic kidney disease: Secondary | ICD-10-CM | POA: Insufficient documentation

## 2021-01-03 DIAGNOSIS — I959 Hypotension, unspecified: Secondary | ICD-10-CM | POA: Diagnosis not present

## 2021-01-03 DIAGNOSIS — Z79899 Other long term (current) drug therapy: Secondary | ICD-10-CM | POA: Insufficient documentation

## 2021-01-03 DIAGNOSIS — R2681 Unsteadiness on feet: Secondary | ICD-10-CM | POA: Diagnosis not present

## 2021-01-03 DIAGNOSIS — R0902 Hypoxemia: Secondary | ICD-10-CM | POA: Diagnosis not present

## 2021-01-03 DIAGNOSIS — I5032 Chronic diastolic (congestive) heart failure: Secondary | ICD-10-CM | POA: Diagnosis not present

## 2021-01-03 DIAGNOSIS — R197 Diarrhea, unspecified: Secondary | ICD-10-CM | POA: Insufficient documentation

## 2021-01-03 DIAGNOSIS — R42 Dizziness and giddiness: Secondary | ICD-10-CM | POA: Diagnosis not present

## 2021-01-03 DIAGNOSIS — N183 Chronic kidney disease, stage 3 unspecified: Secondary | ICD-10-CM | POA: Diagnosis present

## 2021-01-03 DIAGNOSIS — R55 Syncope and collapse: Principal | ICD-10-CM | POA: Diagnosis present

## 2021-01-03 DIAGNOSIS — Z7982 Long term (current) use of aspirin: Secondary | ICD-10-CM | POA: Insufficient documentation

## 2021-01-03 DIAGNOSIS — N1831 Chronic kidney disease, stage 3a: Secondary | ICD-10-CM | POA: Diagnosis not present

## 2021-01-03 DIAGNOSIS — Z8546 Personal history of malignant neoplasm of prostate: Secondary | ICD-10-CM

## 2021-01-03 DIAGNOSIS — Z87891 Personal history of nicotine dependence: Secondary | ICD-10-CM | POA: Insufficient documentation

## 2021-01-03 DIAGNOSIS — Z20822 Contact with and (suspected) exposure to covid-19: Secondary | ICD-10-CM | POA: Insufficient documentation

## 2021-01-03 DIAGNOSIS — R7303 Prediabetes: Secondary | ICD-10-CM | POA: Diagnosis present

## 2021-01-03 DIAGNOSIS — Z85830 Personal history of malignant neoplasm of bone: Secondary | ICD-10-CM | POA: Diagnosis not present

## 2021-01-03 DIAGNOSIS — R000 Tachycardia, unspecified: Secondary | ICD-10-CM | POA: Diagnosis not present

## 2021-01-03 LAB — CBC WITH DIFFERENTIAL/PLATELET
Abs Immature Granulocytes: 0.03 10*3/uL (ref 0.00–0.07)
Basophils Absolute: 0 10*3/uL (ref 0.0–0.1)
Basophils Relative: 0 %
Eosinophils Absolute: 0 10*3/uL (ref 0.0–0.5)
Eosinophils Relative: 1 %
HCT: 40.2 % (ref 39.0–52.0)
Hemoglobin: 13.1 g/dL (ref 13.0–17.0)
Immature Granulocytes: 0 %
Lymphocytes Relative: 12 %
Lymphs Abs: 0.8 10*3/uL (ref 0.7–4.0)
MCH: 33.2 pg (ref 26.0–34.0)
MCHC: 32.6 g/dL (ref 30.0–36.0)
MCV: 102 fL — ABNORMAL HIGH (ref 80.0–100.0)
Monocytes Absolute: 0.4 10*3/uL (ref 0.1–1.0)
Monocytes Relative: 5 %
Neutro Abs: 5.9 10*3/uL (ref 1.7–7.7)
Neutrophils Relative %: 82 %
Platelets: 36 10*3/uL — ABNORMAL LOW (ref 150–400)
RBC: 3.94 MIL/uL — ABNORMAL LOW (ref 4.22–5.81)
RDW: 13.5 % (ref 11.5–15.5)
WBC: 7.2 10*3/uL (ref 4.0–10.5)
nRBC: 0 % (ref 0.0–0.2)

## 2021-01-03 LAB — URINALYSIS, ROUTINE W REFLEX MICROSCOPIC
Bilirubin Urine: NEGATIVE
Glucose, UA: NEGATIVE mg/dL
Hgb urine dipstick: NEGATIVE
Ketones, ur: 20 mg/dL — AB
Nitrite: NEGATIVE
Protein, ur: NEGATIVE mg/dL
Specific Gravity, Urine: 1.017 (ref 1.005–1.030)
pH: 6 (ref 5.0–8.0)

## 2021-01-03 LAB — COMPREHENSIVE METABOLIC PANEL
ALT: 21 U/L (ref 0–44)
AST: 37 U/L (ref 15–41)
Albumin: 4 g/dL (ref 3.5–5.0)
Alkaline Phosphatase: 111 U/L (ref 38–126)
Anion gap: 10 (ref 5–15)
BUN: 23 mg/dL (ref 8–23)
CO2: 19 mmol/L — ABNORMAL LOW (ref 22–32)
Calcium: 8.8 mg/dL — ABNORMAL LOW (ref 8.9–10.3)
Chloride: 108 mmol/L (ref 98–111)
Creatinine, Ser: 1.84 mg/dL — ABNORMAL HIGH (ref 0.61–1.24)
GFR, Estimated: 35 mL/min — ABNORMAL LOW (ref >60)
Glucose, Bld: 126 mg/dL — ABNORMAL HIGH (ref 70–99)
Potassium: 4.1 mmol/L (ref 3.5–5.1)
Sodium: 137 mmol/L (ref 135–145)
Total Bilirubin: 1.1 mg/dL (ref 0.3–1.2)
Total Protein: 7 g/dL (ref 6.5–8.1)

## 2021-01-03 LAB — TROPONIN I (HIGH SENSITIVITY)
Troponin I (High Sensitivity): 35 ng/L — ABNORMAL HIGH (ref <18)
Troponin I (High Sensitivity): 34 ng/L — ABNORMAL HIGH (ref <18)

## 2021-01-03 LAB — CBG MONITORING, ED: Glucose-Capillary: 114 mg/dL — ABNORMAL HIGH (ref 70–99)

## 2021-01-03 MED ORDER — ASPIRIN EC 81 MG PO TBEC
81.0000 mg | DELAYED_RELEASE_TABLET | Freq: Every day | ORAL | Status: DC
  Administered 2021-01-04 – 2021-01-05 (×2): 81 mg via ORAL
  Filled 2021-01-03 (×2): qty 1

## 2021-01-03 MED ORDER — HYDRALAZINE HCL 25 MG PO TABS
25.0000 mg | ORAL_TABLET | Freq: Three times a day (TID) | ORAL | Status: DC
  Administered 2021-01-04 – 2021-01-05 (×4): 25 mg via ORAL
  Filled 2021-01-03 (×4): qty 1

## 2021-01-03 MED ORDER — TRAZODONE HCL 50 MG PO TABS
50.0000 mg | ORAL_TABLET | Freq: Every day | ORAL | Status: DC
  Administered 2021-01-03 – 2021-01-04 (×2): 50 mg via ORAL
  Filled 2021-01-03 (×2): qty 1

## 2021-01-03 MED ORDER — LOSARTAN POTASSIUM 50 MG PO TABS
50.0000 mg | ORAL_TABLET | Freq: Two times a day (BID) | ORAL | Status: DC
  Administered 2021-01-04 – 2021-01-05 (×3): 50 mg via ORAL
  Filled 2021-01-03 (×3): qty 1

## 2021-01-03 MED ORDER — VITAMIN B-12 1000 MCG PO TABS
1000.0000 ug | ORAL_TABLET | Freq: Every day | ORAL | Status: DC
  Administered 2021-01-04 – 2021-01-05 (×2): 1000 ug via ORAL
  Filled 2021-01-03 (×2): qty 1

## 2021-01-03 MED ORDER — MELATONIN 3 MG PO TABS
3.0000 mg | ORAL_TABLET | Freq: Every day | ORAL | Status: DC
  Administered 2021-01-03 – 2021-01-04 (×2): 3 mg via ORAL
  Filled 2021-01-03 (×2): qty 1

## 2021-01-03 MED ORDER — HEPARIN SODIUM (PORCINE) 5000 UNIT/ML IJ SOLN
5000.0000 [IU] | Freq: Three times a day (TID) | INTRAMUSCULAR | Status: DC
  Administered 2021-01-04: 5000 [IU] via SUBCUTANEOUS
  Filled 2021-01-03: qty 1

## 2021-01-03 MED ORDER — ATORVASTATIN CALCIUM 80 MG PO TABS
80.0000 mg | ORAL_TABLET | Freq: Every day | ORAL | Status: DC
  Administered 2021-01-03 – 2021-01-04 (×2): 80 mg via ORAL
  Filled 2021-01-03: qty 1
  Filled 2021-01-03: qty 2

## 2021-01-03 MED ORDER — CLOPIDOGREL BISULFATE 75 MG PO TABS
75.0000 mg | ORAL_TABLET | Freq: Every morning | ORAL | Status: DC
  Administered 2021-01-04: 75 mg via ORAL
  Filled 2021-01-03 (×2): qty 1

## 2021-01-03 MED ORDER — OMEGA-3-ACID ETHYL ESTERS 1 G PO CAPS
1000.0000 mg | ORAL_CAPSULE | Freq: Every day | ORAL | Status: DC
  Administered 2021-01-04 – 2021-01-05 (×2): 1000 mg via ORAL
  Filled 2021-01-03 (×3): qty 1

## 2021-01-03 MED ORDER — ACETAMINOPHEN 650 MG RE SUPP: 650.0000 mg | Freq: Four times a day (QID) | RECTAL | Status: DC | PRN

## 2021-01-03 MED ORDER — PANTOPRAZOLE SODIUM 40 MG PO TBEC
40.0000 mg | DELAYED_RELEASE_TABLET | Freq: Every day | ORAL | Status: DC
  Administered 2021-01-04 – 2021-01-05 (×2): 40 mg via ORAL
  Filled 2021-01-03 (×2): qty 1

## 2021-01-03 MED ORDER — ACETAMINOPHEN 325 MG PO TABS
650.0000 mg | ORAL_TABLET | Freq: Four times a day (QID) | ORAL | Status: DC | PRN
  Administered 2021-01-05: 650 mg via ORAL
  Filled 2021-01-03: qty 2

## 2021-01-03 MED ORDER — GABAPENTIN 300 MG PO CAPS
600.0000 mg | ORAL_CAPSULE | Freq: Two times a day (BID) | ORAL | Status: DC
  Administered 2021-01-03 – 2021-01-05 (×4): 600 mg via ORAL
  Filled 2021-01-03 (×4): qty 2

## 2021-01-03 MED ORDER — GABAPENTIN 600 MG PO TABS
600.0000 mg | ORAL_TABLET | Freq: Two times a day (BID) | ORAL | Status: DC
  Filled 2021-01-03: qty 1

## 2021-01-03 MED ORDER — DOCUSATE SODIUM 100 MG PO CAPS: 100.0000 mg | ORAL_CAPSULE | Freq: Two times a day (BID) | ORAL | Status: DC | PRN

## 2021-01-03 MED ORDER — SODIUM CHLORIDE 0.9 % IV BOLUS
1000.0000 mL | Freq: Once | INTRAVENOUS | Status: AC
  Administered 2021-01-03: 1000 mL via INTRAVENOUS

## 2021-01-03 MED ORDER — METOPROLOL TARTRATE 12.5 MG HALF TABLET
12.5000 mg | ORAL_TABLET | Freq: Two times a day (BID) | ORAL | Status: DC
  Administered 2021-01-04 – 2021-01-05 (×3): 12.5 mg via ORAL
  Filled 2021-01-03 (×3): qty 1

## 2021-01-03 NOTE — ED Notes (Signed)
Pt requested for RN to contact Ginger (close family friend) with any updates.   [phone]

## 2021-01-03 NOTE — ED Triage Notes (Signed)
Pt arrives via [facility] from [facility] after having a syncopal episode. Pt reports he has had diarrhea x1 day, he was standing in line and felt like he needed to use the bathroom, got lightheaded, pt sat down, then had LOC. Upon ems arrival pt was cool and clammy, had faint pulses, hypotension. EMS gave 562ml NS. aox4

## 2021-01-03 NOTE — ED Notes (Signed)
Pt stated that he wants to be full code instead of DNR. MD made aware.

## 2021-01-03 NOTE — ED Notes (Signed)
RN updated pts sister Edd Fabian) regarding pts status and next steps.

## 2021-01-03 NOTE — ED Provider Notes (Signed)
[facility] EMERGENCY DEPARTMENT Provider Note   CSN: [id number] Arrival date & time: 01/03/21  1656     History Chief Complaint  Patient presents with   Loss of Consciousness   Diarrhea    Logan Brown. is a 87 y.o. male who presents emergency department chief complaint of syncope.  Patient states that when he woke up this morning he was feeling very lightheaded.  He went to [place] with a friend of his and states that he suddenly felt like he had to use the bathroom.  He was urgently making his way toward the bathroom when he got extremely lightheaded lost consciousness and lost control of his bowel at [facility].  He states that he was not having diarrhea until that time.  He denies dark or tarry stools.  He is not on any blood thinners.  He does take antihypertensive medications but has not had any recent changes in his medications.  He denies any recent weight loss, shortness of breath.  EMS report that he was diaphoretic and hypotensive prior to arrival and given 500 of normal saline with improvement in his pressures.  Patient denies chest pain or shortness of breath   Loss of Consciousness Diarrhea     Past Medical History:  Diagnosis Date   Arthritis    Atrial fibrillation ([place])    Bone cancer ([place])    Cardiomyopathy ([place])    Chronic kidney disease    Hyperlipidemia    Hypertension    Prostate cancer ([place]) 1997   Unstable gait 04/16/2018   unstable gait in home    Patient Active Problem List   Diagnosis Date Noted   Chronic use of opiate for therapeutic purpose 31/51/7616   Uncomplicated opioid dependence ([place]) 05/31/2020   Spine metastasis ([place]) 01/17/2020   Multilevel spine pain 01/17/2020   At maximum risk for fall 01/16/2020   Chronic diastolic CHF (congestive heart failure) ([place]) 01/10/2019   Bradycardia by electrocardiogram 05/13/2018   Orthostatic hypotension 05/13/2018   Renal insufficiency 04/05/2018   Cancer-related pain  04/05/2018   GERD (gastroesophageal reflux disease) 04/05/2018   Neurogenic pain 04/05/2018   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 ([place]) 04/05/2018   History of prostate cancer 04/05/2018   Osteopenia of spine 04/05/2018   Osteopenia determined by x-ray 04/05/2018   Osteoarthritis of facet joint of lumbar spine 04/05/2018   Osteoarthritis involving multiple joints 04/05/2018   DDD (degenerative disc disease), lumbar 04/05/2018   Osteoarthritis of hip (Bilateral) 04/05/2018   Abnormal MRI, lumbar spine 04/05/2018   Lumbar central spinal stenosis, w/o neurogenic claudication 04/05/2018   Lumbar foraminal stenosis (Bilateral: L4-5) (Right: L1-2, L5-S1) (Left: L2-3, L3-4) 04/05/2018   Lumbar facet hypertrophy 04/05/2018   Lumbar facet joint syndrome (Bilateral) 04/05/2018   Metastatic cancer to spine ([place]) 04/05/2018   History of kyphoplasty (L2) 04/05/2018   Low vitamin B12 level 03/30/2018   Chronic pain syndrome 03/23/2018   Long term current use of opiate analgesic 03/23/2018   Long term prescription benzodiazepine use 03/23/2018   Pharmacologic therapy 03/23/2018   Disorder of skeletal system 03/23/2018   Problems influencing health status 03/23/2018   Chronic low back pain (1ry area of Pain) (Bilateral) (R>L) w/o sciatica 03/23/2018   Compression fracture of L2 lumbar vertebra, sequela 01/13/2018   Scrotal itching 01/13/2018   Anxiety 01/13/2018   Prostate cancer ([place]) 01/12/2018   Hypocalcemia 01/12/2018   Overweight (BMI 25.0-29.9) 10/16/2017   Nonischemic cardiomyopathy ([place]) 03/27/2017   Atrial fibrillation (  [place]) 03/27/2017   CKD (chronic kidney disease) stage 3, GFR 30-59 ml/min 10/16/2016   Depression, major, in remission ([place]) 03/24/2016   HTN (hypertension) 03/01/2015   Mixed hyperlipidemia 03/01/2015   Prediabetes 03/01/2015   Vitamin D deficiency 03/01/2015   Medication management 03/01/2015    Past Surgical History:  Procedure Laterality Date   CATARACT  EXTRACTION     EYE SURGERY Bilateral    IOL/CE on Lt in 1998 and Rt in 2011.   PROSTATE SURGERY     WRIST FOREIGN BODY REMOVAL     1957 glass       History reviewed. No pertinent family history.  Social History   Tobacco Use   Smoking status: Former    Types: Cigarettes    Quit date: 03/14/1975    Years since quitting: 45.8   Smokeless tobacco: Never  Vaping Use   Vaping Use: Never used  Substance Use Topics   Alcohol use: No    Alcohol/week: 0.0 standard drinks    Comment: occ   Drug use: No    Home Medications Prior to Admission medications   Medication Sig Start Date End Date Taking? Authorizing Provider  aspirin EC 81 MG tablet Take 81 mg by mouth daily.    [provider]  Calcium Carbonate Antacid (CALCIUM CARBONATE PO) Take 1,800 mg of elemental calcium by mouth 2 (two) times daily with a meal.     [provider]  Cholecalciferol (VITAMIN D3) 125 MCG (5000 UT) CAPS Take 5,000 Units by mouth daily.     [provider]  COVID-19 mRNA vaccine, Pfizer, 30 MCG/0.3ML injection Inject into the muscle. 06/13/20   Carlyle Basques, MD  enzalutamide Gillermina Phy) 40 MG capsule Take 2 capsules (80 mg total) by mouth daily. 02/15/20   Wyatt Portela, MD  famotidine (PEPCID) 40 MG tablet Take  1 tablet  Daily  for Heart burn & Acid Indigestion 10/04/20   [name], MD  furosemide (LASIX) 40 MG tablet Take 40 mg by mouth every Monday, Wednesday, and Friday. 04/13/19   [provider]  gabapentin (NEURONTIN) 600 MG tablet Take  1 tablet  3 x /day  as needed for  Chronic Pain 05/29/20   [name], MD  labetalol (NORMODYNE) 100 MG tablet Take 0.5 tablets (50 mg total) by mouth 2 (two) times daily. 01/10/20   Minus Breeding, MD  losartan (COZAAR) 100 MG tablet Take 1 tablet Daily for BP Patient taking differently: Take 100 mg by mouth daily. 05/06/19   [name], MD  oxyCODONE (OXY IR/ROXICODONE) 5 MG immediate release tablet Take 1 tablet (5  mg total) by mouth every 6 (six) hours as needed for severe pain. Must last 30 days. 04/17/20 05/17/20  [name], MD  vitamin B-12 (CYANOCOBALAMIN) 1000 MCG tablet Take  1 tablet  Daily 05/28/20   [name], MD    Allergies    Ace inhibitors and Hydrocodone  Review of Systems   Review of Systems  Cardiovascular:  Positive for syncope.  Gastrointestinal:  Positive for diarrhea.  Ten systems reviewed and are negative for acute change, except as noted in the HPI.   Physical Exam Updated Vital Signs BP 132/83   Pulse 68   Temp 97.7 F (36.5 C) (Oral)   Resp 17   SpO2 96%   Physical Exam Vitals and nursing note reviewed.  Constitutional:      General: He is not in acute distress.    Appearance: He is well-developed. He is  not diaphoretic.  HENT:     Head: Normocephalic and atraumatic.  Eyes:     General: No scleral icterus.    Conjunctiva/sclera: Conjunctivae normal.  Cardiovascular:     Rate and Rhythm: Normal rate and regular rhythm.     Heart sounds: Normal heart sounds.  Pulmonary:     Effort: Pulmonary effort is normal. No respiratory distress.     Breath sounds: Normal breath sounds.  Abdominal:     Palpations: Abdomen is soft.     Tenderness: There is no abdominal tenderness.  Musculoskeletal:     Cervical back: Normal range of motion and neck supple.     Right lower leg: No edema.     Left lower leg: No edema.  Skin:    General: Skin is warm and dry.  Neurological:     Mental Status: He is alert.  Psychiatric:        Behavior: Behavior normal.    ED Results / Procedures / Treatments   Labs (all labs ordered are listed, but only abnormal results are displayed) Labs Reviewed  COMPREHENSIVE METABOLIC PANEL - Abnormal; Notable for the following components:      Result Value   CO2 19 (*)    Glucose, Bld 126 (*)    Creatinine, Ser 1.84 (*)    Calcium 8.8 (*)    GFR, Estimated 35 (*)    All other components within normal limits  CBG  MONITORING, ED - Abnormal; Notable for the following components:   Glucose-Capillary 114 (*)    All other components within normal limits  TROPONIN I (HIGH SENSITIVITY) - Abnormal; Notable for the following components:   Troponin I (High Sensitivity) 35 (*)    All other components within normal limits  CBC WITH DIFFERENTIAL/PLATELET  URINALYSIS, ROUTINE W REFLEX MICROSCOPIC  CBC WITH DIFFERENTIAL/PLATELET  TROPONIN I (HIGH SENSITIVITY)    EKG EKG Interpretation  Date/Time:  Thursday January 03 2021 17:13:47 EDT Ventricular Rate:  64 PR Interval:  141 QRS Duration: 78 QT Interval:  453 QTC Calculation: 468 R Axis:   -16 Text Interpretation: Sinus rhythm Borderline left axis deviation Probable anteroseptal infarct, old Since last tracing Rate faster Confirmed by Calvert Cantor [phone]) on 01/03/2021 5:18:13 PM  Radiology No results found.  Procedures Procedures   Medications Ordered in ED Medications - No data to display  ED Course  I have reviewed the triage vital signs and the nursing notes.  Pertinent labs & imaging results that were available during my care of the patient were reviewed by me and considered in my medical decision making (see chart for details).    MDM Rules/Calculators/A&P                          87 y/o male here with syncope. The differential for syncope is extensive and includes, but is not limited to: arrythmia (Vtach, SVT, SSS, sinus arrest, AV block, bradycardia) aortic stenosis, AMI, HOCM, PE, atrial myxoma, pulmonary hypertension, orthostatic hypotension, (hypovolemia, drug effect, GB syndrome, micturition, cough, [place]) carotid sinus sensitivity, Seizure, TIA/CVA, hypoglycemia,  Vertigo. I ordered and reviewed labs- CBC with mild macrocytic anemia and thrombocytopeia, CMP shows renal insufficiency with mild elevation of creatinine. UA and resp panel negative. Troponin elevated. EKG NSR R-64. Patient will be admitted to the hospitalist  service.  Final Clinical Impression(s) / ED Diagnoses Final diagnoses:  None    Rx / DC Orders ED Discharge Orders     None  Margarita Mail, PA-C 01/04/21 1231    Truddie Hidden, MD 01/07/21 [phone]

## 2021-01-03 NOTE — H&P (Addendum)
History and Physical    Logan Brown. WSF:[id number] DOB: 11/05/1931 DOA: 01/03/2021  PCP: [name], MD  Patient coming from: Home.  Chief Complaint: Loss of consciousness.  HPI: Logan Brown. is a 87 y.o. male with history of paroxysmal atrial fibrillation not on anticoagulation with history of bleeding from the face as per the patient, hypertension, prediabetes, hyperlipidemia, chronic kidney disease stage III and prostate cancer was at the [facility] shopping when patient wanted to go to the bathroom was standing in the line when patient felt dizzy and lost consciousness.  Patient states he may have lost consciousness for about 2 minutes.  As per the report on EMS arrival patient was found to be hypotensive was given a fluid bolus.  Patient states when he woke up yesterday morning he felt mildly dizzy.  Patient also had a previous admission in 2021 November when he was orthostatic.  Patient had incontinence of his bowel and it was diarrhea.   Patient usually moves around [place] in [place].  Patient states when he was in [place] 3 weeks ago he was admitted to the hospital for stroke when he had symptom of dizziness.  We are unable to access his records over the at the hospital.  ED Course: In the ER patient was given another fluid bolus labs show high sensitive troponins of 35 and 34 creatinine around 1.8 is around the baseline which is usually around 1.6-1.7 patient's platelet is significantly low at 36 which appears to be new.  Bicarb is 19 and gap is 10.  EKG shows normal sinus rhythm with QTC of 468 ms.  Patient admitted for further management of syncope.  Review of Systems: As per HPI, rest all negative.   Past Medical History:  Diagnosis Date   Arthritis    Atrial fibrillation ([place])    Bone cancer ([place])    Cardiomyopathy ([place])    Chronic kidney disease    Hyperlipidemia    Hypertension    Prostate cancer ([place]) 1997   Unstable gait  04/16/2018   unstable gait in home    Past Surgical History:  Procedure Laterality Date   CATARACT EXTRACTION     EYE SURGERY Bilateral    IOL/CE on Lt in 1998 and Rt in 2011.   PROSTATE SURGERY     WRIST FOREIGN BODY REMOVAL     1957 glass     reports that he quit smoking about 45 years ago. He has never used smokeless tobacco. He reports that he does not drink alcohol and does not use drugs.  Allergies  Allergen Reactions   Ace Inhibitors Swelling and Other (See Comments)    Angioedema   Hydrocodone Nausea And Vomiting    History reviewed. No pertinent family history.  Prior to Admission medications   Medication Sig Start Date End Date Taking? Authorizing Provider  acetaminophen (TYLENOL) 325 MG tablet Take 650 mg by mouth every 6 (six) hours as needed.   Yes [provider]  aspirin EC 81 MG tablet Take 81 mg by mouth daily.   Yes [provider]  atorvastatin (LIPITOR) 80 MG tablet Take 80 mg by mouth at bedtime. 12/30/20  Yes [provider]  Calcium Carb-Cholecalciferol (CALCIUM 600 + D PO) Take 1 tablet by mouth in the morning and at bedtime.   Yes [provider]  clopidogrel (PLAVIX) 75 MG tablet Take 75 mg by mouth every morning. 12/21/20  Yes [provider]  docusate sodium (COLACE) 100 MG  capsule Take 100 mg by mouth 2 (two) times daily as needed for mild constipation.   Yes [provider]  gabapentin (NEURONTIN) 600 MG tablet Take  1 tablet  3 x /day  as needed for  Chronic Pain Patient taking differently: Take 600 mg by mouth 2 (two) times daily. 05/29/20  Yes [name], MD  hydrALAZINE (APRESOLINE) 25 MG tablet Take 25 mg by mouth in the morning and at bedtime. hold if SBP is under 150 12/26/20  Yes [provider]  losartan (COZAAR) 100 MG tablet Take 1 tablet Daily for BP Patient taking differently: Take 50 mg by mouth 2 (two) times daily. Hold if SBP in under 110 05/06/19  Yes [name], MD  melatonin 3 MG TABS tablet Take 3 mg by mouth at bedtime.   Yes [provider]  metoprolol tartrate (LOPRESSOR) 25 MG tablet Take 12.5 mg by mouth 2 (two) times daily. Hold if SBP is less than 110 or heart rate is less than 60 11/21/20  Yes [provider]  Omega 3 1000 MG CAPS Take 1,000 mg by mouth daily.   Yes [provider]  pantoprazole (PROTONIX) 40 MG tablet Take 40 mg by mouth daily. 12/21/20  Yes [provider]  traZODone (DESYREL) 50 MG tablet Take 50 mg by mouth at bedtime.   Yes [provider]  vitamin B-12 (CYANOCOBALAMIN) 1000 MCG tablet Take  1 tablet  Daily Patient taking differently: Take 1,000 mcg by mouth daily. 05/28/20  Yes [name], MD  COVID-19 mRNA vaccine, Pfizer, 30 MCG/0.3ML injection Inject into the muscle. Patient not taking: No sig reported 06/13/20   Carlyle Basques, MD  enzalutamide Gillermina Phy) 40 MG capsule Take 2 capsules (80 mg total) by mouth daily. Patient not taking: Reported on 01/03/2021 02/15/20   Wyatt Portela, MD  famotidine (PEPCID) 40 MG tablet Take  1 tablet  Daily  for Heart burn & Acid Indigestion Patient not taking: Reported on 01/03/2021 10/04/20   [name], MD  labetalol (NORMODYNE) 100 MG tablet Take 0.5 tablets (50 mg total) by mouth 2 (two) times daily. Patient not taking: Reported on 01/03/2021 01/10/20   Minus Breeding, MD  oxyCODONE (OXY IR/ROXICODONE) 5 MG immediate release tablet Take 1 tablet (5 mg total) by mouth every 6 (six) hours as needed for severe pain. Must last 30 days. Patient not taking: Reported on 01/03/2021 04/17/20 05/17/20  [name], MD    Physical Exam: Constitutional: Moderately built and nourished. Vitals:   01/03/21 2130 01/03/21 2145 01/03/21 2200 01/03/21 2215  BP: (!) 147/78 (!) 150/69 (!) 147/86 (!) 156/67  Pulse: 64 63 71 84  Resp: 10 12 15 16   Temp:      TempSrc:      SpO2: 99% 98% 98% 99%   Eyes: Anicteric no pallor. ENMT:  No discharge from the ears eyes nose and mouth. Neck: No mass felt.  No neck rigidity. Respiratory: No rhonchi or crepitations. Cardiovascular: S1-S2 heard. Abdomen: Soft nontender bowel sound present. Musculoskeletal: No edema. Skin: No rash. Neurologic: Alert awake oriented to time place and person.  Moves all extremities. Psychiatric: Appears normal.  Normal affect.   Labs on Admission: I have personally reviewed following labs and imaging studies  CBC: Recent Labs  Lab 01/03/21 1846  WBC 7.2  NEUTROABS 5.9  HGB 13.1  HCT 40.2  MCV 102.0*  PLT 36*   Basic Metabolic Panel: Recent Labs  Lab 01/03/21 1755  NA 137  K  4.1  CL 108  CO2 19*  GLUCOSE 126*  BUN 23  CREATININE 1.84*  CALCIUM 8.8*   GFR: CrCl cannot be calculated (Unknown ideal weight.). Liver Function Tests: Recent Labs  Lab 01/03/21 1755  AST 37  ALT 21  ALKPHOS 111  BILITOT 1.1  PROT 7.0  ALBUMIN 4.0   No results for input(s): LIPASE, AMYLASE in the last 168 hours. No results for input(s): AMMONIA in the last 168 hours. Coagulation Profile: No results for input(s): INR, PROTIME in the last 168 hours. Cardiac Enzymes: No results for input(s): CKTOTAL, CKMB, CKMBINDEX, TROPONINI in the last 168 hours. BNP (last 3 results) No results for input(s): PROBNP in the last 8760 hours. HbA1C: No results for input(s): HGBA1C in the last 72 hours. CBG: Recent Labs  Lab 01/03/21 1753  GLUCAP 114*   Lipid Profile: No results for input(s): CHOL, HDL, LDLCALC, TRIG, CHOLHDL, LDLDIRECT in the last 72 hours. Thyroid Function Tests: No results for input(s): TSH, T4TOTAL, FREET4, T3FREE, THYROIDAB in the last 72 hours. Anemia Panel: No results for input(s): VITAMINB12, FOLATE, FERRITIN, TIBC, IRON, RETICCTPCT in the last 72 hours. Urine analysis:    Component Value Date/Time   COLORURINE YELLOW 01/03/2021 1722   APPEARANCEUR CLEAR 01/03/2021 1722   LABSPEC 1.017 01/03/2021 1722   PHURINE 6.0  01/03/2021 1722   GLUCOSEU NEGATIVE 01/03/2021 1722   HGBUR NEGATIVE 01/03/2021 1722   BILIRUBINUR NEGATIVE 01/03/2021 1722   KETONESUR 20 (A) 01/03/2021 1722   PROTEINUR NEGATIVE 01/03/2021 1722   UROBILINOGEN 1.0 04/02/2009 1921   NITRITE NEGATIVE 01/03/2021 1722   LEUKOCYTESUR TRACE (A) 01/03/2021 1722   Sepsis Labs: @LABRCNTIP (procalcitonin:4,lacticidven:4) )No results found for this or any previous visit (from the past 240 hour(s)).   Radiological Exams on Admission: No results found.  EKG: Independently reviewed.  Normal sinus rhythm with QTC of 468 ms.  Assessment/Plan Principal Problem:   Syncope Active Problems:   Prediabetes   CKD (chronic kidney disease) stage 3, GFR 30-59 ml/min (HCC)   History of prostate cancer   Chronic diastolic CHF (congestive heart failure) (HCC)    Syncope -patient was hypotensive at the site when patient's symptoms happened.  Patient has had previous episode of orthostatic changes.  Could be the cause of patient's syncope.  However patient has history of A. fib we will continue to monitor in telemetry.  Check orthostatics.  Check cortisol level.  Patient did receive fluid bolus in the ER. Thrombocytopenia appears to be new.  Significantly low platelets.  We will recheck CBC and if it is confirming to be truly thrombocytopenic then will need further work-up. Patient states he has had recent stroke about 3 weeks ago when patient was in [place].  We need to get records from there.  Patient states he had symptoms of dizziness at the time pain. Diarrhea -patient did have an episode of diarrhea.  Closely observe patient has had any further episodes. History of paroxysmal atrial fibrillation not on anticoagulation due to history of bleeding.  Presently on aspirin and Plavix.  Takes metoprolol for rate control.  EKG shows sinus rhythm. History of cardiomyopathy last EF measured in October 2021 was 55 to 60% with grade 1 diastolic dysfunction  appears compensated. Chronic kidney disease stage III creatinine is around the baseline.  Follow metabolic panel. Hypertension patient takes hydralazine metoprolol and ARB.  All the medicine that her dose from morning.  Patient has orthostatic apical hypertensive metabolic. Macrocytosis patient is on B12 supplements. Will check anemia panel.  COVID test pending.   DVT prophylaxis: Holding anticoagulation due to thrombocytopenia. Code Status: Full code.  This is a reversal from previous. Family Communication: Discussed with patient. Disposition Plan: Home. Consults called: None. Admission status: Observation.   Rise Patience MD [facility] Pager [phone].  If 7PM-7AM, please contact night-coverage [email or web address] Password [email or web address]  01/03/2021, 11:07 PM

## 2021-01-04 DIAGNOSIS — R55 Syncope and collapse: Secondary | ICD-10-CM | POA: Diagnosis not present

## 2021-01-04 LAB — BASIC METABOLIC PANEL
Anion gap: 5 (ref 5–15)
BUN: 20 mg/dL (ref 8–23)
CO2: 21 mmol/L — ABNORMAL LOW (ref 22–32)
Calcium: 8 mg/dL — ABNORMAL LOW (ref 8.9–10.3)
Chloride: 111 mmol/L (ref 98–111)
Creatinine, Ser: 1.51 mg/dL — ABNORMAL HIGH (ref 0.61–1.24)
GFR, Estimated: 44 mL/min — ABNORMAL LOW (ref >60)
Glucose, Bld: 123 mg/dL — ABNORMAL HIGH (ref 70–99)
Potassium: 3.5 mmol/L (ref 3.5–5.1)
Sodium: 137 mmol/L (ref 135–145)

## 2021-01-04 LAB — CORTISOL: Cortisol, Plasma: 7.2 ug/dL

## 2021-01-04 LAB — CBC WITH DIFFERENTIAL/PLATELET
Abs Immature Granulocytes: 0.01 10*3/uL (ref 0.00–0.07)
Basophils Absolute: 0 10*3/uL (ref 0.0–0.1)
Basophils Relative: 0 %
Eosinophils Absolute: 0.1 10*3/uL (ref 0.0–0.5)
Eosinophils Relative: 2 %
HCT: 33.1 % — ABNORMAL LOW (ref 39.0–52.0)
Hemoglobin: 11 g/dL — ABNORMAL LOW (ref 13.0–17.0)
Immature Granulocytes: 0 %
Lymphocytes Relative: 35 %
Lymphs Abs: 1.8 10*3/uL (ref 0.7–4.0)
MCH: 33.3 pg (ref 26.0–34.0)
MCHC: 33.2 g/dL (ref 30.0–36.0)
MCV: 100.3 fL — ABNORMAL HIGH (ref 80.0–100.0)
Monocytes Absolute: 0.5 10*3/uL (ref 0.1–1.0)
Monocytes Relative: 10 %
Neutro Abs: 2.8 10*3/uL (ref 1.7–7.7)
Neutrophils Relative %: 53 %
Platelets: 123 10*3/uL — ABNORMAL LOW (ref 150–400)
RBC: 3.3 MIL/uL — ABNORMAL LOW (ref 4.22–5.81)
RDW: 13.5 % (ref 11.5–15.5)
WBC: 5.3 10*3/uL (ref 4.0–10.5)
nRBC: 0 % (ref 0.0–0.2)

## 2021-01-04 LAB — RESP PANEL BY RT-PCR (FLU A&B, COVID) ARPGX2
Influenza A by PCR: NEGATIVE
Influenza B by PCR: NEGATIVE
SARS Coronavirus 2 by RT PCR: NEGATIVE

## 2021-01-04 LAB — CREATININE, SERUM
Creatinine, Ser: 1.47 mg/dL — ABNORMAL HIGH (ref 0.61–1.24)
GFR, Estimated: 46 mL/min — ABNORMAL LOW (ref >60)

## 2021-01-04 LAB — RETICULOCYTES
Immature Retic Fract: 13.5 % (ref 2.3–15.9)
RBC.: 3.25 MIL/uL — ABNORMAL LOW (ref 4.22–5.81)
Retic Count, Absolute: 85.8 10*3/uL (ref 19.0–186.0)
Retic Ct Pct: 2.6 % (ref 0.4–3.1)

## 2021-01-04 LAB — IRON AND TIBC
Iron: 46 ug/dL (ref 45–182)
Saturation Ratios: 19 % (ref 17.9–39.5)
TIBC: 244 ug/dL — ABNORMAL LOW (ref 250–450)
UIBC: 198 ug/dL

## 2021-01-04 LAB — VITAMIN B12: Vitamin B-12: 754 pg/mL (ref 180–914)

## 2021-01-04 LAB — FOLATE: Folate: 13.1 ng/mL (ref >5.9)

## 2021-01-04 LAB — FERRITIN: Ferritin: 129 ng/mL (ref 24–336)

## 2021-01-04 MED ORDER — SODIUM CHLORIDE 0.9 % IV SOLN: INTRAVENOUS | Status: DC

## 2021-01-04 MED ORDER — HYDRALAZINE HCL 20 MG/ML IJ SOLN: 10.0000 mg | INTRAMUSCULAR | Status: DC | PRN

## 2021-01-04 NOTE — Evaluation (Signed)
Occupational Therapy Evaluation Patient Details Name: Logan Brown. MRN: [id number] DOB: [birth year] Today's Date: 01/04/2021   History of Present Illness 87 years old male with PMH significant for paroxysmal A. fib not on anticoagulation due to the history of bleeding from the face, hypertension, prediabetes, hyperlipidemia, chronic kidney disease stage IIIa, prostate cancer presented status post syncopal episode.   Clinical Impression   Patient admitted for the above diagnosis.  Patient has a history of syncopal episodes, and was + for orthostatic hypotension prior.  Full orthostatics are pending.  He deferred out of bed due to recent bout of loose stool, and does not wish to repeat "the mess."  Otherwise PTA, he lives in a trailer with his SO, used a SPC for outside mobility, does not drive, and needs no assist for ADL/IADL completion.  Of note BP supine: 167/71 and in sit 182/76.  Deficits impacting independence are listed below.  Currently he is needing generalized supervision for mobility and ADL completion.  O to follow in the acute setting to maximize functional status, but no post acute OT is anticipated.       Recommendations for follow up therapy are one component of a multi-disciplinary discharge planning process, led by the attending physician.  Recommendations may be updated based on patient status, additional functional criteria and insurance authorization.   Follow Up Recommendations  No OT follow up    Assistance Recommended at Discharge PRN, as needed.    Functional Status Assessment  Patient has had a recent decline in their functional status and demonstrates the ability to make significant improvements in function in a reasonable and predictable amount of time.  Equipment Recommendations  Tub/shower seat    Recommendations for Other Services PT consult     Precautions / Restrictions Precautions Precautions: Fall Precaution Comments: admitted for +orthostatics  on a prior admission. Restrictions Weight Bearing Restrictions: No      Mobility Bed Mobility Overal bed mobility: Needs Assistance Bed Mobility: Supine to Sit;Sit to Supine     Supine to sit: Supervision Sit to supine: Supervision        Transfers                   General transfer comment: NT      Balance Overall balance assessment: Needs assistance Sitting-balance support: Feet supported Sitting balance-Leahy Scale: Good     Standing balance support: Reliant on assistive device for balance                               ADL either performed or assessed with clinical judgement   ADL       Grooming: Wash/dry hands;Wash/dry face;Supervision/safety;Sitting       Lower Body Bathing: Supervison/ safety;Sitting/lateral leans       Lower Body Dressing: Supervision/safety;Sitting/lateral leans     Toilet Transfer Details (indicate cue type and reason): deferred by patient   Toileting - Clothing Manipulation Details (indicate cue type and reason): NT       General ADL Comments: patient deferred OOB due to fears surrounding recent bout with loose stool.     Vision Baseline Vision/History: 1 Wears glasses Patient Visual Report: No change from baseline Additional Comments: Does not have glasses in the hospital     Perception Perception Perception: Not tested   Praxis Praxis Praxis: Not tested    Pertinent Vitals/Pain Pain Assessment: No/denies pain     Hand Dominance Right  Extremity/Trunk Assessment Upper Extremity Assessment Upper Extremity Assessment: Overall WFL for tasks assessed   Lower Extremity Assessment Lower Extremity Assessment: Defer to PT evaluation   Cervical / Trunk Assessment Cervical / Trunk Assessment: Kyphotic   Communication Communication Communication: No difficulties;HOH   Cognition Arousal/Alertness: Awake/alert Behavior During Therapy: WFL for tasks assessed/performed Overall Cognitive Status:  Within Functional Limits for tasks assessed                                 General Comments: Mild ST memory deficts, but very functional     General Comments       Exercises     Shoulder Instructions      Home Living Family/patient expects to be discharged to:: Private residence Living Arrangements: Spouse/significant other Available Help at Discharge: Family;Available 24 hours/day Type of Home: Mobile home Home Access: Stairs to enter Entrance Stairs-Number of Steps: 5 or 6 Entrance Stairs-Rails: Right Home Layout: One level     Bathroom Shower/Tub: Teacher, early years/pre: Standard     Home Equipment: Cane - single point   Additional Comments: uses the cane outside      Prior Functioning/Environment Prior Level of Function : Independent/Modified Independent             Mobility Comments: Uses SPC outside only.  No longer drives as license expired. ADLs Comments: SO does most of the cooking and cleaning.  No assist with ADL/IADL, daughter helps him monitor BP.  Generally takes BP in the am and the pm.        OT Problem List: Impaired balance (sitting and/or standing)      OT Treatment/Interventions: Self-care/ADL training;Therapeutic activities;Balance training    OT Goals(Current goals can be found in the care plan section) Acute Rehab OT Goals Patient Stated Goal: Return home OT Goal Formulation: With patient Time For Goal Achievement: 01/18/21 Potential to Achieve Goals: Good  OT Frequency: Min 2X/week   Barriers to D/C:    none noted       Co-evaluation              AM-PAC OT "6 Clicks" Daily Activity     Outcome Measure Help from another person eating meals?: None Help from another person taking care of personal grooming?: None Help from another person toileting, which includes using toliet, bedpan, or urinal?: A Little Help from another person bathing (including washing, rinsing, drying)?: A Little Help from  another person to put on and taking off regular upper body clothing?: None Help from another person to put on and taking off regular lower body clothing?: A Little 6 Click Score: 21   End of Session    Activity Tolerance: Patient tolerated treatment well Patient left: in bed;with call bell/phone within reach  OT Visit Diagnosis: Unsteadiness on feet (R26.81);Dizziness and giddiness (R42)                Time: 8841-6606 OT Time Calculation (min): 19 min Charges:  OT General Charges $OT Visit: 1 Visit OT Evaluation $OT Eval Moderate Complexity: 1 Mod  01/04/2021  RP, OTR/L  Acute Rehabilitation Services  Office:  [phone]   Metta Clines 01/04/2021, 4:00 PM

## 2021-01-04 NOTE — Progress Notes (Signed)
PROGRESS NOTE    Logan Brown.  KGY:[id number] DOB: 1932-02-03 DOA: 01/03/2021 PCP: [name], MD    Brief Narrative:  This 87 years old male with PMH significant for paroxysmal A. fib not on anticoagulation due to the history of bleeding from the face, hypertension, prediabetes, hyperlipidemia, chronic kidney disease stage IIIa, prostate cancer presented status post syncopal episode.  He reported he was at [facility] shopping, patient wanted to go to the bathroom but he had to stand in line and he started feeling dizzy and lost consciousness.  He lost consciousness for about 2 minutes.  After EMS arrived he was hypotensive and  was given IV fluid bolus and patient was brought in the ED. He still feels slightly dizzy but feeling better. Patient is admitted for syncope.  Assessment & Plan:   Principal Problem:   Syncope Active Problems:   Prediabetes   CKD (chronic kidney disease) stage 3, GFR 30-59 ml/min (HCC)   History of prostate cancer   Chronic diastolic CHF (congestive heart failure) (HCC)  Syncope : Patient was found hypotensive at the site when patient's symptoms happen. Patient does reports episode of orthostatic hypotension in the past. Could be the cause of patient's syncope. Patient does have  history of A. Fib, continue telemetry. Orthostatic hypotension.  Continue IV gentle hydration. Recent Echo LVEF 55-60%  Thrombocytopenia: Platelet count 36 , significantly low platelets. Repeat CBC shows platelet count 123.  History of recent CVA: Patient reported recent stroke about 3 weeks ago when he was in [place]. Records requested.  Patient does have symptoms of dizziness at the time. Continue aspirin and Plavix.  History of paroxysmal A. fib not on anticoagulation due to history of bleeding.: Continue aspirin and Plavix, heart rate is controlled with metoprolol.  EKG shows sinus rhythm.  Hx. of cardiomyopathy: Last LVEF in October 2021 was 55 to  60%. Patient seems euvolemic.  CKD stage IIIa: Serum creatinine at baseline.   Essential hypertension: Continue hydralazine and metoprolol.  Macrocytosis: Continue vitamin B12 supplements.   DVT prophylaxis:  SCDs Code Status: Full code. Family Communication: No family at bed side. Disposition Plan:   Status is: Observation  The patient remains OBS appropriate and will d/c before 2 midnights.   Syncope workup.  Generalized weakness requiring PT and OT eval.  Anticipated dc home in 1-2 days.   Consultants:   None  Procedures:None  Antimicrobials: None   Subjective: Patient was seen and examined at bedside.  Overnight events noted.   Patient reports feeling very weak and tired.  He denies any chest pain.  Objective: Vitals:   01/04/21 1245 01/04/21 1330 01/04/21 1500 01/04/21 1501  BP: (!) 145/70 (!) 145/74 (!) 158/86 (!) 158/86  Pulse: 74 (!) 59 (!) 56 67  Resp: 14 17  17   Temp:    98.8 F (37.1 C)  TempSrc:    Oral  SpO2: 98% 98% 97% 100%  Weight:      Height:        Intake/Output Summary (Last 24 hours) at 01/04/2021 1524 Last data filed at 01/04/2021 1218 Gross per 24 hour  Intake 1740 ml  Output 180 ml  Net 1560 ml   Filed Weights   01/04/21 1144  Weight: 81.6 kg    Examination:  General exam: Appears comfortable, not in any acute distress.  Deconditioned. Respiratory system: Clear to auscultation. Respiratory effort normal. Cardiovascular system: S1-S2 heard, regular rate and rhythm, no murmur. Gastrointestinal system: Abdomen is soft, nontender, nondistended, BS+  Central nervous system: Alert and oriented. No focal neurological deficits. Extremities: No edema, no cyanosis, no clubbing. Skin: No rashes, lesions or ulcers Psychiatry: Judgement and insight appear normal. Mood & affect appropriate.     Data Reviewed: I have personally reviewed following labs and imaging studies  CBC: Recent Labs  Lab 01/03/21 1846 01/04/21 0537   WBC 7.2 5.3  NEUTROABS 5.9 2.8  HGB 13.1 11.0*  HCT 40.2 33.1*  MCV 102.0* 100.3*  PLT 36* 096*   Basic Metabolic Panel: Recent Labs  Lab 01/03/21 1755 01/04/21 0008 01/04/21 0322  NA 137  --  137  K 4.1  --  3.5  CL 108  --  111  CO2 19*  --  21*  GLUCOSE 126*  --  123*  BUN 23  --  20  CREATININE 1.84* 1.47* 1.51*  CALCIUM 8.8*  --  8.0*   GFR: Estimated Creatinine Clearance: 35.2 mL/min (A) (by C-G formula based on SCr of 1.51 mg/dL (H)). Liver Function Tests: Recent Labs  Lab 01/03/21 1755  AST 37  ALT 21  ALKPHOS 111  BILITOT 1.1  PROT 7.0  ALBUMIN 4.0   No results for input(s): LIPASE, AMYLASE in the last 168 hours. No results for input(s): AMMONIA in the last 168 hours. Coagulation Profile: No results for input(s): INR, PROTIME in the last 168 hours. Cardiac Enzymes: No results for input(s): CKTOTAL, CKMB, CKMBINDEX, TROPONINI in the last 168 hours. BNP (last 3 results) No results for input(s): PROBNP in the last 8760 hours. HbA1C: No results for input(s): HGBA1C in the last 72 hours. CBG: Recent Labs  Lab 01/03/21 1753  GLUCAP 114*   Lipid Profile: No results for input(s): CHOL, HDL, LDLCALC, TRIG, CHOLHDL, LDLDIRECT in the last 72 hours. Thyroid Function Tests: No results for input(s): TSH, T4TOTAL, FREET4, T3FREE, THYROIDAB in the last 72 hours. Anemia Panel: Recent Labs    01/04/21 0322 01/04/21 0537  VITAMINB12 754  --   FOLATE 13.1  --   FERRITIN 129  --   TIBC 244*  --   IRON 46  --   RETICCTPCT  --  2.6   Sepsis Labs: No results for input(s): PROCALCITON, LATICACIDVEN in the last 168 hours.  Recent Results (from the past 240 hour(s))  Resp Panel by RT-PCR (Flu A&B, Covid) Nasopharyngeal Swab     Status: None   Collection Time: 01/04/21 12:54 AM   Specimen: Nasopharyngeal Swab; Nasopharyngeal(NP) swabs in vial transport medium  Result Value Ref Range Status   SARS Coronavirus 2 by RT PCR NEGATIVE NEGATIVE Final    Comment:  (NOTE) SARS-CoV-2 target nucleic acids are NOT DETECTED.  The SARS-CoV-2 RNA is generally detectable in upper respiratory specimens during the acute phase of infection. The lowest concentration of SARS-CoV-2 viral copies this assay can detect is 138 copies/mL. A negative result does not preclude SARS-Cov-2 infection and should not be used as the sole basis for treatment or other patient management decisions. A negative result may occur with  improper specimen collection/handling, submission of specimen other than nasopharyngeal swab, presence of viral mutation(s) within the areas targeted by this assay, and inadequate number of viral copies(<138 copies/mL). A negative result must be combined with clinical observations, patient history, and epidemiological information. The expected result is Negative.  Fact Sheet for Patients:  EntrepreneurPulse.com.au  Fact Sheet for Healthcare Providers:  IncredibleEmployment.be  This test is no t yet approved or cleared by the [place] FDA and  has been authorized for detection  and/or diagnosis of SARS-CoV-2 by FDA under an Emergency Use Authorization (EUA). This EUA will remain  in effect (meaning this test can be used) for the duration of the COVID-19 declaration under Section 564(b)(1) of the Act, 21 U.S.C.section 360bbb-3(b)(1), unless the authorization is terminated  or revoked sooner.       Influenza A by PCR NEGATIVE NEGATIVE Final   Influenza B by PCR NEGATIVE NEGATIVE Final    Comment: (NOTE) The Xpert Xpress SARS-CoV-2/FLU/RSV plus assay is intended as an aid in the diagnosis of influenza from Nasopharyngeal swab specimens and should not be used as a sole basis for treatment. Nasal washings and aspirates are unacceptable for Xpert Xpress SARS-CoV-2/FLU/RSV testing.  Fact Sheet for Patients: EntrepreneurPulse.com.au  Fact Sheet for Healthcare  Providers: IncredibleEmployment.be  This test is not yet approved or cleared by the [place] FDA and has been authorized for detection and/or diagnosis of SARS-CoV-2 by FDA under an Emergency Use Authorization (EUA). This EUA will remain in effect (meaning this test can be used) for the duration of the COVID-19 declaration under Section 564(b)(1) of the Act, 21 U.S.C. section 360bbb-3(b)(1), unless the authorization is terminated or revoked.  Performed at [facility], [place] [address] [place], [place] [address]     Radiology Studies: No results found.  Scheduled Meds:  aspirin EC  81 mg Oral Daily   atorvastatin  80 mg Oral QHS   clopidogrel  75 mg Oral q morning   gabapentin  600 mg Oral BID   hydrALAZINE  25 mg Oral TID   losartan  50 mg Oral BID   melatonin  3 mg Oral QHS   metoprolol tartrate  12.5 mg Oral BID   omega-3 acid ethyl esters  1,000 mg Oral Daily   pantoprazole  40 mg Oral Daily   traZODone  50 mg Oral QHS   vitamin B-12  1,000 mcg Oral Daily   Continuous Infusions:  sodium chloride 50 mL/hr at 01/04/21 1359     LOS: 0 days    Time spent: 35 mins    Lilias Lorensen, MD [facility]   If 7PM-7AM, please contact night-coverage

## 2021-01-05 DIAGNOSIS — R55 Syncope and collapse: Secondary | ICD-10-CM | POA: Diagnosis not present

## 2021-01-05 LAB — COMPREHENSIVE METABOLIC PANEL
ALT: 16 U/L (ref 0–44)
AST: 24 U/L (ref 15–41)
Albumin: 3.2 g/dL — ABNORMAL LOW (ref 3.5–5.0)
Alkaline Phosphatase: 88 U/L (ref 38–126)
Anion gap: 5 (ref 5–15)
BUN: 18 mg/dL (ref 8–23)
CO2: 21 mmol/L — ABNORMAL LOW (ref 22–32)
Calcium: 8.3 mg/dL — ABNORMAL LOW (ref 8.9–10.3)
Chloride: 112 mmol/L — ABNORMAL HIGH (ref 98–111)
Creatinine, Ser: 1.51 mg/dL — ABNORMAL HIGH (ref 0.61–1.24)
GFR, Estimated: 44 mL/min — ABNORMAL LOW (ref >60)
Glucose, Bld: 98 mg/dL (ref 70–99)
Potassium: 3.9 mmol/L (ref 3.5–5.1)
Sodium: 138 mmol/L (ref 135–145)
Total Bilirubin: 0.7 mg/dL (ref 0.3–1.2)
Total Protein: 5.5 g/dL — ABNORMAL LOW (ref 6.5–8.1)

## 2021-01-05 LAB — CBC
HCT: 31.3 % — ABNORMAL LOW (ref 39.0–52.0)
Hemoglobin: 10.6 g/dL — ABNORMAL LOW (ref 13.0–17.0)
MCH: 33.8 pg (ref 26.0–34.0)
MCHC: 33.9 g/dL (ref 30.0–36.0)
MCV: 99.7 fL (ref 80.0–100.0)
Platelets: 125 10*3/uL — ABNORMAL LOW (ref 150–400)
RBC: 3.14 MIL/uL — ABNORMAL LOW (ref 4.22–5.81)
RDW: 13.7 % (ref 11.5–15.5)
WBC: 4.3 10*3/uL (ref 4.0–10.5)
nRBC: 0 % (ref 0.0–0.2)

## 2021-01-05 LAB — MAGNESIUM: Magnesium: 2.1 mg/dL (ref 1.7–2.4)

## 2021-01-05 LAB — PHOSPHORUS: Phosphorus: 2.4 mg/dL — ABNORMAL LOW (ref 2.5–4.6)

## 2021-01-05 MED ORDER — METOPROLOL TARTRATE 25 MG PO TABS: 12.5000 mg | ORAL_TABLET | Freq: Two times a day (BID) | ORAL | 1 refills | Status: AC

## 2021-01-05 NOTE — Care Management Obs Status (Signed)
[place] NOTIFICATION   Patient Details  Name: Logan Brown. MRN: [id number] Date of Birth: 1932-02-21   Medicare Observation Status Notification Given:  Yes    Norina Buzzard, RN 01/05/2021, 8:52 AM

## 2021-01-05 NOTE — Discharge Summary (Signed)
Physician Discharge Summary  Logan Brown. BTD:[id number] DOB: 10-06-1931 DOA: 01/03/2021  PCP: [name], MD  Admit date: 01/03/2021  Discharge date: 01/05/2021  Admitted From: Home.  Disposition:  Home.  Recommendations for Outpatient Follow-up:  Follow up with PCP in 1-2 weeks. Please obtain BMP/CBC in one week. Advised to follow-up with cardiology as scheduled.  Home Health: None Equipment/Devices: None  Discharge Condition: Stable CODE STATUS: Full code. Diet recommendation: Heart Healthy   Brief [facility] Course: This 87 years old male with PMH significant for paroxysmal A. fib not on anticoagulation due to the history of bleeding from the face, hypertension, prediabetes, hyperlipidemia, chronic kidney disease stage IIIa, prostate cancer presented status post syncopal episode.  He reported he was at [facility] shopping, patient wanted to go to the bathroom but he had to stand in line and then he started feeling dizzy and lost consciousness.  He lost consciousness for about 2 minutes.  After EMS arrived he was hypotensive and  was given IV fluid bolus and patient was brought in the ED.   Patient was admitted for syncope. Patient was given IV hydration.  Patient does have history of A. fib but heart rate remains controlled.  Blood pressure seems improved.  Patient had a recent echocardiogram shows LVEF 55 to 60%.  Patient does have an appointment with cardiologist.  Renal functions better, Patient feels better, has ambulated in the hallway without any further dizziness.  He wants to be discharged.  PT and OT recommended outpatient PT.  Patient is being discharged home.   He was managed for below problems.   Discharge Diagnoses:  Principal Problem:   Syncope Active Problems:   Prediabetes   CKD (chronic kidney disease) stage 3, GFR 30-59 ml/min (HCC)   History of prostate cancer   Chronic diastolic CHF (congestive heart failure) (HCC)  Syncope : Patient  was found hypotensive at the site when patient's symptoms happen. Patient does reports episode of orthostatic hypotension in the past. Could be the cause of patient's syncope. Patient does have  history of A. Fib, continue telemetry. Orthostatic hypotension.  Continue IV gentle hydration. Recent Echo LVEF 55-60%.   Thrombocytopenia: Platelet count 36 , significantly low platelets. Repeat CBC shows platelet count 123.   History of recent CVA: Patient reported recent stroke about 3 weeks ago when he was in [place]. Records requested.  Patient does have symptoms of dizziness at the time. Continue aspirin and Plavix.   History of paroxysmal A. fib not on anticoagulation due to history of bleeding.: Continue aspirin and Plavix, heart rate is controlled with metoprolol.  EKG shows sinus rhythm.   Hx. of cardiomyopathy: Last LVEF in October 2021 was 55 to 60%. Patient seems euvolemic.   CKD stage IIIa: Serum creatinine at baseline.    Essential hypertension: Continue hydralazine and metoprolol.   Macrocytosis: Continue vitamin B12 supplements.  Discharge Instructions  Discharge Instructions     Call MD for:  difficulty breathing, headache or visual disturbances   Complete by: As directed    Call MD for:  persistant dizziness or light-headedness   Complete by: As directed    Call MD for:  persistant nausea and vomiting   Complete by: As directed    Diet - low sodium heart healthy   Complete by: As directed    Diet Carb Modified   Complete by: As directed    Discharge instructions   Complete by: As directed    Advised to follow-up with primary care  physician in 1 week. Advised to follow-up with cardiology as scheduled.   Increase activity slowly   Complete by: As directed       Allergies as of 01/05/2021       Reactions   Ace Inhibitors Swelling, Other (See Comments)   Angioedema   Hydrocodone Nausea And Vomiting        Medication List     STOP taking  these medications    famotidine 40 MG tablet Commonly known as: PEPCID   labetalol 100 MG tablet Commonly known as: NORMODYNE   oxyCODONE 5 MG immediate release tablet Commonly known as: Oxy IR/ROXICODONE   Xtandi 40 MG capsule Generic drug: enzalutamide       TAKE these medications    acetaminophen 325 MG tablet Commonly known as: TYLENOL Take 650 mg by mouth every 6 (six) hours as needed for moderate pain or headache.   aspirin EC 81 MG tablet Take 81 mg by mouth daily.   atorvastatin 80 MG tablet Commonly known as: LIPITOR Take 80 mg by mouth at bedtime.   CALCIUM 600 + D PO Take 1 tablet by mouth in the morning and at bedtime.   clopidogrel 75 MG tablet Commonly known as: PLAVIX Take 75 mg by mouth every morning.   docusate sodium 100 MG capsule Commonly known as: COLACE Take 100 mg by mouth 2 (two) times daily as needed for mild constipation.   gabapentin 600 MG tablet Commonly known as: NEURONTIN Take  1 tablet  3 x /day  as needed for  Chronic Pain What changed:  how much to take how to take this when to take this additional instructions   hydrALAZINE 25 MG tablet Commonly known as: APRESOLINE Take 25 mg by mouth in the morning and at bedtime. hold if SBP is under 150   losartan 100 MG tablet Commonly known as: COZAAR Take 1 tablet Daily for BP What changed:  how much to take how to take this when to take this additional instructions   melatonin 3 MG Tabs tablet Take 3 mg by mouth at bedtime.   metoprolol tartrate 25 MG tablet Commonly known as: LOPRESSOR Take 0.5 tablets (12.5 mg total) by mouth 2 (two) times daily. What changed: additional instructions   Omega 3 1000 MG Caps Take 1,000 mg by mouth daily.   pantoprazole 40 MG tablet Commonly known as: PROTONIX Take 40 mg by mouth daily.   Pfizer-BioNTech COVID-19 Vacc 30 MCG/0.3ML injection Generic drug: COVID-19 mRNA vaccine (Pfizer) Inject into the muscle.   traZODone 50 MG  tablet Commonly known as: DESYREL Take 50 mg by mouth at bedtime.   vitamin B-12 1000 MCG tablet Commonly known as: CYANOCOBALAMIN Take  1 tablet  Daily What changed:  how much to take how to take this when to take this additional instructions        Follow-up Information     [name], MD Follow up in 1 week(s).   Specialty: Internal Medicine Contact information: [address] [place] [address] [phone]         Minus Breeding, MD Follow up in 1 week(s).   Specialty: Cardiology Contact information: [address] [place] [address] [phone]                Allergies  Allergen Reactions   Ace Inhibitors Swelling and Other (See Comments)    Angioedema   Hydrocodone Nausea And Vomiting    Consultations: None   Procedures/Studies: No  results found.    Subjective: Patient was seen and examined at bedside.  Overnight events noted.  Patient reports feeling better.   Patient has ambulated in the hallway without any dizziness or other symptoms.   Patient wants to be discharged and has appointment cardiologist soon.  Discharge Exam: Vitals:   01/05/21 1026 01/05/21 1128  BP: (!) 151/92 128/68  Pulse: 61 60  Resp:  16  Temp:  97.6 F (36.4 C)  SpO2:     Vitals:   01/05/21 0845 01/05/21 1017 01/05/21 1026 01/05/21 1128  BP:  (!) 151/92 (!) 151/92 128/68  Pulse: 61  61 60  Resp: 14   16  Temp: 98.6 F (37 C)   97.6 F (36.4 C)  TempSrc: Oral   Oral  SpO2:      Weight:      Height:        General: Pt is alert, awake, not in acute distress Cardiovascular: RRR, S1/S2 +, no rubs, no gallops Respiratory: CTA bilaterally, no wheezing, no rhonchi Abdominal: Soft, NT, ND, bowel sounds + Extremities: no edema, no cyanosis    The results of significant diagnostics from this hospitalization (including imaging, microbiology, ancillary and laboratory) are listed below for reference.      Microbiology: Recent Results (from the past 240 hour(s))  Resp Panel by RT-PCR (Flu A&B, Covid) Nasopharyngeal Swab     Status: None   Collection Time: 01/04/21 12:54 AM   Specimen: Nasopharyngeal Swab; Nasopharyngeal(NP) swabs in vial transport medium  Result Value Ref Range Status   SARS Coronavirus 2 by RT PCR NEGATIVE NEGATIVE Final    Comment: (NOTE) SARS-CoV-2 target nucleic acids are NOT DETECTED.  The SARS-CoV-2 RNA is generally detectable in upper respiratory specimens during the acute phase of infection. The lowest concentration of SARS-CoV-2 viral copies this assay can detect is 138 copies/mL. A negative result does not preclude SARS-Cov-2 infection and should not be used as the sole basis for treatment or other patient management decisions. A negative result may occur with  improper specimen collection/handling, submission of specimen other than nasopharyngeal swab, presence of viral mutation(s) within the areas targeted by this assay, and inadequate number of viral copies(<138 copies/mL). A negative result must be combined with clinical observations, patient history, and epidemiological information. The expected result is Negative.  Fact Sheet for Patients:  EntrepreneurPulse.com.au  Fact Sheet for Healthcare Providers:  IncredibleEmployment.be  This test is no t yet approved or cleared by the [place] FDA and  has been authorized for detection and/or diagnosis of SARS-CoV-2 by FDA under an Emergency Use Authorization (EUA). This EUA will remain  in effect (meaning this test can be used) for the duration of the COVID-19 declaration under Section 564(b)(1) of the Act, 21 U.S.C.section 360bbb-3(b)(1), unless the authorization is terminated  or revoked sooner.       Influenza A by PCR NEGATIVE NEGATIVE Final   Influenza B by PCR NEGATIVE NEGATIVE Final    Comment: (NOTE) The Xpert Xpress SARS-CoV-2/FLU/RSV plus assay is  intended as an aid in the diagnosis of influenza from Nasopharyngeal swab specimens and should not be used as a sole basis for treatment. Nasal washings and aspirates are unacceptable for Xpert Xpress SARS-CoV-2/FLU/RSV testing.  Fact Sheet for Patients: EntrepreneurPulse.com.au  Fact Sheet for Healthcare Providers: IncredibleEmployment.be  This test is not yet approved or cleared by the [place] FDA and has been authorized for detection and/or diagnosis of SARS-CoV-2 by FDA under an Emergency Use Authorization (EUA). This  EUA will remain in effect (meaning this test can be used) for the duration of the COVID-19 declaration under Section 564(b)(1) of the Act, 21 U.S.C. section 360bbb-3(b)(1), unless the authorization is terminated or revoked.  Performed at [facility], [place] [address] [place], [place] [address]      Labs: BNP (last 3 results) No results for input(s): BNP in the last 8760 hours. Basic Metabolic Panel: Recent Labs  Lab 01/03/21 1755 01/04/21 0008 01/04/21 0322 01/05/21 0228  NA 137  --  137 138  K 4.1  --  3.5 3.9  CL 108  --  111 112*  CO2 19*  --  21* 21*  GLUCOSE 126*  --  123* 98  BUN 23  --  20 18  CREATININE 1.84* 1.47* 1.51* 1.51*  CALCIUM 8.8*  --  8.0* 8.3*  MG  --   --   --  2.1  PHOS  --   --   --  2.4*   Liver Function Tests: Recent Labs  Lab 01/03/21 1755 01/05/21 0228  AST 37 24  ALT 21 16  ALKPHOS 111 88  BILITOT 1.1 0.7  PROT 7.0 5.5*  ALBUMIN 4.0 3.2*   No results for input(s): LIPASE, AMYLASE in the last 168 hours. No results for input(s): AMMONIA in the last 168 hours. CBC: Recent Labs  Lab 01/03/21 1846 01/04/21 0537 01/05/21 0228  WBC 7.2 5.3 4.3  NEUTROABS 5.9 2.8  --   HGB 13.1 11.0* 10.6*  HCT 40.2 33.1* 31.3*  MCV 102.0* 100.3* 99.7  PLT 36* 123* 125*   Cardiac Enzymes: No results for input(s): CKTOTAL, CKMB, CKMBINDEX, TROPONINI in the last 168  hours. BNP: Invalid input(s): POCBNP CBG: Recent Labs  Lab 01/03/21 1753  GLUCAP 114*   D-Dimer No results for input(s): DDIMER in the last 72 hours. Hgb A1c No results for input(s): HGBA1C in the last 72 hours. Lipid Profile No results for input(s): CHOL, HDL, LDLCALC, TRIG, CHOLHDL, LDLDIRECT in the last 72 hours. Thyroid function studies No results for input(s): TSH, T4TOTAL, T3FREE, THYROIDAB in the last 72 hours.  Invalid input(s): FREET3 Anemia work up Recent Labs    01/04/21 0322 01/04/21 0537  VITAMINB12 754  --   FOLATE 13.1  --   FERRITIN 129  --   TIBC 244*  --   IRON 46  --   RETICCTPCT  --  2.6   Urinalysis    Component Value Date/Time   COLORURINE YELLOW 01/03/2021 1722   APPEARANCEUR CLEAR 01/03/2021 1722   LABSPEC 1.017 01/03/2021 1722   PHURINE 6.0 01/03/2021 1722   GLUCOSEU NEGATIVE 01/03/2021 1722   HGBUR NEGATIVE 01/03/2021 1722   BILIRUBINUR NEGATIVE 01/03/2021 1722   KETONESUR 20 (A) 01/03/2021 1722   PROTEINUR NEGATIVE 01/03/2021 1722   UROBILINOGEN 1.0 04/02/2009 1921   NITRITE NEGATIVE 01/03/2021 1722   LEUKOCYTESUR TRACE (A) 01/03/2021 1722   Sepsis Labs Invalid input(s): PROCALCITONIN,  WBC,  LACTICIDVEN Microbiology Recent Results (from the past 240 hour(s))  Resp Panel by RT-PCR (Flu A&B, Covid) Nasopharyngeal Swab     Status: None   Collection Time: 01/04/21 12:54 AM   Specimen: Nasopharyngeal Swab; Nasopharyngeal(NP) swabs in vial transport medium  Result Value Ref Range Status   SARS Coronavirus 2 by RT PCR NEGATIVE NEGATIVE Final    Comment: (NOTE) SARS-CoV-2 target nucleic acids are NOT DETECTED.  The SARS-CoV-2 RNA is generally detectable in upper respiratory specimens during the acute phase of infection. The lowest concentration  of SARS-CoV-2 viral copies this assay can detect is 138 copies/mL. A negative result does not preclude SARS-Cov-2 infection and should not be used as the sole basis for treatment or other  patient management decisions. A negative result may occur with  improper specimen collection/handling, submission of specimen other than nasopharyngeal swab, presence of viral mutation(s) within the areas targeted by this assay, and inadequate number of viral copies(<138 copies/mL). A negative result must be combined with clinical observations, patient history, and epidemiological information. The expected result is Negative.  Fact Sheet for Patients:  EntrepreneurPulse.com.au  Fact Sheet for Healthcare Providers:  IncredibleEmployment.be  This test is no t yet approved or cleared by the [place] FDA and  has been authorized for detection and/or diagnosis of SARS-CoV-2 by FDA under an Emergency Use Authorization (EUA). This EUA will remain  in effect (meaning this test can be used) for the duration of the COVID-19 declaration under Section 564(b)(1) of the Act, 21 U.S.C.section 360bbb-3(b)(1), unless the authorization is terminated  or revoked sooner.       Influenza A by PCR NEGATIVE NEGATIVE Final   Influenza B by PCR NEGATIVE NEGATIVE Final    Comment: (NOTE) The Xpert Xpress SARS-CoV-2/FLU/RSV plus assay is intended as an aid in the diagnosis of influenza from Nasopharyngeal swab specimens and should not be used as a sole basis for treatment. Nasal washings and aspirates are unacceptable for Xpert Xpress SARS-CoV-2/FLU/RSV testing.  Fact Sheet for Patients: EntrepreneurPulse.com.au  Fact Sheet for Healthcare Providers: IncredibleEmployment.be  This test is not yet approved or cleared by the [place] FDA and has been authorized for detection and/or diagnosis of SARS-CoV-2 by FDA under an Emergency Use Authorization (EUA). This EUA will remain in effect (meaning this test can be used) for the duration of the COVID-19 declaration under Section 564(b)(1) of the Act, 21 U.S.C. section  360bbb-3(b)(1), unless the authorization is terminated or revoked.  Performed at [facility], [place] [address], [place], [place] [address]      Time coordinating discharge: Over 30 minutes  SIGNED:   Shawna Clamp, MD  [facility] 01/05/2021, 4:05 PM Pager   If 7PM-7AM, please contact night-coverage

## 2021-01-05 NOTE — Discharge Instructions (Signed)
Advised to follow-up with primary care physician in 1 week. Advised to follow-up with cardiology as scheduled.

## 2021-01-05 NOTE — Evaluation (Addendum)
Physical Therapy Evaluation Patient Details Name: Logan Brown. MRN: [id number] DOB: [birth year] Today's Date: 01/05/2021  History of Present Illness  The pt is an 87 yo male presenting 10/27 after a syncopal episode. PMH includes: arthritis, afib, CKD, HLD, HTN, prostate cancer with bone mets, and cardiomyopathy.   Clinical Impression  Pt in bed upon arrival of PT, agreeable to evaluation at this time. Prior to admission the pt was independent with use of SPC for community mobility, living in a home with 5-6 steps to enter with no recent falls. The pt now presents with limitations in functional mobility and dynamic stability, but is close to his mobility baseline and is likely safe to return home with family when medically cleared. The pt was able to complete multiple transfers without need for UE support or assist at this time, and completed hallway ambulation without assist and with improved stability with BUE support. The pt will continue to benefit from skilled PT acutely to progress dynamic stability and continue fall reduction education, but no follow up therapies indicated at this time as pt is mobilizing on supervision level with appropriate DME.        Recommendations for follow up therapy are one component of a multi-disciplinary discharge planning process, led by the attending physician.  Recommendations may be updated based on patient status, additional functional criteria and insurance authorization.  Follow Up Recommendations No PT follow up    Assistance Recommended at Discharge Intermittent Supervision/Assistance  Functional Status Assessment Patient has had a recent decline in their functional status and demonstrates the ability to make significant improvements in function in a reasonable and predictable amount of time.  Equipment Recommendations  Rollator (4 wheels)    Recommendations for Other Services       Precautions / Restrictions Precautions Precautions:  Fall Precaution Comments: +orthostatics on prior admission Restrictions Weight Bearing Restrictions: No      Mobility  Bed Mobility Overal bed mobility: Needs Assistance Bed Mobility: Supine to Sit     Supine to sit: Supervision     General bed mobility comments: supervision, slightly increased time    Transfers Overall transfer level: Needs assistance Equipment used: None Transfers: Sit to/from Stand Sit to Stand: Min guard           General transfer comment: minG with RW and no AD.    Ambulation/Gait Ambulation/Gait assistance: Min guard Gait Distance (Feet): 250 Feet Assistive device: Rolling walker (2 wheels);IV Pole Gait Pattern/deviations: Step-through pattern;Decreased stride length;Trunk flexed Gait velocity: 0.57 m/s Gait velocity interpretation: 1.31 - 2.62 ft/sec, indicative of limited community ambulator General Gait Details: pt with improved stability with use of RW, but demos slight trunk flexion and slowed gait. assist to manage IV pole with ambulation with only single UE support  Stairs Stairs: Yes Stairs assistance: Supervision Stair Management: One rail Left;Step to pattern;Forwards Number of Stairs: 4 General stair comments: no LOB, but benefits from single UE support      Balance Overall balance assessment: Needs assistance Sitting-balance support: Feet supported Sitting balance-Leahy Scale: Good     Standing balance support: Reliant on assistive device for balance Standing balance-Leahy Scale: Fair Standing balance comment: improved stability with BUE support                             Pertinent Vitals/Pain Pain Assessment: No/denies pain    Home Living Family/patient expects to be discharged to:: Private residence Living Arrangements: Spouse/significant other  Available Help at Discharge: Family;Available 24 hours/day Type of Home: Mobile home Home Access: Stairs to enter Entrance Stairs-Rails: Right Entrance  Stairs-Number of Steps: 5 or 6   Home Layout: One level Home Equipment: Cane - single point Additional Comments: uses the cane outside    Prior Function Prior Level of Function : Independent/Modified Independent             Mobility Comments: Uses SPC outside only.  No longer drives as license expired. ADLs Comments: SO does most of the cooking and cleaning.  No assist with ADL/IADL, daughter helps him monitor BP.  Generally takes BP in the am and the pm.     Hand Dominance   Dominant Hand: Right    Extremity/Trunk Assessment   Upper Extremity Assessment Upper Extremity Assessment: Overall WFL for tasks assessed    Lower Extremity Assessment Lower Extremity Assessment: Overall WFL for tasks assessed    Cervical / Trunk Assessment Cervical / Trunk Assessment: Kyphotic  Communication   Communication: No difficulties;HOH  Cognition Arousal/Alertness: Awake/alert Behavior During Therapy: WFL for tasks assessed/performed Overall Cognitive Status: Within Functional Limits for tasks assessed                                 General Comments: pt able to answer all questions appropriately, follow all instructions, and demos good safety awareness        General Comments General comments (skin integrity, edema, etc.): BP elevated not orthostatic at time of eval, HR from 50s to 90s with ambulation    Exercises     Assessment/Plan    PT Assessment Patient needs continued PT services  PT Problem List Decreased strength;Decreased activity tolerance;Decreased balance;Decreased mobility       PT Treatment Interventions DME instruction;Gait training;Stair training;Functional mobility training;Therapeutic activities;Therapeutic exercise;Balance training;Patient/family education    PT Goals (Current goals can be found in the Care Plan section)  Acute Rehab PT Goals Patient Stated Goal: return home PT Goal Formulation: With patient Time For Goal Achievement:  01/19/21 Potential to Achieve Goals: Good    Frequency Min 3X/week    AM-PAC PT "6 Clicks" Mobility  Outcome Measure Help needed turning from your back to your side while in a flat bed without using bedrails?: None Help needed moving from lying on your back to sitting on the side of a flat bed without using bedrails?: None Help needed moving to and from a bed to a chair (including a wheelchair)?: A Little Help needed standing up from a chair using your arms (e.g., wheelchair or bedside chair)?: A Little Help needed to walk in hospital room?: A Little Help needed climbing 3-5 steps with a railing? : A Little 6 Click Score: 20    End of Session Equipment Utilized During Treatment: Gait belt Activity Tolerance: Patient tolerated treatment well Patient left: in chair;with call bell/phone within reach (pt in bathroom with call bell) Nurse Communication: Mobility status PT Visit Diagnosis: Unsteadiness on feet (R26.81);Other abnormalities of gait and mobility (R26.89)    Time: 8250-5397 PT Time Calculation (min) (ACUTE ONLY): 30 min   Charges:   PT Evaluation $PT Eval Low Complexity: 1 Low PT Treatments $Gait Training: 8-22 mins        West Carbo, PT, DPT   Acute Rehabilitation Department Pager #: [phone]  Sandra Cockayne 01/05/2021, 9:37 AM

## 2021-01-06 ENCOUNTER — Other Ambulatory Visit (HOSPITAL_BASED_OUTPATIENT_CLINIC_OR_DEPARTMENT_OTHER)

## 2021-01-08 ENCOUNTER — Telehealth: Payer: Self-pay

## 2021-01-08 NOTE — Telephone Encounter (Signed)
Oral Oncology Patient Advocate Encounter  Met patient in lobby room to complete re-enrollment application for [employer] in an effort to reduce patient's out of pocket expense for Xtandi to $0.    Application completed and faxed to [phone].   Xtandi patient assistance phone number for follow up is [phone].   This encounter will be updated until final determination.   [place] Patient [place] Phone [phone] Fax [phone] 01/08/2021 4:35 PM

## 2021-01-09 ENCOUNTER — Ambulatory Visit: Payer: MEDICARE | Primary: Family Medicine

## 2021-01-11 ENCOUNTER — Telehealth: Payer: Self-pay

## 2021-01-11 NOTE — Telephone Encounter (Signed)
Left message on voicemail to call the office for a Hospital Follow up.

## 2021-01-14 NOTE — Telephone Encounter (Signed)
Patient is approved for Xtandi at no cost from [employer] 03/10/21-12/31-23  Logan Brown uses [name] [name] Phone [phone] Fax [phone] 01/14/2021 9:06 AM

## 2021-01-15 NOTE — Telephone Encounter (Signed)
Hospital follow up scheduled

## 2021-01-17 ENCOUNTER — Ambulatory Visit: Payer: Medicare HMO | Admitting: Internal Medicine

## 2021-01-17 NOTE — Progress Notes (Addendum)
C  A  N  C  E  L  L  E  D  P  O  S  T   -   H  O  S  P  I  T  A  L  F  O  L  L  O  W   -  U  P                                                                                                                                                                                                                                                             This very nice 87 y.o. DWM was admitted to the hospital on   01/03/2021 and patient was discharged from the hospital 12 days ago on  01/05/2021. The patient now presents for follow up for transition from recent hospitalization .  The day after discharge  our clinical staff contacted the patient to assure stability and schedule a follow up appointment. The discharge summary, medications and diagnostic test results were reviewed before meeting with the patient. The patient was admitted for:    Syncope, unspecified syncope type - Plan: CBC with Differential/Platelet, COMPLETE METABOLIC PANEL WITH GFR Orthostatic hypotension - Plan: CBC with Differential/Platelet, COMPLETE METABOLIC PANEL WITH GFR Dehydration - Plan: COMPLETE METABOLIC PANEL WITH GFR Paroxysmal atrial fibrillation (HCC) - Plan: COMPLETE METABOLIC PANEL WITH GFR Thrombocytopenia (HCC) - Plan: CBC with Differential/Platelet Recent cerebrovascular accident (CVA) Cardiomyopathy, unspecified type ([place]) Stage 3a chronic kidney disease ([place]) - Plan: COMPLETE METABOLIC PANEL WITH GFR Essential hypertension - Plan: CBC with Differential/Platelet, COMPLETE METABOLIC PANEL WITH GFR 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 Patient was admitted for IVF rehydration, telimetry monitoring. No arrhythmoias were observed and MI /CVA were ruled out         Hospitalization discharge instructions and medications are reconciled with the patient.      Patient is also followed with Hypertension, Hyperlipidemia, Pre-Diabetes and Vitamin D Deficiency.      Patient is treated for HTN & BP has been controlled at home. Today's  .  Patient has had no complaints of any cardiac type chest pain, palpitations, dyspnea/orthopnea/PND, dizziness, claudication, or dependent edema.     Hyperlipidemia is controlled with diet & meds. Patient denies myalgias or other med SE's. Last Lipids were  Lab Results  Component Value Date   CHOL 235 (H) 04/27/2019   HDL 43 04/27/2019   LDLCALC not calculated 04/27/2019   TRIG 487 (H) 04/27/2019   CHOLHDL 5.5 (H) 04/27/2019      Also, the patient has history of PreDiabetes and has had no symptoms of reactive hypoglycemia, diabetic polys, paresthesias or visual blurring.  Last A1c was   Lab Results  Component Value Date   HGBA1C 5.9 (H) 05/29/2020      Further, the patient also has history of Vitamin D Deficiency and supplements vitamin D without any suspected side-effects. Last vitamin D was  low  (goal 70-100) :  Lab Results  Component Value Date   VD25OH 40 05/29/2020

## 2021-01-22 ENCOUNTER — Encounter (HOSPITAL_BASED_OUTPATIENT_CLINIC_OR_DEPARTMENT_OTHER): Admitting: Internal Medicine

## 2021-01-22 ENCOUNTER — Encounter: Payer: MEDICARE | Primary: Family Medicine

## 2021-01-23 ENCOUNTER — Ambulatory Visit: Payer: MEDICARE | Attending: Acute Care | Primary: Family Medicine

## 2021-01-23 ENCOUNTER — Encounter: Payer: MEDICARE | Primary: Family Medicine

## 2021-01-23 NOTE — Progress Notes (Addendum)
NO SHOW

## 2021-01-24 ENCOUNTER — Ambulatory Visit (INDEPENDENT_AMBULATORY_CARE_PROVIDER_SITE_OTHER): Payer: Self-pay | Admitting: Internal Medicine

## 2021-01-24 DIAGNOSIS — C61 Malignant neoplasm of prostate: Secondary | ICD-10-CM

## 2021-01-24 DIAGNOSIS — Z91199 Patient's noncompliance with other medical treatment and regimen due to unspecified reason: Secondary | ICD-10-CM

## 2021-01-24 DIAGNOSIS — Z79899 Other long term (current) drug therapy: Secondary | ICD-10-CM

## 2021-01-24 DIAGNOSIS — M545 Low back pain, unspecified: Secondary | ICD-10-CM

## 2021-01-27 ENCOUNTER — Other Ambulatory Visit: Payer: Self-pay | Admitting: Cardiology

## 2021-01-27 ENCOUNTER — Other Ambulatory Visit: Payer: Self-pay | Admitting: Internal Medicine

## 2021-01-27 DIAGNOSIS — I10 Essential (primary) hypertension: Secondary | ICD-10-CM

## 2021-01-27 DIAGNOSIS — I480 Paroxysmal atrial fibrillation: Secondary | ICD-10-CM

## 2021-01-27 DIAGNOSIS — K219 Gastro-esophageal reflux disease without esophagitis: Secondary | ICD-10-CM

## 2021-01-29 ENCOUNTER — Ambulatory Visit (INDEPENDENT_AMBULATORY_CARE_PROVIDER_SITE_OTHER): Payer: Self-pay | Admitting: Internal Medicine

## 2021-01-29 ENCOUNTER — Other Ambulatory Visit (HOSPITAL_BASED_OUTPATIENT_CLINIC_OR_DEPARTMENT_OTHER)

## 2021-01-29 DIAGNOSIS — R69 Illness, unspecified: Secondary | ICD-10-CM

## 2021-01-29 DIAGNOSIS — E559 Vitamin D deficiency, unspecified: Secondary | ICD-10-CM

## 2021-01-29 DIAGNOSIS — R55 Syncope and collapse: Secondary | ICD-10-CM

## 2021-01-29 DIAGNOSIS — I480 Paroxysmal atrial fibrillation: Secondary | ICD-10-CM

## 2021-01-29 DIAGNOSIS — E782 Mixed hyperlipidemia: Secondary | ICD-10-CM

## 2021-01-29 DIAGNOSIS — N1831 Chronic kidney disease, stage 3a: Secondary | ICD-10-CM

## 2021-01-29 DIAGNOSIS — I951 Orthostatic hypotension: Secondary | ICD-10-CM

## 2021-01-29 NOTE — Progress Notes (Addendum)
  3rd Cancellation   Day of Appt in a Row

## 2021-01-30 ENCOUNTER — Telehealth: Payer: Self-pay | Admitting: Internal Medicine

## 2021-01-30 NOTE — Chronic Care Management (AMB) (Signed)
  Chronic Care Management   Outreach Note  01/30/2021 Name: Logan Brown. MRN: [id number] DOB: 03/06/1932  Referred by: [name], MD Reason for referral : No chief complaint on file.   An unsuccessful telephone outreach was attempted today. The patient was referred to the pharmacist for assistance with care management and care coordination.   Follow Up Plan:   Tatjana Dellinger Upstream Scheduler

## 2021-02-04 ENCOUNTER — Emergency Department (HOSPITAL_COMMUNITY): Payer: Medicare HMO

## 2021-02-04 ENCOUNTER — Emergency Department (HOSPITAL_COMMUNITY)
Admission: EM | Admit: 2021-02-04 | Discharge: 2021-02-05 | Disposition: A | Discharge status: Home / Self Care | Payer: Medicare HMO | Attending: Emergency Medicine | Admitting: Emergency Medicine

## 2021-02-04 ENCOUNTER — Other Ambulatory Visit: Payer: Self-pay

## 2021-02-04 ENCOUNTER — Encounter (HOSPITAL_COMMUNITY): Payer: Self-pay

## 2021-02-04 DIAGNOSIS — R1111 Vomiting without nausea: Secondary | ICD-10-CM | POA: Diagnosis not present

## 2021-02-04 DIAGNOSIS — Z85830 Personal history of malignant neoplasm of bone: Secondary | ICD-10-CM | POA: Insufficient documentation

## 2021-02-04 DIAGNOSIS — Z87891 Personal history of nicotine dependence: Secondary | ICD-10-CM | POA: Insufficient documentation

## 2021-02-04 DIAGNOSIS — Z20822 Contact with and (suspected) exposure to covid-19: Secondary | ICD-10-CM | POA: Insufficient documentation

## 2021-02-04 DIAGNOSIS — I130 Hypertensive heart and chronic kidney disease with heart failure and stage 1 through stage 4 chronic kidney disease, or unspecified chronic kidney disease: Secondary | ICD-10-CM | POA: Diagnosis not present

## 2021-02-04 DIAGNOSIS — Z8546 Personal history of malignant neoplasm of prostate: Secondary | ICD-10-CM | POA: Diagnosis not present

## 2021-02-04 DIAGNOSIS — Z7902 Long term (current) use of antithrombotics/antiplatelets: Secondary | ICD-10-CM | POA: Diagnosis not present

## 2021-02-04 DIAGNOSIS — R1110 Vomiting, unspecified: Secondary | ICD-10-CM | POA: Diagnosis not present

## 2021-02-04 DIAGNOSIS — N183 Chronic kidney disease, stage 3 unspecified: Secondary | ICD-10-CM | POA: Insufficient documentation

## 2021-02-04 DIAGNOSIS — R112 Nausea with vomiting, unspecified: Secondary | ICD-10-CM | POA: Insufficient documentation

## 2021-02-04 DIAGNOSIS — R1084 Generalized abdominal pain: Secondary | ICD-10-CM | POA: Insufficient documentation

## 2021-02-04 DIAGNOSIS — I5032 Chronic diastolic (congestive) heart failure: Secondary | ICD-10-CM | POA: Diagnosis not present

## 2021-02-04 DIAGNOSIS — R197 Diarrhea, unspecified: Secondary | ICD-10-CM | POA: Insufficient documentation

## 2021-02-04 DIAGNOSIS — Z7982 Long term (current) use of aspirin: Secondary | ICD-10-CM | POA: Insufficient documentation

## 2021-02-04 DIAGNOSIS — Z79899 Other long term (current) drug therapy: Secondary | ICD-10-CM | POA: Diagnosis not present

## 2021-02-04 DIAGNOSIS — R109 Unspecified abdominal pain: Secondary | ICD-10-CM | POA: Diagnosis not present

## 2021-02-04 DIAGNOSIS — I10 Essential (primary) hypertension: Secondary | ICD-10-CM | POA: Diagnosis not present

## 2021-02-04 DIAGNOSIS — M549 Dorsalgia, unspecified: Secondary | ICD-10-CM | POA: Diagnosis not present

## 2021-02-04 DIAGNOSIS — R001 Bradycardia, unspecified: Secondary | ICD-10-CM | POA: Diagnosis not present

## 2021-02-04 LAB — CBC WITH DIFFERENTIAL/PLATELET
Abs Immature Granulocytes: 0.01 10*3/uL (ref 0.00–0.07)
Basophils Absolute: 0 10*3/uL (ref 0.0–0.1)
Basophils Relative: 1 %
Eosinophils Absolute: 0.1 10*3/uL (ref 0.0–0.5)
Eosinophils Relative: 2 %
HCT: 37.2 % — ABNORMAL LOW (ref 39.0–52.0)
Hemoglobin: 12.5 g/dL — ABNORMAL LOW (ref 13.0–17.0)
Immature Granulocytes: 0 %
Lymphocytes Relative: 31 %
Lymphs Abs: 1.7 10*3/uL (ref 0.7–4.0)
MCH: 33.7 pg (ref 26.0–34.0)
MCHC: 33.6 g/dL (ref 30.0–36.0)
MCV: 100.3 fL — ABNORMAL HIGH (ref 80.0–100.0)
Monocytes Absolute: 0.5 10*3/uL (ref 0.1–1.0)
Monocytes Relative: 9 %
Neutro Abs: 3.2 10*3/uL (ref 1.7–7.7)
Neutrophils Relative %: 57 %
Platelets: 167 10*3/uL (ref 150–400)
RBC: 3.71 MIL/uL — ABNORMAL LOW (ref 4.22–5.81)
RDW: 14.1 % (ref 11.5–15.5)
WBC: 5.6 10*3/uL (ref 4.0–10.5)
nRBC: 0 % (ref 0.0–0.2)

## 2021-02-04 LAB — COMPREHENSIVE METABOLIC PANEL
ALT: 15 U/L (ref 0–44)
AST: 20 U/L (ref 15–41)
Albumin: 4.2 g/dL (ref 3.5–5.0)
Alkaline Phosphatase: 197 U/L — ABNORMAL HIGH (ref 38–126)
Anion gap: 6 (ref 5–15)
BUN: 18 mg/dL (ref 8–23)
CO2: 23 mmol/L (ref 22–32)
Calcium: 9.1 mg/dL (ref 8.9–10.3)
Chloride: 109 mmol/L (ref 98–111)
Creatinine, Ser: 1.09 mg/dL (ref 0.61–1.24)
GFR, Estimated: >60 mL/min (ref >60)
Glucose, Bld: 90 mg/dL (ref 70–99)
Potassium: 4.4 mmol/L (ref 3.5–5.1)
Sodium: 138 mmol/L (ref 135–145)
Total Bilirubin: 0.9 mg/dL (ref 0.3–1.2)
Total Protein: 7.6 g/dL (ref 6.5–8.1)

## 2021-02-04 LAB — LIPASE, BLOOD: Lipase: 30 U/L (ref 11–51)

## 2021-02-04 LAB — URINALYSIS, ROUTINE W REFLEX MICROSCOPIC
Bilirubin Urine: NEGATIVE
Glucose, UA: NEGATIVE mg/dL
Hgb urine dipstick: NEGATIVE
Ketones, ur: NEGATIVE mg/dL
Leukocytes,Ua: NEGATIVE
Nitrite: NEGATIVE
Protein, ur: NEGATIVE mg/dL
Specific Gravity, Urine: 1.019 (ref 1.005–1.030)
pH: 5 (ref 5.0–8.0)

## 2021-02-04 LAB — RESP PANEL BY RT-PCR (FLU A&B, COVID) ARPGX2
Influenza A by PCR: NEGATIVE
Influenza B by PCR: NEGATIVE
SARS Coronavirus 2 by RT PCR: NEGATIVE

## 2021-02-04 MED ORDER — SODIUM CHLORIDE 0.9 % IV BOLUS
500.0000 mL | Freq: Once | INTRAVENOUS | Status: AC
  Administered 2021-02-04: 18:00:00 500 mL via INTRAVENOUS

## 2021-02-04 MED ORDER — SODIUM CHLORIDE 0.9 % IV SOLN: INTRAVENOUS | Status: DC

## 2021-02-04 MED ORDER — IOHEXOL 350 MG/ML SOLN
80.0000 mL | Freq: Once | INTRAVENOUS | Status: AC | PRN
  Administered 2021-02-04: 19:00:00 80 mL via INTRAVENOUS

## 2021-02-04 MED ORDER — LOPERAMIDE HCL 2 MG PO TABS: 2.0000 mg | ORAL_TABLET | Freq: Four times a day (QID) | ORAL | 0 refills | Status: AC | PRN

## 2021-02-04 NOTE — Discharge Instructions (Signed)
It seems as if your diarrhea is improving.  Prescription provided for Imodium A-D if you have recurrence.  Return for any new or worse symptoms.  Today's work-up to include CT scan abdomen and pelvis without any acute findings.  As well as your COVID and influenza testing was negative.

## 2021-02-04 NOTE — ED Notes (Signed)
[facility] contacted, transport arranged for patient back home.

## 2021-02-04 NOTE — ED Triage Notes (Signed)
EMS reports from home, diarrhea x 2 days, clear liquid, generalized abdominal pain. Pt states not eating well.  BP 168/94 HR 60 RR 22 Sp02 99 RA CBG 97 Temp 97.7

## 2021-02-04 NOTE — ED Provider Notes (Signed)
[facility] DEPT Provider Note   CSN: [id number] Arrival date & time: 02/04/21  1538     History Chief Complaint  Patient presents with   Abdominal Pain   Diarrhea    Logan Brown. is a 87 y.o. male.  Patient with a complaint of diarrhea for 2 days.  It is clear liquid generalized abdominal pain 1 episode of vomiting this morning.  Has had nausea.  Not eating well.  Patient brought in by EMS.  Brought in from home.   Past medical history is significant for hyperlipidemia prostate cancer atrial fibrillation cardiomyopathy hypertension and chronic kidney disease.      Past Medical History:  Diagnosis Date   Arthritis    Atrial fibrillation ([place])    Bone cancer ([place])    Cardiomyopathy ([place])    Chronic kidney disease    Hyperlipidemia    Hypertension    Prostate cancer ([place]) 1997   Unstable gait 04/16/2018   unstable gait in home    Patient Active Problem List   Diagnosis Date Noted   Syncope 01/03/2021   Chronic use of opiate for therapeutic purpose 60/63/0160   Uncomplicated opioid dependence ([place]) 05/31/2020   Spine metastasis ([place]) 01/17/2020   Multilevel spine pain 01/17/2020   At maximum risk for fall 01/16/2020   Chronic diastolic CHF (congestive heart failure) ([place]) 01/10/2019   Bradycardia by electrocardiogram 05/13/2018   Orthostatic hypotension 05/13/2018   Renal insufficiency 04/05/2018   Cancer-related pain 04/05/2018   GERD (gastroesophageal reflux disease) 04/05/2018   Neurogenic pain 04/05/2018   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 ([place]) 04/05/2018   History of prostate cancer 04/05/2018   Osteopenia of spine 04/05/2018   Osteopenia determined by x-ray 04/05/2018   Osteoarthritis of facet joint of lumbar spine 04/05/2018   Osteoarthritis involving multiple joints 04/05/2018   DDD (degenerative disc disease), lumbar 04/05/2018   Osteoarthritis of hip (Bilateral) 04/05/2018   Abnormal MRI, lumbar  spine 04/05/2018   Lumbar central spinal stenosis, w/o neurogenic claudication 04/05/2018   Lumbar foraminal stenosis (Bilateral: L4-5) (Right: L1-2, L5-S1) (Left: L2-3, L3-4) 04/05/2018   Lumbar facet hypertrophy 04/05/2018   Lumbar facet joint syndrome (Bilateral) 04/05/2018   Metastatic cancer to spine ([place]) 04/05/2018   History of kyphoplasty (L2) 04/05/2018   Low vitamin B12 level 03/30/2018   Chronic pain syndrome 03/23/2018   Long term current use of opiate analgesic 03/23/2018   Long term prescription benzodiazepine use 03/23/2018   Pharmacologic therapy 03/23/2018   Disorder of skeletal system 03/23/2018   Problems influencing health status 03/23/2018   Chronic low back pain (1ry area of Pain) (Bilateral) (R>L) w/o sciatica 03/23/2018   Compression fracture of L2 lumbar vertebra, sequela 01/13/2018   Scrotal itching 01/13/2018   Anxiety 01/13/2018   Prostate cancer ([place]) 01/12/2018   Hypocalcemia 01/12/2018   Overweight (BMI 25.0-29.9) 10/16/2017   Nonischemic cardiomyopathy ([place]) 03/27/2017   Atrial fibrillation ([place]) 03/27/2017   CKD (chronic kidney disease) stage 3, GFR 30-59 ml/min ([place]) 10/16/2016   Depression, major, in remission ([place]) 03/24/2016   HTN (hypertension) 03/01/2015   Mixed hyperlipidemia 03/01/2015   Prediabetes 03/01/2015   Vitamin D deficiency 03/01/2015   Medication management 03/01/2015    Past Surgical History:  Procedure Laterality Date   CATARACT EXTRACTION     EYE SURGERY Bilateral    IOL/CE on Lt in 1998 and Rt in 2011.   PROSTATE SURGERY     WRIST FOREIGN BODY REMOVAL  1957 glass       History reviewed. No pertinent family history.  Social History   Tobacco Use   Smoking status: Former    Types: Cigarettes    Quit date: 03/14/1975    Years since quitting: 45.9   Smokeless tobacco: Never  Vaping Use   Vaping Use: Never used  Substance Use Topics   Alcohol use: No    Alcohol/week: 0.0 standard drinks    Comment: occ    Drug use: No    Home Medications Prior to Admission medications   Medication Sig Start Date End Date Taking? Authorizing Provider  acetaminophen (TYLENOL) 325 MG tablet Take 650 mg by mouth every 6 (six) hours as needed for moderate pain or headache.    [provider]  aspirin EC 81 MG tablet Take 81 mg by mouth daily.    [provider]  atorvastatin (LIPITOR) 80 MG tablet Take 80 mg by mouth at bedtime. 12/30/20   [provider]  Calcium Carb-Cholecalciferol (CALCIUM 600 + D PO) Take 1 tablet by mouth in the morning and at bedtime.    [provider]  clopidogrel (PLAVIX) 75 MG tablet Take 75 mg by mouth every morning. 12/21/20   [provider]  COVID-19 mRNA vaccine, Pfizer, 30 MCG/0.3ML injection Inject into the muscle. Patient not taking: No sig reported 06/13/20   Carlyle Basques, MD  docusate sodium (COLACE) 100 MG capsule Take 100 mg by mouth 2 (two) times daily as needed for mild constipation.    [provider]  gabapentin (NEURONTIN) 600 MG tablet Take  1 tablet  3 x /day  as needed for  Chronic Pain Patient taking differently: Take 600 mg by mouth 2 (two) times daily. 05/29/20   [name], MD  hydrALAZINE (APRESOLINE) 25 MG tablet Take 25 mg by mouth in the morning and at bedtime. hold if SBP is under 150 12/26/20   [provider]  losartan (COZAAR) 100 MG tablet Take 1 tablet Daily for BP Patient taking differently: Take 50 mg by mouth 2 (two) times daily. Hold if SBP in under 110 05/06/19   [name], MD  melatonin 3 MG TABS tablet Take 3 mg by mouth at bedtime.    [provider]  metoprolol tartrate (LOPRESSOR) 25 MG tablet Take 0.5 tablets (12.5 mg total) by mouth 2 (two) times daily. 01/05/21   Shawna Clamp, MD  Omega 3 1000 MG CAPS Take 1,000 mg by mouth daily.    [provider]  pantoprazole (PROTONIX) 40 MG tablet Take 40 mg by mouth daily. 12/21/20   [provider]   traZODone (DESYREL) 50 MG tablet Take 50 mg by mouth at bedtime.    [provider]  vitamin B-12 (CYANOCOBALAMIN) 1000 MCG tablet Take  1 tablet  Daily Patient taking differently: Take 1,000 mcg by mouth daily. 05/28/20   [name], MD    Allergies    Ace inhibitors and Hydrocodone  Review of Systems   Review of Systems  Constitutional:  Negative for chills and fever.  HENT:  Negative for ear pain and sore throat.   Eyes:  Negative for pain and visual disturbance.  Respiratory:  Negative for cough and shortness of breath.   Cardiovascular:  Negative for chest pain and palpitations.  Gastrointestinal:  Positive for abdominal pain, diarrhea, nausea and vomiting. Negative for blood in stool.  Genitourinary:  Negative for dysuria and hematuria.  Musculoskeletal:  Negative for arthralgias and back pain.  Skin:  Negative for color change and rash.  Neurological:  Negative for seizures and syncope.  All other systems reviewed and are negative.  Physical Exam Updated Vital Signs BP (!) 146/93   Pulse (!) 58   Temp 97.8 F (36.6 C) (Oral)   Resp 16   SpO2 97%   Physical Exam Vitals and nursing note reviewed.  Constitutional:      General: He is not in acute distress.    Appearance: Normal appearance. He is well-developed.  HENT:     Head: Normocephalic and atraumatic.  Eyes:     Extraocular Movements: Extraocular movements intact.     Conjunctiva/sclera: Conjunctivae normal.     Pupils: Pupils are equal, round, and reactive to light.  Cardiovascular:     Rate and Rhythm: Normal rate and regular rhythm.     Heart sounds: No murmur heard. Pulmonary:     Effort: Pulmonary effort is normal. No respiratory distress.     Breath sounds: Normal breath sounds.  Abdominal:     General: There is no distension.     Palpations: Abdomen is soft.     Tenderness: There is no abdominal tenderness.  Musculoskeletal:        General: No swelling.     Cervical back: Normal  range of motion and neck supple.  Skin:    General: Skin is warm and dry.     Capillary Refill: Capillary refill takes less than 2 seconds.  Neurological:     General: No focal deficit present.     Mental Status: He is alert and oriented to person, place, and time.  Psychiatric:        Mood and Affect: Mood normal.    ED Results / Procedures / Treatments   Labs (all labs ordered are listed, but only abnormal results are displayed) Labs Reviewed  CBC WITH DIFFERENTIAL/PLATELET  COMPREHENSIVE METABOLIC PANEL  LIPASE, BLOOD  URINALYSIS, ROUTINE W REFLEX MICROSCOPIC    EKG None  Radiology No results found.  Procedures Procedures   Medications Ordered in ED Medications  0.9 %  sodium chloride infusion (has no administration in time range)  sodium chloride 0.9 % bolus 500 mL (has no administration in time range)    ED Course  I have reviewed the triage vital signs and the nursing notes.  Pertinent labs & imaging results that were available during my care of the patient were reviewed by me and considered in my medical decision making (see chart for details).    MDM Rules/Calculators/A&P                         Patient without any further diarrhea here.  Labs no leukocytosis.  Hemoglobin good at 12.5.  COVID testing influenza testing negative.  Complete metabolic panel without any abnormalities other than an alk phos of 197.  Lipase was normal.  Urinalysis negative.  CT scan of the abdomen without any acute findings.  There is a history of prostatectomy appears to be a small residual prostate parenchyma stable in appearance compared to earlier CTs.  There is osseous metastasis of the lumbar spine and sacrum bilateral iliac bones stable compared to earlier abdomen CT and pelvis CT from March 2021.  Patient probably had viral diarrheal illness.  Patient stable.  Patient will be discharged home with instructions and a prescription for Imodium A-D as needed.  And follow-up with  primary care provider.     Final Clinical Impression(s) / ED Diagnoses Final  diagnoses:  None    Rx / DC Orders ED Discharge Orders     None        Fredia Sorrow, MD 02/04/21 2322

## 2021-02-05 ENCOUNTER — Ambulatory Visit (INDEPENDENT_AMBULATORY_CARE_PROVIDER_SITE_OTHER): Payer: Medicare HMO | Admitting: Internal Medicine

## 2021-02-05 ENCOUNTER — Encounter: Payer: Self-pay | Admitting: Internal Medicine

## 2021-02-05 VITALS — BP 134/84 | HR 68 | Temp 97.9°F | Resp 17 | Ht 67.0 in | Wt 170.2 lb

## 2021-02-05 DIAGNOSIS — Z91199 Patient's noncompliance with other medical treatment and regimen due to unspecified reason: Secondary | ICD-10-CM | POA: Diagnosis not present

## 2021-02-05 DIAGNOSIS — K589 Irritable bowel syndrome without diarrhea: Secondary | ICD-10-CM | POA: Diagnosis not present

## 2021-02-05 DIAGNOSIS — C61 Malignant neoplasm of prostate: Secondary | ICD-10-CM | POA: Diagnosis not present

## 2021-02-05 DIAGNOSIS — I480 Paroxysmal atrial fibrillation: Secondary | ICD-10-CM

## 2021-02-05 DIAGNOSIS — I10 Essential (primary) hypertension: Secondary | ICD-10-CM

## 2021-02-05 DIAGNOSIS — C7951 Secondary malignant neoplasm of bone: Secondary | ICD-10-CM | POA: Diagnosis not present

## 2021-02-05 MED ORDER — DICYCLOMINE HCL 20 MG PO TABS: ORAL_TABLET | ORAL | 0 refills | Status: AC

## 2021-02-05 NOTE — Progress Notes (Signed)
Future Appointments  Date Time Provider  02/05/2021  3:30 PM [name], MD   History of Present Illness:                                       This 87 y.o. DWM with HTN, HLD, Pre-Diabetes and Vitamin D Deficiency has significant hx/o Prostate Ca from 2003 with recurrence bony mets in 2019.  He had  a    an aggressive metastatic Prostate Cancer & is followed locally  by [facility] (Dr Lovena Neighbours)  and Oncology (Dr Alen Blew).     He  had been receiving ongoing Lupron at [facility] and is followed treated  by Dr Alen Blew on Gillermina Phy daily  & Xgeva inj  q64months.  Patient is also followed by Dr Dossie Arbour  For EDSI .        Patient's  appointment compliance in this office has been poor with multiple last minute cancellations and No Shows.                  Yesterday patient went to the ER for a 2 day hx/o Diarrhea & he vomited x 1.  Covid & Flu tests were Negative. He was given 500 cc IVF  and when all labs &Abd CT returned Negative, he was discharged home advising him to take Imodium prn.                         Patient was hospitalized recently ~ 4 weeks ago after a syncopal episode.  He has hx/o pAfib on LD  ASA / Plavix  Patient has been followed by Urology & Dr Alen Blew for Hormonal suppression (Lupron) and also systemic Chemotherapy. Patient had been on on chronic Opioids followed in pain management.            Patient is treated for HTN (2005) & BP has been controlled at home. Today's BP was initially elevated & rechecked at goal  -  134/84.   Patient does have hx/o cardiomyopathy & pAfib (2016).    Patient has had no complaints of any cardiac type chest pain, palpitations, dyspnea / orthopnea / PND, dizziness, claudication, or dependent edema.       Hyperlipidemia is not controlled with diet off meds. Last Lipids were not at goal :  Lab Results  Component Value Date   CHOL 235 (H) 04/27/2019   HDL 43 04/27/2019   Roxie Not calculated  04/27/2019   TRIG 487 (H) 04/27/2019    CHOLHDL 5.5 (H) 04/27/2019     Also, the patient has history of PreDiabetes (A1c 6.3% /2016) and has had no symptoms of reactive hypoglycemia, diabetic polys, paresthesias or visual blurring.  Last A1c was not at goal :  Lab Results  Component Value Date   HGBA1C 5.9 (H) 05/29/2020        Further, the patient also has history of Vitamin D Deficiency ("39" on Tx /2019) and supplements vitamin D without any suspected side-effects. Last vitamin D was still low :  Lab Results  Component Value Date   VD25OH 40 05/29/2020     Current Outpatient Medications on File Prior to Visit  Medication Sig   acetaminophen (TYLENOL) 325 MG tablet Take 650 mg every 6 hours as needed    aspirin EC 81 MG tablet Take  daily.   Atorvastatin 80 MG tablet Take at bedtime.  CALCIUM 600 + D  Take 1 tablet in the morning and at bedtime.   clopidogrel (PLAVIX) 75 MG tablet Take 75 every morning.   docusate sodium (COLACE) 100 MG capsule Take 2  times daily as needed for mild constipation.   gabapentin (NEURONTIN) 600 MG tablet Take 2  times daily   hydrALAZINE (APRESOLINE) 25 MG tablet Take in the morning and at bedtime   loperamide (IMODIUM A-D) 2 MG tablet Take 1 tablet  4 times daily as needed    losartan (COZAAR) 100 MG tablet Take 1 tablet Daily for BP    melatonin 3 MG TABS tablet Take  at  bedtime.   metoprolol tartrate (LOPRESSOR) 25 MG tablet Take 0.5 tablets 2  times daily.   Omega 3 1000 MG CAPS Take 1 daily.   pantoprazole (PROTONIX) 40 MG tablet Take 40 mg daily.   traZODone (DESYREL) 50 MG tablet Take 50 mg at bedtime.   vitamin B-12  1000 MCG tablet Take  daily     Allergies  Allergen Reactions   Ace Inhibitors Swelling and Other (See Comments)    Angioedema   Hydrocodone Nausea And Vomiting     PMHx:   Past Medical History:  Diagnosis Date   Arthritis    Atrial fibrillation ([place])    Bone cancer ([place])    Cardiomyopathy ([place])    Chronic kidney disease    Hyperlipidemia     Hypertension    Prostate cancer ([place]) 1997   Unstable gait 04/16/2018   unstable gait in home     Immunization History  Administered Date(s) Administered   Influenza, High Dose Seasonal PF 02/19/2015, 01/08/2017, 03/25/2018, 11/18/2018   PFIZER Comirnaty(Gray Top)Covid-19 Tri-Sucrose Vaccine 06/13/2020   PFIZER(Purple Top)SARS-COV-2 Vaccination 05/09/2019, 06/07/2019     Past Surgical History:  Procedure Laterality Date   CATARACT EXTRACTION     EYE SURGERY Bilateral    IOL/CE on Lt in 1998 and Rt in 2011.   PROSTATE SURGERY     WRIST FOREIGN BODY REMOVAL     1957 glass    FHx:    Reviewed / unchanged  SHx:    Reviewed / unchanged   Systems Review:  Constitutional: Denies fever, chills, wt changes, headaches, insomnia, fatigue, night sweats, change in appetite. Eyes: Denies redness, blurred vision, diplopia, discharge, itchy, watery eyes.  ENT: Denies discharge, congestion, post nasal drip, epistaxis, sore throat, earache, hearing loss, dental pain, tinnitus, vertigo, sinus pain, snoring.  CV: Denies chest pain, palpitations, irregular heartbeat, syncope, dyspnea, diaphoresis, orthopnea, PND, claudication or edema. Respiratory: denies cough, dyspnea, DOE, pleurisy, hoarseness, laryngitis, wheezing.  Gastrointestinal: Denies dysphagia, odynophagia, heartburn, reflux, water brash, abdominal pain or cramps, nausea, vomiting, bloating, diarrhea, constipation, hematemesis, melena, hematochezia  or hemorrhoids. Genitourinary: Denies dysuria, frequency, urgency, nocturia, hesitancy, discharge, hematuria or flank pain. Musculoskeletal: Denies arthralgias, myalgias, stiffness, jt. swelling, pain, limping or strain/sprain.  Skin: Denies pruritus, rash, hives, warts, acne, eczema or change in skin lesion(s). Neuro: No weakness, tremor, incoordination, spasms, paresthesia or pain. Psychiatric: Denies confusion, memory loss or sensory loss. Endo: Denies change in weight, skin or hair  change.  Heme/Lymph: No excessive bleeding, bruising or enlarged lymph nodes.  Physical Exam  BP 134/84   Pulse 68   Temp 97.9 F (36.6 C)   Resp 17   Ht 5\' 7"  (1.702 m)   Wt 170 lb 3.2 oz (77.2 kg)   SpO2 95%   BMI 26.66 kg/m   Appears  well nourished, well groomed  and  in no distress.  Eyes: PERRLA, EOMs, conjunctiva no swelling or erythema. Sinuses: No frontal/maxillary tenderness ENT/Mouth: EAC's clear, TM's nl w/o erythema, bulging. Nares clear w/o erythema, swelling, exudates. Oropharynx clear without erythema or exudates. Oral hygiene is good. Tongue normal, non obstructing. Hearing intact.  Neck: Supple. Thyroid not palpable. Car 2+/2+ without bruits, nodes or JVD. Chest: Respirations nl with BS clear & equal w/o rales, rhonchi, wheezing or stridor.  Cor: Heart sounds normal w/ regular rate and rhythm without sig. murmurs, gallops, clicks or rubs. Peripheral pulses normal and equal  without edema.  Abdomen: Soft & bowel sounds normal. Non-tender w/o guarding, rebound, hernias, masses or organomegaly.  Lymphatics: Unremarkable.  Musculoskeletal: Full ROM all peripheral extremities, joint stability, 5/5 strength and normal gait.  Skin: Warm, dry without exposed rashes, lesions or ecchymosis apparent.  Neuro: Cranial nerves intact, reflexes equal bilaterally. Sensory-motor testing grossly intact. Tendon reflexes grossly intact.  Pysch: Alert & oriented x 3.  Insight and judgement nl & appropriate. No ideations.  Assessment and Plan:  1. Essential hypertension  - Continue medication, monitor blood pressure at home.  - Continue DASH diet.  Reminder to go to the ER if any CP,  SOB, nausea, dizziness, severe HA, changes vision/speech.  2. Paroxysmal atrial fibrillation ([place])  3. Prostate cancer metastatic to bone ([place])  4. Poor compliance   5. Irritable bowel syndrome, unspecified type  - dicyclomine (BENTYL) 20 MG tablet; Take  1 tablet 4 x /day before Meals &  Bedtime as Needed for Nausea, Cramping, Diarrhea  Dispense: 120 tablet; Refill: 0        Discussed  regular exercise, BP monitoring, weight control to achieve/maintain BMI less than 25 and discussed med and SE's. Recommended labs to assess and monitor clinical status with further disposition pending results of labs.  I discussed the assessment and treatment plan with the patient. The patient was provided an opportunity to ask questions and all were answered. The patient agreed with the plan and demonstrated an understanding of the instructions.  I provided over 30 minutes of exam, counseling, chart review and  complex critical decision making.        The patient was advised to call back or seek an in-person evaluation if the symptoms worsen or if the condition fails to improve as anticipated.   Kirtland Bouchard, MD

## 2021-02-05 NOTE — Patient Instructions (Signed)

## 2021-02-07 ENCOUNTER — Ambulatory Visit: Payer: Medicare HMO | Admitting: Internal Medicine

## 2021-02-07 ENCOUNTER — Ambulatory Visit: Payer: Medicare Other | Admitting: Adult Health Nurse Practitioner

## 2021-02-08 IMAGING — CT CT ABD-PELV W/O CM
2 of 4 series · 16 of 46 positions shown, 18 images · non-contrast
Comparison: No prior CT. Today's bone scan is not yet performed.
Renal ultrasound 11/03/2016.

CLINICAL DATA: Prostate cancer diagnosed 23 years ago with
prostatectomy and radiation therapy. Bone metastasis diagnosed in
7825. Quit smoking 50 years ago.

EXAM:
CT ABDOMEN AND PELVIS WITHOUT CONTRAST
TECHNIQUE: Multidetector CT imaging of the abdomen and pelvis was performed
following the standard protocol without IV contrast.

[Series 2: axial st · axial · 0.78mm/px · z∈[-500,-95]mm · 13 of 95 slices shown, 15 images]
[im 7/95  soft-tissue]
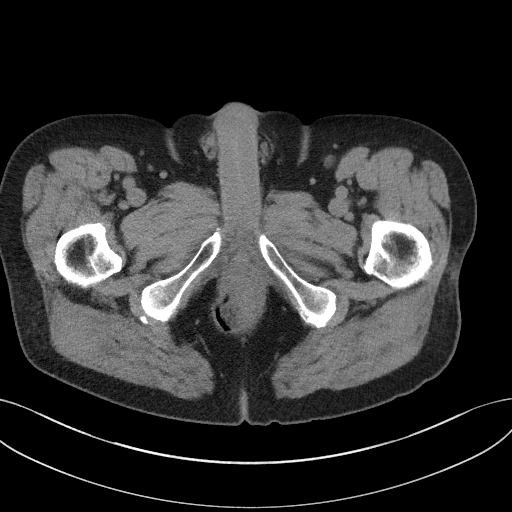
[im 7/95  bone]
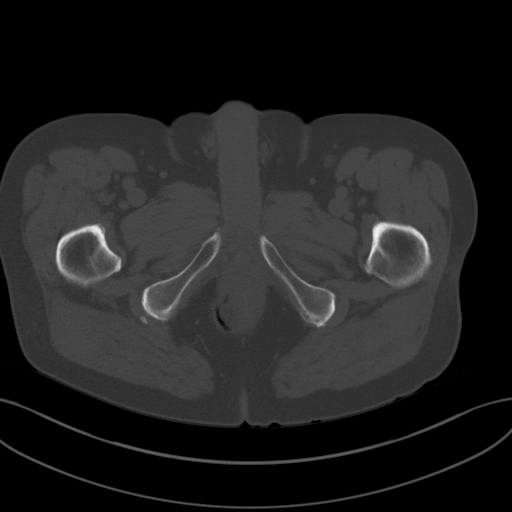
[im 13/95  soft-tissue]
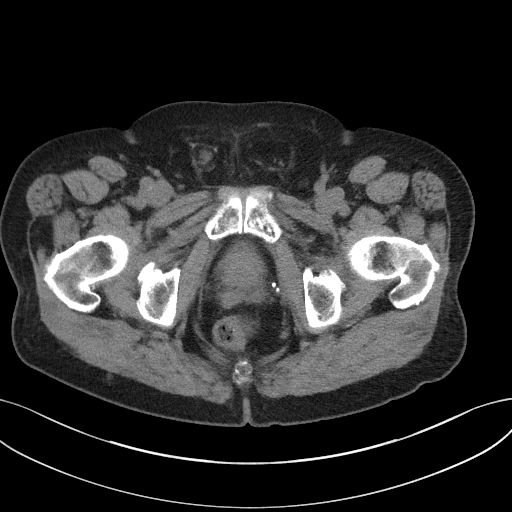
[im 19/95  soft-tissue]
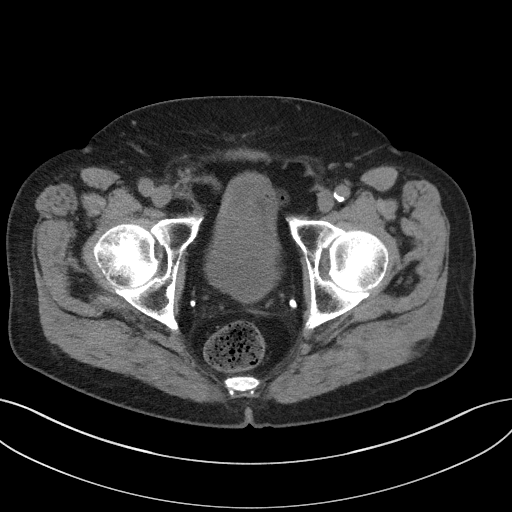
[im 26/95  soft-tissue]
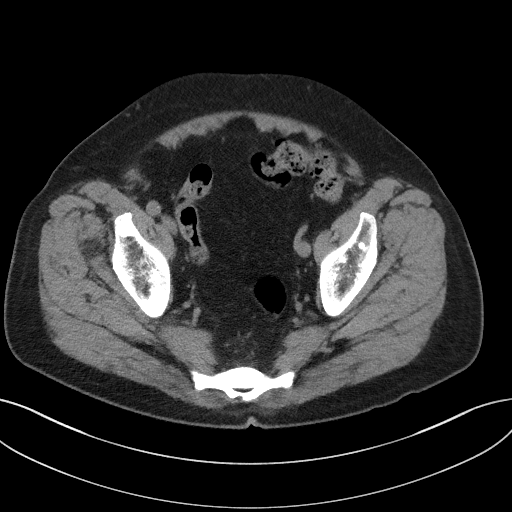
[im 32/95  soft-tissue]
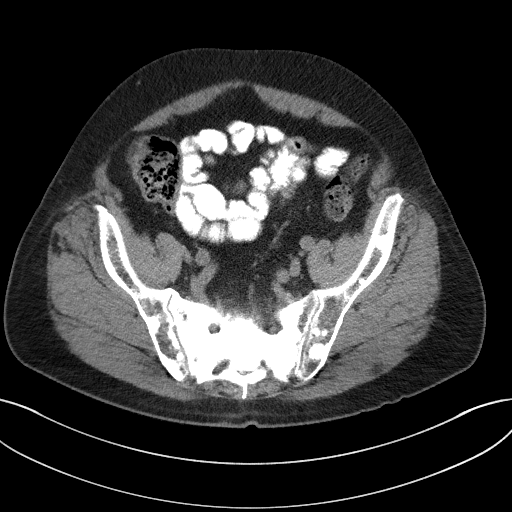
[im 38/95  soft-tissue]
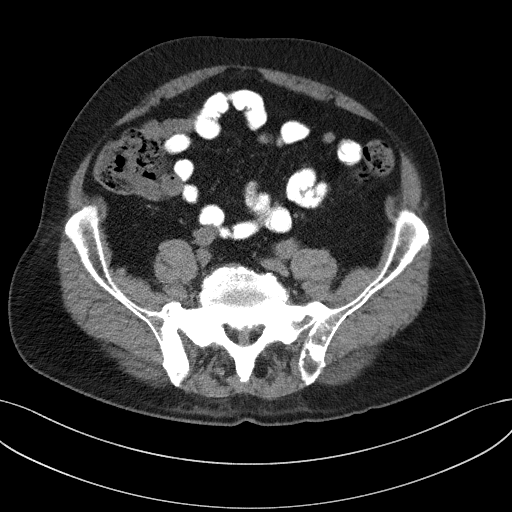
[im 51/95  soft-tissue]
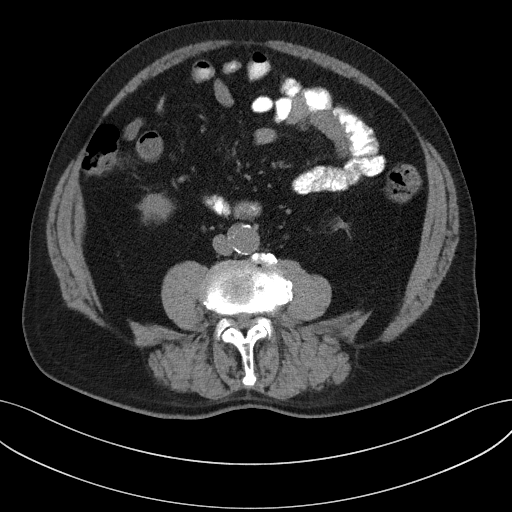
[im 57/95  soft-tissue]
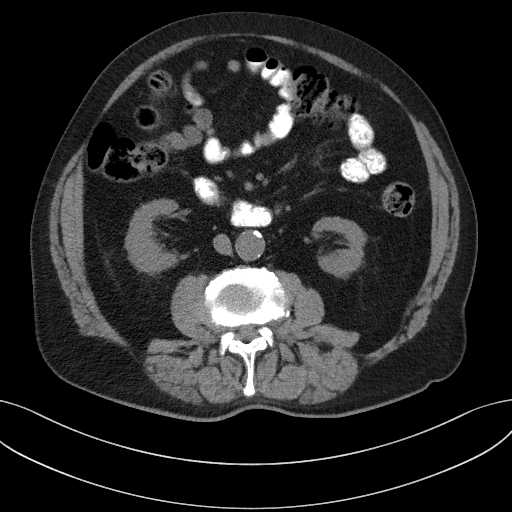
[im 63/95  soft-tissue]
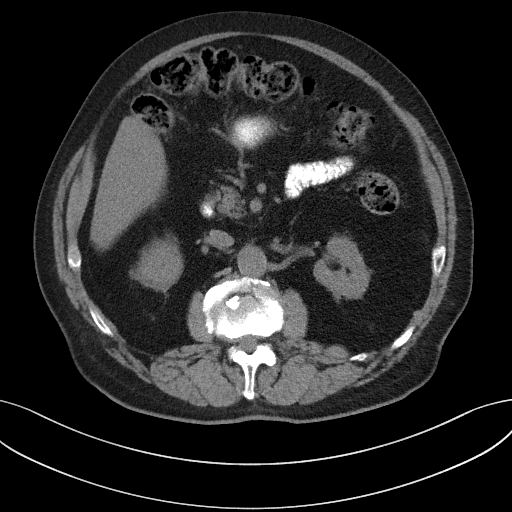
[im 63/95  bone]
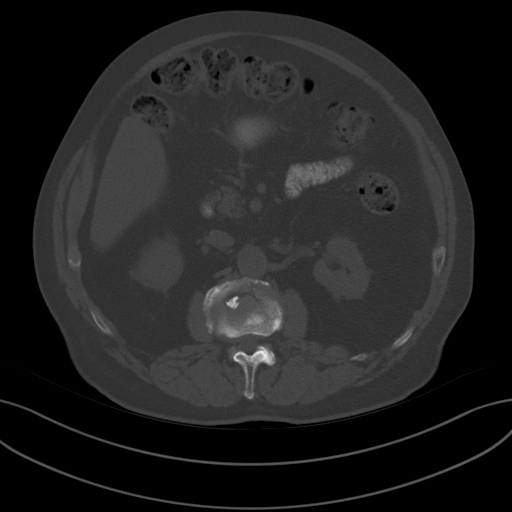
[im 69/95  soft-tissue]
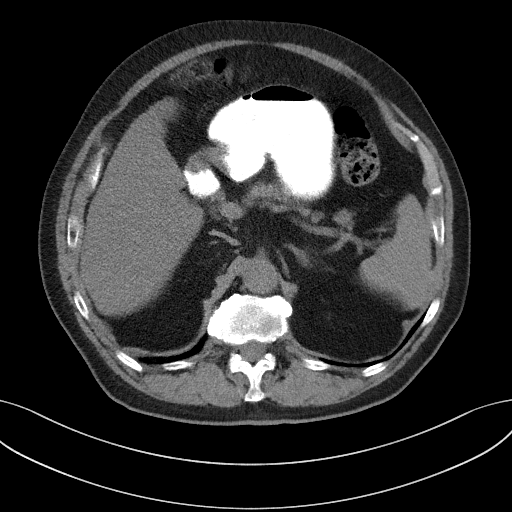
[im 76/95  soft-tissue]
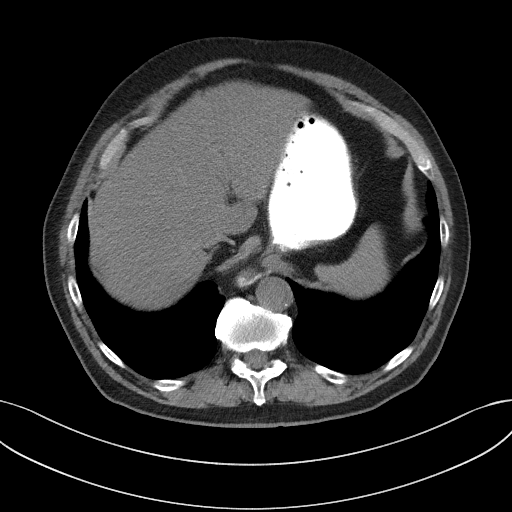
[im 82/95  soft-tissue]
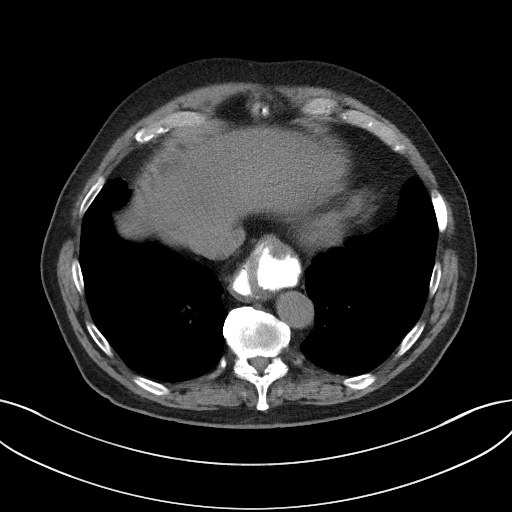
[im 88/95  soft-tissue]
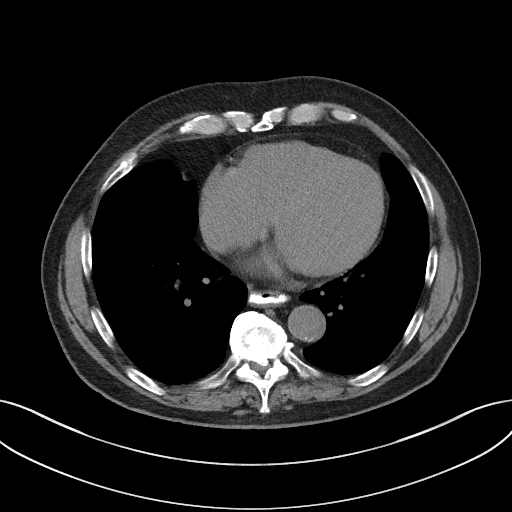

[Series 5: coronal st · coronal · 0.76mm/px · 3 of 120 slices shown]
[im 40/120  soft-tissue]
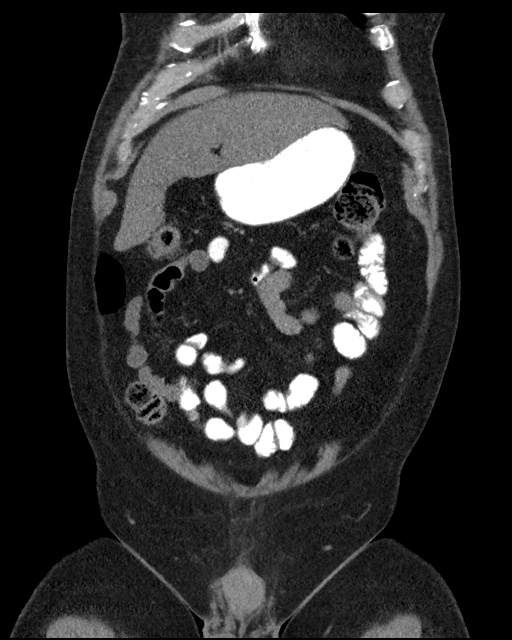
[im 53/120  soft-tissue]
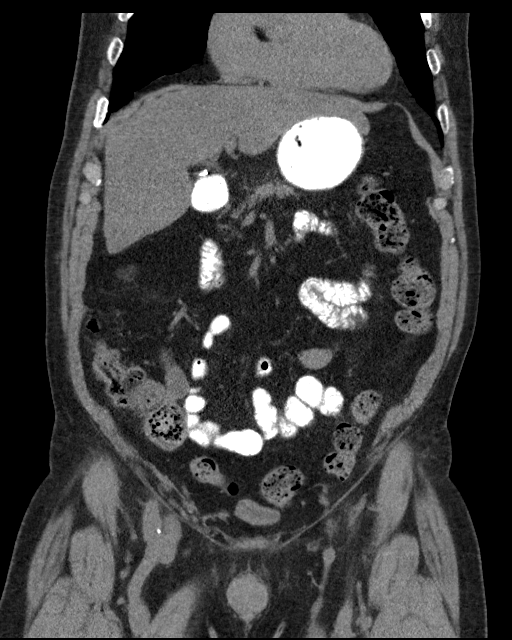
[im 67/120  soft-tissue]
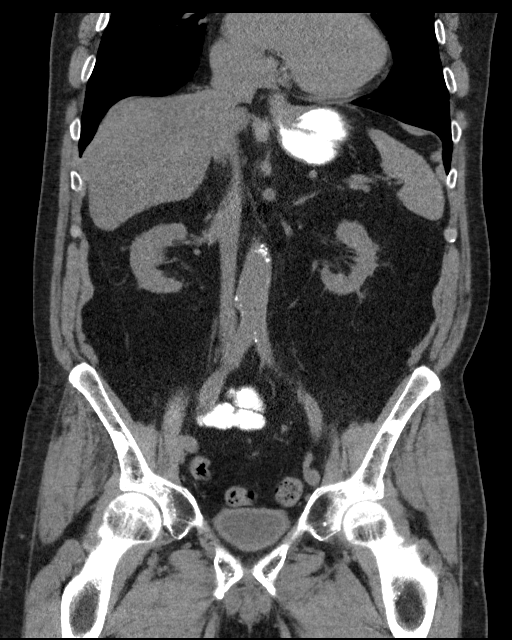

[16 of 46 positions shown; findings below may reference images not displayed]

FINDINGS: Lower chest: Clear lung bases. Borderline cardiomegaly, without
pericardial or pleural effusion. Small hiatal hernia.

Hepatobiliary: Moderate hepatic steatosis. Cholecystectomy, without
biliary ductal dilatation.

Pancreas: Normal, without mass or ductal dilatation.

Spleen: Normal in size, without focal abnormality.

Adrenals/Urinary Tract: Normal adrenal glands. No renal calculi or
hydronephrosis. Renal cortical thinning is within normal variation
for age. No hydroureter or ureteric calculi. No bladder calculi.

Stomach/Bowel: Gastric antral underdistention. Colonic stool burden
suggests constipation. Normal terminal ileum and appendix. Normal
small bowel.

Vascular/Lymphatic: Aortic atherosclerosis. No abdominopelvic
adenopathy.

Reproductive: Symmetric seminal vesicles. Despite the clinical
history of prostatectomy, soft tissue density in the prostatic bed
is favored to represent residual atrophic prostate on 84/2.

Other: No significant free fluid. Small fat containing left inguinal
hernia.

Musculoskeletal: Widespread sclerotic osseous metastasis. Sclerosis
replaces nearly the entire sacrum. index left vertebral body and
pedicle lesion at T10 measures 1.7 cm on [DATE].

L5 lesion measures 2.3 cm on 67 sagittal.

Compression deformity at L2 is moderate. Vertebral augmentation at
T11, L1, L2. Mild L3 superior endplate compression deformity.
IMPRESSION: 1. Widespread sclerotic osseous metastasis.
2. No extraosseous metastasis identified.
3. Soft tissue density in the expected location of the prostate is
favored to represent residual atrophic prostate. The clinical
history describes prostatectomy. If the patient had a prostatectomy,
locally recurrent disease would be a concern. Axumin PET may be
informative.
4. Hepatic steatosis.
5. Small hiatal hernia.
6. Possible constipation.
7. Aortic Atherosclerosis ([id number]).

## 2021-02-27 ENCOUNTER — Ambulatory Visit
Admit: 2021-02-27 | Discharge: 2021-02-27 | Payer: MEDICARE | Attending: Cardiovascular Disease | Primary: Family Medicine

## 2021-02-27 ENCOUNTER — Other Ambulatory Visit

## 2021-02-27 ENCOUNTER — Telehealth (INDEPENDENT_AMBULATORY_CARE_PROVIDER_SITE_OTHER): Admitting: Cardiovascular Disease

## 2021-02-27 VITALS — BP 130/70 | HR 78 | Temp 98.1°F | Ht 67.0 in | Wt 175.1 lb

## 2021-02-27 DIAGNOSIS — I63412 Cerebral infarction due to embolism of left middle cerebral artery: Secondary | ICD-10-CM

## 2021-02-27 DIAGNOSIS — I483 Typical atrial flutter: Secondary | ICD-10-CM

## 2021-02-27 DIAGNOSIS — I480 Paroxysmal atrial fibrillation: Secondary | ICD-10-CM | POA: Diagnosis not present

## 2021-02-27 DIAGNOSIS — I119 Hypertensive heart disease without heart failure: Secondary | ICD-10-CM | POA: Diagnosis not present

## 2021-02-27 DIAGNOSIS — E785 Hyperlipidemia, unspecified: Secondary | ICD-10-CM | POA: Diagnosis not present

## 2021-02-27 MED ORDER — apixaban (Eliquis) 2.5 mg tablet
2.5 | ORAL_TABLET | Freq: Two times a day (BID) | ORAL | 3 refills | Status: DC
Start: 2021-02-27 — End: 2021-04-12

## 2021-02-27 NOTE — Progress Notes (Signed)
 HPI: [name]. is a 87 y.o. male who presents for for hospital follow-up and to establish care in our practice.  He [facility]    He has a history of atrial flutter status post cardioversion in 2017, echo at that time showed LVEF 20%.  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   He was sent to hospital in July from palliative meeting with word finding difficulties and MR brain confirmedleft MCA territory acute ischemic infarct without hemorrhage or mass-effect. There was absent mid to distal right posterior cerebral arterial flow likely severe/critical stenosis and less likely occlusion. MRA of the neck showed no cervical arterial occlusion high-grade stenosis or dissection.   There was no evidence of atrial arrhythmia on monitor during his hospital stay.  He was discharged on 30-day event monitor of which only 10 days of available data was seen showing no atrial fibrillation.  He was started on dual antiplatelet therapy for CVA  Followed by by Dr. Kem Kays at [facility] and received Lupron and gets it every 3 months and Xgeva every month. Planning to resume Xtandi.     Presents with his family member today.  After his hospitalization in July, he went to rehab.  From rehab he went down to [place] for some time.  Apparently he was hospitalized there for short time and when the family member called the hospital, he was told that he had "A. fib" and converted to normal sinus rhythm.  His family member drove down to [place] and brought him back to [place].  He is due for a follow-up at the cancer center.  He denies any palpitations.    PMH:  1.  Left MCA distribution stroke  2.  Metastatic prostate cancer  3.  Asymptomatic sinus bradycardia  4.  History of atrial flutter requiring cardioversion in 2017, [facility]  5.  He has a history of LV  systolic dysfunction in the setting of atrial flutter with LVEF of 25% 2017>> 65% 7/22    MEDICATIONS:   Current Outpatient Medications:   .  acetaminophen (Tylenol) 325 mg capsule, Take 650 mg by mouth every 6 (six) hours if needed for pain score 1-3., Disp: , Rfl:   .  atorvastatin (Lipitor) 80 mg tablet, Take 1 tablet (80 mg) by mouth at bedtime., Disp: 30 tablet, Rfl: 11  .  calcium carbonate-vitamin D3 500 mg-5 mcg (200 unit) tablet, Take 1 tablet by mouth in the morning., Disp: , Rfl:   .  cyanocobalamin, vitamin B-12, (VITAMIN B-12 ORAL), 1 (one) time each day., Disp: , Rfl:   .  gabapentin (Neurontin) 600 mg tablet, Take 600 mg by mouth if needed in the morning, at noon, and at bedtime., Disp: , Rfl:   .  hydrALAZINE (Apresoline) 25 mg tablet, Take 25 mg by mouth in the morning and at bedtime., Disp: , Rfl:   .  losartan (Cozaar) 50 mg tablet, Take 50 mg by mouth in the morning., Disp: , Rfl:   .  melatonin 3 mg tablet, Take by mouth at bedtime., Disp: , Rfl:   .  metoprolol tartrate (Lopressor) 25 mg tablet, Take 12.5 mg by mouth in the morning and at bedtime. Takes 12.5 per heart over 60 and BP top # is over 110, Disp: , Rfl:   .  omega-3 (Fish Oil) 300-1,000 mg capsule, Take 1,000 mg by  mouth 3 (three) times a week., Disp: , Rfl:   .  pantoprazole (ProtoNix) 40 mg EC tablet, Take 40 mg by mouth before breakfast. Do not crush, chew, or split., Disp: , Rfl:   .  traZODone (Desyrel) 50 mg tablet, Take 50 mg by mouth at bedtime., Disp: , Rfl:   .  apixaban (Eliquis) 2.5 mg tablet, Take 1 tablet (2.5 mg) by mouth in the morning and at bedtime., Disp: 180 tablet, Rfl: 3    No Known Allergies       EXAM: 87 y.o. male in NAD  Vitals:    02/27/21 1057 02/27/21 1150   BP: (!) 142/82 130/70   Pulse: 78    Temp: 36.7 C (98.1 F)    SpO2: 97%    Weight: 79.4 kg (175 lb 1.9 oz)    Height: 1.702 m (5\' 7" )        Vitals and nursing note reviewed.   Constitutional:       Appearance: Healthy appearance.   Pulmonary:       Breath sounds: Normal breath sounds.   Cardiovascular:      Normal rate. Regular rhythm.      Murmurs: There is no murmur.   Skin:     General: Skin is warm.         DATA:   EKG:     Encounter Date: 02/27/21   ECG 12 lead    Narrative    HEART RATE: 78  RR Interval: 768  P-R Interval: 152  P Duration: 107  P Front Axis: 49  QRSD Interval: 87  QT Interval: 391  QTcB: 446  QTcF: 427  QRS Axis: -19  T Wave Axis: 51  QTc Framingham: 426  QTc Hodges: 422  ECG Impression:   Sinus rhythm     ECHO:  >> 7/22: LVEF 65%.  Borderline concentric LVH.  Basal posterior wall mild hypokinesis.  Moderate hypokinesis of basal inferior wall.  Normal RV size and function.  No ASD.  Thickened mitral valve leaflet without functional abnormality.  Trileaflet aortic valve with trace AI.  PASP 16 mmHg plus right atrial pressure.  >> 2017: LVEF 25%     MRA neck: 7/22: No cervical arterial occlusion, high-grade stenosis, or evidence of dissection.        Lab Results   Component Value Date    GLUCOSE 101 10/30/2020    CALCIUM 9.7 10/30/2020    NA 138 10/30/2020    K 4.6 10/30/2020    CO2 21 10/30/2020    CL 108 10/30/2020    BUN 23 10/30/2020    CREATININE 1.64 (H) 10/30/2020     Lab Results   Component Value Date    CHOL 208 (H) 10/03/2020    HDL 52 10/03/2020    LDLCALC 117 10/03/2020    TRIG 197 (H) 10/03/2020     Lab Results   Component Value Date    HGBA1C 5.5 10/03/2020    HGBA1C 5.5 10/03/2020       IMP/PLAN:  Encounter Diagnoses   Name Primary?   . Cerebrovascular accident (CVA) due to embolism of left middle cerebral artery (CMS/HCC) Yes   . Paroxysmal atrial fibrillation (CMS/HCC)    . Benign hypertensive heart disease without heart failure      Orders Placed This Encounter   Procedures   . ECG 12 lead     Atrial fibrillation  History of atrial flutter status post cardioversion.  At some point, he went off  anticoagulation.  He recently presented to hospital with word finding difficulty and MR brain confirmedleft MCA territory acute  ischemic infarct without hemorrhage or mass-effect. There was absent mid to distal right posterior cerebral arterial flow likely severe/critical stenosis and less likely occlusion. MRA of the neck showed no cervical arterial occlusion high-grade stenosis or dissection.   There was no evidence of atrial arrhythmia on monitor during his hospital stay.  He was discharged on 30-day event monitor and only 10 days of available data was seen showing no atrial fibrillation.  According to his family member he was hospitalized in [place] for "possible A. fib" we will obtain records.  I am concerned about his risk of stroke given his AF history and recent left MCA stroke.  As such, I have recommended he discontinue aspirin/clopidogrel and transition to Eliquis.  Given his age above 60 and his creatinine above 1.5, the dose will be 2.5 mg twice a day.  Gave him samples today and will send a prescription when he uses his samples.  Will obtain his records from [facility] as well especially any EKGs.      CVA  Ischemic left MCA stroke in July 2022    Hypertensive heart disease without heart failure: He was given holding parameters for his antihypertensive to allow for permissive hypertension after his stroke.  He has been holding some of his antihypertensives for systolic blood pressure less than 110 and another for systolic blood pressure less than 150.  At this point I recommended that they take the medication regularly without these holding parameters.  His blood pressures has been in the 160s to 180s systolic at times at home.     Hyperlipidemia  Continue statin

## 2021-02-27 NOTE — Telephone Encounter (Signed)
 Please make appt with Dr Kem Kays [facility] dx prostate ca w/ bone mets

## 2021-03-13 ENCOUNTER — Ambulatory Visit: Admit: 2021-03-13 | Discharge: 2021-03-13 | Payer: MEDICARE | Attending: Acute Care | Primary: Family Medicine

## 2021-03-13 ENCOUNTER — Telehealth (HOSPITAL_BASED_OUTPATIENT_CLINIC_OR_DEPARTMENT_OTHER): Admitting: Acute Care

## 2021-03-13 ENCOUNTER — Other Ambulatory Visit

## 2021-03-13 ENCOUNTER — Other Ambulatory Visit: Admit: 2021-03-13 | Payer: MEDICARE | Primary: Family Medicine

## 2021-03-13 ENCOUNTER — Encounter (HOSPITAL_BASED_OUTPATIENT_CLINIC_OR_DEPARTMENT_OTHER): Admitting: Acute Care

## 2021-03-13 VITALS — BP 138/71 | HR 62 | Temp 97.2°F | Ht 67.0 in | Wt 174.0 lb

## 2021-03-13 DIAGNOSIS — C7951 Secondary malignant neoplasm of bone: Secondary | ICD-10-CM

## 2021-03-13 DIAGNOSIS — C61 Malignant neoplasm of prostate: Secondary | ICD-10-CM

## 2021-03-13 LAB — CBC WITH DIFFERENTIAL
Basophils %: 0.4 %
Basophils Absolute: 0.02 10*3/uL (ref 0.00–0.22)
Eosinophils %: 3.6 %
Eosinophils Absolute: 0.16 10*3/uL (ref 0.00–0.50)
Hematocrit: 36.9 % — ABNORMAL LOW (ref 37.0–53.0)
Hemoglobin: 11.9 g/dL — ABNORMAL LOW (ref 13.0–17.5)
Immature Granulocytes %: 0.2 %
Immature Granulocytes Absolute: 0.01 10*3/uL (ref 0.00–0.10)
Lymphocyte %: 30.1 %
Lymphocytes Absolute: 1.35 10*3/uL (ref 0.70–4.00)
MCH: 32.8 pg (ref 26.0–34.0)
MCHC: 32.2 g/dL (ref 31.0–37.0)
MCV: 101.7 fL — ABNORMAL HIGH (ref 80.0–100.0)
MPV: 10.4 fL (ref 9.1–12.4)
Monocytes %: 8.5 %
Monocytes Absolute: 0.38 10*3/uL (ref 0.38–0.83)
NRBC %: 0 % (ref 0.0–0.0)
NRBC Absolute: 0 10*3/uL (ref 0.00–2.00)
Neutrophil %: 57.2 %
Neutrophils Absolute: 2.57 10*3/uL (ref 1.50–7.95)
Platelets: 144 10*3/uL — ABNORMAL LOW (ref 150–400)
RBC: 3.63 M/uL — ABNORMAL LOW (ref 4.20–5.90)
RDW-CV: 14.2 % (ref 11.5–14.5)
RDW-SD: 53.1 fL — ABNORMAL HIGH (ref 35.0–51.0)
WBC: 4.5 10*3/uL (ref 4.0–11.0)

## 2021-03-13 LAB — PHOSPHORUS: Phosphorus: 2.2 mg/dL — ABNORMAL LOW (ref 2.4–4.9)

## 2021-03-13 LAB — MAGNESIUM: Magnesium: 2.4 mg/dL (ref 1.6–2.6)

## 2021-03-13 LAB — COMPREHENSIVE METABOLIC PANEL
ALT: 18 U/L (ref 0–55)
AST: 27 U/L (ref 6–42)
Albumin: 3.6 g/dL (ref 3.2–5.0)
Alkaline phosphatase: 370 U/L — ABNORMAL HIGH (ref 30–130)
Anion Gap: 4 mmol/L (ref 3–14)
BUN: 16 mg/dL (ref 6–24)
Bilirubin, total: 0.8 mg/dL (ref 0.2–1.2)
CO2 (Bicarbonate): 26 mmol/L (ref 20–32)
Calcium: 8.9 mg/dL (ref 8.5–10.5)
Chloride: 109 mmol/L (ref 98–110)
Creatinine: 1.25 mg/dL (ref 0.55–1.30)
Glucose: 140 mg/dL — ABNORMAL HIGH (ref 70–110)
Potassium: 3.9 mmol/L (ref 3.6–5.2)
Protein, total: 6.4 g/dL (ref 6.0–8.4)
Sodium: 139 mmol/L (ref 135–146)
eGFRcr: 56 mL/min/{1.73_m2} — ABNORMAL LOW (ref >=60)

## 2021-03-13 LAB — PSA TOTAL, DIAGNOSTIC: PSA: 117.75 ng/mL — ABNORMAL HIGH (ref 0.00–4.00)

## 2021-03-13 MED ORDER — oxyCODONE (Roxicodone) 5 mg immediate release tablet
5 | ORAL_TABLET | ORAL | 0 refills | 8.00000 days | Status: AC | PRN
Start: 2021-03-13 — End: 2021-03-20

## 2021-03-13 NOTE — Progress Notes (Signed)
Patient ID: [name]. is a 88 y.o. male with metastatic prostate cancer.    Primary Care Provider: Perfecto Kingdom, MD      ONCOLOGIC HISTORY:   Dx: Metastatic prostate cancer  Tx: Lupron, Su Monks (currenly not taking any)    Patient initially was admitted to the hospital s/p fall down the stairs in May 2019, resulting in a back injury.  Extensive work-up including CT head, CT spine and MRI spine was done.  It revealed evidence of metastatic disease to the back and a mild compression fracture of L2.  There was no evidence of spinal cord compression.  PSA was checked and it returned high at 245.  He underwent kyphoplasty with IR ablation with Dr. Debroah Loop 09/09/2017  for T11-L2. He was give analgesics for the pain.  He was then started on Lupron and Casodex, as well as Xgeva. Casodex was then discontinued and he remained on the other two. For his bony met in the back, he underwent kyphoplasty. Repeat PSAs had been low, but in early 2021, PSA was noted to have increased to 7.1 in while patient was staying in [place]. Restaging scans showed no visceral disease, but worsening bony mets. Thus, in March 2021, he was started on Xtandi (reduced dose at 80 mg daily and then 40 mg daily due to loose stools). He moved back to the [place] area in the summer.  Then, in Aug 2021, he left for [facility] again.    In Aug 2021, he was living in [place]. He was on Lupron,  [place] - last doses of Lupron and Xtandi were in Feb 2022 when he was living there.  At his last visit with Dr Domenic Moras in June 2022, she reordered the Lupron, [place], [place]. However, patient was admitted to [facility] with an acute CVA, so he has not yet resumed these medications. Patient was sent to acute rehab, and recovered. Seen in office Aug 2022, and was reordered for Lupron, Su Monks. Decided not to resume, and went to [place]. Reports has episode of a-fib there in Oct 2022, and was cardioverted. Started on  anticoagulation. Also was seen at [facility] today to establish care.    INTERVAL HISTORY: Since his last visit here, patient is experiencing significant bone pain in his back, 9/10 most of the day. He is teary eyes discussing his pain. He takes Tylenol arthritis and uses lidocaine patches with little to no relief. Denies any bladder incontinence or saddle anesthesia. Is eating, but little interest in food due to pain. Bowel movements every other day usually, this morning they were loose. Admits he has not been taking his Xtandi- doesnt want to deal with frequent blood draws. Also has not been getting Xgeva or Lupron as he left the state. Ready to reestablish care at this point. Still having mild residual deficits from his CVA (trouble swallowing at times and expressive aphasia).     Review of Systems:  A detailed and complete review of systems with emphasis on today's chief complaint was performed, and all other systems were negative other than as denoted elsewhere in this note.      No Known Allergies     Current Outpatient Medications:   .  acetaminophen (Tylenol) 325 mg capsule, Take 650 mg by mouth every 6 (six) hours if needed for pain score 1-3., Disp: , Rfl:   .  apixaban (Eliquis) 2.5 mg tablet, Take 1 tablet (2.5 mg) by mouth in the morning  and at bedtime., Disp: 180 tablet, Rfl: 3  .  atorvastatin (Lipitor) 80 mg tablet, Take 1 tablet (80 mg) by mouth at bedtime., Disp: 30 tablet, Rfl: 11  .  calcium carbonate-vitamin D3 500 mg-5 mcg (200 unit) tablet, Take 1 tablet by mouth in the morning., Disp: , Rfl:   .  cyanocobalamin, vitamin B-12, (VITAMIN B-12 ORAL), 1 (one) time each day., Disp: , Rfl:   .  gabapentin (Neurontin) 600 mg tablet, Take 600 mg by mouth if needed in the morning, at noon, and at bedtime., Disp: , Rfl:   .  hydrALAZINE (Apresoline) 25 mg tablet, Take 25 mg by mouth in the morning and at bedtime., Disp: , Rfl:   .  losartan (Cozaar) 50 mg tablet, Take 50 mg by mouth in the morning., Disp: ,  Rfl:   .  melatonin 3 mg tablet, Take by mouth at bedtime., Disp: , Rfl:   .  metoprolol tartrate (Lopressor) 25 mg tablet, Take 12.5 mg by mouth in the morning and at bedtime. Takes 12.5 per heart over 60 and BP top # is over 110, Disp: , Rfl:   .  omega-3 (Fish Oil) 300-1,000 mg capsule, Take 1,000 mg by mouth 3 (three) times a week., Disp: , Rfl:   .  pantoprazole (ProtoNix) 40 mg EC tablet, Take 40 mg by mouth before breakfast. Do not crush, chew, or split., Disp: , Rfl:   .  traZODone (Desyrel) 50 mg tablet, Take 50 mg by mouth at bedtime., Disp: , Rfl:   .  oxyCODONE (Roxicodone) 5 mg immediate release tablet, Take 1 tablet (5 mg) by mouth every 4 (four) hours if needed for pain score 4-6 for up to 7 days., Disp: 20 tablet, Rfl: 0     Treatment Plans     Name Type Plan Dates Plan Provider         Active    Enzalutamide Oral - Prostate Ancillary  09/27/2020 - Present Micheline Maze, MD                  Physical Examination:   Performance Status: Symptomatic; in bed <50% of the day  Blood pressure 138/71, pulse 62, temperature 36.2 C (97.2 F), temperature source Temporal, height 1.702 m, weight 78.9 kg, SpO2 95 %.,  , Body surface area is 1.93 meters squared. body mass index is 27.25 kg/m.     Constitutional: Chronically ill-appearing elderly male, not in acute distress. Sitting in wheelchair  HEENT: Oropharynx normal, trachea midline, neck supple, no masses.   Lymphatics: No cervical, supraclavicular, or axillary adenopathy.   Respiratory: Lungs clear to auscultation bilaterally.   CV: Regular rate and rhythm, no murmur  GI: Soft, nontender, without masses or organomegaly; normal bowel sounds.  Musculoskeletal: Sitting in wheelchair. Tenderness to lumbar spine and left clavical. 4/5 strength in bilateral LE, 5/5 in bilateral UE, normal grip  Extremities: No edema, clubbing, or signs of VTE.  Neuro: Alert and oriented; no motor asymmetry or gait disturbance.   Skin: No rash or ecchymoses.    Labs:  CBC:  Lab  Results   Component Value Date    WBC 4.5 03/13/2021    HGB 11.9 (L) 03/13/2021    HCT 36.9 (L) 03/13/2021    PLT 144 (L) 03/13/2021    LYMPHOPCT 30.1 03/13/2021    MONOPCT 8.5 03/13/2021    EOSPCT 3.6 03/13/2021     Chemistry:  Lab Results   Component Value Date    NA 138  10/30/2020    K 4.6 10/30/2020    CL 108 10/30/2020    CO2 21 10/30/2020    BUN 23 10/30/2020    CREATININE 1.64 (H) 10/30/2020    CALCIUM 9.7 10/30/2020    GLUCOSE 101 10/30/2020       Lab Results   Component Value Date    IRON 68 09/03/2020    FERRITIN 223.9 09/03/2020    TIBC 253 09/03/2020     Tumor Markers:  Lab Results   Component Value Date    PSA 3.17 09/03/2020     Lab Results   Component Value Date    PSA 117.75 (H) 03/13/2021     ASSESSMENT/PLAN:  Problem List Items Addressed This Visit        Genitourinary    Prostate cancer (CMS/HCC)    Relevant Medications    oxyCODONE (Roxicodone) 5 mg immediate release tablet    Other Relevant Orders    PET/CT WHOLE BODY    Comprehensive metabolic panel (Completed)    PSA Total, Diagnostic (Completed)    CBC and differential (Completed)       Musculoskeletal    Bone metastases (CMS/HCC) - Primary    Relevant Medications    oxyCODONE (Roxicodone) 5 mg immediate release tablet    Other Relevant Orders    PET/CT WHOLE BODY    Comprehensive metabolic panel (Completed)    PSA Total, Diagnostic (Completed)    CBC and differential (Completed)    Magnesium (Completed)    Phosphorus (Completed)     This is an 88 y.o. male with stage IV prostate cancer, metastatic to the bones, was on Lupron, [place], but was lost to follow up when [place] out [place]. He saw Dr Kem Kays in June 2022 and she reordered him for Lupron, Xgeva, and Xtandi, but patient had not yet resumed these given his hospitalization and he was in acute rehab. He was lost to follow up again when he went to [place], was recently hospitalized with a-fib in October 2022 down in [place], is now back in [place] living with his  daughter, [name]. Has not resumed his medications for his prostate cancer. He is ordered for Lupron, [place], and Xtandi.    [name] is having intense 9/10 pain in the lumbar area of his back, and well as his left clavical. The pain is non-radiating and constant. He has no radicular symptoms or weakness currently, and no neurological deficits on exam aside from residual dysphagia/mild expressive aphasia from his CVA that occurred over the summer. I will obtain a restaging PET/CT now due to concerning intense bone pain. ? Role for palliative RT (patient had declined in the past, but is willing to revisit given his intense pain). I will also obtain baseline labs and PSA as he has not had any treatment for his cancer since June.     Oxycodone ordered for intense back pain. Bowel regimen recommended. Will assure PC team sees pt for ongoing management.    For his chronic neuropathy, he takes gabapentin TID. He will cont this      Patient is agreeable to plan.  He will return for follow-up after PET/CT and discuss again starting [name] (is hesitant to resume this). He will resume his Lupron and Xgeva ASAP. He knows to call with questions/concerns anytime. He will be seen back in 1-2 weeks to go over results and make appropriate referrals.    [name], AOCNP    -ADDENDUM 03/14/20: Labs resulted after  visit. Alk phos elevated in 300 range and PSA with significant increase from 3.17 in June, now up to 117.75.  Request submitted last night for urgent scheduling of his prostate cancer treatment.

## 2021-03-13 NOTE — Telephone Encounter (Signed)
Hi teams-  This patient was lost to follow up, needs Lupron/Xgeva resumed ASAP. Not sure where he was getting it before so included both [facility] and [facility].    He is refusing to resume Xtandi now. I will discuss with him again in the future    thanks

## 2021-03-14 ENCOUNTER — Telehealth (HOSPITAL_BASED_OUTPATIENT_CLINIC_OR_DEPARTMENT_OTHER): Admitting: Acute Care

## 2021-03-14 NOTE — Telephone Encounter (Signed)
Please schedule pt to see [place] asap. Severe pain, started on oxycodone for now

## 2021-03-14 NOTE — Telephone Encounter (Signed)
He needs it ASAP, so wherever he can get it soonest

## 2021-03-18 ENCOUNTER — Encounter: Payer: MEDICARE | Attending: Cardiovascular Disease | Primary: Family Medicine

## 2021-03-19 ENCOUNTER — Other Ambulatory Visit

## 2021-03-19 ENCOUNTER — Ambulatory Visit: Payer: MEDICARE | Primary: Internal Medicine

## 2021-03-19 DIAGNOSIS — C61 Malignant neoplasm of prostate: Secondary | ICD-10-CM

## 2021-03-19 DIAGNOSIS — C7951 Secondary malignant neoplasm of bone: Secondary | ICD-10-CM | POA: Diagnosis not present

## 2021-03-19 DIAGNOSIS — K449 Diaphragmatic hernia without obstruction or gangrene: Secondary | ICD-10-CM | POA: Diagnosis not present

## 2021-03-19 DIAGNOSIS — I517 Cardiomegaly: Secondary | ICD-10-CM | POA: Diagnosis not present

## 2021-03-20 ENCOUNTER — Other Ambulatory Visit (HOSPITAL_BASED_OUTPATIENT_CLINIC_OR_DEPARTMENT_OTHER): Admitting: Acute Care

## 2021-03-20 ENCOUNTER — Telehealth (HOSPITAL_BASED_OUTPATIENT_CLINIC_OR_DEPARTMENT_OTHER)

## 2021-03-20 MED ORDER — potassium, sodium phosphates (Phos-NaK) 280-160-250 mg packet
280-160-250 | PACK | Freq: Four times a day (QID) | ORAL | 0 refills | 7.00000 days | Status: AC
Start: 2021-03-20 — End: 2021-03-27

## 2021-03-20 NOTE — Telephone Encounter (Signed)
Can we set up consult with rad/onc.  Saw Dr Myrtis Ser in past- I let Dr Myrtis Ser know and he is willing to see pt again    Thanks!

## 2021-03-20 NOTE — Telephone Encounter (Signed)
Left Msg x2 with daughter to set up Lupron Injections/Xgeva.

## 2021-03-21 NOTE — Telephone Encounter (Signed)
Thanks! Also was waiting for urgent Lupron appt. [name] said she was playing phone tag. She is having surgery next week, so trying to get him in ASAP

## 2021-03-25 ENCOUNTER — Other Ambulatory Visit

## 2021-03-25 ENCOUNTER — Encounter (HOSPITAL_BASED_OUTPATIENT_CLINIC_OR_DEPARTMENT_OTHER)

## 2021-03-25 ENCOUNTER — Ambulatory Visit: Admit: 2021-03-25 | Discharge: 2021-03-25 | Payer: MEDICARE | Primary: Family Medicine

## 2021-03-25 ENCOUNTER — Encounter (HOSPITAL_BASED_OUTPATIENT_CLINIC_OR_DEPARTMENT_OTHER): Admitting: Internal Medicine

## 2021-03-25 DIAGNOSIS — C61 Malignant neoplasm of prostate: Secondary | ICD-10-CM

## 2021-03-25 DIAGNOSIS — C7951 Secondary malignant neoplasm of bone: Secondary | ICD-10-CM | POA: Diagnosis not present

## 2021-03-25 MED ORDER — leuprolide (3-month) (Eligard) injection 22.5 mg
22.5 | Freq: Once | SUBCUTANEOUS | Status: AC
Start: 2021-03-25 — End: 2021-03-25
  Administered 2021-03-25: 22:00:00 22.5 mg via SUBCUTANEOUS

## 2021-03-25 MED ORDER — denosumab (Xgeva) 120 mg/1.7 mL (70 mg/mL) injection  - Omnicell Override Pull
120 | SUBCUTANEOUS | Status: AC
Start: 2021-03-25 — End: ?

## 2021-03-25 MED ORDER — denosumab (Xgeva) injection 120 mg
120 | Freq: Once | SUBCUTANEOUS | Status: AC
Start: 2021-03-25 — End: 2021-03-25
  Administered 2021-03-25: 21:00:00 120 mg via SUBCUTANEOUS

## 2021-03-25 MED FILL — LEUPROLIDE 22.5 MG (3 MONTH) SUBCUTANEOUS SYRINGE: 22.5 22.5 mg | SUBCUTANEOUS | Qty: 1

## 2021-03-25 MED FILL — DENOSUMAB 120 MG/1.7 ML (70 MG/ML) SUBCUTANEOUS SOLUTION: 120 120 mg/1.7 mL (70 mg/mL) | SUBCUTANEOUS | Qty: 1.7

## 2021-03-25 NOTE — Progress Notes (Signed)
Patient here at [facility] for Xgeva 120mg  SC injection every 84 days.     Patient also here for Lupron 22.5mg  injection every 84 days.    Patient accompanied by family member.    Labs reviewed from 03/13/2021 and verified by Sharlyne Pacas, in the pharmacy.     Injections administered tolerated well, no adverse reactions noted.     Patient aware of next appointment.     Patient discharged from [facility] in stable condition via wheelchair transported by his family member.

## 2021-03-27 ENCOUNTER — Encounter (HOSPITAL_BASED_OUTPATIENT_CLINIC_OR_DEPARTMENT_OTHER): Admitting: Radiation Oncology

## 2021-03-29 ENCOUNTER — Ambulatory Visit: Payer: MEDICARE | Attending: Radiation Oncology | Primary: Family Medicine

## 2021-03-29 ENCOUNTER — Ambulatory Visit: Payer: MEDICARE | Primary: Family Medicine

## 2021-04-05 ENCOUNTER — Ambulatory Visit: Payer: MEDICARE | Primary: Family Medicine

## 2021-04-05 ENCOUNTER — Encounter (HOSPITAL_BASED_OUTPATIENT_CLINIC_OR_DEPARTMENT_OTHER)

## 2021-04-05 ENCOUNTER — Telehealth (HOSPITAL_BASED_OUTPATIENT_CLINIC_OR_DEPARTMENT_OTHER): Admitting: Internal Medicine

## 2021-04-05 ENCOUNTER — Encounter (HOSPITAL_BASED_OUTPATIENT_CLINIC_OR_DEPARTMENT_OTHER): Admitting: Internal Medicine

## 2021-04-05 ENCOUNTER — Encounter (HOSPITAL_BASED_OUTPATIENT_CLINIC_OR_DEPARTMENT_OTHER): Admitting: Radiation Oncology

## 2021-04-05 ENCOUNTER — Ambulatory Visit: Payer: MEDICARE | Attending: Radiation Oncology | Primary: Internal Medicine

## 2021-04-05 ENCOUNTER — Other Ambulatory Visit (HOSPITAL_BASED_OUTPATIENT_CLINIC_OR_DEPARTMENT_OTHER): Admitting: Nurse Practitioner

## 2021-04-05 ENCOUNTER — Other Ambulatory Visit (HOSPITAL_BASED_OUTPATIENT_CLINIC_OR_DEPARTMENT_OTHER): Admitting: Radiation Oncology

## 2021-04-05 VITALS — BP 136/64 | HR 62 | Temp 96.6°F | Resp 16 | Ht 67.0 in | Wt 167.5 lb

## 2021-04-05 DIAGNOSIS — C61 Malignant neoplasm of prostate: Secondary | ICD-10-CM

## 2021-04-05 DIAGNOSIS — C7951 Secondary malignant neoplasm of bone: Secondary | ICD-10-CM | POA: Diagnosis not present

## 2021-04-05 MED ORDER — ondansetron ODT (Zofran-ODT) disintegrating tablet 4 mg
4 | Freq: Two times a day (BID) | ORAL | Status: DC
Start: 2021-04-05 — End: 2021-04-12

## 2021-04-05 MED ORDER — oxyCODONE (Roxicodone) 5 mg immediate release tablet
5 | ORAL_TABLET | Freq: Four times a day (QID) | ORAL | 0 refills | 8.00000 days | Status: DC | PRN
Start: 2021-04-05 — End: 2021-04-12

## 2021-04-05 MED ORDER — oxyCODONE (Roxicodone) 5 mg immediate release tablet
5 | ORAL_TABLET | Freq: Four times a day (QID) | ORAL | 0 refills | 8.00000 days | Status: DC | PRN
Start: 2021-04-05 — End: 2021-04-05

## 2021-04-05 MED ORDER — ondansetron (Zofran) 4 mg tablet
4 | ORAL_TABLET | Freq: Two times a day (BID) | ORAL | 0 refills | Status: DC | PRN
Start: 2021-04-05 — End: 2021-04-12

## 2021-04-05 NOTE — Telephone Encounter (Signed)
Hello,    [name], [name]'s daughter, reached out on his behalf. [name] states that [name] only has a 1/2 pill left of his OxyCODONE and is in need of a refill.    Can we please give her a call once the order has been submitted?    Thank you!

## 2021-04-05 NOTE — Telephone Encounter (Signed)
[name],  Can you help with this refill?  Last Oxycodone Rx sent by [facility] on 01/04 (Oxycodone 5mg  PO Q4hours/dispensed 20 on 03/13/21).  [facility] pharmacy on file.  Thanks

## 2021-04-05 NOTE — Other (Signed)
Patient is here in wheelchair d/t severe back pain accompanied by daughter Zella BallRobin for radiation consult.  Patient and daughter unsure if he received radiation for his prostate cancer.  No pacemaker or defibrilator.

## 2021-04-05 NOTE — Other (Signed)
Radiation Oncology Consultation   Patient Name:     [name], [name]      Date of Service:     April 05, 2021        Patient ID:     [id number]     Date of Birth:     August 12, 1931        Clinician:     Kaylyn LayerMatthew Iola Turri, MD       Referring Clinician:     Micheline MazeANASUYA GUNTURI, MD                       DIAGNOSIS:  Prostate cancer with multiple bone metastases.       REQUESTING PROVIDERS:  Micheline MazeAnasuya Gunturi, [name] and Melissa Cullingford, N.P.     CHIEF COMPLAINT:  Increasingly severe prostate cancer with bone metastases.     HISTORY:  The patient is an 88 year old male with a history of prostate cancer treated in 2006.  I met with him to discuss possible radiation therapy previously in 2019.  He now returns with progressive prostate cancer and increasing pain for which he may benefit from palliative radiation therapy.     The patient comes today with his daughter.  He has a history of prostate cancer diagnosed in 2006 for which he underwent radiation to the prostate and seminal vesicles to 54 Gy with a 21.6 Gy boost for a total of 75.6 Gy.  He had signs of a PSA recurrence in 2019 and met with me.  He then underwent kyphoplasty and had essentially complete pain relief so that we did not offer radiation at that time and he opted for hormonal and systemic therapy.     The patient has been on additional medication for androgen resistance with Xtandi, and has moved back and [place] between [place] and here.  However, more recently he is worsened enough that he has been staying in [place] with his daughter, Zella BallRobin.  He has stopped Su MonksXtandi, Xgeva and Lupron, and over the last 3-4 months has had increasing low back pain and right hip pain.  Of note, there is a question of left clavicular pain but that sounds to be his daughter's issue from her explanation.  The patient did not comment on left shoulder pain at this point in time.     The pain is mostly in the low back radiating to the right buttock area with a pain level  8-10/10.  He is taking oxycodone 5 mg, half-tablet twice a day in addition to some ibuprofen to help with pain but not consistently.  When he takes it as needed it decreases pain a little but if he does not take it regularly it does not help as much.  He also has found that a full 5 mg pill is too much so he is taking the half-tablets.     FDG PET/CT scan 03/19/2021 shows multifocal disease, with apparent uptake in T11, L1, and L2 in addition to uptake in other areas of the spine, the ribs, left ilium, left acetabulum, left proximal femoral diaphysis.  There is no uptake on the right side, and for the left clavicle no evidence of uptake.  Interestingly, in reviewing his bone scan from July 2021, there is some uptake in the sacrum and sacroiliac joints, not currently appreciable on PET/CT scan.     PAST MEDICAL AND SURGICAL HISTORY:  Metastatic prostate cancer as described above.  No pelvic or other sites of radiation therapy since his  treatment at [facility] in 2006.     CURRENT MEDICATIONS:  1.  Oxycodone 5 mg half-tablet twice a day.  2.  Apixaban.  3.  Atorvastatin.  4.  Losartan.  5.  Gabapentin.  6.  Metoprolol.  7.  Hydralazine.  8.  Acetaminophen.  9.  Pantoprazole.  10.  Trazodone.  11.  Calcium.  12.  Omega fish oil.  13.  B12 supplement, as noted and reconciled in the EMR.     ALLERGIES:  No known drug allergies.     FAMILY HISTORY:  Noncontributory.     SOCIAL HISTORY:  The patient is retired and currently lives with his daughter.  He is in need of significant amount of support particularly given his increasing pain issues recently.  He is widowed so his daughter is his primary support.  No current cigarette smoking, alcohol or tobacco.     REVIEW OF SYSTEMS:  Otherwise negative.     PHYSICAL EXAM:  GENERAL:  Pleasant Caucasian male, uncomfortable sitting in a wheelchair.  He does appear to be thin and chronically ill but in no acute distress.  VITAL SIGNS:  KPS 60%, ECOG 1.  Temperature 96.6 F.  HR 62.  RR  16.  BP 136/64.  SaO2 97% on room air.  SKIN:  No cyanosis, jaundice, rash, or pallor, nondiaphoretic.  HEENT:  Normocephalic, atraumatic, sclerae anicteric, EOMI.  NECK:  Supple.  No accessory respiratory muscle use.  No stridor.  PULMONARY:  Breathing comfortably, no acute distress with no wheezing or stridor.  ABDOMEN:  Benign. MUSCULOSKELETAL:  He has no point tenderness in the cervical, thoracic or lumbar spine.  Mild discomfort in the right sacroiliac joint, no discrete soft tissue mass.  No point tenderness in the hip.  EXTREMITIES:  No clubbing, cyanosis or edema.  Good range of motion in the right lower extremity with no pain on internal or external rotation.  NEUROLOGIC:  Alert and oriented x3.  Speech fluent.  Cranial nerves grossly focally intact.  No focal sensory or motor deficits.  He can walk and ambulate with a cane slowly without ataxia or footdrop.     DATA REVIEW:  I reviewed the patient's PET/CT and other imaging.  He does have some changes that may be from kyphoplasty but also has disease in the thoracolumbar spine.  There is no FDG avid uptake in the sacrum but he did have signs of disease there previously by bone scan.  PSA now is 117, increased from 3.24 August 2020.  Alkaline phosphatase is also now elevated at 370.     IMPRESSION:  An 88 year old male with multifocal bone metastases, with severe pain 9/10 in the lumbar spine and either in the sacrum or right hip.  I reviewed with the patient and his daughter the rationale for palliative radiation therapy.  The course of treatment would be much shorter than his treatment previously received for his prostate.  The lumbar spine may be helpful to radiate and given his involvement of the sacrum previously I would favor treatment for T12 through the lumbosacral spine including the sacroiliac joints, 2000 Gy in 5 fractions.  I do not have any focal area I can detect at the moment, but if at CT simulation he has clear involvement of the right femur  it may be worth considering radiation to the right hip and femur.  This may be closer to the prostate and pelvic field, but should be safe to do at a low dose.  However,  I am not sure that will be necessary and discussed with the patient possible treatment for both sites, definitely for the lumbosacral spine and possibly for the right hip.     He showed understanding and agreed.  He signed consent at the time of the visit.  He did need a refill for his pain medication.  I suggested that he take Zofran as well as an antiemetic prophylactically to decrease the chance of nausea and emesis with radiation.  At this point, I did not give him steroids but that may be a consideration as well.     I spent more than 60 minutes with the patient and his daughter reviewing options for treatment, and plans to then coordinate a return visit for simulation next week.     RECOMMENDATIONS:  1.  I recommend palliative radiation therapy to primarily lumbosacral spine, T11/12 through the sacroiliac joints, 2000 cGy in 5 fractions.  2.  Consider treatment to the right hip if additional findings at the time of CT simulation.  3.  Given a prescription for oxycodone 5 mg to take half-tablet 2-3 times per day for pain relief, consider adding dexamethasone.  4.  Ondansetron 4 mg ODT every 12 hours.  5.  Return for CT simulation Tuesday 04/09/2021 with plans to begin radiation therapy soon thereafter.  6.  Continue any systemic therapy per Dr. Kem Kays after radiation.  7.  Consider reassessing response to therapy with a PSMA PET/CT scan for additional imaging in the future.       Thank you for allowing me to participate in the care of this patient's care.  Don't hesitate to call if I can help in any other way.                                                                                Kaylyn Layer, MD           Date Dictated:     04/05/2021        Date Transcribed:     04/08/2021        MK/mes   Job #: [id number]          ADDENDUM: I was  informed the patient has opted for hospice care. He will therefore not be undergoing radiation planning. I'll defer any follow up but happy to re-evaluate him for single fraction treatment if of any help.    cc:     Micheline Maze, MD       MELISSA CULLINGFORD, N.P.       Perfecto Kingdom, MD

## 2021-04-07 ENCOUNTER — Emergency Department: Admit: 2021-04-07 | Payer: MEDICARE | Primary: Family Medicine

## 2021-04-07 ENCOUNTER — Encounter

## 2021-04-07 ENCOUNTER — Encounter (HOSPITAL_BASED_OUTPATIENT_CLINIC_OR_DEPARTMENT_OTHER)

## 2021-04-07 ENCOUNTER — Other Ambulatory Visit

## 2021-04-07 ENCOUNTER — Inpatient Hospital Stay
Admission: EM | Admit: 2021-04-07 | Discharge: 2021-04-09 | Disposition: A | Discharge status: Hospice | Payer: MEDICARE | Admitting: Nephrology

## 2021-04-07 DIAGNOSIS — C61 Malignant neoplasm of prostate: Secondary | ICD-10-CM

## 2021-04-07 DIAGNOSIS — C7951 Secondary malignant neoplasm of bone: Principal | ICD-10-CM

## 2021-04-07 DIAGNOSIS — R627 Adult failure to thrive: Secondary | ICD-10-CM

## 2021-04-07 DIAGNOSIS — I509 Heart failure, unspecified: Secondary | ICD-10-CM | POA: Diagnosis not present

## 2021-04-07 DIAGNOSIS — R52 Pain, unspecified: Secondary | ICD-10-CM | POA: Diagnosis not present

## 2021-04-07 DIAGNOSIS — I110 Hypertensive heart disease with heart failure: Secondary | ICD-10-CM | POA: Diagnosis not present

## 2021-04-07 DIAGNOSIS — F419 Anxiety disorder, unspecified: Secondary | ICD-10-CM | POA: Diagnosis not present

## 2021-04-07 DIAGNOSIS — N183 Chronic kidney disease, stage 3 unspecified: Secondary | ICD-10-CM | POA: Diagnosis not present

## 2021-04-07 DIAGNOSIS — Z20822 Contact with and (suspected) exposure to covid-19: Secondary | ICD-10-CM | POA: Diagnosis not present

## 2021-04-07 DIAGNOSIS — G893 Neoplasm related pain (acute) (chronic): Secondary | ICD-10-CM | POA: Diagnosis not present

## 2021-04-07 DIAGNOSIS — Z6825 Body mass index (BMI) 25.0-25.9, adult: Secondary | ICD-10-CM | POA: Diagnosis not present

## 2021-04-07 DIAGNOSIS — R531 Weakness: Secondary | ICD-10-CM | POA: Diagnosis not present

## 2021-04-07 DIAGNOSIS — Z515 Encounter for palliative care: Secondary | ICD-10-CM | POA: Diagnosis not present

## 2021-04-07 LAB — CBC WITH DIFFERENTIAL
Basophils %: 0.4 %
Basophils Absolute: 0.02 10*3/uL (ref 0.00–0.22)
Eosinophils %: 0.6 %
Eosinophils Absolute: 0.03 10*3/uL (ref 0.00–0.50)
Hematocrit: 37.3 % (ref 37.0–53.0)
Hemoglobin: 12.3 g/dL — ABNORMAL LOW (ref 13.0–17.5)
Immature Granulocytes %: 0.2 %
Immature Granulocytes Absolute: 0.01 10*3/uL (ref 0.00–0.10)
Lymphocyte %: 23.7 %
Lymphocytes Absolute: 1.17 10*3/uL (ref 0.70–4.00)
MCH: 32.8 pg (ref 26.0–34.0)
MCHC: 33 g/dL (ref 31.0–37.0)
MCV: 99.5 fL (ref 80.0–100.0)
MPV: 10.4 fL (ref 9.1–12.4)
Monocytes %: 7.7 %
Monocytes Absolute: 0.38 10*3/uL (ref 0.38–0.83)
NRBC %: 0 % (ref 0.0–0.0)
NRBC Absolute: 0 10*3/uL (ref 0.00–2.00)
Neutrophil %: 67.4 %
Neutrophils Absolute: 3.32 10*3/uL (ref 1.50–7.95)
Platelets: 139 10*3/uL — ABNORMAL LOW (ref 150–400)
RBC: 3.75 M/uL — ABNORMAL LOW (ref 4.20–5.90)
RDW-CV: 13.8 % (ref 11.5–14.5)
RDW-SD: 50.2 fL (ref 35.0–51.0)
WBC: 4.9 10*3/uL (ref 4.0–11.0)

## 2021-04-07 LAB — TROPONIN I, HIGH SENSITIVITY: Troponin I, High Sensitivity: 24 ng/L (ref <=59)

## 2021-04-07 LAB — COMPREHENSIVE METABOLIC PANEL
ALT: 15 U/L (ref 0–55)
AST: 25 U/L (ref 6–42)
Albumin: 3.7 g/dL (ref 3.2–5.0)
Alkaline phosphatase: 526 U/L — ABNORMAL HIGH (ref 30–130)
Anion Gap: 3 mmol/L (ref 3–14)
BUN: 15 mg/dL (ref 6–24)
Bilirubin, total: 0.9 mg/dL (ref 0.2–1.2)
CO2 (Bicarbonate): 25 mmol/L (ref 20–32)
Calcium: 8.6 mg/dL (ref 8.5–10.5)
Chloride: 109 mmol/L (ref 98–110)
Creatinine: 1.29 mg/dL (ref 0.55–1.30)
Glucose: 129 mg/dL — ABNORMAL HIGH (ref 70–110)
Potassium: 4.1 mmol/L (ref 3.6–5.2)
Protein, total: 7.4 g/dL (ref 6.0–8.4)
Sodium: 137 mmol/L (ref 135–146)
eGFRcr: 54 mL/min/{1.73_m2} — ABNORMAL LOW (ref >=60)

## 2021-04-07 LAB — SARS/FLU/RSV
Influenza A RNA: NOT DETECTED
Influenza B RNA: NOT DETECTED
Respiratory syncytial virus: NOT DETECTED
SARS-CoV-2 RNA PCR: NOT DETECTED

## 2021-04-07 LAB — RAINBOW DRAW SST GOLD TOP

## 2021-04-07 LAB — TSH WITH REFLEX: TSH: 0.935 u[IU]/mL (ref 0.358–3.740)

## 2021-04-07 LAB — POCT GLUCOSE: POCT Glucose: 132 mg/dL — ABNORMAL HIGH (ref 70–110)

## 2021-04-07 LAB — MAGNESIUM: Magnesium: 2.1 mg/dL (ref 1.6–2.6)

## 2021-04-07 MED ORDER — haloperidol lactate (Haldol) injection 0.5 mg
5 | INTRAMUSCULAR | Status: DC | PRN
Start: 2021-04-07 — End: 2021-04-09

## 2021-04-07 MED ORDER — moisturizing mouth (Biotene Oral Dry Mouth) solution 2 spray
Status: DC | PRN
Start: 2021-04-07 — End: 2021-04-09

## 2021-04-07 MED ORDER — metoclopramide (Reglan) injection 10 mg
5 | Freq: Four times a day (QID) | INTRAMUSCULAR | Status: DC | PRN
Start: 2021-04-07 — End: 2021-04-09
  Administered 2021-04-08 – 2021-04-09 (×3): 10 mg via INTRAVENOUS

## 2021-04-07 MED ORDER — acetaminophen (Tylenol) suppository 650 mg
650 | RECTAL | Status: DC | PRN
Start: 2021-04-07 — End: 2021-04-09

## 2021-04-07 MED ORDER — morphine injection 2 mg
2 | INTRAMUSCULAR | Status: DC | PRN
Start: 2021-04-07 — End: 2021-04-09
  Administered 2021-04-08 – 2021-04-09 (×10): 2 mg via INTRAVENOUS

## 2021-04-07 MED ORDER — oxyCODONE (Roxicodone) immediate release tablet 5 mg
5 | Freq: Four times a day (QID) | ORAL | Status: DC | PRN
Start: 2021-04-07 — End: 2021-04-09
  Administered 2021-04-07 – 2021-04-08 (×2): 5 mg via ORAL

## 2021-04-07 MED ORDER — lactated Ringer's bolus 1,000 mL
Freq: Once | INTRAVENOUS | Status: DC
Start: 2021-04-07 — End: 2021-04-07

## 2021-04-07 MED ORDER — LORazepam (Ativan) tablet 1 mg
1 | ORAL | Status: DC | PRN
Start: 2021-04-07 — End: 2021-04-09
  Administered 2021-04-09: 04:00:00 1 mg via ORAL

## 2021-04-07 MED ORDER — hyoscyamine (Levsin) SL tablet 0.125 mg
0.125 | SUBLINGUAL | Status: DC | PRN
Start: 2021-04-07 — End: 2021-04-09

## 2021-04-07 MED ORDER — polyethylene glycol (Glycolax) packet 17 g
17 | Freq: Every day | ORAL | Status: DC | PRN
Start: 2021-04-07 — End: 2021-04-09

## 2021-04-07 MED ORDER — artificial tears ophthalmic solution 0.05 mL
Freq: Four times a day (QID) | OPHTHALMIC | Status: DC | PRN
Start: 2021-04-07 — End: 2021-04-09

## 2021-04-07 MED ORDER — ondansetron (Zofran) injection 4 mg
4 | Freq: Three times a day (TID) | INTRAMUSCULAR | Status: DC | PRN
Start: 2021-04-07 — End: 2021-04-09
  Administered 2021-04-09 (×2): 4 mg via INTRAVENOUS

## 2021-04-07 MED ORDER — acetaminophen (Tylenol) tablet 650 mg
325 | ORAL | Status: DC | PRN
Start: 2021-04-07 — End: 2021-04-09

## 2021-04-07 MED ORDER — bisacodyl (Dulcolax) suppository 10 mg
10 | Freq: Every day | RECTAL | Status: DC | PRN
Start: 2021-04-07 — End: 2021-04-09

## 2021-04-07 MED FILL — OXYCODONE 5 MG TABLET: 5 5 mg | ORAL | Qty: 1

## 2021-04-07 MED FILL — HYOSCYAMINE 0.125 MG SUBLINGUAL TABLET: 0.125 0.125 mg | SUBLINGUAL | Qty: 1

## 2021-04-07 NOTE — ED Provider Notes (Signed)
HPI   Chief Complaint   Patient presents with   . Stroke Symptoms     Presents with daughter who reports pt developed word finding difficulty at 1230 today. Hx of CVA with baseline deficits although states "This is worse" Pt appears lethargic, able to state name/location but disoriented to time. Endorses SOB. Denies pain. Moving extremities equally.        HPI     This is a 88 year old male history of metastatic cancer, on chemo and radiation, has mets to the bone of the hip and the spine who comes in for weakness per the daughter, patient has not been eating or drinking as much over the last couple of days.  Today was less energetic less responsive and had trouble with words.  Patient is a DNR/DNI.  Has pain in his back but this is not new.  Is on oxycodone for pain.  Wants no heroic measures.  No other complaints              Glasgow Coma Scale Score: 13         NIH Stroke Scale: 2                          Patient History   Past Medical History:   Diagnosis Date   . Atrial flutter (CMS/HCC)    . Bone cancer (CMS/HCC)    . CHF (congestive heart failure) (CMS/HCC)    . HTN (hypertension)    . PC (prostate cancer) (CMS/HCC)    . Stroke (CMS/HCC)      Past Surgical History:   Procedure Laterality Date   . MRA HEAD WO CONTRAST  10/02/2020    [facility] HEAD [facility] CONTRAST [facility]   . MRA NECK W AND WO CONTRAST  10/02/2020    MRA NECK W AND WO CONTRAST [facility]     No family history on file.  Social History     Tobacco Use   . Smoking status: Former     Types: Cigarettes     Quit date: 1962     Years since quitting: 61.1   . Smokeless tobacco: Never   Substance Use Topics   . Alcohol use: Not Currently   . Drug use: Never       Review of Systems   Review of Systems     Constitutional: Patient is sleepy but does follow commands and answer questions  Head: Normocephalic and atraumatic.  Right Ear: External ear normal.  Left Ear: External ear normal.  Nose: Nose normal.  Eyes: EOM are normal. Pupils are equal,  round, and reactive to light.  Neck: Normal range of motion. Neck supple. No JVD present.  Cardiovascular: Huston FoleyBrady rate, regular rhythm and normal heart sounds. ?  Pulmonary/Chest: Effort normal and breath sounds normal. No stridor. No respiratory distress. Patient has no wheezes. Patient has no rales. Patient exhibits no tenderness.  Abdominal: Soft. Patient exhibits no distension. There is no tenderness. There is no rebound and no guarding.  Musculoskeletal: No edema  Neurological: Patient is sleepy but does follow commands.  Some slight slurring of speech.    Skin: Skin is warm and dry. No rash noted. No diaphoresis. No erythema.  Psychiatric: Normal mood and affect      Physical Exam   ED Triage Vitals [04/07/21 1411]   Temp Pulse Resp BP   36.3 C (97.4 F) 59 16 134/82      SpO2  Temp Source Heart Rate Source Patient Position   97 % Oral Monitor Sitting      BP Location FiO2 (%)     Right arm --       Physical Exam  No orders to display       Labs Reviewed   POCT GLUCOSE - Abnormal       Result Value    POCT Glucose 132 (*)    CBC W/DIFF    Narrative:     The following orders were created for panel order CBC and differential.  Procedure                               Abnormality         Status                     ---------                               -----------         ------                     CBC w/ Differential[[id number]]                                                           Please view results for these tests on the individual orders.   COMPREHENSIVE METABOLIC PANEL   TROPONIN I, HIGH SENSITIVITY   CBC WITH DIFFERENTIAL - WAM AND NON-WAM       ED Course & MDM             MDM    External Records Reviewed, has not seen palliative.  Echo in July with good LVEF    This is a 88 year old male who comes in for evaluation.  After my discussion with the daughter and the patient it seems that the patient is more of a comfort approach and less aggressive measures.  After my discussion with family patient is a  DNR/DNI okay for IV fluids were gena try to hold off on painful procedures.  Based on the end-of-life wishes and goals of care this is not a patient who I would consider giving tPA 2.  I am also not convinced this is a stroke.  We will give a fluid bolus we will do a CAT scan we will do blood work.  Once the work-up is complete we will go over the risks and benefits of admission versus discharge.  I do think at some point patient would benefit talking to palliative care because after my discussion I think he would be best served with hospice as pain control is truly his most desired request and treatment    16:10 To me the elevated Alk phos is releated to bone mets.  I spoke to family and right now plan will be admitted for palliative care consult.  We will make the patient comfort measures.  Pain medicine and fluid as needed.  Report given to Dr. Doyce Para    Final diagnosis  Metastatic prostate CA     Leta Baptist, MD  04/07/21 1616

## 2021-04-08 ENCOUNTER — Encounter: Admitting: Nephrology

## 2021-04-08 ENCOUNTER — Encounter (HOSPITAL_BASED_OUTPATIENT_CLINIC_OR_DEPARTMENT_OTHER): Admitting: Internal Medicine

## 2021-04-08 ENCOUNTER — Other Ambulatory Visit (HOSPICE_FACILITY): Attending: General Practice

## 2021-04-08 ENCOUNTER — Other Ambulatory Visit

## 2021-04-08 DIAGNOSIS — C7951 Secondary malignant neoplasm of bone: Principal | ICD-10-CM

## 2021-04-08 DIAGNOSIS — R52 Pain, unspecified: Secondary | ICD-10-CM | POA: Diagnosis not present

## 2021-04-08 DIAGNOSIS — C61 Malignant neoplasm of prostate: Secondary | ICD-10-CM | POA: Diagnosis not present

## 2021-04-08 DIAGNOSIS — F419 Anxiety disorder, unspecified: Secondary | ICD-10-CM | POA: Diagnosis not present

## 2021-04-08 MED ORDER — methadone (Dolophine) tablet 2.5 mg
5 | Freq: Two times a day (BID) | ORAL | Status: DC
Start: 2021-04-08 — End: 2021-04-09
  Administered 2021-04-08 – 2021-04-09 (×2): 2.5 mg via ORAL

## 2021-04-08 MED ORDER — morphine concentrated solution 10 mg
100 | ORAL | Status: DC | PRN
Start: 2021-04-08 — End: 2021-04-09

## 2021-04-08 MED ORDER — morphine concentrated solution 20 mg
100 | ORAL | Status: DC | PRN
Start: 2021-04-08 — End: 2021-04-09

## 2021-04-08 MED FILL — METHADONE 5 MG TABLET: 5 5 mg | ORAL | Qty: 1

## 2021-04-08 MED FILL — HYOSCYAMINE 0.125 MG SUBLINGUAL TABLET: 0.125 0.125 mg | SUBLINGUAL | Qty: 1

## 2021-04-08 MED FILL — MORPHINE 2 MG/ML INJECTION WRAPPER: 2 2 mg/mL | INTRAMUSCULAR | Qty: 1

## 2021-04-08 MED FILL — SALIVA STIMULANT COMBINATION NO.3 ORAL MUCOSAL SPRAY: 2.0000 2.0000 spray | Qty: 0.4

## 2021-04-08 MED FILL — OXYCODONE 5 MG TABLET: 5 5 mg | ORAL | Qty: 1

## 2021-04-08 MED FILL — METOCLOPRAMIDE 5 MG/ML INJECTION SOLUTION: 5 5 mg/mL | INTRAMUSCULAR | Qty: 2

## 2021-04-08 NOTE — Progress Notes (Signed)
Nutrition Touchpoint:    Notified by nursing the pt is CMO status, however requesting to have Ensure with meals.  Ensure ordered TID with meals per request.  Further nutrition intervention is not warranted at this time d/t CMO status.  RD will remain available if his status changes.      Dorothyann Peng, RD

## 2021-04-08 NOTE — Progress Notes (Signed)
Spiritual Care   Name: Taysom Glymph.  MRN: [id number]  Visit Date: 04/08/2021    Visit Informaton  Clinical Encounter Type  Encounter Type: End of Life  Visited With: Patient  Reason For Visit: CMO/Care and comfort, Follow up  Is an interpreter used? : N    Observations  Patient awake in bed. Chaplain introduced self/role and provided end of life spiritual care. Patient requested Communion and Sacrament of the Anointing of the Sick. Chaplain contacted the on-call RC priest for anointing. EM Chaplain will provide Communion per request.    Assessment  Expressed Feeling of: Calm  End of Life Needs : Good byes    Belief Idenity: Catholic  Spiritual Needs: Life Transitions  Religious Needs: Ritual/sacrament  Communion: Patient requests communion  Anointing: Patient requests anointing    Family Coping: Accepting    Completed Interventions  Interventions  Interventions: Provided end of life care, Introduced spiritual care services, Provided presence/accompaniment, Provided active listening, Provided ritual//sacrament  Family Intervention: Provided family support, Assisted family with end of life care    Outcome  Awareness of Spiritual Care Services, Expressed feeling validated (seen and heard)     Plan  Plan  Continue Visiting: Yes  Next Follow-up Date: 1/31  Continue Visiting Reasons: Religious needs    Time Spent   Time Spent: 20    Darrel Reach, MDiv

## 2021-04-08 NOTE — Nursing Note (Signed)
Assumed care at 1900  Pt is CMO. Many family members at bedside. Pt sleeps on and off, endorses pain and mild nausea, PRN morphine and PRN zofran/reglan given with good effect. Pt refuses scheduled methadone because he was nauseous earlier after taking it; daughters at bedside wanted to honor patient's wish. Pt can turn himself well in bed. Bed checked and in lowest position. Calllight within reach, pt calls appropriately. Nsg continues to monitor

## 2021-04-08 NOTE — Progress Notes (Signed)
89yo with a history of metastatic prostate cancer, a/w FTT and pain, transitioned to CMO . Palliative has met with family. plan for hospice referral to discuss potential home with hospice. Medications also adjusted. Met family in room, introduced CM role.  Daughters [name] and [name] at bedside. States pt lives with [name] and pts son Deniece Portela. [name] works at night , Deniece Portela would be providing care at that time. Five steps into home. Pt sleeps on first floor. Has shower chair in home. No active services. They are agreebale to [facility] referral to discuss plan for home. May need hospital bed.   Referral placed to [facility].   Winfield Cunas RN 04/08/2021 1:48 PM

## 2021-04-08 NOTE — H&P (Addendum)
INPATIENT H&P  [facility]  [address]  [place] [id number]  [phone]    Today's Date: 04/08/2021  MRN: [id number]  Name: Tyler RhineFrederick  Jr.  DOB: 11/18/1931    Chief Complaint  Stroke Symptoms (Presents with daughter who reports pt developed word finding difficulty at 1230 today. Hx of CVA with baseline deficits although states "This is worse" Pt appears lethargic, able to state name/location but disoriented to time. Endorses SOB. Denies pain. Moving extremities equally. )     ED team initial take: This is a 88 year old male history of metastatic cancer, on chemo and radiation, has mets to the bone of the hip and the spine who comes in for weakness per the daughter, patient has not been eating or drinking as much over the last couple of days.  Today was less energetic less responsive and had trouble with words.  Patient is a DNR/DNI.  Has pain in his back but this is not new.  Is on oxycodone for pain.  Wants no heroic measures.  No other complaints    History Of Present Illness  Tyler RhineFrederick  Jr. is a 88 y.o. male presenting with what seems to be an acute neurologic deficit with difficulty finding words.  Patient does have a history of previous CVA.  He has a component of altered mental status.    Patient seen and examined in the emergency department.  The situation was extensively discussed with the daughter.  The patient is DNR/DNI.  He has been admitted to CMO.  There is well-tolerated from the emergency department.  I completed a CMO set of orders.    Patient seen and examined in the morning.  He is comfortable.  Everybody in agreement with the present plan.    Past Medical History  He has a past medical history of Atrial flutter (CMS/HCC), Bone cancer (CMS/HCC), CHF (congestive heart failure) (CMS/HCC), HTN (hypertension), PC (prostate cancer) (CMS/HCC), and Stroke (CMS/HCC).     Surgical History  He has a past surgical history that includes MRA HEAD WO CONTRAST (10/02/2020) and MRA  NECK W AND WO CONTRAST (10/02/2020).     Social History  He reports that he quit smoking about 61 years ago. His smoking use included cigarettes. He has never used smokeless tobacco. He reports that he does not currently use alcohol. He reports that he does not use drugs.     Family History  No family history on file.     Code status: DNR    Allergies  Patient has no known allergies.    Current Outpatient Medications   Medication Instructions   . acetaminophen (TYLENOL) 650 mg, oral, Every 6 hours PRN   . apixaban (ELIQUIS) 2.5 mg, oral, 2 times daily   . atorvastatin (LIPITOR) 80 mg, oral, Nightly   . calcium carbonate-vitamin D3 500 mg-5 mcg (200 unit) tablet 1 tablet, oral, Daily   . cyanocobalamin, vitamin B-12, (VITAMIN B-12 ORAL) Daily   . gabapentin (NEURONTIN) 600 mg, oral, 3 times daily PRN   . hydrALAZINE (APRESOLINE) 25 mg, oral, 2 times daily   . losartan (COZAAR) 50 mg, oral, Daily   . melatonin 3 mg tablet oral, Nightly   . metoprolol tartrate (LOPRESSOR) 12.5 mg, oral, 2 times daily, Takes 12.5 per heart over 60 and BP top # is over 110   . omega-3 (Fish Oil) 300-1,000 mg capsule 1,000 mg, oral, 3 times weekly   . ondansetron (ZOFRAN) 4 mg, oral, Every 12 hours PRN   .  oxyCODONE (ROXICODONE) 5 mg, oral, Every 6 hours PRN, Can take 0.5 tablet every 6 hours if better tolerated.   . pantoprazole (PROTONIX) 40 mg, oral, Daily before breakfast, Do not crush, chew, or split.    . Phosphorous Supplement 280-160-250 mg packet oral   . traZODone (DESYREL) 50 mg, oral, Nightly      IN-HOUSE MEDS:      PRN Meds: PRN medications: acetaminophen **OR** acetaminophen, artificial tears, bisacodyl, haloperidol lactate, hyoscyamine, LORazepam, metoclopramide, moisturizing mouth, morphine, ondansetron, oxyCODONE, polyethylene glycol    Review of Systems   Patient with chronic failure to thrive, negative otherwise noted upon examination    Physical Exam   Blood pressure (!) 148/71, pulse 62, temperature 36.6 C (97.8 F),  temperature source Oral, resp. rate 20, height 1.702 m, weight 72.6 kg, SpO2 98 %. Body mass index is 25.06 kg/m.  Awake alert and conversational.  No apparent distress.  No JVD.  Lungs were clear.  Heart regular rate and rhythm.  No murmur rubs or gallops.  Abdomen benign nontender no masses.  Extremities no clubbing cyanosis or edema    Relevant Lab Results  Lab Results   Component Value Date    GLUCOSE 129 (H) 04/07/2021    NA 137 04/07/2021    K 4.1 04/07/2021    CO2 25 04/07/2021    CL 109 04/07/2021    BUN 15 04/07/2021    CREATININE 1.29 04/07/2021    CALCIUM 8.6 04/07/2021    ALBUMIN 3.7 04/07/2021    PHOS 2.2 (L) 03/13/2021    TROPIHS 24 04/07/2021    AST 25 04/07/2021    ALT 15 04/07/2021    BILITOT 0.9 04/07/2021    ALKPHOS 526 (H) 04/07/2021    FERRITIN 223.9 09/03/2020    INR <1.00 10/02/2020    HGBA1C 5.5 10/03/2020    HGBA1C 5.5 10/03/2020    CHOL 208 (H) 10/03/2020    TRIG 197 (H) 10/03/2020     Lab Results   Component Value Date    WBC 4.9 04/07/2021    HGB 12.3 (L) 04/07/2021    HCT 37.3 04/07/2021    MCV 99.5 04/07/2021    PLT 139 (L) 04/07/2021       XR RESULTS: Lingular consolidation may reflect atelectasis and/or pneumonia.  Marlowe Kays, MD 10/08/2020 6:59 PM    ASSESSMENT AND PLAN:  Principal Problem:    Prostate cancer (CMS/HCC)    Past Medical History:  1.  Metastatic prostatic carcinomatosis  2.  DNR/DNI  3.  L2 compression fracture  4.  CVA history  5.  Pneumonia history  6.  Sinus bradycardia  7.  Atrial flutter, cardioverted in 2017  8.  History of chemotherapy    Assessment and Plan:   #1  General malaise  #2  Question of TIA  #3  Seeking care and comfort  #4  Patient is CMO now    DISCUSSION:  Tyler Galindo. is a 88 y.o. male admitted from the emergency department with the following chief complaint of Stroke Symptoms (Presents with daughter who reports pt developed word finding difficulty at 1230 today. Hx of CVA with baseline deficits although states "This is worse" Pt  appears lethargic, able to state name/location but disoriented to time. Endorses SOB. Denies pain. Moving extremities equally. ). The patient is initially seen in triage and later by the emergency department physician. The initial working diagnosis is Prostate cancer (CMS/HCC) [C61].    The initial work up shows a patient  who is essentially terminal from a prostatic cancer point of view.  Seen in the emergency department after a short episode of word finding difficulties.  Apparently somehow lethargic and disoriented.  Further evaluation emergency department showed some male of extreme age with metastatic carcinomatosis and the conversation with the family from the emergency department team list to CMO status.  Orders written in the emergency department by the emergency department physician.  For CMO orders written.    Patient seen and examined this morning.  He is conversational.  He feels better.  He has a tray at his bedside.  Case briefly discussed with palliative care.  It seems reasonable to pursue hospice measures even at home.  Case discussed with primary nurse.  COC to be involved.  He is on morphine which seems to be working well for him    As a result the initial treatment includes admission, CMO orders, morphine for pain relief and comfort    Initial presentation most consistent with advanced disease and the patient will wishes to pursue no further diagnostic or therapeutic maneuvers    Supporting lab work includes the following:   CBC shows essentially normal values  Chem 7 shows quite acceptable values  Other remarkable labs include elevated alkaline phosphatase    Further work up and elaboration includes a patient with no comfortable.  We will move in the direction of hospice and perhaps a hospice home    The patient is expected to need inpatient services for more than two midnights.    The patient is not a diabetic, as a result capillary blood sugars and sliding scale not been utilized.    DVT and GI  prophylaxis ordered in accordance with the patient's clinical condition.    The patient's code status is DO NOT RESUSCITATE    I certify that hospital inpatient services are reasonable and medically necessary. They are appropriately provided as inpatient services in accordance with the two midnight benchmark under 42CFR 412 3(e), or the services are specified as inpatient only procedure under 42 CFR 419 22(n).    Note dictated on Dragon, not reviewed line by line. If you find any typos or issues; or have any questions please text or call me at [phone]. It will be appreciated.     Benay Pike, MD

## 2021-04-08 NOTE — Consults (Signed)
Palliative Care Consult    History Of Present Illness   Mr. Windell Hummingbirdilato is an 88yo with a history of metastatic prostate cancer, a/w FTT and pain, transitioned to CMO after d/w ED provider. Palliative care has been consulted to assist in reviewing medical goals of care, to assist in managing symptoms, and to provide support to the patient and family.      Past Medical History  Past Medical History:   Diagnosis Date   . Atrial flutter (CMS/HCC)    . Bone cancer (CMS/HCC)    . CHF (congestive heart failure) (CMS/HCC)    . HTN (hypertension)    . PC (prostate cancer) (CMS/HCC)    . Stroke (CMS/HCC)        Surgical History   has a past surgical history that includes MRA HEAD WO CONTRAST (10/02/2020) and MRA NECK W AND WO CONTRAST (10/02/2020).     Social History   reports that he quit smoking about 61 years ago. His smoking use included cigarettes. He has never used smokeless tobacco. He reports that he does not currently use alcohol. He reports that he does not use drugs. Pt is documented as widowed, he had 8 children, several of whom have passed away. He lives with his daughter Zella BallRobin.     PPS: 40/100% PPS prior to hospitalization 50/100%     PCP: Dot BeenMARIA LIM, MD    HCP: on file, daughter Okey RegalCarol    Code Status: DNR/I    Palliative Care Discussion: Visit held at the bedside with the patient who is awake and alert although admits that he is feeling extremely poorly and is discouraged by his ongoing challenges in regards to pain.  I explained my role within the medical team and discussed the considerations to adjust some of his medications for better pain management.  He is appreciative and seems very focused on improved pain management. He asked me to call his daughter Zella BallRobin, to discuss medications.    Call placed to Robin to introduce my role.  She has a general understanding of palliative care as her dad briefly met with our outpatient provider just prior to needing hospitalization over the Summer.  She shares that her dad does  not wish to go through any further treatments or interventions.  She states that just a few days ago they met with the radiation oncologist with the plan to start palliative radiation but her dad has since decided that he does not wish to initiate radiation and wants to focus on comfort care for the remainder of his life.  I explained that in the hospital we are not treating any acute medical problems, but will work on prioritizing symptom management.  I also explained that based on my assessment I felt it would be reasonable that her dad could likely transition somewhere to receive hospice care and she seems to feel that her dad would want to go home and is welcoming of a hospice referral to gather more information.    We discussed his symptoms and she explained that her dad was previously on fentanyl patch but this was stopped [place] of [place].  Recently he was receiving oxycodone with minimal relief.  I discussed the consideration of starting low-dose methadone in addition to continued morphine for breakthrough pain.  She welcomes any recommendations to assist in managing his discomfort.    Allergies  Patient has no known allergies.    Medications  Facility-Administered Medications Prior to Admission  Medication Dose Route Frequency Provider Last Rate Last Admin   . ondansetron ODT (Zofran-ODT) disintegrating tablet 4 mg  4 mg oral q12h [facility] Kaylyn Layer, MD         Medications Prior to Admission   Medication Sig Dispense Refill Last Dose   . acetaminophen (Tylenol) 325 mg capsule Take 650 mg by mouth every 6 (six) hours if needed for pain score 1-3.   Past Week   . apixaban (Eliquis) 2.5 mg tablet Take 1 tablet (2.5 mg) by mouth in the morning and at bedtime. (Patient taking differently: Take 5 mg by mouth in the morning and at bedtime.) 180 tablet 3 Past Week   . atorvastatin (Lipitor) 80 mg tablet Take 1 tablet (80 mg) by mouth at bedtime. 30 tablet 11 Past Week   . calcium  carbonate-vitamin D3 500 mg-5 mcg (200 unit) tablet Take 1 tablet by mouth in the morning.   Past Week   . cyanocobalamin, vitamin B-12, (VITAMIN B-12 ORAL) 1 (one) time each day.   Past Week   . gabapentin (Neurontin) 600 mg tablet Take 600 mg by mouth if needed in the morning, at noon, and at bedtime.   Past Week   . hydrALAZINE (Apresoline) 25 mg tablet Take 25 mg by mouth in the morning and at bedtime.   Past Week   . losartan (Cozaar) 50 mg tablet Take 50 mg by mouth in the morning.   Past Week   . melatonin 3 mg tablet Take by mouth at bedtime.   Past Week   . metoprolol tartrate (Lopressor) 25 mg tablet Take 12.5 mg by mouth in the morning and at bedtime. Takes 12.5 per heart over 60 and BP top # is over 110   Past Week   . omega-3 (Fish Oil) 300-1,000 mg capsule Take 1,000 mg by mouth 3 (three) times a week.   Past Week   . ondansetron (Zofran) 4 mg tablet Take 1 tablet (4 mg) by mouth every 12 (twelve) hours if needed for nausea or vomiting. 20 tablet 0 Past Week   . oxyCODONE (Roxicodone) 5 mg immediate release tablet Take 1 tablet (5 mg) by mouth every 6 (six) hours if needed for pain score 7-10. Can take 0.5 tablet every 6 hours if better tolerated. 30 tablet 0 Past Week   . pantoprazole (ProtoNix) 40 mg EC tablet Take 40 mg by mouth before breakfast. Do not crush, chew, or split.   Past Week   . Phosphorous Supplement 280-160-250 mg packet Take by mouth.      . traZODone (Desyrel) 50 mg tablet Take 50 mg by mouth at bedtime.   Past Week       Review of Systems  Pt feels poorly, cannot articulate.      Physical Exam  General: fair appearing male in NAD.  HEENT:  head atraumatic, moist mucous membranes  Resp: RR regular  CV: regular  GI: SNT, +BS  Skin: warm, dry  MS: No acute abnormalities  Neuro: Alert, oriented, forgetful, able to follow commands  Psych: calm, cooperative     Last Recorded Vitals  Blood pressure (!) 148/71, pulse 62, temperature 36.6 C (97.8 F), temperature source Oral, resp. rate  20, height 1.702 m, weight 72.6 kg, SpO2 98 %.    Impression/Plan: Mr. Windell Hummingbird is an 88yo with a history of metastatic prostate cancer, a/w FTT and pain, transitioned to CMO after d/w ED provider.     Illness Understanding: pt/family have a reasonable understanding  of his medical challenges and expectation for further decline.     Goals of Care: pt/family desire end of life care and pt remains CMO in the hospital with plan for hospice referral to discuss potential home with hospice.     Symptom Management:    Pain: will add methadone 2.5mg  PO q12hours and c/w morphine for breakthrough pain. Will add roxanol 10-20mg  po q4h prn in addition to IV   Anxiety: prn ativan available   Constipation risk: a/w mirilax daily and prn dulcolax    Advanced Directives: HCP on file, DNR    Support/Coping Skills: adequate    Thank you for this consultation. Consult discussed with nursing staff and primary medical team. Greater than 50% of a 70 minute visit was spent in counseling and care coordination as documented above.

## 2021-04-08 NOTE — Progress Notes (Signed)
Spiritual Care   Name: Jameison Haji.  MRN: [id number]  Visit Date: 04/08/2021    Visit Informaton  Clinical Encounter Type  Encounter Type: End of Life  Visited With: Family/Visitor  Reason For Visit: CMO/Care and comfort  Is an interpreter used? : N    Observations  Patient resting quietly in bed. Chaplain called NOK daughter [name] and spoke onthe phone. [name] shared that patient is Catholic and would want to receive Communion and Sacrament of the Anointing of the Sick. Chaplain will follow up with patient when awake and contact the on-call priest for anointing.    Assessment       Spiritual Needs: Life Transitions  Religious Needs: Ritual/sacrament, Prayer  Communion: Patient requests communion  Anointing: Family requests anointing    Family Coping: Accepting    Completed Interventions  Interventions  Interventions: Provided end of life care  Family Intervention: Provided family support, Assisted family with end of life care    Outcome  Awareness of Spiritual Care Services, Expressed feeling validated (seen and heard)     Plan  Plan  Continue Visiting: Yes  Next Follow-up Date: 1/31  Continue Visiting Reasons: Religious needs    Time Spent   Time Spent: 15    Darrel Reach, MDiv

## 2021-04-08 NOTE — Nursing Note (Signed)
ED admit. Pt comes to the floor at approx. 2230 with his daughter Zella BallRobin. Pt was sleepy but easily arousable and able to follow commands. His daughter answers most of admission questions, she knows him well since he lives with her. Per daughter, pt does have some mild dysphagia from his CVA but able to eat as long as food is easy to chew/swallow and able to drink thin liquid; however, she does have poor appetite. At baseline, pt walks with cane. Belongings: cane and upper denture (in mouth). Pt has prn morphine for pain. Pt calls appropriately. Bed checked and in lowest position. Call light within reach. Nsg continues to monitor

## 2021-04-08 NOTE — Nursing Note (Incomplete)
Assumed care at 1900  Pt is CMO. Many family members at bedside. Pt sleeps on and off, endorses pain and mild nausea, PRN morphine and PRN Zofran given with good effect. Pt refuses scheduled methadone because he was nausea

## 2021-04-08 NOTE — Progress Notes (Signed)
Is the patient appropriate for Home Care?     Homebound Status: ***  Covid status: ***  Insurance carrier: Payor: [employer] MEDICARE REPLACEMENT / Plan: [employer] MEDICARE / Product Type: *No Product type* /     Date of potential discharge: ***    Is there a next day need?     Does the patient have a PCP?     Is the patient a [facility] patient?    Does patient use Home O2    Have your cordinated with the IP care team on the patients IV?   Have your coordinated with the IP care team on the patients Drains?   Have your coordinated with the IP care team on the patients Wounds?

## 2021-04-08 NOTE — Progress Notes (Signed)
Spiritual Care   Name: Tyler RhineFrederick  Jr.  MRN: [id number]  Visit Date: 04/08/2021    Visit Informaton  Clinical Encounter Type  Encounter Type: Routine  Visited With: Patient  Reason For Visit: Follow up (patient requested for holy communion)  Is an interpreter used? : N    Observations  Patient was lying in bed at the time of visit. He received holy communion.    Assessment  Expressed Feeling of: Thankfulness    Belief Idenity: Catholic  Communion: Patient requests communion    Completed Interventions  Interventions  Interventions: Provided ritual//sacrament  Communion Given Indicator  : Yes    Outcome  Awareness of Spiritual Care Services, Reported spiritual needs are met     Plan  Plan  Continue Visiting: Yes  Next Follow-up Date: 04/09/2021  Continue Visiting Reasons: Religious needs    Time Spent   Time Spent: 5    Ian MalkinAgnes Sekai [place]lin

## 2021-04-09 ENCOUNTER — Encounter
Admit: 2021-04-09 | Discharge: 2021-04-12 | Disposition: A | Discharge status: Hospice | Payer: MEDICARE | Source: Ambulatory Visit | Attending: Nephrology | Admitting: Nephrology

## 2021-04-09 ENCOUNTER — Encounter (HOSPITAL_BASED_OUTPATIENT_CLINIC_OR_DEPARTMENT_OTHER): Admitting: Internal Medicine

## 2021-04-09 ENCOUNTER — Encounter: Admitting: Family

## 2021-04-09 ENCOUNTER — Telehealth

## 2021-04-09 ENCOUNTER — Encounter (HOSPICE_FACILITY)

## 2021-04-09 ENCOUNTER — Other Ambulatory Visit (HOSPICE_FACILITY)

## 2021-04-09 DIAGNOSIS — C7951 Secondary malignant neoplasm of bone: Secondary | ICD-10-CM

## 2021-04-09 DIAGNOSIS — C800 Disseminated malignant neoplasm, unspecified: Principal | ICD-10-CM

## 2021-04-09 DIAGNOSIS — F419 Anxiety disorder, unspecified: Secondary | ICD-10-CM | POA: Diagnosis not present

## 2021-04-09 DIAGNOSIS — C61 Malignant neoplasm of prostate: Secondary | ICD-10-CM | POA: Diagnosis not present

## 2021-04-09 DIAGNOSIS — R52 Pain, unspecified: Secondary | ICD-10-CM | POA: Diagnosis not present

## 2021-04-09 MED ORDER — ondansetron (Zofran) injection 4 mg
4 | Freq: Four times a day (QID) | INTRAMUSCULAR | Status: DC | PRN
Start: 2021-04-09 — End: 2021-04-12
  Administered 2021-04-10: 02:00:00 4 mg via INTRAVENOUS

## 2021-04-09 MED ORDER — metoclopramide (Reglan) injection 10 mg
5 | Freq: Four times a day (QID) | INTRAMUSCULAR | Status: DC
Start: 2021-04-09 — End: 2021-04-09

## 2021-04-09 MED ORDER — methadone (Dolophine) tablet 2.5 mg
5 | Freq: Two times a day (BID) | ORAL | Status: DC | PRN
Start: 2021-04-09 — End: 2021-04-10

## 2021-04-09 MED ORDER — hyoscyamine (Levsin) SL tablet 125 mcg
0.125 | SUBLINGUAL | Status: DC | PRN
Start: 2021-04-09 — End: 2021-04-12

## 2021-04-09 MED ORDER — metoclopramide (Reglan) injection 10 mg
5 | Freq: Four times a day (QID) | INTRAMUSCULAR | Status: DC | PRN
Start: 2021-04-09 — End: 2021-04-12
  Administered 2021-04-10 – 2021-04-11 (×4): 10 mg via INTRAVENOUS

## 2021-04-09 MED ORDER — LORazepam (Ativan) tablet 1 mg
1 | ORAL | Status: DC | PRN
Start: 2021-04-09 — End: 2021-04-12
  Administered 2021-04-10 – 2021-04-12 (×8): 1 mg via ORAL

## 2021-04-09 MED ORDER — morphine injection 2 mg
2 | INTRAMUSCULAR | Status: DC | PRN
Start: 2021-04-09 — End: 2021-04-12
  Administered 2021-04-09 – 2021-04-12 (×21): 2 mg via INTRAVENOUS

## 2021-04-09 MED FILL — METHADONE 5 MG TABLET: 5 5 mg | ORAL | Qty: 1

## 2021-04-09 MED FILL — HYOSCYAMINE 0.125 MG SUBLINGUAL TABLET: 0.125 0.125 mg | SUBLINGUAL | Qty: 1

## 2021-04-09 MED FILL — MORPHINE 2 MG/ML INJECTION WRAPPER: 2 2 mg/mL | INTRAMUSCULAR | Qty: 1

## 2021-04-09 MED FILL — ONDANSETRON HCL (PF) 4 MG/2 ML INJECTION SOLUTION: 4 4 mg/2 mL | INTRAMUSCULAR | Qty: 2

## 2021-04-09 MED FILL — METOCLOPRAMIDE 5 MG/ML INJECTION SOLUTION: 5 5 mg/mL | INTRAMUSCULAR | Qty: 2

## 2021-04-09 MED FILL — LORAZEPAM 1 MG TABLET: 1 1 mg | ORAL | Qty: 1

## 2021-04-09 NOTE — Progress Notes (Signed)
89yo with a history of metastatic prostate cancer, a/w FTT and pain, transitioned to CMO . Palliative has met with family. plan for hospice referral to discuss potential home with hospice. Medications also adjusted. Met family in room, introduced CM role.  Daughters Zella Ballobin and Okey RegalCarol at bedside. States pt lives with Zella BallRobin and pts son Deniece PortelaWayne. Zella BallRobin works at night , Deniece PortelaWayne would be providing care at that time. Five steps into home. Pt sleeps on first floor. Has shower chair in home. No active services. [facility]. May need hospital bed.   [facility]   Winfield CunasStephanie Teghan Philbin RN 04/08/2021 1:48 PM     1/31 [facility] to meet with family today at 11am. Winfield CunasStephanie Hattye Siegfried RN 04/09/2021 9:01 AM     [facility] 11[facility]55 1/31 signed on to GIP. Goal to manage symptoms here in hospital with transition home in few days , family is  agreeable to this plan. [facility] to order DME, CM to touch base with liaison prior to dc . Admitting and COC clinical lead aware.   Winfield CunasStephanie Halil Rentz RN 04/09/2021 12:18 PM

## 2021-04-09 NOTE — Progress Notes (Signed)
GIP LEVEL OF CARE.  ATTEMPTS TO MANAGE THE PATIENT'S ESCALATING SYMPTOMS IN THE CURRENT SETTING OVER THE PAST 24-48 HOURS HAVE FAILED TO ACHIEVE THE DESIRED LEVEL OF COMFORT/SYMPTOM CONTROL.  THE PATIENT REQUIRES FREQUENT RN/NP/MD ASSESSMENT, PLAN OF CARE ADJUSTMENT AND SUBSEQUENT TITRATION OF MEDICATIONS IN AN INPATIENT SETTING TO CONTROL SYMPTOMS.    SKILLED SERVICES ASSESSMENT FOR BREAKTHROUGH/UNCONTROLLED SYMPTOMS PRESENT:  [x] UNCONTROLLED PAIN  [] SEVERE AGITATION/RESTLESSNESS OR ANXIETY  [] UNMANAGEABLE DYSPNEA/RESPIRATORY DISTRESS  [x] NAUSEA/VOMITING  [] EXCESSIVE SECRETIONS  [] UNCONTROLLED BLEEDING  [] SEIZURES  [] WOUND CARE  [x] INJECTABLE MEDICATION MANAGEMENT  [] OTHER:     88 year old male with metastatic prostate cancer to the bone. Now with no further plan for disease directed treatment. Wish to return home with hospice when able. Was initiated on Methadone 2.5 mg PO BID but difficulty tolerating due to nausea and vomiting. Has required Morphine 2 mg IV x 6 in the past 24 hours for management of pain. IV Zofran and reglan prn for management of nausea. GIP level of care for management of pain, nausea with use of IV medication. Patient and family with wish to return home if able to have symptom palliation on oral medication regimen.   PLAN:  DAILY MEDICAL ASSESSMENT TO MANAGE PLAN OF CARE AND TO ASSESS ONGOING NEED FOR GIP LEVEL OF CARE.

## 2021-04-09 NOTE — Telephone Encounter (Signed)
04/09/21    Tw spoke to pt dtr/HCP Robin re hospice referral; Zella BallRobin reports pt has been in pain dt mets cancer and has been receiving IV meds but will be changing to oral meds to transition home w hospice to be cared for by family    Zella BallRobin and sister Okey RegalCarol [facility]/31 to discuss plan

## 2021-04-09 NOTE — Consults (Signed)
Palliative Care Follow Up    Subjective:  [name] is an 88yo with a history of metastatic prostate cancer, a/w FTT and pain, transitioned to CMO after d/w ED provider.     [name] is seen this morning sleeping soundly with an untouched breakfast tray at his bedside. D/w nursing who indicate pt refused to take second dose of methadone overnight, and has been relying on IV morphine for pain relief, due to concern for nausea. Nursing provided education to pt and family overnight and feel that today he will accept methadone and will work on utilizing PO medications for pain, as they will provide longer pain relief. Hospice also planning to meet with family today    Medications:  methadone, 2.5 mg, oral, q12h         PRN medications: acetaminophen **OR** acetaminophen, artificial tears, bisacodyl, haloperidol lactate, hyoscyamine, LORazepam, metoclopramide, moisturizing mouth, morphine, morphine, morphine, ondansetron, oxyCODONE, polyethylene glycol    Review of Systems  Review of symptoms unable to be performed due to patients condition.      Physical Exam  nad  resp nonlabored  Skin warm and dry     Last Recorded Vitals  Blood pressure (!) 148/71, pulse 62, temperature 36.6 C (97.8 F), temperature source Oral, resp. rate 20, height 1.702 m, weight 72.6 kg, SpO2 98 %.    Assessment and Plan:  [name] is an 88yo with a history of metastatic prostate cancer, a/w FTT and pain, transitioned to CMO after d/w ED provider.     Goals of care:  Pt remains CMO, plan for hospice meeting with family today.     Symptoms:     Pain: c/w methadone 2.5mg  PO q12hours and prn morphine. Encourage use of PO medications for pain.    Anxiety: prn ativan available              Constipation risk: a/w mirilax daily and prn dulcolax    Advanced directives: HCP on file, DNR    Thank you for this consultation. Consult discussed with nursing staff and primary medical team. Greater than 50% of a 25 minute visit was spent in counseling and care  coordination as documented above.

## 2021-04-09 NOTE — Nursing Note (Signed)
Patient reports no nausea after taking methadone this afternoon for pain. Able to tolerate eggs and ensure. Family present at bedside

## 2021-04-09 NOTE — Progress Notes (Signed)
89yo with a history of metastatic prostate cancer, a/w FTT and pain, transitioned to CMO . Palliative has met with family. plan for hospice referral to discuss potential home with hospice. Medications also adjusted. Met family in room, introduced CM role.  Daughters [name] and [name] at bedside. States pt lives with [name] and pts son Deniece Portela. [name] works at night , Deniece Portela would be providing care at that time. Five steps into home. Pt sleeps on first floor. Has shower chair in home. No active services. They are agreebale to [facility] referral to discuss plan for home. May need hospital bed.   Referral placed to [facility].   Winfield Cunas RN 04/08/2021 1:48 PM     1/31 [facility] to meet with family today at 11am. Winfield Cunas RN 04/09/2021 9:01 AM

## 2021-04-09 NOTE — Hospice (Signed)
[name]  IS A 88 YEAR OLD MALE SEEN FOR HOSPICE GIP ADMISSION AT [facility]. HE WAS ADMITTED TO THE HOSPITAL ON 1/29 WITH LETHARGY AND WORD FINDING. HE HAS A HISTORY OF METASTATIC PROSTATE CANCER WITH METS TO THE BONES.  OTHER NOTABLE HISTORY INCLUDES A FLUTTER AND CVA. HE WAS ADMITTED TO THE HOSPITAL AS CMO WITH PLAN TO TRANSITION HOME WITH HOSPICE.   THIS RN MET WITH [name]'S DAUGHTER'S [name] AND [name] AT THE BEDSIDE. DISCUSSED HOSPICE CARE PLAN, LEVELS OF CARE AND PHILOSOPHY. [name] DID NOT APPEAR TO BE IN ANY DISTRESS AND HE REPORTED HIS PAIN TO BE 8/10. ON ROOM AIR. HE WAS ABLE TO EAT SOME EGGS FOR BREAKFAST. [name] WAS STARTED ON 2.5MG  OF METHADONE YESTERDAY BUT HAD NAUSEA/VOMITING AFTER THE FIRST DOSE. HE REFUSED THE SECOND DOSE. HE HAS REQUIRED 2MG  IV MORPHINE X 6 IN THE LAST 24 HOURS FOR PAIN RELIEF. [name] IS APPROVED FOR GIP BY [facility] FOR MANAGEMENT OF PAIN WITH A DIAGNOSIS OF METASTATIC PROSTATE CANCER. [name] AND HIS DAUGHTER'S [name] TO TRANSITION HIM HOME WITH HOSPICE. NO ADDITIONAL MEDICATION RECOMMENDATIONS PER ANNA. GOAL WILL BE TO TRY TO TAKE METHADONE AGAIN AND USE PRN ANTIEMETICS AND MORPHINE.   [name] HAS BEEN BEDBOUND SINCE ADMISSION. PPS 40%. PURWICK IN PLACE. SKIN INTACT. ON ROOM AIR. HE WAS AGREEABLE TO GIP ADMISSION AT [facility]. CONSENTS SIGNED. DME WILL NEED TO BE ORDERED WHEN D/C DATE IS ESTABLISHED. FAMILY GIVEN [facility] HANDBOOK AND EDUCATED ON TRIAGE NUMBER. CM, PRIMARY RN, AND MD MADE AWARE OF GIP ADMISSION.

## 2021-04-09 NOTE — Progress Notes (Signed)
[facility]  SEP MED PROGRESS NOTE  Hospital Day: 3    Today's Date: 04/09/2021  MRN: [id number]  Name: [name].  DOB: Mar 23, 1931    SUBJECTIVE  Feeling better    OBJECTIVE  Looking better    PHYSICAL EXAM  Blood pressure (!) 148/71, pulse 62, temperature 36.6 C (97.8 F), temperature source Oral, resp. rate 20, height 1.702 m, weight 72.6 kg, SpO2 98 %.  Awake and alert conversational.  No JVD.  Lungs rather clear.  Heart regular rate and rhythm.  Abdomen benign.  Extremities no edema    Intake/Output Summary (Last 24 hours) at 04/09/2021 1136  Last data filed at 04/09/2021 0300  Gross per 24 hour   Intake --   Output 175 ml   Net -175 ml     Body mass index is 25.06 kg/m.     CURRENT INPATIENT MEDS:  methadone, 2.5 mg, oral, q12h       PRN Meds:   PRN medications: acetaminophen **OR** acetaminophen, artificial tears, bisacodyl, haloperidol lactate, hyoscyamine, LORazepam, metoclopramide, moisturizing mouth, morphine, morphine, morphine, ondansetron, oxyCODONE, polyethylene glycol      RECENT LABS  Lab Results   Component Value Date    GLUCOSE 129 (H) 04/07/2021    NA 137 04/07/2021    K 4.1 04/07/2021    CO2 25 04/07/2021    CL 109 04/07/2021    BUN 15 04/07/2021    CREATININE 1.29 04/07/2021    CALCIUM 8.6 04/07/2021    ALBUMIN 3.7 04/07/2021    PHOS 2.2 (L) 03/13/2021    TROPIHS 24 04/07/2021    AST 25 04/07/2021    ALT 15 04/07/2021    BILITOT 0.9 04/07/2021    ALKPHOS 526 (H) 04/07/2021    FERRITIN 223.9 09/03/2020    INR <1.00 10/02/2020    HGBA1C 5.5 10/03/2020    HGBA1C 5.5 10/03/2020    PSA 117.75 (H) 03/13/2021     Lab Results   Component Value Date    WBC 4.9 04/07/2021    HGB 12.3 (L) 04/07/2021    HCT 37.3 04/07/2021    PLT 139 (L) 04/07/2021       DAILY A/P:  [name] is a 88 y.o. male admitted with Stroke Symptoms (Presents with daughter who reports pt developed word finding difficulty at 1230 today. Hx of CVA with baseline deficits although states "This is worse" Pt appears  lethargic, able to state name/location but disoriented to time. Endorses SOB. Denies pain. Moving extremities equally. )  Working list of problems include Prostate cancer (CMS/HCC) [C61]     The patient  has a past medical history of Atrial flutter (CMS/HCC), Bone cancer (CMS/HCC), CHF (congestive heart failure) (CMS/HCC), HTN (hypertension), PC (prostate cancer) (CMS/HCC), and Stroke (CMS/HCC).    Problem List Items Addressed This Visit          Genitourinary    * (Principal) Prostate cancer (CMS/HCC) - Primary        Overall clinical status is stable, resting comfortably  Hemodynamically stable and afebrile   Mentation wise the patient is acceptable  CV: BP not measured as he is CMO  Renal: No further labs  Heme: No further labs  Pulm: No new complaints  Consults: Followed by palliative care and hospice service  Clinical course is stable  Plan: Continue present plan.  At the end of the day patient transition to GIP  Discussed with nursing in rounds this morning    Benay Pike, MD

## 2021-04-10 ENCOUNTER — Encounter (HOSPITAL_BASED_OUTPATIENT_CLINIC_OR_DEPARTMENT_OTHER): Admitting: Internal Medicine

## 2021-04-10 ENCOUNTER — Other Ambulatory Visit (HOSPICE_FACILITY)

## 2021-04-10 ENCOUNTER — Encounter: Payer: MEDICARE | Primary: Family Medicine

## 2021-04-10 DIAGNOSIS — R627 Adult failure to thrive: Secondary | ICD-10-CM | POA: Diagnosis not present

## 2021-04-10 DIAGNOSIS — N183 Chronic kidney disease, stage 3 unspecified: Secondary | ICD-10-CM | POA: Diagnosis not present

## 2021-04-10 DIAGNOSIS — C61 Malignant neoplasm of prostate: Secondary | ICD-10-CM | POA: Diagnosis not present

## 2021-04-10 MED ORDER — acetaminophen (Tylenol) tablet 650 mg
325 | ORAL | Status: DC | PRN
Start: 2021-04-10 — End: 2021-04-12
  Administered 2021-04-12: 08:00:00 650 mg via ORAL

## 2021-04-10 MED ORDER — acetaminophen (Tylenol) suppository 650 mg
650 | RECTAL | Status: DC | PRN
Start: 2021-04-10 — End: 2021-04-12

## 2021-04-10 MED ORDER — fentaNYL (Duragesic) 12 mcg/hr 1 patch
12 | TRANSDERMAL | Status: DC
Start: 2021-04-10 — End: 2021-04-12
  Administered 2021-04-10: 19:00:00 1 via TRANSDERMAL

## 2021-04-10 MED FILL — MORPHINE 2 MG/ML INJECTION WRAPPER: 2 2 mg/mL | INTRAMUSCULAR | Qty: 1

## 2021-04-10 MED FILL — ONDANSETRON HCL (PF) 4 MG/2 ML INJECTION SOLUTION: 4 4 mg/2 mL | INTRAMUSCULAR | Qty: 2

## 2021-04-10 MED FILL — LORAZEPAM 1 MG TABLET: 1 1 mg | ORAL | Qty: 1

## 2021-04-10 MED FILL — FENTANYL 12 MCG/HR TRANSDERMAL PATCH: 12 12 mcg/hr | TRANSDERMAL | Qty: 1

## 2021-04-10 MED FILL — METOCLOPRAMIDE 5 MG/ML INJECTION SOLUTION: 5 5 mg/mL | INTRAMUSCULAR | Qty: 2

## 2021-04-10 NOTE — Progress Notes (Signed)
Spiritual Care   Name: [name].  MRN: [id number]  Visit Date: 04/10/2021    Visit Informaton  Clinical Encounter Type  Encounter Type: End of Life  Visited With: Patient  Reason For Visit: CMO/Care and comfort, Follow up  Is an interpreter used? : N    Observations  Patient resting comfortably. Chaplain provided presence and prayer.    Assessment     Spiritual Needs: Life Transitions  Religious Needs: Prayer     Completed Interventions  Interventions  Interventions: Provided presence/accompaniment, Prayer      Plan  Plan  Continue Visiting: Yes  Next Follow-up Date: 2/2    Time Spent   Time Spent: 5    Darrel Reach, MDiv

## 2021-04-10 NOTE — Care Plan (Signed)
Goal Outcome Evaluation:     Progress: no change  Outcome Evaluation: pt is CMO     Pt comfortable with turning and pain medication this shift, pt ate toast this morning and half burger for dinner, drank thin liquids appropriately. Pt turns at 8:30 am, 10:30 am,  refused turn at 12 pm and 2 pm, turned at 4 pm, medicated at 11 am, 2 pm, 5 pm.    Problem: Adult Inpatient Plan of Care  Goal: Plan of Care Review  Outcome: Ongoing, Not Progressing  Flowsheets (Taken 04/10/2021 1829)  Progress: no change  Outcome Evaluation: pt is CMO  Goal: Patient-Specific Goal (Individualized)  Outcome: Ongoing, Not Progressing  Goal: Absence of Hospital-Acquired Illness or Injury  Outcome: Ongoing, Not Progressing  Goal: Optimal Comfort and Wellbeing  Outcome: Ongoing, Not Progressing  Goal: Readiness for Transition of Care  Outcome: Ongoing, Not Progressing

## 2021-04-10 NOTE — H&P (Signed)
INPATIENT H&P  [facility]  [address]  [place] [address]  [phone]    Today's Date: 04/10/2021  MRN: [id number]  Name: [name].  DOB: [birth year]    Chief Complaint  Patient transferred from inpatient CMO to inpatient GIP       History Of Present Illness  [name]. is a 88 y.o. male presenting with terminal carcinomatosis.  See notes from palliative care team as well as previous admission H&P.  The patient is comfortable at this time.  He is on hospice.  Looking at destinations including external facilities as part of his present plan.  His orders are along the lines of the CMO plan and hospice.    Past Medical History  He has a past medical history of Atrial flutter (CMS/HCC), Bone cancer (CMS/HCC), CHF (congestive heart failure) (CMS/HCC), HTN (hypertension), PC (prostate cancer) (CMS/HCC), and Stroke (CMS/HCC).     Surgical History  He has a past surgical history that includes MRA HEAD WO CONTRAST (10/02/2020) and MRA NECK W AND WO CONTRAST (10/02/2020).     Social History  He reports that he quit smoking about 61 years ago. His smoking use included cigarettes. He has never used smokeless tobacco. He reports that he does not currently use alcohol. He reports that he does not use drugs.     Family History  No family history on file.     Code status: DNR    Allergies  Patient has no known allergies.       IN-HOUSE MEDS:  All home medications were discontinued      PRN Meds: PRN medications: acetaminophen **OR** acetaminophen, hyoscyamine, LORazepam, methadone, metoclopramide, morphine, ondansetron    Review of Systems   Unremarkable otherwise, 12 point examination.  The patient was comfortable    Physical Exam   There were no vitals taken for this visit. There is no height or weight on file to calculate BMI.  Awake and alert.  Conversational.  No JVD.  Lungs are rather clear.  Heart regular rate and rhythm without any major rubs murmurs or gallops.  Abdomen is benign.   Extremities without edema    Relevant Lab Results  Lab Results   Component Value Date    GLUCOSE 129 (H) 04/07/2021    NA 137 04/07/2021    K 4.1 04/07/2021    CO2 25 04/07/2021    CL 109 04/07/2021    BUN 15 04/07/2021    CREATININE 1.29 04/07/2021    CALCIUM 8.6 04/07/2021    ALBUMIN 3.7 04/07/2021    PHOS 2.2 (L) 03/13/2021    TROPIHS 24 04/07/2021    AST 25 04/07/2021    ALT 15 04/07/2021    BILITOT 0.9 04/07/2021    ALKPHOS 526 (H) 04/07/2021    FERRITIN 223.9 09/03/2020    INR <1.00 10/02/2020    HGBA1C 5.5 10/03/2020    HGBA1C 5.5 10/03/2020    CHOL 208 (H) 10/03/2020    TRIG 197 (H) 10/03/2020     Lab Results   Component Value Date    WBC 4.9 04/07/2021    HGB 12.3 (L) 04/07/2021    HCT 37.3 04/07/2021    MCV 99.5 04/07/2021    PLT 139 (L) 04/07/2021       XR RESULTS: CT showed :   There are no specific findings on this exam to explain the clinical history of "word finding difficulty". Consider subspecialty consultation with Neurology, with or without followup imaging (e.g. MRI),  as clinically indicated.     ASSESSMENT AND PLAN:  Active Problems:  There are no active Hospital Problems.    Past Medical History:  1.  Metastatic prostatic carcinomatosis  2.  DNR/DNI  3.  L2 compression fracture  4.  CVA history  5.  Pneumonia history  6.  Sinus bradycardia  7.  Atrial flutter, cardioverted in 2017  8.  History of chemotherapy    Recent Assessment and Plan:   #1  General malaise  #2  Question of TIA  #3  Seeking care and comfort  #4  Patient is CMO now    Assessment and Plan:   #1 patient is CMO now  #2 second hospice options    DISCUSSION:  Tyler RhineFrederick  Jr. is a 88 y.o. male admitted from the emergency department with the following chief complaint of No chief complaint on file.. The patient is initially seen in triage and later by the emergency department physician. The initial working diagnosis is PROSTATE CANCER.    The initial work up shows a patient with metastatic static carcinomatosis with  transluminal    As a result the initial treatment includes admission to GIP status    Initial presentation most consistent with terminal illness    The lab work on this part of the admission her vital signs    Further work up and elaboration includes patient and family were comfortable with this option    The patient is expected to need inpatient services for more than two midnights.    The patient is now at a diabetic, DVT and GI prophylaxis not indicated.    The patient's code status is DO NOT RESUSCITATE and he is CMO    I certify that hospital inpatient services are reasonable and medically necessary. They are appropriately provided as inpatient services in accordance with the two midnight benchmark under 42CFR 412 3(e), or the services are specified as inpatient only procedure under 42 CFR 419 22(n).    Note dictated on Dragon, not reviewed line by line. If you find any typos or issues; or have any questions please text or call me at [phone]. It will be appreciated.     Benay PikeSEBASTIAN Renezmae Canlas, MD

## 2021-04-10 NOTE — Hospice (Signed)
PATIENT SEEN FOR GIP HOSPICE VISIT:    HE HAD A DOSE OF METHADONE YESTERDAY AND HAD NAUSEA AGAIN. HE HAS REFUSED FURTHER DOSES.  2MG  IV MORPHINE X6 IN 24 HOURS.  ONE DOSE OF IV ZOFRAN THIS MORNING.  DURING VISIT, [name]. HE RECENTLY HAD A DOSE OF MORPHINE DROWSY  ROOM AIR, BP 148/80, RESPIRATIONS EVEN AND UNLABORED.  BITES AND SIPS ONLY  REMAINS GIP PER DR. Benjaman KindlerNOWAK FOR MANAGEMENT OF PAIN WITH GOAL TO TRANSITION HOME.    RECOMMENDATIONS:  START 12MCG FENTANYL  D/C METHADONE  START SUBLINGUAL MORPHINE 5-10MG  Q2 PRN  GIVE 10MG  PO COMPAZINE ONE HOUR PRIOR TO MORPHINE DOSE    MEDICATION RECOMMENDATIONS GIVEN TO DR. Doyce ParaSEPULVEDA. PRIMARY RN UPDATED.    UPDATE DR. Zella BallOBIN BY PHONE. Francis DowseSHE WAS MADE AWARE THAT [name] COULD POTENTIALLY D/C TOMORROW.     ONLY DME NEEDED IS WHEELCHAIR-ORDERED FROM [facility].

## 2021-04-11 ENCOUNTER — Other Ambulatory Visit (HOSPICE_FACILITY)

## 2021-04-11 DIAGNOSIS — R627 Adult failure to thrive: Secondary | ICD-10-CM | POA: Diagnosis not present

## 2021-04-11 DIAGNOSIS — C61 Malignant neoplasm of prostate: Secondary | ICD-10-CM | POA: Diagnosis not present

## 2021-04-11 DIAGNOSIS — N183 Chronic kidney disease, stage 3 unspecified: Secondary | ICD-10-CM | POA: Diagnosis not present

## 2021-04-11 MED ORDER — LORazepam (Ativan) 1 mg tablet
1 | ORAL_TABLET | ORAL | 0 refills | 15.00000 days | Status: DC | PRN
Start: 2021-04-11 — End: 2021-04-12

## 2021-04-11 MED ORDER — hyoscyamine (Levsin) 0.125 mg SL tablet
0.125 | SUBLINGUAL | 0.00 refills | 25.00000 days | Status: DC | PRN
Start: 2021-04-11 — End: 2021-04-12

## 2021-04-11 MED ORDER — fentaNYL (Duragesic) 12 mcg/hr
12 | MEDICATED_PATCH | TRANSDERMAL | 0 refills | Status: DC
Start: 2021-04-11 — End: 2021-04-12

## 2021-04-11 MED FILL — MORPHINE 2 MG/ML INJECTION WRAPPER: 2 2 mg/mL | INTRAMUSCULAR | Qty: 1

## 2021-04-11 MED FILL — LORAZEPAM 1 MG TABLET: 1 1 mg | ORAL | Qty: 1

## 2021-04-11 MED FILL — METOCLOPRAMIDE 5 MG/ML INJECTION SOLUTION: 5 5 mg/mL | INTRAMUSCULAR | Qty: 2

## 2021-04-11 NOTE — Nursing Note (Addendum)
Pt is CMO. Pt refuses turn/repositioning. Pt turned/repositioned ~2200, full bed change. PRN's given, mouthcare provided. Pt up to recliner at 0245, d/t severe pain.     Pt's pain is poorly controlled on current regimen.     Pt could benefit from foley for comfort. External primofit does not work effectively.

## 2021-04-11 NOTE — Progress Notes (Addendum)
[facility] Cindy has discussed bed available at the hospice [facility]. Family is agreebale to this plan.  TT sent to [facility] to complete dc summary. He will complete.  Liaison Cindy aware pt is booked for 830pm PS. [facility] rep aware.   Winfield Cunas RN 04/11/2021 1:44 PM

## 2021-04-11 NOTE — Nursing Note (Signed)
Pt comfortable through out the shift with around the clock pain medications, no complains of nausea noted, anxiety managed by ativan, pt was changed and turned in the morning, ate breakfast, turned in afternoon, pt refused turn at 12 pm and  2 pm,  pain medications given every 3 hours, assisted with supper and ate 70% of supper, turned th pt after supper.

## 2021-04-11 NOTE — Discharge Summary (Signed)
Discharge Diagnosis  1.  Metastatic carcinomatosis  2.  Patient is CMO  3.  He is DNR/DNI    Hospital Course  The gentleman came in from home for by his family.  He was suffering from pain and fatigue.  His case is consistent with terminal carcinomatosis.  At this point he is going to hospice home.  He is not cardiac in replace for fentanyl which seems to be working quite well.  His oral intake is reasonable.  His external medications have been stopped.  He remains comfortable.    Had a conversation with his daughter [name] today who I know from the past.  They are very comfortable with the plan    Test Results Pending At Discharge  Nothing pending    Pertinent Physical Exam At Time of Discharge  Physical Exam no vital signs measured for days as a CMO.  Otherwise he is awake and conversational.  Having breakfast at time of visit this morning.  No JVD.  Lungs quite clear.  Heart regular rate and rhythm.  Abdomen benign.  Extremities no edema    Issues Requiring Follow-Up  Hospice.  No other appointments.    Outpatient Follow-Up  No further follow-up except by the hospice team    Benay Pike, MD

## 2021-04-11 NOTE — Nursing Note (Signed)
Ambulance here to pick pt up. No molst on file. Spoke with family and md no form dine. Dr Shelle Ironsepeveda aware will do in am, spoke with [facility]. Call in am and set up time once md fills form out.

## 2021-04-11 NOTE — Progress Notes (Signed)
[facility]  CMO PROGRESS NOTE    Today's Date: 04/11/2021  MRN: [id number]  Name: Tyler RhineFrederick  Jr.  DOB: 06/17/1931    SUBJECTIVE  Denies new complaints    OBJECTIVE  Having breakfast this morning    PHYSICAL EXAM  Wake and conversational.  No JVD.  Lungs are rather clear.  Heart regular rate and rhythm.  Abdomen benign.  Extremities no edema    MEDICATIONS IN HOUSE:     PRN medications: acetaminophen **OR** acetaminophen, hyoscyamine, LORazepam, metoclopramide, morphine, ondansetron   PRN medications: acetaminophen **OR** acetaminophen, hyoscyamine, LORazepam, metoclopramide, morphine, ondansetron     ASSESSMENT/PLAN  -Patient with terminal prostate carcinomatosis  -Resting comfortably  -Is able to eat  -He denies any complaints  -Continue present plan  -Likely hospice house will be okay for him or perhaps at home  -Patient doing well on fentanyl patch    Benay PikeSEBASTIAN Ameliana Brashear, MD

## 2021-04-11 NOTE — Case Communication (Signed)
ARRIVED TO FLOOR FOR REVISIT AND FSN JULIA STATES PT HAS HAD POORLY CONTROLLED PAIN. HE HAS HAD IV MORPHINE 2MG  AND HAS HAD IT Q3H AND NEEDED THIS SOMETIMES SOONER THAN Q3H. PT HAS FENTANYL PATCH IN PLACE AND PT STILL REPORTING PAIN. BOTH DTR HCP AND ANOTHER DTR PRESENT AT TIME OF VISIT. PT HAS ATIVAN ORDERED BUT HAD ON RECEIVED 1 DOSE OVERNIGHT. DISCUSSED W/ FAMILY OPTION FOR TRANSFER TO [facility] GIP LOC AND BOTH DTR'S FEEL THIS IS BETTER C HOICE FOR PT AND DO NOT MIND THE DRIVE.     CALL TO [facility] DERANEY AND PT IS APPROVED FOR TRANSFER TO [facility] GIP LOC. LINDA MILLER AND TL MELANIE NOTIFED. LEFT TL NUMBER W/ FSN TO GIVE REPORT PRIOR TO DC. AMBULANCE BOOKED FOR 830P PICKUP. HCP DTR WILL BE W/ PT FOR TRANSFER AND WILL SIGN ANY ADDITIONAL DOCUMENS NEEDED. ALL PARTIES IN AGREEMENT W/ PLAN FOR TRANSFER INCLUDING MD, CM AND FSN.

## 2021-04-12 ENCOUNTER — Encounter
Admit: 2021-04-12 | Discharge: 2021-05-08 | Disposition: E | Payer: MEDICARE | Source: Other Acute Inpatient Hospital | Attending: Internal Medicine | Admitting: Internal Medicine

## 2021-04-12 ENCOUNTER — Other Ambulatory Visit

## 2021-04-12 ENCOUNTER — Encounter (HOSPICE_FACILITY)

## 2021-04-12 ENCOUNTER — Other Ambulatory Visit: Admitting: Family

## 2021-04-12 ENCOUNTER — Other Ambulatory Visit (HOSPICE_FACILITY)

## 2021-04-12 DIAGNOSIS — I639 Cerebral infarction, unspecified: Secondary | ICD-10-CM | POA: Diagnosis not present

## 2021-04-12 DIAGNOSIS — C61 Malignant neoplasm of prostate: Secondary | ICD-10-CM | POA: Diagnosis not present

## 2021-04-12 DIAGNOSIS — R627 Adult failure to thrive: Secondary | ICD-10-CM | POA: Diagnosis not present

## 2021-04-12 DIAGNOSIS — N183 Chronic kidney disease, stage 3 unspecified: Secondary | ICD-10-CM | POA: Diagnosis not present

## 2021-04-12 MED ORDER — glycopyrrolate, PF, 1 mg/5 mL (0.2 mg/mL) syringe
1 | INTRAMUSCULAR | 0 refills | Status: DC | PRN
Start: 2021-04-12 — End: 2021-04-12

## 2021-04-12 MED ORDER — acetaminophen (Tylenol) suppository 650 mg
650 | RECTAL | Status: DC | PRN
Start: 2021-04-12 — End: 2021-04-15

## 2021-04-12 MED ORDER — fentaNYL (Duragesic) 12 mcg/hr 1 patch
12 | TRANSDERMAL | Status: DC
Start: 2021-04-12 — End: 2021-04-15
  Administered 2021-04-13: 21:00:00 1 via TRANSDERMAL

## 2021-04-12 MED ORDER — bisacodyl (Dulcolax) 10 mg suppository
10 | Freq: Every day | RECTAL | 0 refills | 12.00000 days | Status: DC | PRN
Start: 2021-04-12 — End: 2021-04-12

## 2021-04-12 MED ORDER — HYDROmorphone (Dilaudid) 2 mg/mL injection
2 | INTRAMUSCULAR | 0 refills | Status: DC
Start: 2021-04-12 — End: 2021-04-12

## 2021-04-12 MED ORDER — haloperidol lactate (Haldol) 5 mg/mL injection
5 | INTRAMUSCULAR | 0 refills | Status: DC | PRN
Start: 2021-04-12 — End: 2021-04-12

## 2021-04-12 MED ORDER — HYDROmorphone (Dilaudid) 2 mg/mL injection
2 | INTRAMUSCULAR | 0 refills | Status: DC | PRN
Start: 2021-04-12 — End: 2021-04-12

## 2021-04-12 MED ORDER — acetaminophen (Tylenol) 650 mg suppository
650 | Freq: Four times a day (QID) | RECTAL | 0 refills | 8.00000 days | Status: AC | PRN
Start: 2021-04-12 — End: ?

## 2021-04-12 MED ORDER — HYDROmorphone (Dilaudid) injection 0.5 mg
2 | INTRAMUSCULAR | Status: DC
Start: 2021-04-12 — End: 2021-04-15
  Administered 2021-04-13 – 2021-04-15 (×16): 0.5 mg via INTRAVENOUS

## 2021-04-12 MED ORDER — LORazepam (Ativan) 2 mg/mL injection
2 | INTRAMUSCULAR | 0 refills | 15.00000 days | Status: DC | PRN
Start: 2021-04-12 — End: 2021-04-17

## 2021-04-12 MED ORDER — HYDROmorphone (Dilaudid) 2 mg/mL injection
2 | INTRAMUSCULAR | 0 refills | Status: DC | PRN
Start: 2021-04-12 — End: 2021-04-17

## 2021-04-12 MED ORDER — HYDROmorphone (Dilaudid) injection 0.5-1 mg
2 | INTRAMUSCULAR | Status: DC | PRN
Start: 2021-04-12 — End: 2021-04-17
  Administered 2021-04-12 – 2021-04-13 (×2): 1 mg via INTRAVENOUS
  Administered 2021-04-13 (×2): 0.5 mg via INTRAVENOUS
  Administered 2021-04-14: 04:00:00 1 mg via INTRAVENOUS
  Administered 2021-04-14: 13:00:00 0.5 mg via INTRAVENOUS
  Administered 2021-04-14: 11:00:00 1 mg via INTRAVENOUS
  Administered 2021-04-14: 05:00:00 0.5 mg via INTRAVENOUS
  Administered 2021-04-14: 22:00:00 1 mg via INTRAVENOUS
  Administered 2021-04-14 – 2021-04-15 (×6): 0.5 mg via INTRAVENOUS
  Administered 2021-04-16 – 2021-04-17 (×6): 1 mg via INTRAVENOUS

## 2021-04-12 MED ORDER — LORazepam (Ativan) 2 mg/mL injection
2 | INTRAMUSCULAR | 0 refills | 15.00000 days | Status: DC | PRN
Start: 2021-04-12 — End: 2021-04-12

## 2021-04-12 MED ORDER — bisacodyl (Dulcolax) 10 mg suppository
10 | Freq: Every day | RECTAL | 0 refills | 12.00000 days | Status: AC | PRN
Start: 2021-04-12 — End: 2021-04-22

## 2021-04-12 MED ORDER — glycopyrrolate, PF, 1 mg/5 mL (0.2 mg/mL) syringe
1 | INTRAMUSCULAR | 0 refills | Status: DC | PRN
Start: 2021-04-12 — End: 2021-04-17

## 2021-04-12 MED ORDER — LORazepam (Ativan) injection 0.5-1 mg
2 | INTRAMUSCULAR | Status: DC | PRN
Start: 2021-04-12 — End: 2021-04-17
  Administered 2021-04-12 – 2021-04-15 (×7): 1 mg via INTRAVENOUS

## 2021-04-12 MED ORDER — acetaminophen (Tylenol) suppository 650 mg
650 | Freq: Four times a day (QID) | RECTAL | Status: DC | PRN
Start: 2021-04-12 — End: 2021-04-19
  Administered 2021-04-18: 22:00:00 650 mg via RECTAL

## 2021-04-12 MED ORDER — haloperidol lactate (Haldol) 5 mg/mL injection
5 | INTRAMUSCULAR | 0 refills | Status: AC | PRN
Start: 2021-04-12 — End: ?

## 2021-04-12 MED ORDER — fentaNYL (Duragesic) 12 mcg/hr 1 patch
12 | TRANSDERMAL | Status: DC
Start: 2021-04-12 — End: 2021-04-12

## 2021-04-12 MED ORDER — HYDROmorphone (Dilaudid) 2 mg/mL injection
2 | INTRAMUSCULAR | 0 refills | Status: DC
Start: 2021-04-12 — End: 2021-04-15

## 2021-04-12 MED ORDER — glycopyrrolate (PF) syringe 0.2-0.4 mg
1 | INTRAMUSCULAR | Status: DC | PRN
Start: 2021-04-12 — End: 2021-04-19
  Administered 2021-04-15 – 2021-04-17 (×2): 0.2 mg via SUBCUTANEOUS
  Administered 2021-04-17 – 2021-04-19 (×3): 0.4 mg via SUBCUTANEOUS

## 2021-04-12 MED ORDER — fentaNYL (Duragesic) 12 mcg/hr
12 | MEDICATED_PATCH | TRANSDERMAL | 0 refills | Status: DC
Start: 2021-04-12 — End: 2021-04-12

## 2021-04-12 MED ORDER — bisacodyl (Dulcolax) suppository 10 mg
10 | Freq: Every day | RECTAL | Status: DC | PRN
Start: 2021-04-12 — End: 2021-04-19
  Administered 2021-04-14: 04:00:00 10 mg via RECTAL

## 2021-04-12 MED ORDER — fentaNYL (Duragesic) 12 mcg/hr
12 | MEDICATED_PATCH | TRANSDERMAL | 0 refills | Status: DC
Start: 2021-04-12 — End: 2021-04-17

## 2021-04-12 MED ORDER — haloperidol lactate (Haldol) injection 1-2 mg
5 | INTRAMUSCULAR | Status: DC | PRN
Start: 2021-04-12 — End: 2021-04-19

## 2021-04-12 MED ORDER — acetaminophen (Tylenol) 650 mg suppository
650 | Freq: Four times a day (QID) | RECTAL | 0 refills | 8.00000 days | Status: DC | PRN
Start: 2021-04-12 — End: 2021-04-12

## 2021-04-12 MED ORDER — acetaminophen (Tylenol) tablet 650 mg
325 | ORAL | Status: DC | PRN
Start: 2021-04-12 — End: 2021-04-15

## 2021-04-12 MED FILL — MORPHINE 2 MG/ML INJECTION WRAPPER: 2 2 mg/mL | INTRAMUSCULAR | Qty: 1

## 2021-04-12 MED FILL — LORAZEPAM 1 MG TABLET: 1 1 mg | ORAL | Qty: 1

## 2021-04-12 MED FILL — ACETAMINOPHEN 325 MG TABLET: 325 325 mg | ORAL | Qty: 2

## 2021-04-12 NOTE — Progress Notes (Signed)
[facility]  CMO PROGRESS NOTE    Today's Date: 05/04/2021  MRN: [id number]  Name: [name].  DOB: 03/07/1932    SUBJECTIVE  Feeling relatively well    OBJECTIVE  Looking well and comfortable this morning    PHYSICAL EXAM  Awake, rather alert.  No JVD.  Lungs are rather clear.  Heart regular rhythm.  Abdomen is benign.  Extremities no edema    MEDICATIONS IN HOUSE:     PRN medications: acetaminophen **OR** acetaminophen, hyoscyamine, LORazepam, metoclopramide, morphine, ondansetron   PRN medications: acetaminophen **OR** acetaminophen, hyoscyamine, LORazepam, metoclopramide, morphine, ondansetron     ASSESSMENT/PLAN  -CMO patient going to hospice home today  -See patient discussed with his daughter [name] today  -The actual proxy is [name] will sign the MOL[facility] form.  We completed the front page which is always near for transportation purposes  -As explained, they can sign a new form upon arrival if they wish to complete the second page of the MOLST form  -Everybody is on board.  Everybody believes this is the most humane approach        Benay Pike, MD

## 2021-04-12 NOTE — Care Plan (Signed)
Goal Outcome Evaluation:

## 2021-04-12 NOTE — Nursing Note (Signed)
Patient discharged to [facility] house in  [place]. Report receive by nurse at hospice house.

## 2021-04-12 NOTE — Progress Notes (Signed)
[place] MEDICINE CARE AT [facility] Medicine Care at [facility]  [address]  [place]. [address]  [place] [address]  Dept: [phone]  Dept Fax: [phone]     Chief Complaint: pain, anxiety     [id number] [name].  88 y.o. male with diagnosis of metastatic prostate cancer. Family notes diagnosis of prostate cancer in 2006 with bone metastasis in 2019. Had been doing relatively well up until June 2022 when he had a CVA with minimal residual effect. Found to have elevated PSA. Presented to the ED with increased pain, ultimately electing to transition to comfort focused care. Transferred to [facility] GIP level of care for management of pain, anxiety 2021/05/10.     At time of this visit patient is minimally responsive. He was requiring Morphine 2 mg IV approximately q3h prn. Fentanyl 12 mcg TD patch is in place from 2/1. Family notes despite this he continues to have pain. They noted decreased UO and dark urine. Family notes patient with poor oral intake at this time, and that he has become less alert in recent days.      Past Medical History:   Diagnosis Date   . Atrial flutter (CMS/HCC)    . Bone cancer (CMS/HCC)    . CHF (congestive heart failure) (CMS/HCC)    . HTN (hypertension)    . PC (prostate cancer) (CMS/HCC)    . Stroke (CMS/HCC)          Past Surgical History:   Procedure Laterality Date   . MRA HEAD WO CONTRAST  10/02/2020    MRA HEAD WO CONTRAST 10/02/2020 [facility] MRI   . MRA NECK W AND WO CONTRAST  10/02/2020    MRA NECK W AND WO CONTRAST 10/02/2020 [facility] MRI        No family history on file.     Social History     Socioeconomic History   . Marital status: Widowed     Spouse name: Not on file   . Number of children: Not on file   . Years of education: Not on file   . Highest education level: Not on file   Occupational History   . Not on file   Tobacco Use   . Smoking status: Former     Types: Cigarettes     Quit date: 1962     Years since quitting: 61.1   . Smokeless tobacco: Never   Substance and Sexual Activity   .  Alcohol use: Not Currently   . Drug use: Never   . Sexual activity: Not Currently   Other Topics Concern   . Not on file   Social History Narrative    Currently, he lives with daughter and goes to [place].      Social Determinants of Health     Financial Resource Strain: Not on file   Food Insecurity: Not on file   Transportation Needs: Not on file   Physical Activity: Not on file   Stress: Not on file   Social Connections: Not on file   Intimate Partner Violence: Not on file   Housing Stability: Not on file         No Known Allergies      Current Facility-Administered Medications   Medication Dose Route Frequency Provider Last Rate Last Admin   . acetaminophen (Tylenol) suppository 650 mg  650 mg rectal q6h PRN Lenice Llamas, NP       . bisacodyl (Dulcolax) suppository 10 mg  10 mg rectal  Daily PRN Lenice Llamas, NP       . fentaNYL (Duragesic) 12 mcg/hr 1 patch  1 patch transdermal q72h Lenice Llamas, NP       . glycopyrrolate (PF) syringe 0.2-0.4 mg  0.2-0.4 mg subcutaneous q4h PRN Lenice Llamas, NP       . haloperidol lactate (Haldol) injection 1-2 mg  1-2 mg intravenous q2h PRN Lenice Llamas, NP       . HYDROmorphone (Dilaudid) injection 0.5 mg  0.5 mg intravenous q4h Lenice Llamas, NP       . HYDROmorphone (Dilaudid) injection 0.5-1 mg  0.5-1 mg intravenous q1h PRN Lenice Llamas, NP       . LORazepam (Ativan) injection 0.5-1 mg  0.5-1 mg intravenous q2h PRN Lenice Llamas, NP             ROS: Reports yes to pain, otherwise limited ROS     PE:  BP 120/80 (BP Location: Left arm, Patient Position: Lying)   Pulse 92   Temp 36 C (96.8 F) (Temporal)   Resp 14   SpO2 95%    PPS 10%    General: Pale, lying in bed with minimal response to exam  HEENT: No palpable cervical adenopathy  Resp: Lung sounds clear, diminsihed  Cardiac: NL S1S2  GI: Abomen non-distended + BS non tender  Ext: + pedal pulses  Neuro: Unable to follow commands    LABS:  Lab Results   Component Value Date    GLUCOSE 129 (H) 04/07/2021     CALCIUM 8.6 04/07/2021    NA 137 04/07/2021    K 4.1 04/07/2021    CO2 25 04/07/2021    CL 109 04/07/2021    BUN 15 04/07/2021    CREATININE 1.29 04/07/2021      Lab Results   Component Value Date    WBC 4.9 04/07/2021    HGB 12.3 (L) 04/07/2021    HCT 37.3 04/07/2021    MCV 99.5 04/07/2021    PLT 139 (L) 04/07/2021             Assessment:   88 y.o. male with metastatic prostate cancer and significant pain in the setting of bone metastasis. Prognosis likely days to a week if continues on current trajectory.     LEVEL OF CARE:    This patient is GIP level of care for the management of pain and anxiety requiring use of IV medication . The patient continues to require frequent RN/NP/MD assessment, plan of care adjustments and titration of medications in an inpatient setting to control symptoms.    PLAN:  Pain:  Fentanyl 12 mcg TD Q72H patch  Dilaudid 0.5 mg IV q4h  Dilaudid 0.5-1 mg IV qh prn pain/dyspnea    Anxiety:  Ativan 0.5-1 mg IV q2h prn anxiety/agitation    Secretions  Glycopyrrolate 0.2-0.4 mg IV q4h prn secretions    MOLST completed, DNR/DNI. Hospital faxing copy of completed MOLST to [facility] at time of visit.      Plan of care reviewed and discussed with the care team at interdisciplinary team rounds.    Time In: 330p  Time Out: 430p  Total time of visit: 60 minutes, with greater than 50% of the time spent on counseling and coordination of patient care.

## 2021-04-12 NOTE — Progress Notes (Signed)
No molst on file. Unable to go [facility] last night with out one. MD has signed one today. Booked for 2pm . [facility] aware. RN report to be given.   Winfield CunasStephanie Opal Mckellips RN 04/26/2021 9:42 AM

## 2021-04-13 MED ORDER — LORazepam (Ativan) injection 0.5-1 mg
2 | INTRAMUSCULAR | Status: DC | PRN
Start: 2021-04-13 — End: 2021-04-13

## 2021-04-13 MED ORDER — HYDROmorphone (Dilaudid) injection 0.5-1 mg
2 | INTRAMUSCULAR | Status: DC | PRN
Start: 2021-04-13 — End: 2021-04-13

## 2021-04-13 NOTE — Progress Notes (Signed)
MEDICINE CARE AT [facility] Medicine Care at [facility]  [address]  [place]  [place] [address]  Dept: [phone]  Dept Fax: [phone]       Chief Complaint: pain and anxiety     [name].  88 y.o. male with diagnosis of metastatic prostate carcinomatosis. Recent hospitalization for acute neurologic deficit with worsening difficulty finding words, found to have possible TIA and thought to have progression of disease vs acute CVA.  Based on Pt end of life wishes and goals of care, pt transferred to [facility] GIP LOC for management of pain and anxiety.     At time of this visit pt was alert and able a few questions but quickly fell back to sleep      No Known Allergies      Current Facility-Administered Medications   Medication Dose Route Frequency Provider Last Rate Last Admin   . acetaminophen (Tylenol) suppository 650 mg  650 mg rectal q6h PRN Lenice Llamas, NP       . acetaminophen (Tylenol) tablet 650 mg  650 mg oral q4h PRN Lenice Llamas, NP        Or   . acetaminophen (Tylenol) suppository 650 mg  650 mg rectal q4h PRN Lenice Llamas, NP       . bisacodyl (Dulcolax) suppository 10 mg  10 mg rectal Daily PRN Lenice Llamas, NP       . fentaNYL (Duragesic) 12 mcg/hr 1 patch  1 patch transdermal q72h Lenice Llamas, NP       . glycopyrrolate (PF) syringe 0.2-0.4 mg  0.2-0.4 mg subcutaneous q4h PRN Lenice Llamas, NP       . haloperidol lactate (Haldol) injection 1-2 mg  1-2 mg intravenous q2h PRN Lenice Llamas, NP       . HYDROmorphone (Dilaudid) injection 0.5 mg  0.5 mg intravenous q4h Lenice Llamas, NP   0.5 mg at 04/13/21 1200   . HYDROmorphone (Dilaudid) injection 0.5-1 mg  0.5-1 mg intravenous q1h PRN Lenice Llamas, NP   0.5 mg at 04/13/21 1200   . LORazepam (Ativan) injection 0.5-1 mg  0.5-1 mg intravenous q2h PRN Lenice Llamas, NP   1 mg at 05/02/2021 1800         PE:  BP (!) 157/88 (BP Location: Left arm, Patient Position: Lying)   Pulse 99   Temp 36.3 C (97.4 F) (Temporal)   Resp 18   SpO2  99%    PPS 10    General: pale male laying in bed alert  HEENT: PERR, sclera anicteric, MM moist   Resp: anteriorly diminished, no wheeze, rales or rhonchi    Cardiac: NL S1S2 no mrg  GI: soft non distended + BOS  Ext: warm + pulses  Neuro: able to answer simple questions, quickly goes back to sleep     Assessment:   88 y.o. male with a terminal hospice diagnosis of metastatic prostate carcinomatosis. Recent increase in pain r/t bone mets hip and spine.  More alert today.  No change in current plan of care.      LEVEL OF CARE:    This patient is GIP level of care for the management of pain and anxiety . The patient continues to require frequent RN/NP/MD assessment, plan of care adjustments and titration of medications in an inpatient setting to control symptoms.    PLAN:  No change in current plan of care     Plan of care reviewed and discussed with the care  team at interdisciplinary team rounds.    Time In:1100  Time Out: 1130  Total time of visit: 20 minutes, with greater than 50% of the time spent on counseling and coordination of patient care.

## 2021-04-14 ENCOUNTER — Other Ambulatory Visit

## 2021-04-15 ENCOUNTER — Other Ambulatory Visit: Admitting: Family

## 2021-04-15 ENCOUNTER — Other Ambulatory Visit

## 2021-04-15 MED ORDER — LORazepam (Ativan) injection 1 mg
2 | INTRAMUSCULAR | Status: DC
Start: 2021-04-15 — End: 2021-04-19
  Administered 2021-04-15 – 2021-04-19 (×21): 1 mg via INTRAVENOUS

## 2021-04-15 MED ORDER — HYDROmorphone (Dilaudid) 2 mg/mL injection
2 | INTRAMUSCULAR | 0 refills | Status: DC
Start: 2021-04-15 — End: 2021-04-16

## 2021-04-15 MED ORDER — LORazepam (Ativan) 2 mg/mL injection
2 | INTRAMUSCULAR | 0 refills | 15.00000 days | Status: DC
Start: 2021-04-15 — End: 2021-04-16

## 2021-04-15 MED ORDER — HYDROmorphone (Dilaudid) injection 1 mg
2 | INTRAMUSCULAR | Status: DC
Start: 2021-04-15 — End: 2021-04-17
  Administered 2021-04-15 – 2021-04-17 (×13): 1 mg via INTRAVENOUS

## 2021-04-15 MED ORDER — HYDROmorphone (Dilaudid) 2 mg/mL injection
2 | INTRAMUSCULAR | 0 refills | Status: DC
Start: 2021-04-15 — End: 2021-04-15

## 2021-04-15 NOTE — Progress Notes (Signed)
MSW met with PT's dtr Okey Regalarol and her brother in this writers office. MSW updated contact information ensuring information was correct. MSW reviewed role and support available to them during this time. Pt's son Rosanne AshingJim asked questions about prognosis, he acknowledges understanding that his father is not taking in much water and is no longer eating. MSW offered to have NP revisit them this afternoon to answer further questions he had.    MSW learned that the families oldest sibling passed away unexpectedly in her sleep this weekend. She had just come back to [place] from being out in [place] for over 20 years. Okey RegalCarol is struggling with the shock of this loss, Rosanne AshingJim is turning to his faith and believing that his sister who just came to [place] 2 weeks ago was suppose to come back to be with family little did they expect though her death.     [facility]. MSW advised them to seek locating Pt's DD-214 paperwork or to see if the funeral home can assist with this in order to pay tribute to [employer]'s service int he [employer].    MSW spoke with Pt's other dtr Zella BallRobin this afternoon who asked if the form needed to [facility], MSW provided clarification on the funeral home will need it. Dtr asked about getting a POA, as [place] the [place] and they would not release information to her since shes not the POA. MSW provided education around [place] and unfortunately Pt is not able to participate in having one written up by an attorney.

## 2021-04-15 NOTE — Care Plan (Signed)
Problem: Adult Inpatient Plan of Care  Goal: Plan of Care Review  Outcome: Ongoing, Progressing  Goal: Patient-Specific Goal (Individualized)  Outcome: Ongoing, Progressing  Goal: Absence of Hospital-Acquired Illness or Injury  Outcome: Ongoing, Progressing  Intervention: Prevent Skin Injury  Recent Flowsheet Documentation  Taken 04/15/2021 0747 by Charlann Boxer, RN  Body Position: left  Goal: Optimal Comfort and Wellbeing  Outcome: Ongoing, Progressing  Goal: Readiness for Transition of Care  Outcome: Ongoing, Progressing     Problem: Supervision of the home health aide  Goal: Perform aide supervisory visit at minimum every 14 days  Outcome: Ongoing, Progressing     Problem: Alteration in mobility  Goal: Patient will remain as independent as possible and remain safe in their environment  Outcome: Ongoing, Progressing     Problem: Anticipatory Grief  Goal: Patient/caregiver/family will explore reactions to and verbalize acceptance of impending loss  Outcome: Ongoing, Progressing     Problem: Comfort deficit  Goal: Patient will report that pain has been reduced or controlled through verbal or nonverbal means and that measures to promote comfort are effective  Outcome: Ongoing, Progressing     Problem: Communication Deficit  Goal: Patient/caregiver/family will effectively communicate symptoms, needs and concerns  Outcome: Ongoing, Progressing     Problem: End of life process  Goal: Patient's caregiver will demonstrate understanding and acceptance of the end of life processes  Outcome: Ongoing, Progressing     Problem: Hospice orientation  Goal: The patient/family/caregiver will demonstrate understanding of the hospice philosophy  Outcome: Ongoing, Progressing   Goal Outcome Evaluation:

## 2021-04-15 NOTE — Progress Notes (Signed)
[place] MEDICINE CARE AT [facility] Medicine Care at [facility]  [address]  [place]  [place] [address]  Dept: [phone]  Dept Fax: [phone]       Chief Complaint: Pain, anxiety     [id number] [name].  88 y.o. male with diagnosis of metastatic prostate cancer. Family notes diagnosis of prostate cancer in 2006 with bone metastasis in 2019. Had been doing relatively well up until June 2022 when he had a CVA with minimal residual effect. Found to have elevated PSA. Presented to the ED with increased pain, ultimately electing to transition to comfort focused care. Transferred to [facility] GIP level of care for management of pain, anxiety Apr 22, 2021.      At time of this visit patient is unable to provide history. He was receiving Dilaudid 0.5 mg IV q4h for management of pain, required Dilaudid 0.5 mg IV x 4 in addition to scheduled due to ongoing s/s pain. Required Ativan 1 mg IV x 4 prn for anxiety. Not eating or drinking, mouth care only at this time.      No Known Allergies      Current Facility-Administered Medications   Medication Dose Route Frequency Provider Last Rate Last Admin   . acetaminophen (Tylenol) suppository 650 mg  650 mg rectal q6h PRN Lenice Llamas, NP       . acetaminophen (Tylenol) tablet 650 mg  650 mg oral q4h PRN Lenice Llamas, NP        Or   . acetaminophen (Tylenol) suppository 650 mg  650 mg rectal q4h PRN Lenice Llamas, NP       . bisacodyl (Dulcolax) suppository 10 mg  10 mg rectal Daily PRN Lenice Llamas, NP   10 mg at 04/13/21 2258   . fentaNYL (Duragesic) 12 mcg/hr 1 patch  1 patch transdermal q72h Lenice Llamas, NP   1 patch at 04/13/21 1600   . glycopyrrolate (PF) syringe 0.2-0.4 mg  0.2-0.4 mg subcutaneous q4h PRN Lenice Llamas, NP   0.2 mg at 04/14/21 2355   . haloperidol lactate (Haldol) injection 1-2 mg  1-2 mg intravenous q2h PRN Lenice Llamas, NP       . HYDROmorphone (Dilaudid) injection 0.5-1 mg  0.5-1 mg intravenous q1h PRN Lenice Llamas, NP   0.5 mg at 04/15/21 0747   .  HYDROmorphone (Dilaudid) injection 1 mg  1 mg intravenous q4h Lenice Llamas, NP       . LORazepam (Ativan) injection 0.5-1 mg  0.5-1 mg intravenous q2h PRN Lenice Llamas, NP   1 mg at 04/15/21 0747   . LORazepam (Ativan) injection 1 mg  1 mg intravenous q4h Lenice Llamas, NP             PE:  BP 137/79 (BP Location: Left arm, Patient Position: Lying)   Pulse 107   Temp 36.6 C (97.9 F) (Temporal)   Resp 12   SpO2 (!) 94%    PPS 10%    General: Pale, lying in bed unable to provide ROS  HEENT: + temporal wasting  Resp: Lung sounds diminished throughout  Cardiac: NL S1S2, tachycardic  GI: Abdomen non-distended, hypoactive bowel sounds, non tender  Ext: + weak pedal pulses  Neuro: Unable to follow commands     Assessment:   88 y.o. male with metastatic prostate cancer to the bone. Prognosis likely hours to days if continues on current trajectory.      LEVEL OF CARE:    This patient is GIP level of care  for the management of pain, anxiety with IV medication requiring PRN and scheduled dosing and medication increases today . The patient continues to require frequent RN/NP/MD assessment, plan of care adjustments and titration of medications in an inpatient setting to control symptoms.    PLAN:  Pain:  Increase to dilaudid 1 mg IV q4h    Anxiety:  Schedule Ativan 1 mg IV q4h    Met with family and reviewed plan of care as well as prognostic outlook. Family is understanding. They unfortunately report the passing of their sister unexpectedly this weekend, and have rallied around one another in support at this challenging time. Will benefit from ongoing support of the IDT.      Plan of care reviewed and discussed with the care team at interdisciplinary team rounds.    Time In: 11a  Time Out: 1140a  Total time of visit: 40 minutes, with greater than 50% of the time spent on counseling and coordination of patient care.

## 2021-04-16 ENCOUNTER — Other Ambulatory Visit

## 2021-04-16 MED ORDER — LORazepam (Ativan) 2 mg/mL injection
2 | INTRAMUSCULAR | 0 refills | 15.00000 days | Status: DC
Start: 2021-04-16 — End: 2021-04-18

## 2021-04-16 MED ORDER — HYDROmorphone (Dilaudid) 2 mg/mL injection
2 | INTRAMUSCULAR | 0 refills | Status: DC
Start: 2021-04-16 — End: 2021-04-17

## 2021-04-16 NOTE — Progress Notes (Signed)
Visit with pt alone in his room. He was sound asleep. Gifted him with a prayer shaw. Prayed for him and his fmaily. Found family sitting by the fire. Spoke with dt., Selena BattenKim, sister Dondra SpryGail and brother in law- Nel. 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 He served his [place] in [place] and it was a very difficult time for him. He has 8 children and the oldest one, just died. Family was grateful to learn about bereavement supports as they feel they are carrying a lot of grief right now. They were glad to know I had prayed for him and is family. Glad he was honored for serving the [place]. Family says that Gelene MinkFrederick was a wonderful father. Kim said when she told him that he had tears streaming down his face.

## 2021-04-16 NOTE — Care Plan (Signed)
Problem: Adult Inpatient Plan of Care  Goal: Plan of Care Review  Outcome: Ongoing, Progressing  Goal: Patient-Specific Goal (Individualized)  Outcome: Ongoing, Progressing  Goal: Absence of Hospital-Acquired Illness or Injury  Outcome: Ongoing, Progressing  Intervention: Prevent Skin Injury  Recent Flowsheet Documentation  Taken 04/16/2021 0815 by Charlann Boxer, RN  Body Position:   left   foot of bed elevated   position maintained  Goal: Optimal Comfort and Wellbeing  Outcome: Ongoing, Progressing  Goal: Readiness for Transition of Care  Outcome: Ongoing, Progressing     Problem: Supervision of the home health aide  Goal: Perform aide supervisory visit at minimum every 14 days  Outcome: Ongoing, Progressing     Problem: Alteration in mobility  Goal: Patient will remain as independent as possible and remain safe in their environment  Outcome: Ongoing, Progressing     Problem: Anticipatory Grief  Goal: Patient/caregiver/family will explore reactions to and verbalize acceptance of impending loss  Outcome: Ongoing, Progressing     Problem: Comfort deficit  Goal: Patient will report that pain has been reduced or controlled through verbal or nonverbal means and that measures to promote comfort are effective  Outcome: Ongoing, Progressing     Problem: Communication Deficit  Goal: Patient/caregiver/family will effectively communicate symptoms, needs and concerns  Outcome: Ongoing, Progressing     Problem: End of life process  Goal: Patient's caregiver will demonstrate understanding and acceptance of the end of life processes  Outcome: Ongoing, Progressing     Problem: Hospice orientation  Goal: The patient/family/caregiver will demonstrate understanding of the hospice philosophy  Outcome: Ongoing, Progressing     Problem: Anticipatory grief  Goal: Patient/caregiver/family will explore reactions to and verbalize acceptance of impending loss  Outcome: Ongoing, Progressing  Goal: Identified at risk bereavement family  member/caregiver will receive pre-bereavement counseling services  Outcome: Ongoing, Progressing  Goal: Increased acceptance of impending death and accompanying losses caregivers will express increased preparedness for impending loss  Outcome: Ongoing, Progressing     Problem: Dying Process  Goal: Patient/family increase peace with dying process  Outcome: Ongoing, Progressing  Goal: Patient coping identified  Outcome: Ongoing, Progressing  Goal: Patient and family expressed gratitude  Outcome: Ongoing, Progressing  Goal: Spiritual distress identified and decreased with visit  Outcome: Ongoing, Progressing  Goal: Other (specify)  Outcome: Ongoing, Progressing  Goal: Patient will die peacefully and with dignity according to personal wishes  Outcome: Ongoing, Progressing  Goal: Understanding and preparedness leading up to death  Outcome: Ongoing, Progressing   Goal Outcome Evaluation:

## 2021-04-16 NOTE — Progress Notes (Signed)
[name] [facility] AT [facility]  [place] Medicine [facility] at [facility]  [address]  [place]  [place] [id number]  Dept: [phone]  Dept Fax: [phone]       Chief Complaint: Pain, anxiety     BMW:[id number]:Tyler Waldemar DickensPilato Jr.  88 y.o. male with diagnosis of metastatic prostate cancer.Family notes diagnosis of prostate cancer in 2006 with bone metastasis in 2019. Had been doing relatively well up until June 2022 when he had a CVA with minimal residual effect. Found to have elevated PSA. Presented to the ED with increased pain, ultimately electing to transition to comfort focused care. [facility] for management of pain, anxiety 04/14/2021.      At time of this visit patient unable to provide history. He is requiring Dilaudid 1 mg IV q4h and Ativan 1 mg IV q4h for management of pain, anxiety. Dosing increased yesterday. Required Dilaudid 1 mg IV prn in addition to schedule this morning prior to care due to increased discomfort noted with care.      No Known Allergies      Current Facility-Administered Medications   Medication Dose Route Frequency Provider Last Rate Last Admin   . acetaminophen (Tylenol) suppository 650 mg  650 mg rectal q6h PRN Lenice LlamasAnna Starlynn Klinkner, NP       . bisacodyl (Dulcolax) suppository 10 mg  10 mg rectal Daily PRN Lenice LlamasAnna Alik Mawson, NP   10 mg at 04/13/21 2258   . glycopyrrolate (PF) syringe 0.2-0.4 mg  0.2-0.4 mg subcutaneous q4h PRN Lenice LlamasAnna Tawny Raspberry, NP   0.2 mg at 04/14/21 2355   . haloperidol lactate (Haldol) injection 1-2 mg  1-2 mg intravenous q2h PRN Lenice LlamasAnna Zaila Crew, NP       . HYDROmorphone (Dilaudid) injection 0.5-1 mg  0.5-1 mg intravenous q1h PRN Lenice LlamasAnna Rayleigh Gillyard, NP   1 mg at 04/16/21 0815   . HYDROmorphone (Dilaudid) injection 1 mg  1 mg intravenous q4h Lenice LlamasAnna Jamyiah Labella, NP   1 mg at 04/16/21 1127   . LORazepam (Ativan) injection 0.5-1 mg  0.5-1 mg intravenous q2h PRN Lenice LlamasAnna Luccia Reinheimer, NP   1 mg at 04/15/21 0747   . LORazepam (Ativan) injection 1 mg  1 mg intravenous q4h Lenice LlamasAnna Jonnatan Hanners, NP   1 mg at  04/16/21 1127         PE:  BP 113/64 (BP Location: Left arm, Patient Position: Lying)   Pulse 100   Temp (!) 35.9 C (96.6 F) (Temporal)   Resp 14   SpO2 (!) 91%    PPS 10%    General: pale, lying in bed with no response to exam  Resp: Lung sounds diminished throughout  Cardiac: NL S1S2  GI: Abdomen non-distended, non tender  Ext: + pedal pulses  Neuro: Unable to follow commands     Assessment:   88 y.o. male with metastatic prostate cancer. Prognosis likely hours to days if continues on current trajectory.     LEVEL OF CARE:    This patient is GIP level of care for the management of pain, anxiety with use of IV medication. The patient continues to require frequent RN/NP/MD assessment, plan of care adjustments and titration of medications in an inpatient setting to control symptoms.    PLAN:  Pain:  Dilaudid 1 mg IV q4h  Dilaudid 0.5-1 mg IV qh prn pain/dyspnea    Anxiety:  Ativan 1 mg IV q4h  Ativan 0.5-1 mg IV q2h prn anxiety/agitation       Plan of care reviewed  and discussed with the care team at interdisciplinary team rounds.    Time In: 11a  Time Out: 1130a  Total time of visit: 30 minutes, with greater than 50% of the time spent on counseling and coordination of patient care.

## 2021-04-17 ENCOUNTER — Other Ambulatory Visit

## 2021-04-17 ENCOUNTER — Ambulatory Visit: Admit: 2021-04-17 | Payer: MEDICARE | Primary: Family Medicine

## 2021-04-17 ENCOUNTER — Other Ambulatory Visit: Admitting: Family

## 2021-04-17 DIAGNOSIS — Z20822 Contact with and (suspected) exposure to covid-19: Secondary | ICD-10-CM

## 2021-04-17 LAB — SARS COV 2 RNA BY PCR: COVID-19 (SARS-CoV-2 PCR): NOT DETECTED

## 2021-04-17 MED ORDER — LORazepam (Ativan) injection 1-2 mg
2 | INTRAMUSCULAR | Status: DC | PRN
Start: 2021-04-17 — End: 2021-04-19
  Administered 2021-04-18 – 2021-04-19 (×3): 1 mg via INTRAVENOUS

## 2021-04-17 MED ORDER — HYDROmorphone (Dilaudid) 2 mg/mL injection
2 | INTRAMUSCULAR | 0 refills | Status: AC | PRN
Start: 2021-04-17 — End: ?

## 2021-04-17 MED ORDER — HYDROmorphone (Dilaudid) 2 mg/mL injection
2 | INTRAMUSCULAR | 0 refills | Status: AC
Start: 2021-04-17 — End: ?

## 2021-04-17 MED ORDER — HYDROmorphone (Dilaudid) 2 mg/mL injection
2 | INTRAMUSCULAR | 0 refills | Status: DC
Start: 2021-04-17 — End: 2021-04-17

## 2021-04-17 MED ORDER — HYDROmorphone (Dilaudid) injection 2 mg
2 | INTRAMUSCULAR | Status: DC
Start: 2021-04-17 — End: 2021-04-19
  Administered 2021-04-17 – 2021-04-19 (×8): 2 mg via INTRAVENOUS

## 2021-04-17 MED ORDER — HYDROmorphone (Dilaudid) injection 1-2 mg
2 | INTRAMUSCULAR | Status: DC | PRN
Start: 2021-04-17 — End: 2021-04-19
  Administered 2021-04-18 (×2): 2 mg via INTRAVENOUS

## 2021-04-17 MED ORDER — glycopyrrolate, PF, 1 mg/5 mL (0.2 mg/mL) syringe
1 | INTRAMUSCULAR | 0 refills | Status: AC | PRN
Start: 2021-04-17 — End: ?

## 2021-04-17 MED ORDER — glycopyrrolate, PF, 1 mg/5 mL (0.2 mg/mL) syringe
1 | INTRAMUSCULAR | 0 refills | Status: DC | PRN
Start: 2021-04-17 — End: 2021-04-17

## 2021-04-17 MED ORDER — LORazepam (Ativan) 2 mg/mL injection
2 | INTRAMUSCULAR | 0 refills | 15.00000 days | Status: AC | PRN
Start: 2021-04-17 — End: ?

## 2021-04-17 NOTE — Progress Notes (Signed)
[facility] AT [facility] Medicine Care at [facility]  [address]  [address]  [place] [address]  Dept: [phone]  Dept Fax: [phone]       Chief Complaint: Pain, anxiety     [name] [name].  88 y.o. male with diagnosis of metastatic prostate cancer.Family notes diagnosis of prostate cancer in 2006 with bone metastasis in 2019. Had been doing relatively well up until June 2022 when he had a CVA with minimal residual effect. Found to have elevated PSA. Presented to the ED with increased pain, ultimately electing to transition to comfort focused care. Transferred to [facility] GIP level of care for management of pain, anxiety 04/15/2021.Has required increasing scheduled and PRN medication doses of IV medications since arrival to [facility]    At time of this visit patient is unable to provide review of systems.  He is receiving Dilaudid 1 mg IV every 4 hours and required Dilaudid 1 mg IV x4 in addition to scheduled dosing in the last 24 hours.  Of note nursing staff and patient's family note that he is beginning to breakthrough approximately 1 hour prior to next Scheduled dose.  Additionally he is receiving Ativan 1 mg IV every 4 hours for management of anxiety.  He has required 2 doses of glycopyrrolate for management of secretions in the last 24 hours.     No Known Allergies      Current Facility-Administered Medications   Medication Dose Route Frequency Provider Last Rate Last Admin   . acetaminophen (Tylenol) suppository 650 mg  650 mg rectal q6h PRN Lenice Llamas, NP       . bisacodyl (Dulcolax) suppository 10 mg  10 mg rectal Daily PRN Lenice Llamas, NP   10 mg at 04/13/21 2258   . glycopyrrolate (PF) syringe 0.2-0.4 mg  0.2-0.4 mg subcutaneous q4h PRN Lenice Llamas, NP   0.4 mg at 04/17/21 0600   . haloperidol lactate (Haldol) injection 1-2 mg  1-2 mg intravenous q2h PRN Lenice Llamas, NP       . HYDROmorphone (Dilaudid) injection 1-2 mg  1-2 mg intravenous q1h PRN Lenice Llamas, NP       . HYDROmorphone  (Dilaudid) injection 2 mg  2 mg intravenous q4h Lenice Llamas, NP       . LORazepam (Ativan) injection 1 mg  1 mg intravenous q4h Lenice Llamas, NP   1 mg at 04/17/21 1125   . LORazepam (Ativan) injection 1-2 mg  1-2 mg intravenous q2h PRN Lenice Llamas, NP             PE:  BP 115/62 (BP Location: Left arm, Patient Position: Lying)   Pulse 136   Temp 36.4 C (97.5 F) (Temporal)   Resp 14   SpO2 (!) 90%    PPS 10%  General: Pale, lying in bed with no response to exam  Resp: Lung sounds are diminished throughout, scattered rhonchi, noted to have gasping respirations  Cardiac: NL S1-S2, tachycardic  GI: Abdomen nondistended, hypoactive bowel sounds, nontender  Ext: + Pedal pulses  Neuro: Unable to follow commands     Assessment:   88 y.o. male with diagnosis of metastatic prostate cancer to the bone.  Increased pain likely in the setting of bone metastasis, prognosis hours to days if continues on current trajectory     LEVEL OF CARE:    This patient is GIP level of care for the management of pain and anxiety requiring increasing scheduled medication and as needed medication  dosing intravenously today. The patient continues to require frequent RN/NP/MD assessment, plan of care adjustments and titration of medications in an inpatient setting to control symptoms.    PLAN:  Pain  Increase to Dilaudid 1 mg IV every 4 hours  Increase to Dilaudid 1 to 2 mg IV every hour as needed for pain or dyspnea    Anxiety  Ativan 1 mg IV every 4 hours  Ativan 1 to 2 mg IV every 2 hours as needed anxiety agitation     Plan of care reviewed and discussed with the care team at interdisciplinary team rounds.    Time In: 930a  Time Out: 10a  Total time of visit: 30 minutes, with greater than 50% of the time spent on counseling and coordination of patient care.

## 2021-04-17 NOTE — Care Plan (Signed)
Problem: Adult Inpatient Plan of Care  Goal: Plan of Care Review  Outcome: Ongoing, Progressing  Goal: Patient-Specific Goal (Individualized)  Outcome: Ongoing, Progressing  Goal: Absence of Hospital-Acquired Illness or Injury  Outcome: Ongoing, Progressing  Goal: Optimal Comfort and Wellbeing  Outcome: Ongoing, Progressing  Goal: Readiness for Transition of Care  Outcome: Ongoing, Progressing     Problem: Supervision of the home health aide  Goal: Perform aide supervisory visit at minimum every 14 days  Outcome: Ongoing, Progressing     Problem: Alteration in mobility  Goal: Patient will remain as independent as possible and remain safe in their environment  Outcome: Ongoing, Progressing     Problem: Anticipatory Grief  Goal: Patient/caregiver/family will explore reactions to and verbalize acceptance of impending loss  Outcome: Ongoing, Progressing     Problem: Comfort deficit  Goal: Patient will report that pain has been reduced or controlled through verbal or nonverbal means and that measures to promote comfort are effective  Outcome: Ongoing, Progressing     Problem: Communication Deficit  Goal: Patient/caregiver/family will effectively communicate symptoms, needs and concerns  Outcome: Ongoing, Progressing     Problem: End of life process  Goal: Patient's caregiver will demonstrate understanding and acceptance of the end of life processes  Outcome: Ongoing, Progressing     Problem: Hospice orientation  Goal: The patient/family/caregiver will demonstrate understanding of the hospice philosophy  Outcome: Ongoing, Progressing     Problem: Anticipatory grief  Goal: Patient/caregiver/family will explore reactions to and verbalize acceptance of impending loss  Outcome: Ongoing, Progressing  Goal: Identified at risk bereavement family member/caregiver will receive pre-bereavement counseling services  Outcome: Ongoing, Progressing  Goal: Increased acceptance of impending death and accompanying losses caregivers will  express increased preparedness for impending loss  Outcome: Ongoing, Progressing     Problem: Dying Process  Goal: Patient/family increase peace with dying process  Outcome: Ongoing, Progressing  Goal: Patient coping identified  Outcome: Ongoing, Progressing  Goal: Patient and family expressed gratitude  Outcome: Ongoing, Progressing  Goal: Spiritual distress identified and decreased with visit  Outcome: Ongoing, Progressing  Goal: Other (specify)  Outcome: Ongoing, Progressing  Goal: Patient will die peacefully and with dignity according to personal wishes  Outcome: Ongoing, Progressing  Goal: Understanding and preparedness leading up to death  Outcome: Ongoing, Progressing     Problem: Spiritual Distress  Goal: Patient coping identified  Outcome: Ongoing, Progressing     Problem: Spiritual/Community Resources  Goal: Patient/family identify their beliefs/practices that support Hospice experience  Outcome: Ongoing, Progressing     Problem: IP hospice faith community connect  Goal: IP hospice patient/family is connected to local faith community/leaders  Outcome: Ongoing, Progressing   Goal Outcome Evaluation:

## 2021-04-18 ENCOUNTER — Other Ambulatory Visit

## 2021-04-18 MED ORDER — LORazepam (Ativan) 2 mg/mL injection
2 | INTRAMUSCULAR | 0 refills | 15.00000 days | Status: AC
Start: 2021-04-18 — End: ?

## 2021-04-18 NOTE — Progress Notes (Signed)
[place] [facility] AT [facility] Medicine Care at [facility]  [address]  [address]  [place] [address]  Dept: [phone]  Dept Fax: [phone]       Chief Complaint: Dyspnea, pain, anxiety     [id number] Aldridge Krzyzanowski.  88 y.o. male with diagnosis of metastatic prostate cancer.Family notes diagnosis of prostate cancer in 2006 with bone metastasis in 2019. Had been doing relatively well up until June 2022 when he had a CVA with minimal residual effect. Found to have elevated PSA. Presented to the ED with increased pain, ultimately electing to transition to comfort focused care. Transferred to [facility] GIP level of care for management of pain, anxiety 2021/05/03.Has required increasing scheduled and PRN medication doses of IV medications since arrival to [facility].     At time of this visit patient cannot provide ROS. He is noted to have ongoing s/s dyspnea and discomfort per his family. They note signs of abdominal breathing. Yesterday, dilaudid increased to 2 mg IV q4h for management of pain and dyspnea. For anxiety receiving Ativan 1 mg IV q4h. Intermittent upper airway congestion requiring use of glycopyrrolate prn secretions.      No Known Allergies      Current Facility-Administered Medications   Medication Dose Route Frequency Provider Last Rate Last Admin   . acetaminophen (Tylenol) suppository 650 mg  650 mg rectal q6h PRN Lenice Llamas, NP       . bisacodyl (Dulcolax) suppository 10 mg  10 mg rectal Daily PRN Lenice Llamas, NP   10 mg at 04/13/21 2258   . glycopyrrolate (PF) syringe 0.2-0.4 mg  0.2-0.4 mg subcutaneous q4h PRN Lenice Llamas, NP   0.4 mg at 04/17/21 0600   . haloperidol lactate (Haldol) injection 1-2 mg  1-2 mg intravenous q2h PRN Lenice Llamas, NP       . HYDROmorphone (Dilaudid) injection 1-2 mg  1-2 mg intravenous q1h PRN Lenice Llamas, NP   2 mg at 05/07/2021 1430   . HYDROmorphone (Dilaudid) injection 2 mg  2 mg intravenous q4h Lenice Llamas, NP   2 mg at 04/26/2021 1220   . LORazepam (Ativan) injection  1 mg  1 mg intravenous q4h Lenice Llamas, NP   1 mg at 04/17/2021 1220   . LORazepam (Ativan) injection 1-2 mg  1-2 mg intravenous q2h PRN Lenice Llamas, NP   1 mg at 04/22/2021 1430         PE:  BP 104/55 (BP Location: Left arm, Patient Position: Lying)   Pulse 121   Temp 36.2 C (97.2 F) (Temporal)   Resp 14   SpO2 (!) 81%    PPS 10%    General: Pale, lying in bed without response to exam  Resp: Lung sounds diminished with scattered rhonchi  Cardiac: Hr irregularly irregular, tachycardic  GI: Abdomen nondistended, + BS, non tender  Ext: BLE cool to touch  Neuro: Unable to follow commands     Assessment:   88 y.o. male with metastatic prostate cancer to the bone, now with prognosis of hours to days if continues on current trajectory     LEVEL OF CARE:    This patient is GIP level of care for the management of pain, anxiety, dyspnea with use of IV medications both scheduled and PRN due to increased symptom burden despite escalating doses. The patient continues to require frequent RN/NP/MD assessment, plan of care adjustments and titration of medications in an inpatient setting to control symptoms.    PLAN:  Pain/dyspnea  Dilaudid 2 mg IV q4h  Dilaudid 1-2 mg IV qh prn pain/dyspnea    Anxiety:  Ativan 1 mg IV q4h  Ativan 1-2 mg IV q2h prn anxiety/agitation    Secretions:  Glycopyrrolate 0.2-0.4 mg IV q4h prn secretions    Met with patient's family and reviewed prognostic outlook and what to expect as patient approaches end of life. Will benefit from ongoing support from IDT     Plan of care reviewed and discussed with the care team at interdisciplinary team rounds.    Time In: 930a  Time Out: 10a  Total time of visit: 30 minutes, with greater than 50% of the time spent on counseling and coordination of patient care.

## 2021-04-18 NOTE — Care Plan (Signed)
Goal Outcome Evaluation:

## 2021-04-22 ENCOUNTER — Telehealth

## 2021-04-22 ENCOUNTER — Other Ambulatory Visit: Admitting: Family

## 2021-04-24 ENCOUNTER — Telehealth: Payer: Self-pay | Admitting: Internal Medicine

## 2021-04-24 ENCOUNTER — Encounter (HOSPICE_FACILITY)

## 2021-04-24 NOTE — Chronic Care Management (AMB) (Signed)
°  Chronic Care Management   Outreach Note  04/24/2021 Name: Logan Brown. MRN: [id number] DOB: February 22, 1932  Referred by: [name], MD Reason for referral : Chronic Care Management   An unsuccessful telephone outreach was attempted today. The patient was referred to the pharmacist for assistance with care management and care coordination.   Follow Up Plan:   Tatjana Dellinger Upstream Scheduler

## 2021-04-27 ENCOUNTER — Other Ambulatory Visit: Payer: Self-pay | Admitting: Cardiology

## 2021-04-27 ENCOUNTER — Other Ambulatory Visit: Payer: Self-pay | Admitting: Internal Medicine

## 2021-04-27 DIAGNOSIS — I480 Paroxysmal atrial fibrillation: Secondary | ICD-10-CM

## 2021-04-27 DIAGNOSIS — K219 Gastro-esophageal reflux disease without esophagitis: Secondary | ICD-10-CM

## 2021-04-27 DIAGNOSIS — I10 Essential (primary) hypertension: Secondary | ICD-10-CM

## 2021-04-29 ENCOUNTER — Telehealth: Payer: Self-pay | Admitting: Internal Medicine

## 2021-04-29 NOTE — Chronic Care Management (AMB) (Signed)
°  Chronic Care Management   Outreach Note  04/29/2021 Name: Raza Bayless. MRN: [id number] DOB: 1931-05-04  Referred by: [name], MD Reason for referral : No chief complaint on file.   A second unsuccessful telephone outreach was attempted today. The patient was referred to pharmacist for assistance with care management and care coordination.  Follow Up Plan:   Tatjana Dellinger Upstream Scheduler

## 2021-04-29 NOTE — Hospice (Signed)
Condolence call made by Knox Salivaoula Angela Sakakeeny. Offered condolences to [employer]. Okey RegalCarol said it was hard losing a father and a sister within five days. Her father was being laid to rest tomorrow. Family is grateful for all the help and support that was offered. Marylene Landngela let them know of bereavement support down the road if desired.

## 2021-04-29 NOTE — Hospice (Signed)
Initial condolence call done by bereavement counselor Susan Bailey to assess bereavement risk

## 2021-04-30 ENCOUNTER — Encounter: Payer: MEDICARE | Attending: Nurse Practitioner | Primary: Family Medicine

## 2021-05-03 ENCOUNTER — Telehealth: Payer: Self-pay | Admitting: Internal Medicine

## 2021-05-03 NOTE — Chronic Care Management (AMB) (Signed)
°  Chronic Care Management   Outreach Note  05/03/2021 Name: Jonell Krontz. MRN: [id number] DOB: 04-20-1931  Referred by: [name], MD Reason for referral : No chief complaint on file.   Third unsuccessful telephone outreach was attempted today. The patient was referred to the pharmacist for assistance with care management and care coordination.   Follow Up Plan:   Tatjana Dellinger Upstream Scheduler

## 2021-05-08 DEATH — deceased

## 2021-05-15 NOTE — Hospice (Addendum)
88 year old male, transfer from [facility]. Admitted to [facility] on 05/04/2021 for GIP level of care for anxiety, pain, nausea, management of iv medication. Primary dx: metastatic prostate ca to bone. PMH: chf, htn, aflutter, cva, Pt is non responsive. Grimacing, moaning with care/movement. Secretions, congestion note, utilizing prn Glycopyrrolate for same. No po intake. Bedbound, foley in place. Meds via SC. On scheduled Dilaudid 1mg  sc q4hours, Ativan 1mg  sc q4hours.   PRN in 24 hours: Dilaudid 5mg , Glycopyrrolate 0.4mg  x 2, 0.2 mgx1.  Patient's admission discussed in IDT including complete med review.  Patient is appropriate for hospice services at this time and no changes are needed to admission POC.    Mamie Laurel., NP to increase Dilaudid to 2mg  q4hours.

## 2021-05-15 NOTE — Hospice (Addendum)
Level of Care:Pt admitted to [facility] GIP LOC on 2/3 from [facility].   Family/Social History/Dynamics: Pt is widowed, with once 8 adult children but now has only 5 surviving. Pt's oldest dtr passed away in her sleep this weekend. Family is grieving over this unexpected loss.   Funeral Plans: (Burial/ Cremation): [facility] [facility]  Directives in place: MOLST HCP  Insurance: Medicare  Discharge arrangements: remain at the [facility] as long as covered by insurance  Veteran: [employer]  Goals: comfort measures  Concerns: none at this time  Pre-Bereavement Risk: Low/no risk     Ongoing visits to address: ongoing supportive presence, anticipatory grief support, teaching of signs of decline and EOL process

## 2021-05-15 NOTE — Hospice (Addendum)
Visit with pt alone in his room. He was sound asleep. Gifted him with a prayer shawl. Prayed for him and his family. Found family sitting by the fire. 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  He has 8 children and oldest one just died.  Family was grateful to learn about bereavement services as they feel they are carrying a lot of grif right now.  They were glad to know I had prayed for him and his family.  Glad he was honored for  serving the [place]. Family says that [name] was a wonderful father.  [name] said when she told him that he was a wonderful father he had tears streaming down his face.

## 2021-06-24 ENCOUNTER — Encounter: Payer: MEDICARE | Primary: Family Medicine

## 2021-09-20 ENCOUNTER — Encounter: Payer: Self-pay | Admitting: Cardiology

## 2021-09-23 ENCOUNTER — Encounter: Payer: MEDICARE | Primary: Family Medicine

## 2021-12-23 ENCOUNTER — Encounter: Payer: MEDICARE | Primary: Family Medicine

## 2022-02-05 ENCOUNTER — Ambulatory Visit: Payer: Medicare HMO | Admitting: Internal Medicine
